# Patient Record
Sex: Female | Born: 1965 | Race: White | Hispanic: No | Marital: Married | Smoking: Current every day smoker
Health system: Southern US, Community
[De-identification: ages and names within clinical notes are randomized; demographics above are authoritative.]

## PROBLEM LIST (undated history)

## (undated) DIAGNOSIS — M150 Primary generalized (osteo)arthritis: Secondary | ICD-10-CM

## (undated) DIAGNOSIS — G43909 Migraine, unspecified, not intractable, without status migrainosus: Secondary | ICD-10-CM

## (undated) DIAGNOSIS — Z9889 Other specified postprocedural states: Secondary | ICD-10-CM

## (undated) DIAGNOSIS — Z86718 Personal history of other venous thrombosis and embolism: Secondary | ICD-10-CM

## (undated) DIAGNOSIS — E119 Type 2 diabetes mellitus without complications: Secondary | ICD-10-CM

## (undated) DIAGNOSIS — I729 Aneurysm of unspecified site: Secondary | ICD-10-CM

## (undated) DIAGNOSIS — F419 Anxiety disorder, unspecified: Secondary | ICD-10-CM

## (undated) DIAGNOSIS — R066 Hiccough: Secondary | ICD-10-CM

## (undated) DIAGNOSIS — C50919 Malignant neoplasm of unspecified site of unspecified female breast: Secondary | ICD-10-CM

## (undated) DIAGNOSIS — G629 Polyneuropathy, unspecified: Secondary | ICD-10-CM

## (undated) DIAGNOSIS — C801 Malignant (primary) neoplasm, unspecified: Secondary | ICD-10-CM

## (undated) DIAGNOSIS — I10 Essential (primary) hypertension: Secondary | ICD-10-CM

## (undated) DIAGNOSIS — K219 Gastro-esophageal reflux disease without esophagitis: Secondary | ICD-10-CM

## (undated) DIAGNOSIS — F32A Depression, unspecified: Secondary | ICD-10-CM

## (undated) DIAGNOSIS — C50412 Malignant neoplasm of upper-outer quadrant of left female breast: Principal | ICD-10-CM

## (undated) DIAGNOSIS — M7500 Adhesive capsulitis of unspecified shoulder: Secondary | ICD-10-CM

## (undated) DIAGNOSIS — J329 Chronic sinusitis, unspecified: Secondary | ICD-10-CM

## (undated) DIAGNOSIS — K5900 Constipation, unspecified: Secondary | ICD-10-CM

## (undated) DIAGNOSIS — R351 Nocturia: Secondary | ICD-10-CM

## (undated) DIAGNOSIS — I82409 Acute embolism and thrombosis of unspecified deep veins of unspecified lower extremity: Secondary | ICD-10-CM

## (undated) DIAGNOSIS — K8020 Calculus of gallbladder without cholecystitis without obstruction: Secondary | ICD-10-CM

## (undated) DIAGNOSIS — M549 Dorsalgia, unspecified: Secondary | ICD-10-CM

## (undated) DIAGNOSIS — I82A11 Acute embolism and thrombosis of right axillary vein: Secondary | ICD-10-CM

## (undated) DIAGNOSIS — K649 Unspecified hemorrhoids: Secondary | ICD-10-CM

## (undated) DIAGNOSIS — G8929 Other chronic pain: Secondary | ICD-10-CM

## (undated) DIAGNOSIS — A6920 Lyme disease, unspecified: Secondary | ICD-10-CM

## (undated) DIAGNOSIS — E7800 Pure hypercholesterolemia, unspecified: Secondary | ICD-10-CM

## (undated) DIAGNOSIS — B3731 Acute candidiasis of vulva and vagina: Secondary | ICD-10-CM

## (undated) DIAGNOSIS — R748 Abnormal levels of other serum enzymes: Secondary | ICD-10-CM

## (undated) DIAGNOSIS — F418 Other specified anxiety disorders: Secondary | ICD-10-CM

## (undated) DIAGNOSIS — R110 Nausea: Secondary | ICD-10-CM

## (undated) DIAGNOSIS — E559 Vitamin D deficiency, unspecified: Secondary | ICD-10-CM

## (undated) DIAGNOSIS — Z794 Long term (current) use of insulin: Secondary | ICD-10-CM

## (undated) DIAGNOSIS — Z1239 Encounter for other screening for malignant neoplasm of breast: Secondary | ICD-10-CM

## (undated) DIAGNOSIS — M47812 Spondylosis without myelopathy or radiculopathy, cervical region: Secondary | ICD-10-CM

## (undated) DIAGNOSIS — G8928 Other chronic postprocedural pain: Secondary | ICD-10-CM

## (undated) DIAGNOSIS — N951 Menopausal and female climacteric states: Secondary | ICD-10-CM

## (undated) DIAGNOSIS — N6489 Other specified disorders of breast: Secondary | ICD-10-CM

## (undated) DIAGNOSIS — F3289 Other specified depressive episodes: Secondary | ICD-10-CM

## (undated) DIAGNOSIS — J0140 Acute pansinusitis, unspecified: Secondary | ICD-10-CM

## (undated) DIAGNOSIS — N6320 Unspecified lump in the left breast, unspecified quadrant: Secondary | ICD-10-CM

## (undated) DIAGNOSIS — M5416 Radiculopathy, lumbar region: Secondary | ICD-10-CM

## (undated) DIAGNOSIS — IMO0002 Reserved for concepts with insufficient information to code with codable children: Secondary | ICD-10-CM

## (undated) DIAGNOSIS — Z170 Estrogen receptor positive status [ER+]: Secondary | ICD-10-CM

## (undated) DIAGNOSIS — M542 Cervicalgia: Secondary | ICD-10-CM

## (undated) DIAGNOSIS — R809 Proteinuria, unspecified: Secondary | ICD-10-CM

## (undated) DIAGNOSIS — F17200 Nicotine dependence, unspecified, uncomplicated: Secondary | ICD-10-CM

## (undated) DIAGNOSIS — E1129 Type 2 diabetes mellitus with other diabetic kidney complication: Secondary | ICD-10-CM

## (undated) DIAGNOSIS — M48062 Spinal stenosis, lumbar region with neurogenic claudication: Secondary | ICD-10-CM

## (undated) DIAGNOSIS — Z421 Encounter for breast reconstruction following mastectomy: Secondary | ICD-10-CM

## (undated) DIAGNOSIS — R928 Other abnormal and inconclusive findings on diagnostic imaging of breast: Secondary | ICD-10-CM

## (undated) DIAGNOSIS — C50012 Malignant neoplasm of nipple and areola, left female breast: Secondary | ICD-10-CM

## (undated) DIAGNOSIS — Z853 Personal history of malignant neoplasm of breast: Secondary | ICD-10-CM

## (undated) DIAGNOSIS — C50912 Malignant neoplasm of unspecified site of left female breast: Secondary | ICD-10-CM

## (undated) DIAGNOSIS — M545 Low back pain, unspecified: Secondary | ICD-10-CM

## (undated) DIAGNOSIS — N641 Fat necrosis of breast: Secondary | ICD-10-CM

## (undated) DIAGNOSIS — N63 Unspecified lump in unspecified breast: Secondary | ICD-10-CM

## (undated) HISTORY — PX: HX BACK SURGERY: SHX140

## (undated) HISTORY — PX: SIMPLE MASTECTOMY: SHX2409

## (undated) HISTORY — PX: SHOULDER ADHESION RELEASE: SHX773

## (undated) HISTORY — PX: HAND SURGERY: SHX662

## (undated) HISTORY — PX: HX GALL BLADDER SURGERY/CHOLE: SHX55

## (undated) HISTORY — PX: HX BREAST RECONSTRUCTION: SHX9

## (undated) HISTORY — DX: Acute embolism and thrombosis of unspecified deep veins of unspecified lower extremity: I82.409

## (undated) HISTORY — DX: Calculus of gallbladder without cholecystitis without obstruction: K80.20

## (undated) HISTORY — DX: Acute embolism and thrombosis of right axillary vein: I82.A11

## (undated) HISTORY — PX: OTHER SURGICAL HISTORY: SHX169

## (undated) HISTORY — DX: Malignant neoplasm of unspecified site of unspecified female breast: C50.919

## (undated) HISTORY — PX: BREAST SURGERY: SHX581

## (undated) HISTORY — PX: TUBAL LIGATION: SHX77

## (undated) HISTORY — PX: BACK SURGERY: SHX140

---

## 1992-10-27 ENCOUNTER — Other Ambulatory Visit (HOSPITAL_COMMUNITY): Payer: Self-pay | Admitting: EXTERNAL

## 2008-10-14 DIAGNOSIS — C50919 Malignant neoplasm of unspecified site of unspecified female breast: Secondary | ICD-10-CM

## 2008-10-14 HISTORY — DX: Malignant neoplasm of unspecified site of unspecified female breast: C50.919

## 2009-10-14 HISTORY — PX: OTHER SURGICAL HISTORY: SHX169

## 2010-04-27 HISTORY — PX: MASTECTOMY: SHX3

## 2010-06-02 ENCOUNTER — Emergency Department (HOSPITAL_COMMUNITY): Admission: EM | Admit: 2010-06-02 | Discharge: 2010-06-03 | Payer: Self-pay | Admitting: Emergency Medicine

## 2010-08-15 ENCOUNTER — Emergency Department (HOSPITAL_COMMUNITY): Admission: EM | Admit: 2010-08-15 | Discharge: 2010-08-15 | Payer: Self-pay | Admitting: Emergency Medicine

## 2010-08-31 ENCOUNTER — Emergency Department (HOSPITAL_COMMUNITY): Admission: EM | Admit: 2010-08-31 | Discharge: 2010-08-31 | Payer: Self-pay | Admitting: Emergency Medicine

## 2010-11-11 ENCOUNTER — Emergency Department (HOSPITAL_COMMUNITY)
Admission: EM | Admit: 2010-11-11 | Discharge: 2010-11-11 | Payer: Self-pay | Source: Home / Self Care | Admitting: Emergency Medicine

## 2010-11-11 LAB — URINALYSIS, ROUTINE W REFLEX MICROSCOPIC
Ketones, ur: 15 mg/dL — AB
Leukocytes, UA: NEGATIVE
Nitrite: NEGATIVE
Urobilinogen, UA: 1 mg/dL (ref 0.0–1.0)
pH: 6 (ref 5.0–8.0)

## 2010-11-11 LAB — URINE MICROSCOPIC-ADD ON

## 2010-11-11 LAB — POCT PREGNANCY, URINE: Preg Test, Ur: NEGATIVE

## 2010-12-11 ENCOUNTER — Emergency Department (HOSPITAL_COMMUNITY)
Admission: EM | Admit: 2010-12-11 | Discharge: 2010-12-11 | Disposition: A | Discharge status: Home / Self Care | Payer: BC Managed Care – PPO | Attending: Emergency Medicine | Admitting: Emergency Medicine

## 2010-12-11 ENCOUNTER — Emergency Department (HOSPITAL_COMMUNITY): Payer: BC Managed Care – PPO

## 2010-12-11 DIAGNOSIS — Z79899 Other long term (current) drug therapy: Secondary | ICD-10-CM | POA: Insufficient documentation

## 2010-12-11 DIAGNOSIS — Z853 Personal history of malignant neoplasm of breast: Secondary | ICD-10-CM | POA: Insufficient documentation

## 2010-12-11 DIAGNOSIS — R071 Chest pain on breathing: Secondary | ICD-10-CM | POA: Insufficient documentation

## 2010-12-11 DIAGNOSIS — E119 Type 2 diabetes mellitus without complications: Secondary | ICD-10-CM | POA: Insufficient documentation

## 2010-12-11 LAB — POCT PREGNANCY, URINE: Preg Test, Ur: NEGATIVE

## 2011-01-23 ENCOUNTER — Emergency Department (HOSPITAL_COMMUNITY): Payer: Managed Care, Other (non HMO)

## 2011-01-23 ENCOUNTER — Emergency Department (HOSPITAL_COMMUNITY)
Admission: EM | Admit: 2011-01-23 | Discharge: 2011-01-23 | Disposition: A | Discharge status: Home / Self Care | Payer: Managed Care, Other (non HMO) | Attending: Emergency Medicine | Admitting: Emergency Medicine

## 2011-01-23 DIAGNOSIS — E860 Dehydration: Secondary | ICD-10-CM | POA: Insufficient documentation

## 2011-01-23 DIAGNOSIS — R0789 Other chest pain: Secondary | ICD-10-CM | POA: Insufficient documentation

## 2011-01-23 DIAGNOSIS — K8020 Calculus of gallbladder without cholecystitis without obstruction: Secondary | ICD-10-CM | POA: Insufficient documentation

## 2011-01-23 DIAGNOSIS — E119 Type 2 diabetes mellitus without complications: Secondary | ICD-10-CM | POA: Insufficient documentation

## 2011-01-23 LAB — DIFFERENTIAL
Basophils Absolute: 0 10*3/uL (ref 0.0–0.1)
Basophils Relative: 0 % (ref 0–1)
Eosinophils Relative: 0 % (ref 0–5)
Monocytes Absolute: 0.8 10*3/uL (ref 0.1–1.0)
Neutrophils Relative %: 68 % (ref 43–77)

## 2011-01-23 LAB — BASIC METABOLIC PANEL
BUN: 4 mg/dL — ABNORMAL LOW (ref 6–23)
CO2: 22 mEq/L (ref 19–32)
Calcium: 9.5 mg/dL (ref 8.4–10.5)
Creatinine, Ser: 0.41 mg/dL (ref 0.4–1.2)
Glucose, Bld: 221 mg/dL — ABNORMAL HIGH (ref 70–99)
Sodium: 139 mEq/L (ref 135–145)

## 2011-01-23 LAB — CBC
MCHC: 36.9 g/dL — ABNORMAL HIGH (ref 30.0–36.0)
Platelets: 275 10*3/uL (ref 150–400)
RDW: 12.2 % (ref 11.5–15.5)

## 2011-01-23 LAB — URINALYSIS, ROUTINE W REFLEX MICROSCOPIC
Bilirubin Urine: NEGATIVE
Hgb urine dipstick: NEGATIVE
Specific Gravity, Urine: 1.019 (ref 1.005–1.030)
Urobilinogen, UA: 1 mg/dL (ref 0.0–1.0)

## 2011-01-23 LAB — POCT CARDIAC MARKERS: Myoglobin, poc: 34.5 ng/mL (ref 12–200)

## 2011-01-23 LAB — URINE MICROSCOPIC-ADD ON

## 2011-01-23 MED ORDER — IOHEXOL 300 MG/ML  SOLN
80.0000 mL | Freq: Once | INTRAMUSCULAR | Status: AC | PRN
  Administered 2011-01-23: 80 mL via INTRAVENOUS

## 2011-04-14 HISTORY — PX: BREAST RECONSTRUCTION: SHX9

## 2011-09-26 ENCOUNTER — Ambulatory Visit (INDEPENDENT_AMBULATORY_CARE_PROVIDER_SITE_OTHER): Payer: Managed Care, Other (non HMO)

## 2011-09-26 DIAGNOSIS — J019 Acute sinusitis, unspecified: Secondary | ICD-10-CM

## 2011-09-26 DIAGNOSIS — Z7721 Contact with and (suspected) exposure to potentially hazardous body fluids: Secondary | ICD-10-CM

## 2012-01-04 ENCOUNTER — Ambulatory Visit (INDEPENDENT_AMBULATORY_CARE_PROVIDER_SITE_OTHER): Payer: Managed Care, Other (non HMO) | Admitting: Family Medicine

## 2012-01-04 DIAGNOSIS — R5383 Other fatigue: Secondary | ICD-10-CM

## 2012-01-04 DIAGNOSIS — R5381 Other malaise: Secondary | ICD-10-CM

## 2012-01-04 DIAGNOSIS — R232 Flushing: Secondary | ICD-10-CM

## 2012-01-04 DIAGNOSIS — N951 Menopausal and female climacteric states: Secondary | ICD-10-CM

## 2012-01-04 DIAGNOSIS — E119 Type 2 diabetes mellitus without complications: Secondary | ICD-10-CM

## 2012-01-04 LAB — POCT CBC
Granulocyte percent: 65.4 % (ref 37–80)
HCT, POC: 46 % (ref 37.7–47.9)
Hemoglobin: 15.5 g/dL (ref 12.2–16.2)
Lymph, poc: 2.9 (ref 0.6–3.4)
MCH, POC: 31.5 pg — AB (ref 27–31.2)
MCHC: 33.7 g/dL (ref 31.8–35.4)
MCV: 93.4 fL (ref 80–97)
MID (cbc): 0.7 (ref 0–0.9)
MPV: 9.1 fL (ref 0–99.8)
POC Granulocyte: 6.9 (ref 2–6.9)
POC LYMPH PERCENT: 27.5 % (ref 10–50)
POC MID %: 7.1 % (ref 0–12)
Platelet Count, POC: 225 10*3/uL (ref 142–424)
RBC: 4.92 M/uL (ref 4.04–5.48)
RDW, POC: 12.6 %
WBC: 10.5 10*3/uL — AB (ref 4.6–10.2)

## 2012-01-04 LAB — POCT GLYCOSYLATED HEMOGLOBIN (HGB A1C): Hemoglobin A1C: 10

## 2012-01-04 MED ORDER — SAXAGLIPTIN-METFORMIN ER 5-1000 MG PO TB24: 1.0000 | ORAL_TABLET | Freq: Every day | ORAL | Status: DC

## 2012-01-04 NOTE — Progress Notes (Signed)
[facility]:  Office Visit  Chief Complaint:  Chief Complaint  Patient presents with  . Fatigue    2 weeks  . Hot Flashes  . Nausea    HPI: Sherry [name] is a 46 y.o. female who complains of : 1. Fatigue x 2 weeks, has prior dx of depression, usually situational. Denies SI/HI 2. T2DM-non compliant due to issues with insurance. Metformin XR 1000 mg daily or something like that , she is unsure. 3. Hot flashes-associated with tearing spells, irritability-LMP Nov 29, 2010.  3. Elevated BP-no dx of HTN, at home 120s/70s  Past Medical History  Diagnosis Date  . Hyperlipidemia   . Hypertension   . Vitamin d deficiency   . Gallstones   . Breast cancer 2010    s/p mastectomy   Past Surgical History  Procedure Date  . Breast surgery   . Breast reconstruction    History   Social History  . Marital Status: Married    Spouse Name: N/A    Number of Children: N/A  . Years of Education: N/A   Social History Main Topics  . Smoking status: Current Everyday Smoker -- 0.5 packs/day    Types: Cigarettes  . Smokeless tobacco: None  . Alcohol Use: None  . Drug Use: None  . Sexually Active: None   Other Topics Concern  . None   Social History Narrative  . None   Family History  Problem Relation Age of Onset  . Diabetes Mother   . Diabetes Sister    No Known Allergies Prior to Admission medications   Not on File     ROS: The patient denies fevers, chills, night sweats, unintentional weight loss, chest pain, palpitations, wheezing, dyspnea on exertion, nausea, vomiting, abdominal pain, dysuria, hematuria, melena, numbness, weakness, or tingling. + hotf lashes  All other systems have been reviewed and were otherwise negative with the exception of those mentioned in the HPI and as above.    PHYSICAL EXAM: Filed Vitals:   01/04/12 1740  BP: 141/90  Pulse: 93  Temp: 98.3 F (36.8 C)  Resp: 16   Filed Vitals:   01/04/12 1740  Height: 5\' 2"  (1.575  m)  Weight: 154 lb (69.854 kg)   Body mass index is 28.17 kg/(m^2).  General: Alert, no acute distress, tearful HEENT:  Normocephalic, atraumatic, oropharynx patent. EOMI, PERRLA, fundoscopic exam nl Cardiovascular:  Regular rate and rhythm, no rubs murmurs or gallops.  No Carotid bruits, radial pulse intact. No pedal edema.  Respiratory: Clear to auscultation bilaterally.  No wheezes, rales, or rhonchi.  No cyanosis, no use of accessory musculature GI: No organomegaly, abdomen is soft and non-tender, positive bowel sounds.  No masses. Skin: No rashes. Neurologic: Facial musculature symmetric.Sensation in Evian Derringer bl intact Psychiatric: Patient is appropriate throughout our interaction. Lymphatic: No cervical lymphadenopathy Musculoskeletal: Gait intact.   LABS: Results for orders placed in visit on 01/04/12  POCT CBC      Component Value Range   WBC 10.5 (*) 4.6 - 10.2 (K/uL)   Lymph, poc 2.9  0.6 - 3.4    POC LYMPH PERCENT 27.5  10 - 50 (%L)   MID (cbc) 0.7  0 - 0.9    POC MID % 7.1  0 - 12 (%M)   POC Granulocyte 6.9  2 - 6.9    Granulocyte percent 65.4  37 - 80 (%G)   RBC 4.92  4.04 - 5.48 (M/uL)   Hemoglobin 15.5  12.2 - 16.2 (  g/dL)   HCT, POC 21.3  08.6 - 47.9 (%)   MCV 93.4  80 - 97 (fL)   MCH, POC 31.5 (*) 27 - 31.2 (pg)   MCHC 33.7  31.8 - 35.4 (g/dL)   RDW, POC 57.8     Platelet Count, POC 225  142 - 424 (K/uL)   MPV 9.1  0 - 99.8 (fL)  POCT GLYCOSYLATED HEMOGLOBIN (HGB A1C)      Component Value Range   Hemoglobin A1C 10.0       EKG/XRAY:   Primary read interpreted by Dr. Conley Rolls at [facility].   ASSESSMENT/PLAN: Encounter Diagnoses  Name Primary?  . Diabetes mellitus Yes  . Fatigue   . 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 Advise to get eyes checked.  Fatigue-check CBC, CMP, TSH Hot flashes-Check LH/FSH Recheck lipids in 3 months when fasting. NO meds for now since HDL was 107 in 08/2011  F/u in 3 months  with appt with PCP.  Hamilton Capri PHUONG, DO 01/04/2012 6:55 PM

## 2012-01-05 ENCOUNTER — Other Ambulatory Visit: Payer: Self-pay | Admitting: Family Medicine

## 2012-01-05 ENCOUNTER — Telehealth: Payer: Self-pay | Admitting: Family Medicine

## 2012-01-05 ENCOUNTER — Telehealth: Payer: Self-pay

## 2012-01-05 DIAGNOSIS — R9389 Abnormal findings on diagnostic imaging of other specified body structures: Secondary | ICD-10-CM

## 2012-01-05 DIAGNOSIS — M791 Myalgia, unspecified site: Secondary | ICD-10-CM

## 2012-01-05 LAB — COMPREHENSIVE METABOLIC PANEL WITH GFR
ALT: 32 U/L (ref 0–35)
AST: 26 U/L (ref 0–37)
Alkaline Phosphatase: 161 U/L — ABNORMAL HIGH (ref 39–117)
BUN: 11 mg/dL (ref 6–23)
Creat: 0.54 mg/dL (ref 0.50–1.10)
Total Bilirubin: 0.5 mg/dL (ref 0.3–1.2)

## 2012-01-05 LAB — LUTEINIZING HORMONE: LH: 12.8 m[IU]/mL

## 2012-01-05 LAB — COMPREHENSIVE METABOLIC PANEL
Albumin: 4.4 g/dL (ref 3.5–5.2)
CO2: 25 mEq/L (ref 19–32)
Calcium: 9.8 mg/dL (ref 8.4–10.5)
Chloride: 103 mEq/L (ref 96–112)
Glucose, Bld: 378 mg/dL — ABNORMAL HIGH (ref 70–99)
Potassium: 4.2 mEq/L (ref 3.5–5.3)
Sodium: 137 mEq/L (ref 135–145)
Total Protein: 7.3 g/dL (ref 6.0–8.3)

## 2012-01-05 LAB — FOLLICLE STIMULATING HORMONE: FSH: 20 m[IU]/mL

## 2012-01-05 LAB — TSH: TSH: 2.042 u[IU]/mL (ref 0.350–4.500)

## 2012-01-05 MED ORDER — TRAMADOL HCL 50 MG PO TABS: 50.0000 mg | ORAL_TABLET | Freq: Three times a day (TID) | ORAL | Status: DC | PRN

## 2012-01-05 NOTE — Telephone Encounter (Signed)
Filled rx for tramadol for msk pain [facility] [place]. Used to be on vicodin but I switched her because it might be better just for generalized msk pain.

## 2012-01-05 NOTE — Telephone Encounter (Signed)
PATIENT WAS SEEN YESTERDAY AND WE CALLED HER IN HER DIABETIES MEDICINE AND WERE SUPPOSED TO CALL IN HER TRAMODALL ALSO.  SHE GOT HER DIABETIES MED, BUT THEY NEVER RECEIVED ANYTHING ON THE TRAMODALL.    [facility] [address]

## 2012-01-07 ENCOUNTER — Other Ambulatory Visit: Payer: Self-pay | Admitting: *Deleted

## 2012-01-07 MED ORDER — SAXAGLIPTIN-METFORMIN ER 5-1000 MG PO TB24: 1.0000 | ORAL_TABLET | Freq: Every day | ORAL | Status: DC

## 2012-01-09 ENCOUNTER — Ambulatory Visit (INDEPENDENT_AMBULATORY_CARE_PROVIDER_SITE_OTHER): Payer: Managed Care, Other (non HMO) | Admitting: Internal Medicine

## 2012-01-09 VITALS — BP 144/92 | HR 81 | Temp 97.9°F | Resp 18 | Ht 62.0 in | Wt 152.0 lb

## 2012-01-09 DIAGNOSIS — H9209 Otalgia, unspecified ear: Secondary | ICD-10-CM

## 2012-01-09 LAB — POCT CBC
Granulocyte percent: 58 %G (ref 37–80)
HCT, POC: 46.9 % (ref 37.7–47.9)
MCV: 92.8 fL (ref 80–97)
Platelet Count, POC: 244 10*3/uL (ref 142–424)
RBC: 5.05 M/uL (ref 4.04–5.48)

## 2012-01-09 LAB — GLUCOSE, POCT (MANUAL RESULT ENTRY): POC Glucose: 265

## 2012-01-09 MED ORDER — AMOXICILLIN-POT CLAVULANATE 875-125 MG PO TABS: 1.0000 | ORAL_TABLET | Freq: Two times a day (BID) | ORAL | Status: AC

## 2012-01-09 MED ORDER — CEFTRIAXONE SODIUM 1 G IJ SOLR
1.0000 g | Freq: Once | INTRAMUSCULAR | Status: AC
  Administered 2012-01-09: 1 g via INTRAMUSCULAR

## 2012-01-09 MED ORDER — KETOROLAC TROMETHAMINE 30 MG/ML IJ SOLN
30.0000 mg | Freq: Once | INTRAMUSCULAR | Status: AC
  Administered 2012-01-09: 30 mg via INTRAMUSCULAR

## 2012-01-09 MED ORDER — HYDROCODONE-ACETAMINOPHEN 5-325 MG PO TABS: 1.0000 | ORAL_TABLET | Freq: Four times a day (QID) | ORAL | Status: AC | PRN

## 2012-01-09 NOTE — Patient Instructions (Signed)
Get plenty of rest. Drink at least 64 ounces of water daily.

## 2012-01-09 NOTE — Progress Notes (Signed)
  Subjective:    Patient ID: Sherry [name], female    DOB: 1965/12/01, 46 y.o.   MRN: [id number]  HPI Presents accompanied by her husband, c/o severe left ear pain.  Woke her from sleep last night.  Notes some left sided ha with left side lying for several days.    Also, husband brings up pain in hip and legs for some time.  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  Was evaluated at a local orthopedics office and was told she'd have an MRI but reports she never heard anything else about it.  Unable to walk into [employer] from parking lot due to pain.  Seems worse since mastectomy.  Requests handicap placard.  Restarted Kombiglyze XR only 3 days ago (refilled 3/23).  Tramadol ineffective for her pain, causes insomnia.   Review of Systems As above.    Objective:   Physical Exam Vital signs noted. Well-developed, well nourished WF who is awake, alert and oriented, in obvious discomfort, crying. Husband is attentive to her, trying to soothe her HEENT: [place]/AT, PERRL, EOMI.  Sclera and conjunctiva are clear.  Mild fullness around the left ear and mastoid.  No erythema, induration.  Exquisite tenderness of the periauricular area, pinna and tragus.  No erythema/edema of the canal, moderate cerumen.  The TM appears normal.  On the Right, EAC is patent, TM is normal in appearance. Nasal mucosa is pink and moist. OP is clear. Neck: supple, non-tender, no lymphadenopathy, thyromegaly. Heart: RRR, no murmur Lungs: CTA Skin: warm and dry without rash.  Results for orders placed in visit on 01/09/12  POCT CBC      Component Value Range   WBC 10.6 (*) 4.6 - 10.2 (K/uL)   Lymph, poc 3.7 (*) 0.6 - 3.4    POC LYMPH PERCENT 35.3  10 - 50 (%L)   MID (cbc) 0.7  0 - 0.9    POC MID % 6.7  0 - 12 (%M)   POC Granulocyte 6.1  2 - 6.9    Granulocyte percent 58.0  37 - 80 (%G)   RBC 5.05  4.04 - 5.48 (M/uL)   Hemoglobin 15.5  12.2 - 16.2 (g/dL)   HCT, POC 40.9  81.1 - 47.9 (%)   MCV 92.8  80 - 97 (fL)    MCH, POC 30.7  27 - 31.2 (pg)   MCHC 33.0  31.8 - 35.4 (g/dL)   RDW, POC 91.4     Platelet Count, POC 244  142 - 424 (K/uL)   MPV 10.1  0 - 99.8 (fL)  GLUCOSE, POCT (MANUAL RESULT ENTRY)      Component Value Range   POC Glucose 265           Assessment & Plan:   1. Ear pain  POCT CBC, ketorolac (TORADOL) 30 MG/ML injection 30 mg, cefTRIAXone (ROCEPHIN) injection 1 g, amoxicillin-clavulanate (AUGMENTIN) 875-125 MG per tablet  2. Diabetes mellitus  POCT glucose (manual entry). Continue working on improved glucose control.    3. Back Pain.  Handicap placard (temporary, 6 months) provided    D/C Tramadol due to ineffectiveness and insomnia    Norco 5/325 1 PO Q6 hours, prn pain, #30 no refills    Once acute illness resolved, will pursue continued evaluation, which may    include MRI. Re-evaluate tomorrow morning.  May need urgent referral to ENT if not improved. Seen with Dr. Perrin Maltese.

## 2012-01-17 ENCOUNTER — Encounter: Payer: Self-pay | Admitting: Family Medicine

## 2012-01-29 ENCOUNTER — Ambulatory Visit: Payer: Managed Care, Other (non HMO)

## 2012-01-29 ENCOUNTER — Ambulatory Visit (INDEPENDENT_AMBULATORY_CARE_PROVIDER_SITE_OTHER): Payer: Managed Care, Other (non HMO) | Admitting: Family Medicine

## 2012-01-29 VITALS — BP 157/94 | HR 80 | Temp 98.3°F | Resp 18 | Ht 63.0 in | Wt 149.0 lb

## 2012-01-29 DIAGNOSIS — E119 Type 2 diabetes mellitus without complications: Secondary | ICD-10-CM

## 2012-01-29 DIAGNOSIS — K8020 Calculus of gallbladder without cholecystitis without obstruction: Secondary | ICD-10-CM

## 2012-01-29 DIAGNOSIS — R0789 Other chest pain: Secondary | ICD-10-CM

## 2012-01-29 DIAGNOSIS — R6521 Severe sepsis with septic shock: Secondary | ICD-10-CM

## 2012-01-29 DIAGNOSIS — A419 Sepsis, unspecified organism: Secondary | ICD-10-CM

## 2012-01-29 LAB — POCT GLYCOSYLATED HEMOGLOBIN (HGB A1C): Hemoglobin A1C: 9.7

## 2012-01-29 LAB — GLUCOSE, POCT (MANUAL RESULT ENTRY): POC Glucose: 190

## 2012-01-29 MED ORDER — OXAPROZIN 600 MG PO TABS: 600.0000 mg | ORAL_TABLET | Freq: Every day | ORAL | Status: DC

## 2012-01-29 MED ORDER — CYCLOBENZAPRINE HCL 5 MG PO TABS
ORAL_TABLET | ORAL | Status: DC
Start: 2012-01-29 — End: 2012-03-19

## 2012-01-29 MED ORDER — HYDROCODONE-ACETAMINOPHEN 10-325 MG PO TABS: 1.0000 | ORAL_TABLET | Freq: Three times a day (TID) | ORAL | Status: AC | PRN

## 2012-01-29 NOTE — Progress Notes (Signed)
Subjective: 46 year old female with history of right chest wall pain. This episode began yesterday after she and her daughter were playing bingo. She developed chest wall pain radiating around to the right back. Any movement the cause excruciating pain. She rates the pain 8/10 on a pain scale. She did not develop any nausea or vomiting or shortness of breath. She has no history of heart disease. She has a diabetic, who has poor control last time when she was out of her medications. She had a breast cancer 3 years ago with mastectomy, chemotherapy and radiation. She had a great deal of radiation burn scarring of her right chest. She ended up having a breast reconstruction done, but the tight skin cause this to excessive tightness of the reconstruction and implant. She also had a left reduction mammoplasty. Patient does also give a history of having a gallstone so we need to keep that in mind.  Objective: Alert and oriented, somewhat anxious. Neck supple without nodes. Chest is deformed from the old surgery and radiation and right-sided implant. There is some variation of the colors of the skin from the radiation. Implant is a little bit medial appearing scar tissues more lateral. The excision of the scar radiates around to the right mid back. She has tenderness over chest wall, more in the axillary [place] area. This abdomen soft without masses or tenderness. Heart regular without any murmurs.  EKG was done and was normal.  Assessment: Chest wall pain secondary to radiation and scar tissue History of breast cancer  Diabetes mellitus Gallstone history  Plan: Chest x-ray, glucose, hemoglobin A1c.  [facility] reading (PRIMARY) by  Dr. Alwyn Ren  increased markings in right lung consistent with old radiation scarring post mastectomy.  Results for orders placed in visit on 01/29/12  GLUCOSE, POCT (MANUAL RESULT ENTRY)      Component Value Range   POC Glucose 190    POCT GLYCOSYLATED HEMOGLOBIN (HGB A1C)   Component Value Range   Hemoglobin A1C 9.7     .

## 2012-01-29 NOTE — Patient Instructions (Signed)
Use the pain medications as necessary. Save the hydrocodone for severe pain only. Use the relaxants (Flexeril and (on an as needed basis.

## 2012-01-30 LAB — COMPREHENSIVE METABOLIC PANEL
ALT: 22 U/L (ref 0–35)
AST: 20 U/L (ref 0–37)
Alkaline Phosphatase: 135 U/L — ABNORMAL HIGH (ref 39–117)
Calcium: 9.3 mg/dL (ref 8.4–10.5)
Chloride: 106 mEq/L (ref 96–112)
Creat: 0.4 mg/dL — ABNORMAL LOW (ref 0.50–1.10)
Potassium: 4 mEq/L (ref 3.5–5.3)

## 2012-01-31 ENCOUNTER — Encounter: Payer: Self-pay | Admitting: Family Medicine

## 2012-02-01 ENCOUNTER — Encounter: Payer: Self-pay | Admitting: Family Medicine

## 2012-02-01 ENCOUNTER — Telehealth: Payer: Self-pay | Admitting: Family Medicine

## 2012-02-05 ENCOUNTER — Ambulatory Visit
Admission: RE | Admit: 2012-02-05 | Discharge: 2012-02-05 | Disposition: A | Discharge status: Home / Self Care | Payer: Managed Care, Other (non HMO) | Source: Ambulatory Visit | Attending: Family Medicine | Admitting: Family Medicine

## 2012-02-05 DIAGNOSIS — R9389 Abnormal findings on diagnostic imaging of other specified body structures: Secondary | ICD-10-CM

## 2012-02-06 ENCOUNTER — Telehealth: Payer: Self-pay

## 2012-02-06 NOTE — Telephone Encounter (Signed)
.  [facility] The patient called regarding results of her Head CT done 02/05/12. Please call patient at [phone].

## 2012-02-06 NOTE — Telephone Encounter (Signed)
Spoke with patient and let her know that results are not back yet.

## 2012-02-08 ENCOUNTER — Telehealth: Payer: Self-pay

## 2012-02-08 NOTE — Telephone Encounter (Signed)
Patient is calling for the third day in a row for her scan results.  She is scared and concerned.  Please call.

## 2012-02-08 NOTE — Telephone Encounter (Signed)
Pt is calling for ct results, states has called 2x and scan was done on wed Please call pt at [phone]

## 2012-02-08 NOTE — Telephone Encounter (Signed)
Ryan, pls review so I can advise patient.

## 2012-02-08 NOTE — Telephone Encounter (Signed)
No acute abnormalities. Chronic gall stones, no change from prior. Unchanged calcification of the liver.  Sherry [name]

## 2012-02-08 NOTE — Telephone Encounter (Signed)
Patient notified and reassured 

## 2012-02-10 ENCOUNTER — Telehealth: Payer: Self-pay | Admitting: Family Medicine

## 2012-02-10 NOTE — Telephone Encounter (Signed)
I left a message on the patient's machine that I was sorry that I could not reach her back, but that that that the PA had already called to her about the CT were poor initiate further comments or questions she can feel free to call me back about it.

## 2012-02-11 NOTE — Telephone Encounter (Signed)
Open by error

## 2012-03-19 ENCOUNTER — Telehealth: Payer: Self-pay

## 2012-03-19 DIAGNOSIS — R0789 Other chest pain: Secondary | ICD-10-CM

## 2012-03-19 MED ORDER — HYDROCODONE-ACETAMINOPHEN 10-325 MG PO TABS: 1.0000 | ORAL_TABLET | Freq: Four times a day (QID) | ORAL | Status: AC | PRN

## 2012-03-19 MED ORDER — CYCLOBENZAPRINE HCL 5 MG PO TABS: ORAL_TABLET | ORAL | Status: DC

## 2012-03-19 NOTE — Telephone Encounter (Signed)
I spoke to patient and she wants Rx for [facility]odin [facility] pt phone [phone]

## 2012-03-19 NOTE — Telephone Encounter (Signed)
Addended by: Sondra Barges on: 03/19/2012 05:08 PM   Modules accepted: Orders

## 2012-03-19 NOTE — Telephone Encounter (Signed)
Called in Rx to pharmacist and notified pt it has been done.

## 2012-03-19 NOTE — Telephone Encounter (Signed)
done

## 2012-03-19 NOTE — Telephone Encounter (Signed)
Pt is at pharm and she p/up Rx and it was for the muscle relaxant, not the pain medicine. She wondered if we can call pharm while she is there and straighten it out. She didn't realize until she got to the car that it was for the wrong one.

## 2012-03-19 NOTE — Telephone Encounter (Signed)
Done and sent in 

## 2012-03-19 NOTE — Telephone Encounter (Signed)
Called pt to advise [facility]

## 2012-03-19 NOTE — Telephone Encounter (Signed)
Pt states she is out of pain meds for her hip/back. Cannot remember name of med. Requesting refill.  Best: [phone]

## 2012-04-03 ENCOUNTER — Telehealth: Payer: Self-pay

## 2012-04-03 NOTE — Telephone Encounter (Signed)
Pt is requesting to see if Dr. Alwyn Ren can write a statement stating pt condition about having hard time climbing stairs and the apartment complex will allow her to move to bottom floor.

## 2012-04-05 NOTE — Telephone Encounter (Signed)
I have not seen her in several months and feel she needs seen to write such a note with documentation of current condition qualifying her for such (if she does qualify).

## 2012-04-06 NOTE — Telephone Encounter (Signed)
Notified pt of need for re-eval for statement. Pt agreed and will come in to see Dr Alwyn Ren on Friday.

## 2012-04-10 ENCOUNTER — Telehealth: Payer: Self-pay

## 2012-04-10 ENCOUNTER — Ambulatory Visit (INDEPENDENT_AMBULATORY_CARE_PROVIDER_SITE_OTHER): Payer: Managed Care, Other (non HMO) | Admitting: Family Medicine

## 2012-04-10 VITALS — BP 165/89 | HR 77 | Temp 98.4°F | Resp 18 | Ht 62.0 in | Wt 151.0 lb

## 2012-04-10 DIAGNOSIS — M545 Low back pain, unspecified: Secondary | ICD-10-CM

## 2012-04-10 DIAGNOSIS — M5416 Radiculopathy, lumbar region: Secondary | ICD-10-CM

## 2012-04-10 DIAGNOSIS — E119 Type 2 diabetes mellitus without complications: Secondary | ICD-10-CM

## 2012-04-10 DIAGNOSIS — IMO0002 Reserved for concepts with insufficient information to code with codable children: Secondary | ICD-10-CM

## 2012-04-10 MED ORDER — PREDNISONE 20 MG PO TABS: ORAL_TABLET | ORAL | Status: AC

## 2012-04-10 MED ORDER — PREDNISONE 20 MG PO TABS: ORAL_TABLET | ORAL | Status: DC

## 2012-04-10 MED ORDER — HYDROCODONE-ACETAMINOPHEN 5-325 MG PO TABS: 1.0000 | ORAL_TABLET | Freq: Four times a day (QID) | ORAL | Status: AC | PRN

## 2012-04-10 NOTE — Patient Instructions (Addendum)
MRI of back will be scheduled.  If you do not hear from this in next 5-6 days call and ask to see if it is in progress.  Keep close check on sugar while on the prednisone.  Stop the prednisone if running over glucose 220.

## 2012-04-10 NOTE — Telephone Encounter (Signed)
[facility] Clarified taper of 3 tab x 2 days, 2 tabs x 2 days then 1 tab x 2 days after checking w/Dr Alwyn Ren. Changing sig in EPIC for clarity.

## 2012-04-10 NOTE — Progress Notes (Signed)
Subjective: Patient has been having problems with pain down her right leg since she was here in October. She was sent to Dr. Yevette Edwards. He evaluated her and consider physical therapy. He was going to schedule an MRI per his note, but somehow that did not get done. He is continued to hurt. She has hard time her sister upstairs apartment, and would like a letter of documentation to see if she can get a downstairs apartment. She infrequently takes the pain medicines because she doesn't like to take pain medicines. She is diabetic and, and her last sugar was in the 115 range.  We did not get the reports from the back Dr.  We called for a copy.  The PT said she had a leg length discrepancy  Objective: Back is not particularly tender. Mild tenderness over the right SI region. Straight leg raising test mildly positive on right. Feels a pain in her hip and thigh and and in there is a skip area and the pain is down low in the leg.  Assessment: Low back pain with lumbar radiculopathy, persisted for 9 months. Orthopedist suspects possible lumbar spinal stenosis. Diabetes  Plan: We'll give her prednisone but she needs to watch her sugar closely. Will go ahead and schedule her for an MRI of the back and proceed from there. We'll probably need to send her back to a back specialist after we get this do not think she is a candidate to go to the chronic pain clinic yet.   We checked State databank on pain medicine use.

## 2012-04-14 ENCOUNTER — Ambulatory Visit
Admission: RE | Admit: 2012-04-14 | Discharge: 2012-04-14 | Disposition: A | Discharge status: Home / Self Care | Payer: Managed Care, Other (non HMO) | Source: Ambulatory Visit | Attending: Family Medicine | Admitting: Family Medicine

## 2012-04-14 DIAGNOSIS — M545 Low back pain: Secondary | ICD-10-CM

## 2012-04-17 ENCOUNTER — Other Ambulatory Visit: Payer: Self-pay

## 2012-04-17 DIAGNOSIS — IMO0002 Reserved for concepts with insufficient information to code with codable children: Secondary | ICD-10-CM

## 2012-04-26 ENCOUNTER — Telehealth: Payer: Self-pay

## 2012-04-26 NOTE — Telephone Encounter (Signed)
PT SATES THAT SHE WAS REFERRED TO [facility] AND SHE NOW NEEDS A COPY OF HER MRI TO TAKE WITH HER. BEST# [phone]

## 2012-04-26 NOTE — Telephone Encounter (Signed)
LMOM that a copy of the MRI report is up front for p/u

## 2012-05-08 ENCOUNTER — Ambulatory Visit (INDEPENDENT_AMBULATORY_CARE_PROVIDER_SITE_OTHER): Payer: Managed Care, Other (non HMO) | Admitting: Emergency Medicine

## 2012-05-08 VITALS — BP 146/88 | HR 99 | Temp 98.1°F | Resp 16 | Ht 63.0 in | Wt 153.0 lb

## 2012-05-08 DIAGNOSIS — Z01818 Encounter for other preprocedural examination: Secondary | ICD-10-CM

## 2012-05-08 NOTE — Progress Notes (Signed)
   Date:  05/08/2012   Name:  Sherry [name]   DOB:  [birth year]   MRN:  [id number]  PCP:  No primary provider on file.    Chief Complaint: Pre-op Exam and Diabetes   History of Present Illness:  Sherry [name] is a 46 y.o. very pleasant female patient who presents with the following:  Requires clearance for lumbar laminectomy.  History of breast cancer with mastectomy 2012.  Cholecystectomy,  NIDDM, tobacco abuse.  Premenopausal.  There is no problem list on file for this patient.   Past Medical History  Diagnosis Date  . Hyperlipidemia   . Hypertension   . Vitamin d deficiency   . Gallstones   . Breast cancer 2010    s/p mastectomy    Past Surgical History  Procedure Date  . Breast surgery   . Breast reconstruction     History  Substance Use Topics  . Smoking status: Current Everyday Smoker -- 0.5 packs/day    Types: Cigarettes  . Smokeless tobacco: Not on file  . Alcohol Use: Not on file    Family History  Problem Relation Age of Onset  . Diabetes Mother   . Diabetes Sister     No Known Allergies  Medication list has been reviewed and updated.  Current Outpatient Prescriptions on File Prior to Visit  Medication Sig Dispense Refill  . cyclobenzaprine (FLEXERIL) 5 MG tablet Take one tablet up to 3 times daily as needed for tightness of chest wall  30 tablet  2  . oxaprozin (DAYPRO) 600 MG tablet Take 1 tablet (600 mg total) by mouth daily. Take 1 twice daily with food for chest wall pain as needed  60 tablet  1  . Saxagliptin-Metformin 02-999 MG TB24 Take 1 tablet by mouth daily.  90 tablet  1    Review of Systems:  As per HPI, otherwise negative.    Physical Examination: Filed Vitals:   05/08/12 1101  BP: 146/88  Pulse: 99  Temp: 98.1 F (36.7 C)  Resp: 16   Filed Vitals:   05/08/12 1101  Height: 5\' 3"  (1.6 m)  Weight: 153 lb (69.4 kg)   Body mass index is 27.10 kg/(m^2). Ideal Body Weight: Weight in (lb) to have BMI = 25: 140.8   GEN:  WDWN, NAD, Non-toxic, A & O x 3 HEENT: Atraumatic, Normocephalic. Neck supple. No masses, No LAD. Ears and Nose: No external deformity. CV: RRR, No M/G/R. No JVD. No thrill. No extra heart sounds. PULM: CTA B, no wheezes, crackles, rhonchi. No retractions. No resp. distress. No accessory muscle use. ABD: S, NT, ND, +BS. No rebound. No HSM. EXTR: No c/c/e NEURO Normal gait.  PSYCH: Normally interactive. Conversant. Not depressed or anxious appearing.  Calm demeanor.    Assessment and Plan:   Fasting sugar 83 this morning.  NIDDM Presurgical clearance  Carmelina Dane, MD

## 2012-05-18 ENCOUNTER — Ambulatory Visit (INDEPENDENT_AMBULATORY_CARE_PROVIDER_SITE_OTHER): Payer: Managed Care, Other (non HMO) | Admitting: Family Medicine

## 2012-05-18 VITALS — BP 118/74 | HR 68 | Temp 98.1°F | Resp 16 | Ht 62.0 in | Wt 149.0 lb

## 2012-05-18 DIAGNOSIS — J329 Chronic sinusitis, unspecified: Secondary | ICD-10-CM

## 2012-05-18 MED ORDER — CEFDINIR 300 MG PO CAPS: 300.0000 mg | ORAL_CAPSULE | Freq: Two times a day (BID) | ORAL | Status: DC

## 2012-05-18 NOTE — Progress Notes (Signed)
[facility] [address], [place] [address] [phone]  Date:  05/18/2012   Name:  Sherry [name]   DOB:  November 04, 1965   MRN:  [id number]  PCP:  Pcp Not In System    Chief Complaint: Sinusitis and Fatigue   History of Present Illness:  Sherry [name] is a 46 y.o. very pleasant female patient who presents with the following:  She is here with a possible sinus infection.  She notes fatigue, runny nose and copious nasal discharge, and also intemittent congestion. She also has sinus pressure and pain, and a headache.  She has some cough- productive of clear mucus.  She has not checked her temperature, but has noted chills.   She has tried some OTC medication for allergies.   She will have surgery to fix her herniated lumbar disc later on this month.    There is no problem list on file for this patient.   Past Medical History  Diagnosis Date  . Hyperlipidemia   . Hypertension   . Vitamin d deficiency   . Gallstones   . Breast cancer 2010    s/p mastectomy    Past Surgical History  Procedure Date  . Breast surgery   . Breast reconstruction     History  Substance Use Topics  . Smoking status: Current Everyday Smoker -- 0.5 packs/day    Types: Cigarettes  . Smokeless tobacco: Not on file  . Alcohol Use: Not on file    Family History  Problem Relation Age of Onset  . Diabetes Mother   . Diabetes Sister     No Known Allergies  Medication list has been reviewed and updated.  Current Outpatient Prescriptions on File Prior to Visit  Medication Sig Dispense Refill  . cyclobenzaprine (FLEXERIL) 5 MG tablet Take one tablet up to 3 times daily as needed for tightness of chest wall  30 tablet  2  . HYDROcodone-acetaminophen (NORCO/VICODIN) 5-325 MG per tablet Take 1 tablet by mouth 4 (four) times daily as needed.      [name] oxaprozin (DAYPRO) 600 MG tablet Take 1 tablet (600 mg total) by mouth daily. Take 1 twice daily with food for chest wall pain as  needed  60 tablet  1  . Saxagliptin-Metformin 02-999 MG TB24 Take 1 tablet by mouth daily.  90 tablet  1    Review of Systems:  As per HPI- otherwise negative.   Physical Examination: Filed Vitals:   05/18/12 1534  BP: 118/74  Pulse: 68  Temp: 98.1 F (36.7 C)  Resp: 16   Filed Vitals:   05/18/12 1534  Height: 5\' 2"  (1.575 m)  Weight: 149 lb (67.586 kg)   Body mass index is 27.25 kg/(m^2). Ideal Body Weight: Weight in (lb) to have BMI = 25: 136.4   GEN: WDWN, NAD, Non-toxic, A & O x 3 HEENT: Atraumatic, Normocephalic. Neck supple. No masses, No LAD.  Frontal sinuses are tender, right TM injected, left TM wnl, oropharynx wnl.  Nasal cavity congested Ears and Nose: No external deformity. CV: RRR, No M/G/R. No JVD. No thrill. No extra heart sounds. PULM: CTA B, no wheezes, crackles, rhonchi. No retractions. No resp. distress. No accessory muscle use. EXTR: No c/c/e NEURO Normal gait.  PSYCH: Normally interactive. Conversant. Not depressed or anxious appearing.  Calm demeanor.    Assessment and Plan: 1. Sinusitis  cefdinir (OMNICEF) 300 MG capsule   Patient (or parent if minor) instructed to return to clinic  or call if not better in 3-4 day(s).  Sooner if worse.      Abbe Amsterdam, MD

## 2012-05-21 ENCOUNTER — Encounter (HOSPITAL_COMMUNITY): Payer: Self-pay | Admitting: Pharmacy Technician

## 2012-05-25 ENCOUNTER — Encounter: Payer: Self-pay | Admitting: Family Medicine

## 2012-05-25 ENCOUNTER — Ambulatory Visit (INDEPENDENT_AMBULATORY_CARE_PROVIDER_SITE_OTHER): Payer: Managed Care, Other (non HMO) | Admitting: Family Medicine

## 2012-05-25 VITALS — BP 126/74 | HR 88 | Temp 98.6°F | Resp 16 | Ht 62.0 in | Wt 152.0 lb

## 2012-05-25 DIAGNOSIS — L0390 Cellulitis, unspecified: Secondary | ICD-10-CM

## 2012-05-25 DIAGNOSIS — W57XXXA Bitten or stung by nonvenomous insect and other nonvenomous arthropods, initial encounter: Secondary | ICD-10-CM

## 2012-05-25 DIAGNOSIS — L0291 Cutaneous abscess, unspecified: Secondary | ICD-10-CM

## 2012-05-25 DIAGNOSIS — T148 Other injury of unspecified body region: Secondary | ICD-10-CM

## 2012-05-25 MED ORDER — DOXYCYCLINE HYCLATE 100 MG PO TABS: 100.0000 mg | ORAL_TABLET | Freq: Two times a day (BID) | ORAL | Status: AC

## 2012-05-25 NOTE — Progress Notes (Signed)
46 yo woman with 1 day h/o skin infection right inner thigh and left hip.  She has upcoming surgery in 10 days on her back  Objective:  NAD Right proximal medial thigh shows a 1 cm area of intense erythema with central pustule and surrounding 3 cm pink cellulitis. Left hip has 1 cm erythema  Assessment:  Insect bite, cellulitis

## 2012-05-26 DIAGNOSIS — M5416 Radiculopathy, lumbar region: Secondary | ICD-10-CM | POA: Diagnosis present

## 2012-05-27 LAB — ROCKY MTN SPOTTED FVR AB, IGM-BLOOD: ROCKY MTN SPOTTED FEVER, IGM: 0.39 IV

## 2012-05-27 LAB — B. BURGDORFI ANTIBODIES: B burgdorferi Ab IgG+IgM: 1.55 {ISR} — ABNORMAL HIGH

## 2012-05-28 LAB — B. BURGDORFI ANTIBODIES BY WB
B burgdorferi IgG Abs (IB): NEGATIVE
B burgdorferi IgM Abs (IB): NEGATIVE

## 2012-05-28 NOTE — Pre-Procedure Instructions (Signed)
22 Sherry [name]  05/28/2012   Your procedure is scheduled on:  Thursday, August 22nd  Report to [facility] at 1000 AM.  Call this number if you have problems the morning of surgery: [phone]   Remember:   Do not eat food or drink:After Midnight.  Take these medicines the morning of surgery with A SIP OF WATER: vicodin if needed   Do not wear jewelry, make-up or nail polish.  Do not wear lotions, powders, or perfumes..  Do not shave 48 hours prior to surgery. Men may shave face and neck.  Do not bring valuables to the hospital.  Contacts, dentures or bridgework may not be worn into surgery.  Leave suitcase in the car. After surgery it may be brought to your room.  For patients admitted to the hospital, checkout time is 11:00 AM the day of discharge.   Special Instructions: CHG Shower Use Special Wash: 1/2 bottle night before surgery and 1/2 bottle morning of surgery.   Please read over the following fact sheets that you were given: Pain Booklet, Coughing and Deep Breathing, MRSA Information and Surgical Site Infection Prevention

## 2012-05-29 ENCOUNTER — Encounter (HOSPITAL_COMMUNITY): Payer: Self-pay

## 2012-05-29 ENCOUNTER — Encounter (HOSPITAL_COMMUNITY)
Admission: RE | Admit: 2012-05-29 | Discharge: 2012-05-29 | Disposition: A | Discharge status: Home / Self Care | Payer: Managed Care, Other (non HMO) | Source: Ambulatory Visit | Attending: Orthopedic Surgery | Admitting: Orthopedic Surgery

## 2012-05-29 ENCOUNTER — Encounter (HOSPITAL_COMMUNITY)
Admission: RE | Admit: 2012-05-29 | Discharge: 2012-05-29 | Disposition: A | Discharge status: Home / Self Care | Payer: Managed Care, Other (non HMO) | Source: Ambulatory Visit | Attending: Physician Assistant | Admitting: Physician Assistant

## 2012-05-29 HISTORY — DX: Dorsalgia, unspecified: M54.9

## 2012-05-29 HISTORY — DX: Unspecified hemorrhoids: K64.9

## 2012-05-29 HISTORY — DX: Other chronic pain: G89.29

## 2012-05-29 HISTORY — DX: Chronic sinusitis, unspecified: J32.9

## 2012-05-29 HISTORY — DX: Personal history of other venous thrombosis and embolism: Z86.718

## 2012-05-29 HISTORY — DX: Lyme disease, unspecified: A69.20

## 2012-05-29 HISTORY — DX: Nocturia: R35.1

## 2012-05-29 HISTORY — DX: Constipation, unspecified: K59.00

## 2012-05-29 LAB — BASIC METABOLIC PANEL
BUN: 10 mg/dL (ref 6–23)
Calcium: 9.5 mg/dL (ref 8.4–10.5)
Creatinine, Ser: 0.39 mg/dL — ABNORMAL LOW (ref 0.50–1.10)
GFR calc Af Amer: >90 mL/min (ref >90)
GFR calc non Af Amer: >90 mL/min (ref >90)
Glucose, Bld: 88 mg/dL (ref 70–99)

## 2012-05-29 LAB — CBC
Hemoglobin: 14.7 g/dL (ref 12.0–15.0)
MCH: 32.5 pg (ref 26.0–34.0)
MCHC: 35.5 g/dL (ref 30.0–36.0)
RDW: 12.6 % (ref 11.5–15.5)

## 2012-05-29 LAB — SURGICAL PCR SCREEN
MRSA, PCR: NEGATIVE
Staphylococcus aureus: NEGATIVE

## 2012-05-29 NOTE — Progress Notes (Signed)
Pt doesn't have a cardiologist  Denies ever having a heart cath/stress test  Echo done at [facility] in 2011 to request records  [facility] [place]-Dr.Hooper  EKG and CXR in epic from 01/29/12

## 2012-05-29 NOTE — Progress Notes (Signed)
Notified Sherry [name] of diagnosis of Lyme's Disease and that pt is on Doxycycline--per her she doesn't need to review this chart

## 2012-06-01 NOTE — H&P (Signed)
Sherry [name] 05/29/2012 4:42 PM Location: [facility] Patient #: [id number] DOB: 1966/08/03 Married / Language: [place] / Race: White Female   History of Present Illness(Sherry Orban Dierdre Highman, PA-C; 06/01/2012 9:24 AM) The patient is a 45 year old female who comes in today for a preoperative History and Physical. The patient is scheduled for a L4-5 bilateral hemilaminectomy and disectomy for lumbar radiculopathy (for bilateral leg pain however R>L ) to be performed by Dr. Debria Garret D. Shon Baton, MD at [name] on Thursday, June 04, 2012 at 1200PM .    Allergies(Sherry [name]; 06/01/2012 9:14 AM) No Known Drug Allergies. 04/28/2012   Family History(Sherry [name]; 06/01/2012 9:14 AM) Cancer. mother Diabetes Mellitus. mother   Social History(Sherry [name]; 06/01/2012 9:14 AM) Tobacco use. current every day smoker; smoke(d) 1 pack(s) per day [id number] smoking 12 cig. daily Alcohol use. never consumed alcohol Children. 2 Drug/Alcohol Rehab (Currently). no Drug/Alcohol Rehab (Previously). no Exercise. Exercises rarely Illicit drug use. no Living situation. live with spouse Marital status. married Number of flights of stairs before winded. 1 Tobacco / smoke exposure. no   Medication History(Sherry Oscarson J Haddon Fyfe, PA-C; 06/01/2012 9:27 AM) Norco (5-325MG  Tablet, 1 (one) Tablet Oral four times daily, as needed, Taken starting 05/29/2012) Active. (Called to [facility]/dk/sjr) MetFORMIN HCl (1000MG  Tablet, 1 Oral daily) Active. Oxaprozin (600MG  Tablet, 1 Oral daily) Active. Cyclobenzaprine HCl (5MG  Tablet, 1 Oral prn) Active. Doxycycline Hyclate ( Oral) Specific dose unknown - Active. (for tick bite; will finish this medication wednesday)   Past Surgical History(Sherry [name]; 06/01/2012 9:15 AM) Breast Mass; Local Excision. right Breast Reconstruction. right Mammoplasty; Reduction. left Mastectomy. right   Review of Systems(Sherry Lasch J [facility], PA-C;  06/01/2012 9:24 AM) General:Not Present- Chills, Fever, Night Sweats, Appetite Loss, Fatigue, Feeling sick, Weight Gain and Weight Loss. Skin:Not Present- Itching, Rash, Skin Color Changes, Ulcer, Psoriasis and Change in Hair or Nails. HEENT:Present- Sensitivity to light. Not Present- Hearing problems, Nose Bleed and Ringing in the Ears. Neck:Not Present- Swollen Glands and Neck Mass. Respiratory:Not Present- Snoring, Chronic Cough, Bloody sputum and Dyspnea. Cardiovascular:Not Present- Shortness of Breath, Chest Pain, Swelling of Extremities, Leg Cramps and Palpitations. Gastrointestinal:Not Present- Bloody Stool, Heartburn, Abdominal Pain, Vomiting, Nausea and Incontinence of Stool. Female Genitourinary:Not Present- Blood in Urine, Menstrual Irregularities, Frequency, Incontinence and Nocturia. Musculoskeletal:Present- Muscle Weakness, Muscle Pain and Back Pain. Not Present- Joint Stiffness, Joint Swelling and Joint Pain. Neurological:Present- Numbness. Not Present- Tingling, Burning, Tremor, Headaches and Dizziness. Psychiatric:Not Present- Anxiety, Depression and Memory Loss. Endocrine:Not Present- Cold Intolerance, Heat Intolerance, Excessive hunger and Excessive Thirst. Hematology:Not Present- Abnormal Bleeding, Anemia, Blood Clots and Easy Bruising.   Vitals(Sherry [name]; 06/01/2012 9:14 AM) 06/01/2012 9:13 AM Weight: 152 lb Height: 62 in Body Surface Area: 1.74 m Body Mass Index: 27.8 kg/m Pulse: 84 (Regular) BP: 140/90 (Sitting, Left Arm, Standard)    Physical Exam(Sherry Korb J Arjay Jaskiewicz, PA-C; 06/01/2012 9:58 AM) The physical exam findings are as follows:   General General Appearance- [name]. Not in acute distress. Orientation- Oriented X3. Build & Nutrition- Well nourished and Well developed. Posture- Normal posture. Gait- Normal. Mental Status- Alert.   Integumentary Lumbar Spine- Skin examination of the lumbar spine is without deformity, skin  lesions, lacerations or abrasions.   Head and Neck Neck Global Assessment- supple. no lymphadenopathy and no nucchal rigidty.   Eye Pupil- Bilateral- Normal, Direct reaction to light normal, Equal and Regular. Motion- Bilateral- EOMI.   Chest and Lung Exam Auscultation: Breath sounds:- Clear.   Cardiovascular Auscultation:Rhythm-  Regular rate and rhythm. Heart Sounds- Normal heart sounds.   Abdomen Palpation/Percussion:Palpation and Percussion of the abdomen reveal - Non Tender, No Rebound tenderness and Soft.   Peripheral Vascular Lower Extremity:Inspection- Bilateral- Inspection Normal. Palpation:Posterior tibial pulse- Bilateral- 2+. Dorsalis pedis pulse- Bilateral- 2+.   Neurologic Sensation:Lower Extremity- Bilateral- sensation is diminished to light touch in the lower extremity (L5 distribution right lower extremity). Reflexes:Patellar Reflex- Bilateral- 2+. Achilles Reflex- Bilateral- 2+. Babinski- Bilateral- Babinski not present. Clonus- Bilateral- clonus not present. Hoffman's Sign- Bilateral- Hoffman's sign not present. Testing:Seated Straight Leg Raise- Bilateral- Seated straight leg raise positive (right leg).   Musculoskeletal Spine/Ribs/Pelvis Lumbosacral Spine:Inspection and Palpation- Tenderness- generalized. bony and soft tissue palpation of the lumbar spine and SI joint does not recreate their typical pain. Strength and Tone: Strength:Hip Flexion- Bilateral- 5/5. Knee Extension- Bilateral- 5/5. Knee Flexion- Bilateral- 5/5. Ankle Dorsiflexion- Bilateral- 5/5. Ankle Plantarflexion- Bilateral- 5/5. Heel walk- Bilateral- able to heel walk without difficulty. Toe Walk- Bilateral- able to walk on toes without difficulty. Heel-Toe Walk- Bilateral- able to heel-toe walk without difficulty. ROM- Flexion- mildly decreased range of motion and painful. Extension- mildly decreased range of motion and  painful. Pain:- extension is more painful than flexion. Waddell's Signs- no Waddell's signs present. Lower Extremity Range of Motion:- No true hip, knee or ankle pain with range of motion. Gait and Station:Assistive Devices- no assistive devices.   Assessment & Plan(Tracer Gutridge J [name], PA-C; 06/01/2012 9:31 AM) Lumbar disc degeneration (722.52)  Note: Unfortunately conservative measures consisting of observation, activity modification, physical therapy and oral pain medication have failed to alleviate her symptoms and given the ongoing nature of her pain and the significant decrease in her quality of life, she wishes to proceed with surgery. Risks/benefits/alternatives to the procedure/expectations following the procedure have been reviewed with the patient by Dr. Shon Baton. She understands that the goal of surgery is to reduce not eliminate her pain. She understands the risks and the goals.  MRI of the lumbar spine demonstrates a broad-based disc herniation at L4-5 with mild facet and ligamentous hypertrophy. Stenosis of both lateral recesses at this level could cause neural compression on either or both sides.   She has already completed her pre-op hospital requirements. It has been brought to my attention that she is currently being treated for Lime Disease. I spoke to pre-admitting last week and they have stated that anesthesia is aware and as she will finish her medication prior to surgery it is okay to proceed. I have spoke to Dr. Shon Baton about the current situation and he states he will call ID to be sure there are no other contraindications.   She has been medically cleared for the procedure by [name] in [place]. Please see the scanned clearance form for the specifics. She has been fitted for a lumbar corsett brace and she knows to bring this with her the morning.  All of her questions have been encouraged, addressed and answered. Plan, at this time is to  proceed with surgery as scheduled.   Signed electronically by Gwinda Maine, PA-C (06/01/2012 9:58 AM)  [name] 05/05/2012 9:32 AM Location[facility] Patient #: [id number] DOB: 1965/12/06 Married / Language: [place] / Race: White Female   History of Present Illness(Sherry [name]; 05/05/2012 9:40 AM) The patient is a 46 year old female who presents with back pain. The patient is here today in referral from Dr. Ethelene Hal . The patient reports low back symptoms including pain which began 1 year(s) (  1/2) ago without any known injury. and Symptoms include pain (about the lower lumbar region radiating into the right lower ext. to the level of the foot ), numbness (about the right lower ext. to the, from the level of the knee to the foot ) and weakness (right lower ext. ), while symptoms do not include incontinence of stool or incontinence of urine. Current treatment includes opioid analgesics (Hydrocodone ). Prior to being seen today the patient was previously evaluated at [facility]. as well as Dr. Ethelene Hal 6 month(s) ago. Past evaluation has included x-ray of the lumbar spine, MRI of the lumbar spine (performed through [facility] ) and orthopedic evaluation. Past treatment has included activity modification and physical therapy (in past with increased pain ).    Subjective Transcription(DAHARI D BROOKS, MD; 05/08/2012 1:29 PM)  REASON FOR CONSULTATION:  Back, buttock and right leg pain.    HISTORY OF PRESENT ILLNESS:  Sherry [name], Sherry [name], who is referred to me by my partner, Dr. Ethelene Hal, for about 6-7 months of progressive back, buttock and right leg pain. The patient did not recall any specific injury, trauma or event, but she just stated she started having increasing back and right leg pain. She has had back issues in the past but she's treated this with OTC medications and activity modification.    About 6-7 months ago, she had a flare up of her back  pain but also, for the first time, noted right leg pain. She describes pain in the outer aspect of the calf and into the top of the foot as well as numbness on the outer aspect of the calf towards the top of the foot and great toe. As a result of these symptoms she ultimately saw Dr. Ethelene Hal, who ordered an MRI. The MRI was done on 04-14-12. That MRI shows a broad-based disc herniation at L4-5 causing bilateral foraminal narrowing. The right side appears to be more significant than the left. There is some minor disc degeneration at that level too. No other significant issues are noted.    Allergies(Sherry [name]; 05/05/2012 9:32 AM) No Known Drug Allergies. 04/28/2012   Social History(Sherry [name]; 05/05/2012 9:32 AM) Alcohol use. never consumed alcohol Children. 2 Drug/Alcohol Rehab (Currently). no Drug/Alcohol Rehab (Previously). no Exercise. Exercises rarely Illicit drug use. no Living situation. live with spouse Marital status. married Number of flights of stairs before winded. 1 Tobacco / smoke exposure. no Tobacco use. current every day smoker; smoke(d) 1 pack(s) per day   Medication History(Sherry [name]; 05/05/2012 9:38 AM) Norco (5-325MG  Tablet, 1 (one) Tablet Oral four times daily, as needed, Taken starting 04/28/2012) Active. PredniSONE (20MG  Tablet, Oral) Active. (stopped due to increased blood surgar levels) Oxaprozin (600MG  Tablet, Oral) Active. Cyclobenzaprine HCl (5MG  Tablet, Oral) Active. Hydrocodone-Acetaminophen (5-325MG  Tablet, Oral) Active. MetFORMIN HCl ( Oral) Specific dose unknown - Active.   Past Surgical History(Sherry [name]; 05/05/2012 9:32 AM) Breast Mass; Local Excision. right Breast Reconstruction. right Mammoplasty; Reduction. left Mastectomy. right   Objective Transcription(DAHARI Sheela Stack, MD; 05/08/2012 1:29 PM)  On clinical exam, she is a [name], Sherry [name], who appears her stated age, in no acute distress. She's alert.  She's oriented times 3. No shortness of breath or chest pain. The abdomen is soft and nontender. She has pain with extension of the spine, moderate discomfort with forward flexion. She has a well-healed surgical scar from a breast reconstruction secondary to cancer around the bra line at the thoracic level but  no lumbar skin lesions, abrasions or contusions. Positive SLR test on the right side. She has no focal motor deficits on the right lower extremity but she does have decreased sensation to light touch in the L5 distribution. Reflexes are 1+ and symmetrical. Positive SLR test on the right side.    At this point in time, clinically she demonstrates right L5 radiculopathy due to the disc protrusion at L4-5. Although it is central and it can cause some left-sided symptoms, clinically she is not having any left-sided radicular symptoms. At this point we had a long discussion about this and doing a right-sided decompression discectomy vs. doing bilateral.    Plans Transcription(DAHARI D BROOKS, MD; 05/08/2012 1:29 PM)  At this point, she is concerned that she could start to develop increasing left-sided symptoms, and I think that is a real concern. At this point I would recommend bilateral hemilaminectomies at L4-5 with a decompression and disc trimming on the right side. This would allow me to address the radiographic stenotic findings at L4-5 on the left and address the radiographic and clinical symptoms at L4-5 on the right. We discussed the risks of surgery which include infection, bleeding, nerve damage, death, stroke, paralysis, failure to heal, need for further surgery, ongoing or worse pain, loss of bowel or bladder control, major bleeding, need for further surgery, need for fusion surgery, failure to improve, worsening pain. All of her questions were addressed. We will plan on getting preoperative medical clearance from her PCP given her underlying medical issues and her  significant smoking history. We've also talked about specifically the need for smoking cessation, as it can accelerate the degenerative process and she's assured me she will take all appropriate steps to curtail this. Once we have medical clearance, we will plan on surgery at some point the middle of August.    Miscellaneous Transcription(DAHARI Sheela Stack, MD; 05/08/2012 1:29 PM)  Venita Lick, M. D./slk    T: 05-07-12  D: 05-05-12      Signed electronically by Alvy Beal, MD (05/05/2012 1:09 PM)

## 2012-06-03 MED ORDER — CEFAZOLIN SODIUM-DEXTROSE 2-3 GM-% IV SOLR
2.0000 g | INTRAVENOUS | Status: AC
  Administered 2012-06-04: 2 g via INTRAVENOUS

## 2012-06-04 ENCOUNTER — Encounter (HOSPITAL_COMMUNITY): Payer: Self-pay | Admitting: Certified Registered"

## 2012-06-04 ENCOUNTER — Ambulatory Visit (HOSPITAL_COMMUNITY): Payer: Managed Care, Other (non HMO)

## 2012-06-04 ENCOUNTER — Ambulatory Visit (HOSPITAL_COMMUNITY): Payer: Managed Care, Other (non HMO) | Admitting: Certified Registered"

## 2012-06-04 ENCOUNTER — Encounter (HOSPITAL_COMMUNITY)
Admission: RE | Disposition: A | Discharge status: Home / Self Care | Payer: Self-pay | Source: Ambulatory Visit | Attending: Orthopedic Surgery

## 2012-06-04 ENCOUNTER — Encounter (HOSPITAL_COMMUNITY): Payer: Self-pay | Admitting: *Deleted

## 2012-06-04 ENCOUNTER — Observation Stay (HOSPITAL_COMMUNITY)
Admission: RE | Admit: 2012-06-04 | Discharge: 2012-06-05 | DRG: 491 | Disposition: A | Discharge status: Home / Self Care | Payer: Managed Care, Other (non HMO) | Source: Ambulatory Visit | Attending: Orthopedic Surgery | Admitting: Orthopedic Surgery

## 2012-06-04 DIAGNOSIS — Z01812 Encounter for preprocedural laboratory examination: Secondary | ICD-10-CM | POA: Insufficient documentation

## 2012-06-04 DIAGNOSIS — M5126 Other intervertebral disc displacement, lumbar region: Principal | ICD-10-CM | POA: Insufficient documentation

## 2012-06-04 DIAGNOSIS — E119 Type 2 diabetes mellitus without complications: Secondary | ICD-10-CM | POA: Insufficient documentation

## 2012-06-04 DIAGNOSIS — M5416 Radiculopathy, lumbar region: Secondary | ICD-10-CM | POA: Diagnosis present

## 2012-06-04 DIAGNOSIS — F17200 Nicotine dependence, unspecified, uncomplicated: Secondary | ICD-10-CM | POA: Insufficient documentation

## 2012-06-04 HISTORY — PX: LUMBAR LAMINECTOMY/DECOMPRESSION MICRODISCECTOMY: SHX5026

## 2012-06-04 LAB — GLUCOSE, CAPILLARY
Glucose-Capillary: 104 mg/dL — ABNORMAL HIGH (ref 70–99)
Glucose-Capillary: 92 mg/dL (ref 70–99)

## 2012-06-04 SURGERY — LUMBAR LAMINECTOMY/DECOMPRESSION MICRODISCECTOMY 1 LEVEL
Anesthesia: General | Site: Spine Lumbar | Wound class: Clean

## 2012-06-04 MED ORDER — BUPIVACAINE-EPINEPHRINE PF 0.25-1:200000 % IJ SOLN
INTRAMUSCULAR | Status: AC
  Filled 2012-06-04: qty 30

## 2012-06-04 MED ORDER — METHOCARBAMOL 100 MG/ML IJ SOLN
500.0000 mg | Freq: Four times a day (QID) | INTRAVENOUS | Status: DC | PRN
  Administered 2012-06-04: 500 mg via INTRAVENOUS
  Filled 2012-06-04: qty 5

## 2012-06-04 MED ORDER — ROCURONIUM BROMIDE 100 MG/10ML IV SOLN
INTRAVENOUS | Status: DC | PRN
  Administered 2012-06-04: 40 mg via INTRAVENOUS

## 2012-06-04 MED ORDER — ONDANSETRON HCL 4 MG/2ML IJ SOLN: 4.0000 mg | INTRAMUSCULAR | Status: DC | PRN

## 2012-06-04 MED ORDER — BUPIVACAINE-EPINEPHRINE 0.25% -1:200000 IJ SOLN
INTRAMUSCULAR | Status: DC | PRN
  Administered 2012-06-04: 10 mL

## 2012-06-04 MED ORDER — OXYCODONE HCL 5 MG PO TABS
ORAL_TABLET | ORAL | Status: AC
  Filled 2012-06-04: qty 2

## 2012-06-04 MED ORDER — PHENOL 1.4 % MT LIQD: 1.0000 | OROMUCOSAL | Status: DC | PRN

## 2012-06-04 MED ORDER — SODIUM CHLORIDE 0.9 % IV SOLN: 250.0000 mL | INTRAVENOUS | Status: DC

## 2012-06-04 MED ORDER — HYDROMORPHONE HCL PF 1 MG/ML IJ SOLN
0.2500 mg | INTRAMUSCULAR | Status: DC | PRN
  Administered 2012-06-04 (×2): 0.5 mg via INTRAVENOUS

## 2012-06-04 MED ORDER — SODIUM CHLORIDE 0.9 % IJ SOLN: 3.0000 mL | Freq: Two times a day (BID) | INTRAMUSCULAR | Status: DC

## 2012-06-04 MED ORDER — CEFAZOLIN SODIUM 1-5 GM-% IV SOLN
1.0000 g | Freq: Three times a day (TID) | INTRAVENOUS | Status: AC
  Administered 2012-06-04 – 2012-06-05 (×2): 1 g via INTRAVENOUS
  Filled 2012-06-04 (×2): qty 50

## 2012-06-04 MED ORDER — THROMBIN 20000 UNITS EX KIT: PACK | CUTANEOUS | Status: DC | PRN

## 2012-06-04 MED ORDER — ACETAMINOPHEN 10 MG/ML IV SOLN
1000.0000 mg | Freq: Four times a day (QID) | INTRAVENOUS | Status: DC
  Administered 2012-06-04: 1000 mg via INTRAVENOUS
  Filled 2012-06-04 (×3): qty 100

## 2012-06-04 MED ORDER — INSULIN ASPART 100 UNIT/ML ~~LOC~~ SOLN: 0.0000 [IU] | SUBCUTANEOUS | Status: DC

## 2012-06-04 MED ORDER — ONDANSETRON HCL 4 MG/2ML IJ SOLN
INTRAMUSCULAR | Status: DC | PRN
  Administered 2012-06-04: 4 mg via INTRAVENOUS

## 2012-06-04 MED ORDER — ACETAMINOPHEN 10 MG/ML IV SOLN: 1000.0000 mg | Freq: Once | INTRAVENOUS | Status: DC | PRN

## 2012-06-04 MED ORDER — PROPOFOL 10 MG/ML IV EMUL
INTRAVENOUS | Status: DC | PRN
  Administered 2012-06-04: 50 mg via INTRAVENOUS
  Administered 2012-06-04: 150 mg via INTRAVENOUS

## 2012-06-04 MED ORDER — SUFENTANIL CITRATE 50 MCG/ML IV SOLN
INTRAVENOUS | Status: DC | PRN
  Administered 2012-06-04 (×3): 10 ug via INTRAVENOUS
  Administered 2012-06-04: 20 ug via INTRAVENOUS

## 2012-06-04 MED ORDER — LIDOCAINE HCL (CARDIAC) 20 MG/ML IV SOLN
INTRAVENOUS | Status: DC | PRN
  Administered 2012-06-04: 40 mg via INTRAVENOUS
  Administered 2012-06-04: 60 mg via INTRAVENOUS

## 2012-06-04 MED ORDER — MIDAZOLAM HCL 5 MG/5ML IJ SOLN
INTRAMUSCULAR | Status: DC | PRN
  Administered 2012-06-04: 2 mg via INTRAVENOUS

## 2012-06-04 MED ORDER — THROMBIN 20000 UNITS EX SOLR
CUTANEOUS | Status: AC
  Filled 2012-06-04: qty 20000

## 2012-06-04 MED ORDER — LACTATED RINGERS IV SOLN
INTRAVENOUS | Status: DC
  Administered 2012-06-04 (×2): via INTRAVENOUS

## 2012-06-04 MED ORDER — FLEET ENEMA 7-19 GM/118ML RE ENEM: 1.0000 | ENEMA | Freq: Once | RECTAL | Status: AC | PRN

## 2012-06-04 MED ORDER — SODIUM CHLORIDE 0.9 % IJ SOLN: 3.0000 mL | INTRAMUSCULAR | Status: DC | PRN

## 2012-06-04 MED ORDER — DOCUSATE SODIUM 100 MG PO CAPS
100.0000 mg | ORAL_CAPSULE | Freq: Two times a day (BID) | ORAL | Status: DC
  Administered 2012-06-04 – 2012-06-05 (×2): 100 mg via ORAL
  Filled 2012-06-04 (×3): qty 1

## 2012-06-04 MED ORDER — ACETAMINOPHEN 500 MG PO TABS
1000.0000 mg | ORAL_TABLET | Freq: Four times a day (QID) | ORAL | Status: DC
  Administered 2012-06-05 (×3): 1000 mg via ORAL
  Filled 2012-06-04 (×6): qty 2

## 2012-06-04 MED ORDER — OXYCODONE HCL 5 MG PO TABS
10.0000 mg | ORAL_TABLET | ORAL | Status: DC | PRN
  Administered 2012-06-04 – 2012-06-05 (×4): 10 mg via ORAL
  Filled 2012-06-04 (×3): qty 2

## 2012-06-04 MED ORDER — 0.9 % SODIUM CHLORIDE (POUR BTL) OPTIME
TOPICAL | Status: DC | PRN
  Administered 2012-06-04: 1000 mL

## 2012-06-04 MED ORDER — ONDANSETRON HCL 4 MG/2ML IJ SOLN: 4.0000 mg | Freq: Once | INTRAMUSCULAR | Status: DC | PRN

## 2012-06-04 MED ORDER — THROMBIN 20000 UNITS EX SOLR
CUTANEOUS | Status: DC | PRN
  Administered 2012-06-04: 14:00:00 via TOPICAL

## 2012-06-04 MED ORDER — LACTATED RINGERS IV SOLN
INTRAVENOUS | Status: DC
  Administered 2012-06-04: 22:00:00 via INTRAVENOUS

## 2012-06-04 MED ORDER — CEFAZOLIN SODIUM-DEXTROSE 2-3 GM-% IV SOLR
INTRAVENOUS | Status: AC
  Filled 2012-06-04: qty 50

## 2012-06-04 MED ORDER — HYDROMORPHONE HCL PF 1 MG/ML IJ SOLN
INTRAMUSCULAR | Status: AC
Start: 2012-06-04 — End: 2012-06-04
  Administered 2012-06-04: 0.5 mg via INTRAVENOUS
  Filled 2012-06-04: qty 1

## 2012-06-04 MED ORDER — METHOCARBAMOL 100 MG/ML IJ SOLN
500.0000 mg | INTRAVENOUS | Status: DC
  Filled 2012-06-04: qty 5

## 2012-06-04 MED ORDER — MENTHOL 3 MG MT LOZG
1.0000 | LOZENGE | OROMUCOSAL | Status: DC | PRN
  Administered 2012-06-05: 3 mg via ORAL
  Filled 2012-06-04: qty 9

## 2012-06-04 MED ORDER — ZOLPIDEM TARTRATE 5 MG PO TABS: 5.0000 mg | ORAL_TABLET | Freq: Every evening | ORAL | Status: DC | PRN

## 2012-06-04 MED ORDER — MORPHINE SULFATE 2 MG/ML IJ SOLN
1.0000 mg | INTRAMUSCULAR | Status: DC | PRN
  Administered 2012-06-04 – 2012-06-05 (×4): 4 mg via INTRAVENOUS
  Filled 2012-06-04 (×4): qty 2

## 2012-06-04 MED ORDER — METHOCARBAMOL 500 MG PO TABS
500.0000 mg | ORAL_TABLET | Freq: Four times a day (QID) | ORAL | Status: DC | PRN
  Administered 2012-06-04 – 2012-06-05 (×2): 500 mg via ORAL
  Filled 2012-06-04 (×2): qty 1

## 2012-06-04 MED ORDER — HEMOSTATIC AGENTS (NO CHARGE) OPTIME
TOPICAL | Status: DC | PRN
  Administered 2012-06-04: 1 via TOPICAL

## 2012-06-04 SURGICAL SUPPLY — 58 items
BUR EGG ELITE 4.0 (BURR) IMPLANT
BUR MATCHSTICK NEURO 3.0 LAGG (BURR) IMPLANT
CANISTER SUCTION 2500CC (MISCELLANEOUS) ×2 IMPLANT
CLOTH BEACON ORANGE TIMEOUT ST (SAFETY) ×2 IMPLANT
CLSR STERI-STRIP ANTIMIC 1/2X4 (GAUZE/BANDAGES/DRESSINGS) ×2 IMPLANT
CORDS BIPOLAR (ELECTRODE) ×2 IMPLANT
COVER SURGICAL LIGHT HANDLE (MISCELLANEOUS) ×2 IMPLANT
DERMABOND ADVANCED (GAUZE/BANDAGES/DRESSINGS) ×1
DERMABOND ADVANCED .7 DNX12 (GAUZE/BANDAGES/DRESSINGS) ×1 IMPLANT
DRAIN CHANNEL 15F RND FF W/TCR (WOUND CARE) IMPLANT
DRAPE POUCH INSTRU U-SHP 10X18 (DRAPES) ×2 IMPLANT
DRAPE SURG 17X23 STRL (DRAPES) ×2 IMPLANT
DRAPE U-SHAPE 47X51 STRL (DRAPES) ×2 IMPLANT
DRSG MEPILEX BORDER 4X4 (GAUZE/BANDAGES/DRESSINGS) ×2 IMPLANT
DRSG MEPILEX BORDER 4X8 (GAUZE/BANDAGES/DRESSINGS) ×2 IMPLANT
DURAPREP 26ML APPLICATOR (WOUND CARE) ×2 IMPLANT
ELECT BLADE 4.0 EZ CLEAN MEGAD (MISCELLANEOUS) ×2
ELECT CAUTERY BLADE 6.4 (BLADE) ×2 IMPLANT
ELECT REM PT RETURN 9FT ADLT (ELECTROSURGICAL) ×2
ELECTRODE BLDE 4.0 EZ CLN MEGD (MISCELLANEOUS) ×1 IMPLANT
ELECTRODE REM PT RTRN 9FT ADLT (ELECTROSURGICAL) ×1 IMPLANT
EVACUATOR 1/8 PVC DRAIN (DRAIN) IMPLANT
EVACUATOR SILICONE 100CC (DRAIN) ×2 IMPLANT
GLOVE BIOGEL PI IND STRL 6.5 (GLOVE) ×1 IMPLANT
GLOVE BIOGEL PI IND STRL 8.5 (GLOVE) ×2 IMPLANT
GLOVE BIOGEL PI INDICATOR 6.5 (GLOVE) ×1
GLOVE BIOGEL PI INDICATOR 8.5 (GLOVE) ×2
GLOVE ECLIPSE 6.0 STRL STRAW (GLOVE) ×2 IMPLANT
GLOVE ECLIPSE 8.5 STRL (GLOVE) ×2 IMPLANT
GOWN PREVENTION PLUS XXLARGE (GOWN DISPOSABLE) ×2 IMPLANT
GOWN STRL NON-REIN LRG LVL3 (GOWN DISPOSABLE) ×4 IMPLANT
KIT BASIN OR (CUSTOM PROCEDURE TRAY) ×2 IMPLANT
KIT ROOM TURNOVER OR (KITS) ×2 IMPLANT
NEEDLE 22X1 1/2 (OR ONLY) (NEEDLE) ×2 IMPLANT
NEEDLE SPNL 18GX3.5 QUINCKE PK (NEEDLE) ×4 IMPLANT
NS IRRIG 1000ML POUR BTL (IV SOLUTION) ×2 IMPLANT
PACK LAMINECTOMY ORTHO (CUSTOM PROCEDURE TRAY) ×2 IMPLANT
PACK UNIVERSAL I (CUSTOM PROCEDURE TRAY) ×2 IMPLANT
PAD ARMBOARD 7.5X6 YLW CONV (MISCELLANEOUS) ×4 IMPLANT
PATTIES SURGICAL .5 X.5 (GAUZE/BANDAGES/DRESSINGS) ×4 IMPLANT
PATTIES SURGICAL .5 X1 (DISPOSABLE) ×2 IMPLANT
SPONGE GAUZE 4X4 12PLY (GAUZE/BANDAGES/DRESSINGS) ×2 IMPLANT
SPONGE LAP 4X18 X RAY DECT (DISPOSABLE) ×2 IMPLANT
SPONGE SURGIFOAM ABS GEL 100 (HEMOSTASIS) IMPLANT
STRIP CLOSURE SKIN 1/2X4 (GAUZE/BANDAGES/DRESSINGS) ×2 IMPLANT
SURGIFLO TRUKIT (HEMOSTASIS) ×2 IMPLANT
SUT MNCRL AB 3-0 PS2 18 (SUTURE) ×2 IMPLANT
SUT VIC AB 0 CT1 27 (SUTURE) ×1
SUT VIC AB 0 CT1 27XBRD ANBCTR (SUTURE) ×1 IMPLANT
SUT VIC AB 1 CTX 36 (SUTURE) ×2
SUT VIC AB 1 CTX36XBRD ANBCTR (SUTURE) ×2 IMPLANT
SUT VIC AB 2-0 CT1 18 (SUTURE) ×2 IMPLANT
SYR BULB IRRIGATION 50ML (SYRINGE) ×2 IMPLANT
SYR CONTROL 10ML LL (SYRINGE) ×2 IMPLANT
TOWEL OR 17X24 6PK STRL BLUE (TOWEL DISPOSABLE) ×2 IMPLANT
TOWEL OR 17X26 10 PK STRL BLUE (TOWEL DISPOSABLE) ×2 IMPLANT
WATER STERILE IRR 1000ML POUR (IV SOLUTION) IMPLANT
YANKAUER SUCT BULB TIP NO VENT (SUCTIONS) ×2 IMPLANT

## 2012-06-04 NOTE — Preoperative (Signed)
Beta Blockers   Reason not to administer Beta Blockers:Not Applicable 

## 2012-06-04 NOTE — H&P (Signed)
H+P REVIEWED NO CHANGE IN CLINICAL EXAM 

## 2012-06-04 NOTE — Transfer of Care (Signed)
Immediate Anesthesia Transfer of Care Note  Patient: Sherry [name]  Procedure(s) Performed: Procedure(s) (LRB): LUMBAR LAMINECTOMY/DECOMPRESSION MICRODISCECTOMY 1 LEVEL (N/A)  Patient Location: PACU  Anesthesia Type: General  Level of Consciousness: awake, alert  and oriented  Airway & Oxygen Therapy: Patient Spontanous Breathing and Patient connected to nasal cannula oxygen  Post-op Assessment: Report given to PACU RN, Post -op Vital signs reviewed and stable and Patient moving all extremities X 4  Post vital signs: Reviewed and stable  Complications: No apparent anesthesia complications

## 2012-06-04 NOTE — Brief Op Note (Signed)
06/04/2012  3:35 PM  PATIENT:  Sherry [name]  46 y.o. female  PRE-OPERATIVE DIAGNOSIS:  RADICULOPATHY  POST-OPERATIVE DIAGNOSIS:  RADICULOPATHY  PROCEDURE:  Procedure(s) (LRB): LUMBAR LAMINECTOMY/DECOMPRESSION MICRODISCECTOMY 1 LEVEL (N/A)  SURGEON:  Surgeon(s) and Role:    * Venita Lick, MD - Primary  PHYSICIAN ASSISTANT:   ASSISTANTS: Norval Gable   ANESTHESIA:   general  EBL:  Total I/O In: 1000 [I.V.:1000] Out: -   BLOOD ADMINISTERED:none  DRAINS: none   LOCAL MEDICATIONS USED:  MARCAINE     SPECIMEN:  No Specimen  DISPOSITION OF SPECIMEN:  N/A  COUNTS:  YES  TOURNIQUET:  * No tourniquets in log *  DICTATION: .Other Dictation: Dictation Number [id number]  PLAN OF CARE: Admit for overnight observation  PATIENT DISPOSITION:  PACU - hemodynamically stable.

## 2012-06-04 NOTE — Anesthesia Postprocedure Evaluation (Signed)
  Anesthesia Post-op Note  Patient: Sherry [name]  Procedure(s) Performed: Procedure(s) (LRB): LUMBAR LAMINECTOMY/DECOMPRESSION MICRODISCECTOMY 1 LEVEL (N/A)  Patient Location: PACU  Anesthesia Type: General  Level of Consciousness: awake, alert  and oriented  Airway and Oxygen Therapy: Patient Spontanous Breathing and Patient connected to nasal cannula oxygen  Post-op Pain: mild  Post-op Assessment: Post-op Vital signs reviewed and Patient's Cardiovascular Status Stable  Post-op Vital Signs: stable  Complications: No apparent anesthesia complications

## 2012-06-04 NOTE — Anesthesia Preprocedure Evaluation (Addendum)
Anesthesia Evaluation  Patient identified by MRN, date of birth, ID band Patient awake    Reviewed: Allergy & Precautions, H&P , NPO status , Patient's Chart, lab work & pertinent test results  Airway Mallampati: II  Neck ROM: Full    Dental  (+) Teeth Intact and Dental Advisory Given   Pulmonary  breath sounds clear to auscultation        Cardiovascular Rhythm:Regular Rate:Normal     Neuro/Psych    GI/Hepatic   Endo/Other  Well Controlled, Type 2, Oral Hypoglycemic Agents  Renal/GU      Musculoskeletal   Abdominal   Peds  Hematology   Anesthesia Other Findings   Reproductive/Obstetrics                          Anesthesia Physical Anesthesia Plan  ASA: II  Anesthesia Plan: General   Post-op Pain Management:    Induction: Intravenous  Airway Management Planned: Oral ETT  Additional Equipment:   Intra-op Plan:   Post-operative Plan: Extubation in OR  Informed Consent:   Dental advisory given  Plan Discussed with: CRNA, Surgeon and Anesthesiologist  Anesthesia Plan Comments: (HNP L-5 H/O breast CA S/P R. Mastectomy with reconstruction with rotational flap and implant Type 2 DM glucose 104  Plan GA  Lincoln Maxin, MD )       Anesthesia Quick Evaluation

## 2012-06-04 NOTE — Progress Notes (Signed)
10 minutes after giving the IV tylenol, the patient started to break out with red bumps on her left hand, below the IV site.  The IV site did not appear infiltrated.  The patient did not have any shortness of breath or itching.  Norval Gable, PA notified, orders received.  Nsg to continue to monitor.

## 2012-06-05 ENCOUNTER — Encounter (HOSPITAL_COMMUNITY): Payer: Self-pay | Admitting: Orthopedic Surgery

## 2012-06-05 LAB — GLUCOSE, CAPILLARY

## 2012-06-05 MED ORDER — POLYETHYLENE GLYCOL 3350 17 G PO PACK: 17.0000 g | PACK | Freq: Every day | ORAL | Status: AC

## 2012-06-05 MED ORDER — ONDANSETRON HCL 4 MG PO TABS: 4.0000 mg | ORAL_TABLET | Freq: Three times a day (TID) | ORAL | Status: AC | PRN

## 2012-06-05 MED ORDER — OXYCODONE-ACETAMINOPHEN 10-325 MG PO TABS: 1.0000 | ORAL_TABLET | Freq: Four times a day (QID) | ORAL | Status: AC | PRN

## 2012-06-05 MED ORDER — METHOCARBAMOL 500 MG PO TABS: 500.0000 mg | ORAL_TABLET | Freq: Three times a day (TID) | ORAL | Status: AC

## 2012-06-05 NOTE — Progress Notes (Signed)
    Subjective: Procedure(s) (LRB): LUMBAR LAMINECTOMY/DECOMPRESSION MICRODISCECTOMY 1 LEVEL (N/A) 1 Day Post-Op  Patient reports pain as 3 on 0-10 scale.  Reports none leg pain reports incisional back pain   Positive void Negative bowel movement Positive flatus Negative chest pain or shortness of breath  Objective: Vital signs in last 24 hours: Temp:  [97.3 F (36.3 C)-98.4 F (36.9 C)] 98.4 F (36.9 C) (08/23 0541) Pulse Rate:  [72-97] 79  (08/23 0541) Resp:  [11-20] 18  (08/23 0541) BP: (85-150)/(49-95) 85/49 mmHg (08/23 0541) SpO2:  [97 %-100 %] 98 % (08/23 0541)  Intake/Output from previous day: 08/22 0701 - 08/23 0700 In: 2179.8 [I.V.:2024.8; IV Piggyback:155] Out: 75 [Blood:75]  No results found for this basename: WBC:2,RBC:2,HCT:2,PLT:2 in the last 72 hours No results found for this basename: NA:2,K:2,CL:2,CO2:2,BUN:2,CREATININE:2,GLUCOSE:2,CALCIUM:2 in the last 72 hours No results found for this basename: LABPT:2,INR:2 in the last 72 hours  Neurologically intact Neurovascular intact Dorsiflexion/Plantar flexion intact Incision: dressing C/D/I Compartment soft  Assessment/Plan: Patient stable  Continue care and mobilization with physical therapy until time of DC DC later this morning RX and dc paperwork complete FU Dr. Shon Baton 2 weeks   Gwinda Maine 06/05/2012, 6:02 AM

## 2012-06-05 NOTE — Evaluation (Signed)
Occupational Therapy Evaluation and Discharge Patient Details Name: Sherry [name] MRN: [id number] DOB: May 31, 1966 Today's Date: 06/05/2012 Time: 7829-5621 OT Time Calculation (min): 29 min  OT Assessment / Plan / Recommendation Clinical Impression  Pt s/p lumbar sx and presents with pain, decreased knowledge of precautions and overall decr level of I with ADL. All education provided and DME needs addressed. Pt to d/c this pm after therapy. No further acute OT needs at this time. Signing off.    OT Assessment  Patient does not need any further OT services    Follow Up Recommendations  No OT follow up;Supervision/Assistance - 24 hour    Barriers to Discharge      Equipment Recommendations  Rolling walker with 5" wheels;3 in 1 bedside comode    Recommendations for Other Services    Frequency       Precautions / Restrictions Precautions Precautions: Back Required Braces or Orthoses: Spinal Brace Spinal Brace: Lumbar corset;Applied in sitting position   Pertinent Vitals/Pain Pt reports intermittent back pain which has improved since recent pain medicine dose. Did not rate. Encouraged reposition for relief    ADL  Toilet Transfer: Buyer, retail Method: Sit to Barista: Materials engineer and Hygiene: Simulated;Minimal assistance Where Assessed - Engineer, mining and Hygiene: Standing Tub/Shower Transfer: Simulated;Min Psychologist, prison and probation services Method: Ambulating (sideways entry/exit) Equipment Used: Back brace;Rolling walker Transfers/Ambulation Related to ADLs: Pt supervision with RW ambulation throughout room  ADL Comments: Pt educated on AE and where to purchase. Declined practice with equipment    OT Diagnosis:    OT Problem List:   OT Treatment Interventions:     OT Goals    Visit Information  Last OT Received On: 06/05/12 Assistance Needed: +1    Subjective Data  Subjective: My back hurts too much if I sit down too long. Patient Stated Goal: Return home with family   Prior Functioning  Vision/Perception  Home Living Lives With: Spouse;Family Available Help at Discharge: Family Type of Home: Apartment Home Access: Stairs to enter Secretary/administrator of Steps: 8 Entrance Stairs-Rails: Right;Left Home Layout: One level Bathroom Shower/Tub: Forensic psychologist: None Prior Function Level of Independence: Independent Able to Take Stairs?: Yes Driving: Yes Vocation: Holiday representative Communication: No difficulties Dominant Hand: Right      Cognition  Overall Cognitive Status: Appears within functional limits for tasks assessed/performed Arousal/Alertness: Awake/alert Orientation Level: Oriented X4 / Intact Behavior During Session: [facility] for tasks performed    Extremity/Trunk Assessment Left Upper Extremity Assessment LUE ROM/Strength/Tone: Within functional levels Right Lower Extremity Assessment RLE ROM/Strength/Tone: Within functional levels   Mobility Bed Mobility Bed Mobility: Not assessed Transfers Sit to Stand: 5: Supervision;With armrests;From chair/3-in-1 Stand to Sit: 5: Supervision;With armrests;To chair/3-in-1 Details for Transfer Assistance: VC for hand placement   Exercise    Balance    End of Session OT - End of Session Equipment Utilized During Treatment: Gait belt;Back brace Activity Tolerance: Patient limited by fatigue Patient left: in bed;with family/visitor present;with call bell/phone within reach Nurse Communication: Mobility status  GO     Starlena Beil 06/05/2012, 3:29 PM

## 2012-06-05 NOTE — Progress Notes (Signed)
UR COMPLETED  

## 2012-06-05 NOTE — Op Note (Signed)
Sherry [name], Sherry [name]                ACCOUNT NO.:  [id number]  MEDICAL RECORD NO.:  [id number]  LOCATION:  5N24C                        FACILITY:  [facility]  PHYSICIAN:  Alvy Beal, MD    DATE OF BIRTH:  Oct 01, 1966  DATE OF PROCEDURE:  06/04/2012 DATE OF DISCHARGE:                              OPERATIVE REPORT   PREOPERATIVE DIAGNOSIS:  Lumbar spinal stenosis with disk herniation.  POSTOPERATIVE DIAGNOSIS:  Lumbar spinal stenosis with disk herniation.  OPERATIVE PROCEDURE: 1. Lumbar posterolateral decompression L4-5. 2. L4-5 right-sided lumbar diskectomy.  FIRST ASSISTANT:  Norval Gable, [place].  HISTORY:  This is a very pleasant woman who is presenting with bilateral leg pain, with a hard broad-based disk herniation with spinal stenosis at L4-5.  Despite conservative management, her bilateral leg pain and back pain continued.  At that point, due to the failure of conservative management, she elected to proceed with surgery.  All appropriate risks, benefits, and alternatives were discussed with the patient and consent was obtained.  OPERATIVE NOTE:  The patient was brought to the operating room, placed supine on the operating table.  After successful induction of general anesthesia and endotracheal intubation, TEDs, SCDs were applied.  She was turned prone onto the [place] frame.  All bony prominences were well padded.  The back was prepped and draped in a standard fashion.  Time- out was done confirming patient, procedure, and all other pertinent important data.  Once this was done, two 18-gauge needles were placed into the back.  Intraoperative x-ray was taken to confirm the incision site.  Once this was done, the proposed incision site was infiltrated with 0.25% Marcaine with epi.  An inch and a half incision was made centered over the 4-5 disk space. Sharp dissection was carried out down to the deep fascia.  The deep fascia was sharply incised.  Then using Bovie and Cobb  elevator, I resected the paraspinal muscles to expose the posterior spinous process of L4 and L5 as well as the lamina and facet complex.  Once this was done bilaterally, retractors were placed and a Penfield 4 was placed into the L4 lamina.  I confirmed that this was the appropriate level with x-ray.  I then removed the majority of the L4 spinous process and exposed the underlying significant thickening of the ligamentum flavum in the midline.  Using a nerve hook, I dissected through this and then using a curved curette, I was able to create a plane between the lamina and ligamentum flavum.  I used a 2 and 3 mm Kerrison to perform a generous laminotomy of L4.  I then resected the ligamentum flavum and exposed the underlying thecal sac.  Neuro patties were placed underneath the leading edge of the L5 spinous process and lamina to protect it.  I then used a 2-and 3 mm Kerrison to perform a partial laminotomy of the superior portion of the L5 level.  This allowed me excellent access down into the lateral recess.  There was significant bone spurs off the inferior L4 facet complex bilaterally, right side being more pronounced than the left.  I gently resected these bone spurs.  I noted that the L5 nerve  root was significantly compressed in the lateral recess.  Once I completed my decompression, the L5 nerve root was adequately decompressed, I could palpate the L5 pedicle and I could visualize the L5 nerve root in the lateral recess and going around the pedicle and into the foramen, it was adequately decompressed.  I then checked the left L4-5 disk space and there was no evidence of any disk herniation or significant protrusion.  Furthermore, I had an adequate laminotomy and decompression of this area and so the L5 nerve root was no longer under any tension on that side.  On the right-hand side, I performed a similar decompression removing the hard disk osteophytes and removing the pressure  from the L5 nerve root. At this point, I swept the 5 nerve root medially and then evaluated the disk space.  At this point, I was concerned there was some protrusion of the disk.  I then incised the anulus with a 15 blade scalpel.  Then using pituitary rongeurs and nerve hooks, I resected the small disk fragment that I herniated out.  Once I had done this, I then completed my decompression.  I irrigated copiously with normal saline and at this point, I was quite pleased with the decompression.  I could freely take a [facility] elevator superiorly, inferiorly, and out the L5 foramen bilaterally and along the disk space underneath the thecal sac.  There was no significant compression.  I irrigated the wound copiously with normal saline, obtained hemostasis using bipolar electrocautery, and then placed thrombin-soaked Gelfoam patty over the exposed thecal sac. I then closed in a sequential fracture layer with #1 Vicryl sutures, interrupted 2-0 Vicryl sutures, and 3-0 Monocryl.  Steri-Strips and dry dressing were applied.  The patient was extubated and transferred to the PACU without incident.  At the end of the case, all needle and sponge counts were correct.  There was no adverse intraoperative events.     Alvy Beal, MD     DDB/MEDQ  D:  06/04/2012  T:  06/05/2012  Job:  [id number]

## 2012-06-05 NOTE — Discharge Summary (Signed)
Patient ID: Sherry [name] MRN: [id number] DOB/AGE: [birth year] 46 y.o.  Admit date: 06/04/2012 Discharge date: 06/05/2012   Admission Diagnoses:  Principal Problem:  *Radiculopathy, lumbar region   Discharge Diagnoses:  Principal Problem:  *Radiculopathy, lumbar region  status post Procedure(s): LUMBAR LAMINECTOMY/DECOMPRESSION MICRODISCECTOMY 1 LEVEL  Past Medical History  Diagnosis Date  . Hypertension   . Vitamin d deficiency   . Gallstones   . Breast cancer 2010    s/p mastectomy  . Sinus infection     pt is on Doxycycline  . History of blood clots     neck-after reconstructive surgery  . Chronic back pain     bulding/herniated disc  . Hemorrhoids   . Constipation     related to pain meds and takes stool softener prn  . Nocturia   . Diabetes mellitus     takes Saxagliptin-Metformin daily  . Lyme disease     taking Doxycycline-to finish up 06/03/12    Surgeries: Procedure(s): LUMBAR LAMINECTOMY/DECOMPRESSION MICRODISCECTOMY 1 LEVEL on 06/04/2012   Consultants: none  Discharged Condition: Improved  Hospital Course: Sherry [name] is an 46 y.o. female who was admitted 06/04/2012 for operative treatment of Radiculopathy, lumbar region. Patient failed conservative treatments (please see the history and physical for the specifics) and had severe unremitting pain that affects sleep, daily activities and work/hobbies. After pre-op clearance, the patient was taken to the operating room on 06/04/2012 and underwent  Procedure(s): LUMBAR LAMINECTOMY/DECOMPRESSION MICRODISCECTOMY 1 LEVEL.    Patient was given perioperative antibiotics: Anti-infectives     Start     Dose/Rate Route Frequency Ordered Stop   06/04/12 2100   ceFAZolin (ANCEF) IVPB 1 g/50 mL premix        1 g 100 mL/hr over 30 Minutes Intravenous Every 8 hours 06/04/12 1651 06/05/12 0452   06/03/12 1429   ceFAZolin (ANCEF) IVPB 2 g/50 mL premix        2 g 100 mL/hr over 30 Minutes Intravenous 60 min  pre-op 06/03/12 1429 06/04/12 1315           Patient was given sequential compression devices and early ambulation to prevent DVT.   Patient benefited maximally from hospital stay and there were no complications. At the time of discharge, the patient was urinating/moving their bowels without difficulty, tolerating a regular diet, pain is controlled with oral pain medications and they have been cleared by PT/OT.   Recent vital signs: Patient Vitals for the past 24 hrs:  BP Temp Temp src Pulse Resp SpO2  06/05/12 0541 85/49 mmHg 98.4 F (36.9 C) Oral 79  18  98 %  06/05/12 0233 121/72 mmHg 98 F (36.7 C) Oral 81  18  99 %  06/04/12 2233 127/78 mmHg 97.8 F (36.6 C) - 72  18  98 %  06/04/12 1630 125/74 mmHg 97.3 F (36.3 C) - 75  13  100 %  06/04/12 1624 135/83 mmHg - - 79  11  100 %  06/04/12 1618 - - - 85  16  99 %  06/04/12 1615 - - - 87  13  99 %  06/04/12 1610 134/82 mmHg - - - - -  06/04/12 1600 - - - 97  17  98 %  06/04/12 1550 136/79 mmHg 97.8 F (36.6 C) - 94  16  98 %  06/04/12 0935 150/95 mmHg 97.5 F (36.4 C) Oral 95  20  97 %     Recent laboratory studies: No results  found for this basename: WBC:2,HGB:2,HCT:2,PLT:2,NA:2,K:2,CL:2,CO2:2,BUN:2,CREATININE:2,GLUCOSE:2,PT:2,INR:2,CALCIUM,2: in the last 72 hours   Discharge Medications:   Medication List  As of 06/05/2012  5:58 AM   STOP taking these medications         cyclobenzaprine 5 MG tablet      doxycycline 100 MG tablet      HYDROcodone-acetaminophen 5-325 MG per tablet         TAKE these medications         methocarbamol 500 MG tablet   Commonly known as: ROBAXIN   Take 1 tablet (500 mg total) by mouth 3 (three) times daily. MAX 3 pills daily      ondansetron 4 MG tablet   Commonly known as: ZOFRAN   Take 1 tablet (4 mg total) by mouth every 8 (eight) hours as needed for nausea. MAX 3 pills daily      oxaprozin 600 MG tablet   Commonly known as: DAYPRO   Take 600 mg by mouth daily.       oxyCODONE-acetaminophen 10-325 MG per tablet   Commonly known as: PERCOCET   Take 1 tablet by mouth every 6 (six) hours as needed for pain. MAX 4 pills daily      polyethylene glycol packet   Commonly known as: MIRALAX / GLYCOLAX   Take 17 g by mouth daily. Take 1 packet daily until bowels become regular      Saxagliptin-Metformin 02-999 MG Tb24   Take 1 tablet by mouth daily.            Diagnostic Studies: Dg Lumbar Spine 2-3 Views  06/04/2012  *RADIOLOGY REPORT*  Clinical Data: Radiculopathy.  LUMBAR SPINE - 2-3 VIEW  Comparison: 06/04/2012  Findings: First image demonstrates posterior surgical instruments directed at the L4 and L5 vertebral bodies.  Second image demonstrates posterior surgical instruments at the L4- 5 level and just inferior to the L5 pedicles.  IMPRESSION: Intraoperative localization as above.   Original Report Authenticated By: Cyndie Chime, M.D.    Dg Lumbar Spine 2-3 Views  05/29/2012  *RADIOLOGY REPORT*  Clinical Data: Preop  LUMBAR SPINE - 2-3 VIEW  Comparison: 04/14/2012 and 02/05/2011  Findings: Two views of the lumbar spine submitted.  No acute fracture or subluxation.  Mild anterior spurring at L4-L5 level. Mild disc space flattening at L5-S1 level. At least 3 calcified gallstones are noted gallbladder region the largest measures 1.8 cm.  IMPRESSION: No acute fracture or subluxation.  Mild degenerative changes. Gallstones are noted in the right upper quadrant.  Original Report Authenticated By: Natasha Mead, M.D.   Dg Lumbar Spine 1 View  06/04/2012  *RADIOLOGY REPORT*  Clinical Data: Instrument localization  LUMBAR SPINE - 1 VIEW  Comparison: 05/29/2012  Findings: Using the same numbering scheme as the exam dated 04/14/2012 the lumbar radiograph was annotated.  Tissue spreaders posterior to the L5 vertebra and there is a surgical probe which is directed towards the L4-5 disc space.  IMPRESSION:  1.  Radiograph labeled using prior scan. 2.  Surgical probe is  directed towards the L4-5 disc space.   Original Report Authenticated By: Rosealee Albee, M.D.     Discharge Orders    Future Orders Please Complete By Expires   Diet - low sodium heart healthy      Call MD / Call 911      Comments:   If you experience chest pain or shortness of breath, CALL 911 and be transported to the hospital emergency room.  If you develope  a fever above 101 F, pus (white drainage) or increased drainage or redness at the wound, or calf pain, call your surgeon's office.   Constipation Prevention      Comments:   Drink plenty of fluids.  Prune juice may be helpful.  You may use a stool softener, such as Colace (over the counter) 100 mg twice a day.  Use MiraLax (over the counter) for constipation as needed.   Increase activity slowly as tolerated      Discharge instructions      Comments:   Keep incision clean and dry.  Leave steri strips in place.  May shower 5 days from surgery; pat to dry following shower.  May redress with clean, dry dressing if you would like.  Do not apply any lotion/cream/ointment to the incision.   Driving restrictions      Comments:   No driving for 2 weeks.  Dr Shon Baton will discuss addition driving restrictions at your first post-op visit in 2 weeks.   Lifting restrictions      Comments:   No lifting anything greater than 5 pounds.  DO NOT reach overhead (above shoulder height).  NO bending, stooping or squatting.  Dr. Shon Baton will discuss additional lifting restrictions at your first post-op visit in 2 weeks.      Follow-up Information    Follow up with Alvy Beal, MD in 2 weeks.   Contact information:   [facility] [address] [place] [address] [phone]          Discharge Plan:  discharge to Home   Disposition: stable at the time of discharge     Signed: [place] for Dr. Venita Lick [facility] [phone] 06/05/2012, 5:58 AM

## 2012-06-05 NOTE — Evaluation (Signed)
Physical Therapy Evaluation and Discharge.  Patient Details Name: Sherry [name] MRN: [id number] DOB: 09/29/1966 Today's Date: 06/05/2012 Time: 1030-1103 PT Time Calculation (min): 33 min  PT Assessment / Plan / Recommendation Clinical Impression  Sherry [name] is a 46 y/o female s/p lumbar diskectomy. Pt educated on back precaution and techniques for functional mobility.  May benefit from OT eval for edaptive equipment proior discharge to home today.  Acute Pt signing off.     PT Assessment  Patent does not need any further PT services    Follow Up Recommendations  No PT follow up    Barriers to Discharge        Equipment Recommendations  Rolling walker with 5" wheels;3 in 1 bedside comode    Recommendations for Other Services     Frequency      Precautions / Restrictions Precautions Precautions: Back Precaution Booklet Issued: Yes (comment)   Pertinent Vitals/Pain Pt c/o 7/10 pain in low back RN to medicate pt.       Mobility  Bed Mobility Bed Mobility: Sit to Sidelying Left;Left Sidelying to Sit;Sitting - Scoot to Edge of Bed Left Sidelying to Sit: 4: Min assist;HOB flat Sitting - Scoot to [place] of Bed: 4: Min assist Sit to Sidelying Left: 5: Supervision;HOB flat Details for Bed Mobility Assistance: pt instructed in proper technique to maintain back precautions. Assist for LEs to transition from sit to supine.  Transfers Transfers: Sit to Stand;Stand to Sit Sit to Stand: 4: Min guard;From chair/3-in-1;From bed;With upper extremity assist Stand to Sit: 4: Min guard;With upper extremity assist;To bed;To chair/3-in-1 Details for Transfer Assistance: Cueing to scoot to edge of chair prior to standing  Cued on proper technique to maintain back precautions.  Ambulation/Gait Ambulation/Gait Assistance: 6: Modified independent (Device/Increase time);5: Supervision Ambulation Distance (Feet): 300 Feet Assistive device: Rolling walker Ambulation/Gait Assistance Details:  Instructed pt in proper use of RW with minimal dependence on UEs.  Pt cued to increase gait speed.    Gait Pattern: Step-through pattern Gait velocity: Very slow initially but WFL after cueing to increase speed.  Stairs: Yes Stairs Assistance: 5: Supervision Stairs Assistance Details (indicate cue type and reason): instructed pt in safe technique  Stair Management Technique: One rail Left Number of Stairs: 4  Wheelchair Mobility Wheelchair Mobility: No    Exercises     PT Diagnosis:    PT Problem List:   PT Treatment Interventions:     PT Goals    Visit Information  Last PT Received On: 06/05/12    Subjective Data  Subjective: agree to pt eval    Prior Functioning  Home Living Lives With: Spouse;Family Available Help at Discharge: Family Type of Home: Apartment Home Access: Stairs to enter Secretary/administrator of Steps: 8 Entrance Stairs-Rails: Right;Left Home Layout: One level Bathroom Shower/Tub: Forensic psychologist: None Prior Function Level of Independence: Independent Able to Take Stairs?: Yes Driving: Yes Vocation: Holiday representative Communication: No difficulties Dominant Hand: Right    Cognition  Overall Cognitive Status: Appears within functional limits for tasks assessed/performed Arousal/Alertness: Awake/alert Orientation Level: Oriented X4 / Intact Behavior During Session: [facility] for tasks performed    Extremity/Trunk Assessment Right Upper Extremity Assessment RUE ROM/Strength/Tone: Deficits RUE ROM/Strength/Tone Deficits: Pt reports R UE weakness from mastectomy several years ago.  RUE Sensation: History of peripheral neuropathy RUE Coordination: WFL - gross motor Left Upper Extremity Assessment LUE ROM/Strength/Tone: Within functional levels Right Lower Extremity Assessment RLE ROM/Strength/Tone: Within functional levels Left  Lower Extremity Assessment LLE ROM/Strength/Tone: Within  functional levels   Balance Balance Balance Assessed: Yes Static Sitting Balance Static Sitting - Balance Support: Feet supported Static Sitting - Level of Assistance: 7: Independent Static Standing Balance Static Standing - Balance Support: No upper extremity supported Static Standing - Level of Assistance: 5: Stand by assistance  End of Session PT - End of Session Equipment Utilized During Treatment: Gait belt;Back brace Activity Tolerance: Patient tolerated treatment well Patient left: in chair;with call bell/phone within reach;with family/visitor present Nurse Communication: Mobility status;Precautions;Patient requests pain meds  GP     Sherry [name] 06/05/2012, 1:19 PM  Sherry [name] L. Sherry [name] DPT [phone]

## 2012-06-05 NOTE — Care Management Note (Signed)
    Page 1 of 2   06/05/2012     12:08:43 PM   CARE MANAGEMENT NOTE 06/05/2012  Patient:  Sherry [name],Sherry [name]   Account Number:  [id number]  Date Initiated:  06/05/2012  Documentation initiated by:  Anette Guarneri  Subjective/Objective Assessment:   POD#1 s/p L4-5 Lam.  Lives at home with spouse  DME needed     Action/Plan:   home with self care   Anticipated DC Date:  06/05/2012   Anticipated DC Plan:  HOME/SELF CARE      DC Planning Services  CM consult      PAC Choice  DURABLE MEDICAL EQUIPMENT   Choice offered to / List presented to:  C-1 Patient   DME arranged  3-N-1  Levan Hurst      DME agency  [facility]        Status of service:  Completed, signed off Medicare Important Message given?  NO (If response is "NO", the following Medicare IM given date fields will be blank) Date Medicare IM given:   Date Additional Medicare IM given:    Discharge Disposition:  HOME/SELF CARE  Per UR Regulation:  Reviewed for med. necessity/level of care/duration of stay  If discussed at Long Length of Stay Meetings, dates discussed:    Comments:  06/05/12 12:06 Anette Guarneri RN/CM Patient request 3n1, PT recommends RW, [employer] requires [employer] for DME, patient agrees with Christoper Allegra faxed facesheet/Op report and MD orders to Christoper Allegra [phone] husband to pick up DME at [facility], husband has address of facility No f/u PT needed.

## 2012-06-08 LAB — GLUCOSE, CAPILLARY: Glucose-Capillary: 109 mg/dL — ABNORMAL HIGH (ref 70–99)

## 2012-07-25 ENCOUNTER — Telehealth: Payer: Self-pay

## 2012-07-25 DIAGNOSIS — E119 Type 2 diabetes mellitus without complications: Secondary | ICD-10-CM

## 2012-07-25 NOTE — Telephone Encounter (Signed)
PATIENT CALLED UPSET THAT SHES LEFT SEVERAL MESSAGES AND HAS NOT HAD A PHONE CALL RETURNED (THERE ARE NO RECENT PHONE MESSAGES?). PATIENT JUST NEEDS A DIFFERENT TYPE OF  MEDICATION (i THINK SHE SAID ENSURE?) RX. BECAUSE HER CURRENT INSURANCE DOES NOT COVER THE ONE SHE WAS PRESCRIBED. IF SOMEONE COULD GET BACK TO HER AS SOON AS POSSIBLE. CALL [phone].

## 2012-07-25 NOTE — Telephone Encounter (Signed)
Patient received letter from pharmacy stating insurance would no longer cover metformin. She would have to pay full amount unless it was changed to Janumet, Janumet XR, or Jentadueto. Or pre-auth required. She has been out of Metformin 1000 mg QD x 2 weeks and her glucose has been running 180-200. When she was on Metformin glucose was 100-115. Please advise if change med or pre- auth. [facility]. [phone].

## 2012-07-26 MED ORDER — SITAGLIPTIN PHOS-METFORMIN HCL 50-1000 MG PO TABS: 1.0000 | ORAL_TABLET | Freq: Two times a day (BID) | ORAL | Status: DC

## 2012-07-26 NOTE — Telephone Encounter (Signed)
So I am a little confused because it looks like patient in on Onglyza/metformin combo but just metformin.  If pt is on the combo we can switch over but if patient is just on metformin at her 8/13 visit when her A1C was good I would go to [facility] or [facility] and get just metformin for $4.  If she was on the combo we can send in janumet 50/1000 qd.

## 2012-07-26 NOTE — Telephone Encounter (Signed)
Spoke with patient and she wasn't sure what the name was so I called pharmacy to confirm and she was on Kombiglyze XR 02/999 mg po QD.

## 2012-08-19 ENCOUNTER — Encounter (HOSPITAL_COMMUNITY): Payer: Self-pay | Admitting: Emergency Medicine

## 2012-08-19 ENCOUNTER — Emergency Department (HOSPITAL_COMMUNITY)
Admission: EM | Admit: 2012-08-19 | Discharge: 2012-08-19 | Disposition: A | Discharge status: Home / Self Care | Payer: Managed Care, Other (non HMO) | Attending: Emergency Medicine | Admitting: Emergency Medicine

## 2012-08-19 DIAGNOSIS — I10 Essential (primary) hypertension: Secondary | ICD-10-CM | POA: Insufficient documentation

## 2012-08-19 DIAGNOSIS — Z8619 Personal history of other infectious and parasitic diseases: Secondary | ICD-10-CM | POA: Insufficient documentation

## 2012-08-19 DIAGNOSIS — Z79899 Other long term (current) drug therapy: Secondary | ICD-10-CM | POA: Insufficient documentation

## 2012-08-19 DIAGNOSIS — F17200 Nicotine dependence, unspecified, uncomplicated: Secondary | ICD-10-CM | POA: Insufficient documentation

## 2012-08-19 DIAGNOSIS — Z862 Personal history of diseases of the blood and blood-forming organs and certain disorders involving the immune mechanism: Secondary | ICD-10-CM | POA: Insufficient documentation

## 2012-08-19 DIAGNOSIS — M545 Low back pain, unspecified: Secondary | ICD-10-CM | POA: Insufficient documentation

## 2012-08-19 DIAGNOSIS — G8929 Other chronic pain: Secondary | ICD-10-CM

## 2012-08-19 DIAGNOSIS — Z8719 Personal history of other diseases of the digestive system: Secondary | ICD-10-CM | POA: Insufficient documentation

## 2012-08-19 DIAGNOSIS — Z791 Long term (current) use of non-steroidal anti-inflammatories (NSAID): Secondary | ICD-10-CM | POA: Insufficient documentation

## 2012-08-19 DIAGNOSIS — Z760 Encounter for issue of repeat prescription: Secondary | ICD-10-CM

## 2012-08-19 DIAGNOSIS — Z8709 Personal history of other diseases of the respiratory system: Secondary | ICD-10-CM | POA: Insufficient documentation

## 2012-08-19 DIAGNOSIS — Z8679 Personal history of other diseases of the circulatory system: Secondary | ICD-10-CM | POA: Insufficient documentation

## 2012-08-19 DIAGNOSIS — Z8639 Personal history of other endocrine, nutritional and metabolic disease: Secondary | ICD-10-CM | POA: Insufficient documentation

## 2012-08-19 DIAGNOSIS — E119 Type 2 diabetes mellitus without complications: Secondary | ICD-10-CM | POA: Insufficient documentation

## 2012-08-19 DIAGNOSIS — Z853 Personal history of malignant neoplasm of breast: Secondary | ICD-10-CM | POA: Insufficient documentation

## 2012-08-19 MED ORDER — HYDROCODONE-ACETAMINOPHEN 5-325 MG PO TABS: 1.0000 | ORAL_TABLET | Freq: Four times a day (QID) | ORAL | Status: DC | PRN

## 2012-08-19 MED ORDER — HYDROCODONE-ACETAMINOPHEN 5-325 MG PO TABS
1.0000 | ORAL_TABLET | Freq: Once | ORAL | Status: AC
  Administered 2012-08-19: 1 via ORAL
  Filled 2012-08-19: qty 1

## 2012-08-19 NOTE — ED Notes (Signed)
Pt alert, arrives from home, c/o low back pain, denies recent injury, states sx 8 wks ago, ambulates to triage, resp even unlabored, skin pwd

## 2012-08-19 NOTE — ED Provider Notes (Signed)
Medical screening examination/treatment/procedure(s) were performed by non-physician practitioner and as supervising physician I was immediately available for consultation/collaboration.   Loren Racer, MD 08/19/12 [phone]

## 2012-08-19 NOTE — ED Provider Notes (Signed)
History     CSN: [id number]  Arrival date & time 08/19/12  0021   First MD Initiated Contact with Patient 08/19/12 0033      Chief Complaint  Patient presents with  . Back Pain    (Consider location/radiation/quality/duration/timing/severity/associated sxs/prior treatment) HPI Comments: Patient had lumbar surgery performed by Dr. Shon Baton on August 22.  She has been in physical therapy.  Since, then.  She ran out of her Vicodin prescription 3 days, ago.  She is waiting for Dr. Shon Baton to refill it for her but that will be one or 2, days.  She has an appointment with Dr. Shon Baton on Friday for followup evaluation.  Presents tonight with increased pain, and unable to find a comfortable position to sleep.  She denies any fevers, dysuria, constipation, nausea, vomiting, diarrhea, vaginal discharge, trauma.  He did attend physical therapy today, which seemed to exacerbate her pain, especially, since she's not had any of her chronic pain medication  Patient is a 46 y.o. female presenting with back pain. The history is provided by the patient.  Back Pain  This is a new problem. The problem occurs constantly. The problem has not changed since onset.The pain is associated with no known injury. The pain is present in the lumbar spine. The pain is at a severity of 5/10. The pain is moderate. The symptoms are aggravated by certain positions. Pertinent negatives include no fever, no numbness, no abdominal pain, no dysuria and no weakness.    Past Medical History  Diagnosis Date  . Hypertension   . Vitamin D deficiency   . Gallstones   . Breast cancer 2010    s/p mastectomy  . Sinus infection     pt is on Doxycycline  . History of blood clots     neck-after reconstructive surgery  . Chronic back pain     bulding/herniated disc  . Hemorrhoids   . Constipation     related to pain meds and takes stool softener prn  . Nocturia   . Diabetes mellitus     takes Saxagliptin-Metformin daily  . Lyme  disease     taking Doxycycline-to finish up 06/03/12    Past Surgical History  Procedure Date  . Cesarean section 1992/1995  . Mastectomy 04/27/10    right  . Breast surgery   . Breast reconstruction 7/12  . Port a cath placed 2011  . Port a cath removed   . Lumbar laminectomy/decompression microdiscectomy 06/04/2012    Procedure: LUMBAR LAMINECTOMY/DECOMPRESSION MICRODISCECTOMY 1 LEVEL;  Surgeon: Venita Lick, MD;  Location: [facility] OR;  Service: Orthopedics;  Laterality: N/A;  L4-5 BILATERAL HEMILAMINECTOMY/MICRODISCECTOMY  . Back surgery     Family History  Problem Relation Age of Onset  . Diabetes Mother   . Diabetes Sister     History  Substance Use Topics  . Smoking status: Current Every Day Smoker -- 1.0 packs/day for 28 years    Types: Cigarettes  . Smokeless tobacco: Not on file  . Alcohol Use: No    OB History    Grav Para Term Preterm Abortions TAB SAB Ect Mult Living                  Review of Systems  Constitutional: Negative for fever and chills.  Gastrointestinal: Negative for nausea, vomiting, abdominal pain, diarrhea and constipation.  Genitourinary: Negative for dysuria, flank pain and vaginal discharge.  Musculoskeletal: Positive for back pain.  Skin: Negative for wound.  Neurological: Negative for dizziness, weakness and  numbness.    Allergies  Review of patient's allergies indicates no known allergies.  Home Medications   Current Outpatient Rx  Name  Route  Sig  Dispense  Refill  . CYCLOBENZAPRINE HCL 5 MG PO TABS   Oral   Take 5 mg by mouth 3 (three) times daily as needed. For muscle spasms         . HYDROCODONE-ACETAMINOPHEN 5-325 MG PO TABS   Oral   Take 1 tablet by mouth every 8 (eight) hours as needed. For pain         . MELOXICAM 15 MG PO TABS   Oral   Take 15 mg by mouth daily.         [name] SITAGLIPTIN-METFORMIN HCL 50-1000 MG PO TABS   Oral   Take 1 tablet by mouth 2 (two) times daily with a meal.         .  HYDROCODONE-ACETAMINOPHEN 5-325 MG PO TABS   Oral   Take 1 tablet by mouth every 6 (six) hours as needed for pain.   30 tablet   0     BP 149/91  Pulse 98  Temp 97.9 F (36.6 C) (Oral)  Resp 16  SpO2 99%  LMP 08/15/2012  Physical Exam  Constitutional: She is oriented to person, place, and time. She appears well-developed and well-nourished.  HENT:  Head: Normocephalic.  Eyes: Pupils are equal, round, and reactive to light.  Neck: Normal range of motion.  Cardiovascular: Normal rate.   Pulmonary/Chest: Effort normal.  Abdominal: Soft.  Musculoskeletal: Normal range of motion. She exhibits tenderness. She exhibits no edema.       Arms: Neurological: She is alert and oriented to person, place, and time.  Skin: Skin is warm. No erythema.    ED Course  Procedures (including critical care time)  Labs Reviewed - No data to display No results found.   1. Chronic low back pain   2. Medication refill       MDM   Exacerbation of chronic back pain, medication refill        Arman Filter, NP 08/19/12 [phone]

## 2012-09-05 ENCOUNTER — Emergency Department (HOSPITAL_COMMUNITY)
Admission: EM | Admit: 2012-09-05 | Discharge: 2012-09-05 | Disposition: A | Discharge status: Home / Self Care | Payer: Managed Care, Other (non HMO) | Attending: Emergency Medicine | Admitting: Emergency Medicine

## 2012-09-05 DIAGNOSIS — Z862 Personal history of diseases of the blood and blood-forming organs and certain disorders involving the immune mechanism: Secondary | ICD-10-CM | POA: Insufficient documentation

## 2012-09-05 DIAGNOSIS — E119 Type 2 diabetes mellitus without complications: Secondary | ICD-10-CM | POA: Insufficient documentation

## 2012-09-05 DIAGNOSIS — Z853 Personal history of malignant neoplasm of breast: Secondary | ICD-10-CM | POA: Insufficient documentation

## 2012-09-05 DIAGNOSIS — Z79899 Other long term (current) drug therapy: Secondary | ICD-10-CM | POA: Insufficient documentation

## 2012-09-05 DIAGNOSIS — I10 Essential (primary) hypertension: Secondary | ICD-10-CM | POA: Insufficient documentation

## 2012-09-05 DIAGNOSIS — A6920 Lyme disease, unspecified: Secondary | ICD-10-CM | POA: Insufficient documentation

## 2012-09-05 DIAGNOSIS — J3489 Other specified disorders of nose and nasal sinuses: Secondary | ICD-10-CM | POA: Insufficient documentation

## 2012-09-05 DIAGNOSIS — H9209 Otalgia, unspecified ear: Secondary | ICD-10-CM | POA: Insufficient documentation

## 2012-09-05 DIAGNOSIS — Z8719 Personal history of other diseases of the digestive system: Secondary | ICD-10-CM | POA: Insufficient documentation

## 2012-09-05 DIAGNOSIS — H9203 Otalgia, bilateral: Secondary | ICD-10-CM

## 2012-09-05 DIAGNOSIS — Z9889 Other specified postprocedural states: Secondary | ICD-10-CM | POA: Insufficient documentation

## 2012-09-05 LAB — GLUCOSE, CAPILLARY: Glucose-Capillary: 212 mg/dL — ABNORMAL HIGH (ref 70–99)

## 2012-09-05 MED ORDER — TRIAMCINOLONE ACETONIDE(NASAL) 55 MCG/ACT NA INHA: 2.0000 | Freq: Every day | NASAL | Status: DC

## 2012-09-05 MED ORDER — OXYCODONE-ACETAMINOPHEN 5-325 MG PO TABS: 1.0000 | ORAL_TABLET | Freq: Once | ORAL | Status: DC

## 2012-09-05 MED ORDER — ANTIPYRINE-BENZOCAINE 5.4-1.4 % OT SOLN
3.0000 [drp] | Freq: Once | OTIC | Status: AC
  Administered 2012-09-05: 4 [drp] via OTIC
  Filled 2012-09-05: qty 10

## 2012-09-05 MED ORDER — LORATADINE 10 MG PO TABS: 10.0000 mg | ORAL_TABLET | Freq: Every day | ORAL | Status: DC

## 2012-09-05 MED ORDER — OXYCODONE-ACETAMINOPHEN 5-325 MG PO TABS
1.0000 | ORAL_TABLET | Freq: Once | ORAL | Status: AC
  Administered 2012-09-05: 1 via ORAL
  Filled 2012-09-05: qty 1

## 2012-09-05 NOTE — ED Notes (Addendum)
Pt presents to ED with bilateral ear pain x1 day.  Pt reports that the L started hurting first yesterday morning and the R started this morning.  Reports intermittent sharp pain 7/10.  Pt reports hx of earaches stating "I get them all the time after a cold."  Denies hearing impairment.

## 2012-09-05 NOTE — ED Notes (Signed)
Pt c/o of increased pain to both ears after administration of ear drops. PA notified.

## 2012-09-05 NOTE — ED Provider Notes (Signed)
History     CSN: [id number]  Arrival date & time 09/05/12  1121   First MD Initiated Contact with Patient 09/05/12 1142      Chief Complaint  Patient presents with  . Otalgia    (Consider location/radiation/quality/duration/timing/severity/associated sxs/prior treatment) Patient is a 46 y.o. female presenting with ear pain. The history is provided by the patient.  Otalgia This is a new problem. The current episode started yesterday. There is pain in both ears. The problem occurs constantly. There has been no fever. Associated symptoms include rhinorrhea. Pertinent negatives include no cough and no rash. Associated symptoms comments: Severe pain in both ears similar to previous episodes of ear pain associated with sinus congestion. No fever. No drainage from ears or change in hearing. No cough or sore throat. .    Past Medical History  Diagnosis Date  . Hypertension   . Vitamin D deficiency   . Gallstones   . Breast cancer 2010    s/p mastectomy  . Sinus infection     pt is on Doxycycline  . History of blood clots     neck-after reconstructive surgery  . Chronic back pain     bulding/herniated disc  . Hemorrhoids   . Constipation     related to pain meds and takes stool softener prn  . Nocturia   . Diabetes mellitus     takes Saxagliptin-Metformin daily  . Lyme disease     taking Doxycycline-to finish up 06/03/12    Past Surgical History  Procedure Date  . Cesarean section 1992/1995  . Mastectomy 04/27/10    right  . Breast surgery   . Breast reconstruction 7/12  . Port a cath placed 2011  . Port a cath removed   . Lumbar laminectomy/decompression microdiscectomy 06/04/2012    Procedure: LUMBAR LAMINECTOMY/DECOMPRESSION MICRODISCECTOMY 1 LEVEL;  Surgeon: Venita Lick, MD;  Location: [facility] OR;  Service: Orthopedics;  Laterality: N/A;  L4-5 BILATERAL HEMILAMINECTOMY/MICRODISCECTOMY  . Back surgery     Family History  Problem Relation Age of Onset  . Diabetes Mother    . Diabetes Sister     History  Substance Use Topics  . Smoking status: Current Every Day Smoker -- 1.0 packs/day for 28 years    Types: Cigarettes  . Smokeless tobacco: Not on file  . Alcohol Use: No    OB History    Grav Para Term Preterm Abortions TAB SAB Ect Mult Living                  Review of Systems  HENT: Positive for ear pain, rhinorrhea and sinus pressure.   Respiratory: Negative for cough.   Gastrointestinal: Negative for nausea.  Musculoskeletal: Negative for myalgias.  Skin: Negative for rash.    Allergies  Review of patient's allergies indicates no known allergies.  Home Medications   Current Outpatient Rx  Name  Route  Sig  Dispense  Refill  . CYCLOBENZAPRINE HCL 5 MG PO TABS   Oral   Take 5 mg by mouth 3 (three) times daily as needed. For muscle spasms         . DAYQUIL PO   Oral   Take 1 capsule by mouth every 4 (four) hours as needed. cold         . SITAGLIPTIN-METFORMIN HCL 50-1000 MG PO TABS   Oral   Take 1 tablet by mouth 2 (two) times daily with a meal.           BP 142/94  Pulse 88  Temp 98.1 F (36.7 C)  SpO2 100%  LMP 08/15/2012  Physical Exam  Constitutional: She appears well-developed and well-nourished.  HENT:  Head: Normocephalic.       Bilateral TM are not red or bulging. There is clear fluid in middle ear on both sides. Left greater than right changes of scarring. External canals bilaterally clear.   Neck: Normal range of motion. Neck supple.  Cardiovascular: Normal rate and regular rhythm.   Pulmonary/Chest: Effort normal and breath sounds normal.  Abdominal: Soft. Bowel sounds are normal. There is no tenderness. There is no rebound and no guarding.  Musculoskeletal: Normal range of motion.  Neurological: She is alert. No cranial nerve deficit.  Skin: Skin is warm and dry. No rash noted.  Psychiatric: She has a normal mood and affect.    ED Course  Procedures (including critical care time)  Labs Reviewed    GLUCOSE, CAPILLARY - Abnormal; Notable for the following:    Glucose-Capillary 212 (*)     All other components within normal limits   No results found.   No diagnosis found. 1. Otalgia   MDM  Antipyrene without relief. Patient given Percocet. Recommend steroid nasal spray and antihistamines. Follow up with ENT        Rodena Medin, PA-C 09/05/12 1343

## 2012-09-06 NOTE — ED Provider Notes (Signed)
Medical screening examination/treatment/procedure(s) were performed by non-physician practitioner and as supervising physician I was immediately available for consultation/collaboration.   Brodey Bonn T Isacc Turney, MD 09/06/12 0846 

## 2012-09-07 ENCOUNTER — Emergency Department (HOSPITAL_COMMUNITY)
Admission: EM | Admit: 2012-09-07 | Discharge: 2012-09-07 | Disposition: A | Discharge status: Home / Self Care | Payer: Managed Care, Other (non HMO) | Attending: Emergency Medicine | Admitting: Emergency Medicine

## 2012-09-07 ENCOUNTER — Encounter (HOSPITAL_COMMUNITY): Payer: Self-pay | Admitting: Emergency Medicine

## 2012-09-07 DIAGNOSIS — Z862 Personal history of diseases of the blood and blood-forming organs and certain disorders involving the immune mechanism: Secondary | ICD-10-CM | POA: Insufficient documentation

## 2012-09-07 DIAGNOSIS — G8929 Other chronic pain: Secondary | ICD-10-CM | POA: Insufficient documentation

## 2012-09-07 DIAGNOSIS — IMO0002 Reserved for concepts with insufficient information to code with codable children: Secondary | ICD-10-CM | POA: Insufficient documentation

## 2012-09-07 DIAGNOSIS — Z8619 Personal history of other infectious and parasitic diseases: Secondary | ICD-10-CM | POA: Insufficient documentation

## 2012-09-07 DIAGNOSIS — Z79899 Other long term (current) drug therapy: Secondary | ICD-10-CM | POA: Insufficient documentation

## 2012-09-07 DIAGNOSIS — Z87898 Personal history of other specified conditions: Secondary | ICD-10-CM | POA: Insufficient documentation

## 2012-09-07 DIAGNOSIS — Z853 Personal history of malignant neoplasm of breast: Secondary | ICD-10-CM | POA: Insufficient documentation

## 2012-09-07 DIAGNOSIS — Z8719 Personal history of other diseases of the digestive system: Secondary | ICD-10-CM | POA: Insufficient documentation

## 2012-09-07 DIAGNOSIS — Z8639 Personal history of other endocrine, nutritional and metabolic disease: Secondary | ICD-10-CM | POA: Insufficient documentation

## 2012-09-07 DIAGNOSIS — K644 Residual hemorrhoidal skin tags: Secondary | ICD-10-CM

## 2012-09-07 DIAGNOSIS — F17200 Nicotine dependence, unspecified, uncomplicated: Secondary | ICD-10-CM | POA: Insufficient documentation

## 2012-09-07 DIAGNOSIS — I10 Essential (primary) hypertension: Secondary | ICD-10-CM | POA: Insufficient documentation

## 2012-09-07 DIAGNOSIS — M549 Dorsalgia, unspecified: Secondary | ICD-10-CM | POA: Insufficient documentation

## 2012-09-07 DIAGNOSIS — Z8679 Personal history of other diseases of the circulatory system: Secondary | ICD-10-CM | POA: Insufficient documentation

## 2012-09-07 DIAGNOSIS — E119 Type 2 diabetes mellitus without complications: Secondary | ICD-10-CM | POA: Insufficient documentation

## 2012-09-07 MED ORDER — HYDROCORTISONE ACE-PRAMOXINE 1-1 % RE FOAM: 1.0000 | Freq: Two times a day (BID) | RECTAL | Status: DC

## 2012-09-07 NOTE — ED Provider Notes (Signed)
History     CSN: [id number]  Arrival date & time 09/07/12  9562   First MD Initiated Contact with Patient 09/07/12 [phone]      Chief Complaint  Patient presents with  . GI Bleeding    (Consider location/radiation/quality/duration/timing/severity/associated sxs/prior treatment) The history is provided by the patient.   Patient here complaining of bright red blood per rectum which began today. History of similar symptoms associated with external hemorrhoids. Did have symptoms earlier during this week due to constipation. They resolved but she was recently placed on narcotics which have made him worse. Denies abdominal pain, fever, vomiting. She did try using over-the-counter meds without success. Today which move her bowels she saw bright red blood in the toilet. Denies any vaginal bleeding. Symptoms have slowed down. Past Medical History  Diagnosis Date  . Hypertension   . Vitamin D deficiency   . Gallstones   . Breast cancer 2010    s/p mastectomy  . Sinus infection     pt is on Doxycycline  . History of blood clots     neck-after reconstructive surgery  . Chronic back pain     bulding/herniated disc  . Hemorrhoids   . Constipation     related to pain meds and takes stool softener prn  . Nocturia   . Diabetes mellitus     takes Saxagliptin-Metformin daily  . Lyme disease     taking Doxycycline-to finish up 06/03/12    Past Surgical History  Procedure Date  . Cesarean section 1992/1995  . Mastectomy 04/27/10    right  . Breast surgery   . Breast reconstruction 7/12  . Port a cath placed 2011  . Port a cath removed   . Lumbar laminectomy/decompression microdiscectomy 06/04/2012    Procedure: LUMBAR LAMINECTOMY/DECOMPRESSION MICRODISCECTOMY 1 LEVEL;  Surgeon: Venita Lick, MD;  Location: [facility] OR;  Service: Orthopedics;  Laterality: N/A;  L4-5 BILATERAL HEMILAMINECTOMY/MICRODISCECTOMY  . Back surgery     Family History  Problem Relation Age of Onset  . Diabetes Mother    . Diabetes Sister     History  Substance Use Topics  . Smoking status: Current Every Day Smoker -- 1.0 packs/day for 28 years    Types: Cigarettes  . Smokeless tobacco: Not on file  . Alcohol Use: No    OB History    Grav Para Term Preterm Abortions TAB SAB Ect Mult Living                  Review of Systems  All other systems reviewed and are negative.    Allergies  Review of patient's allergies indicates no known allergies.  Home Medications   Current Outpatient Rx  Name  Route  Sig  Dispense  Refill  . CYCLOBENZAPRINE HCL 5 MG PO TABS   Oral   Take 5 mg by mouth 3 (three) times daily as needed. For muscle spasms         . LORATADINE 10 MG PO TABS   Oral   Take 1 tablet (10 mg total) by mouth daily.   7 tablet   0   . OXYCODONE-ACETAMINOPHEN 5-325 MG PO TABS   Oral   Take 1 tablet by mouth once.   15 tablet   0   . SITAGLIPTIN-METFORMIN HCL 50-1000 MG PO TABS   Oral   Take 1 tablet by mouth 2 (two) times daily with a meal.         . TRIAMCINOLONE ACETONIDE 55 MCG/ACT NA INHA  Nasal   Place 2 sprays into the nose daily.   1 Inhaler   12     BP 151/94  Pulse 115  Temp 98.2 F (36.8 C)  SpO2 95%  LMP 08/15/2012  Physical Exam  Nursing note and vitals reviewed. Constitutional: She is oriented to person, place, and time. She appears well-developed and well-nourished.  Non-toxic appearance. No distress.  HENT:  Head: Normocephalic and atraumatic.  Eyes: Conjunctivae normal, EOM and lids are normal. Pupils are equal, round, and reactive to light.  Neck: Normal range of motion. Neck supple. No tracheal deviation present. No mass present.  Cardiovascular: Regular rhythm and normal heart sounds.  Tachycardia present.  Exam reveals no gallop.   No murmur heard. Pulmonary/Chest: Effort normal and breath sounds normal. No stridor. No respiratory distress. She has no decreased breath sounds. She has no wheezes. She has no rhonchi. She has no rales.    Abdominal: Soft. Normal appearance and bowel sounds are normal. She exhibits no distension. There is no tenderness. There is no rebound and no CVA tenderness.  Genitourinary: Rectal exam shows external hemorrhoid.  Musculoskeletal: Normal range of motion. She exhibits no edema and no tenderness.  Neurological: She is alert and oriented to person, place, and time. She has normal strength. No cranial nerve deficit or sensory deficit. GCS eye subscore is 4. GCS verbal subscore is 5. GCS motor subscore is 6.  Skin: Skin is warm and dry. No abrasion and no rash noted.  Psychiatric: She has a normal mood and affect. Her speech is normal and behavior is normal.    ED Course  Procedures (including critical care time)  Labs Reviewed - No data to display No results found.   No diagnosis found.    MDM  Pt with ext hemorrhoids. wil rx proctofoam and give surgery referral        Toy Baker, MD 09/07/12 [phone]

## 2012-09-07 NOTE — ED Notes (Addendum)
Pt c/o of blood in stool that is bright red. Started on Wednesday spotting but today the amount increased. Pt states that she has been constipated and started taking stool softeners and today she noticed an increase in the amount of blood with no straining. Denies dizziness, headache but c/o of abdominal pain when she has to use restroom. Pt also has a hx of hemorrhoids and c/o of burning.

## 2012-09-17 ENCOUNTER — Ambulatory Visit (INDEPENDENT_AMBULATORY_CARE_PROVIDER_SITE_OTHER): Payer: Self-pay | Admitting: General Surgery

## 2012-10-15 ENCOUNTER — Encounter (INDEPENDENT_AMBULATORY_CARE_PROVIDER_SITE_OTHER): Payer: Self-pay | Admitting: General Surgery

## 2012-10-30 DIAGNOSIS — E119 Type 2 diabetes mellitus without complications: Secondary | ICD-10-CM | POA: Insufficient documentation

## 2012-10-30 DIAGNOSIS — H9209 Otalgia, unspecified ear: Secondary | ICD-10-CM | POA: Insufficient documentation

## 2012-10-30 DIAGNOSIS — Z86718 Personal history of other venous thrombosis and embolism: Secondary | ICD-10-CM | POA: Insufficient documentation

## 2012-10-30 DIAGNOSIS — F17200 Nicotine dependence, unspecified, uncomplicated: Secondary | ICD-10-CM | POA: Insufficient documentation

## 2012-10-30 DIAGNOSIS — I10 Essential (primary) hypertension: Secondary | ICD-10-CM | POA: Insufficient documentation

## 2012-10-30 DIAGNOSIS — Z853 Personal history of malignant neoplasm of breast: Secondary | ICD-10-CM | POA: Insufficient documentation

## 2012-10-30 DIAGNOSIS — Z8739 Personal history of other diseases of the musculoskeletal system and connective tissue: Secondary | ICD-10-CM | POA: Insufficient documentation

## 2012-10-30 DIAGNOSIS — Z8639 Personal history of other endocrine, nutritional and metabolic disease: Secondary | ICD-10-CM | POA: Insufficient documentation

## 2012-10-30 DIAGNOSIS — K1370 Unspecified lesions of oral mucosa: Secondary | ICD-10-CM | POA: Insufficient documentation

## 2012-10-30 DIAGNOSIS — Z8719 Personal history of other diseases of the digestive system: Secondary | ICD-10-CM | POA: Insufficient documentation

## 2012-10-30 DIAGNOSIS — Z862 Personal history of diseases of the blood and blood-forming organs and certain disorders involving the immune mechanism: Secondary | ICD-10-CM | POA: Insufficient documentation

## 2012-10-31 ENCOUNTER — Emergency Department (HOSPITAL_COMMUNITY)
Admission: EM | Admit: 2012-10-31 | Discharge: 2012-10-31 | Disposition: A | Discharge status: Home / Self Care | Payer: Managed Care, Other (non HMO) | Attending: Emergency Medicine | Admitting: Emergency Medicine

## 2012-10-31 ENCOUNTER — Encounter (HOSPITAL_COMMUNITY): Payer: Self-pay | Admitting: *Deleted

## 2012-10-31 DIAGNOSIS — K1379 Other lesions of oral mucosa: Secondary | ICD-10-CM

## 2012-10-31 MED ORDER — OXYCODONE-ACETAMINOPHEN 5-325 MG PO TABS: 1.0000 | ORAL_TABLET | ORAL | Status: DC | PRN

## 2012-10-31 MED ORDER — OXYCODONE-ACETAMINOPHEN 5-325 MG PO TABS
2.0000 | ORAL_TABLET | Freq: Once | ORAL | Status: AC
  Administered 2012-10-31: 2 via ORAL
  Filled 2012-10-31: qty 2

## 2012-10-31 NOTE — ED Provider Notes (Signed)
History     CSN: [id number]  Arrival date & time 10/30/12  2338   First MD Initiated Contact with Patient 10/31/12 0259      Chief Complaint  Patient presents with  . Facial Pain   HPI  History provided by the patient. Patient is a 47 year old female with past history of breast cancer in remission, hypertension, and diabetes who presents with complaints of left face pain. Patient reports symptoms began to increase around 5 PM in the evening. She reports having left facial pain that feels like it's "coming from my tooth". Pain radiates to left ear area. Pain is slightly worse with pressure on the left upper mouth. Patient has used Orajel and over-the-counter pain medicines for symptoms. She reports having some relief of pains from Orajel but this lasts only a few minutes. Patient has not used any other treatment for symptoms. Denies any other aggravating or alleviating factors. Denies any other associated symptoms.      Past Medical History  Diagnosis Date  . Hypertension   . Vitamin D deficiency   . Gallstones   . Breast cancer 2010    s/p mastectomy  . Sinus infection     pt is on Doxycycline  . History of blood clots     neck-after reconstructive surgery  . Chronic back pain     bulding/herniated disc  . Hemorrhoids   . Constipation     related to pain meds and takes stool softener prn  . Nocturia   . Diabetes mellitus     takes Saxagliptin-Metformin daily  . Lyme disease     taking Doxycycline-to finish up 06/03/12    Past Surgical History  Procedure Date  . Cesarean section 1992/1995  . Mastectomy 04/27/10    right  . Breast surgery   . Breast reconstruction 7/12  . Port a cath placed 2011  . Port a cath removed   . Lumbar laminectomy/decompression microdiscectomy 06/04/2012    Procedure: LUMBAR LAMINECTOMY/DECOMPRESSION MICRODISCECTOMY 1 LEVEL;  Surgeon: Venita Lick, MD;  Location: [facility] OR;  Service: Orthopedics;  Laterality: N/A;  L4-5 BILATERAL  HEMILAMINECTOMY/MICRODISCECTOMY  . Back surgery     Family History  Problem Relation Age of Onset  . Diabetes Mother   . Diabetes Sister     History  Substance Use Topics  . Smoking status: Current Every Day Smoker -- 1.0 packs/day for 28 years    Types: Cigarettes  . Smokeless tobacco: Not on file  . Alcohol Use: No    OB History    Grav Para Term Preterm Abortions TAB SAB Ect Mult Living                  Review of Systems  Constitutional: Negative for fever and chills.  HENT: Positive for ear pain and dental problem. Negative for hearing loss, facial swelling, trouble swallowing, voice change, tinnitus and ear discharge.   Neurological: Negative for facial asymmetry, weakness, numbness and headaches.  All other systems reviewed and are negative.    Allergies  Review of patient's allergies indicates no known allergies.  Home Medications   Current Outpatient Rx  Name  Route  Sig  Dispense  Refill  . SITAGLIPTIN-METFORMIN HCL 50-1000 MG PO TABS   Oral   Take 1 tablet by mouth 2 (two) times daily with a meal.           BP 148/90  Pulse 89  Resp 15  Ht 5\' 2"  (1.575 m)  Wt 148 lb (67.132  kg)  BMI 27.07 kg/m2  SpO2 97%  LMP 11/08/2011  Physical Exam  Nursing note and vitals reviewed. Constitutional: She is oriented to person, place, and time. She appears well-developed and well-nourished. No distress.  HENT:  Head: Normocephalic.  Right Ear: Tympanic membrane normal.  Left Ear: Tympanic membrane is perforated.       Pain to palpation over the gums of the left upper mouth overlying the canine and premolar teeth. No significant swelling or fluctuance. No lesions or sores. No bleeding or easy friability of the gums. No pain to percussion over the teeth. Multiple dental fillings to molar teeth throughout.  Neck: Normal range of motion. Neck supple.  Cardiovascular: Normal rate and regular rhythm.   Pulmonary/Chest: Effort normal and breath sounds normal.    Abdominal: Soft.  Lymphadenopathy:    She has no cervical adenopathy.  Neurological: She is alert and oriented to person, place, and time. She has normal strength. No cranial nerve deficit or sensory deficit.  Skin: Skin is warm and dry. No rash noted.  Psychiatric: She has a normal mood and affect. Her behavior is normal.    ED Course  Procedures       1. Acute oral pain   2. Facial pain       MDM  3:20AM patient seen and evaluated. Patient tearful and appears uncomfortable.  Skin appears normal. Patient does have pain to pressure above the left gums overlying the canine and premolar teeth. There is no swelling or fluctuance. No lesions or significant erythema. No friability the gums. No pain to percussion of the teeth. Symptoms and findings appear to be dental in origin. Trigeminal neuralgia not completely excluded as a possibility. This is patient's first episode of these symptoms. At this time plan to treat symptomatically with dental referral as well as PCP followup.        Angus Seller, [place] 11/01/12 [phone]

## 2012-10-31 NOTE — ED Notes (Signed)
Pt verbalizes understanding 

## 2012-10-31 NOTE — ED Notes (Signed)
Pt c/o left side facial pain.  Pt reports "i am not sure if its a tooth or my ear that's hurting."

## 2012-10-31 NOTE — ED Notes (Addendum)
Pt c/o L sided facial pain, sudden onset tonight. Pt has small amount of rash to L side of face. Pt states she has had pain similar to this and associates it with her teeth.

## 2012-11-01 NOTE — ED Provider Notes (Signed)
Medical screening examination/treatment/procedure(s) were performed by non-physician practitioner and as supervising physician I was immediately available for consultation/collaboration.  Keaton Stirewalt M Seana Underwood, MD 11/01/12 0549 

## 2012-12-03 ENCOUNTER — Emergency Department (HOSPITAL_COMMUNITY)
Admission: EM | Admit: 2012-12-03 | Discharge: 2012-12-03 | Disposition: A | Discharge status: Home / Self Care | Payer: Managed Care, Other (non HMO) | Attending: Emergency Medicine | Admitting: Emergency Medicine

## 2012-12-03 DIAGNOSIS — G8929 Other chronic pain: Secondary | ICD-10-CM | POA: Insufficient documentation

## 2012-12-03 DIAGNOSIS — Z79899 Other long term (current) drug therapy: Secondary | ICD-10-CM | POA: Insufficient documentation

## 2012-12-03 DIAGNOSIS — K089 Disorder of teeth and supporting structures, unspecified: Secondary | ICD-10-CM | POA: Insufficient documentation

## 2012-12-03 DIAGNOSIS — Z862 Personal history of diseases of the blood and blood-forming organs and certain disorders involving the immune mechanism: Secondary | ICD-10-CM | POA: Insufficient documentation

## 2012-12-03 DIAGNOSIS — Z8719 Personal history of other diseases of the digestive system: Secondary | ICD-10-CM | POA: Insufficient documentation

## 2012-12-03 DIAGNOSIS — Z8679 Personal history of other diseases of the circulatory system: Secondary | ICD-10-CM | POA: Insufficient documentation

## 2012-12-03 DIAGNOSIS — E119 Type 2 diabetes mellitus without complications: Secondary | ICD-10-CM | POA: Insufficient documentation

## 2012-12-03 DIAGNOSIS — F17200 Nicotine dependence, unspecified, uncomplicated: Secondary | ICD-10-CM | POA: Insufficient documentation

## 2012-12-03 DIAGNOSIS — K0889 Other specified disorders of teeth and supporting structures: Secondary | ICD-10-CM

## 2012-12-03 DIAGNOSIS — Z853 Personal history of malignant neoplasm of breast: Secondary | ICD-10-CM | POA: Insufficient documentation

## 2012-12-03 DIAGNOSIS — I10 Essential (primary) hypertension: Secondary | ICD-10-CM | POA: Insufficient documentation

## 2012-12-03 DIAGNOSIS — R51 Headache: Secondary | ICD-10-CM | POA: Insufficient documentation

## 2012-12-03 DIAGNOSIS — Z872 Personal history of diseases of the skin and subcutaneous tissue: Secondary | ICD-10-CM | POA: Insufficient documentation

## 2012-12-03 DIAGNOSIS — M549 Dorsalgia, unspecified: Secondary | ICD-10-CM | POA: Insufficient documentation

## 2012-12-03 DIAGNOSIS — Z8619 Personal history of other infectious and parasitic diseases: Secondary | ICD-10-CM | POA: Insufficient documentation

## 2012-12-03 MED ORDER — CHLORHEXIDINE GLUCONATE 0.12 % MT SOLN: 15.0000 mL | Freq: Two times a day (BID) | OROMUCOSAL | Status: DC

## 2012-12-03 MED ORDER — PENICILLIN V POTASSIUM 500 MG PO TABS: 500.0000 mg | ORAL_TABLET | Freq: Four times a day (QID) | ORAL | Status: DC

## 2012-12-03 MED ORDER — OXYCODONE-ACETAMINOPHEN 5-325 MG PO TABS: 2.0000 | ORAL_TABLET | ORAL | Status: DC | PRN

## 2012-12-03 MED ORDER — OXYCODONE-ACETAMINOPHEN 5-325 MG PO TABS
2.0000 | ORAL_TABLET | Freq: Once | ORAL | Status: AC
  Administered 2012-12-03: 2 via ORAL
  Filled 2012-12-03: qty 2

## 2012-12-03 MED ORDER — ONDANSETRON 4 MG PO TBDP
4.0000 mg | ORAL_TABLET | Freq: Once | ORAL | Status: AC
  Administered 2012-12-03: 4 mg via ORAL
  Filled 2012-12-03: qty 1

## 2012-12-03 NOTE — ED Notes (Signed)
Toothache: 2nd tooth from the back on upper side. Called dentist today but not able to get seen. Suppose to go have oral surgery to have tooth removal. Appt. Next Friday.

## 2012-12-03 NOTE — ED Provider Notes (Signed)
History  This chart was scribed for non-physician practitioner working with Doug Sou, MD by Ardeen Jourdain, ED Scribe. This patient was seen in room [facility] and the patient's care was started at 2106.  CSN: [id number]  Arrival date & time 12/03/12  2032   First MD Initiated Contact with Patient 12/03/12 2106      Chief Complaint  Patient presents with  . Dental Pain     Patient is a 47 y.o. female presenting with tooth pain. The history is provided by the patient. No language interpreter was used.  Dental PainThe primary symptoms include mouth pain and headaches. Primary symptoms do not include dental injury, oral bleeding, oral lesions, fever, shortness of breath, sore throat, angioedema or cough. The symptoms began more than 1 week ago. The symptoms are unchanged. The symptoms are recurrent. The symptoms occur constantly.  Mouth pain began more than 1 week ago. Mouth pain occurs constantly. Mouth pain is unchanged. Affected locations include: teeth.  The headache began more than 2 days ago. The headache developed gradually. Headache is a new problem. The headache is present intermittently. The headache is not associated with aura, photophobia, eye pain, visual change, neck stiffness, paresthesias, weakness or loss of balance.  Additional symptoms do not include: dental sensitivity to temperature, purulent gums, facial swelling, trouble swallowing, pain with swallowing, excessive salivation, dry mouth and drooling.    Sherry [name] is a 47 y.o. female who presents to the Emergency Department complaining of upper left jaw pain from an infected tooth. She states she is supposed to have the tooth removed but is waiting to have the tooth deep cleaned before. She states she took a 400 mg ibuprofen with no relief. She states she was prescribed amoxicillin for the infection when she was first seen for the pain. She states the pain was relived after taking it but that it has come back. She  denies any difficulty breathing or swallowing.    Past Medical History  Diagnosis Date  . Hypertension   . Vitamin D deficiency   . Gallstones   . Breast cancer 2010    s/p mastectomy  . Sinus infection     pt is on Doxycycline  . History of blood clots     neck-after reconstructive surgery  . Chronic back pain     bulding/herniated disc  . Hemorrhoids   . Constipation     related to pain meds and takes stool softener prn  . Nocturia   . Diabetes mellitus     takes Saxagliptin-Metformin daily  . Lyme disease     taking Doxycycline-to finish up 06/03/12    Past Surgical History  Procedure Laterality Date  . Cesarean section  1992/1995  . Mastectomy  04/27/10    right  . Breast surgery    . Breast reconstruction  7/12  . Port a cath placed  2011  . Port a cath removed    . Lumbar laminectomy/decompression microdiscectomy  06/04/2012    Procedure: LUMBAR LAMINECTOMY/DECOMPRESSION MICRODISCECTOMY 1 LEVEL;  Surgeon: Venita Lick, MD;  Location: [facility] OR;  Service: Orthopedics;  Laterality: N/A;  L4-5 BILATERAL HEMILAMINECTOMY/MICRODISCECTOMY  . Back surgery      Family History  Problem Relation Age of Onset  . Diabetes Mother   . Diabetes Sister     History  Substance Use Topics  . Smoking status: Current Every Day Smoker -- 1.00 packs/day for 28 years    Types: Cigarettes  . Smokeless tobacco: Not on file  .  Alcohol Use: No   No OB history available.   Review of Systems  Constitutional: Negative for fever and chills.  HENT: Positive for dental problem. Negative for sore throat, facial swelling, drooling, trouble swallowing and neck stiffness.   Eyes: Negative for photophobia and pain.  Respiratory: Negative for cough and shortness of breath.   Gastrointestinal: Negative for nausea and vomiting.  Neurological: Positive for headaches. Negative for weakness, paresthesias and loss of balance.  All other systems reviewed and are negative.    Allergies  Review of  patient's allergies indicates no known allergies.  Home Medications   Current Outpatient Rx  Name  Route  Sig  Dispense  Refill  . cyclobenzaprine (FLEXERIL) 5 MG tablet   Oral   Take 5 mg by mouth daily as needed for muscle spasms. For muscle spasms         . ibuprofen (ADVIL,MOTRIN) 200 MG tablet   Oral   Take 400 mg by mouth every 6 (six) hours as needed for pain. For pain         . sitaGLIPtan-metformin (JANUMET) 50-1000 MG per tablet   Oral   Take 1 tablet by mouth 2 (two) times daily with a meal.         . chlorhexidine (PERIDEX) 0.12 % solution   Mouth/Throat   Use as directed 15 mLs in the mouth or throat 2 (two) times daily.   120 mL   0   . oxyCODONE-acetaminophen (PERCOCET) 5-325 MG per tablet   Oral   Take 2 tablets by mouth every 4 (four) hours as needed for pain.   10 tablet   0   . penicillin v potassium (VEETID) 500 MG tablet   Oral   Take 1 tablet (500 mg total) by mouth 4 (four) times daily.   40 tablet   0     Triage Vitals: BP 145/92  Pulse 95  Temp(Src) 98.2 F (36.8 C) (Oral)  Resp 20  SpO2 98%  LMP 11/26/2012  Physical Exam  Nursing note and vitals reviewed. Constitutional: She is oriented to person, place, and time. She appears well-developed and well-nourished. No distress.  HENT:  Head: Normocephalic and atraumatic.  Mouth/Throat: Oropharynx is clear and moist. No oropharyngeal exudate.  Multiple dental carries, no apparent fluctuant abscess, no pharyngeal erythema or swelling, no gingival erythema, uvula midline, airway patent, no trismus   Eyes: EOM are normal. Pupils are equal, round, and reactive to light.  Neck: Normal range of motion. Neck supple. No tracheal deviation present.  Cardiovascular: Normal rate.   Pulmonary/Chest: Effort normal. No respiratory distress.  Abdominal: Soft. She exhibits no distension.  Musculoskeletal: Normal range of motion. She exhibits no edema.  Neurological: She is alert and oriented to  person, place, and time.  Skin: Skin is warm and dry.  Psychiatric: She has a normal mood and affect. Her behavior is normal.    ED Course  Procedures (including critical care time)  DIAGNOSTIC STUDIES: Oxygen Saturation is 98% on room air, normal by my interpretation.    COORDINATION OF CARE:  10:24 PM: Discussed treatment plan which includes antibiotics and pain medication with pt at bedside and pt agreed to plan.     Labs Reviewed - No data to display No results found.   1. Pain, dental       MDM  Patient with toothache.  No gross abscess.  Exam unconcerning for Ludwig's angina or spread of infection.  Will treat with penicillin and pain medicine.  Urged patient to follow-up with dentist.      I personally performed the services described in this documentation, which was scribed in my presence. The recorded information has been reviewed and is accurate.    Arthor Captain, PA-C 12/04/12 0010

## 2012-12-03 NOTE — ED Notes (Signed)
Pt reports 7/10 pain to upper left jaw. Is suppose to have tooth removed, but waiting for teeth to be deep cleaned a week from Friday. Requesting something for pain. Took 400 mg Ibuprofen today with no relief

## 2012-12-04 NOTE — ED Provider Notes (Signed)
Medical screening examination/treatment/procedure(s) were performed by non-physician practitioner and as supervising physician I was immediately available for consultation/collaboration.  Doug Sou, MD 12/04/12 [phone]

## 2012-12-28 ENCOUNTER — Emergency Department (HOSPITAL_COMMUNITY)
Admission: EM | Admit: 2012-12-28 | Discharge: 2012-12-28 | Disposition: A | Discharge status: Home / Self Care | Payer: Managed Care, Other (non HMO) | Attending: Emergency Medicine | Admitting: Emergency Medicine

## 2012-12-28 DIAGNOSIS — I10 Essential (primary) hypertension: Secondary | ICD-10-CM | POA: Insufficient documentation

## 2012-12-28 DIAGNOSIS — J329 Chronic sinusitis, unspecified: Secondary | ICD-10-CM | POA: Insufficient documentation

## 2012-12-28 DIAGNOSIS — Z79899 Other long term (current) drug therapy: Secondary | ICD-10-CM | POA: Insufficient documentation

## 2012-12-28 DIAGNOSIS — Z8719 Personal history of other diseases of the digestive system: Secondary | ICD-10-CM | POA: Insufficient documentation

## 2012-12-28 DIAGNOSIS — E119 Type 2 diabetes mellitus without complications: Secondary | ICD-10-CM | POA: Insufficient documentation

## 2012-12-28 DIAGNOSIS — H9209 Otalgia, unspecified ear: Secondary | ICD-10-CM | POA: Insufficient documentation

## 2012-12-28 DIAGNOSIS — F17200 Nicotine dependence, unspecified, uncomplicated: Secondary | ICD-10-CM | POA: Insufficient documentation

## 2012-12-28 DIAGNOSIS — K089 Disorder of teeth and supporting structures, unspecified: Secondary | ICD-10-CM | POA: Insufficient documentation

## 2012-12-28 DIAGNOSIS — Z8619 Personal history of other infectious and parasitic diseases: Secondary | ICD-10-CM | POA: Insufficient documentation

## 2012-12-28 DIAGNOSIS — Z853 Personal history of malignant neoplasm of breast: Secondary | ICD-10-CM | POA: Insufficient documentation

## 2012-12-28 DIAGNOSIS — M549 Dorsalgia, unspecified: Secondary | ICD-10-CM | POA: Insufficient documentation

## 2012-12-28 DIAGNOSIS — G8929 Other chronic pain: Secondary | ICD-10-CM | POA: Insufficient documentation

## 2012-12-28 MED ORDER — PENICILLIN V POTASSIUM 500 MG PO TABS
500.0000 mg | ORAL_TABLET | Freq: Three times a day (TID) | ORAL | Status: DC
Start: 2012-12-28 — End: 2013-06-18

## 2012-12-28 MED ORDER — PENICILLIN V POTASSIUM 500 MG PO TABS
500.0000 mg | ORAL_TABLET | Freq: Four times a day (QID) | ORAL | Status: DC
  Administered 2012-12-28: 500 mg via ORAL
  Filled 2012-12-28: qty 1

## 2012-12-28 MED ORDER — OXYCODONE-ACETAMINOPHEN 5-325 MG PO TABS: 1.0000 | ORAL_TABLET | ORAL | Status: DC | PRN

## 2012-12-28 MED ORDER — OXYCODONE-ACETAMINOPHEN 5-325 MG PO TABS
1.0000 | ORAL_TABLET | Freq: Once | ORAL | Status: AC
  Administered 2012-12-28: 1 via ORAL
  Filled 2012-12-28: qty 1

## 2012-12-28 NOTE — ED Provider Notes (Signed)
History    This chart was scribed for non-physician practitioner Elpidio Anis, PA-C working with Gilda Crease, MD by Gerlean Ren, ED Scribe. This patient was seen in room WTR9/[facility] and the patient's care was started at 6:51 PM.    CSN: [id number]  Arrival date & time 12/28/12  1537   First MD Initiated Contact with Patient 12/28/12 1811      Chief Complaint  Patient presents with  . Dental Pain    The history is provided by the patient. No language interpreter was used.  Sherry [name] is a 47 y.o. female who presents to the Emergency Department complaining of constant left upper dental pain causing left ear pain.  Pt reports she has been having trouble with this tooth (seen one month ago) and that she has an appointment to have the tooth removed that was rescheduled due to weather.  Pt reports she has had XR and an abscess was identified.  Pt states she received antibiotics when seen here one month ago that she completed.   Past Medical History  Diagnosis Date  . Hypertension   . Vitamin D deficiency   . Gallstones   . Breast cancer 2010    s/p mastectomy  . Sinus infection     pt is on Doxycycline  . History of blood clots     neck-after reconstructive surgery  . Chronic back pain     bulding/herniated disc  . Hemorrhoids   . Constipation     related to pain meds and takes stool softener prn  . Nocturia   . Diabetes mellitus     takes Saxagliptin-Metformin daily  . Lyme disease     taking Doxycycline-to finish up 06/03/12    Past Surgical History  Procedure Laterality Date  . Cesarean section  1992/1995  . Mastectomy  04/27/10    right  . Breast surgery    . Breast reconstruction  7/12  . Port a cath placed  2011  . Port a cath removed    . Lumbar laminectomy/decompression microdiscectomy  06/04/2012    Procedure: LUMBAR LAMINECTOMY/DECOMPRESSION MICRODISCECTOMY 1 LEVEL;  Surgeon: Venita Lick, MD;  Location: [facility] OR;  Service: Orthopedics;  Laterality: N/A;   L4-5 BILATERAL HEMILAMINECTOMY/MICRODISCECTOMY  . Back surgery      Family History  Problem Relation Age of Onset  . Diabetes Mother   . Diabetes Sister     History  Substance Use Topics  . Smoking status: Current Every Day Smoker -- 1.00 packs/day for 28 years    Types: Cigarettes  . Smokeless tobacco: Not on file  . Alcohol Use: No    No OB history provided.   Review of Systems  HENT: Positive for ear pain and dental problem.     Allergies  Review of patient's allergies indicates no known allergies.  Home Medications   Current Outpatient Rx  Name  Route  Sig  Dispense  Refill  . chlorhexidine (PERIDEX) 0.12 % solution   Mouth/Throat   Use as directed 15 mLs in the mouth or throat 2 (two) times daily as needed.         . cyclobenzaprine (FLEXERIL) 5 MG tablet   Oral   Take 5 mg by mouth daily as needed for muscle spasms. For muscle spasms         . ibuprofen (ADVIL,MOTRIN) 200 MG tablet   Oral   Take 400 mg by mouth every 6 (six) hours as needed for pain. For pain         .  sitaGLIPtan-metformin (JANUMET) 50-1000 MG per tablet   Oral   Take 1 tablet by mouth 2 (two) times daily with a meal.           BP 136/86  Pulse 72  Temp(Src) 98.2 F (36.8 C) (Oral)  SpO2 99%  LMP 11/26/2012  Physical Exam  Nursing note and vitals reviewed. Constitutional: She is oriented to person, place, and time. She appears well-developed and well-nourished. No distress.  HENT:  Head: Normocephalic and atraumatic.  Upper left rear molar with filling but no obvious carry.  Left TM normal.  Eyes: EOM are normal.  Neck: Neck supple. No tracheal deviation present.  No adenopathy  Cardiovascular: Normal rate.   Pulmonary/Chest: Effort normal. No respiratory distress.  Musculoskeletal: Normal range of motion.  Neurological: She is alert and oriented to person, place, and time.  Skin: Skin is warm and dry.  Psychiatric: She has a normal mood and affect. Her behavior is  normal.    ED Course  Procedures (including critical care time) DIAGNOSTIC STUDIES: Oxygen Saturation is 99% on room air, normal by my interpretation.    COORDINATION OF CARE: 6:55 PM- Patient informed of clinical course, understands medical decision-making process, and agrees with plan.   Labs Reviewed - No data to display No results found.   No diagnosis found.  1. Dental pain  MDM  Uncomplicated dental pain, appointment with dentist this week after recent dental appointments for treatment.  I personally performed the services described in this documentation, which was scribed in my presence. The recorded information has been reviewed and is accurate.        Arnoldo Hooker, PA-C 12/29/12 0040

## 2012-12-28 NOTE — ED Notes (Signed)
Pt states she has dental pain to L side of mouth. Pt has a dentist and states she is having a hard time making an appointment to have her tooth pulled. Pt also states she has pain to L side of face and L ear. Pt with no acute distress. States she has been taking Motrin for pain, but it is not helping. Pt has a ride home.

## 2012-12-31 NOTE — ED Provider Notes (Signed)
Medical screening examination/treatment/procedure(s) were performed by non-physician practitioner and as supervising physician I was immediately available for consultation/collaboration.  Okechukwu Regnier J. Jood Retana, MD 12/31/12 1535 

## 2013-02-02 ENCOUNTER — Emergency Department (HOSPITAL_COMMUNITY)
Admission: EM | Admit: 2013-02-02 | Discharge: 2013-02-02 | Disposition: A | Discharge status: Home / Self Care | Payer: Managed Care, Other (non HMO) | Attending: Emergency Medicine | Admitting: Emergency Medicine

## 2013-02-02 ENCOUNTER — Encounter (HOSPITAL_COMMUNITY): Payer: Self-pay | Admitting: *Deleted

## 2013-02-02 DIAGNOSIS — Z8639 Personal history of other endocrine, nutritional and metabolic disease: Secondary | ICD-10-CM | POA: Insufficient documentation

## 2013-02-02 DIAGNOSIS — Z862 Personal history of diseases of the blood and blood-forming organs and certain disorders involving the immune mechanism: Secondary | ICD-10-CM | POA: Insufficient documentation

## 2013-02-02 DIAGNOSIS — Z8619 Personal history of other infectious and parasitic diseases: Secondary | ICD-10-CM | POA: Insufficient documentation

## 2013-02-02 DIAGNOSIS — Z8709 Personal history of other diseases of the respiratory system: Secondary | ICD-10-CM | POA: Insufficient documentation

## 2013-02-02 DIAGNOSIS — Z8679 Personal history of other diseases of the circulatory system: Secondary | ICD-10-CM | POA: Insufficient documentation

## 2013-02-02 DIAGNOSIS — I10 Essential (primary) hypertension: Secondary | ICD-10-CM | POA: Insufficient documentation

## 2013-02-02 DIAGNOSIS — M25512 Pain in left shoulder: Secondary | ICD-10-CM

## 2013-02-02 DIAGNOSIS — E119 Type 2 diabetes mellitus without complications: Secondary | ICD-10-CM | POA: Insufficient documentation

## 2013-02-02 DIAGNOSIS — R000 Tachycardia, unspecified: Secondary | ICD-10-CM | POA: Insufficient documentation

## 2013-02-02 DIAGNOSIS — Z86718 Personal history of other venous thrombosis and embolism: Secondary | ICD-10-CM | POA: Insufficient documentation

## 2013-02-02 DIAGNOSIS — M549 Dorsalgia, unspecified: Secondary | ICD-10-CM | POA: Insufficient documentation

## 2013-02-02 DIAGNOSIS — Z8719 Personal history of other diseases of the digestive system: Secondary | ICD-10-CM | POA: Insufficient documentation

## 2013-02-02 DIAGNOSIS — Z853 Personal history of malignant neoplasm of breast: Secondary | ICD-10-CM | POA: Insufficient documentation

## 2013-02-02 DIAGNOSIS — F17200 Nicotine dependence, unspecified, uncomplicated: Secondary | ICD-10-CM | POA: Insufficient documentation

## 2013-02-02 DIAGNOSIS — M25519 Pain in unspecified shoulder: Secondary | ICD-10-CM | POA: Insufficient documentation

## 2013-02-02 DIAGNOSIS — Z87448 Personal history of other diseases of urinary system: Secondary | ICD-10-CM | POA: Insufficient documentation

## 2013-02-02 DIAGNOSIS — G8929 Other chronic pain: Secondary | ICD-10-CM | POA: Insufficient documentation

## 2013-02-02 DIAGNOSIS — Z79899 Other long term (current) drug therapy: Secondary | ICD-10-CM | POA: Insufficient documentation

## 2013-02-02 MED ORDER — TRAMADOL HCL 50 MG PO TABS: 50.0000 mg | ORAL_TABLET | Freq: Four times a day (QID) | ORAL | Status: DC | PRN

## 2013-02-02 NOTE — ED Notes (Signed)
Pt reports left shoulder pain x 2-3 weeks. Pain 6/10. Denies trauma or injury.

## 2013-02-02 NOTE — ED Notes (Signed)
Ortho Tech in to place sling, will monitor.

## 2013-02-02 NOTE — ED Provider Notes (Signed)
History     CSN: [id number]  Arrival date & time 02/02/13  1232   First MD Initiated Contact with Patient 02/02/13 1255      Chief Complaint  Patient presents with  . Shoulder Pain    (Consider location/radiation/quality/duration/timing/severity/associated sxs/prior treatment) HPI Comments: 47 year old female presents to the emergency department complaining of left shoulder pain x3 weeks. She cannot recall any injury or trauma. Describes the pain as dull rated 6/10, worse with movement and lifting things. Tried taking Flexeril and ibuprofen without relief. Denies numbness or tingling down her arm.  The history is provided by the patient.    Past Medical History  Diagnosis Date  . Hypertension   . Vitamin D deficiency   . Gallstones   . Breast cancer 2010    s/p mastectomy  . Sinus infection     pt is on Doxycycline  . History of blood clots     neck-after reconstructive surgery  . Chronic back pain     bulding/herniated disc  . Hemorrhoids   . Constipation     related to pain meds and takes stool softener prn  . Nocturia   . Diabetes mellitus     takes Saxagliptin-Metformin daily  . Lyme disease     taking Doxycycline-to finish up 06/03/12    Past Surgical History  Procedure Laterality Date  . Cesarean section  1992/1995  . Mastectomy  04/27/10    right  . Breast surgery    . Breast reconstruction  7/12  . Port a cath placed  2011  . Port a cath removed    . Lumbar laminectomy/decompression microdiscectomy  06/04/2012    Procedure: LUMBAR LAMINECTOMY/DECOMPRESSION MICRODISCECTOMY 1 LEVEL;  Surgeon: Venita Lick, MD;  Location: [facility] OR;  Service: Orthopedics;  Laterality: N/A;  L4-5 BILATERAL HEMILAMINECTOMY/MICRODISCECTOMY  . Back surgery      Family History  Problem Relation Age of Onset  . Diabetes Mother   . Diabetes Sister     History  Substance Use Topics  . Smoking status: Current Every Day Smoker -- 1.00 packs/day for 28 years    Types: Cigarettes    . Smokeless tobacco: Not on file  . Alcohol Use: No    OB History   Grav Para Term Preterm Abortions TAB SAB Ect Mult Living                  Review of Systems  Respiratory: Negative for shortness of breath.   Cardiovascular: Negative for chest pain.  Musculoskeletal:       Positive for left shoulder pain.  Neurological: Negative for numbness.  All other systems reviewed and are negative.    Allergies  Review of patient's allergies indicates no known allergies.  Home Medications   Current Outpatient Rx  Name  Route  Sig  Dispense  Refill  . cyclobenzaprine (FLEXERIL) 5 MG tablet   Oral   Take 5 mg by mouth daily as needed for muscle spasms. For muscle spasms         . ibuprofen (ADVIL,MOTRIN) 200 MG tablet   Oral   Take 400 mg by mouth every 6 (six) hours as needed for pain. For pain         . sitaGLIPtin (JANUVIA) 100 MG tablet   Oral   Take 100 mg by mouth daily.         . chlorhexidine (PERIDEX) 0.12 % solution   Mouth/Throat   Use as directed 15 mLs in the mouth or throat  2 (two) times daily as needed.         [name] oxyCODONE-acetaminophen (PERCOCET/ROXICET) 5-325 MG per tablet   Oral   Take 1-2 tablets by mouth every 4 (four) hours as needed for pain.   10 tablet   0   . penicillin v potassium (VEETID) 500 MG tablet   Oral   Take 1 tablet (500 mg total) by mouth 3 (three) times daily.   30 tablet   0   . traMADol (ULTRAM) 50 MG tablet   Oral   Take 1 tablet (50 mg total) by mouth every 6 (six) hours as needed for pain.   15 tablet   0     BP 131/80  Pulse 117  Temp(Src) 98 F (36.7 C) (Oral)  Resp 18  SpO2 98%  Physical Exam  Nursing note and vitals reviewed. Constitutional: She is oriented to person, place, and time. She appears well-developed and well-nourished. No distress.  HENT:  Head: Normocephalic and atraumatic.  Mouth/Throat: Oropharynx is clear and moist.  Eyes: Conjunctivae and EOM are normal. Pupils are equal, round,  and reactive to light.  Neck: Normal range of motion. Neck supple.  Cardiovascular: Regular rhythm, normal heart sounds and intact distal pulses.  Tachycardia present.  Exam reveals no gallop and no friction rub.   No murmur heard. Pulmonary/Chest: Effort normal and breath sounds normal.  Musculoskeletal:       Left shoulder: She exhibits decreased range of motion (decreased passive range of motion with internal and external rotation limited by pain, full active range of motion.), tenderness and crepitus. She exhibits no bony tenderness, no swelling, no deformity, normal pulse and normal strength.       Cervical back: Normal.       Arms: Neurological: She is alert and oriented to person, place, and time.  No sensory deficit. Strength upper extremity 5 out of 5 and equal bilateral.  Skin: Skin is warm and dry.  Psychiatric: She has a normal mood and affect. Her behavior is normal.    ED Course  Procedures (including critical care time)  Labs Reviewed - No data to display No results found.   1. Shoulder pain, left       MDM  Left shoulder pain- full passive range of motion, no deformity, neurovascularly intact. Crepitus noted. Positive impingement sign. Probable rotator cuff etiology. I will provider with a sling for comfort along with Ultram for pain as she receives many narcotic pain medications from the ED. She sees Dr. Shon Baton at [facility] orthoped[facility] for her back she will followup with regarding her shoulder. Patient states understanding of plan and is agreeable.  Trevor Mace, PA-C 02/02/13 1336

## 2013-02-02 NOTE — ED Notes (Signed)
PA at bedside.

## 2013-02-02 NOTE — ED Provider Notes (Signed)
Medical screening examination/treatment/procedure(s) were performed by non-physician practitioner and as supervising physician I was immediately available for consultation/collaboration.   Bently Wyss M Rumi Taras, MD 02/02/13 1550 

## 2013-02-12 ENCOUNTER — Encounter (HOSPITAL_COMMUNITY): Payer: Self-pay | Admitting: Emergency Medicine

## 2013-02-12 ENCOUNTER — Emergency Department (HOSPITAL_COMMUNITY)
Admission: EM | Admit: 2013-02-12 | Discharge: 2013-02-12 | Disposition: A | Discharge status: Home / Self Care | Payer: Managed Care, Other (non HMO) | Attending: Emergency Medicine | Admitting: Emergency Medicine

## 2013-02-12 DIAGNOSIS — Z8719 Personal history of other diseases of the digestive system: Secondary | ICD-10-CM | POA: Insufficient documentation

## 2013-02-12 DIAGNOSIS — Z79899 Other long term (current) drug therapy: Secondary | ICD-10-CM | POA: Insufficient documentation

## 2013-02-12 DIAGNOSIS — H00016 Hordeolum externum left eye, unspecified eyelid: Secondary | ICD-10-CM

## 2013-02-12 DIAGNOSIS — F17200 Nicotine dependence, unspecified, uncomplicated: Secondary | ICD-10-CM | POA: Insufficient documentation

## 2013-02-12 DIAGNOSIS — G8929 Other chronic pain: Secondary | ICD-10-CM | POA: Insufficient documentation

## 2013-02-12 DIAGNOSIS — Z862 Personal history of diseases of the blood and blood-forming organs and certain disorders involving the immune mechanism: Secondary | ICD-10-CM | POA: Insufficient documentation

## 2013-02-12 DIAGNOSIS — I10 Essential (primary) hypertension: Secondary | ICD-10-CM | POA: Insufficient documentation

## 2013-02-12 DIAGNOSIS — H00019 Hordeolum externum unspecified eye, unspecified eyelid: Secondary | ICD-10-CM | POA: Insufficient documentation

## 2013-02-12 DIAGNOSIS — E119 Type 2 diabetes mellitus without complications: Secondary | ICD-10-CM | POA: Insufficient documentation

## 2013-02-12 DIAGNOSIS — M549 Dorsalgia, unspecified: Secondary | ICD-10-CM | POA: Insufficient documentation

## 2013-02-12 DIAGNOSIS — Z853 Personal history of malignant neoplasm of breast: Secondary | ICD-10-CM | POA: Insufficient documentation

## 2013-02-12 DIAGNOSIS — Z8639 Personal history of other endocrine, nutritional and metabolic disease: Secondary | ICD-10-CM | POA: Insufficient documentation

## 2013-02-12 MED ORDER — HYDROCODONE-ACETAMINOPHEN 5-325 MG PO TABS
1.0000 | ORAL_TABLET | Freq: Once | ORAL | Status: AC
  Administered 2013-02-12: 1 via ORAL
  Filled 2013-02-12: qty 1

## 2013-02-12 MED ORDER — ERYTHROMYCIN 5 MG/GM OP OINT: TOPICAL_OINTMENT | OPHTHALMIC | Status: DC

## 2013-02-12 MED ORDER — HYDROCODONE-ACETAMINOPHEN 5-325 MG PO TABS: 1.0000 | ORAL_TABLET | Freq: Four times a day (QID) | ORAL | Status: DC | PRN

## 2013-02-12 MED ORDER — NAPROXEN 500 MG PO TABS: 500.0000 mg | ORAL_TABLET | Freq: Two times a day (BID) | ORAL | Status: DC

## 2013-02-12 NOTE — ED Notes (Signed)
Pt has a hx of stye to eye ans since yesterday has had lt eye pain and swelling. Redness noted.

## 2013-02-12 NOTE — ED Provider Notes (Signed)
History    CSN: [id number] Arrival date & time 02/12/13  2841 First MD Initiated Contact with Patient 02/12/13 0932     Chief Complaint  Patient presents with  . Eye Pain   HPI The patient presents to the emergency room with complaints of eye leg pain for the last 4 days. Patient has a history of styes. About 4 days ago she started noticing swelling in her left upper eyelid near the medial canthus. The swelling has persisted. This morning she was having more severe pain. She tried to go to an urgent care but the wait was too long so she came to the emergency department. Patient denies any fever or trouble with her vision. She has not had any drainage from her eye. Past Medical History  Diagnosis Date  . Hypertension   . Vitamin D deficiency   . Gallstones   . Breast cancer 2010    s/p mastectomy  . Sinus infection     pt is on Doxycycline  . History of blood clots     neck-after reconstructive surgery  . Chronic back pain     bulding/herniated disc  . Hemorrhoids   . Constipation     related to pain meds and takes stool softener prn  . Nocturia   . Diabetes mellitus     takes Saxagliptin-Metformin daily  . Lyme disease     taking Doxycycline-to finish up 06/03/12    Past Surgical History  Procedure Laterality Date  . Cesarean section  1992/1995  . Mastectomy  04/27/10    right  . Breast surgery    . Breast reconstruction  7/12  . Port a cath placed  2011  . Port a cath removed    . Lumbar laminectomy/decompression microdiscectomy  06/04/2012    Procedure: LUMBAR LAMINECTOMY/DECOMPRESSION MICRODISCECTOMY 1 LEVEL;  Surgeon: Venita Lick, MD;  Location: [facility] OR;  Service: Orthopedics;  Laterality: N/A;  L4-5 BILATERAL HEMILAMINECTOMY/MICRODISCECTOMY  . Back surgery      Family History  Problem Relation Age of Onset  . Diabetes Mother   . Diabetes Sister     History  Substance Use Topics  . Smoking status: Current Every Day Smoker -- 1.00 packs/day for 28 years   Types: Cigarettes  . Smokeless tobacco: Not on file  . Alcohol Use: No    OB History   Grav Para Term Preterm Abortions TAB SAB Ect Mult Living                  Review of Systems  All other systems reviewed and are negative.    Allergies  Review of patient's allergies indicates no known allergies.  Home Medications   Current Outpatient Rx  Name  Route  Sig  Dispense  Refill  . cyclobenzaprine (FLEXERIL) 5 MG tablet   Oral   Take 5 mg by mouth daily as needed for muscle spasms. For muscle spasms         . ibuprofen (ADVIL,MOTRIN) 600 MG tablet   Oral   Take 600 mg by mouth every 6 (six) hours as needed for pain.         . sitaGLIPtin (JANUVIA) 100 MG tablet   Oral   Take 100 mg by mouth daily.         . Tetrahydrozoline HCl (VISINE OP)   Ophthalmic   Apply 2 drops to eye every 6 (six) hours as needed (dry eyes (left eye only)).         . traMADol (  ULTRAM) 50 MG tablet   Oral   Take 1 tablet (50 mg total) by mouth every 6 (six) hours as needed for pain.   15 tablet   0   . erythromycin ophthalmic ointment      Place a 1/2 inch ribbon of ointment into the eyelid 4 times daily   1 g   0   . HYDROcodone-acetaminophen (NORCO) 5-325 MG per tablet   Oral   Take 1-2 tablets by mouth every 6 (six) hours as needed for pain.   12 tablet   0   . naproxen (NAPROSYN) 500 MG tablet   Oral   Take 1 tablet (500 mg total) by mouth 2 (two) times daily.   30 tablet   0   . penicillin v potassium (VEETID) 500 MG tablet   Oral   Take 1 tablet (500 mg total) by mouth 3 (three) times daily.   30 tablet   0     There were no vitals taken for this visit.  Physical Exam  Nursing note and vitals reviewed. Constitutional: She appears well-developed and well-nourished. No distress.  HENT:  Head: Normocephalic and atraumatic.  Right Ear: External ear normal.  Left Ear: External ear normal.  Eyes: Conjunctivae are normal. Right eye exhibits no discharge. Left  eye exhibits hordeolum. Left eye exhibits no discharge. No scleral icterus.  Focal area of swelling and erythema with mild induration left upper eyelid medially  Neck: Neck supple. No tracheal deviation present.  Cardiovascular: Normal rate.   Pulmonary/Chest: Effort normal. No stridor. No respiratory distress.  Musculoskeletal: She exhibits no edema.  Neurological: She is alert. Cranial nerve deficit: no gross deficits.  Skin: Skin is warm and dry. No rash noted.  Psychiatric: She has a normal mood and affect.    ED Course  Procedures (including critical care time)  Labs Reviewed - No data to display No results found.   1. Stye, left       MDM  Patient appears to have a sty in her left upper eyelid. I will prescribe her medications for pain and antibiotic eye ointment. I discussed warm compresses. She is to followup with an ophthalmologist if the symptoms do not improve.  At this time there does not appear to be any evidence of an acute emergency medical condition and the patient appears stable for discharge with appropriate outpatient follow up.         Celene Kras, MD 02/12/13 [phone]

## 2013-02-12 NOTE — ED Notes (Signed)
Pt has left eye pain and swelling.  States she has put warm compresses on it trying to relieve pain and swelling but not working.

## 2013-03-30 ENCOUNTER — Emergency Department (HOSPITAL_BASED_OUTPATIENT_CLINIC_OR_DEPARTMENT_OTHER)
Admission: EM | Admit: 2013-03-30 | Discharge: 2013-03-30 | Disposition: A | Discharge status: Home / Self Care | Payer: Managed Care, Other (non HMO) | Attending: Emergency Medicine | Admitting: Emergency Medicine

## 2013-03-30 ENCOUNTER — Emergency Department (HOSPITAL_BASED_OUTPATIENT_CLINIC_OR_DEPARTMENT_OTHER): Payer: Managed Care, Other (non HMO)

## 2013-03-30 ENCOUNTER — Encounter (HOSPITAL_BASED_OUTPATIENT_CLINIC_OR_DEPARTMENT_OTHER): Payer: Self-pay | Admitting: *Deleted

## 2013-03-30 DIAGNOSIS — Y9389 Activity, other specified: Secondary | ICD-10-CM | POA: Diagnosis not present

## 2013-03-30 DIAGNOSIS — Y92410 Unspecified street and highway as the place of occurrence of the external cause: Secondary | ICD-10-CM | POA: Diagnosis not present

## 2013-03-30 DIAGNOSIS — J329 Chronic sinusitis, unspecified: Secondary | ICD-10-CM | POA: Diagnosis not present

## 2013-03-30 DIAGNOSIS — Z8719 Personal history of other diseases of the digestive system: Secondary | ICD-10-CM | POA: Insufficient documentation

## 2013-03-30 DIAGNOSIS — Z86718 Personal history of other venous thrombosis and embolism: Secondary | ICD-10-CM | POA: Insufficient documentation

## 2013-03-30 DIAGNOSIS — S4980XA Other specified injuries of shoulder and upper arm, unspecified arm, initial encounter: Secondary | ICD-10-CM | POA: Diagnosis present

## 2013-03-30 DIAGNOSIS — E119 Type 2 diabetes mellitus without complications: Secondary | ICD-10-CM | POA: Insufficient documentation

## 2013-03-30 DIAGNOSIS — G8929 Other chronic pain: Secondary | ICD-10-CM | POA: Insufficient documentation

## 2013-03-30 DIAGNOSIS — IMO0002 Reserved for concepts with insufficient information to code with codable children: Secondary | ICD-10-CM | POA: Insufficient documentation

## 2013-03-30 DIAGNOSIS — S335XXA Sprain of ligaments of lumbar spine, initial encounter: Secondary | ICD-10-CM | POA: Diagnosis not present

## 2013-03-30 DIAGNOSIS — Z8739 Personal history of other diseases of the musculoskeletal system and connective tissue: Secondary | ICD-10-CM | POA: Insufficient documentation

## 2013-03-30 DIAGNOSIS — Z87891 Personal history of nicotine dependence: Secondary | ICD-10-CM | POA: Insufficient documentation

## 2013-03-30 DIAGNOSIS — Z8619 Personal history of other infectious and parasitic diseases: Secondary | ICD-10-CM | POA: Insufficient documentation

## 2013-03-30 DIAGNOSIS — K5900 Constipation, unspecified: Secondary | ICD-10-CM | POA: Diagnosis not present

## 2013-03-30 DIAGNOSIS — T148XXA Other injury of unspecified body region, initial encounter: Secondary | ICD-10-CM

## 2013-03-30 DIAGNOSIS — Z79899 Other long term (current) drug therapy: Secondary | ICD-10-CM | POA: Insufficient documentation

## 2013-03-30 DIAGNOSIS — Z853 Personal history of malignant neoplasm of breast: Secondary | ICD-10-CM | POA: Insufficient documentation

## 2013-03-30 DIAGNOSIS — Z8679 Personal history of other diseases of the circulatory system: Secondary | ICD-10-CM | POA: Insufficient documentation

## 2013-03-30 HISTORY — DX: Adhesive capsulitis of unspecified shoulder: M75.00

## 2013-03-30 MED ORDER — NAPROXEN 500 MG PO TABS: 500.0000 mg | ORAL_TABLET | Freq: Two times a day (BID) | ORAL | Status: DC

## 2013-03-30 MED ORDER — CYCLOBENZAPRINE HCL 5 MG PO TABS: 5.0000 mg | ORAL_TABLET | Freq: Every day | ORAL | Status: DC | PRN

## 2013-03-30 MED ORDER — HYDROCODONE-ACETAMINOPHEN 5-325 MG PO TABS
1.0000 | ORAL_TABLET | Freq: Once | ORAL | Status: AC
  Administered 2013-03-30: 1 via ORAL
  Filled 2013-03-30: qty 1
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
## 2013-03-30 NOTE — ED Notes (Signed)
Chart reviewed and care assumed. 
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
## 2013-04-01 ENCOUNTER — Other Ambulatory Visit: Payer: Self-pay | Admitting: Radiology

## 2013-04-01 NOTE — Telephone Encounter (Signed)
Needs f/u for Januvia

## 2013-04-02 ENCOUNTER — Telehealth: Payer: Self-pay

## 2013-04-02 NOTE — Telephone Encounter (Signed)
I faxed denial yesterday

## 2013-04-02 NOTE — Telephone Encounter (Signed)
[facility] requesting refill on Januvia 100 mg tablet.

## 2013-04-02 NOTE — Telephone Encounter (Signed)
Needs OV.  

## 2013-04-16 ENCOUNTER — Ambulatory Visit: Payer: Managed Care, Other (non HMO)

## 2013-04-16 ENCOUNTER — Ambulatory Visit (INDEPENDENT_AMBULATORY_CARE_PROVIDER_SITE_OTHER): Payer: Managed Care, Other (non HMO) | Admitting: Emergency Medicine

## 2013-04-16 VITALS — BP 136/72 | HR 76 | Temp 98.4°F | Resp 16 | Ht 62.0 in | Wt 148.0 lb

## 2013-04-16 DIAGNOSIS — M25519 Pain in unspecified shoulder: Secondary | ICD-10-CM

## 2013-04-16 DIAGNOSIS — M25512 Pain in left shoulder: Secondary | ICD-10-CM

## 2013-04-16 MED ORDER — OXYCODONE-ACETAMINOPHEN 5-325 MG PO TABS: 1.0000 | ORAL_TABLET | Freq: Three times a day (TID) | ORAL | Status: DC | PRN
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
## 2013-05-10 ENCOUNTER — Encounter: Payer: Self-pay | Admitting: Physical Medicine & Rehabilitation

## 2013-06-07 ENCOUNTER — Ambulatory Visit: Payer: Managed Care, Other (non HMO) | Admitting: Physical Medicine & Rehabilitation

## 2013-06-07 ENCOUNTER — Encounter: Payer: Managed Care, Other (non HMO) | Attending: Physical Medicine & Rehabilitation

## 2013-06-17 ENCOUNTER — Emergency Department (HOSPITAL_COMMUNITY)
Admission: EM | Admit: 2013-06-17 | Discharge: 2013-06-18 | Disposition: A | Discharge status: Home / Self Care | Payer: Managed Care, Other (non HMO) | Attending: Emergency Medicine | Admitting: Emergency Medicine

## 2013-06-17 ENCOUNTER — Encounter (HOSPITAL_COMMUNITY): Payer: Self-pay | Admitting: Emergency Medicine

## 2013-06-17 DIAGNOSIS — Z8639 Personal history of other endocrine, nutritional and metabolic disease: Secondary | ICD-10-CM | POA: Insufficient documentation

## 2013-06-17 DIAGNOSIS — Z8719 Personal history of other diseases of the digestive system: Secondary | ICD-10-CM | POA: Insufficient documentation

## 2013-06-17 DIAGNOSIS — E119 Type 2 diabetes mellitus without complications: Secondary | ICD-10-CM | POA: Insufficient documentation

## 2013-06-17 DIAGNOSIS — K649 Unspecified hemorrhoids: Secondary | ICD-10-CM

## 2013-06-17 DIAGNOSIS — G8929 Other chronic pain: Secondary | ICD-10-CM | POA: Insufficient documentation

## 2013-06-17 DIAGNOSIS — B379 Candidiasis, unspecified: Secondary | ICD-10-CM

## 2013-06-17 DIAGNOSIS — Z8619 Personal history of other infectious and parasitic diseases: Secondary | ICD-10-CM | POA: Insufficient documentation

## 2013-06-17 DIAGNOSIS — Z8709 Personal history of other diseases of the respiratory system: Secondary | ICD-10-CM | POA: Insufficient documentation

## 2013-06-17 DIAGNOSIS — Z8739 Personal history of other diseases of the musculoskeletal system and connective tissue: Secondary | ICD-10-CM | POA: Insufficient documentation

## 2013-06-17 DIAGNOSIS — M549 Dorsalgia, unspecified: Secondary | ICD-10-CM | POA: Insufficient documentation

## 2013-06-17 DIAGNOSIS — Z79899 Other long term (current) drug therapy: Secondary | ICD-10-CM | POA: Insufficient documentation

## 2013-06-17 DIAGNOSIS — Z87891 Personal history of nicotine dependence: Secondary | ICD-10-CM | POA: Insufficient documentation

## 2013-06-17 DIAGNOSIS — B3731 Acute candidiasis of vulva and vagina: Secondary | ICD-10-CM | POA: Insufficient documentation

## 2013-06-17 DIAGNOSIS — B373 Candidiasis of vulva and vagina: Secondary | ICD-10-CM | POA: Insufficient documentation

## 2013-06-17 DIAGNOSIS — K644 Residual hemorrhoidal skin tags: Secondary | ICD-10-CM | POA: Insufficient documentation

## 2013-06-17 DIAGNOSIS — Z853 Personal history of malignant neoplasm of breast: Secondary | ICD-10-CM | POA: Insufficient documentation

## 2013-06-17 NOTE — ED Notes (Signed)
Pt presents to the ED with a complaint of vaginitis.  Pt states she has had symptoms for a day.  Pt is also complaining of Hemorid and states she is unable to sit.  Pt used Monistat but without relief.  Pt had a sitz bath and perpetration H for her hemroids

## 2013-06-18 LAB — WET PREP, GENITAL: Clue Cells Wet Prep HPF POC: NONE SEEN

## 2013-06-18 MED ORDER — OXYCODONE-ACETAMINOPHEN 5-325 MG PO TABS
2.0000 | ORAL_TABLET | Freq: Once | ORAL | Status: AC
  Administered 2013-06-18: 2 via ORAL
  Filled 2013-06-18: qty 2

## 2013-06-18 MED ORDER — FLUCONAZOLE 100 MG PO TABS: 150.0000 mg | ORAL_TABLET | Freq: Every day | ORAL | Status: DC

## 2013-06-18 MED ORDER — HYDROCODONE-ACETAMINOPHEN 5-325 MG PO TABS: 2.0000 | ORAL_TABLET | Freq: Four times a day (QID) | ORAL | Status: DC | PRN

## 2013-06-18 NOTE — ED Provider Notes (Signed)
CSN: [id number]     Arrival date & time 06/17/13  2341 History   First MD Initiated Contact with Patient 06/18/13 0030     Chief Complaint  Patient presents with  . Vaginitis   (Consider location/radiation/quality/duration/timing/severity/associated sxs/prior Treatment) HPI Comments: Patient presents emergency department with chief complaint of yeast infection. She states that she recently took steroids for musculoskeletal shoulder injury. She states that she thinks that this gave her a yeast infection. She states that she is very itchy near her vagina. She also endorses some thick white discharge. She denies fevers, chills, nausea, or vomiting. She has tried using Monistat, but has not had any relief. Additionally, she states that she has had new onset hemorrhoids, which are very painful. She states that she has tried using sitz baths and Preparation H with no relief.  The history is provided by the patient. No language interpreter was used.    Past Medical History  Diagnosis Date  . Vitamin D deficiency   . Gallstones   . Breast cancer 2010    s/p mastectomy  . Sinus infection     pt is on Doxycycline  . History of blood clots     neck-after reconstructive surgery  . Chronic back pain     bulding/herniated disc  . Hemorrhoids   . Constipation     related to pain meds and takes stool softener prn  . Nocturia   . Diabetes mellitus     takes Saxagliptin-Metformin daily  . Lyme disease     taking Doxycycline-to finish up 06/03/12  . Frozen shoulder    Past Surgical History  Procedure Laterality Date  . Cesarean section  1992/1995  . Mastectomy  04/27/10    right  . Breast surgery    . Breast reconstruction  7/12  . Port a cath placed  2011  . Port a cath removed    . Lumbar laminectomy/decompression microdiscectomy  06/04/2012    Procedure: LUMBAR LAMINECTOMY/DECOMPRESSION MICRODISCECTOMY 1 LEVEL;  Surgeon: Venita Lick, MD;  Location: [facility] OR;  Service: Orthopedics;   Laterality: N/A;  L4-5 BILATERAL HEMILAMINECTOMY/MICRODISCECTOMY  . Back surgery    . Tubal ligation     Family History  Problem Relation Age of Onset  . Diabetes Mother   . Diabetes Sister    History  Substance Use Topics  . Smoking status: Former Smoker -- 1.00 packs/day for 28 years  . Smokeless tobacco: Not on file  . Alcohol Use: No   OB History   Grav Para Term Preterm Abortions TAB SAB Ect Mult Living                 Review of Systems  All other systems reviewed and are negative.    Allergies  Tramadol  Home Medications   Current Outpatient Rx  Name  Route  Sig  Dispense  Refill  . cyclobenzaprine (FLEXERIL) 5 MG tablet   Oral   Take 1 tablet (5 mg total) by mouth daily as needed for muscle spasms. For muscle spasms   30 tablet   0   . ibuprofen (ADVIL,MOTRIN) 600 MG tablet   Oral   Take 600 mg by mouth every 6 (six) hours as needed for pain.         . sitaGLIPtin (JANUVIA) 100 MG tablet   Oral   Take 100 mg by mouth daily.          BP 145/87  Pulse 100  Temp(Src) 98.5 F (36.9 C) (Oral)  Resp 20  SpO2 100%  LMP 05/25/2013 Physical Exam  Nursing note and vitals reviewed. Constitutional: She is oriented to person, place, and time. She appears well-developed and well-nourished.  HENT:  Head: Normocephalic and atraumatic.  Eyes: Conjunctivae and EOM are normal. Pupils are equal, round, and reactive to light.  Neck: Normal range of motion. Neck supple.  Cardiovascular: Normal rate and regular rhythm.  Exam reveals no gallop and no friction rub.   No murmur heard. Pulmonary/Chest: Effort normal and breath sounds normal. No respiratory distress. She has no wheezes. She has no rales. She exhibits no tenderness.  Abdominal: Soft. Bowel sounds are normal. She exhibits no distension and no mass. There is no tenderness. There is no rebound and no guarding. Hernia confirmed negative in the right inguinal area and confirmed negative in the left inguinal  area.  Genitourinary: Rectal exam shows external hemorrhoid. Rectal exam shows anal tone normal. No labial fusion. There is no rash, tenderness, lesion or injury on the right labia. There is no rash, tenderness, lesion or injury on the left labia. Uterus is not deviated, not enlarged, not fixed and not tender. Cervix exhibits no motion tenderness, no discharge and no friability. Right adnexum displays no mass, no tenderness and no fullness. Left adnexum displays no mass, no tenderness and no fullness. No erythema, tenderness or bleeding around the vagina. No foreign body around the vagina. No signs of injury around the vagina. Vaginal discharge found.  Chaperone present for exam  Rectal exam is remarkable for external hemorrhoids, soft brown stool  Musculoskeletal: Normal range of motion. She exhibits no edema and no tenderness.  Lymphadenopathy:       Right: No inguinal adenopathy present.       Left: No inguinal adenopathy present.  Neurological: She is alert and oriented to person, place, and time.  Skin: Skin is warm and dry.  Psychiatric: She has a normal mood and affect. Her behavior is normal. Judgment and thought content normal.    ED Course  Procedures (including critical care time) Labs Review Labs Reviewed  GC/CHLAMYDIA PROBE AMP  WET PREP, GENITAL   Results for orders placed during the hospital encounter of 06/17/13  WET PREP, GENITAL      Result Value Range   Yeast Wet Prep HPF POC NONE SEEN  NONE SEEN   Trich, Wet Prep NONE SEEN  NONE SEEN   Clue Cells Wet Prep HPF POC NONE SEEN  NONE SEEN   WBC, Wet Prep HPF POC RARE (*) NONE SEEN   No results found.    MDM   1. Yeast infection   2. Hemorrhoids    Patient with hemorrhoids and probable yeast infection. Will treat yeast infection with fluconazole. Treat hemorrhoids with MiraLax, sits baths, and suppositories.   Roxy Horseman, PA-C 06/18/13 [phone]

## 2013-06-19 LAB — GC/CHLAMYDIA PROBE AMP
CT Probe RNA: NEGATIVE
GC Probe RNA: NEGATIVE

## 2013-06-21 NOTE — ED Provider Notes (Signed)
Medical screening examination/treatment/procedure(s) were performed by non-physician practitioner and as supervising physician I was immediately available for consultation/collaboration.   Claudean Kinds, MD 06/21/13 [phone]

## 2013-07-01 ENCOUNTER — Emergency Department (HOSPITAL_COMMUNITY)
Admission: EM | Admit: 2013-07-01 | Discharge: 2013-07-01 | Disposition: A | Discharge status: Home / Self Care | Payer: Managed Care, Other (non HMO) | Attending: Emergency Medicine | Admitting: Emergency Medicine

## 2013-07-01 ENCOUNTER — Encounter (HOSPITAL_COMMUNITY): Payer: Self-pay | Admitting: *Deleted

## 2013-07-01 DIAGNOSIS — Z8719 Personal history of other diseases of the digestive system: Secondary | ICD-10-CM | POA: Insufficient documentation

## 2013-07-01 DIAGNOSIS — Z3202 Encounter for pregnancy test, result negative: Secondary | ICD-10-CM | POA: Insufficient documentation

## 2013-07-01 DIAGNOSIS — L293 Anogenital pruritus, unspecified: Secondary | ICD-10-CM | POA: Insufficient documentation

## 2013-07-01 DIAGNOSIS — N760 Acute vaginitis: Secondary | ICD-10-CM | POA: Insufficient documentation

## 2013-07-01 DIAGNOSIS — K644 Residual hemorrhoidal skin tags: Secondary | ICD-10-CM | POA: Insufficient documentation

## 2013-07-01 DIAGNOSIS — Z8739 Personal history of other diseases of the musculoskeletal system and connective tissue: Secondary | ICD-10-CM | POA: Insufficient documentation

## 2013-07-01 DIAGNOSIS — E119 Type 2 diabetes mellitus without complications: Secondary | ICD-10-CM | POA: Insufficient documentation

## 2013-07-01 DIAGNOSIS — Z853 Personal history of malignant neoplasm of breast: Secondary | ICD-10-CM | POA: Insufficient documentation

## 2013-07-01 DIAGNOSIS — Z79899 Other long term (current) drug therapy: Secondary | ICD-10-CM | POA: Insufficient documentation

## 2013-07-01 DIAGNOSIS — A499 Bacterial infection, unspecified: Secondary | ICD-10-CM | POA: Insufficient documentation

## 2013-07-01 DIAGNOSIS — Z862 Personal history of diseases of the blood and blood-forming organs and certain disorders involving the immune mechanism: Secondary | ICD-10-CM | POA: Insufficient documentation

## 2013-07-01 DIAGNOSIS — Z86718 Personal history of other venous thrombosis and embolism: Secondary | ICD-10-CM | POA: Insufficient documentation

## 2013-07-01 DIAGNOSIS — Z87891 Personal history of nicotine dependence: Secondary | ICD-10-CM | POA: Insufficient documentation

## 2013-07-01 DIAGNOSIS — G8929 Other chronic pain: Secondary | ICD-10-CM | POA: Insufficient documentation

## 2013-07-01 DIAGNOSIS — Z8619 Personal history of other infectious and parasitic diseases: Secondary | ICD-10-CM | POA: Insufficient documentation

## 2013-07-01 DIAGNOSIS — B9689 Other specified bacterial agents as the cause of diseases classified elsewhere: Secondary | ICD-10-CM | POA: Insufficient documentation

## 2013-07-01 DIAGNOSIS — Z8639 Personal history of other endocrine, nutritional and metabolic disease: Secondary | ICD-10-CM | POA: Insufficient documentation

## 2013-07-01 LAB — WET PREP, GENITAL
Trich, Wet Prep: NONE SEEN
Yeast Wet Prep HPF POC: NONE SEEN

## 2013-07-01 LAB — URINE MICROSCOPIC-ADD ON

## 2013-07-01 LAB — GLUCOSE, CAPILLARY: Glucose-Capillary: 281 mg/dL — ABNORMAL HIGH (ref 70–99)

## 2013-07-01 LAB — URINALYSIS, ROUTINE W REFLEX MICROSCOPIC
Hgb urine dipstick: NEGATIVE
Nitrite: NEGATIVE
Specific Gravity, Urine: 1.027 (ref 1.005–1.030)
Urobilinogen, UA: 1 mg/dL (ref 0.0–1.0)
pH: 7 (ref 5.0–8.0)

## 2013-07-01 LAB — POCT PREGNANCY, URINE: Preg Test, Ur: NEGATIVE

## 2013-07-01 MED ORDER — METRONIDAZOLE 500 MG PO TABS: 500.0000 mg | ORAL_TABLET | Freq: Two times a day (BID) | ORAL | Status: DC

## 2013-07-01 MED ORDER — HYDROCODONE-ACETAMINOPHEN 5-325 MG PO TABS
2.0000 | ORAL_TABLET | Freq: Once | ORAL | Status: AC
  Administered 2013-07-01: 2 via ORAL
  Filled 2013-07-01: qty 2

## 2013-07-01 MED ORDER — HYDROCODONE-ACETAMINOPHEN 5-325 MG PO TABS: 1.0000 | ORAL_TABLET | ORAL | Status: DC | PRN

## 2013-07-01 NOTE — ED Provider Notes (Signed)
TIME SEEN: 4:18 PM  CHIEF COMPLAINT: Vaginal itching, hemorrhoids  HPI: Patient is a 47 y.o. female with a history of non-insulin-dependent diabetes who presents emergency department with rectal pain and prior blood per rectum for 2 weeks. Patient reports that she has had external hemorrhoids for many years with intermittent flareups. She has followup to see a surgeon in the next 2 weeks but states that her pain is gone increasingly worse and is unbearable. It is described as a sharp, severe pain that is worse with sitting and better with standing. She also reports she's had 3 days of vaginal itching. She was seen in the emergency department 2 weeks ago and diagnosed with yeast infection and treated. She states that her symptoms resolved but are now back. She denies any vaginal bleeding or discharge. Last menstrual period was September 8. She reports her blood glucoses been poorly controlled. She states the highest it has been is 240. She states this is because she has had multiple shoulder intra-articular steroid injections, last was 2 weeks ago. She denies any fever, vomiting, diarrhea, melena, abdominal pain.  ROS: See HPI Constitutional: no fever  Eyes: no drainage  ENT: no runny nose   Cardiovascular:  no chest pain  Resp: no SOB  GI: no vomiting GU: no dysuria Integumentary: no rash  Allergy: no hives  Musculoskeletal: no leg swelling  Neurological: no slurred speech ROS otherwise negative  PAST MEDICAL HISTORY/PAST SURGICAL HISTORY:  Past Medical History  Diagnosis Date  . Vitamin D deficiency   . Gallstones   . Breast cancer 2010    s/p mastectomy  . Sinus infection     pt is on Doxycycline  . History of blood clots     neck-after reconstructive surgery  . Chronic back pain     bulding/herniated disc  . Hemorrhoids   . Constipation     related to pain meds and takes stool softener prn  . Nocturia   . Diabetes mellitus     takes Saxagliptin-Metformin daily  . Lyme  disease     taking Doxycycline-to finish up 06/03/12  . Frozen shoulder     MEDICATIONS:  Prior to Admission medications   Medication Sig Start Date End Date Taking? Authorizing Provider  cyclobenzaprine (FLEXERIL) 5 MG tablet Take 1 tablet (5 mg total) by mouth daily as needed for muscle spasms. For muscle spasms 03/30/13  Yes Celene Kras, MD  ibuprofen (ADVIL,MOTRIN) 600 MG tablet Take 600 mg by mouth every 6 (six) hours as needed for pain.   Yes Historical Provider, MD  sitaGLIPtin (JANUVIA) 100 MG tablet Take 100 mg by mouth daily.   Yes Historical Provider, MD  fluconazole (DIFLUCAN) 100 MG tablet Take 1.5 tablets (150 mg total) by mouth daily. Take 1.5 tablets tomorrow, and then 1.5 tablets 72 hours later 06/18/13   Roxy Horseman, PA-C  HYDROcodone-acetaminophen (NORCO/VICODIN) 5-325 MG per tablet Take 2 tablets by mouth every 6 (six) hours as needed for pain. 06/18/13   Roxy Horseman, PA-C    ALLERGIES:  Allergies  Allergen Reactions  . Tramadol Other (See Comments)    Trouble sleeping and hyper    SOCIAL HISTORY:  History  Substance Use Topics  . Smoking status: Former Smoker -- 1.00 packs/day for 28 years  . Smokeless tobacco: Not on file  . Alcohol Use: No    FAMILY HISTORY: Family History  Problem Relation Age of Onset  . Diabetes Mother   . Diabetes Sister     EXAM:  BP 139/100  Pulse 122  Temp(Src) 98.2 F (36.8 C) (Oral)  Resp 18  SpO2 92%  LMP 06/21/2013 CONSTITUTIONAL: Alert and oriented and responds appropriately to questions. Well-appearing; well-nourished HEAD: Normocephalic EYES: Conjunctivae clear, PERRL ENT: normal nose; no rhinorrhea; moist mucous membranes; pharynx without lesions noted NECK: Supple, no meningismus, no LAD  CARD: RRR; S1 and S2 appreciated; no murmurs, no clicks, no rubs, no gallops RESP: Normal chest excursion without splinting or tachypnea; breath sounds clear and equal bilaterally; no wheezes, no rhonchi, no rales,  ABD/GI:  Normal bowel sounds; non-distended; soft, non-tender, no rebound, no guarding GU:  Patient does have mild swelling of her labia majora bilaterally; no external genital lesions, minimal amount of thin white discharge on pelvic exam, no adnexal tenderness or fullness, no cervical motion tenderness, no vaginal bleeding RECTAL:  Patient has 2 external hemorrhoids that are nonthrombosed and nonbleeding BACK:  The back appears normal and is non-tender to palpation, there is no CVA tenderness EXT: Normal ROM in all joints; non-tender to palpation; no edema; normal capillary refill; no cyanosis    SKIN: Normal color for age and race; warm NEURO: Moves all extremities equally PSYCH: The patient's mood and manner are appropriate. Grooming and personal hygiene are appropriate.  MEDICAL DECISION MAKING: Patient with vaginal itching and nonthrombosed external hemorrhoids. We'll perform pelvic exam with wet prep. She is recently had a pelvic exam with a negative gonorrhea and Chlamydia. Patient has outpatient surgery followup for her hemorrhoids. Given information for supportive care. We'll give oral pain medication and reassess.  ED PROGRESS: Patient's wet prep shows clue cells. Will treat with Flagyl for bacterial vaginosis. Given return precautions. Patient has outpatient surgery followup. Patient verbalizes understanding is comfortable plan.  Patient's heart rate was in the 120s which I feel is secondary to pain. She denies any chest pain or shortness of breath. No recent vomiting or diarrhea. She only reports a minimal amount of bright red blood on the toilet paper with her bowel movements. No active rectal bleeding. I do not feel patient is anemic. She does not have pale conjunctiva. On my reevaluation, patient's heart rate was 73 with standing. We'll discharge home.  Layla Maw Levon Penning, DO 07/01/13 [phone]

## 2013-07-01 NOTE — ED Notes (Signed)
Multiple complaints. Having upper airway congestion, thinks she has sinus infection. Has painful hemorrhoids which she thinks has led to vaginal itching. No acute distress noted at triage.

## 2013-07-05 DIAGNOSIS — Z0271 Encounter for disability determination: Secondary | ICD-10-CM

## 2013-07-20 ENCOUNTER — Emergency Department (HOSPITAL_COMMUNITY)
Admission: EM | Admit: 2013-07-20 | Discharge: 2013-07-20 | Disposition: A | Discharge status: Home / Self Care | Payer: Managed Care, Other (non HMO) | Attending: Emergency Medicine | Admitting: Emergency Medicine

## 2013-07-20 ENCOUNTER — Encounter (HOSPITAL_COMMUNITY): Payer: Self-pay | Admitting: Emergency Medicine

## 2013-07-20 DIAGNOSIS — Z79899 Other long term (current) drug therapy: Secondary | ICD-10-CM | POA: Insufficient documentation

## 2013-07-20 DIAGNOSIS — N898 Other specified noninflammatory disorders of vagina: Secondary | ICD-10-CM

## 2013-07-20 DIAGNOSIS — E119 Type 2 diabetes mellitus without complications: Secondary | ICD-10-CM | POA: Insufficient documentation

## 2013-07-20 DIAGNOSIS — Z3202 Encounter for pregnancy test, result negative: Secondary | ICD-10-CM | POA: Insufficient documentation

## 2013-07-20 DIAGNOSIS — K649 Unspecified hemorrhoids: Secondary | ICD-10-CM | POA: Insufficient documentation

## 2013-07-20 DIAGNOSIS — Z87891 Personal history of nicotine dependence: Secondary | ICD-10-CM | POA: Insufficient documentation

## 2013-07-20 DIAGNOSIS — N390 Urinary tract infection, site not specified: Secondary | ICD-10-CM | POA: Insufficient documentation

## 2013-07-20 DIAGNOSIS — R300 Dysuria: Secondary | ICD-10-CM | POA: Insufficient documentation

## 2013-07-20 LAB — WET PREP, GENITAL: Yeast Wet Prep HPF POC: NONE SEEN

## 2013-07-20 LAB — URINALYSIS, ROUTINE W REFLEX MICROSCOPIC
Protein, ur: NEGATIVE mg/dL
Specific Gravity, Urine: 1.043 — ABNORMAL HIGH (ref 1.005–1.030)
Urobilinogen, UA: 0.2 mg/dL (ref 0.0–1.0)

## 2013-07-20 LAB — URINE MICROSCOPIC-ADD ON

## 2013-07-20 MED ORDER — CEPHALEXIN 250 MG PO CAPS
500.0000 mg | ORAL_CAPSULE | Freq: Once | ORAL | Status: AC
  Administered 2013-07-20: 500 mg via ORAL
  Filled 2013-07-20: qty 2

## 2013-07-20 MED ORDER — LIDOCAINE 5 % EX OINT: TOPICAL_OINTMENT | CUTANEOUS | Status: DC | PRN

## 2013-07-20 MED ORDER — CEPHALEXIN 500 MG PO CAPS: 500.0000 mg | ORAL_CAPSULE | Freq: Four times a day (QID) | ORAL | Status: DC

## 2013-07-20 MED ORDER — LIDOCAINE HCL 2 % EX GEL
Freq: Once | CUTANEOUS | Status: AC
  Administered 2013-07-20: 20 via TOPICAL
  Filled 2013-07-20: qty 20

## 2013-07-20 NOTE — ED Provider Notes (Signed)
CSN: [id number]     Arrival date & time 07/20/13  7829 History   First MD Initiated Contact with Patient 07/20/13 0406     Chief Complaint  Patient presents with  . Vaginal Itching    The history is provided by the patient.   patient reports developing a vaginal irritation and labial irritation of the past several days.  She had been dealing with hemorrhoids and she's concerned that some hemorrhoid cream irritated her.  She does report some mild dysuria.  She denies fevers and chills.  No nausea or vomiting.  No lower abdominal pain.  No vaginal complaints.  She was seen recently for similar symptoms and was placed on Flagyl and she states that seemed to help her symptoms transiently but now her symptoms have returned.  She has not seen a gynecologist.    Past Medical History  Diagnosis Date  . Vitamin D deficiency   . Gallstones   . Breast cancer 2010    s/p mastectomy  . Sinus infection     pt is on Doxycycline  . History of blood clots     neck-after reconstructive surgery  . Chronic back pain     bulding/herniated disc  . Hemorrhoids   . Constipation     related to pain meds and takes stool softener prn  . Nocturia   . Diabetes mellitus     takes Saxagliptin-Metformin daily  . Lyme disease     taking Doxycycline-to finish up 06/03/12  . Frozen shoulder    Past Surgical History  Procedure Laterality Date  . Cesarean section  1992/1995  . Mastectomy  04/27/10    right  . Breast surgery    . Breast reconstruction  7/12  . Port a cath placed  2011  . Port a cath removed    . Lumbar laminectomy/decompression microdiscectomy  06/04/2012    Procedure: LUMBAR LAMINECTOMY/DECOMPRESSION MICRODISCECTOMY 1 LEVEL;  Surgeon: Venita Lick, MD;  Location: [facility] OR;  Service: Orthopedics;  Laterality: N/A;  L4-5 BILATERAL HEMILAMINECTOMY/MICRODISCECTOMY  . Back surgery    . Tubal ligation     Family History  Problem Relation Age of Onset  . Diabetes Mother   . Diabetes Sister     History  Substance Use Topics  . Smoking status: Former Smoker -- 1.00 packs/day for 28 years  . Smokeless tobacco: Not on file  . Alcohol Use: No   OB History   Grav Para Term Preterm Abortions TAB SAB Ect Mult Living                 Review of Systems  All other systems reviewed and are negative.    Allergies  Tramadol  Home Medications   Current Outpatient Rx  Name  Route  Sig  Dispense  Refill  . cyclobenzaprine (FLEXERIL) 5 MG tablet   Oral   Take 1 tablet (5 mg total) by mouth daily as needed for muscle spasms. For muscle spasms   30 tablet   0   . HYDROcodone-acetaminophen (NORCO/VICODIN) 5-325 MG per tablet   Oral   Take 1 tablet by mouth every 4 (four) hours as needed for pain.   15 tablet   0   . hydrocortisone-pramoxine (PROCTOFOAM-HC) rectal foam   Rectal   Place 1 applicator rectally 2 (two) times daily.         . Miconazole Nitrate (MONISTAT 1 DAY OR NIGHT VA)   Vaginal   Place vaginally once.         [name]  sitaGLIPtin (JANUVIA) 100 MG tablet   Oral   Take 100 mg by mouth daily.         . cephALEXin (KEFLEX) 500 MG capsule   Oral   Take 1 capsule (500 mg total) by mouth 4 (four) times daily.   20 capsule   0   . fluconazole (DIFLUCAN) 100 MG tablet   Oral   Take 1.5 tablets (150 mg total) by mouth daily. Take 1.5 tablets tomorrow, and then 1.5 tablets 72 hours later   2 tablet   0   . lidocaine (XYLOCAINE) 5 % ointment   Topical   Apply topically as needed.   35.44 g   0   . metroNIDAZOLE (FLAGYL) 500 MG tablet   Oral   Take 1 tablet (500 mg total) by mouth 2 (two) times daily.   14 tablet   0    BP 129/74  Pulse 96  Temp(Src) 97.9 F (36.6 C) (Oral)  Resp 14  SpO2 96%  LMP 06/21/2013 Physical Exam  Nursing note and vitals reviewed. Constitutional: She is oriented to person, place, and time. She appears well-developed and well-nourished. No distress.  HENT:  Head: Normocephalic and atraumatic.  Eyes: EOM are  normal.  Neck: Normal range of motion.  Cardiovascular: Normal rate, regular rhythm and normal heart sounds.   Pulmonary/Chest: Effort normal and breath sounds normal.  Abdominal: Soft. She exhibits no distension. There is no tenderness.  Genitourinary:  Normal cervix.  No cervical discharge.  No cervical motion tenderness.  No adnexal masses or fullness.  Mild irritation of her labia bilaterally.  No excoriations.  No pustular lesions.  No erythema or warmth.  Musculoskeletal: Normal range of motion.  Neurological: She is alert and oriented to person, place, and time.  Skin: Skin is warm and dry.  Psychiatric: She has a normal mood and affect. Judgment normal.    ED Course  Procedures (including critical care time) Labs Review Labs Reviewed  WET PREP, GENITAL - Abnormal; Notable for the following:    WBC, Wet Prep HPF POC FEW (*)    All other components within normal limits  URINALYSIS, ROUTINE W REFLEX MICROSCOPIC - Abnormal; Notable for the following:    APPearance CLOUDY (*)    Specific Gravity, Urine 1.043 (*)    Glucose, UA >1000 (*)    Leukocytes, UA MODERATE (*)    All other components within normal limits  URINE MICROSCOPIC-ADD ON - Abnormal; Notable for the following:    Bacteria, UA FEW (*)    All other components within normal limits  GC/CHLAMYDIA PROBE AMP  URINE CULTURE  PREGNANCY, URINE   Imaging Review No results found.  MDM   1. Vaginal irritation   2. Urinary tract infection    Patient with evidence of urinary tract infection based on her white blood cells in her urine.  The majority of his symptoms seem more like labial irritation.  The patient be prescribed lidocaine ointment for pain control.  GYN have followup.      Lyanne Co, MD 07/20/13 [phone]

## 2013-07-20 NOTE — ED Notes (Signed)
Pt states she has been having peritneal burning even after taking medication prescribed for her vaginal infection.  Pt states she took some OTC monistat and it only made the burning worse

## 2013-07-20 NOTE — ED Notes (Signed)
Pt. reports vaginal burning /itching for several weeks pt. stated history of yeast infection unrelieved by OTC Monistat .

## 2013-07-21 LAB — URINE CULTURE: Colony Count: NO GROWTH

## 2013-08-12 ENCOUNTER — Emergency Department (HOSPITAL_COMMUNITY): Payer: Managed Care, Other (non HMO)

## 2013-08-12 ENCOUNTER — Encounter (HOSPITAL_COMMUNITY): Payer: Self-pay | Admitting: Emergency Medicine

## 2013-08-12 ENCOUNTER — Emergency Department (HOSPITAL_COMMUNITY)
Admission: EM | Admit: 2013-08-12 | Discharge: 2013-08-12 | Disposition: A | Discharge status: Home / Self Care | Payer: Managed Care, Other (non HMO) | Attending: Emergency Medicine | Admitting: Emergency Medicine

## 2013-08-12 DIAGNOSIS — Z853 Personal history of malignant neoplasm of breast: Secondary | ICD-10-CM | POA: Insufficient documentation

## 2013-08-12 DIAGNOSIS — Z862 Personal history of diseases of the blood and blood-forming organs and certain disorders involving the immune mechanism: Secondary | ICD-10-CM | POA: Insufficient documentation

## 2013-08-12 DIAGNOSIS — Z79899 Other long term (current) drug therapy: Secondary | ICD-10-CM | POA: Insufficient documentation

## 2013-08-12 DIAGNOSIS — M79609 Pain in unspecified limb: Secondary | ICD-10-CM

## 2013-08-12 DIAGNOSIS — G8929 Other chronic pain: Secondary | ICD-10-CM | POA: Insufficient documentation

## 2013-08-12 DIAGNOSIS — Z8719 Personal history of other diseases of the digestive system: Secondary | ICD-10-CM | POA: Insufficient documentation

## 2013-08-12 DIAGNOSIS — Z8709 Personal history of other diseases of the respiratory system: Secondary | ICD-10-CM | POA: Insufficient documentation

## 2013-08-12 DIAGNOSIS — Z8639 Personal history of other endocrine, nutritional and metabolic disease: Secondary | ICD-10-CM | POA: Insufficient documentation

## 2013-08-12 DIAGNOSIS — Z8619 Personal history of other infectious and parasitic diseases: Secondary | ICD-10-CM | POA: Insufficient documentation

## 2013-08-12 DIAGNOSIS — I82B19 Acute embolism and thrombosis of unspecified subclavian vein: Secondary | ICD-10-CM | POA: Insufficient documentation

## 2013-08-12 DIAGNOSIS — Z87891 Personal history of nicotine dependence: Secondary | ICD-10-CM | POA: Insufficient documentation

## 2013-08-12 DIAGNOSIS — E119 Type 2 diabetes mellitus without complications: Secondary | ICD-10-CM | POA: Insufficient documentation

## 2013-08-12 DIAGNOSIS — Z8739 Personal history of other diseases of the musculoskeletal system and connective tissue: Secondary | ICD-10-CM | POA: Insufficient documentation

## 2013-08-12 DIAGNOSIS — I82B11 Acute embolism and thrombosis of right subclavian vein: Secondary | ICD-10-CM

## 2013-08-12 LAB — CBC WITH DIFFERENTIAL/PLATELET
Eosinophils Absolute: 0.3 10*3/uL (ref 0.0–0.7)
HCT: 44.2 % (ref 36.0–46.0)
Hemoglobin: 16 g/dL — ABNORMAL HIGH (ref 12.0–15.0)
Lymphocytes Relative: 25 % (ref 12–46)
Lymphs Abs: 2.9 10*3/uL (ref 0.7–4.0)
Monocytes Absolute: 0.8 10*3/uL (ref 0.1–1.0)
Monocytes Relative: 7 % (ref 3–12)
Neutro Abs: 7.3 10*3/uL (ref 1.7–7.7)
Neutrophils Relative %: 64 % (ref 43–77)
RBC: 4.9 MIL/uL (ref 3.87–5.11)
WBC: 11.4 10*3/uL — ABNORMAL HIGH (ref 4.0–10.5)

## 2013-08-12 LAB — PROTIME-INR
INR: 1.04 (ref 0.00–1.49)
Prothrombin Time: 13.4 seconds (ref 11.6–15.2)

## 2013-08-12 LAB — POCT I-STAT, CHEM 8
BUN: 8 mg/dL (ref 6–23)
Calcium, Ion: 1.14 mmol/L (ref 1.12–1.23)
Creatinine, Ser: 0.5 mg/dL (ref 0.50–1.10)
HCT: 49 % — ABNORMAL HIGH (ref 36.0–46.0)
Potassium: 3.7 mEq/L (ref 3.5–5.1)
Sodium: 139 mEq/L (ref 135–145)
TCO2: 23 mmol/L (ref 0–100)

## 2013-08-12 MED ORDER — HYDROMORPHONE HCL PF 1 MG/ML IJ SOLN
1.0000 mg | Freq: Once | INTRAMUSCULAR | Status: AC
  Administered 2013-08-12: 1 mg via INTRAVENOUS
  Filled 2013-08-12: qty 1

## 2013-08-12 MED ORDER — RIVAROXABAN 15 MG PO TABS
15.0000 mg | ORAL_TABLET | Freq: Two times a day (BID) | ORAL | Status: DC
  Administered 2013-08-12: 15 mg via ORAL
  Filled 2013-08-12 (×2): qty 1

## 2013-08-12 MED ORDER — SODIUM CHLORIDE 0.9 % IV SOLN
Freq: Once | INTRAVENOUS | Status: AC
  Administered 2013-08-12: 03:00:00 via INTRAVENOUS

## 2013-08-12 MED ORDER — ENOXAPARIN SODIUM 100 MG/ML ~~LOC~~ SOLN
65.0000 mg | SUBCUTANEOUS | Status: AC
  Administered 2013-08-12: 65 mg via SUBCUTANEOUS
  Filled 2013-08-12: qty 1

## 2013-08-12 MED ORDER — ONDANSETRON 4 MG PO TBDP
8.0000 mg | ORAL_TABLET | Freq: Once | ORAL | Status: AC
  Administered 2013-08-12: 8 mg via ORAL
  Filled 2013-08-12: qty 2

## 2013-08-12 MED ORDER — OXYCODONE-ACETAMINOPHEN 5-325 MG PO TABS: 1.0000 | ORAL_TABLET | ORAL | Status: DC | PRN

## 2013-08-12 MED ORDER — RIVAROXABAN 15 MG PO TABS: 15.0000 mg | ORAL_TABLET | Freq: Two times a day (BID) | ORAL | Status: DC

## 2013-08-12 MED ORDER — IOHEXOL 300 MG/ML  SOLN
80.0000 mL | Freq: Once | INTRAMUSCULAR | Status: AC | PRN
  Administered 2013-08-12: 80 mL via INTRAVENOUS

## 2013-08-12 MED ORDER — ONDANSETRON HCL 4 MG/2ML IJ SOLN
4.0000 mg | Freq: Once | INTRAMUSCULAR | Status: AC
  Administered 2013-08-12: 4 mg via INTRAVENOUS
  Filled 2013-08-12: qty 2

## 2013-08-12 MED ORDER — RIVAROXABAN 20 MG PO TABS: 20.0000 mg | ORAL_TABLET | Freq: Every day | ORAL | Status: DC

## 2013-08-12 NOTE — ED Notes (Signed)
The pt is c/o some rt neck pain since she had surgery 1 1/2 weeks ago.

## 2013-08-12 NOTE — ED Notes (Signed)
Patient transported to CT 

## 2013-08-12 NOTE — ED Provider Notes (Signed)
CSN: [id number]     Arrival date & time 08/12/13  0052 History   First MD Initiated Contact with Patient 08/12/13 0118     Chief Complaint  Patient presents with  . Neck Pain   (Consider location/radiation/quality/duration/timing/severity/associated sxs/prior Treatment) HPI Comments: Patient had a shoulder surgery as an outpatient one week ago.  3, days post op, she developed right-sided neck pain.  That has been persistent and worsening since then, despite the use of Vicodin, and Flexeril.  Vision has a history of blood clot in the same area 4 years ago, after mastectomy  Patient is a 47 y.o. female presenting with neck pain. The history is provided by the patient.  Neck Pain Pain location:  R side Quality:  Aching Pain radiates to:  Does not radiate Pain severity:  Moderate Pain is:  Same all the time Onset quality:  Gradual Duration:  3 days Timing:  Constant Progression:  Worsening Chronicity:  New Context comment:  Recent surgery Relieved by:  Nothing Worsened by:  Position Ineffective treatments:  Analgesics and muscle relaxants Associated symptoms: no fever, no headaches, no numbness and no paresis   Risk factors comment:  Recent surgery, history of blood clot in the same area   Past Medical History  Diagnosis Date  . Vitamin D deficiency   . Gallstones   . Breast cancer 2010    s/p mastectomy  . Sinus infection     pt is on Doxycycline  . History of blood clots     neck-after reconstructive surgery  . Chronic back pain     bulding/herniated disc  . Hemorrhoids   . Constipation     related to pain meds and takes stool softener prn  . Nocturia   . Diabetes mellitus     takes Saxagliptin-Metformin daily  . Lyme disease     taking Doxycycline-to finish up 06/03/12  . Frozen shoulder    Past Surgical History  Procedure Laterality Date  . Cesarean section  1992/1995  . Mastectomy  04/27/10    right  . Breast surgery    . Breast reconstruction  7/12  . Port  a cath placed  2011  . Port a cath removed    . Lumbar laminectomy/decompression microdiscectomy  06/04/2012    Procedure: LUMBAR LAMINECTOMY/DECOMPRESSION MICRODISCECTOMY 1 LEVEL;  Surgeon: Venita Lick, MD;  Location: [facility] OR;  Service: Orthopedics;  Laterality: N/A;  L4-5 BILATERAL HEMILAMINECTOMY/MICRODISCECTOMY  . Back surgery    . Tubal ligation     Family History  Problem Relation Age of Onset  . Diabetes Mother   . Diabetes Sister    History  Substance Use Topics  . Smoking status: Former Smoker -- 1.00 packs/day for 28 years  . Smokeless tobacco: Not on file  . Alcohol Use: No   OB History   Grav Para Term Preterm Abortions TAB SAB Ect Mult Living                 Review of Systems  Unable to perform ROS Constitutional: Negative for fever and chills.  Respiratory: Negative for shortness of breath.   Cardiovascular: Negative for leg swelling.  Musculoskeletal: Positive for neck pain. Negative for joint swelling.  Skin: Negative for rash and wound.  Neurological: Negative for dizziness, numbness and headaches.  All other systems reviewed and are negative.    Allergies  Tramadol  Home Medications   Current Outpatient Rx  Name  Route  Sig  Dispense  Refill  . HYDROcodone-acetaminophen (NORCO/VICODIN)  5-325 MG per tablet   Oral   Take 1 tablet by mouth every 4 (four) hours as needed for pain.   15 tablet   0   . methocarbamol (ROBAXIN) 500 MG tablet   Oral   Take 1 tablet by mouth every 8 (eight) hours as needed.         . sitaGLIPtin (JANUVIA) 100 MG tablet   Oral   Take 100 mg by mouth daily.         . Rivaroxaban (XARELTO) 15 MG TABS tablet   Oral   Take 1 tablet (15 mg total) by mouth 2 (two) times daily.   42 tablet   0    BP 159/85  Pulse 70  Temp(Src) 98.3 F (36.8 C) (Oral)  Resp 16  Wt 147 lb 14.4 oz (67.087 kg)  BMI 27.04 kg/m2  SpO2 97%  LMP 07/25/2013 Physical Exam  Nursing note and vitals reviewed. Constitutional: She is  oriented to person, place, and time. She appears well-developed and well-nourished.  HENT:  Head: Normocephalic.  Eyes: Pupils are equal, round, and reactive to light.  Neck: Muscular tenderness present. No spinous process tenderness present. Carotid bruit is not present. No rigidity. No edema and no erythema present.    Cardiovascular: Normal rate and regular rhythm.   Pulmonary/Chest: Effort normal.  Abdominal: Soft.  Musculoskeletal: Normal range of motion. She exhibits no edema and no tenderness.  Swelling of the right arm, strong.  Distal pulses  Neurological: She is alert and oriented to person, place, and time.  Skin: Skin is warm. No rash noted. No erythema.    ED Course  Procedures (including critical care time) Labs Review Labs Reviewed  CBC WITH DIFFERENTIAL - Abnormal; Notable for the following:    WBC 11.4 (*)    Hemoglobin 16.0 (*)    MCHC 36.2 (*)    All other components within normal limits  POCT I-STAT, CHEM 8 - Abnormal; Notable for the following:    Glucose, Bld 244 (*)    Hemoglobin 16.7 (*)    HCT 49.0 (*)    All other components within normal limits  PROTIME-INR   Imaging Review Ct Chest W Contrast  08/12/2013   CLINICAL DATA:  Right lateral lower neck pain for 1.5 weeks. Right-sided DVT. Right mastectomy. Evaluate for right subclavian vein thrombosis  EXAM: CT CHEST WITH CONTRAST  TECHNIQUE: Multidetector CT imaging of the chest was performed during intravenous contrast administration.  CONTRAST:  80mL OMNIPAQUE IOHEXOL 300 MG/ML  SOLN  COMPARISON:  02/05/2012.  FINDINGS: Lungs/pleura: Mild subsegmental atelectasis at the lung bases. No pleural fluid.  Heart/mediastinum: No supraclavicular adenopathy. Right axillary node dissection. Right breast implant. No axillary adenopathy. Mildly tortuous descending thoracic aorta. Heart size upper normal, without pericardial effusion. No central pulmonary embolism, on this non-dedicated study. The left axillary/  subclavian and brachiocephalic veins are patent. The jugular veins both appear patent. There is a short segment of right subclavian vein which appears relatively hypo enhancing, including on coronal image 38. No surrounding edema or enlargement of the vein.  No mediastinal or hilar adenopathy. Tiny hiatal hernia. No internal mammary adenopathy.  Upper abdomen: Similar calcification hepatic dome which is likely related to remote infection. A similar hepatic dome low-density lesion which is likely a cyst of 9 mm.  Left adrenal appears mildly nodular on image 49/series 2. This is similar to on the prior. Bone windows demonstrate no focal osseous lesion.  IMPRESSION: 1. Despite 2 attempts, an area  of relative hypoattenuation in the right subclavian vein persists. This is favored to be related to bolus timing and beam hardening artifact from adjacent osseous structures. Chronic thrombus cannot be excluded. Consider further evaluation with Doppler ultrasound focus on this area. The remainder of the upper thoracic venous structures are patent. 2. No evidence of metastatic disease from patient's breast cancer.   Electronically Signed   By: Jeronimo Greaves M.D.   On: 08/12/2013 10:39    EKG Interpretation   None       MDM   1. Subclavian vein thrombosis, right     Due to patient's recent history and significant medical history for, blood clot.  She will be admitted to the hospital, started on anticoagulant, have a vascular Doppler, in the morning    Arman Filter, NP 08/13/13 [phone]

## 2013-08-12 NOTE — ED Notes (Signed)
Blood pressure taken in leg, upper extremities compromised, nurse informed.

## 2013-08-12 NOTE — Progress Notes (Signed)
   CARE MANAGEMENT ED NOTE 08/12/2013  Patient:  Sherry [name]   Account Number:  [id number]  Date Initiated:  08/12/2013  Documentation initiated by:  Sherry [name]  Subjective/Objective Assessment:   Sherry [name] Disc[employer] Cost Assistance     Subjective/Objective Assessment Detail:     Action/Plan:   Action/Plan Detail:   Anticipated DC Date:  08/12/2013     Status Recommendation to Physician:   Result of Recommendation:  Agreed  Other ED Services  Consult Working Plan    DC Planning Services  CM consult  Medication Assistance    Choice offered to / List presented to:  C-1 Patient          Status of service:  Completed, signed off  ED Comments:   ED Comments Detail:  08/12/2013 14:36 Sherry Mcardle RN BSN 336 [phone] ED CM in to speak with patient in regards to [place] cost assistance. Benefit check done by [facility] CMA. She states, that [employer] will cover med and patient's copay would by $30 per month as per [facility]. Contacted the [facility] Path Department at [phone] spoke with Sherry [name] that verified as long as patient activates that savings card she will pay no more than $5 per month as long as there no government insurance supplement.  Discussed with patient the guidelines, and instructed patient on how to activate savings card, and where to redeem.  Pt verbalized understanding about the steps on activation process, and where to redeem. Savings card given.  Pt appreciative and agrees with plan. No further CM needs identified

## 2013-08-12 NOTE — Progress Notes (Signed)
VASCULAR LAB PRELIMINARY  PRELIMINARY  PRELIMINARY  PRELIMINARY  Right upper extremity venous Doppler completed.    Preliminary report:  There is acute DVT noted in the right subclavian vein.  All other veins appear thrombus free.  Charlis Harner, RVT 08/12/2013, 12:15 PM

## 2013-08-12 NOTE — ED Provider Notes (Signed)
9:25 AM Patient awaiting further scans to rule out clot in right subclavian vein.  Pain controlled at this time with another dose of dilaudid.  Patient aware of the delay and what we are looking for.    12:44 PM Notified by vascular tech of right subclavian venous thrombus.  Dr. Fonnie Jarvis has been consulted.  Will start patient on Xarelto 15mg  BID x 3 weeks.  Case Manager will be bringing down the pharmacy assistance card.  Will give first dose here.  Patient to follow up with her PCP at [facility].  She will also call Dr. Thomasena Edis and discuss further management of her surgical site with him.  [name], PA-C 08/12/13 1255

## 2013-08-12 NOTE — ED Provider Notes (Signed)
Medical screening examination/treatment/procedure(s) were performed by non-physician practitioner and as supervising physician I was immediately available for consultation/collaboration.  EKG Interpretation   None        Doug Sou, MD 08/12/13 [phone]

## 2013-08-12 NOTE — ED Notes (Signed)
Pt. reports right neck pain for 3 days worse with movement /certain positions . Denies injury / pt. stated recent surgery at left shoulder.

## 2013-08-12 NOTE — ED Notes (Addendum)
Returned from CT.

## 2013-08-13 ENCOUNTER — Ambulatory Visit (INDEPENDENT_AMBULATORY_CARE_PROVIDER_SITE_OTHER): Payer: Managed Care, Other (non HMO) | Admitting: Internal Medicine

## 2013-08-13 VITALS — BP 110/62 | HR 80 | Temp 97.7°F | Resp 18 | Ht 62.0 in | Wt 145.0 lb

## 2013-08-13 DIAGNOSIS — I82B19 Acute embolism and thrombosis of unspecified subclavian vein: Secondary | ICD-10-CM

## 2013-08-13 DIAGNOSIS — M542 Cervicalgia: Secondary | ICD-10-CM

## 2013-08-13 DIAGNOSIS — C50911 Malignant neoplasm of unspecified site of right female breast: Secondary | ICD-10-CM

## 2013-08-13 DIAGNOSIS — C50919 Malignant neoplasm of unspecified site of unspecified female breast: Secondary | ICD-10-CM | POA: Insufficient documentation

## 2013-08-13 DIAGNOSIS — I82B11 Acute embolism and thrombosis of right subclavian vein: Secondary | ICD-10-CM

## 2013-08-13 DIAGNOSIS — E1310 Other specified diabetes mellitus with ketoacidosis without coma: Secondary | ICD-10-CM

## 2013-08-13 DIAGNOSIS — I82409 Acute embolism and thrombosis of unspecified deep veins of unspecified lower extremity: Secondary | ICD-10-CM

## 2013-08-13 DIAGNOSIS — E1110 Type 2 diabetes mellitus with ketoacidosis without coma: Secondary | ICD-10-CM

## 2013-08-13 DIAGNOSIS — E119 Type 2 diabetes mellitus without complications: Secondary | ICD-10-CM | POA: Insufficient documentation

## 2013-08-13 HISTORY — DX: Acute embolism and thrombosis of unspecified deep veins of unspecified lower extremity: I82.409

## 2013-08-13 LAB — POCT GLYCOSYLATED HEMOGLOBIN (HGB A1C): Hemoglobin A1C: 8.7

## 2013-08-13 LAB — GLUCOSE, POCT (MANUAL RESULT ENTRY): POC Glucose: 220 mg/dl — AB (ref 70–99)

## 2013-08-13 MED ORDER — METFORMIN HCL 500 MG PO TABS: 500.0000 mg | ORAL_TABLET | Freq: Two times a day (BID) | ORAL | Status: DC

## 2013-08-13 MED ORDER — SITAGLIPTIN PHOSPHATE 100 MG PO TABS: 100.0000 mg | ORAL_TABLET | Freq: Every day | ORAL | Status: AC

## 2013-08-13 NOTE — Patient Instructions (Addendum)
Take Metformin 1/2 tab in the morning and 1/2 tab at night. Continue Januvia     1500 Calorie Diabetic Diet The 1500 calorie diabetic diet limits calories to 1500 each day. Following this diet and making healthy meal choices can help improve overall health. It controls blood glucose (sugar) levels and can also help lower blood pressure and cholesterol.  SERVING SIZES Measuring foods and serving sizes helps to make sure you are getting the right amount of food. The list below tells how big or small some common serving sizes are.  1 oz.........4 stacked dice.  3 oz........Marland KitchenDeck of cards.  1 tsp.......Marland KitchenTip of little finger.  1 tbs......Marland KitchenMarland KitchenThumb.  2 tbs.......Marland KitchenGolf ball.   cup......Marland KitchenHalf of a fist.  1 cup.......Marland KitchenA fist. GUIDELINES FOR CHOOSING FOODS The goal of this diet is to eat a variety of foods and limit calories to 1500 each day. This can be done by choosing foods that are low in calories and fat. The diet also suggests eating small amounts of food frequently. Doing this helps control your blood glucose levels, so they do not get too high or too low. Each meal or snack may include a protein food source to help you feel more satisfied. Try to eat about the same amount of food around the same time each day. This includes weekend days, travel days, and days off work. Space your meals about 4 to 5 hours apart, and add a snack between them, if you wish.  For example, a daily food plan could include breakfast, a morning snack, lunch, dinner, and an evening snack. Healthy meals and snacks have different types of foods, including whole grains, vegetables, fruits, lean meats, poultry, fish, and dairy products. As you plan your meals, select a variety of foods. Choose from the bread and starch, vegetable, fruit, dairy, and meat/protein groups. Examples of foods from each group are listed below, with their suggested serving sizes. Use measuring cups and spoons to become familiar with what a healthy  portion looks like. Bread and Starch Each serving equals 15 grams of carbohydrate.  1 slice bread.   bagel.   cup cold cereal (unsweetened).   cup hot cereal or mashed potatoes.  1 small potato (size of a computer mouse).   cup cooked pasta or rice.   English muffin.  1 cup broth-based soup.  3 cups of popcorn.  4 to 6 whole-wheat crackers.   cup cooked beans, peas, or corn. Vegetables Each serving equals 5 grams of carbohydrate.   cup cooked vegetables.  1 cup raw vegetables.   cup tomato or vegetable juice. Fruit Each serving equals 15 grams of carbohydrate.  1 small apple or orange.  1  cup watermelon or strawberries.   cup applesauce (no sugar added).  2 tbs raisins.   banana.   cup canned fruit, packed in water or in its own juice.   cup unsweetened fruit juice. Dairy Each serving equals 12 to 15 grams of carbohydrate.  1 cup fat-free milk.  6 oz artificially sweetened yogurt or plain yogurt.  1 cup low-fat buttermilk.  1 cup soy milk.  1 cup almond milk. Meat/Protein  1 large egg.  2 to 3 oz meat, poultry, or fish.   cup low-fat cottage cheese.  1 tbs peanut butter.  1 oz low-fat cheese.   cup tuna, packed in water.   cup tofu. Fat  1 tsp oil.  1 tsp trans-fat-free margarine.  1 tsp butter.  1 tsp mayonnaise.  2 tbs avocado.  1 tbs salad dressing.  1 tbs cream cheese.  2 tbs sour cream. SAMPLE 1500 CALORIE DIET PLAN Breakfast   whole-wheat English muffin (1 carb serving).  1 tsp trans-fat-free margarine.  1 scrambled egg.  1 cup fat-free milk (1 carb serving).  1 small orange (1 carb serving). Lunch  Chicken wrap.  1 whole-wheat tortilla, 8-inch (1 carb servings).  2 oz chicken breast, sliced.  2 tbs low-fat salad dressing, such as [place].   cup shredded lettuce.  2 slices tomato.   cup carrot sticks.  1 small apple (1 carb serving). Afternoon Snack  3 graham cracker  squares (1 carb serving).  1 tbs peanut butter. Dinner  2 oz lean pork chop, broiled.  1 cup brown rice (3 carb servings).   cup steamed carrots.   cup green beans.  1 cup fat-free milk (1 carb serving).  1 tsp trans-fat-free margarine. Evening Snack   cup low-fat cottage cheese.  1 small peach or pear, sliced (or  cup canned in water) (1 carb serving). MEAL PLAN You can use this worksheet to help you make a daily meal plan based on the 1500 calorie diabetic diet suggestions. If you are using this plan to help you control your blood glucose, you may interchange carbohydrate containing foods (dairy, starches, and fruits). Select a variety of fresh foods of varying colors and flavors. The total amount of carbohydrate in your meals or snacks is more important than making sure you include all of the food groups every time you eat. You can choose from approximately this many of the following foods to build your day's meals:  6 Starches.  3 Vegetables.  2 Fruits.  2 Dairy.  4 to 6 oz Meat/Protein.  Up to 3 Fats. Your dietician can use this worksheet to help you decide how many servings and which types of foods are right for you. BREAKFAST Food Group and Servings / Food Choice Starch _________________________________________________________ Dairy __________________________________________________________ Fruit ___________________________________________________________ Meat/Protein____________________________________________________ Fat ____________________________________________________________ LUNCH Food Group and Servings / Food Choice  Starch _________________________________________________________ Meat/Protein ___________________________________________________ Vegetables _____________________________________________________ Fruit __________________________________________________________ Dairy __________________________________________________________ Fat  ____________________________________________________________ Sherry [name] Food Group and Servings / Food Choice Dairy __________________________________________________________ Starch _________________________________________________________ Meat/Protein____________________________________________________ Sherry [name] ___________________________________________________________ Sherry [name] Food Group and Servings / Food Choice Starch _________________________________________________________ Meat/Protein ___________________________________________________ Dairy __________________________________________________________ Vegetable ______________________________________________________ Fruit ___________________________________________________________ Fat ____________________________________________________________ Sherry [name] Food Group and Servings / Food Choice Fruit ___________________________________________________________ Meat/Protein ____________________________________________________ Dairy __________________________________________________________ Starch __________________________________________________________ DAILY TOTALS Starches _________________________ Vegetables _______________________ Fruits ____________________________ Dairy ____________________________ Meat/Protein_____________________ Fats _____________________________ Document Released: 04/22/2005 Document Revised: 12/23/2011 Document Reviewed: 08/17/2009 [employer] Patient Information 2014 [employer]

## 2013-08-13 NOTE — ED Provider Notes (Signed)
Medical screening examination/treatment/procedure(s) were performed by non-physician practitioner and as supervising physician I was immediately available for consultation/collaboration.    Dione Booze, MD 08/13/13 [phone]

## 2013-08-13 NOTE — Progress Notes (Signed)
Subjective:    Patient ID: Sherry [name], female    DOB: 05/11/1966, 47 y.o.   MRN: [id number]  HPI  47 years old female come to the office for follow up visit. Was to [facility] - ED for blood cloth on Rt side of neck. She was under observation 17 hrs. Had CT scan and [place]. She is here also to get refill on Januvia. 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 Her sxs are neck pain and pain moving right arm. She has no swelling, arms are equal in size, has full intact nms status. Hx of previous thrombosis same arm and neck, also breast cancer with mastectomy on right. She had mobilization surgery of left  frozen shoulder 2 weeks ago. She is out of medication for diabetes, is on Januvia by hx.  Review of Systems     Objective:   Physical Exam  Vitals reviewed. Constitutional: She is oriented to person, place, and time. She appears well-developed and well-nourished. No distress.  HENT:  Head: Normocephalic.  Eyes: EOM are normal. No scleral icterus.  Neck: Normal range of motion. No tracheal deviation present. No thyromegaly present.  Cardiovascular: Normal rate, regular rhythm, normal heart sounds, intact distal pulses and normal pulses.   Pulmonary/Chest: Effort normal and breath sounds normal.  Musculoskeletal: Normal range of motion. She exhibits tenderness.  Lymphadenopathy:    She has no cervical adenopathy.  Neurological: She is alert and oriented to person, place, and time. She has normal strength. No cranial nerve deficit or sensory deficit. She displays a negative Romberg sign. Coordination normal.     Results for orders placed during the hospital encounter of 08/12/13  CBC WITH DIFFERENTIAL      Result Value Range   WBC 11.4 (*) 4.0 - 10.5 K/uL   RBC 4.90  3.87 - 5.11 MIL/uL   Hemoglobin 16.0 (*) 12.0 - 15.0 g/dL   HCT 40.9  81.1 - 91.4 %   MCV 90.2  78.0 - 100.0 fL   MCH 32.7  26.0 - 34.0 pg   MCHC 36.2 (*) 30.0 - 36.0 g/dL   RDW 78.2  95.6 -  21.3 %   Platelets 259  150 - 400 K/uL   Neutrophils Relative % 64  43 - 77 %   Neutro Abs 7.3  1.7 - 7.7 K/uL   Lymphocytes Relative 25  12 - 46 %   Lymphs Abs 2.9  0.7 - 4.0 K/uL   Monocytes Relative 7  3 - 12 %   Monocytes Absolute 0.8  0.1 - 1.0 K/uL   Eosinophils Relative 2  0 - 5 %   Eosinophils Absolute 0.3  0.0 - 0.7 K/uL   Basophils Relative 1  0 - 1 %   Basophils Absolute 0.1  0.0 - 0.1 K/uL  PROTIME-INR      Result Value Range   Prothrombin Time 13.4  11.6 - 15.2 seconds   INR 1.04  0.00 - 1.49  POCT I-STAT, CHEM 8      Result Value Range   Sodium 139  135 - 145 mEq/L   Potassium 3.7  3.5 - 5.1 mEq/L   Chloride 104  96 - 112 mEq/L   BUN 8  6 - 23 mg/dL   Creatinine, Ser 0.86  0.50 - 1.10 mg/dL   Glucose, Bld 578 (*) 70 - 99 mg/dL   Calcium, Ion 4.69  6.29 - 1.23 mmol/L   TCO2 23  0 -  100 mmol/L   Hemoglobin 16.7 (*) 12.0 - 15.0 g/dL   HCT 78.2 (*) 95.6 - 21.3 %   Results for orders placed in visit on 08/13/13  POCT GLYCOSYLATED HEMOGLOBIN (HGB A1C)      Result Value Range   Hemoglobin A1C 8.7    GLUCOSE, POCT (MANUAL RESULT ENTRY)      Result Value Range   POC Glucose 220 (*) 70 - 99 mg/dl        Assessment & Plan:  Subclavian vein thrombosis right NIDDM/restart metformin Continue xarelto is picking up starting rx todaysmall dose and rf [place] See Dr. Milus Glazier for his opinion Sunday early

## 2013-08-13 NOTE — Progress Notes (Signed)
  Subjective:    Patient ID: Sherry [name], female    DOB: 1965/12/18, 47 y.o.   MRN: [id number]  HPI    Review of Systems     Objective:   Physical Exam   Results for orders placed during the hospital encounter of 08/12/13  CBC WITH DIFFERENTIAL      Result Value Range   WBC 11.4 (*) 4.0 - 10.5 K/uL   RBC 4.90  3.87 - 5.11 MIL/uL   Hemoglobin 16.0 (*) 12.0 - 15.0 g/dL   HCT 14.7  82.9 - 56.2 %   MCV 90.2  78.0 - 100.0 fL   MCH 32.7  26.0 - 34.0 pg   MCHC 36.2 (*) 30.0 - 36.0 g/dL   RDW 13.0  86.5 - 78.4 %   Platelets 259  150 - 400 K/uL   Neutrophils Relative % 64  43 - 77 %   Neutro Abs 7.3  1.7 - 7.7 K/uL   Lymphocytes Relative 25  12 - 46 %   Lymphs Abs 2.9  0.7 - 4.0 K/uL   Monocytes Relative 7  3 - 12 %   Monocytes Absolute 0.8  0.1 - 1.0 K/uL   Eosinophils Relative 2  0 - 5 %   Eosinophils Absolute 0.3  0.0 - 0.7 K/uL   Basophils Relative 1  0 - 1 %   Basophils Absolute 0.1  0.0 - 0.1 K/uL  PROTIME-INR      Result Value Range   Prothrombin Time 13.4  11.6 - 15.2 seconds   INR 1.04  0.00 - 1.49  POCT I-STAT, CHEM 8      Result Value Range   Sodium 139  135 - 145 mEq/L   Potassium 3.7  3.5 - 5.1 mEq/L   Chloride 104  96 - 112 mEq/L   BUN 8  6 - 23 mg/dL   Creatinine, Ser 6.96  0.50 - 1.10 mg/dL   Glucose, Bld 295 (*) 70 - 99 mg/dL   Calcium, Ion 2.84  1.32 - 1.23 mmol/L   TCO2 23  0 - 100 mmol/L   Hemoglobin 16.7 (*) 12.0 - 15.0 g/dL   HCT 44.0 (*) 10.2 - 72.5 %        Assessment & Plan:

## 2013-08-15 ENCOUNTER — Ambulatory Visit (INDEPENDENT_AMBULATORY_CARE_PROVIDER_SITE_OTHER): Payer: Managed Care, Other (non HMO) | Admitting: Family Medicine

## 2013-08-15 VITALS — BP 128/72 | HR 78 | Temp 97.4°F | Resp 16 | Ht 62.0 in | Wt 149.0 lb

## 2013-08-15 DIAGNOSIS — M79609 Pain in unspecified limb: Secondary | ICD-10-CM

## 2013-08-15 DIAGNOSIS — M542 Cervicalgia: Secondary | ICD-10-CM

## 2013-08-15 DIAGNOSIS — T8172XA Complication of vein following a procedure, not elsewhere classified, initial encounter: Secondary | ICD-10-CM

## 2013-08-15 DIAGNOSIS — I999 Unspecified disorder of circulatory system: Secondary | ICD-10-CM

## 2013-08-15 MED ORDER — ENOXAPARIN SODIUM 120 MG/0.8ML ~~LOC~~ SOLN: 120.0000 mg | Freq: Every day | SUBCUTANEOUS | Status: DC

## 2013-08-15 NOTE — Progress Notes (Signed)
This chart was scribed for Elvina Sidle, MD by Caryn Bee, Medical Scribe. This patient was seen in Room/bed 09 and the patient's care was started at 9:03 AM.  Subjective:    Patient ID: Sherry [name], female    DOB: [birth year], 47 y.o.   MRN: [id number]  HPI HPI Comments: Sherry [name] is a 47 y.o. female with h/o of blood clots who presents to [facility] complaining of gradual onset neck pain that began after she had surgery on her shoulder. She had shoulder surgery on July 27, 2013 done by Dr. Thomasena Edis. Pt went to her surgeon's PA for f/u and was told the pain was from lying on her side during surgery. Pt then went to the ED where they administered 2 CT scans and 1 ultrasound and found that she had another clot. Pt was prescribed Xarelto which she has taken with no relief. The last time she had a clot she was injecting shots in her stomach. Pt states that she only gets blood clots when she has surgery on the upper half of her body. She did not get a clot when she had back surgery last year. She has not been prescribed Coumadin. She has h/o DM. Pt denies chest pain. She does not work.  Review of Systems  Cardiovascular: Negative for chest pain.  Musculoskeletal: Positive for neck pain.   Past Surgical History  Procedure Laterality Date  . Cesarean section  1992/1995  . Mastectomy  04/27/10    right  . Breast surgery    . Breast reconstruction  7/12  . Port a cath placed  2011  . Port a cath removed    . Lumbar laminectomy/decompression microdiscectomy  06/04/2012    Procedure: LUMBAR LAMINECTOMY/DECOMPRESSION MICRODISCECTOMY 1 LEVEL;  Surgeon: Venita Lick, MD;  Location: [facility] OR;  Service: Orthopedics;  Laterality: N/A;  L4-5 BILATERAL HEMILAMINECTOMY/MICRODISCECTOMY  . Back surgery    . Tubal ligation     History   Social History  . Marital Status: Married    Spouse Name: N/A    Number of Children: N/A  . Years of Education: N/A   Occupational History  . Not on file.    Social History Main Topics  . Smoking status: Former Smoker -- 1.00 packs/day for 28 years  . Smokeless tobacco: Not on file  . Alcohol Use: No  . Drug Use: No  . Sexual Activity: Not on file   Other Topics Concern  . Not on file   Social History Narrative  . No narrative on file   Past Medical History  Diagnosis Date  . Vitamin D deficiency   . Gallstones   . Breast cancer 2010    s/p mastectomy  . Sinus infection     pt is on Doxycycline  . History of blood clots     neck-after reconstructive surgery  . Chronic back pain     bulding/herniated disc  . Hemorrhoids   . Constipation     related to pain meds and takes stool softener prn  . Nocturia   . Diabetes mellitus     takes Saxagliptin-Metformin daily  . Lyme disease     taking Doxycycline-to finish up 06/03/12  . Frozen shoulder    Family History  Problem Relation Age of Onset  . Diabetes Mother   . Diabetes Sister    Allergies  Allergen Reactions  . Tramadol Other (See Comments)    Trouble sleeping and hyper        Objective:  Physical Exam  Nursing note and vitals reviewed. Constitutional: She is oriented to person, place, and time. She appears well-developed. No distress.  HENT:  Head: Atraumatic.  Neurological: She is alert and oriented to person, place, and time.  Skin: She is not diaphoretic.   HEENT: Unremarkable Neck: Supple with some fullness in the right anterior neck. No thyromegaly. No acute distress      Assessment & Plan:  The encounter diagnosis was Thrombophlebitis - postoperative, initial encounter.   Thrombophlebitis - postoperative, initial encounter - Plan: enoxaparin (LOVENOX) 120 MG/0.8ML injection  Neck pain on right side  Signed, Elvina Sidle, MD

## 2013-08-26 ENCOUNTER — Emergency Department (HOSPITAL_COMMUNITY)
Admission: EM | Admit: 2013-08-26 | Discharge: 2013-08-26 | Disposition: A | Discharge status: Home / Self Care | Payer: Managed Care, Other (non HMO) | Attending: Emergency Medicine | Admitting: Emergency Medicine

## 2013-08-26 ENCOUNTER — Encounter (HOSPITAL_COMMUNITY): Payer: Self-pay | Admitting: Emergency Medicine

## 2013-08-26 DIAGNOSIS — Z8739 Personal history of other diseases of the musculoskeletal system and connective tissue: Secondary | ICD-10-CM | POA: Insufficient documentation

## 2013-08-26 DIAGNOSIS — Z86718 Personal history of other venous thrombosis and embolism: Secondary | ICD-10-CM | POA: Insufficient documentation

## 2013-08-26 DIAGNOSIS — K0889 Other specified disorders of teeth and supporting structures: Secondary | ICD-10-CM

## 2013-08-26 DIAGNOSIS — Z7901 Long term (current) use of anticoagulants: Secondary | ICD-10-CM | POA: Insufficient documentation

## 2013-08-26 DIAGNOSIS — Z853 Personal history of malignant neoplasm of breast: Secondary | ICD-10-CM | POA: Insufficient documentation

## 2013-08-26 DIAGNOSIS — Z8639 Personal history of other endocrine, nutritional and metabolic disease: Secondary | ICD-10-CM | POA: Insufficient documentation

## 2013-08-26 DIAGNOSIS — H9209 Otalgia, unspecified ear: Secondary | ICD-10-CM | POA: Insufficient documentation

## 2013-08-26 DIAGNOSIS — K089 Disorder of teeth and supporting structures, unspecified: Secondary | ICD-10-CM | POA: Insufficient documentation

## 2013-08-26 DIAGNOSIS — E119 Type 2 diabetes mellitus without complications: Secondary | ICD-10-CM | POA: Insufficient documentation

## 2013-08-26 DIAGNOSIS — Z79899 Other long term (current) drug therapy: Secondary | ICD-10-CM | POA: Insufficient documentation

## 2013-08-26 DIAGNOSIS — Z8619 Personal history of other infectious and parasitic diseases: Secondary | ICD-10-CM | POA: Insufficient documentation

## 2013-08-26 DIAGNOSIS — G8929 Other chronic pain: Secondary | ICD-10-CM | POA: Insufficient documentation

## 2013-08-26 DIAGNOSIS — F17200 Nicotine dependence, unspecified, uncomplicated: Secondary | ICD-10-CM | POA: Insufficient documentation

## 2013-08-26 MED ORDER — OXYCODONE-ACETAMINOPHEN 5-325 MG PO TABS
1.0000 | ORAL_TABLET | Freq: Once | ORAL | Status: AC
  Administered 2013-08-26: 1 via ORAL
  Filled 2013-08-26: qty 1

## 2013-08-26 MED ORDER — PENICILLIN V POTASSIUM 500 MG PO TABS: 500.0000 mg | ORAL_TABLET | Freq: Three times a day (TID) | ORAL | Status: DC

## 2013-08-26 MED ORDER — HYDROCODONE-ACETAMINOPHEN 5-325 MG PO TABS: 1.0000 | ORAL_TABLET | ORAL | Status: DC | PRN

## 2013-08-26 NOTE — ED Notes (Signed)
Pt c/o toothache on left lower side also st's left ear pain.  Onset 2 days ago.  St's has been taking Vicodin without relief of pain

## 2013-08-26 NOTE — ED Provider Notes (Signed)
CSN: [id number]     Arrival date & time 08/26/13  2038 History   First MD Initiated Contact with Patient 08/26/13 2102     Chief Complaint  Patient presents with  . Dental Pain  . Otalgia   (Consider location/radiation/quality/duration/timing/severity/associated sxs/prior Treatment) HPI  Pt presents c/o of dental pain radiates to L ear for the past 2 days.  Pain is gradual onset, sharp, throbbing, worsening with chewing, cold air and drinking cold water.  Pain minimally improved with taking her home medicine norco.  Has a dentist but has not f/u recently.  Denies any recent trauma.  Denies fever, chills, hearing changes, neck pain, cp, sob, or rash.  Last dose of pain medication 4 hrs ago.    Past Medical History  Diagnosis Date  . Vitamin D deficiency   . Gallstones   . Breast cancer 2010    s/p mastectomy  . Sinus infection     pt is on Doxycycline  . History of blood clots     neck-after reconstructive surgery  . Chronic back pain     bulding/herniated disc  . Hemorrhoids   . Constipation     related to pain meds and takes stool softener prn  . Nocturia   . Diabetes mellitus     takes Saxagliptin-Metformin daily  . Lyme disease     taking Doxycycline-to finish up 06/03/12  . Frozen shoulder    Past Surgical History  Procedure Laterality Date  . Cesarean section  1992/1995  . Mastectomy  04/27/10    right  . Breast surgery    . Breast reconstruction  7/12  . Port a cath placed  2011  . Port a cath removed    . Lumbar laminectomy/decompression microdiscectomy  06/04/2012    Procedure: LUMBAR LAMINECTOMY/DECOMPRESSION MICRODISCECTOMY 1 LEVEL;  Surgeon: Venita Lick, MD;  Location: [facility] OR;  Service: Orthopedics;  Laterality: N/A;  L4-5 BILATERAL HEMILAMINECTOMY/MICRODISCECTOMY  . Back surgery    . Tubal ligation     Family History  Problem Relation Age of Onset  . Diabetes Mother   . Diabetes Sister    History  Substance Use Topics  . Smoking status: Current Every  Day Smoker -- 1.00 packs/day for 28 years  . Smokeless tobacco: Not on file  . Alcohol Use: No   OB History   Grav Para Term Preterm Abortions TAB SAB Ect Mult Living                 Review of Systems  Constitutional: Negative for fever.  HENT: Positive for dental problem and ear pain.   Skin: Negative for pallor and rash.    Allergies  Tramadol  Home Medications   Current Outpatient Rx  Name  Route  Sig  Dispense  Refill  . enoxaparin (LOVENOX) 120 MG/0.8ML injection   Subcutaneous   Inject 0.8 mLs (120 mg total) into the skin daily.   30 Syringe   5   . HYDROcodone-acetaminophen (NORCO/VICODIN) 5-325 MG per tablet   Oral   Take 1 tablet by mouth every 4 (four) hours as needed for pain.   15 tablet   0   . metFORMIN (GLUCOPHAGE) 500 MG tablet   Oral   Take 1 tablet (500 mg total) by mouth 2 (two) times daily with a meal.   60 tablet   3   . methocarbamol (ROBAXIN) 500 MG tablet   Oral   Take 1 tablet by mouth every 8 (eight) hours as needed for  muscle spasms.          . sitaGLIPtin (JANUVIA) 100 MG tablet   Oral   Take 1 tablet (100 mg total) by mouth daily.   90 tablet   3    BP 179/107  Pulse 94  Temp(Src) 98.1 F (36.7 C) (Oral)  Resp 16  Ht 5\' 2"  (1.575 m)  Wt 146 lb 14.4 oz (66.633 kg)  BMI 26.86 kg/m2  SpO2 98%  LMP 07/27/2013 Physical Exam  Nursing note and vitals reviewed. Constitutional: She appears well-developed and well-nourished. No distress.  HENT:  Head: Normocephalic and atraumatic.  Right Ear: External ear normal.  Left Ear: External ear normal.  Nose: Nose normal.  Mouth/Throat: Oropharynx is clear and moist.    Eyes: Conjunctivae are normal.  Neck: Normal range of motion. Neck supple. No thyromegaly present.  Lymphadenopathy:    She has no cervical adenopathy.  Neurological: She is alert.  Skin: No erythema.  Psychiatric: She has a normal mood and affect.    ED Course  Procedures (including critical care  time)  10:00 PM Pt here with dental pain.  No obvious abscess, no trismus.  Pain likely dental as it is worsen with chewing, cold air.  No evidence of ear infection.  Is afebrile, is hypertensive likely 2/2 to pain.  Recommend f/u with PCP for recheck.  Will d/c with pain meds, abx and close f/u with dentist.  Return precaution discussed.    Labs Review Labs Reviewed - No data to display Imaging Review No results found.  EKG Interpretation   None       MDM   1. Pain, dental    BP 179/107  Pulse 94  Temp(Src) 98.1 F (36.7 C) (Oral)  Resp 16  Ht 5\' 2"  (1.575 m)  Wt 146 lb 14.4 oz (66.633 kg)  BMI 26.86 kg/m2  SpO2 98%  LMP 07/27/2013     Fayrene Helper, PA-C 08/26/13 2205

## 2013-08-26 NOTE — ED Notes (Signed)
Patient with left ear and facial and dental pain.  She states that the pain started yesterday.

## 2013-08-26 NOTE — ED Provider Notes (Signed)
Medical screening examination/treatment/procedure(s) were performed by non-physician practitioner and as supervising physician I was immediately available for consultation/collaboration.      Charles B. Sheldon, MD 08/26/13 2222 

## 2013-09-03 ENCOUNTER — Emergency Department (HOSPITAL_COMMUNITY)
Admission: EM | Admit: 2013-09-03 | Discharge: 2013-09-04 | Disposition: A | Discharge status: Home / Self Care | Payer: Managed Care, Other (non HMO) | Attending: Emergency Medicine | Admitting: Emergency Medicine

## 2013-09-03 ENCOUNTER — Ambulatory Visit (INDEPENDENT_AMBULATORY_CARE_PROVIDER_SITE_OTHER): Payer: Managed Care, Other (non HMO) | Admitting: Family Medicine

## 2013-09-03 ENCOUNTER — Encounter (HOSPITAL_COMMUNITY): Payer: Self-pay | Admitting: Emergency Medicine

## 2013-09-03 VITALS — BP 102/68 | HR 94 | Temp 97.8°F | Resp 18 | Ht 61.5 in | Wt 145.0 lb

## 2013-09-03 DIAGNOSIS — Z853 Personal history of malignant neoplasm of breast: Secondary | ICD-10-CM | POA: Insufficient documentation

## 2013-09-03 DIAGNOSIS — Z79899 Other long term (current) drug therapy: Secondary | ICD-10-CM | POA: Insufficient documentation

## 2013-09-03 DIAGNOSIS — E119 Type 2 diabetes mellitus without complications: Secondary | ICD-10-CM | POA: Insufficient documentation

## 2013-09-03 DIAGNOSIS — G8929 Other chronic pain: Secondary | ICD-10-CM | POA: Insufficient documentation

## 2013-09-03 DIAGNOSIS — K029 Dental caries, unspecified: Secondary | ICD-10-CM | POA: Insufficient documentation

## 2013-09-03 DIAGNOSIS — K089 Disorder of teeth and supporting structures, unspecified: Secondary | ICD-10-CM

## 2013-09-03 DIAGNOSIS — K0889 Other specified disorders of teeth and supporting structures: Secondary | ICD-10-CM

## 2013-09-03 DIAGNOSIS — F17200 Nicotine dependence, unspecified, uncomplicated: Secondary | ICD-10-CM | POA: Insufficient documentation

## 2013-09-03 DIAGNOSIS — Z8719 Personal history of other diseases of the digestive system: Secondary | ICD-10-CM | POA: Insufficient documentation

## 2013-09-03 DIAGNOSIS — Z8679 Personal history of other diseases of the circulatory system: Secondary | ICD-10-CM | POA: Insufficient documentation

## 2013-09-03 NOTE — ED Notes (Signed)
Pt. reports persistent left lower molar pain unrelieved by prescription pain medication , currently taking oral Penicillin with no improvement.

## 2013-09-03 NOTE — Progress Notes (Signed)
[facility] [address] [place] [address] [phone]  Date:  09/03/2013   Name:  GWENETH FREDLUND   DOB:  1965/11/13   MRN:  [id number]  PCP:  Pcp Not In System    Chief Complaint: Dental Pain   History of Present Illness:  LAKENDRIA NICASTRO is a 47 y.o. very pleasant female patient who presents with the following:  Seen in the ED on 11/13 with dental pain.  She received penicillin and #7 vicodin 5mg .  Stated to me that she did not fill this vicodin rx because "my insurance would not cover it because I have been on this medicine for so long."   States that "I just need something to make the pain go away.  I need you to give me something else because the pharmacy won't give me more hydrocodone."  Also states that she went to the ED at [facility] but that she received tramadol and did not fill it because she is allergic (per records it causes "trouble sleeping and hyper").    She went to her dentist 2 days ago; her DDS is near [place] but she cannot remember the name of her dentist or the name of the practice.  Her dentist would like to pull two teeth per her report.  However, at this time she cannot have the tooth pulled due to being on blood thinner.  Her dentist has faxed [place] a letter regarding this which she thinks was sent to Dr. Perrin Maltese.      She reports that she has been taking her hydrocodone every 4 to 6 hours since the onset of this toothache; her controlled substance report reveals that she has been receiving regular vicodin from [facility] as well as from a few other providers.  It appears that her vicodin is being written for one every 8 hours per [facility].  She has received 120 in the last 30 days, and a phone call to her pharmacy reveals that she did fill the rx she got from the ED on 11/13 yesterday.  PharmD at her drug store expressed concern to me on the phone about her narcotic use and stated that she would be reluctant to fill any more  narcotic rx at this time.    Patient Active Problem List   Diagnosis Date Noted  . Diabetes 08/13/2013  . Breast cancer 08/13/2013  . Radiculopathy, lumbar region 05/26/2012    Past Medical History  Diagnosis Date  . Vitamin D deficiency   . Gallstones   . Breast cancer 2010    s/p mastectomy  . Sinus infection     pt is on Doxycycline  . History of blood clots     neck-after reconstructive surgery  . Chronic back pain     bulding/herniated disc  . Hemorrhoids   . Constipation     related to pain meds and takes stool softener prn  . Nocturia   . Diabetes mellitus     takes Saxagliptin-Metformin daily  . Lyme disease     taking Doxycycline-to finish up 06/03/12  . Frozen shoulder     Past Surgical History  Procedure Laterality Date  . Cesarean section  1992/1995  . Mastectomy  04/27/10    right  . Breast surgery    . Breast reconstruction  7/12  . Port a cath placed  2011  . Port a cath removed    . Lumbar laminectomy/decompression microdiscectomy  06/04/2012  Procedure: LUMBAR LAMINECTOMY/DECOMPRESSION MICRODISCECTOMY 1 LEVEL;  Surgeon: Venita Lick, MD;  Location: [facility] OR;  Service: Orthopedics;  Laterality: N/A;  L4-5 BILATERAL HEMILAMINECTOMY/MICRODISCECTOMY  . Back surgery    . Tubal ligation      History  Substance Use Topics  . Smoking status: Current Every Day Smoker -- 1.00 packs/day for 28 years  . Smokeless tobacco: Not on file  . Alcohol Use: No    Family History  Problem Relation Age of Onset  . Diabetes Mother   . Diabetes Sister     Allergies  Allergen Reactions  . Tramadol Other (See Comments)    Trouble sleeping and hyper    Medication list has been reviewed and updated.  Current Outpatient Prescriptions on File Prior to Visit  Medication Sig Dispense Refill  . enoxaparin (LOVENOX) 120 MG/0.8ML injection Inject 0.8 mLs (120 mg total) into the skin daily.  30 Syringe  5  . metFORMIN (GLUCOPHAGE) 500 MG tablet Take 1 tablet (500 mg  total) by mouth 2 (two) times daily with a meal.  60 tablet  3  . methocarbamol (ROBAXIN) 500 MG tablet Take 1 tablet by mouth every 8 (eight) hours as needed for muscle spasms.       . penicillin v potassium (VEETID) 500 MG tablet Take 1 tablet (500 mg total) by mouth 3 (three) times daily.  30 tablet  0  . sitaGLIPtin (JANUVIA) 100 MG tablet Take 1 tablet (100 mg total) by mouth daily.  90 tablet  3  . HYDROcodone-acetaminophen (NORCO/VICODIN) 5-325 MG per tablet Take 1 tablet by mouth every 4 (four) hours as needed.  7 tablet  0   No current facility-administered medications on file prior to visit.    Review of Systems:  As per HPI- otherwise negative.   Physical Examination: Filed Vitals:   09/03/13 1347  BP: 102/68  Pulse: 94  Temp: 97.8 F (36.6 C)  Resp: 18   Filed Vitals:   09/03/13 1347  Height: 5' 1.5" (1.562 m)  Weight: 145 lb (65.772 kg)   Body mass index is 26.96 kg/(m^2). Ideal Body Weight: Weight in (lb) to have BMI = 25: 134.2  GEN: WDWN, NAD, Non-toxic, A & O x 3,  here with her daughter.   HEENT: Atraumatic, Normocephalic. Neck supple. No masses, No LAD.  Bilateral TM wnl, oropharynx normal.  PEERL,EOMI.   Ears and Nose: No external deformity. CV: RRR, No M/G/R. No JVD. No thrill. No extra heart sounds. PULM: CTA B, no wheezes, crackles, rhonchi. No retractions. No resp. distress. No accessory muscle use. EXTR: No c/c/e NEURO Normal gait.  PSYCH: upset when I discussed my concerns about her narcotic use, crying and angry after this discussion Her teeth are in poor repair with several fillings.  She expresses tenderness at the left mandibular molars.  There is no redness, swelling or heat to suggest abscess  Assessment and Plan: Tooth pain  Chiquetta is here today asking me to give her something for pain besides vicodin.  She is 'allergic" to tramadol and wants something stronger so I would presume that oxycodone is her goal.  She states both that her  insurance will not pay for any more vicodin and that her pharmacy will not fill any more.  Explained to her that I am very concerned about her narcotic use and especially about her use of several different providers.  Over the last 5 months she has received narcotics from 6 different offices.  She has received #67 vicodin  5 in the last 11 days.  She and her daughter are both very upset that I am not able to do something to relieve her pain, and she requests that I have her tooth pulled right now.  Explained that I am not able to do this as we are not a dental office.  Offered to rx #10 vicodin with the agreement that I would then contact  ortho on Monday and ask them to start her on a pain contract for her to get her pain medications exclusively through them.  However she stated that she had an rx for #16 vicodin in the car (?from the dentist), that she "did not need your prescription" or anything else and left the office.    Signed Abbe Amsterdam, MD

## 2013-09-03 NOTE — ED Provider Notes (Signed)
CSN: [id number]     Arrival date & time 09/03/13  2327 History  This chart was scribed for non-physician practitioner Earley Favor, PA-C, working with Doug Sou, MD by Dorothey Baseman, ED Scribe. This patient was seen in room TR07C/TR07C and the patient's care was started at 11:56 PM.    Chief Complaint  Patient presents with  . Dental Pain   The history is provided by the patient. No language interpreter was used.   HPI Comments: Sherry [name] is a 47 y.o. female who presents to the Emergency Department complaining of a constant pain to the left, lower dentition onset PTA. Patient was seen here on 08/26/2013 for similar complaints and given Penicillin, which she states she is still currently taking, and hydrocodone. She reports that she followed up with the referred dentist, but that she could not have the tooth extracted without paperwork from her PCP involving her Lovanox. She states that she went to [facility] to see Dr. Perrin Maltese (who normally manages her Lovanox use), but states that she had to see another physician that would not give her the required paperwork. Patient also reports a history of DM. No new symptoms but is out of her pain medication    Past Medical History  Diagnosis Date  . Vitamin D deficiency   . Gallstones   . Breast cancer 2010    s/p mastectomy  . Sinus infection     pt is on Doxycycline  . History of blood clots     neck-after reconstructive surgery  . Chronic back pain     bulding/herniated disc  . Hemorrhoids   . Constipation     related to pain meds and takes stool softener prn  . Nocturia   . Diabetes mellitus     takes Saxagliptin-Metformin daily  . Lyme disease     taking Doxycycline-to finish up 06/03/12  . Frozen shoulder    Past Surgical History  Procedure Laterality Date  . Cesarean section  1992/1995  . Mastectomy  04/27/10    right  . Breast surgery    . Breast reconstruction  7/12  . Port a cath placed  2011  .  Port a cath removed    . Lumbar laminectomy/decompression microdiscectomy  06/04/2012    Procedure: LUMBAR LAMINECTOMY/DECOMPRESSION MICRODISCECTOMY 1 LEVEL;  Surgeon: Venita Lick, MD;  Location: [facility] OR;  Service: Orthopedics;  Laterality: N/A;  L4-5 BILATERAL HEMILAMINECTOMY/MICRODISCECTOMY  . Back surgery    . Tubal ligation     Family History  Problem Relation Age of Onset  . Diabetes Mother   . Diabetes Sister    History  Substance Use Topics  . Smoking status: Current Every Day Smoker -- 1.00 packs/day for 28 years  . Smokeless tobacco: Not on file  . Alcohol Use: No   OB History   Grav Para Term Preterm Abortions TAB SAB Ect Mult Living                 Review of Systems  HENT: Positive for dental problem.   All other systems reviewed and are negative.    A complete 10 system review of systems was obtained and all systems are negative except as noted in the HPI and PMH.   Allergies  Tramadol  Home Medications   Current Outpatient Rx  Name  Route  Sig  Dispense  Refill  . enoxaparin (LOVENOX) 120 MG/0.8ML injection   Subcutaneous   Inject 0.8 mLs (120 mg total)  into the skin daily.   30 Syringe   5   . HYDROcodone-acetaminophen (NORCO/VICODIN) 5-325 MG per tablet   Oral   Take 1 tablet by mouth every 4 (four) hours as needed.   7 tablet   0   . metFORMIN (GLUCOPHAGE) 500 MG tablet   Oral   Take 1 tablet (500 mg total) by mouth 2 (two) times daily with a meal.   60 tablet   3   . methocarbamol (ROBAXIN) 500 MG tablet   Oral   Take 1 tablet by mouth every 8 (eight) hours as needed for muscle spasms.          . penicillin v potassium (VEETID) 500 MG tablet   Oral   Take 1 tablet (500 mg total) by mouth 3 (three) times daily.   30 tablet   0   . sitaGLIPtin (JANUVIA) 100 MG tablet   Oral   Take 1 tablet (100 mg total) by mouth daily.   90 tablet   3   . HYDROcodone-acetaminophen (NORCO/VICODIN) 5-325 MG per tablet   Oral   Take 1 tablet by  mouth once.   30 tablet   0    Triage Vitals: BP 143/90  Pulse 92  Temp(Src) 98.3 F (36.8 C) (Oral)  Resp 18  Wt 145 lb 9.6 oz (66.044 kg)  SpO2 98%  LMP 07/27/2013  Physical Exam  Nursing note and vitals reviewed. Constitutional: She is oriented to person, place, and time. She appears well-developed and well-nourished. No distress.  HENT:  Head: Normocephalic and atraumatic.  Mouth/Throat:    Eyes: Conjunctivae are normal.  Neck: Normal range of motion. Neck supple.  Pulmonary/Chest: Effort normal. No respiratory distress.  Abdominal: She exhibits no distension.  Musculoskeletal: Normal range of motion.  Neurological: She is alert and oriented to person, place, and time.  Skin: Skin is warm and dry.  Psychiatric: She has a normal mood and affect. Her behavior is normal.    ED Course  Dental Date/Time: 09/04/2013 1:09 AM Performed by: Arman Filter Authorized by: Arman Filter Consent: Verbal consent obtained. written consent not obtained. Risks and benefits: risks, benefits and alternatives were discussed Consent given by: patient Patient identity confirmed: verbally with patient Preparation: Patient was prepped and draped in the usual sterile fashion. Local anesthesia used: yes Anesthesia: nerve block Local anesthetic: bupivacaine 0.5% with epinephrine Anesthetic total: 0.9 ml Patient sedated: no Patient tolerance: Patient tolerated the procedure well with no immediate complications.   (including critical care time)  DIAGNOSTIC STUDIES: Oxygen Saturation is 98% on room air, normal by my interpretation.    COORDINATION OF CARE: 12:00 AM- Advised patient to follow with Dr. Perrin Maltese and the dentist. Will discharge patient with pain medication. Discussed treatment plan with patient at bedside and patient verbalized agreement.     Labs Review Labs Reviewed - No data to display Imaging Review No results found.  EKG Interpretation   None       MDM    1. Complex dental cavity    Patient followed as instructed DDS xrays the area and has 2 large cavities, wants to extract the teeht but needs medical clearance due to Lovenox use Instructed Patient to see PCP tomorrow for medical clearance   I personally performed the services described in this documentation, which was scribed in my presence. The recorded information has been reviewed and is accurate.    Arman Filter, NP 09/04/13 0030  Arman Filter, NP 09/04/13 0110

## 2013-09-04 MED ORDER — HYDROCODONE-ACETAMINOPHEN 5-325 MG PO TABS: 1.0000 | ORAL_TABLET | Freq: Once | ORAL | Status: DC

## 2013-09-04 MED ORDER — OXYCODONE-ACETAMINOPHEN 5-325 MG PO TABS: 1.0000 | ORAL_TABLET | Freq: Four times a day (QID) | ORAL | Status: DC | PRN

## 2013-09-04 MED ORDER — HYDROCODONE-ACETAMINOPHEN 5-325 MG PO TABS
1.0000 | ORAL_TABLET | Freq: Once | ORAL | Status: AC
  Administered 2013-09-04: 1 via ORAL
  Filled 2013-09-04: qty 1

## 2013-09-04 NOTE — ED Notes (Signed)
EDNP at [facility] for nerve block

## 2013-09-04 NOTE — ED Provider Notes (Signed)
Medical screening examination/treatment/procedure(s) were performed by non-physician practitioner and as supervising physician I was immediately available for consultation/collaboration.  EKG Interpretation   None        Doug Sou, MD 09/04/13 [phone]

## 2013-09-04 NOTE — ED Notes (Signed)
C/o L lower tooth pain, onset 11/13, (denies: nv or fever), admits to some swelling and drainage, taking PCN, has an appt with a dentist, waiting for PCP to OK extraction d/t lovenox.

## 2013-09-06 ENCOUNTER — Emergency Department (HOSPITAL_COMMUNITY)
Admission: EM | Admit: 2013-09-06 | Discharge: 2013-09-06 | Disposition: A | Discharge status: Home / Self Care | Payer: Managed Care, Other (non HMO) | Attending: Emergency Medicine | Admitting: Emergency Medicine

## 2013-09-06 ENCOUNTER — Telehealth: Payer: Self-pay

## 2013-09-06 ENCOUNTER — Encounter: Payer: Self-pay | Admitting: Family Medicine

## 2013-09-06 ENCOUNTER — Encounter (HOSPITAL_COMMUNITY): Payer: Self-pay | Admitting: Emergency Medicine

## 2013-09-06 DIAGNOSIS — Z8739 Personal history of other diseases of the musculoskeletal system and connective tissue: Secondary | ICD-10-CM | POA: Insufficient documentation

## 2013-09-06 DIAGNOSIS — K5900 Constipation, unspecified: Secondary | ICD-10-CM | POA: Insufficient documentation

## 2013-09-06 DIAGNOSIS — E119 Type 2 diabetes mellitus without complications: Secondary | ICD-10-CM | POA: Insufficient documentation

## 2013-09-06 DIAGNOSIS — Z86718 Personal history of other venous thrombosis and embolism: Secondary | ICD-10-CM | POA: Insufficient documentation

## 2013-09-06 DIAGNOSIS — Z853 Personal history of malignant neoplasm of breast: Secondary | ICD-10-CM | POA: Insufficient documentation

## 2013-09-06 DIAGNOSIS — Z9889 Other specified postprocedural states: Secondary | ICD-10-CM | POA: Insufficient documentation

## 2013-09-06 DIAGNOSIS — Z792 Long term (current) use of antibiotics: Secondary | ICD-10-CM | POA: Insufficient documentation

## 2013-09-06 DIAGNOSIS — K089 Disorder of teeth and supporting structures, unspecified: Secondary | ICD-10-CM | POA: Insufficient documentation

## 2013-09-06 DIAGNOSIS — F17200 Nicotine dependence, unspecified, uncomplicated: Secondary | ICD-10-CM | POA: Insufficient documentation

## 2013-09-06 DIAGNOSIS — K0889 Other specified disorders of teeth and supporting structures: Secondary | ICD-10-CM

## 2013-09-06 DIAGNOSIS — G8929 Other chronic pain: Secondary | ICD-10-CM | POA: Insufficient documentation

## 2013-09-06 DIAGNOSIS — Z8619 Personal history of other infectious and parasitic diseases: Secondary | ICD-10-CM | POA: Insufficient documentation

## 2013-09-06 DIAGNOSIS — Z7901 Long term (current) use of anticoagulants: Secondary | ICD-10-CM | POA: Insufficient documentation

## 2013-09-06 DIAGNOSIS — Z79899 Other long term (current) drug therapy: Secondary | ICD-10-CM | POA: Insufficient documentation

## 2013-09-06 NOTE — Telephone Encounter (Signed)
Discussed with Dr Patsy Lager. Patient needs clearance needed to d/c Lovenox.

## 2013-09-06 NOTE — Telephone Encounter (Signed)
Please refer to letter I wrote this morning.  Stop Lovenox 24 hours before procedure and restart 12 hours afterwards.

## 2013-09-06 NOTE — Telephone Encounter (Signed)
Is it okay for patient to d/c the blood thinners for dental procedure?

## 2013-09-06 NOTE — ED Notes (Signed)
Patient states she developed a blood clot over 30 days ago after she had surgery.   Patient states ED put her on xarelto.   Patient states she did follow up with her primary doctor and they took her off xarelto and put her on lovenox.   Patient states she now needs to get two teeth extracted and her dentist will not pull the teeth until she gets release from blood thinners, etc.   She went to her primary who, in turn, sent her back to ED to get release.

## 2013-09-06 NOTE — ED Notes (Signed)
Pt is here to get a release to contact the MD that started patient on Xarelto.  Pt was told to come here to ensure the clot is gone.  Teeth pain.

## 2013-09-06 NOTE — Telephone Encounter (Signed)
Pt states shes on levinox and has to have 2 teeth pulled. Her dentist will not schedule until pt has something saying she can continue or needs to discontinue blood thinners prior to having them pulled.   If she needs to discontinue rx - how long prior to dental work? Needs it in writing for the dentist.   She doesn't remember name of the dentist but gave phone number to their office   Ph: [phone]  Please call pt  bf

## 2013-09-06 NOTE — ED Provider Notes (Signed)
CSN: [id number]     Arrival date & time 09/06/13  1147 History  This chart was scribed for non-physician practitioner Arthor Captain, PA-C, working with Dagmar Hait, MD by Dorothey Baseman, ED Scribe. This patient was seen in room TR09C/TR09C and the patient's care was started at 1:29 PM.    Chief Complaint  Patient presents with  . Dental Pain   The history is provided by the patient. No language interpreter was used.   HPI Comments: Sherry [name] is a 47 y.o. female who presents to the Emergency Department complaining of a constant pain to the left, lower dentition onset a few weeks ago. Patient was seen here on 08/26/2013 and 09/03/2013 for similar complaints and discharged with Penicillin and Percocet at the first encounter and discharged with a refill of her prescription for Percocet at the second encounter. Patient denies any new or worsening symptoms, but states that she is out of her pain medication. Patient was advised at both visits to follow up with a dentist for tooth extractions, but she states that the dentist will not perform the extractions without paperwork regarding her Lovenox use. Patient was advised to follow up with Dr. Perrin Maltese (who she states was the prescribing physician) at [facility] to obtain the necessary paperwork, but was told that Dr. Perrin Maltese would not be available until next week and the front desk advised to come to the ED. Patient reports that she could not remember if she saw Dr. Milus Glazier, who is listed as the prescribing physician. Patient has a history of blood clots in the neck following a reconstructive shoulder surgery.      Past Medical History  Diagnosis Date  . Vitamin D deficiency   . Gallstones   . Breast cancer 2010    s/p mastectomy  . Sinus infection     pt is on Doxycycline  . History of blood clots     neck-after reconstructive surgery  . Chronic back pain     bulding/herniated disc  . Hemorrhoids   .  Constipation     related to pain meds and takes stool softener prn  . Nocturia   . Diabetes mellitus     takes Saxagliptin-Metformin daily  . Lyme disease     taking Doxycycline-to finish up 06/03/12  . Frozen shoulder    Past Surgical History  Procedure Laterality Date  . Cesarean section  1992/1995  . Mastectomy  04/27/10    right  . Breast surgery    . Breast reconstruction  7/12  . Port a cath placed  2011  . Port a cath removed    . Lumbar laminectomy/decompression microdiscectomy  06/04/2012    Procedure: LUMBAR LAMINECTOMY/DECOMPRESSION MICRODISCECTOMY 1 LEVEL;  Surgeon: Venita Lick, MD;  Location: [facility] OR;  Service: Orthopedics;  Laterality: N/A;  L4-5 BILATERAL HEMILAMINECTOMY/MICRODISCECTOMY  . Back surgery    . Tubal ligation     Family History  Problem Relation Age of Onset  . Diabetes Mother   . Diabetes Sister    History  Substance Use Topics  . Smoking status: Current Every Day Smoker -- 1.00 packs/day for 28 years  . Smokeless tobacco: Not on file  . Alcohol Use: No   OB History   Grav Para Term Preterm Abortions TAB SAB Ect Mult Living                 Review of Systems  HENT: Positive for dental problem.     Allergies  Tramadol  Home Medications   Current Outpatient Rx  Name  Route  Sig  Dispense  Refill  . enoxaparin (LOVENOX) 120 MG/0.8ML injection   Subcutaneous   Inject 0.8 mLs (120 mg total) into the skin daily.   30 Syringe   5   . metFORMIN (GLUCOPHAGE) 500 MG tablet   Oral   Take 1 tablet (500 mg total) by mouth 2 (two) times daily with a meal.   60 tablet   3   . methocarbamol (ROBAXIN) 500 MG tablet   Oral   Take 1 tablet by mouth every 8 (eight) hours as needed for muscle spasms.          [name] oxyCODONE-acetaminophen (PERCOCET/ROXICET) 5-325 MG per tablet   Oral   Take 1 tablet by mouth every 6 (six) hours as needed for severe pain.   12 tablet   0   . penicillin v potassium (VEETID) 500 MG tablet   Oral   Take 1 tablet  (500 mg total) by mouth 3 (three) times daily.   30 tablet   0   . sitaGLIPtin (JANUVIA) 100 MG tablet   Oral   Take 1 tablet (100 mg total) by mouth daily.   90 tablet   3    Triage Vitals: BP 133/87  Pulse 84  Temp(Src) 99.1 F (37.3 C) (Oral)  Resp 18  Wt 145 lb (65.772 kg)  SpO2 98%  LMP 07/27/2013  Physical Exam  Nursing note and vitals reviewed. Constitutional: She is oriented to person, place, and time. She appears well-developed and well-nourished. No distress.  HENT:  Head: Normocephalic and atraumatic.  Eyes: Conjunctivae are normal.  Neck: Normal range of motion. Neck supple.  Pulmonary/Chest: Effort normal. No respiratory distress.  Abdominal: She exhibits no distension.  Musculoskeletal: Normal range of motion.  Neurological: She is alert and oriented to person, place, and time.  Skin: Skin is warm and dry.  Psychiatric: She has a normal mood and affect. Her behavior is normal.    ED Course  Procedures (including critical care time)  DIAGNOSTIC STUDIES: Oxygen Saturation is 98% on room air, normal by my interpretation.    COORDINATION OF CARE: 1:37 PM- Consulted with [facility]. Discussed that she will receive the paperwork regarding her Lovenox use for her dental surgery. Discussed treatment plan with patient at bedside and patient verbalized agreement.     Labs Review Labs Reviewed - No data to display Imaging Review No results found.  EKG Interpretation   None       MDM   1. Pain, dental    Patient seen here to be discontinued on Lovenox in order to have her teeth pulled.  I did call [facility].  Spoke with the office staff who have already faxed a letter of discontinuation for the dental procedure.  The patient is advised to discontinue Lovenox 24 hours before procedure.  She may restart Lovenox 12 hours after procedure.  Patient has pain medication at home.  She appears safe for  discharge at this time return precautions discussed. I personally performed the services described in this documentation, which was scribed in my presence. The recorded information has been reviewed and is accurate.       Arthor Captain, PA-C 09/06/13 1526

## 2013-09-06 NOTE — ED Notes (Signed)
Tried to discharge out of system.   Because the departure condition referred to [facility], it would not let me discharge patient out of the system.  Called Melissa, charge RN to discharge patient.

## 2013-09-06 NOTE — Telephone Encounter (Signed)
Patient came in to ask Dr. Perrin Maltese about release for dental procedure needed. Paperwork in Dr. Ernestene Mention box but he won't be back until Friday so Dr. Patsy Lager and Dr. Elbert Ewings are working on it as they have seen her before.

## 2013-09-09 NOTE — ED Provider Notes (Signed)
Medical screening examination/treatment/procedure(s) were performed by non-physician practitioner and as supervising physician I was immediately available for consultation/collaboration.  EKG Interpretation   None         Dagmar Hait, MD 09/09/13 1512

## 2013-09-11 ENCOUNTER — Emergency Department (HOSPITAL_COMMUNITY)
Admission: EM | Admit: 2013-09-11 | Discharge: 2013-09-11 | Disposition: A | Discharge status: Home / Self Care | Payer: Managed Care, Other (non HMO) | Attending: Emergency Medicine | Admitting: Emergency Medicine

## 2013-09-11 ENCOUNTER — Encounter (HOSPITAL_COMMUNITY): Payer: Self-pay | Admitting: Emergency Medicine

## 2013-09-11 DIAGNOSIS — Z8679 Personal history of other diseases of the circulatory system: Secondary | ICD-10-CM | POA: Insufficient documentation

## 2013-09-11 DIAGNOSIS — Z79899 Other long term (current) drug therapy: Secondary | ICD-10-CM | POA: Insufficient documentation

## 2013-09-11 DIAGNOSIS — E119 Type 2 diabetes mellitus without complications: Secondary | ICD-10-CM | POA: Insufficient documentation

## 2013-09-11 DIAGNOSIS — Z853 Personal history of malignant neoplasm of breast: Secondary | ICD-10-CM | POA: Insufficient documentation

## 2013-09-11 DIAGNOSIS — Z8739 Personal history of other diseases of the musculoskeletal system and connective tissue: Secondary | ICD-10-CM | POA: Insufficient documentation

## 2013-09-11 DIAGNOSIS — Z8619 Personal history of other infectious and parasitic diseases: Secondary | ICD-10-CM | POA: Insufficient documentation

## 2013-09-11 DIAGNOSIS — H9209 Otalgia, unspecified ear: Secondary | ICD-10-CM | POA: Insufficient documentation

## 2013-09-11 DIAGNOSIS — H9202 Otalgia, left ear: Secondary | ICD-10-CM

## 2013-09-11 DIAGNOSIS — Z8719 Personal history of other diseases of the digestive system: Secondary | ICD-10-CM | POA: Insufficient documentation

## 2013-09-11 DIAGNOSIS — Z8709 Personal history of other diseases of the respiratory system: Secondary | ICD-10-CM | POA: Insufficient documentation

## 2013-09-11 DIAGNOSIS — F17200 Nicotine dependence, unspecified, uncomplicated: Secondary | ICD-10-CM | POA: Insufficient documentation

## 2013-09-11 DIAGNOSIS — Z792 Long term (current) use of antibiotics: Secondary | ICD-10-CM | POA: Insufficient documentation

## 2013-09-11 DIAGNOSIS — G8929 Other chronic pain: Secondary | ICD-10-CM | POA: Insufficient documentation

## 2013-09-11 DIAGNOSIS — R220 Localized swelling, mass and lump, head: Secondary | ICD-10-CM | POA: Insufficient documentation

## 2013-09-11 MED ORDER — AMOXICILLIN 250 MG/5ML PO SUSR
500.0000 mg | Freq: Once | ORAL | Status: AC
  Administered 2013-09-11: 500 mg via ORAL
  Filled 2013-09-11: qty 10

## 2013-09-11 MED ORDER — HYDROCODONE-ACETAMINOPHEN 7.5-325 MG/15ML PO SOLN: 10.0000 mL | Freq: Four times a day (QID) | ORAL | Status: DC | PRN

## 2013-09-11 MED ORDER — CLINDAMYCIN HCL 150 MG PO CAPS: 150.0000 mg | ORAL_CAPSULE | Freq: Four times a day (QID) | ORAL | Status: DC

## 2013-09-11 MED ORDER — CLINDAMYCIN HCL 300 MG PO CAPS
300.0000 mg | ORAL_CAPSULE | Freq: Once | ORAL | Status: AC
  Administered 2013-09-11: 300 mg via ORAL
  Filled 2013-09-11: qty 1

## 2013-09-11 MED ORDER — ANTIPYRINE-BENZOCAINE 5.4-1.4 % OT SOLN
3.0000 [drp] | Freq: Once | OTIC | Status: AC
  Administered 2013-09-11: 4 [drp] via OTIC
  Filled 2013-09-11: qty 10

## 2013-09-11 MED ORDER — HYDROMORPHONE HCL PF 1 MG/ML IJ SOLN
1.0000 mg | Freq: Once | INTRAMUSCULAR | Status: AC
  Administered 2013-09-11: 1 mg via INTRAMUSCULAR
  Filled 2013-09-11: qty 1

## 2013-09-11 NOTE — ED Notes (Signed)
Pt. reports left ear ache onset yesterday with no drainage , denies injury or bleeding .

## 2013-09-13 NOTE — ED Provider Notes (Signed)
CSN: [id number]     Arrival date & time 09/11/13  4259 History   First MD Initiated Contact with Patient 09/11/13 [phone]     Chief Complaint  Patient presents with  . Otalgia   (Consider location/radiation/quality/duration/timing/severity/associated sxs/prior Treatment) HPI History provided by patient. Recent dental extraction, presents with left ear pain worse since just today and associated facial swelling in the area of dental extraction left lower molars.  She has been putting ice on this area of swelling has gone down. No fevers or chills. No difficulty breathing or swallowing. It does hurt to chew on that side. Pain is sharp in quality. No recent swimming or aquatics. No ear drainage. No change in hearing. Past Medical History  Diagnosis Date  . Vitamin D deficiency   . Gallstones   . Breast cancer 2010    s/p mastectomy  . Sinus infection     pt is on Doxycycline  . History of blood clots     neck-after reconstructive surgery  . Chronic back pain     bulding/herniated disc  . Hemorrhoids   . Constipation     related to pain meds and takes stool softener prn  . Nocturia   . Diabetes mellitus     takes Saxagliptin-Metformin daily  . Lyme disease     taking Doxycycline-to finish up 06/03/12  . Frozen shoulder    Past Surgical History  Procedure Laterality Date  . Cesarean section  1992/1995  . Mastectomy  04/27/10    right  . Breast surgery    . Breast reconstruction  7/12  . Port a cath placed  2011  . Port a cath removed    . Lumbar laminectomy/decompression microdiscectomy  06/04/2012    Procedure: LUMBAR LAMINECTOMY/DECOMPRESSION MICRODISCECTOMY 1 LEVEL;  Surgeon: Venita Lick, MD;  Location: [facility] OR;  Service: Orthopedics;  Laterality: N/A;  L4-5 BILATERAL HEMILAMINECTOMY/MICRODISCECTOMY  . Back surgery    . Tubal ligation     Family History  Problem Relation Age of Onset  . Diabetes Mother   . Diabetes Sister    History  Substance Use Topics  . Smoking  status: Current Every Day Smoker -- 1.00 packs/day for 28 years  . Smokeless tobacco: Not on file  . Alcohol Use: No   OB History   Grav Para Term Preterm Abortions TAB SAB Ect Mult Living                 Review of Systems  Constitutional: Negative for fever and chills.  HENT: Positive for ear pain and facial swelling. Negative for ear discharge, mouth sores and sore throat.   Eyes: Negative for discharge.  Respiratory: Negative for shortness of breath.   Cardiovascular: Negative for chest pain.  Gastrointestinal: Negative for abdominal pain.  Genitourinary: Negative for dysuria.  Musculoskeletal: Negative for back pain, neck pain and neck stiffness.  Skin: Negative for rash.  Neurological: Negative for headaches.  All other systems reviewed and are negative.    Allergies  Tramadol  Home Medications   Current Outpatient Rx  Name  Route  Sig  Dispense  Refill  . acetaminophen (TYLENOL) 500 MG tablet   Oral   Take 500-1,000 mg by mouth every 6 (six) hours as needed for mild pain, moderate pain, fever or headache.         . enoxaparin (LOVENOX) 120 MG/0.8ML injection   Subcutaneous   Inject 0.8 mLs (120 mg total) into the skin daily.   30 Syringe   5   .  metFORMIN (GLUCOPHAGE) 500 MG tablet   Oral   Take 1 tablet (500 mg total) by mouth 2 (two) times daily with a meal.   60 tablet   3   . methocarbamol (ROBAXIN) 500 MG tablet   Oral   Take 1 tablet by mouth every 8 (eight) hours as needed for muscle spasms.          . sitaGLIPtin (JANUVIA) 100 MG tablet   Oral   Take 1 tablet (100 mg total) by mouth daily.   90 tablet   3   . clindamycin (CLEOCIN) 150 MG capsule   Oral   Take 1 capsule (150 mg total) by mouth every 6 (six) hours.   28 capsule   0   . HYDROcodone-acetaminophen (HYCET) 7.5-325 mg/15 ml solution   Oral   Take 10 mLs by mouth every 6 (six) hours as needed for moderate pain or severe pain.   60 mL   0    BP 120/82  Pulse 83   Temp(Src) 98.7 F (37.1 C) (Oral)  Resp 16  SpO2 100%  LMP 08/28/2013 Physical Exam  Constitutional: She is oriented to person, place, and time. She appears well-developed and well-nourished.  HENT:  Head: Normocephalic.  No gingival swelling or fluctuance, mild trismus, tenderness left lower molars with minimal facial swelling in that area. No erythema. Left TM is clear. No posterior auricular tenderness. No ear drainage.  Eyes: EOM are normal. Pupils are equal, round, and reactive to light.  Neck: Neck supple.  Cardiovascular: Normal rate, regular rhythm and intact distal pulses.   Pulmonary/Chest: Effort normal and breath sounds normal. No respiratory distress.  Musculoskeletal: Normal range of motion. She exhibits no edema.  Neurological: She is alert and oriented to person, place, and time.  Skin: Skin is warm and dry.    ED Course  Procedures (including critical care time)  IV antibiotics. IV Dilaudid pain control Eardrops provided  Plan discharge home with prescription for antibiotics and pain medications as needed. Keep scheduled followup with dentist. Return precautions provided and verbalizes understood.  MDM   1. Otalgia of left ear    possible dental abscess  Medications provided. Nurse's notes and vital signs reviewed and considered.   Sunnie Nielsen, MD 09/13/13 [phone]

## 2013-09-14 DIAGNOSIS — Z0271 Encounter for disability determination: Secondary | ICD-10-CM

## 2013-09-25 ENCOUNTER — Emergency Department (HOSPITAL_COMMUNITY)
Admission: EM | Admit: 2013-09-25 | Discharge: 2013-09-26 | Disposition: A | Discharge status: Home / Self Care | Payer: Managed Care, Other (non HMO) | Attending: Emergency Medicine | Admitting: Emergency Medicine

## 2013-09-25 ENCOUNTER — Emergency Department (HOSPITAL_COMMUNITY): Payer: Managed Care, Other (non HMO)

## 2013-09-25 ENCOUNTER — Encounter (HOSPITAL_COMMUNITY): Payer: Self-pay | Admitting: Emergency Medicine

## 2013-09-25 DIAGNOSIS — F17200 Nicotine dependence, unspecified, uncomplicated: Secondary | ICD-10-CM | POA: Insufficient documentation

## 2013-09-25 DIAGNOSIS — Z8679 Personal history of other diseases of the circulatory system: Secondary | ICD-10-CM | POA: Insufficient documentation

## 2013-09-25 DIAGNOSIS — E119 Type 2 diabetes mellitus without complications: Secondary | ICD-10-CM | POA: Insufficient documentation

## 2013-09-25 DIAGNOSIS — R350 Frequency of micturition: Secondary | ICD-10-CM | POA: Insufficient documentation

## 2013-09-25 DIAGNOSIS — R34 Anuria and oliguria: Secondary | ICD-10-CM | POA: Insufficient documentation

## 2013-09-25 DIAGNOSIS — Z9851 Tubal ligation status: Secondary | ICD-10-CM | POA: Insufficient documentation

## 2013-09-25 DIAGNOSIS — Z8639 Personal history of other endocrine, nutritional and metabolic disease: Secondary | ICD-10-CM | POA: Insufficient documentation

## 2013-09-25 DIAGNOSIS — R109 Unspecified abdominal pain: Secondary | ICD-10-CM | POA: Insufficient documentation

## 2013-09-25 DIAGNOSIS — M545 Low back pain, unspecified: Secondary | ICD-10-CM | POA: Insufficient documentation

## 2013-09-25 DIAGNOSIS — R300 Dysuria: Secondary | ICD-10-CM | POA: Insufficient documentation

## 2013-09-25 DIAGNOSIS — Z86718 Personal history of other venous thrombosis and embolism: Secondary | ICD-10-CM | POA: Insufficient documentation

## 2013-09-25 DIAGNOSIS — Z8719 Personal history of other diseases of the digestive system: Secondary | ICD-10-CM | POA: Insufficient documentation

## 2013-09-25 DIAGNOSIS — Z9889 Other specified postprocedural states: Secondary | ICD-10-CM | POA: Insufficient documentation

## 2013-09-25 DIAGNOSIS — Z853 Personal history of malignant neoplasm of breast: Secondary | ICD-10-CM | POA: Insufficient documentation

## 2013-09-25 DIAGNOSIS — Z79899 Other long term (current) drug therapy: Secondary | ICD-10-CM | POA: Insufficient documentation

## 2013-09-25 DIAGNOSIS — Z8709 Personal history of other diseases of the respiratory system: Secondary | ICD-10-CM | POA: Insufficient documentation

## 2013-09-25 DIAGNOSIS — N898 Other specified noninflammatory disorders of vagina: Secondary | ICD-10-CM | POA: Insufficient documentation

## 2013-09-25 DIAGNOSIS — Z7901 Long term (current) use of anticoagulants: Secondary | ICD-10-CM | POA: Insufficient documentation

## 2013-09-25 DIAGNOSIS — G8929 Other chronic pain: Secondary | ICD-10-CM | POA: Insufficient documentation

## 2013-09-25 DIAGNOSIS — Z8619 Personal history of other infectious and parasitic diseases: Secondary | ICD-10-CM | POA: Insufficient documentation

## 2013-09-25 DIAGNOSIS — Z3202 Encounter for pregnancy test, result negative: Secondary | ICD-10-CM | POA: Insufficient documentation

## 2013-09-25 DIAGNOSIS — Z87440 Personal history of urinary (tract) infections: Secondary | ICD-10-CM | POA: Insufficient documentation

## 2013-09-25 LAB — URINALYSIS, ROUTINE W REFLEX MICROSCOPIC
Glucose, UA: >1000 mg/dL — AB
Ketones, ur: NEGATIVE mg/dL
Leukocytes, UA: NEGATIVE
Nitrite: NEGATIVE
Protein, ur: NEGATIVE mg/dL
Urobilinogen, UA: 1 mg/dL (ref 0.0–1.0)
pH: 6.5 (ref 5.0–8.0)

## 2013-09-25 LAB — CBC WITH DIFFERENTIAL/PLATELET
Basophils Absolute: 0 10*3/uL (ref 0.0–0.1)
Basophils Relative: 0 % (ref 0–1)
Eosinophils Absolute: 0.2 10*3/uL (ref 0.0–0.7)
Eosinophils Relative: 1 % (ref 0–5)
HCT: 40.1 % (ref 36.0–46.0)
Lymphs Abs: 4.1 10*3/uL — ABNORMAL HIGH (ref 0.7–4.0)
MCH: 32.2 pg (ref 26.0–34.0)
MCHC: 36.2 g/dL — ABNORMAL HIGH (ref 30.0–36.0)
Monocytes Absolute: 0.8 10*3/uL (ref 0.1–1.0)
Neutro Abs: 7.4 10*3/uL (ref 1.7–7.7)
Neutrophils Relative %: 59 % (ref 43–77)
RDW: 12.2 % (ref 11.5–15.5)

## 2013-09-25 LAB — WET PREP, GENITAL
Trich, Wet Prep: NONE SEEN
Yeast Wet Prep HPF POC: NONE SEEN

## 2013-09-25 LAB — URINE MICROSCOPIC-ADD ON

## 2013-09-25 LAB — BASIC METABOLIC PANEL
BUN: 8 mg/dL (ref 6–23)
CO2: 23 mEq/L (ref 19–32)
Calcium: 9.3 mg/dL (ref 8.4–10.5)
Chloride: 100 mEq/L (ref 96–112)
Creatinine, Ser: 0.33 mg/dL — ABNORMAL LOW (ref 0.50–1.10)
GFR calc Af Amer: >90 mL/min (ref >90)
GFR calc non Af Amer: >90 mL/min (ref >90)

## 2013-09-25 MED ORDER — IOHEXOL 300 MG/ML  SOLN
50.0000 mL | Freq: Once | INTRAMUSCULAR | Status: AC | PRN
  Administered 2013-09-25: 50 mL via ORAL

## 2013-09-25 MED ORDER — ACETAMINOPHEN 500 MG PO TABS
1000.0000 mg | ORAL_TABLET | Freq: Once | ORAL | Status: AC
  Administered 2013-09-25: 1000 mg via ORAL
  Filled 2013-09-25: qty 2

## 2013-09-25 MED ORDER — SODIUM CHLORIDE 0.9 % IV BOLUS (SEPSIS)
1000.0000 mL | Freq: Once | INTRAVENOUS | Status: AC
  Administered 2013-09-26: 1000 mL via INTRAVENOUS

## 2013-09-25 NOTE — ED Notes (Signed)
Pt arrived to ED with a complaint of lower abdominal pain.  Pt states pain began approx two days ago.  Pt states she is having difficulty urinating but is not experiencing painful urination.

## 2013-09-25 NOTE — ED Provider Notes (Signed)
CSN: [id number]     Arrival date & time 09/25/13  2113 History   First MD Initiated Contact with Patient 09/25/13 2147     Chief Complaint  Patient presents with  . Abdominal Pain   (Consider location/radiation/quality/duration/timing/severity/associated sxs/prior Treatment) HPI Comments: 47 year old female presents with 2 days of lower abdominal pain. States is in her mid, lower abdomen. States pain been gradually getting worse. She denies any new back pain. She has chronic low back pain has not worsened. Denies any fevers, nausea, vomiting, or diarrhea. States this feels like the last time she had a urinary tract infection. She's also noticed a new odor to her urine as well as having more frequent urination with less output. She denies any new vaginal symptoms it is bleeding or discharge. She states she had a normal period within the last month.     Past Medical History  Diagnosis Date  . Vitamin D deficiency   . Gallstones   . Breast cancer 2010    s/p mastectomy  . Sinus infection     pt is on Doxycycline  . History of blood clots     neck-after reconstructive surgery  . Chronic back pain     bulding/herniated disc  . Hemorrhoids   . Constipation     related to pain meds and takes stool softener prn  . Nocturia   . Diabetes mellitus     takes Saxagliptin-Metformin daily  . Lyme disease     taking Doxycycline-to finish up 06/03/12  . Frozen shoulder    Past Surgical History  Procedure Laterality Date  . Cesarean section  1992/1995  . Mastectomy  04/27/10    right  . Breast surgery    . Breast reconstruction  7/12  . Port a cath placed  2011  . Port a cath removed    . Lumbar laminectomy/decompression microdiscectomy  06/04/2012    Procedure: LUMBAR LAMINECTOMY/DECOMPRESSION MICRODISCECTOMY 1 LEVEL;  Surgeon: Venita Lick, MD;  Location: [facility];  Service: Orthopedics;  Laterality: N/A;  L4-5 BILATERAL HEMILAMINECTOMY/MICRODISCECTOMY  . Back surgery    . Tubal ligation      Family History  Problem Relation Age of Onset  . Diabetes Mother   . Diabetes Sister    History  Substance Use Topics  . Smoking status: Current Every Day Smoker -- 1.00 packs/day for 28 years  . Smokeless tobacco: Not on file  . Alcohol Use: No   OB History   Grav Para Term Preterm Abortions TAB SAB Ect Mult Living                 Review of Systems  Constitutional: Negative for fever and chills.  Gastrointestinal: Positive for abdominal pain. Negative for nausea, vomiting, diarrhea and constipation.  Genitourinary: Positive for dysuria, frequency and decreased urine volume. Negative for vaginal bleeding and vaginal discharge.  Musculoskeletal: Positive for back pain (chronic).  All other systems reviewed and are negative.    Allergies  Tramadol  Home Medications   Current Outpatient Rx  Name  Route  Sig  Dispense  Refill  . acetaminophen (TYLENOL) 500 MG tablet   Oral   Take 500-1,000 mg by mouth every 6 (six) hours as needed for mild pain, moderate pain, fever or headache.         . enoxaparin (LOVENOX) 120 MG/0.8ML injection   Subcutaneous   Inject 0.8 mLs (120 mg total) into the skin daily.   30 Syringe   5   . metFORMIN (GLUCOPHAGE)  500 MG tablet   Oral   Take 1 tablet (500 mg total) by mouth 2 (two) times daily with a meal.   60 tablet   3   . methocarbamol (ROBAXIN) 500 MG tablet   Oral   Take 1 tablet by mouth every 8 (eight) hours as needed for muscle spasms.          . sitaGLIPtin (JANUVIA) 100 MG tablet   Oral   Take 1 tablet (100 mg total) by mouth daily.   90 tablet   3    BP 128/88  Pulse 92  Temp(Src) 97.8 F (36.6 C) (Oral)  Resp 20  Ht 5\' 2"  (1.575 m)  Wt 145 lb (65.772 kg)  BMI 26.51 kg/m2  SpO2 96%  LMP 09/17/2013 Physical Exam  Nursing note and vitals reviewed. Constitutional: She is oriented to person, place, and time. She appears well-developed and well-nourished.  HENT:  Head: Normocephalic and atraumatic.  Right  Ear: External ear normal.  Left Ear: External ear normal.  Nose: Nose normal.  Eyes: Right eye exhibits no discharge. Left eye exhibits no discharge.  Cardiovascular: Normal rate, regular rhythm and normal heart sounds.   Pulmonary/Chest: Effort normal and breath sounds normal.  Abdominal: Soft. There is tenderness (no RLQ or LLQ tenderness) in the suprapubic area. There is no CVA tenderness.  Genitourinary: Uterus is not enlarged. Cervix exhibits no motion tenderness. No bleeding around the vagina. Vaginal discharge (scant) found.  Neurological: She is alert and oriented to person, place, and time.  Skin: Skin is warm and dry.    ED Course  Procedures (including critical care time) Labs Review Labs Reviewed  WET PREP, GENITAL - Abnormal; Notable for the following:    Clue Cells Wet Prep HPF POC RARE (*)    WBC, Wet Prep HPF POC FEW (*)    All other components within normal limits  CBC WITH DIFFERENTIAL - Abnormal; Notable for the following:    WBC 12.5 (*)    MCHC 36.2 (*)    Lymphs Abs 4.1 (*)    All other components within normal limits  BASIC METABOLIC PANEL - Abnormal; Notable for the following:    Potassium 3.4 (*)    Glucose, Bld 182 (*)    Creatinine, Ser 0.33 (*)    All other components within normal limits  URINE CULTURE  GC/CHLAMYDIA PROBE AMP  URINALYSIS, ROUTINE W REFLEX MICROSCOPIC  POCT PREGNANCY, URINE   Imaging Review No results found.  EKG Interpretation   None       MDM   1. Abdominal pain    Abdominal exam is mild tenderness in midline, no concern for appy given urinary sx and suprapubic tenderness. Afebrile, no vomiting, no CVA tenderness. No signs of pelvic pathology. Has rare clue cells, with minimal, likely normal discharge and no symptoms feel this is not BV that needs to be treated. UA negative for UTI, given no obvious cause for her abd pain will get CT to r/o acute surgical pathology such as appendicitis. Care transferred with CT scan and  disposition pending.    Audree Camel, MD 09/25/13 [phone]

## 2013-09-26 MED ORDER — IOHEXOL 300 MG/ML  SOLN
100.0000 mL | Freq: Once | INTRAMUSCULAR | Status: AC | PRN
  Administered 2013-09-26: 100 mL via INTRAVENOUS

## 2013-09-26 MED ORDER — ONDANSETRON HCL 4 MG/2ML IJ SOLN
4.0000 mg | Freq: Once | INTRAMUSCULAR | Status: AC
  Administered 2013-09-26: 4 mg via INTRAVENOUS
  Filled 2013-09-26: qty 2

## 2013-09-26 MED ORDER — MORPHINE SULFATE 4 MG/ML IJ SOLN
6.0000 mg | Freq: Once | INTRAMUSCULAR | Status: AC
  Administered 2013-09-26: 6 mg via INTRAVENOUS
  Filled 2013-09-26: qty 2

## 2013-09-26 MED ORDER — HYDROCODONE-ACETAMINOPHEN 5-325 MG PO TABS: 1.0000 | ORAL_TABLET | ORAL | Status: DC | PRN

## 2013-09-26 NOTE — ED Provider Notes (Signed)
Pt with no findings significant on CT except for complex fluid collection on the left side without pain on exam and pt has been doing lovenox shots and most likely the cause of collection.  O/w nothing to explain pain and may be musculoskeltal.  Given pain meds and to f/u with PCP early this week.  Gwyneth Sprout, MD 09/26/13 [phone]

## 2013-09-27 LAB — URINE CULTURE: Culture: NO GROWTH

## 2013-09-27 LAB — GC/CHLAMYDIA PROBE AMP
CT Probe RNA: NEGATIVE
GC Probe RNA: NEGATIVE

## 2013-10-18 ENCOUNTER — Encounter (HOSPITAL_COMMUNITY): Payer: Self-pay | Admitting: Emergency Medicine

## 2013-10-18 DIAGNOSIS — Z853 Personal history of malignant neoplasm of breast: Secondary | ICD-10-CM | POA: Insufficient documentation

## 2013-10-18 DIAGNOSIS — Z8679 Personal history of other diseases of the circulatory system: Secondary | ICD-10-CM | POA: Insufficient documentation

## 2013-10-18 DIAGNOSIS — Z86718 Personal history of other venous thrombosis and embolism: Secondary | ICD-10-CM | POA: Insufficient documentation

## 2013-10-18 DIAGNOSIS — Z8719 Personal history of other diseases of the digestive system: Secondary | ICD-10-CM | POA: Insufficient documentation

## 2013-10-18 DIAGNOSIS — J069 Acute upper respiratory infection, unspecified: Secondary | ICD-10-CM | POA: Insufficient documentation

## 2013-10-18 DIAGNOSIS — G8929 Other chronic pain: Secondary | ICD-10-CM | POA: Insufficient documentation

## 2013-10-18 DIAGNOSIS — Z8619 Personal history of other infectious and parasitic diseases: Secondary | ICD-10-CM | POA: Insufficient documentation

## 2013-10-18 DIAGNOSIS — H9209 Otalgia, unspecified ear: Secondary | ICD-10-CM | POA: Insufficient documentation

## 2013-10-18 DIAGNOSIS — E119 Type 2 diabetes mellitus without complications: Secondary | ICD-10-CM | POA: Insufficient documentation

## 2013-10-18 DIAGNOSIS — Z79899 Other long term (current) drug therapy: Secondary | ICD-10-CM | POA: Insufficient documentation

## 2013-10-18 DIAGNOSIS — F17200 Nicotine dependence, unspecified, uncomplicated: Secondary | ICD-10-CM | POA: Insufficient documentation

## 2013-10-18 DIAGNOSIS — Z8739 Personal history of other diseases of the musculoskeletal system and connective tissue: Secondary | ICD-10-CM | POA: Insufficient documentation

## 2013-10-18 DIAGNOSIS — H6120 Impacted cerumen, unspecified ear: Secondary | ICD-10-CM | POA: Insufficient documentation

## 2013-10-18 NOTE — ED Notes (Signed)
Pt in c/o left earache since earlier today, also congestion

## 2013-10-19 ENCOUNTER — Emergency Department (HOSPITAL_COMMUNITY)
Admission: EM | Admit: 2013-10-19 | Discharge: 2013-10-19 | Disposition: A | Discharge status: Home / Self Care | Payer: Managed Care, Other (non HMO) | Attending: Emergency Medicine | Admitting: Emergency Medicine

## 2013-10-19 DIAGNOSIS — H9202 Otalgia, left ear: Secondary | ICD-10-CM

## 2013-10-19 DIAGNOSIS — H6122 Impacted cerumen, left ear: Secondary | ICD-10-CM

## 2013-10-19 DIAGNOSIS — J069 Acute upper respiratory infection, unspecified: Secondary | ICD-10-CM

## 2013-10-19 MED ORDER — ANTIPYRINE-BENZOCAINE 5.4-1.4 % OT SOLN
3.0000 [drp] | Freq: Once | OTIC | Status: AC
  Administered 2013-10-19: 3 [drp] via OTIC
  Filled 2013-10-19: qty 10

## 2013-10-19 MED ORDER — PSEUDOEPHEDRINE HCL ER 120 MG PO TB12
120.0000 mg | ORAL_TABLET | Freq: Two times a day (BID) | ORAL | Status: DC
  Filled 2013-10-19: qty 1

## 2013-10-19 MED ORDER — PSEUDOEPHEDRINE HCL ER 120 MG PO TB12: 120.0000 mg | ORAL_TABLET | Freq: Two times a day (BID) | ORAL | Status: DC | PRN

## 2013-10-19 NOTE — ED Provider Notes (Signed)
Medical screening examination/treatment/procedure(s) were performed by non-physician practitioner and as supervising physician I was immediately available for consultation/collaboration.  EKG Interpretation   None        Merryl Hacker, MD 10/19/13 [phone]

## 2013-10-19 NOTE — ED Provider Notes (Signed)
CSN: [id number]     Arrival date & time 10/18/13  2253 History   First MD Initiated Contact with Patient 10/19/13 0013     Chief Complaint  Patient presents with  . Otalgia   (Consider location/radiation/quality/duration/timing/severity/associated sxs/prior Treatment) HPI Comments: Noted today congestion and L ear pain   Patient is a 48 y.o. female presenting with ear pain. The history is provided by the patient.  Otalgia Location:  Left Quality:  Aching Severity:  Moderate Onset quality:  Gradual Duration:  1 day Timing:  Constant Progression:  Worsening Chronicity:  New Context comment:  URI Relieved by:  None tried Worsened by:  Nothing tried Ineffective treatments:  None tried Associated symptoms: congestion and rhinorrhea   Associated symptoms: no ear discharge, no fever, no headaches, no neck pain and no sore throat   Congestion:    Location:  Nasal Rhinorrhea:    Severity:  Moderate   Duration:  1 day   Timing:  Constant   Progression:  Worsening   Past Medical History  Diagnosis Date  . Vitamin D deficiency   . Gallstones   . Breast cancer 2010    s/p mastectomy  . Sinus infection     pt is on Doxycycline  . History of blood clots     neck-after reconstructive surgery  . Chronic back pain     bulding/herniated disc  . Hemorrhoids   . Constipation     related to pain meds and takes stool softener prn  . Nocturia   . Diabetes mellitus     takes Saxagliptin-Metformin daily  . Lyme disease     taking Doxycycline-to finish up 06/03/12  . Frozen shoulder    Past Surgical History  Procedure Laterality Date  . Cesarean section  1992/1995  . Mastectomy  04/27/10    right  . Breast surgery    . Breast reconstruction  7/12  . Port a cath placed  2011  . Port a cath removed    . Lumbar laminectomy/decompression microdiscectomy  06/04/2012    Procedure: LUMBAR LAMINECTOMY/DECOMPRESSION MICRODISCECTOMY 1 LEVEL;  Surgeon: Melina Schools, MD;  Location: [place];   Service: Orthopedics;  Laterality: N/A;  L4-5 BILATERAL HEMILAMINECTOMY/MICRODISCECTOMY  . Back surgery    . Tubal ligation     Family History  Problem Relation Age of Onset  . Diabetes Mother   . Diabetes Sister    History  Substance Use Topics  . Smoking status: Current Every Day Smoker -- 1.00 packs/day for 28 years  . Smokeless tobacco: Not on file  . Alcohol Use: No   OB History   Grav Para Term Preterm Abortions TAB SAB Ect Mult Living                 Review of Systems  Constitutional: Negative for fever.  HENT: Positive for congestion, ear pain and rhinorrhea. Negative for ear discharge and sore throat.   Respiratory: Negative for shortness of breath.   Gastrointestinal: Negative for nausea.  Musculoskeletal: Negative for neck pain.  Neurological: Negative for dizziness and headaches.  All other systems reviewed and are negative.    Allergies  Tramadol  Home Medications   Current Outpatient Rx  Name  Route  Sig  Dispense  Refill  . acetaminophen (TYLENOL) 500 MG tablet   Oral   Take 500-1,000 mg by mouth every 6 (six) hours as needed for mild pain, moderate pain, fever or headache.         . cyclobenzaprine (FLEXERIL)  10 MG tablet   Oral   Take 10 mg by mouth 3 (three) times daily as needed for muscle spasms.         [name] enoxaparin (LOVENOX) 120 MG/0.8ML injection   Subcutaneous   Inject 0.8 mLs (120 mg total) into the skin daily.   30 Syringe   5   . metFORMIN (GLUCOPHAGE) 500 MG tablet   Oral   Take 1 tablet (500 mg total) by mouth 2 (two) times daily with a meal.   60 tablet   3   . sitaGLIPtin (JANUVIA) 100 MG tablet   Oral   Take 1 tablet (100 mg total) by mouth daily.   90 tablet   3   . pseudoephedrine (SUDAFED) 120 MG 12 hr tablet   Oral   Take 1 tablet (120 mg total) by mouth every 12 (twelve) hours as needed for congestion.   20 tablet   0    Pulse 98  Temp(Src) 97.9 F (36.6 C) (Oral)  Resp 20  Wt 145 lb (65.772 kg)  SpO2  99%  LMP 09/17/2013 Physical Exam  Nursing note and vitals reviewed. Constitutional: She is oriented to person, place, and time. She appears well-nourished.  HENT:  Head: Normocephalic.  Right Ear: External ear normal.  L ear cerumen impaction   Neck: Normal range of motion.  Cardiovascular: Normal rate.   Pulmonary/Chest: Effort normal and breath sounds normal. She exhibits no tenderness.  Lymphadenopathy:    She has no cervical adenopathy.  Neurological: She is alert and oriented to person, place, and time.  Skin: Skin is warm and dry. No rash noted. No pallor.    ED Course  Procedures (including critical care time) Labs Review Labs Reviewed - No data to display Imaging Review No results found.  EKG Interpretation   None       MDM   1. Otalgia of left ear   2. Cerumen impaction, left   3. URI, acute        Garald Balding, NP 10/19/13 0134

## 2013-10-19 NOTE — Discharge Instructions (Signed)
Cerumen Impaction A cerumen impaction is when the wax in your ear forms a plug. This plug usually causes reduced hearing. Sometimes it also causes an earache or dizziness. Removing a cerumen impaction can be difficult and painful. The wax sticks to the ear canal. The canal is sensitive and bleeds easily. If you try to remove a heavy wax buildup with a cotton tipped swab, you may push it in further. Irrigation with water, suction, and small ear curettes may be used to clear out the wax. If the impaction is fixed to the skin in the ear canal, ear drops may be needed for a few days to loosen the wax. People who build up a lot of wax frequently can use ear wax removal products available in your local drugstore. SEEK MEDICAL CARE IF:  You develop an earache, increased hearing loss, or marked dizziness. Document Released: 11/07/2004 Document Revised: 12/23/2011 Document Reviewed: 12/28/2009 [facility] Patient Information 2014 [place], [place]. You can use the provided drops for comfort for the next 1-2 days   3-4 drop into the left ear every 2-3 hours as needed. Take the decongestant as directed, drink plenty of water and rest

## 2013-10-24 ENCOUNTER — Encounter (HOSPITAL_COMMUNITY): Payer: Self-pay | Admitting: Emergency Medicine

## 2013-10-24 ENCOUNTER — Emergency Department (HOSPITAL_COMMUNITY): Payer: Managed Care, Other (non HMO)

## 2013-10-24 ENCOUNTER — Emergency Department (HOSPITAL_COMMUNITY)
Admission: EM | Admit: 2013-10-24 | Discharge: 2013-10-24 | Disposition: A | Discharge status: Home / Self Care | Payer: Managed Care, Other (non HMO) | Attending: Emergency Medicine | Admitting: Emergency Medicine

## 2013-10-24 DIAGNOSIS — J019 Acute sinusitis, unspecified: Secondary | ICD-10-CM | POA: Insufficient documentation

## 2013-10-24 DIAGNOSIS — E119 Type 2 diabetes mellitus without complications: Secondary | ICD-10-CM | POA: Insufficient documentation

## 2013-10-24 DIAGNOSIS — F17200 Nicotine dependence, unspecified, uncomplicated: Secondary | ICD-10-CM | POA: Insufficient documentation

## 2013-10-24 DIAGNOSIS — G8929 Other chronic pain: Secondary | ICD-10-CM | POA: Insufficient documentation

## 2013-10-24 DIAGNOSIS — H9203 Otalgia, bilateral: Secondary | ICD-10-CM

## 2013-10-24 DIAGNOSIS — K5900 Constipation, unspecified: Secondary | ICD-10-CM | POA: Insufficient documentation

## 2013-10-24 DIAGNOSIS — IMO0001 Reserved for inherently not codable concepts without codable children: Secondary | ICD-10-CM | POA: Insufficient documentation

## 2013-10-24 DIAGNOSIS — Z9010 Acquired absence of unspecified breast and nipple: Secondary | ICD-10-CM | POA: Insufficient documentation

## 2013-10-24 DIAGNOSIS — Z853 Personal history of malignant neoplasm of breast: Secondary | ICD-10-CM | POA: Insufficient documentation

## 2013-10-24 DIAGNOSIS — Z79899 Other long term (current) drug therapy: Secondary | ICD-10-CM | POA: Insufficient documentation

## 2013-10-24 DIAGNOSIS — Z8739 Personal history of other diseases of the musculoskeletal system and connective tissue: Secondary | ICD-10-CM | POA: Insufficient documentation

## 2013-10-24 DIAGNOSIS — Z7901 Long term (current) use of anticoagulants: Secondary | ICD-10-CM | POA: Insufficient documentation

## 2013-10-24 DIAGNOSIS — J40 Bronchitis, not specified as acute or chronic: Secondary | ICD-10-CM

## 2013-10-24 DIAGNOSIS — H9209 Otalgia, unspecified ear: Secondary | ICD-10-CM | POA: Insufficient documentation

## 2013-10-24 DIAGNOSIS — Z86718 Personal history of other venous thrombosis and embolism: Secondary | ICD-10-CM | POA: Insufficient documentation

## 2013-10-24 DIAGNOSIS — Z8619 Personal history of other infectious and parasitic diseases: Secondary | ICD-10-CM | POA: Insufficient documentation

## 2013-10-24 DIAGNOSIS — Z9889 Other specified postprocedural states: Secondary | ICD-10-CM | POA: Insufficient documentation

## 2013-10-24 MED ORDER — HYDROCODONE-ACETAMINOPHEN 5-325 MG PO TABS
1.0000 | ORAL_TABLET | Freq: Once | ORAL | Status: AC
  Administered 2013-10-24: 1 via ORAL
  Filled 2013-10-24: qty 1

## 2013-10-24 MED ORDER — IPRATROPIUM BROMIDE 0.03 % NA SOLN
2.0000 | Freq: Two times a day (BID) | NASAL | Status: DC
  Administered 2013-10-24: 2 via NASAL
  Filled 2013-10-24: qty 30

## 2013-10-24 MED ORDER — ANTIPYRINE-BENZOCAINE 5.4-1.4 % OT SOLN
3.0000 [drp] | Freq: Once | OTIC | Status: AC
  Administered 2013-10-24: 4 [drp] via OTIC
  Filled 2013-10-24: qty 10

## 2013-10-24 NOTE — ED Notes (Signed)
PT requesting medication for HA at this time

## 2013-10-24 NOTE — ED Provider Notes (Signed)
CSN: [id number]     Arrival date & time 10/24/13  1935 History  This chart was scribed for non-physician practitioner [name] working with Merryl Hacker, MD by Donato Schultz, ED Scribe. This patient was seen in room WTR7/WTR7 and the patient's care was started at 8:36 PM.     Chief Complaint  Patient presents with  . Influenza    Patient is a 48 y.o. female presenting with flu symptoms. The history is provided by the patient. No language interpreter was used.  Influenza Presenting symptoms: cough, headache, myalgias and sore throat   Presenting symptoms: no fever, no nausea and no shortness of breath   Cough:    Cough characteristics:  Non-productive   Severity:  Moderate   Onset quality:  Gradual   Duration:  5 days   Timing:  Intermittent   Progression:  Unchanged   Chronicity:  New Associated symptoms: ear pain (right side) and nasal congestion   Associated symptoms: no chills    HPI Comments: Sherry [name] is a 48 y.o. female who presents to the Emergency Department complaining of flu like symptoms that started three days ago.  The patient lists  Right sided otalgia, generalized myalgias, cough productive of thick sputum, sinus pressure, and congestion as associated symptoms.  She states she has taken Nyquil for her symptoms with no relief.  The patient was seen in the ED with the same symptoms three days ago.  Past Medical History  Diagnosis Date  . Vitamin D deficiency   . Gallstones   . Breast cancer 2010    s/p mastectomy  . Sinus infection     pt is on Doxycycline  . History of blood clots     neck-after reconstructive surgery  . Chronic back pain     bulding/herniated disc  . Hemorrhoids   . Constipation     related to pain meds and takes stool softener prn  . Nocturia   . Diabetes mellitus     takes Saxagliptin-Metformin daily  . Lyme disease     taking Doxycycline-to finish up 06/03/12  . Frozen shoulder    Past Surgical History  Procedure  Laterality Date  . Cesarean section  1992/1995  . Mastectomy  04/27/10    right  . Breast surgery    . Breast reconstruction  7/12  . Port a cath placed  2011  . Port a cath removed    . Lumbar laminectomy/decompression microdiscectomy  06/04/2012    Procedure: LUMBAR LAMINECTOMY/DECOMPRESSION MICRODISCECTOMY 1 LEVEL;  Surgeon: Melina Schools, MD;  Location: [place];  Service: Orthopedics;  Laterality: N/A;  L4-5 BILATERAL HEMILAMINECTOMY/MICRODISCECTOMY  . Back surgery    . Tubal ligation     Family History  Problem Relation Age of Onset  . Diabetes Mother   . Diabetes Sister    History  Substance Use Topics  . Smoking status: Current Every Day Smoker -- 1.00 packs/day for 28 years  . Smokeless tobacco: Not on file  . Alcohol Use: No   OB History   Grav Para Term Preterm Abortions TAB SAB Ect Mult Living                 Review of Systems  Constitutional: Negative for fever and chills.  HENT: Positive for congestion, ear pain (right side), sinus pressure and sore throat. Negative for ear discharge and facial swelling.   Respiratory: Positive for cough. Negative for shortness of breath.   Gastrointestinal: Negative for nausea.  Musculoskeletal: Positive  for myalgias.  Skin: Negative for rash and wound.  Neurological: Positive for headaches. Negative for dizziness.  All other systems reviewed and are negative.    Allergies  Tramadol  Home Medications   Current Outpatient Rx  Name  Route  Sig  Dispense  Refill  . acetaminophen (TYLENOL) 500 MG tablet   Oral   Take 500-1,000 mg by mouth every 6 (six) hours as needed for mild pain, moderate pain, fever or headache.         . enoxaparin (LOVENOX) 120 MG/0.8ML injection   Subcutaneous   Inject 0.8 mLs (120 mg total) into the skin daily.   30 Syringe   5   . guaiFENesin (MUCINEX) 600 MG 12 hr tablet   Oral   Take 1,200 mg by mouth 2 (two) times daily.         . metFORMIN (GLUCOPHAGE) 500 MG tablet   Oral   Take  1 tablet (500 mg total) by mouth 2 (two) times daily with a meal.   60 tablet   3   . Pseudoeph-Doxylamine-DM-APAP (NYQUIL PO)   Oral   Take 30 mLs by mouth at bedtime.         . sitaGLIPtin (JANUVIA) 100 MG tablet   Oral   Take 1 tablet (100 mg total) by mouth daily.   90 tablet   3    Triage Vitals: BP 146/91  Pulse 97  Temp(Src) 98 F (36.7 C) (Oral)  Resp 20  SpO2 98%  LMP 10/10/2013  Physical Exam  Nursing note and vitals reviewed. Constitutional: She is oriented to person, place, and time. She appears well-developed and well-nourished.  HENT:  Head: Normocephalic and atraumatic.  Right Ear: External ear normal.  Left Ear: External ear and ear canal normal.  Nose: Right sinus exhibits maxillary sinus tenderness and frontal sinus tenderness. Left sinus exhibits maxillary sinus tenderness and frontal sinus tenderness.  Right ear canal slightly reddened, tender to touch.  TM dull, bilaterally  Eyes: Conjunctivae and EOM are normal. Pupils are equal, round, and reactive to light.  Neck: Normal range of motion and phonation normal.  Cardiovascular: Normal rate.   Pulmonary/Chest: Effort normal and breath sounds normal. No respiratory distress. She has no wheezes. She has no rales.  Abdominal: Soft.  Musculoskeletal: Normal range of motion.  Lymphadenopathy:    She has no cervical adenopathy.  Neurological: She is alert and oriented to person, place, and time. She exhibits normal muscle tone.  Skin: Skin is warm and dry.  Psychiatric: She has a normal mood and affect. Her behavior is normal. Judgment and thought content normal.    ED Course  Procedures (including critical care time)  DIAGNOSTIC STUDIES: Oxygen Saturation is 98% on room air, normal by my interpretation.    COORDINATION OF CARE: 8:38 PM- Discussed obtaining a chest x-ray with the patient and the patient agreed to the treatment plan.    Labs Review Labs Reviewed - No data to display Imaging  Review No results found.  EKG Interpretation   None       MDM  No diagnosis found.  Patient has sinus congestion, tenderness to, palpation.  That's been going on for the last 3-4, days, without fever.  No significant mucous drainage.  She has a cough.  That is intermittent and persistent and is causing some discomfort in her upper airway.  While she is coughing.  She is not short of breath or tachypneic.  She has no significant respiratory history  she's been taking over-the-counter Mucinex and NyQuil with some relief.  Her for symptoms.  This is the second visit for the symptoms.  In 4, days. I still do not believe the patient needs antibiotics at this time.  Her chest x-ray shows bronchitic changes, but no infiltrate, or pneumonia. She has been given or [place] for arthralgia within 3-4 drops in each ear canal 2 hours.  For discomfort.  She's also been supplied with the Atrovent nasal inhaler to help with her nasal congestion  I personally performed the services described in this documentation, which was scribed in my presence. The recorded information has been reviewed and is accurate.   Garald Balding, NP 10/24/13 2107

## 2013-10-24 NOTE — ED Notes (Signed)
Pt arrived to the ED with a complaint of influenza.  Pt also is complaining of an ear ache.  Pt was seen in the ED for same a couple of days ago.  Pt was prescribed sudafed which she was not able to obtain so she used mucinex.  Pt states she has a productive cough that produces thick fleam.  Pt states she is congested and has body aches.

## 2013-10-24 NOTE — Discharge Instructions (Signed)
Bronchitis Bronchitis is swelling (inflammation) of the air tubes leading to your lungs (bronchi). This causes mucus and a cough. If the swelling gets bad, you may have trouble breathing. HOME CARE   Rest.  Drink enough fluids to keep your pee (urine) clear or pale yellow (unless you have a condition where you have to watch how much you drink).  Only take medicine as told by your doctor. If you were given antibiotic medicines, finish them even if you start to feel better.  Avoid smoke, irritating chemicals, and strong smells. These make the problem worse. Quit smoking if you smoke. This helps your lungs heal faster.  Use a cool mist humidifier. Change the water in the humidifier every day. You can also sit in the bathroom with hot shower running for 5 10 minutes. Keep the door closed.  See your health care provider as told.  Wash your hands often. GET HELP IF: Your problems do not get better after 1 week. GET HELP RIGHT AWAY IF:   Your fever gets worse.  You have chills.  Your chest hurts.  Your problems breathing get worse.  You have blood in your mucus.  You pass out (faint).  You feel lightheaded.  You have a bad headache.  You throw up (vomit) again and again. MAKE SURE YOU:  Understand these instructions.  Will watch your condition.  Will get help right away if you are not doing well or get worse. Document Released: 03/18/2008 Document Revised: 07/21/2013 Document Reviewed: 05/25/2013 [facility] Patient Information 2014 [place], [place]. You have been supplied several items.  While in the emergency department today.  Drops years called around you can put 3-4 drops in each ear canal every 2 hours as needed.  For discomfort The second item is an Atrovent nasal inhaler.  This will help with her nasal congestion 2 puffs to each nostril.  3 times a day for the next 5 days You can continue taking the medicines, that you're presently taking Tylenol for discomfort. Your  headache.  Will improve as the congestion, and decreases You can also try steam as follows.  Place, a teaspoon of Vicks VapoRub in any pole of water, that you've previously 48-year-old take a large bath towel leaned over.  This goal and read the papers until the water.  Clears.  This will open your sinuses, and allow them to drain, you can repeat this several times daily

## 2013-10-25 NOTE — ED Provider Notes (Signed)
Medical screening examination/treatment/procedure(s) were performed by non-physician practitioner and as supervising physician I was immediately available for consultation/collaboration.  EKG Interpretation   None         Lynelle Weiler F Brittiany Wiehe, MD 10/25/13 0023 

## 2013-10-26 ENCOUNTER — Ambulatory Visit (INDEPENDENT_AMBULATORY_CARE_PROVIDER_SITE_OTHER): Payer: Managed Care, Other (non HMO) | Admitting: Family Medicine

## 2013-10-26 VITALS — BP 128/84 | HR 92 | Temp 98.7°F | Resp 16 | Ht 62.0 in | Wt 144.6 lb

## 2013-10-26 DIAGNOSIS — M25512 Pain in left shoulder: Secondary | ICD-10-CM | POA: Insufficient documentation

## 2013-10-26 DIAGNOSIS — M25519 Pain in unspecified shoulder: Secondary | ICD-10-CM

## 2013-10-26 DIAGNOSIS — E119 Type 2 diabetes mellitus without complications: Secondary | ICD-10-CM

## 2013-10-26 DIAGNOSIS — J209 Acute bronchitis, unspecified: Secondary | ICD-10-CM

## 2013-10-26 DIAGNOSIS — I82409 Acute embolism and thrombosis of unspecified deep veins of unspecified lower extremity: Secondary | ICD-10-CM

## 2013-10-26 LAB — POCT CBC
GRANULOCYTE PERCENT: 58.6 % (ref 37–80)
HEMATOCRIT: 49.6 % — AB (ref 37.7–47.9)
HEMOGLOBIN: 16.1 g/dL (ref 12.2–16.2)
Lymph, poc: 3.4 (ref 0.6–3.4)
MCH, POC: 31.6 pg — AB (ref 27–31.2)
MCHC: 32.5 g/dL (ref 31.8–35.4)
MCV: 97.2 fL — AB (ref 80–97)
MID (cbc): 0.7 (ref 0–0.9)
MPV: 8.8 fL (ref 0–99.8)
POC Granulocyte: 5.9 (ref 2–6.9)
POC LYMPH PERCENT: 34.3 %L (ref 10–50)
POC MID %: 7.1 %M (ref 0–12)
Platelet Count, POC: 290 10*3/uL (ref 142–424)
RBC: 5.1 M/uL (ref 4.04–5.48)
RDW, POC: 12.2 %
WBC: 10 10*3/uL (ref 4.6–10.2)

## 2013-10-26 LAB — COMPREHENSIVE METABOLIC PANEL
ALBUMIN: 4.3 g/dL (ref 3.5–5.2)
ALT: 33 U/L (ref 0–35)
AST: 29 U/L (ref 0–37)
Alkaline Phosphatase: 134 U/L — ABNORMAL HIGH (ref 39–117)
BILIRUBIN TOTAL: 0.2 mg/dL — AB (ref 0.3–1.2)
BUN: 11 mg/dL (ref 6–23)
CHLORIDE: 100 meq/L (ref 96–112)
CO2: 22 meq/L (ref 19–32)
Calcium: 9.8 mg/dL (ref 8.4–10.5)
Creat: 0.42 mg/dL — ABNORMAL LOW (ref 0.50–1.10)
Glucose, Bld: 243 mg/dL — ABNORMAL HIGH (ref 70–99)
POTASSIUM: 4.1 meq/L (ref 3.5–5.3)
SODIUM: 134 meq/L — AB (ref 135–145)
Total Protein: 7.8 g/dL (ref 6.0–8.3)

## 2013-10-26 LAB — POCT GLYCOSYLATED HEMOGLOBIN (HGB A1C): HEMOGLOBIN A1C: 8.7

## 2013-10-26 MED ORDER — PREDNISONE 20 MG PO TABS: ORAL_TABLET | ORAL | Status: DC
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
## 2013-10-26 NOTE — Patient Instructions (Addendum)
Please call orthopedist if L shoulder pain is not improved.  Increase Metformin to 1 pill twice a day. Continue the Januvia.  Return in 2 weeks for flu shot. If the cough is not better on prednisone, please call to see if you need antibiotics.  Please follow up with Dr. Joseph Art in 3 months.

## 2013-10-28 ENCOUNTER — Telehealth: Payer: Self-pay | Admitting: Family Medicine

## 2013-10-28 ENCOUNTER — Encounter: Payer: Self-pay | Admitting: Family Medicine

## 2013-10-28 DIAGNOSIS — I82409 Acute embolism and thrombosis of unspecified deep veins of unspecified lower extremity: Secondary | ICD-10-CM

## 2013-10-28 NOTE — Progress Notes (Addendum)
History and physical exam obtained with Dr. Bridgett Larsson.  Patient reports the following: 1.  L shoulder pain:  Onset of pain this morning upon awaking; no overuse or injury in the past 48 hours.  No associated neck pain. No n/t/w of shoulder. Mild decreased ROM of shoulder.  Has not contacted ortho regarding recurrent pain; presented to [facility] for evaluation. S/p recent L shoulder surgery in past four months. 2. DMII: three month follow-up; no specific PCP at [facility]: sees whoever is available.  Restarted Metformin at last visit and taking 1/2 tablet bid with good tolerance; has suffered with loose stools in past.  Goal is to wean off of Januvia if tolerates higher dose of Metformin. No flu vaccine this year yet. Currently has a cold and reluctant to receive flu vaccine today.  Expects sugar to be high today; has not been eating well lately.   3.  R upper extremity DVT subclavian vein:  Maintained on Lovenox; occurred after surgical procedure.  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  No previous Coumadin rx.  No previous hematology consultation.  Lovenox started and managed by Dr. Joseph Art.  Worried that L shoulder pain may be DVT but has been compliant with daily Lovenox; current shoulder pain feels completely different from DVT pain.  4.  URI:  Onset in past week. No fever/chills/sweats.  +HA.  No ear pain.  +scratchy throat.  +rhinorrhea; +nasal congestion. +cough most significant symptom. No SOB; +scant sputum production.  +tobacco abuse.  O: VSS and noted in chart. GEN: WDWN female in NAD HEENT:  TM pearly gray; OP with mild erythema; +drainage.  +rhinorrhea B nares with swelling and erythema.   NECK: no cervical LAD. Supple; full ROM. CV: RRR; no m/g/r. LUNGS:  CTAB; no wheezing/rales/rhonchi; no tachypnea.   Extremities: No swelling BLE and LUE; radial pulse 2+ and equal B upper extremities; capillary refill < 3 seconds B hands. Musculoskeletal:  Full ROM cervical spine without pain or  limitation.  L shoulder with +TTP AC joint; full ROM L shoulder but slow; empty can negative; cross over testing negative; motor 5/5 BLE. A/P:  1. L shoulder pain: New; onset today; rx for Prednisone provided.  2.  DMII: uncontrolled; increase Metformin 500mg  to one tablet bid; continue current dose of Januvia for now. Monitor sugars closely while taking Prednisone taper.  Advised to pick one provider to see for DMII follow-up. 3.  URI: New.  Slowly improving; Prednisone should help symptoms; to call if no improvement after Prednisone. 4.  L upper extremity DVT: stable on Lovenxx; advised that needs to pick PCP to manage DVT and anticoagulation therapy. Upon review of chart, R upper extremity with acute DVT of subclavian vein. Recommend evaluation by hematology to rule out coagulopathy.  Also has history of breast cancer; thus, concern for recurrent malignancy. CT abdomen/pelvis in ED revealed complex fluid collection of unknown etiology 09/25/13; felt secondary to Lovenox injections.  5. RTC two weeks for flu vaccine.  Reginia Forts, M.D.  [facility] [address] [place], [place]  [address] [phone] phone [phone] fax

## 2013-10-28 NOTE — Addendum Note (Signed)
Addended by: Wardell Honour on: 10/28/2013 11:30 AM   Modules accepted: Orders

## 2013-10-28 NOTE — Telephone Encounter (Signed)
Please call patient --- after visit this week, I reviewed her chart in more detail.  Recommend referral to hematologist for further evaluation of recurrent DVT.  We will schedule appointment with hematologist in upcoming few weeks.

## 2013-10-29 ENCOUNTER — Telehealth: Payer: Self-pay | Admitting: Hematology and Oncology

## 2013-10-29 NOTE — Telephone Encounter (Signed)
C/D 10/29/13 for appt. 11/09/13

## 2013-10-29 NOTE — Telephone Encounter (Signed)
Spoke with pt, advised message from Dr. Smith. Pt understood. 

## 2013-10-29 NOTE — Telephone Encounter (Signed)
S/w pt and gve np appt 01/27 @ 9:30 w/Dr.Gorsuch Referring Dr. Reginia Forts Dx- DVT Welcome packet mailed.

## 2013-11-01 ENCOUNTER — Telehealth: Payer: Self-pay

## 2013-11-01 NOTE — Telephone Encounter (Signed)
PATIENT STATES SHE SAW DR. Tamala Julian ABOUT A WEEK AGO FOR SHOULDER PAIN. SHE PRESCRIBED HER PREDNISONE. IT SEEMS LIKE IT IS CAUSING HER BLOOD SUGARS TO GO HIGH. Sunday MORNING IT WAS 273. THIS MORNING (MONDAY), IT ITG 549. SHE ALSO IS GETTING LIGHT-HEADED. WHAT SHOULD SHE DO? BEST PHONE [phone] (CELL)  [place].  [place]

## 2013-11-01 NOTE — Telephone Encounter (Signed)
Recommend patient stopping Prednisone at this time due to elevated blood sugars.  Sugars should improve over the upcoming 72 hours after stopping Prednisone.

## 2013-11-01 NOTE — Telephone Encounter (Signed)
Dr. Tamala Julian would you like for pt to stop the prednisone or increase her meds until she finishes?  Please advise.

## 2013-11-02 NOTE — Telephone Encounter (Signed)
Left message for pt to rtn call

## 2013-11-02 NOTE — Progress Notes (Signed)
Left message for patient to call back to schedule appointment with Dr Joseph Art for 3 month f/u.

## 2013-11-04 ENCOUNTER — Telehealth: Payer: Self-pay

## 2013-11-04 NOTE — Telephone Encounter (Signed)
LMOM to CB. 

## 2013-11-04 NOTE — Telephone Encounter (Signed)
Patient calling requesting a refill on glucose strips for her diabetic machine Free Style Lite. She uses [facility] [address] she says she has already put in a request to pharmacy. Patient says she last saw Dr. Tamala Julian.  Pharmacy: [id number] [place], [place] - [place]  [phone]

## 2013-11-05 MED ORDER — GLUCOSE BLOOD VI STRP
ORAL_STRIP | Status: DC
Start: 2013-11-05 — End: 2014-01-27

## 2013-11-05 NOTE — Telephone Encounter (Signed)
LM for rtn call for prior message- Prednisone Sent in Rx for test strips

## 2013-11-05 NOTE — Telephone Encounter (Signed)
Please send in rx for Free Style Lite test strips; sig: check sugar once daily Disp: #100 refills PRN.  Dx: 250.02

## 2013-11-09 ENCOUNTER — Ambulatory Visit (HOSPITAL_BASED_OUTPATIENT_CLINIC_OR_DEPARTMENT_OTHER): Payer: Managed Care, Other (non HMO) | Admitting: Hematology and Oncology

## 2013-11-09 ENCOUNTER — Telehealth: Payer: Self-pay | Admitting: Hematology and Oncology

## 2013-11-09 ENCOUNTER — Ambulatory Visit: Payer: 59

## 2013-11-09 ENCOUNTER — Encounter: Payer: Self-pay | Admitting: Hematology and Oncology

## 2013-11-09 VITALS — BP 149/80 | HR 84 | Temp 98.3°F | Resp 20 | Ht 62.0 in | Wt 145.7 lb

## 2013-11-09 DIAGNOSIS — Z853 Personal history of malignant neoplasm of breast: Secondary | ICD-10-CM

## 2013-11-09 DIAGNOSIS — I82A11 Acute embolism and thrombosis of right axillary vein: Secondary | ICD-10-CM

## 2013-11-09 DIAGNOSIS — I82A19 Acute embolism and thrombosis of unspecified axillary vein: Secondary | ICD-10-CM

## 2013-11-09 DIAGNOSIS — C50919 Malignant neoplasm of unspecified site of unspecified female breast: Secondary | ICD-10-CM

## 2013-11-09 DIAGNOSIS — F17200 Nicotine dependence, unspecified, uncomplicated: Secondary | ICD-10-CM

## 2013-11-09 DIAGNOSIS — Z7901 Long term (current) use of anticoagulants: Secondary | ICD-10-CM

## 2013-11-09 HISTORY — DX: Acute embolism and thrombosis of right axillary vein: I82.A11

## 2013-11-09 NOTE — Telephone Encounter (Signed)
gv pt appt schedule for feb including doppler @ [facility] and mammo @ [facility]. pt aware she will need to call previous facility @ and have film sent to [facility] for comparison. pt has info for [facility].

## 2013-11-09 NOTE — Progress Notes (Signed)
Checked in new pt with no financial concerns. °
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
## 2013-11-23 ENCOUNTER — Other Ambulatory Visit: Payer: Self-pay

## 2013-11-23 DIAGNOSIS — E1110 Type 2 diabetes mellitus with ketoacidosis without coma: Secondary | ICD-10-CM

## 2013-11-23 DIAGNOSIS — M542 Cervicalgia: Secondary | ICD-10-CM

## 2013-11-23 DIAGNOSIS — E119 Type 2 diabetes mellitus without complications: Secondary | ICD-10-CM

## 2013-11-23 MED ORDER — METFORMIN HCL 500 MG PO TABS: 500.0000 mg | ORAL_TABLET | Freq: Two times a day (BID) | ORAL | Status: DC

## 2013-11-23 NOTE — Telephone Encounter (Signed)
req for 90 day supply. Resending RF.

## 2013-11-30 ENCOUNTER — Encounter (HOSPITAL_COMMUNITY): Payer: 59

## 2013-11-30 ENCOUNTER — Ambulatory Visit: Payer: Managed Care, Other (non HMO)

## 2013-12-01 ENCOUNTER — Emergency Department (HOSPITAL_COMMUNITY)
Admission: EM | Admit: 2013-12-01 | Discharge: 2013-12-01 | Disposition: A | Discharge status: Home / Self Care | Payer: Managed Care, Other (non HMO) | Attending: Emergency Medicine | Admitting: Emergency Medicine

## 2013-12-01 ENCOUNTER — Encounter (HOSPITAL_COMMUNITY): Payer: Self-pay | Admitting: Emergency Medicine

## 2013-12-01 DIAGNOSIS — Z792 Long term (current) use of antibiotics: Secondary | ICD-10-CM | POA: Insufficient documentation

## 2013-12-01 DIAGNOSIS — F17200 Nicotine dependence, unspecified, uncomplicated: Secondary | ICD-10-CM | POA: Insufficient documentation

## 2013-12-01 DIAGNOSIS — R519 Headache, unspecified: Secondary | ICD-10-CM

## 2013-12-01 DIAGNOSIS — Z853 Personal history of malignant neoplasm of breast: Secondary | ICD-10-CM | POA: Insufficient documentation

## 2013-12-01 DIAGNOSIS — R51 Headache: Secondary | ICD-10-CM | POA: Insufficient documentation

## 2013-12-01 DIAGNOSIS — H53149 Visual discomfort, unspecified: Secondary | ICD-10-CM | POA: Insufficient documentation

## 2013-12-01 DIAGNOSIS — E119 Type 2 diabetes mellitus without complications: Secondary | ICD-10-CM | POA: Insufficient documentation

## 2013-12-01 DIAGNOSIS — H9203 Otalgia, bilateral: Secondary | ICD-10-CM

## 2013-12-01 DIAGNOSIS — Z8739 Personal history of other diseases of the musculoskeletal system and connective tissue: Secondary | ICD-10-CM | POA: Insufficient documentation

## 2013-12-01 DIAGNOSIS — R5383 Other fatigue: Secondary | ICD-10-CM

## 2013-12-01 DIAGNOSIS — H9209 Otalgia, unspecified ear: Secondary | ICD-10-CM | POA: Insufficient documentation

## 2013-12-01 DIAGNOSIS — R5381 Other malaise: Secondary | ICD-10-CM | POA: Insufficient documentation

## 2013-12-01 DIAGNOSIS — G8929 Other chronic pain: Secondary | ICD-10-CM | POA: Insufficient documentation

## 2013-12-01 DIAGNOSIS — IMO0002 Reserved for concepts with insufficient information to code with codable children: Secondary | ICD-10-CM | POA: Insufficient documentation

## 2013-12-01 DIAGNOSIS — Z8619 Personal history of other infectious and parasitic diseases: Secondary | ICD-10-CM | POA: Insufficient documentation

## 2013-12-01 DIAGNOSIS — Z8719 Personal history of other diseases of the digestive system: Secondary | ICD-10-CM | POA: Insufficient documentation

## 2013-12-01 DIAGNOSIS — J019 Acute sinusitis, unspecified: Secondary | ICD-10-CM | POA: Insufficient documentation

## 2013-12-01 DIAGNOSIS — Z86718 Personal history of other venous thrombosis and embolism: Secondary | ICD-10-CM | POA: Insufficient documentation

## 2013-12-01 MED ORDER — OXYMETAZOLINE HCL 0.05 % NA SOLN
1.0000 | Freq: Once | NASAL | Status: AC
  Administered 2013-12-01: 1 via NASAL
  Filled 2013-12-01: qty 15

## 2013-12-01 MED ORDER — KETOROLAC TROMETHAMINE 60 MG/2ML IM SOLN
60.0000 mg | Freq: Once | INTRAMUSCULAR | Status: AC
  Administered 2013-12-01: 60 mg via INTRAMUSCULAR
  Filled 2013-12-01: qty 2

## 2013-12-01 MED ORDER — FLUTICASONE PROPIONATE 50 MCG/ACT NA SUSP
2.0000 | Freq: Every day | NASAL | Status: DC
Start: 2013-12-01 — End: 2014-01-27

## 2013-12-01 MED ORDER — AMOXICILLIN-POT CLAVULANATE 875-125 MG PO TABS: 1.0000 | ORAL_TABLET | Freq: Two times a day (BID) | ORAL | Status: DC

## 2013-12-01 MED ORDER — IBUPROFEN 600 MG PO TABS: 600.0000 mg | ORAL_TABLET | Freq: Four times a day (QID) | ORAL | Status: DC | PRN

## 2013-12-01 NOTE — ED Notes (Signed)
Patient reports fatigue and pain of head an ear for three days. Patient states, "I just feel bad all over, and the pain in my ear hurts to the point that it makes me cry." Productive cough reported with yellow sputum. Patient reports history of earaches.

## 2013-12-01 NOTE — ED Notes (Signed)
Pt reports headache x 3 days with left ear pain. Pain across forehead. No nausea/vomiting. Reports photosensitivity. No hx of migraines.

## 2013-12-01 NOTE — ED Provider Notes (Signed)
CSN: [id number]     Arrival date & time 12/01/13  1428 History   First MD Initiated Contact with Patient 12/01/13 1750     Chief Complaint  Patient presents with  . Headache  . Otalgia    (Consider location/radiation/quality/duration/timing/severity/associated sxs/prior Treatment) HPI Comments: Patient is 48 year old female who presents to the emergency department for headache x2 days. Patient states that headache has been gradually worsening since yesterday; she denies thunderclap onset. Headache pain is pressure-like and located at her bilateral temples. She states she has had mild relief from Tylenol taken prior to arrival. Symptoms associated with low-grade temperature of max 100.53F as well as b/l ear pressure (L>R), nasal congestion, sinus pressure, and mild photophobia. States husband sick with sinusitis at present. She denies neck pain/stiffness, difficulty speaking or swallowing, facial drooping, vision loss, tinnitus, nausea/vomiting, numbness/tingling, and extremity weakness.  Patient is a 48 y.o. female presenting with headaches and ear pain. The history is provided by the patient. No language interpreter was used.  Headache Associated symptoms: congestion, ear pain, fatigue, photophobia, sinus pressure and sore throat   Associated symptoms: no fever, no nausea and no vomiting   Otalgia Associated symptoms: congestion, headaches and sore throat   Associated symptoms: no fever, no tinnitus and no vomiting     Past Medical History  Diagnosis Date  . Vitamin D deficiency   . Gallstones   . Breast cancer 2010    s/p mastectomy  . Sinus infection     pt is on Doxycycline  . History of blood clots     neck-after reconstructive surgery  . Chronic back pain     bulding/herniated disc  . Hemorrhoids   . Constipation     related to pain meds and takes stool softener prn  . Nocturia   . Diabetes mellitus     takes Saxagliptin-Metformin daily  . Lyme disease     taking  Doxycycline-to finish up 06/03/12  . Frozen shoulder   . DVT (deep venous thrombosis) 08/13/2013    R subclavian vein RUE.  . DVT of right axillary vein, acute 11/09/2013   Past Surgical History  Procedure Laterality Date  . Cesarean section  1992/1995  . Mastectomy  04/27/10    right  . Breast surgery    . Breast reconstruction  7/12  . Port a cath placed  2011  . Port a cath removed    . Lumbar laminectomy/decompression microdiscectomy  06/04/2012    Procedure: LUMBAR LAMINECTOMY/DECOMPRESSION MICRODISCECTOMY 1 LEVEL;  Surgeon: Melina Schools, MD;  Location: [facility];  Service: Orthopedics;  Laterality: N/A;  L4-5 BILATERAL HEMILAMINECTOMY/MICRODISCECTOMY  . Back surgery    . Tubal ligation     Family History  Problem Relation Age of Onset  . Diabetes Mother   . Diabetes Sister    History  Substance Use Topics  . Smoking status: Current Every Day Smoker -- 1.00 packs/day for 28 years    Types: Cigarettes  . Smokeless tobacco: Never Used  . Alcohol Use: No   OB History   Grav Para Term Preterm Abortions TAB SAB Ect Mult Living                 Review of Systems  Constitutional: Positive for fatigue. Negative for fever.  HENT: Positive for congestion, ear pain, sinus pressure and sore throat. Negative for drooling, facial swelling, tinnitus and trouble swallowing.   Eyes: Positive for photophobia. Negative for visual disturbance.  Gastrointestinal: Negative for nausea and vomiting.  Neurological: Positive for headaches. Negative for syncope and speech difficulty.  All other systems reviewed and are negative.      Allergies  Tramadol  Home Medications   Current Outpatient Rx  Name  Route  Sig  Dispense  Refill  . acetaminophen (TYLENOL) 500 MG tablet   Oral   Take 500-1,000 mg by mouth every 6 (six) hours as needed for mild pain, moderate pain, fever or headache.         . enoxaparin (LOVENOX) 120 MG/0.8ML injection   Subcutaneous   Inject 0.8 mLs (120 mg total)  into the skin daily.   30 Syringe   5   . glucose blood (FREESTYLE LITE) test strip      Check sugars once daily   100 each   12   . metFORMIN (GLUCOPHAGE) 500 MG tablet   Oral   Take 1 tablet (500 mg total) by mouth 2 (two) times daily with a meal.   180 tablet   1   . sitaGLIPtin (JANUVIA) 100 MG tablet   Oral   Take 1 tablet (100 mg total) by mouth daily.   90 tablet   3   . amoxicillin-clavulanate (AUGMENTIN) 875-125 MG per tablet   Oral   Take 1 tablet by mouth 2 (two) times daily.   20 tablet   0   . fluticasone (FLONASE) 50 MCG/ACT nasal spray   Each Nare   Place 2 sprays into both nostrils daily.   16 g   0   . ibuprofen (ADVIL,MOTRIN) 600 MG tablet   Oral   Take 1 tablet (600 mg total) by mouth every 6 (six) hours as needed.   30 tablet   0    BP 113/77  Pulse 89  Temp(Src) 99.7 F (37.6 C) (Oral)  Resp 16  Wt 145 lb (65.772 kg)  SpO2 97%  Physical Exam  Nursing note and vitals reviewed. Constitutional: She is oriented to person, place, and time. She appears well-developed and well-nourished. No distress.  HENT:  Head: Normocephalic and atraumatic.  Right Ear: External ear and ear canal normal. No mastoid tenderness. Tympanic membrane is not perforated and not bulging. A middle ear effusion is present.  Left Ear: External ear and ear canal normal. No mastoid tenderness. Tympanic membrane is retracted. Tympanic membrane is not perforated and not bulging. A middle ear effusion is present.  Nose: Right sinus exhibits maxillary sinus tenderness and frontal sinus tenderness. Left sinus exhibits frontal sinus tenderness. Left sinus exhibits no maxillary sinus tenderness ("pressure only").  Mouth/Throat: Uvula is midline and mucous membranes are normal. Posterior oropharyngeal erythema present. No oropharyngeal exudate.  No mastoid swelling, erythema, or heat to touch bilaterally. B/l TMs dull without perforation. L TM retracted. Purulent appearing fluid  appreciated in middle ear.  Eyes: Conjunctivae and EOM are normal. Pupils are equal, round, and reactive to light. No scleral icterus.  Neck: Normal range of motion.  Cardiovascular: Normal rate, regular rhythm and normal heart sounds.   Pulmonary/Chest: Effort normal and breath sounds normal. No respiratory distress. She has no wheezes. She has no rales.  Musculoskeletal: Normal range of motion.  Neurological: She is alert and oriented to person, place, and time. She has normal reflexes. No cranial nerve deficit. She exhibits normal muscle tone.  GCS 15. Speech is goal oriented. No cranial nerve deficits appreciated; no facial drooping. No sensory deficits and patient has equal grip strength bilaterally with normal strength against resistance in all extremities. Patellar and Achilles  reflexes 2+. She moves extremities without ataxia.  Skin: Skin is warm and dry. No rash noted. She is not diaphoretic. No erythema. No pallor.  Psychiatric: She has a normal mood and affect. Her behavior is normal.    ED Course  Procedures (including critical care time) Labs Review Labs Reviewed - No data to display Imaging Review No results found.  EKG Interpretation   None       MDM   Final diagnoses:  Acute sinusitis  Otalgia of both ears  Sinus headache    48 year old female presents for headache x2 days with associated nasal congestion and bilateral otalgia. Headache without thunderclap onset. Patient will and nontoxic-appearing, hemodynamically stable, and afebrile today. Physical exam significant for nasal congestion with sinus tenderness. Patient also with purulent-appearing fluid in middle ear bilaterally with retracted TM on left. No tympanic membrane perforation appreciated. Neurologic exam today is nonfocal. She has no nuchal rigidity or meningismus. Do not believe that emergent imaging is indicated at this time.  Symptoms today suggestive of acute sinusitis with associated sinus headache.  Patient states that husband is sick with similar symptoms. She has been treated today with Toradol and Afrin with some improvement in her headache; however, believe that the majority of headache management will be achieved with treatment of sinusitis and congestion. Patient stable and appropriate for discharge with prescriptions for Augmentin, Flonase, and ibuprofen. Advised primary care followup and discussed return precautions. Patient agreeable to plan with no unaddressed concerns.   Filed Vitals:   12/01/13 1431 12/01/13 1742 12/01/13 1949  BP: 136/84 113/77 111/72  Pulse: 94 89 90  Temp: 97.5 F (36.4 C) 99.7 F (37.6 C) 99.1 F (37.3 C)  TempSrc: Oral Oral Oral  Resp: 17 16 18   Weight: 145 lb (65.772 kg)    SpO2: 98% 97% 95%       Antonietta Breach, PA-C 12/01/13 2022

## 2013-12-01 NOTE — Discharge Instructions (Signed)
Recommend you begin taking Augmentin as prescribed for sinusitis. Recommend Flonase for your nasal congestion. You may also use nasal saline sprays or sinus rinses for symptoms or any over-the-counter decongestants. Take ibuprofen for headache control. Drink plenty of fluids and get plenty of rest/sleep. Use salt water gargles for sore throat. Follow up with your primary care doctor. Return if symptoms worsen.  Sinusitis Sinusitis is redness, soreness, and swelling (inflammation) of the paranasal sinuses. Paranasal sinuses are air pockets within the bones of your face (beneath the eyes, the middle of the forehead, or above the eyes). In healthy paranasal sinuses, mucus is able to drain out, and air is able to circulate through them by way of your nose. However, when your paranasal sinuses are inflamed, mucus and air can become trapped. This can allow bacteria and other germs to grow and cause infection. Sinusitis can develop quickly and last only a short time (acute) or continue over a long period (chronic). Sinusitis that lasts for more than 12 weeks is considered chronic.  CAUSES  Causes of sinusitis include:  Allergies.  Structural abnormalities, such as displacement of the cartilage that separates your nostrils (deviated septum), which can decrease the air flow through your nose and sinuses and affect sinus drainage.  Functional abnormalities, such as when the small hairs (cilia) that line your sinuses and help remove mucus do not work properly or are not present. SYMPTOMS  Symptoms of acute and chronic sinusitis are the same. The primary symptoms are pain and pressure around the affected sinuses. Other symptoms include:  Upper toothache.  Earache.  Headache.  Bad breath.  Decreased sense of smell and taste.  A cough, which worsens when you are lying flat.  Fatigue.  Fever.  Thick drainage from your nose, which often is green and may contain pus (purulent).  Swelling and warmth  over the affected sinuses. DIAGNOSIS  Your caregiver will perform a physical exam. During the exam, your caregiver may:  Look in your nose for signs of abnormal growths in your nostrils (nasal polyps).  Tap over the affected sinus to check for signs of infection.  View the inside of your sinuses (endoscopy) with a special imaging device with a light attached (endoscope), which is inserted into your sinuses. If your caregiver suspects that you have chronic sinusitis, one or more of the following tests may be recommended:  Allergy tests.  Nasal culture A sample of mucus is taken from your nose and sent to a lab and screened for bacteria.  Nasal cytology A sample of mucus is taken from your nose and examined by your caregiver to determine if your sinusitis is related to an allergy. TREATMENT  Most cases of acute sinusitis are related to a viral infection and will resolve on their own within 10 days. Sometimes medicines are prescribed to help relieve symptoms (pain medicine, decongestants, nasal steroid sprays, or saline sprays).  However, for sinusitis related to a bacterial infection, your caregiver will prescribe antibiotic medicines. These are medicines that will help kill the bacteria causing the infection.  Rarely, sinusitis is caused by a fungal infection. In theses cases, your caregiver will prescribe antifungal medicine. For some cases of chronic sinusitis, surgery is needed. Generally, these are cases in which sinusitis recurs more than 3 times per year, despite other treatments. HOME CARE INSTRUCTIONS   Drink plenty of water. Water helps thin the mucus so your sinuses can drain more easily.  Use a humidifier.  Inhale steam 3 to 4 times a  day (for example, sit in the bathroom with the shower running).  Apply a warm, moist washcloth to your face 3 to 4 times a day, or as directed by your caregiver.  Use saline nasal sprays to help moisten and clean your sinuses.  Take  over-the-counter or prescription medicines for pain, discomfort, or fever only as directed by your caregiver. SEEK IMMEDIATE MEDICAL CARE IF:  You have increasing pain or severe headaches.  You have nausea, vomiting, or drowsiness.  You have swelling around your face.  You have vision problems.  You have a stiff neck.  You have difficulty breathing. MAKE SURE YOU:   Understand these instructions.  Will watch your condition.  Will get help right away if you are not doing well or get worse. Document Released: 09/30/2005 Document Revised: 12/23/2011 Document Reviewed: 10/15/2011 [facility] Patient Information 2014 [place], [place]. Sinus Headache A sinus headache happens when your sinuses become clogged or puffy (swollen). Sinus headaches can be mild or severe. HOME CARE  Take your medicines (antibiotics) as told. Finish them even if you start to feel better.  Only take medicine as told by your doctor.  Use a nose spray if you feel stuffed up (congested). GET HELP RIGHT AWAY IF:  You have a fever.  You have trouble seeing.  You suddenly have pain in your face or head.  You start to twitch or shake (seizure).  You are confused.  You get headaches more than once a week.  Light or sound bothers you.  You feel sick to your stomach (nauseous) or throw up (vomit).  Your headaches do not get better with treatment. MAKE SURE YOU:  Understand these instructions.  Will watch your condition.  Will get help right away if you are not doing well or get worse. Document Released: 01/30/2011 Document Revised: 12/23/2011 Document Reviewed: 01/30/2011 [facility] Patient Information 2014 [place], [place].

## 2013-12-02 ENCOUNTER — Telehealth: Payer: Self-pay

## 2013-12-02 NOTE — Telephone Encounter (Signed)
Called and left vm for patient to give [place] a call back to schedule a 3 month fu w/[place] in April

## 2013-12-03 NOTE — ED Provider Notes (Signed)
Medical screening examination/treatment/procedure(s) were performed by non-physician practitioner and as supervising physician I was immediately available for consultation/collaboration.  EKG Interpretation   None         Mervin Kung, MD 12/03/13 [phone]

## 2013-12-07 ENCOUNTER — Ambulatory Visit: Payer: Managed Care, Other (non HMO) | Admitting: Hematology and Oncology

## 2013-12-08 ENCOUNTER — Encounter: Payer: Self-pay | Admitting: *Deleted

## 2014-01-27 ENCOUNTER — Emergency Department (HOSPITAL_COMMUNITY): Payer: Managed Care, Other (non HMO)

## 2014-01-27 ENCOUNTER — Emergency Department (HOSPITAL_COMMUNITY)
Admission: EM | Admit: 2014-01-27 | Discharge: 2014-01-27 | Disposition: A | Discharge status: Home / Self Care | Payer: Managed Care, Other (non HMO) | Attending: Emergency Medicine | Admitting: Emergency Medicine

## 2014-01-27 ENCOUNTER — Encounter (HOSPITAL_COMMUNITY): Payer: Self-pay | Admitting: Emergency Medicine

## 2014-01-27 DIAGNOSIS — E119 Type 2 diabetes mellitus without complications: Secondary | ICD-10-CM | POA: Insufficient documentation

## 2014-01-27 DIAGNOSIS — R1012 Left upper quadrant pain: Secondary | ICD-10-CM | POA: Insufficient documentation

## 2014-01-27 DIAGNOSIS — F17200 Nicotine dependence, unspecified, uncomplicated: Secondary | ICD-10-CM | POA: Insufficient documentation

## 2014-01-27 DIAGNOSIS — R109 Unspecified abdominal pain: Secondary | ICD-10-CM

## 2014-01-27 DIAGNOSIS — Z86718 Personal history of other venous thrombosis and embolism: Secondary | ICD-10-CM | POA: Insufficient documentation

## 2014-01-27 DIAGNOSIS — Z9889 Other specified postprocedural states: Secondary | ICD-10-CM | POA: Insufficient documentation

## 2014-01-27 DIAGNOSIS — IMO0002 Reserved for concepts with insufficient information to code with codable children: Secondary | ICD-10-CM | POA: Insufficient documentation

## 2014-01-27 DIAGNOSIS — Z792 Long term (current) use of antibiotics: Secondary | ICD-10-CM | POA: Insufficient documentation

## 2014-01-27 DIAGNOSIS — Z853 Personal history of malignant neoplasm of breast: Secondary | ICD-10-CM | POA: Insufficient documentation

## 2014-01-27 DIAGNOSIS — Z8739 Personal history of other diseases of the musculoskeletal system and connective tissue: Secondary | ICD-10-CM | POA: Insufficient documentation

## 2014-01-27 DIAGNOSIS — Z3202 Encounter for pregnancy test, result negative: Secondary | ICD-10-CM | POA: Insufficient documentation

## 2014-01-27 DIAGNOSIS — G8929 Other chronic pain: Secondary | ICD-10-CM | POA: Insufficient documentation

## 2014-01-27 DIAGNOSIS — Z8709 Personal history of other diseases of the respiratory system: Secondary | ICD-10-CM | POA: Insufficient documentation

## 2014-01-27 DIAGNOSIS — Z8619 Personal history of other infectious and parasitic diseases: Secondary | ICD-10-CM | POA: Insufficient documentation

## 2014-01-27 DIAGNOSIS — R1032 Left lower quadrant pain: Secondary | ICD-10-CM | POA: Insufficient documentation

## 2014-01-27 DIAGNOSIS — Z79899 Other long term (current) drug therapy: Secondary | ICD-10-CM | POA: Insufficient documentation

## 2014-01-27 DIAGNOSIS — Z8719 Personal history of other diseases of the digestive system: Secondary | ICD-10-CM | POA: Insufficient documentation

## 2014-01-27 DIAGNOSIS — Z9851 Tubal ligation status: Secondary | ICD-10-CM | POA: Insufficient documentation

## 2014-01-27 LAB — COMPREHENSIVE METABOLIC PANEL
ALT: 22 U/L (ref 0–35)
AST: 20 U/L (ref 0–37)
Albumin: 3.7 g/dL (ref 3.5–5.2)
Alkaline Phosphatase: 163 U/L — ABNORMAL HIGH (ref 39–117)
BUN: 12 mg/dL (ref 6–23)
CO2: 21 mEq/L (ref 19–32)
Calcium: 9.1 mg/dL (ref 8.4–10.5)
Chloride: 100 mEq/L (ref 96–112)
Creatinine, Ser: 0.29 mg/dL — ABNORMAL LOW (ref 0.50–1.10)
GFR calc Af Amer: >90 mL/min (ref >90)
GFR calc non Af Amer: >90 mL/min (ref >90)
Glucose, Bld: 312 mg/dL — ABNORMAL HIGH (ref 70–99)
Potassium: 4.1 mEq/L (ref 3.7–5.3)
SODIUM: 136 meq/L — AB (ref 137–147)
TOTAL PROTEIN: 7.7 g/dL (ref 6.0–8.3)
Total Bilirubin: 0.2 mg/dL — ABNORMAL LOW (ref 0.3–1.2)

## 2014-01-27 LAB — URINALYSIS, ROUTINE W REFLEX MICROSCOPIC
Bilirubin Urine: NEGATIVE
HGB URINE DIPSTICK: NEGATIVE
Ketones, ur: NEGATIVE mg/dL
Leukocytes, UA: NEGATIVE
Nitrite: NEGATIVE
PH: 5.5 (ref 5.0–8.0)
Protein, ur: NEGATIVE mg/dL
Urobilinogen, UA: 0.2 mg/dL (ref 0.0–1.0)

## 2014-01-27 LAB — CBC WITH DIFFERENTIAL/PLATELET
BASOS ABS: 0.1 10*3/uL (ref 0.0–0.1)
Basophils Relative: 0 % (ref 0–1)
EOS ABS: 0.2 10*3/uL (ref 0.0–0.7)
EOS PCT: 2 % (ref 0–5)
HCT: 42.8 % (ref 36.0–46.0)
Hemoglobin: 15.5 g/dL — ABNORMAL HIGH (ref 12.0–15.0)
LYMPHS PCT: 30 % (ref 12–46)
Lymphs Abs: 3.7 10*3/uL (ref 0.7–4.0)
MCH: 32.3 pg (ref 26.0–34.0)
MCHC: 36.2 g/dL — ABNORMAL HIGH (ref 30.0–36.0)
MCV: 89.2 fL (ref 78.0–100.0)
Monocytes Absolute: 0.8 10*3/uL (ref 0.1–1.0)
Monocytes Relative: 7 % (ref 3–12)
NEUTROS PCT: 60 % (ref 43–77)
Neutro Abs: 7.3 10*3/uL (ref 1.7–7.7)
PLATELETS: 225 10*3/uL (ref 150–400)
RBC: 4.8 MIL/uL (ref 3.87–5.11)
RDW: 12.6 % (ref 11.5–15.5)
WBC: 12.1 10*3/uL — ABNORMAL HIGH (ref 4.0–10.5)

## 2014-01-27 LAB — POC URINE PREG, ED: PREG TEST UR: NEGATIVE

## 2014-01-27 LAB — URINE MICROSCOPIC-ADD ON

## 2014-01-27 LAB — LIPASE, BLOOD: Lipase: 32 U/L (ref 11–59)

## 2014-01-27 MED ORDER — ONDANSETRON 8 MG PO TBDP: ORAL_TABLET | ORAL | Status: DC

## 2014-01-27 MED ORDER — MORPHINE SULFATE 4 MG/ML IJ SOLN
4.0000 mg | Freq: Once | INTRAMUSCULAR | Status: AC
  Administered 2014-01-27: 4 mg via INTRAVENOUS
  Filled 2014-01-27: qty 1

## 2014-01-27 MED ORDER — OXYCODONE-ACETAMINOPHEN 5-325 MG PO TABS: 2.0000 | ORAL_TABLET | ORAL | Status: DC | PRN

## 2014-01-27 MED ORDER — IOHEXOL 300 MG/ML  SOLN
100.0000 mL | Freq: Once | INTRAMUSCULAR | Status: AC | PRN
  Administered 2014-01-27: 100 mL via INTRAVENOUS

## 2014-01-27 MED ORDER — SODIUM CHLORIDE 0.9 % IV BOLUS (SEPSIS)
2000.0000 mL | Freq: Once | INTRAVENOUS | Status: AC
  Administered 2014-01-27: 1000 mL via INTRAVENOUS

## 2014-01-27 MED ORDER — IOHEXOL 300 MG/ML  SOLN
50.0000 mL | Freq: Once | INTRAMUSCULAR | Status: AC | PRN
  Administered 2014-01-27: 50 mL via ORAL

## 2014-01-27 MED ORDER — MORPHINE SULFATE 4 MG/ML IJ SOLN
6.0000 mg | Freq: Once | INTRAMUSCULAR | Status: AC
  Administered 2014-01-27: 6 mg via INTRAVENOUS
  Filled 2014-01-27: qty 2

## 2014-01-27 NOTE — Discharge Instructions (Signed)
Clear liquid diet until recheck. Abdominal (belly) pain can be caused by many things. Your caregiver performed an examination and possibly ordered blood/urine tests and imaging (CT scan, x-rays, ultrasound). Many cases can be observed and treated at home after initial evaluation in the emergency department. Even though you are being discharged home, abdominal pain can be unpredictable. Therefore, you need a repeated exam if your pain does not resolve, returns, or worsens. Most patients with abdominal pain don't have to be admitted to the hospital or have surgery, but serious problems like appendicitis and gallbladder attacks can start out as nonspecific pain. Many abdominal conditions cannot be diagnosed in one visit, so follow-up evaluations are very important. SEEK IMMEDIATE MEDICAL ATTENTION IF: The pain does not go away or becomes severe.  A temperature above 101 develops.  Repeated vomiting occurs (multiple episodes).  The pain becomes localized to portions of the abdomen. The right side could possibly be appendicitis. In an adult, the left lower portion of the abdomen could be colitis or diverticulitis.  Blood is being passed in stools or vomit (bright red or black tarry stools).  Return also if you develop chest pain, difficulty breathing, dizziness or fainting, or become confused, poorly responsive, or inconsolable (young children).

## 2014-01-27 NOTE — ED Provider Notes (Signed)
CSN: [id number]     Arrival date & time 01/27/14  19 History   First MD Initiated Contact with Patient 01/27/14 1618     Chief Complaint  Patient presents with  . Abdominal Pain     (Consider location/radiation/quality/duration/timing/severity/associated sxs/prior Treatment) HPI 48 year old female with history of breast cancer, diabetes, DVT, prior C-sections twice and tubal ligation, presents with 12-18 hours gradual onset left-sided abdominal pain with no associated symptoms, pain is worse with movement and palpation gradually worsening now moderately severe started out mild, she is no associated symptoms no radiation no colicky pain no trauma no rash, she is no nausea vomiting diarrhea no dysuria no hematuria no vaginal bleeding no vaginal discharge and no treatment prior to arrival. She is no fever cough chest pain or shortness of breath. Her pain is mostly localized to the left lower quadrant although she has some mild left upper quadrant pain as well. She does not have a history of diverticulitis has not had pain like this in the past. Past Medical History  Diagnosis Date  . Vitamin D deficiency   . Gallstones   . Breast cancer 2010    s/p mastectomy  . Sinus infection     pt is on Doxycycline  . History of blood clots     neck-after reconstructive surgery  . Chronic back pain     bulding/herniated disc  . Hemorrhoids   . Constipation     related to pain meds and takes stool softener prn  . Nocturia   . Diabetes mellitus     takes Saxagliptin-Metformin daily  . Lyme disease     taking Doxycycline-to finish up 06/03/12  . Frozen shoulder   . DVT (deep venous thrombosis) 08/13/2013    R subclavian vein RUE.  . DVT of right axillary vein, acute 11/09/2013   Past Surgical History  Procedure Laterality Date  . Cesarean section  1992/1995  . Mastectomy  04/27/10    right  . Breast surgery    . Breast reconstruction  7/12  . Port a cath placed  2011  . Port a cath removed     . Lumbar laminectomy/decompression microdiscectomy  06/04/2012    Procedure: LUMBAR LAMINECTOMY/DECOMPRESSION MICRODISCECTOMY 1 LEVEL;  Surgeon: Melina Schools, MD;  Location: [facility];  Service: Orthopedics;  Laterality: N/A;  L4-5 BILATERAL HEMILAMINECTOMY/MICRODISCECTOMY  . Back surgery    . Tubal ligation     Family History  Problem Relation Age of Onset  . Diabetes Mother   . Diabetes Sister    History  Substance Use Topics  . Smoking status: Current Every Day Smoker -- 1.00 packs/day for 28 years    Types: Cigarettes  . Smokeless tobacco: Never Used  . Alcohol Use: No   OB History   Grav Para Term Preterm Abortions TAB SAB Ect Mult Living                 Review of Systems 10 Systems reviewed and are negative for acute change except as noted in the HPI.   Allergies  Tramadol  Home Medications   Prior to Admission medications   Medication Sig Start Date End Date Taking? Authorizing Provider  acetaminophen (TYLENOL) 500 MG tablet Take 500-1,000 mg by mouth every 6 (six) hours as needed for mild pain, moderate pain, fever or headache.    Historical Provider, MD  amoxicillin-clavulanate (AUGMENTIN) 875-125 MG per tablet Take 1 tablet by mouth 2 (two) times daily. 12/01/13   Antonietta Breach, PA-C  enoxaparin (  LOVENOX) 120 MG/0.8ML injection Inject 0.8 mLs (120 mg total) into the skin daily. 08/15/13   Robyn Haber, MD  fluticasone (FLONASE) 50 MCG/ACT nasal spray Place 2 sprays into both nostrils daily. 12/01/13   Antonietta Breach, PA-C  ibuprofen (ADVIL,MOTRIN) 600 MG tablet Take 1 tablet (600 mg total) by mouth every 6 (six) hours as needed. 12/01/13   Antonietta Breach, PA-C  metFORMIN (GLUCOPHAGE) 500 MG tablet Take 1 tablet (500 mg total) by mouth 2 (two) times daily with a meal. 11/23/13   Orma Flaming, MD  methocarbamol (ROBAXIN) 500 MG tablet Take 500 mg by mouth.    Historical Provider, MD  sitaGLIPtin (JANUVIA) 100 MG tablet Take 1 tablet (100 mg total) by mouth daily. 08/13/13   Orma Flaming, MD   BP 118/76  Pulse 79  Temp(Src) 98.6 F (37 C) (Oral)  Resp 18  SpO2 97%  LMP 11/10/2013 Physical Exam  Nursing note and vitals reviewed. Constitutional:  Awake, alert, nontoxic appearance.  HENT:  Head: Atraumatic.  Eyes: Right eye exhibits no discharge. Left eye exhibits no discharge.  Neck: Neck supple.  Cardiovascular: Normal rate and regular rhythm.   No murmur heard. Pulmonary/Chest: Effort normal and breath sounds normal. No respiratory distress. She has no wheezes. She has no rales. She exhibits no tenderness.  Abdominal: Soft. Bowel sounds are normal. She exhibits no distension and no mass. There is tenderness. There is guarding. There is no rebound.  Moderate tenderness left lower quadrant mild tenderness left upper quadrant no tenderness the right side of the abdomen no rebound  Genitourinary:  No CVA tenderness; chaperone present for bimanual examination no cervical motion tenderness no adnexal tenderness no palpable adnexal masses; no bleeding or discharge noted on examination glove  Musculoskeletal: She exhibits no tenderness.  Baseline ROM, no obvious new focal weakness.  Neurological: She is alert.  Mental status and motor strength appears baseline for patient and situation.  Skin: No rash noted.  Psychiatric: She has a normal mood and affect.    ED Course  Procedures (including critical care time) Patient / Family / Caregiver understand and agree with initial ED impression and plan with expectations set for ED visit. Pt stable in ED with no significant deterioration in condition.Patient / Family / Caregiver informed of clinical course, understand medical decision-making process, and agree with plan.Mild LLQ tenderness only, doubt cholecystitis. Labs Review Labs Reviewed  CBC WITH DIFFERENTIAL - Abnormal; Notable for the following:    WBC 12.1 (*)    Hemoglobin 15.5 (*)    MCHC 36.2 (*)    All other components within normal limits  COMPREHENSIVE  METABOLIC PANEL - Abnormal; Notable for the following:    Sodium 136 (*)    Glucose, Bld 312 (*)    Creatinine, Ser 0.29 (*)    Alkaline Phosphatase 163 (*)    Total Bilirubin 0.2 (*)    All other components within normal limits  URINALYSIS, ROUTINE W REFLEX MICROSCOPIC - Abnormal; Notable for the following:    Specific Gravity, Urine >1.046 (*)    Glucose, UA >1000 (*)    All other components within normal limits  LIPASE, BLOOD  URINE MICROSCOPIC-ADD ON  POC URINE PREG, ED    Imaging Review Ct Abdomen Pelvis W Contrast  01/27/2014   CLINICAL DATA:  Left lower quadrant pain  EXAM: CT ABDOMEN AND PELVIS WITH CONTRAST  TECHNIQUE: Multidetector CT imaging of the abdomen and pelvis was performed using the standard protocol following bolus administration of  intravenous contrast.  CONTRAST:  29mL OMNIPAQUE IOHEXOL 300 MG/ML SOLN, 117mL OMNIPAQUE IOHEXOL 300 MG/ML SOLN  COMPARISON:  CT ABD/PELVIS W CM dated 09/26/2013; DG CHEST 2 VIEW dated 10/24/2013  FINDINGS: Right breast implant.  Cholelithiasis  Diffuse hepatic steatosis. Stable liver hypodensities picture likely benign.  Spleen, pancreas, adrenal glands are within normal limits. Stable and unremarkable kidneys.  Normal appendix.  Prominent stool burden throughout the colon.  Myometrium of the uterus is heterogeneous. Multiple fibroids or adenomyosis are not excluded. The endometrial stripe is thickened in the lower uterine segment.  No free-fluid.  No abnormal retroperitoneal adenopathy.  Nonspecific small soft tissue densities in the lower abdominal subcutaneous fat are smaller than on the prior study.  IMPRESSION: Cholelithiasis.  Stable liver hypodensities which are likely benign cysts.  Abnormal appearance of the uterus as described. Pelvic ultrasound may be helpful.   Electronically Signed   By: Maryclare Bean M.D.   On: 01/27/2014 18:51   EKG Interpretation None      MDM   Final diagnoses:  Abdominal pain    I doubt any other EMC  precluding discharge at this time including, but not necessarily limited to the following:SBI, peritonitis.    Babette Relic, MD 01/31/14 2220

## 2014-01-27 NOTE — ED Notes (Signed)
Pt c/o lower left side abd pain since last night. Denies n/v/d.

## 2014-02-03 ENCOUNTER — Emergency Department (INDEPENDENT_AMBULATORY_CARE_PROVIDER_SITE_OTHER)
Admission: EM | Admit: 2014-02-03 | Discharge: 2014-02-03 | Disposition: A | Discharge status: Home / Self Care | Payer: Managed Care, Other (non HMO) | Source: Home / Self Care | Attending: Family Medicine | Admitting: Family Medicine

## 2014-02-03 ENCOUNTER — Encounter (HOSPITAL_COMMUNITY): Payer: Self-pay | Admitting: Emergency Medicine

## 2014-02-03 DIAGNOSIS — M5440 Lumbago with sciatica, unspecified side: Secondary | ICD-10-CM

## 2014-02-03 DIAGNOSIS — Z23 Encounter for immunization: Secondary | ICD-10-CM

## 2014-02-03 DIAGNOSIS — M5430 Sciatica, unspecified side: Secondary | ICD-10-CM

## 2014-02-03 DIAGNOSIS — S61412A Laceration without foreign body of left hand, initial encounter: Secondary | ICD-10-CM

## 2014-02-03 DIAGNOSIS — S61409A Unspecified open wound of unspecified hand, initial encounter: Secondary | ICD-10-CM

## 2014-02-03 DIAGNOSIS — Y92009 Unspecified place in unspecified non-institutional (private) residence as the place of occurrence of the external cause: Secondary | ICD-10-CM

## 2014-02-03 MED ORDER — TETANUS-DIPHTH-ACELL PERTUSSIS 5-2.5-18.5 LF-MCG/0.5 IM SUSP
INTRAMUSCULAR | Status: AC
  Filled 2014-02-03: qty 0.5

## 2014-02-03 MED ORDER — TETANUS-DIPHTH-ACELL PERTUSSIS 5-2.5-18.5 LF-MCG/0.5 IM SUSP
0.5000 mL | Freq: Once | INTRAMUSCULAR | Status: AC
  Administered 2014-02-03: 0.5 mL via INTRAMUSCULAR

## 2014-02-03 MED ORDER — HYDROCODONE-ACETAMINOPHEN 5-325 MG PO TABS: 1.0000 | ORAL_TABLET | Freq: Four times a day (QID) | ORAL | Status: DC | PRN

## 2014-02-03 NOTE — ED Notes (Signed)
Hand laceration last PM, no bleeding at present

## 2014-02-03 NOTE — ED Provider Notes (Signed)
Sherry [name] is a 48 y.o. female who presents to Urgent Care today for left hand laceration and back pain. 1) left hand laceration: Patient suffered a laceration to the left hand yesterday evening about 8:00. She was cutting potatoes when the knife slipped. She notes 2 small lacerations at the base of the thumb along the ulnar aspect. She denies any radiating pain weakness numbness or difficulty with motion. She was able to stop bleeding with compression. She feels well otherwise. Her last tetanus shot was over 5 years ago. 2) back pain: Patient is a one-month history of worsening back pain with radiation to the right leg. The pain is similar to pain she was experiencing prior to a discectomy about 2 years ago. She denies any weakness numbness bowel bladder dysfunction or difficulty walking. The pain is moderate and worse activity. She's tried Tylenol which has helped a little. In the past Norco has worked. She has not yet scheduled an appointment with her spine surgeon. Patient notes that she seems a swelling in the right lateral hip. However she denies any pain the location swelling. She has the swelling has decreased since this morning.    Past Medical History  Diagnosis Date  . Vitamin D deficiency   . Gallstones   . Breast cancer 2010    s/p mastectomy  . Sinus infection     pt is on Doxycycline  . History of blood clots     neck-after reconstructive surgery  . Chronic back pain     bulding/herniated disc  . Hemorrhoids   . Constipation     related to pain meds and takes stool softener prn  . Nocturia   . Diabetes mellitus     takes Saxagliptin-Metformin daily  . Lyme disease     taking Doxycycline-to finish up 06/03/12  . Frozen shoulder   . DVT (deep venous thrombosis) 08/13/2013    R subclavian vein RUE.  . DVT of right axillary vein, acute 11/09/2013   History  Substance Use Topics  . Smoking status: Current Every Day Smoker -- 1.00 packs/day for 28 years    Types:  Cigarettes  . Smokeless tobacco: Never Used  . Alcohol Use: No   ROS as above Medications: Current Facility-Administered Medications  Medication Dose Route Frequency Provider Last Rate Last Dose  . Tdap (BOOSTRIX) injection 0.5 mL  0.5 mL Intramuscular Once Gregor Hams, MD       Current Outpatient Prescriptions  Medication Sig Dispense Refill  . enoxaparin (LOVENOX) 120 MG/0.8ML injection Inject 0.8 mLs (120 mg total) into the skin daily.  30 Syringe  5  . metFORMIN (GLUCOPHAGE) 500 MG tablet Take 1 tablet (500 mg total) by mouth 2 (two) times daily with a meal.  180 tablet  1  . sitaGLIPtin (JANUVIA) 100 MG tablet Take 1 tablet (100 mg total) by mouth daily.  90 tablet  3  . acetaminophen (TYLENOL) 500 MG tablet Take 500-1,000 mg by mouth every 6 (six) hours as needed for mild pain, moderate pain, fever or headache.      [name] HYDROcodone-acetaminophen (NORCO/VICODIN) 5-325 MG per tablet Take 1 tablet by mouth every 6 (six) hours as needed.  15 tablet  0  . ondansetron (ZOFRAN ODT) 8 MG disintegrating tablet 8mg  ODT q4 hours prn nausea  4 tablet  0    Exam:  BP 162/95  Pulse 90  Temp(Src) 98.6 F (37 C) (Oral)  Resp 16  SpO2 98%  LMP 11/10/2013 Gen: Well NAD  HEENT: EOMI,  MMM Lungs: Normal work of breathing. CTABL Heart: RRR no MRG Abd: NABS, Soft. NT, ND Exts: Brisk capillary refill, warm and well perfused.  Skin: 2 shallow lacerations to the skin at the base of the left thumb. The lacerations do not extend deep. The total length of both lacerations about 1 cm. Appear to be clean and well with no bleeding. Back: Nontender to spinal midline. Tender palpation right low back. Negative straight leg raise test. Strength is intact throughout. Reflexes are equal and intact throughout. Normal gait. Hip: No swelling appreciated. No hernia present.  Assessment and Plan: 48 y.o. female with  1) left hand laceration: Over 15 hours old. Does not need to treat at this time. Plan for healing  by secondary intent. Wound care instructions provided. Tetanus vaccination provided. 2) lumbago sciatica: Norco. Followup with Dr. Rolena Infante spine surgeon.   Discussed warning signs or symptoms. Please see discharge instructions. Patient expresses understanding.    Gregor Hams, MD 02/03/14 [phone]

## 2014-02-03 NOTE — Discharge Instructions (Signed)
Thank you for coming in today. Followup with Dr. Rolena Infante at [facility].  Keep the wound clean.  Use antibiotic ointment as needed.  Return if the hand wound worsens. Come back or go to the emergency room if you notice new weakness new numbness problems walking or bowel or bladder problems.  Sciatica Sciatica is pain, weakness, numbness, or tingling along the path of the sciatic nerve. The nerve starts in the lower back and runs down the back of each leg. The nerve controls the muscles in the lower leg and in the back of the knee, while also providing sensation to the back of the thigh, lower leg, and the sole of your foot. Sciatica is a symptom of another medical condition. For instance, nerve damage or certain conditions, such as a herniated disk or bone spur on the spine, pinch or put pressure on the sciatic nerve. This causes the pain, weakness, or other sensations normally associated with sciatica. Generally, sciatica only affects one side of the body. CAUSES   Herniated or slipped disc.  Degenerative disk disease.  A pain disorder involving the narrow muscle in the buttocks (piriformis syndrome).  Pelvic injury or fracture.  Pregnancy.  Tumor (rare). SYMPTOMS  Symptoms can vary from mild to very severe. The symptoms usually travel from the low back to the buttocks and down the back of the leg. Symptoms can include:  Mild tingling or dull aches in the lower back, leg, or hip.  Numbness in the back of the calf or sole of the foot.  Burning sensations in the lower back, leg, or hip.  Sharp pains in the lower back, leg, or hip.  Leg weakness.  Severe back pain inhibiting movement. These symptoms may get worse with coughing, sneezing, laughing, or prolonged sitting or standing. Also, being overweight may worsen symptoms. DIAGNOSIS  Your caregiver will perform a physical exam to look for common symptoms of sciatica. He or she may ask you to do certain movements or  activities that would trigger sciatic nerve pain. Other tests may be performed to find the cause of the sciatica. These may include:  Blood tests.  X-rays.  Imaging tests, such as an MRI or CT scan. TREATMENT  Treatment is directed at the cause of the sciatic pain. Sometimes, treatment is not necessary and the pain and discomfort goes away on its own. If treatment is needed, your caregiver may suggest:  Over-the-counter medicines to relieve pain.  Prescription medicines, such as anti-inflammatory medicine, muscle relaxants, or narcotics.  Applying heat or ice to the painful area.  Steroid injections to lessen pain, irritation, and inflammation around the nerve.  Reducing activity during periods of pain.  Exercising and stretching to strengthen your abdomen and improve flexibility of your spine. Your caregiver may suggest losing weight if the extra weight makes the back pain worse.  Physical therapy.  Surgery to eliminate what is pressing or pinching the nerve, such as a bone spur or part of a herniated disk. HOME CARE INSTRUCTIONS   Only take over-the-counter or prescription medicines for pain or discomfort as directed by your caregiver.  Apply ice to the affected area for 20 minutes, 3 4 times a day for the first 48 72 hours. Then try heat in the same way.  Exercise, stretch, or perform your usual activities if these do not aggravate your pain.  Attend physical therapy sessions as directed by your caregiver.  Keep all follow-up appointments as directed by your caregiver.  Do not wear high  heels or shoes that do not provide proper support.  Check your mattress to see if it is too soft. A firm mattress may lessen your pain and discomfort. SEEK IMMEDIATE MEDICAL CARE IF:   You lose control of your bowel or bladder (incontinence).  You have increasing weakness in the lower back, pelvis, buttocks, or legs.  You have redness or swelling of your back.  You have a burning  sensation when you urinate.  You have pain that gets worse when you lie down or awakens you at night.  Your pain is worse than you have experienced in the past.  Your pain is lasting longer than 4 weeks.  You are suddenly losing weight without reason. MAKE SURE YOU:  Understand these instructions.  Will watch your condition.  Will get help right away if you are not doing well or get worse. Document Released: 09/24/2001 Document Revised: 03/31/2012 Document Reviewed: 02/09/2012 [facility] Patient Information 2014 [place]. Laceration Care, Adult A laceration is a cut or lesion that goes through all layers of the skin and into the tissue just beneath the skin. TREATMENT  Some lacerations may not require closure. Some lacerations may not be able to be closed due to an increased risk of infection. It is important to see your caregiver as soon as possible after an injury to minimize the risk of infection and maximize the opportunity for successful closure. If closure is appropriate, pain medicines may be given, if needed. The wound will be cleaned to help prevent infection. Your caregiver will use stitches (sutures), staples, wound glue (adhesive), or skin adhesive strips to repair the laceration. These tools bring the skin edges together to allow for faster healing and a better cosmetic outcome. However, all wounds will heal with a scar. Once the wound has healed, scarring can be minimized by covering the wound with sunscreen during the day for 1 full year. HOME CARE INSTRUCTIONS  For sutures or staples:  Keep the wound clean and dry.  If you were given a bandage (dressing), you should change it at least once a day. Also, change the dressing if it becomes wet or dirty, or as directed by your caregiver.  Wash the wound with soap and water 2 times a day. Rinse the wound off with water to remove all soap. Pat the wound dry with a clean towel.  After cleaning, apply a thin layer of the  antibiotic ointment as recommended by your caregiver. This will help prevent infection and keep the dressing from sticking.  You may shower as usual after the first 24 hours. Do not soak the wound in water until the sutures are removed.  Only take over-the-counter or prescription medicines for pain, discomfort, or fever as directed by your caregiver.  Get your sutures or staples removed as directed by your caregiver. For skin adhesive strips:  Keep the wound clean and dry.  Do not get the skin adhesive strips wet. You may bathe carefully, using caution to keep the wound dry.  If the wound gets wet, pat it dry with a clean towel.  Skin adhesive strips will fall off on their own. You may trim the strips as the wound heals. Do not remove skin adhesive strips that are still stuck to the wound. They will fall off in time. For wound adhesive:  You may briefly wet your wound in the shower or bath. Do not soak or scrub the wound. Do not swim. Avoid periods of heavy perspiration until the skin adhesive  has fallen off on its own. After showering or bathing, gently pat the wound dry with a clean towel.  Do not apply liquid medicine, cream medicine, or ointment medicine to your wound while the skin adhesive is in place. This may loosen the film before your wound is healed.  If a dressing is placed over the wound, be careful not to apply tape directly over the skin adhesive. This may cause the adhesive to be pulled off before the wound is healed.  Avoid prolonged exposure to sunlight or tanning lamps while the skin adhesive is in place. Exposure to ultraviolet light in the first year will darken the scar.  The skin adhesive will usually remain in place for 5 to 10 days, then naturally fall off the skin. Do not pick at the adhesive film. You may need a tetanus shot if:  You cannot remember when you had your last tetanus shot.  You have never had a tetanus shot. If you get a tetanus shot, your arm  may swell, get red, and feel warm to the touch. This is common and not a problem. If you need a tetanus shot and you choose not to have one, there is a rare chance of getting tetanus. Sickness from tetanus can be serious. SEEK MEDICAL CARE IF:   You have redness, swelling, or increasing pain in the wound.  You see a red line that goes away from the wound.  You have yellowish-white fluid (pus) coming from the wound.  You have a fever.  You notice a bad smell coming from the wound or dressing.  Your wound breaks open before or after sutures have been removed.  You notice something coming out of the wound such as wood or glass.  Your wound is on your hand or foot and you cannot move a finger or toe. SEEK IMMEDIATE MEDICAL CARE IF:   Your pain is not controlled with prescribed medicine.  You have severe swelling around the wound causing pain and numbness or a change in color in your arm, hand, leg, or foot.  Your wound splits open and starts bleeding.  You have worsening numbness, weakness, or loss of function of any joint around or beyond the wound.  You develop painful lumps near the wound or on the skin anywhere on your body. MAKE SURE YOU:   Understand these instructions.  Will watch your condition.  Will get help right away if you are not doing well or get worse. Document Released: 09/30/2005 Document Revised: 12/23/2011 Document Reviewed: 03/26/2011 [facility] Patient Information 2014 [place], [place].

## 2014-02-25 ENCOUNTER — Emergency Department (HOSPITAL_COMMUNITY)
Admission: EM | Admit: 2014-02-25 | Discharge: 2014-02-25 | Disposition: A | Discharge status: Home / Self Care | Payer: Managed Care, Other (non HMO) | Attending: Emergency Medicine | Admitting: Emergency Medicine

## 2014-02-25 ENCOUNTER — Emergency Department (HOSPITAL_COMMUNITY): Payer: Managed Care, Other (non HMO)

## 2014-02-25 ENCOUNTER — Encounter (HOSPITAL_COMMUNITY): Payer: Self-pay | Admitting: Emergency Medicine

## 2014-02-25 DIAGNOSIS — G8929 Other chronic pain: Secondary | ICD-10-CM | POA: Insufficient documentation

## 2014-02-25 DIAGNOSIS — Z8639 Personal history of other endocrine, nutritional and metabolic disease: Secondary | ICD-10-CM | POA: Insufficient documentation

## 2014-02-25 DIAGNOSIS — S79919A Unspecified injury of unspecified hip, initial encounter: Secondary | ICD-10-CM | POA: Insufficient documentation

## 2014-02-25 DIAGNOSIS — Y92009 Unspecified place in unspecified non-institutional (private) residence as the place of occurrence of the external cause: Secondary | ICD-10-CM | POA: Insufficient documentation

## 2014-02-25 DIAGNOSIS — E119 Type 2 diabetes mellitus without complications: Secondary | ICD-10-CM | POA: Insufficient documentation

## 2014-02-25 DIAGNOSIS — Z8619 Personal history of other infectious and parasitic diseases: Secondary | ICD-10-CM | POA: Insufficient documentation

## 2014-02-25 DIAGNOSIS — M25551 Pain in right hip: Secondary | ICD-10-CM

## 2014-02-25 DIAGNOSIS — Z8679 Personal history of other diseases of the circulatory system: Secondary | ICD-10-CM | POA: Insufficient documentation

## 2014-02-25 DIAGNOSIS — R296 Repeated falls: Secondary | ICD-10-CM | POA: Insufficient documentation

## 2014-02-25 DIAGNOSIS — Z86718 Personal history of other venous thrombosis and embolism: Secondary | ICD-10-CM | POA: Insufficient documentation

## 2014-02-25 DIAGNOSIS — S99919A Unspecified injury of unspecified ankle, initial encounter: Principal | ICD-10-CM

## 2014-02-25 DIAGNOSIS — S8990XA Unspecified injury of unspecified lower leg, initial encounter: Secondary | ICD-10-CM | POA: Insufficient documentation

## 2014-02-25 DIAGNOSIS — S79929A Unspecified injury of unspecified thigh, initial encounter: Secondary | ICD-10-CM

## 2014-02-25 DIAGNOSIS — Z7901 Long term (current) use of anticoagulants: Secondary | ICD-10-CM | POA: Insufficient documentation

## 2014-02-25 DIAGNOSIS — K5900 Constipation, unspecified: Secondary | ICD-10-CM | POA: Insufficient documentation

## 2014-02-25 DIAGNOSIS — S99929A Unspecified injury of unspecified foot, initial encounter: Principal | ICD-10-CM

## 2014-02-25 DIAGNOSIS — Z79899 Other long term (current) drug therapy: Secondary | ICD-10-CM | POA: Insufficient documentation

## 2014-02-25 DIAGNOSIS — Y9301 Activity, walking, marching and hiking: Secondary | ICD-10-CM | POA: Insufficient documentation

## 2014-02-25 DIAGNOSIS — Z853 Personal history of malignant neoplasm of breast: Secondary | ICD-10-CM | POA: Insufficient documentation

## 2014-02-25 DIAGNOSIS — M5416 Radiculopathy, lumbar region: Secondary | ICD-10-CM

## 2014-02-25 DIAGNOSIS — Z862 Personal history of diseases of the blood and blood-forming organs and certain disorders involving the immune mechanism: Secondary | ICD-10-CM | POA: Insufficient documentation

## 2014-02-25 DIAGNOSIS — F17200 Nicotine dependence, unspecified, uncomplicated: Secondary | ICD-10-CM | POA: Insufficient documentation

## 2014-02-25 DIAGNOSIS — IMO0002 Reserved for concepts with insufficient information to code with codable children: Secondary | ICD-10-CM | POA: Insufficient documentation

## 2014-02-25 DIAGNOSIS — M25561 Pain in right knee: Secondary | ICD-10-CM

## 2014-02-25 DIAGNOSIS — Z8709 Personal history of other diseases of the respiratory system: Secondary | ICD-10-CM | POA: Insufficient documentation

## 2014-02-25 MED ORDER — HYDROCODONE-ACETAMINOPHEN 5-325 MG PO TABS: 1.0000 | ORAL_TABLET | ORAL | Status: DC | PRN

## 2014-02-25 MED ORDER — HYDROCODONE-ACETAMINOPHEN 5-325 MG PO TABS
2.0000 | ORAL_TABLET | Freq: Once | ORAL | Status: AC
  Administered 2014-02-25: 2 via ORAL
  Filled 2014-02-25: qty 2

## 2014-02-25 NOTE — ED Notes (Signed)
Pt reports surgery on her lower back x 1 year. Pt states that she has been having "problems" with her right hip, resulting in pain that radiates to her right knee. Pt states that her pain was so bad, that her leg gave out. Pt denies LOC or hitting her head.

## 2014-02-25 NOTE — Discharge Instructions (Signed)
Please follow up with the orthopedist office as scheduled.  Take medications as prescribed.  Return to the ER for worsening pain, weakness, loss of bowel or bladder control, or other new concerning symptoms.   Arthralgia Your caregiver has diagnosed you as suffering from an arthralgia. Arthralgia means there is pain in a joint. This can come from many reasons including:  Bruising the joint which causes soreness (inflammation) in the joint.  Wear and tear on the joints which occur as we grow older (osteoarthritis).  Overusing the joint.  Various forms of arthritis.  Infections of the joint. Regardless of the cause of pain in your joint, most of these different pains respond to anti-inflammatory drugs and rest. The exception to this is when a joint is infected, and these cases are treated with antibiotics, if it is a bacterial infection. HOME CARE INSTRUCTIONS   Rest the injured area for as long as directed by your caregiver. Then slowly start using the joint as directed by your caregiver and as the pain allows. Crutches as directed may be useful if the ankles, knees or hips are involved. If the knee was splinted or casted, continue use and care as directed. If an stretchy or elastic wrapping bandage has been applied today, it should be removed and re-applied every 3 to 4 hours. It should not be applied tightly, but firmly enough to keep swelling down. Watch toes and feet for swelling, bluish discoloration, coldness, numbness or excessive pain. If any of these problems (symptoms) occur, remove the ace bandage and re-apply more loosely. If these symptoms persist, contact your caregiver or return to this location.  For the first 24 hours, keep the injured extremity elevated on pillows while lying down.  Apply ice for 15-20 minutes to the sore joint every couple hours while awake for the first half day. Then 03-04 times per day for the first 48 hours. Put the ice in a plastic bag and place a towel  between the bag of ice and your skin.  Wear any splinting, casting, elastic bandage applications, or slings as instructed.  Only take over-the-counter or prescription medicines for pain, discomfort, or fever as directed by your caregiver. Do not use aspirin immediately after the injury unless instructed by your physician. Aspirin can cause increased bleeding and bruising of the tissues.  If you were given crutches, continue to use them as instructed and do not resume weight bearing on the sore joint until instructed. Persistent pain and inability to use the sore joint as directed for more than 2 to 3 days are warning signs indicating that you should see a caregiver for a follow-up visit as soon as possible. Initially, a hairline fracture (break in bone) may not be evident on X-rays. Persistent pain and swelling indicate that further evaluation, non-weight bearing or use of the joint (use of crutches or slings as instructed), or further X-rays are indicated. X-rays may sometimes not show a small fracture until a week or 10 days later. Make a follow-up appointment with your own caregiver or one to whom we have referred you. A radiologist (specialist in reading X-rays) may read your X-rays. Make sure you know how you are to obtain your X-ray results. Do not assume everything is normal if you do not hear from [place]. SEEK MEDICAL CARE IF: Bruising, swelling, or pain increases. SEEK IMMEDIATE MEDICAL CARE IF:   Your fingers or toes are numb or blue.  The pain is not responding to medications and continues to stay the  same or get worse.  The pain in your joint becomes severe.  You develop a fever over 102 F (38.9 C).  It becomes impossible to move or use the joint. MAKE SURE YOU:   Understand these instructions.  Will watch your condition.  Will get help right away if you are not doing well or get worse. Document Released: 09/30/2005 Document Revised: 12/23/2011 Document Reviewed:  05/18/2008 [facility] Patient Information 2014 [place].  Knee Pain Knee pain can be a result of an injury or other medical conditions. Treatment will depend on the cause of your pain. HOME CARE  Only take medicine as told by your doctor.  Keep a healthy weight. Being overweight can make the knee hurt more.  Stretch before exercising or playing sports.  If there is constant knee pain, change the way you exercise. Ask your doctor for advice.  Make sure shoes fit well. Choose the right shoe for the sport or activity.  Protect your knees. Wear kneepads if needed.  Rest when you are tired. GET HELP RIGHT AWAY IF:   Your knee pain does not stop.  Your knee pain does not get better.  Your knee joint feels hot to the touch.  You have a fever. MAKE SURE YOU:   Understand these instructions.  Will watch this condition.  Will get help right away if you are not doing well or get worse. Document Released: 12/27/2008 Document Revised: 12/23/2011 Document Reviewed: 12/27/2008 [facility] Patient Information 2014 [place], [place].  Lumbosacral Radiculopathy Lumbosacral radiculopathy is a pinched nerve or nerves in the low back (lumbosacral area). When this happens you may have weakness in your legs and may not be able to stand on your toes. You may have pain going down into your legs. There may be difficulties with walking normally. There are many causes of this problem. Sometimes this may happen from an injury, or simply from arthritis or boney problems. It may also be caused by other illnesses such as diabetes. If there is no improvement after treatment, further studies may be done to find the exact cause. DIAGNOSIS  X-rays may be needed if the problems become long standing. Electromyograms may be done. This study is one in which the working of nerves and muscles is studied. HOME CARE INSTRUCTIONS   Applications of ice packs may be helpful. Ice can be used in a plastic bag with a  towel around it to prevent frostbite to skin. This may be used every 2 hours for 20 to 30 minutes, or as needed, while awake, or as directed by your caregiver.  Only take over-the-counter or prescription medicines for pain, discomfort, or fever as directed by your caregiver.  If physical therapy was prescribed, follow your caregiver's directions. SEEK IMMEDIATE MEDICAL CARE IF:   You have pain not controlled with medications.  You seem to be getting worse rather than better.  You develop increasing weakness in your legs.  You develop loss of bowel or bladder control.  You have difficulty with walking or balance, or develop clumsiness in the use of your legs.  You have a fever. MAKE SURE YOU:   Understand these instructions.  Will watch your condition.  Will get help right away if you are not doing well or get worse. Document Released: 09/30/2005 Document Revised: 12/23/2011 Document Reviewed: 05/20/2008 [facility] Patient Information 2014 [place].

## 2014-02-25 NOTE — ED Provider Notes (Signed)
CSN: [id number]     Arrival date & time 02/25/14  0040 History   First MD Initiated Contact with Patient 02/25/14 0449     Chief Complaint  Patient presents with  . Fall     (Consider location/radiation/quality/duration/timing/severity/associated sxs/prior Treatment) HPI 48 yo female presents to the ER from home with complaint of fall at home, right back and leg pain. Pt has had ongoing right back and leg pain for the last 1-2 months.  She reports at time the pain causes her leg to "go out".  Tonight she was walking down the hallway when this occurred.  She and family were able to catch her prior to falling all the way down.  No head injury, no LOC.  No bowel or bladder incontinence.  No numbness, weakness currently, only pain.  Pt does not currently have pain medications.   Past Medical History  Diagnosis Date  . Vitamin D deficiency   . Gallstones   . Breast cancer 2010    s/p mastectomy  . Sinus infection     pt is on Doxycycline  . History of blood clots     neck-after reconstructive surgery  . Chronic back pain     bulding/herniated disc  . Hemorrhoids   . Constipation     related to pain meds and takes stool softener prn  . Nocturia   . Diabetes mellitus     takes Saxagliptin-Metformin daily  . Lyme disease     taking Doxycycline-to finish up 06/03/12  . Frozen shoulder   . DVT (deep venous thrombosis) 08/13/2013    R subclavian vein RUE.  . DVT of right axillary vein, acute 11/09/2013   Past Surgical History  Procedure Laterality Date  . Cesarean section  1992/1995  . Mastectomy  04/27/10    right  . Breast surgery    . Breast reconstruction  7/12  . Port a cath placed  2011  . Port a cath removed    . Lumbar laminectomy/decompression microdiscectomy  06/04/2012    Procedure: LUMBAR LAMINECTOMY/DECOMPRESSION MICRODISCECTOMY 1 LEVEL;  Surgeon: Melina Schools, MD;  Location: [facility];  Service: Orthopedics;  Laterality: N/A;  L4-5 BILATERAL HEMILAMINECTOMY/MICRODISCECTOMY   . Back surgery    . Tubal ligation     Family History  Problem Relation Age of Onset  . Diabetes Mother   . Diabetes Sister    History  Substance Use Topics  . Smoking status: Current Every Day Smoker -- 1.00 packs/day for 28 years    Types: Cigarettes  . Smokeless tobacco: Never Used  . Alcohol Use: No   OB History   Grav Para Term Preterm Abortions TAB SAB Ect Mult Living                 Review of Systems  All other systems reviewed and are negative.     Allergies  Tramadol  Home Medications   Prior to Admission medications   Medication Sig Start Date End Date Taking? Authorizing Provider  acetaminophen (TYLENOL) 500 MG tablet Take 500-1,000 mg by mouth every 6 (six) hours as needed for mild pain, moderate pain, fever or headache.   Yes Historical Provider, MD  enoxaparin (LOVENOX) 120 MG/0.8ML injection Inject 0.8 mLs (120 mg total) into the skin daily. 08/15/13  Yes Robyn Haber, MD  metFORMIN (GLUCOPHAGE) 500 MG tablet Take 1 tablet (500 mg total) by mouth 2 (two) times daily with a meal. 11/23/13  Yes Orma Flaming, MD  sitaGLIPtin (JANUVIA) 100 MG  tablet Take 1 tablet (100 mg total) by mouth daily. 08/13/13  Yes Orma Flaming, MD  HYDROcodone-acetaminophen (NORCO/VICODIN) 5-325 MG per tablet Take 1-2 tablets by mouth every 4 (four) hours as needed. 02/25/14   Kalman Drape, MD   BP 147/76  Pulse 75  Temp(Src) 98.8 F (37.1 C)  Resp 18  SpO2 99%  LMP 11/10/2013 Physical Exam  Nursing note and vitals reviewed. Constitutional: She is oriented to person, place, and time. She appears well-developed and well-nourished. She appears distressed (uncomfortable appearing).  HENT:  Head: Normocephalic and atraumatic.  Right Ear: External ear normal.  Left Ear: External ear normal.  Nose: Nose normal.  Mouth/Throat: Oropharynx is clear and moist.  Eyes: Conjunctivae and EOM are normal. Pupils are equal, round, and reactive to light.  Neck: Normal range of motion.  Neck supple. No JVD present. No tracheal deviation present. No thyromegaly present.  Cardiovascular: Normal rate, regular rhythm, normal heart sounds and intact distal pulses.  Exam reveals no gallop and no friction rub.   No murmur heard. Pulmonary/Chest: Effort normal and breath sounds normal. No stridor. No respiratory distress. She has no wheezes. She has no rales. She exhibits no tenderness.  Abdominal: Soft. Bowel sounds are normal. She exhibits no distension and no mass. There is no tenderness. There is no rebound and no guarding.  Musculoskeletal: She exhibits tenderness (ttp over right lower back, SI joint, right hip with palpaiton.  No deformity, crepitus, stepoff.  Pain with palpation of right medial knee). She exhibits no edema.  Decreased ROM at knee, hip, low back on right secondary to pain  Lymphadenopathy:    She has no cervical adenopathy.  Neurological: She is alert and oriented to person, place, and time. She has normal reflexes. She exhibits normal muscle tone. Coordination normal.  Skin: Skin is warm and dry. No rash noted. No erythema. No pallor.  Psychiatric: She has a normal mood and affect. Her behavior is normal. Judgment and thought content normal.    ED Course  Procedures (including critical care time) Labs Review Labs Reviewed - No data to display  Imaging Review Dg Lumbar Spine Complete  02/25/2014   CLINICAL DATA:  Low back pain through the right knee after a fall tonight.  EXAM: LUMBAR SPINE - COMPLETE 4+ VIEW  COMPARISON:  CT ABD/PELVIS W CM dated 01/27/2014; DG LUMBAR SPINE 1 VIEW dated 06/04/2012; DG LUMBAR SPINE 2-3 VIEWS dated 06/04/2012; DG LUMBAR SPINE 2-3 VIEWS dated 05/29/2012  FINDINGS: There is no evidence of lumbar spine fracture. Alignment is normal. Intervertebral disc spaces are maintained. Cholelithiasis.  IMPRESSION: Negative.   Electronically Signed   By: Lucienne Capers M.D.   On: 02/25/2014 01:20   Dg Hip Complete Right  02/25/2014   CLINICAL  DATA:  Low back anterior right knee after a fall tonight.  EXAM: RIGHT HIP - COMPLETE 2+ VIEW  COMPARISON:  KNEE - 4 VIEWS dated 07/20/2011; HIP - 2 VIEWS dated 02/05/2011  FINDINGS: There is no evidence of hip fracture or dislocation. There is no evidence of arthropathy or other focal bone abnormality.  IMPRESSION: Negative.   Electronically Signed   By: Lucienne Capers M.D.   On: 02/25/2014 01:18   Dg Knee 2 Views Right  02/25/2014   CLINICAL DATA:  Low back pain.  Right knee after a fall tonight.  EXAM: RIGHT KNEE - 1-2 VIEW  COMPARISON:  DG HIP COMPLETE*R* dated 02/25/2014; KNEE - 4 VIEWS dated 07/20/2011  FINDINGS: There is no  evidence of fracture, dislocation, or joint effusion. There is no evidence of arthropathy or other focal bone abnormality. Soft tissues are unremarkable.  IMPRESSION: Negative.   Electronically Signed   By: Lucienne Capers M.D.   On: 02/25/2014 01:18     EKG Interpretation None      MDM   Final diagnoses:  Radiculopathy, lumbar region  Right knee pain  Right hip pain    48 year old female with one month of right low back pain radiation into right leg.  Patient had mechanical fall today when leg gave way.  Patient neurovascularly intact, no red flags on exam or history.  Patient has followup with orthopedics scheduled on Monday.  Plan for pain control over the weekend until she is able to see the orthopedist.   Kalman Drape, MD 02/25/14 [phone]

## 2014-02-25 NOTE — ED Notes (Signed)
Pt. lost her balance " my legs gave out " and fell this evening at home , no LOC / ambulatory , reports pain at right lower back/ right hip and right knee pain .

## 2014-04-22 ENCOUNTER — Emergency Department (INDEPENDENT_AMBULATORY_CARE_PROVIDER_SITE_OTHER)
Admission: EM | Admit: 2014-04-22 | Discharge: 2014-04-22 | Disposition: A | Discharge status: Home / Self Care | Payer: Managed Care, Other (non HMO) | Source: Home / Self Care | Attending: Family Medicine | Admitting: Family Medicine

## 2014-04-22 ENCOUNTER — Encounter (HOSPITAL_COMMUNITY): Payer: Self-pay | Admitting: Emergency Medicine

## 2014-04-22 DIAGNOSIS — H9209 Otalgia, unspecified ear: Secondary | ICD-10-CM

## 2014-04-22 DIAGNOSIS — M542 Cervicalgia: Secondary | ICD-10-CM

## 2014-04-22 DIAGNOSIS — H9201 Otalgia, right ear: Secondary | ICD-10-CM

## 2014-04-22 MED ORDER — CYCLOBENZAPRINE HCL 10 MG PO TABS: 10.0000 mg | ORAL_TABLET | Freq: Three times a day (TID) | ORAL | Status: DC | PRN

## 2014-04-22 MED ORDER — FLUTICASONE PROPIONATE 50 MCG/ACT NA SUSP: 2.0000 | Freq: Two times a day (BID) | NASAL | Status: DC

## 2014-04-22 NOTE — ED Notes (Signed)
Pt c/o right ear pain x2 days States she was cleaning her ear w/q-tip and noticed blood Also c/o pain on right side of neck x2-3 weeks] Denies inj/trauma Alert w/no signs of acute distress

## 2014-04-22 NOTE — ED Provider Notes (Signed)
CSN: [id number]     Arrival date & time 04/22/14  1194 History   First MD Initiated Contact with Patient 04/22/14 0848     Chief Complaint  Patient presents with  . Otalgia  . Neck Pain   (Consider location/radiation/quality/duration/timing/severity/associated sxs/prior Treatment) HPI Comments: 48 year old female presents complaining of right ear pain for 2 days and right-sided neck pain for 2 weeks. For neck pain is mild to moderate and comes and goes. It is worse with turning her head. She thinks she may have slept on and caused her muscles to hurt. She has a history of postsurgical blood clot in her axillary vein 6 months ago and she is somewhat concerned about that being the cause of her neck pain although she is currently on daily Lovenox injections. He denies any swelling of her upper extremities or any neurologic symptoms. Also she has 2 days of right ear pain.  she used a Q-tip in her ear and it came out with some blood on it. She rates her ear ache as mild. she does not feel sick and has no fever. No other drainage from the ear since the blood 2 days ago. She also admits to a lot of sneezing   Patient is a 48 y.o. female presenting with ear pain and neck pain.  Otalgia Associated symptoms: ear discharge and neck pain   Neck Pain   Past Medical History  Diagnosis Date  . Vitamin D deficiency   . Gallstones   . Breast cancer 2010    s/p mastectomy  . Sinus infection     pt is on Doxycycline  . History of blood clots     neck-after reconstructive surgery  . Chronic back pain     bulding/herniated disc  . Hemorrhoids   . Constipation     related to pain meds and takes stool softener prn  . Nocturia   . Diabetes mellitus     takes Saxagliptin-Metformin daily  . Lyme disease     taking Doxycycline-to finish up 06/03/12  . Frozen shoulder   . DVT (deep venous thrombosis) 08/13/2013    R subclavian vein RUE.  . DVT of right axillary vein, acute 11/09/2013   Past Surgical  History  Procedure Laterality Date  . Cesarean section  1992/1995  . Mastectomy  04/27/10    right  . Breast surgery    . Breast reconstruction  7/12  . Port a cath placed  2011  . Port a cath removed    . Lumbar laminectomy/decompression microdiscectomy  06/04/2012    Procedure: LUMBAR LAMINECTOMY/DECOMPRESSION MICRODISCECTOMY 1 LEVEL;  Surgeon: Melina Schools, MD;  Location: [facility];  Service: Orthopedics;  Laterality: N/A;  L4-5 BILATERAL HEMILAMINECTOMY/MICRODISCECTOMY  . Back surgery    . Tubal ligation     Family History  Problem Relation Age of Onset  . Diabetes Mother   . Diabetes Sister    History  Substance Use Topics  . Smoking status: Current Every Day Smoker -- 1.00 packs/day for 28 years    Types: Cigarettes  . Smokeless tobacco: Never Used  . Alcohol Use: No   OB History   Grav Para Term Preterm Abortions TAB SAB Ect Mult Living                 Review of Systems  HENT: Positive for ear discharge and ear pain.   Musculoskeletal: Positive for neck pain and neck stiffness.  All other systems reviewed and are negative.   Allergies  Tramadol  Home Medications   Prior to Admission medications   Medication Sig Start Date End Date Taking? Authorizing Provider  enoxaparin (LOVENOX) 120 MG/0.8ML injection Inject 0.8 mLs (120 mg total) into the skin daily. 08/15/13  Yes Robyn Haber, MD  HYDROcodone-acetaminophen (NORCO/VICODIN) 5-325 MG per tablet Take 1-2 tablets by mouth every 4 (four) hours as needed. 02/25/14  Yes Kalman Drape, MD  metFORMIN (GLUCOPHAGE) 500 MG tablet Take 1 tablet (500 mg total) by mouth 2 (two) times daily with a meal. 11/23/13  Yes Orma Flaming, MD  sitaGLIPtin (JANUVIA) 100 MG tablet Take 1 tablet (100 mg total) by mouth daily. 08/13/13  Yes Orma Flaming, MD  acetaminophen (TYLENOL) 500 MG tablet Take 500-1,000 mg by mouth every 6 (six) hours as needed for mild pain, moderate pain, fever or headache.    Historical Provider, MD   cyclobenzaprine (FLEXERIL) 10 MG tablet Take 1 tablet (10 mg total) by mouth 3 (three) times daily as needed for muscle spasms. 04/22/14   Freeman Caldron Sasan Wilkie, PA-C  fluticasone (FLONASE) 50 MCG/ACT nasal spray Place 2 sprays into both nostrils 2 (two) times daily. Decrease to 2 sprays/nostril daily after 5 days 04/22/14   Liam Graham, PA-C   BP 147/77  Pulse 82  Temp(Src) 99 F (37.2 C) (Oral)  Resp 16  SpO2 100%  LMP 03/31/2014 Physical Exam  Nursing note and vitals reviewed. Constitutional: She is oriented to person, place, and time. Vital signs are normal. She appears well-developed and well-nourished. No distress.  HENT:  Head: Normocephalic and atraumatic.  Right Ear: Tympanic membrane is injected. Tympanic membrane is not bulging. No middle ear effusion.  Left Ear: Tympanic membrane, external ear and ear canal normal.  Nose: Nose normal. Right sinus exhibits no maxillary sinus tenderness and no frontal sinus tenderness. Left sinus exhibits no maxillary sinus tenderness and no frontal sinus tenderness.  Mouth/Throat: Uvula is midline, oropharynx is clear and moist and mucous membranes are normal.  Symmetric rise of the soft palate  Eyes: Conjunctivae and EOM are normal. Pupils are equal, round, and reactive to light. Right eye exhibits no discharge. Left eye exhibits no discharge.  Neck: Normal range of motion. Neck supple. Muscular tenderness (right sternocleidomastoid spasm and tenderness) present. No spinous process tenderness present. No rigidity. No erythema present.  Cardiovascular: Normal rate, regular rhythm and normal heart sounds.   Pulmonary/Chest: Effort normal. No respiratory distress.  Neurological: She is alert and oriented to person, place, and time. She has normal strength. No cranial nerve deficit or sensory deficit. She displays a negative Romberg sign. Coordination and gait normal. GCS eye subscore is 4. GCS verbal subscore is 5. GCS motor subscore is 6.  Skin: Skin  is warm and dry. No rash noted. She is not diaphoretic.  Psychiatric: She has a normal mood and affect. Judgment normal.    ED Course  Procedures (including critical care time) Labs Review Labs Reviewed - No data to display  Imaging Review No results found.   MDM   1. Otalgia, right   2. Neck pain    For the neck, we will use Flexeril, heating pad. Followup with primary care if no improvement. Do not suspect blood clot, with alert she is very worried about that she may go to the emergency department or back to her primary care to have an outpatient imaging study done.  For the ear, will use Flonase, and Tylenol allergy sinus as needed. Followup as needed   Meds  ordered this encounter  Medications  . fluticasone (FLONASE) 50 MCG/ACT nasal spray    Sig: Place 2 sprays into both nostrils 2 (two) times daily. Decrease to 2 sprays/nostril daily after 5 days    Dispense:  16 g    Refill:  2    Order Specific Question:  Supervising Provider    Answer:  Jake Michaelis, DAVID C [id number]  . cyclobenzaprine (FLEXERIL) 10 MG tablet    Sig: Take 1 tablet (10 mg total) by mouth 3 (three) times daily as needed for muscle spasms.    Dispense:  30 tablet    Refill:  0    Order Specific Question:  Supervising Provider    Answer:  Jake Michaelis, DAVID C [[id number]]      Liam Graham, PA-C 04/22/14 0930

## 2014-04-22 NOTE — Discharge Instructions (Signed)
Otalgia °The most common reason for this in children is an infection of the middle ear. Pain from the middle ear is usually caused by a build-up of fluid and pressure behind the eardrum. Pain from an earache can be sharp, dull, or burning. The pain may be temporary or constant. The middle ear is connected to the nasal passages by a short narrow tube called the Eustachian tube. The Eustachian tube allows fluid to drain out of the middle ear, and helps keep the pressure in your ear equalized. °CAUSES  °A cold or allergy can block the Eustachian tube with inflammation and the build-up of secretions. This is especially likely in small children, because their Eustachian tube is shorter and more horizontal. When the Eustachian tube closes, the normal flow of fluid from the middle ear is stopped. Fluid can accumulate and cause stuffiness, pain, hearing loss, and an ear infection if germs start growing in this area. °SYMPTOMS  °The symptoms of an ear infection may include fever, ear pain, fussiness, increased crying, and irritability. Many children will have temporary and minor hearing loss during and right after an ear infection. Permanent hearing loss is rare, but the risk increases the more infections a child has. Other causes of ear pain include retained water in the outer ear canal from swimming and bathing. °Ear pain in adults is less likely to be from an ear infection. Ear pain may be referred from other locations. Referred pain may be from the joint between your jaw and the skull. It may also come from a tooth problem or problems in the neck. Other causes of ear pain include: °· A foreign body in the ear. °· Outer ear infection. °· Sinus infections. °· Impacted ear wax. °· Ear injury. °· Arthritis of the jaw or TMJ problems. °· Middle ear infection. °· Tooth infections. °· Sore throat with pain to the ears. °DIAGNOSIS  °Your caregiver can usually make the diagnosis by examining you. Sometimes other special studies,  including x-rays and lab work may be necessary. °TREATMENT  °· If antibiotics were prescribed, use them as directed and finish them even if you or your child's symptoms seem to be improved. °· Sometimes PE tubes are needed in children. These are little plastic tubes which are put into the eardrum during a simple surgical procedure. They allow fluid to drain easier and allow the pressure in the middle ear to equalize. This helps relieve the ear pain caused by pressure changes. °HOME CARE INSTRUCTIONS  °· Only take over-the-counter or prescription medicines for pain, discomfort, or fever as directed by your caregiver. DO NOT GIVE CHILDREN ASPIRIN because of the association of Reye's Syndrome in children taking aspirin. °· Use a cold pack applied to the outer ear for 15-20 minutes, 03-04 times per day or as needed may reduce pain. Do not apply ice directly to the skin. You may cause frost bite. °· Over-the-counter ear drops used as directed may be effective. Your caregiver may sometimes prescribe ear drops. °· Resting in an upright position may help reduce pressure in the middle ear and relieve pain. °· Ear pain caused by rapidly descending from high altitudes can be relieved by swallowing or chewing gum. Allowing infants to suck on a bottle during airplane travel can help. °· Do not smoke in the house or near children. If you are unable to quit smoking, smoke outside. °· Control allergies. °SEEK IMMEDIATE MEDICAL CARE IF:  °· You or your child are becoming sicker. °· Pain or fever   relief is not obtained with medicine.  You or your child's symptoms (pain, fever, or irritability) do not improve within 24 to 48 hours or as instructed.  Severe pain suddenly stops hurting. This may indicate a ruptured eardrum.  You or your children develop new problems such as severe headaches, stiff neck, difficulty swallowing, or swelling of the face or around the ear. Document Released: 05/17/2004 Document Revised: 12/23/2011  Document Reviewed: 09/21/2008 [facility] Patient Information 2015 [place], [place]. This information is not intended to replace advice given to you by your health care provider. Make sure you discuss any questions you have with your health care provider.  Musculoskeletal Pain Musculoskeletal pain is muscle and boney aches and pains. These pains can occur in any part of the body. Your caregiver may treat you without knowing the cause of the pain. They may treat you if blood or urine tests, X-rays, and other tests were normal.  CAUSES There is often not a definite cause or reason for these pains. These pains may be caused by a type of germ (virus). The discomfort may also come from overuse. Overuse includes working out too hard when your body is not fit. Boney aches also come from weather changes. Bone is sensitive to atmospheric pressure changes. HOME CARE INSTRUCTIONS   Ask when your test results will be ready. Make sure you get your test results.  Only take over-the-counter or prescription medicines for pain, discomfort, or fever as directed by your caregiver. If you were given medications for your condition, do not drive, operate machinery or power tools, or sign legal documents for 24 hours. Do not drink alcohol. Do not take sleeping pills or other medications that may interfere with treatment.  Continue all activities unless the activities cause more pain. When the pain lessens, slowly resume normal activities. Gradually increase the intensity and duration of the activities or exercise.  During periods of severe pain, bed rest may be helpful. Lay or sit in any position that is comfortable.  Putting ice on the injured area.  Put ice in a bag.  Place a towel between your skin and the bag.  Leave the ice on for 15 to 20 minutes, 3 to 4 times a day.  Follow up with your caregiver for continued problems and no reason can be found for the pain. If the pain becomes worse or does not go away, it  may be necessary to repeat tests or do additional testing. Your caregiver may need to look further for a possible cause. SEEK IMMEDIATE MEDICAL CARE IF:  You have pain that is getting worse and is not relieved by medications.  You develop chest pain that is associated with shortness or breath, sweating, feeling sick to your stomach (nauseous), or throw up (vomit).  Your pain becomes localized to the abdomen.  You develop any new symptoms that seem different or that concern you. MAKE SURE YOU:   Understand these instructions.  Will watch your condition.  Will get help right away if you are not doing well or get worse. Document Released: 09/30/2005 Document Revised: 12/23/2011 Document Reviewed: 06/04/2013 [facility] Patient Information 2015 [place], [place]. This information is not intended to replace advice given to you by your health care provider. Make sure you discuss any questions you have with your health care provider.

## 2014-04-22 NOTE — ED Provider Notes (Signed)
Medical screening examination/treatment/procedure(s) were performed by resident physician or non-physician practitioner and as supervising physician I was immediately available for consultation/collaboration.   Pauline Good MD.   Billy Fischer, MD 04/22/14 1131

## 2014-06-21 ENCOUNTER — Emergency Department (HOSPITAL_COMMUNITY)
Admission: EM | Admit: 2014-06-21 | Discharge: 2014-06-21 | Disposition: A | Discharge status: Home / Self Care | Payer: Managed Care, Other (non HMO) | Attending: Emergency Medicine | Admitting: Emergency Medicine

## 2014-06-21 ENCOUNTER — Encounter (HOSPITAL_COMMUNITY): Payer: Self-pay | Admitting: Emergency Medicine

## 2014-06-21 DIAGNOSIS — Z8679 Personal history of other diseases of the circulatory system: Secondary | ICD-10-CM | POA: Diagnosis not present

## 2014-06-21 DIAGNOSIS — Z7901 Long term (current) use of anticoagulants: Secondary | ICD-10-CM | POA: Diagnosis not present

## 2014-06-21 DIAGNOSIS — G8929 Other chronic pain: Secondary | ICD-10-CM | POA: Diagnosis not present

## 2014-06-21 DIAGNOSIS — K5900 Constipation, unspecified: Secondary | ICD-10-CM | POA: Insufficient documentation

## 2014-06-21 DIAGNOSIS — Z8639 Personal history of other endocrine, nutritional and metabolic disease: Secondary | ICD-10-CM | POA: Diagnosis not present

## 2014-06-21 DIAGNOSIS — Z8709 Personal history of other diseases of the respiratory system: Secondary | ICD-10-CM | POA: Diagnosis not present

## 2014-06-21 DIAGNOSIS — F17200 Nicotine dependence, unspecified, uncomplicated: Secondary | ICD-10-CM | POA: Insufficient documentation

## 2014-06-21 DIAGNOSIS — M25569 Pain in unspecified knee: Secondary | ICD-10-CM | POA: Diagnosis present

## 2014-06-21 DIAGNOSIS — E119 Type 2 diabetes mellitus without complications: Secondary | ICD-10-CM | POA: Insufficient documentation

## 2014-06-21 DIAGNOSIS — Z9010 Acquired absence of unspecified breast and nipple: Secondary | ICD-10-CM | POA: Insufficient documentation

## 2014-06-21 DIAGNOSIS — Z8619 Personal history of other infectious and parasitic diseases: Secondary | ICD-10-CM | POA: Insufficient documentation

## 2014-06-21 DIAGNOSIS — Z86718 Personal history of other venous thrombosis and embolism: Secondary | ICD-10-CM | POA: Diagnosis not present

## 2014-06-21 DIAGNOSIS — M25561 Pain in right knee: Secondary | ICD-10-CM

## 2014-06-21 DIAGNOSIS — Z79899 Other long term (current) drug therapy: Secondary | ICD-10-CM | POA: Insufficient documentation

## 2014-06-21 DIAGNOSIS — M25551 Pain in right hip: Secondary | ICD-10-CM

## 2014-06-21 DIAGNOSIS — M25559 Pain in unspecified hip: Secondary | ICD-10-CM | POA: Insufficient documentation

## 2014-06-21 DIAGNOSIS — Z853 Personal history of malignant neoplasm of breast: Secondary | ICD-10-CM | POA: Diagnosis not present

## 2014-06-21 MED ORDER — HYDROCODONE-ACETAMINOPHEN 5-325 MG PO TABS: 1.0000 | ORAL_TABLET | ORAL | Status: DC | PRN

## 2014-06-21 MED ORDER — HYDROCODONE-ACETAMINOPHEN 5-325 MG PO TABS
2.0000 | ORAL_TABLET | Freq: Once | ORAL | Status: AC
  Administered 2014-06-21: 2 via ORAL
  Filled 2014-06-21: qty 2

## 2014-06-21 NOTE — ED Provider Notes (Signed)
CSN: [id number]     Arrival date & time 06/21/14  0153 History   First MD Initiated Contact with Patient 06/21/14 0600     Chief Complaint  Patient presents with  . Hip Pain  . Knee Pain     (Consider location/radiation/quality/duration/timing/severity/associated sxs/prior Treatment) The history is provided by the patient and medical records.    This is a 48 y.o. F with a past medical history significant for diabetes, DVT on chronic Lovenox, chronic back pain, presenting to the ED for right hip and knee pain. Patient states she's currently being followed by neurosurgery for her chronic back pain. States recently she made the decision to come off of her her chronic pain medications and switch to Neurontin instead. States she's been taking this medication for approximately 2 weeks, but states her pain has become unbearable.  She denies any new falls, injury, or trauma to her hip or knee.  She denies any new numbness, weakness, or paresthesias of her right leg. She denies any swelling or erythema. No fever or chills. Patient ambulates assisted with a cane at baseline.  Past Medical History  Diagnosis Date  . Vitamin D deficiency   . Gallstones   . Breast cancer 2010    s/p mastectomy  . Sinus infection     pt is on Doxycycline  . History of blood clots     neck-after reconstructive surgery  . Chronic back pain     bulding/herniated disc  . Hemorrhoids   . Constipation     related to pain meds and takes stool softener prn  . Nocturia   . Diabetes mellitus     takes Saxagliptin-Metformin daily  . Lyme disease     taking Doxycycline-to finish up 06/03/12  . Frozen shoulder   . DVT (deep venous thrombosis) 08/13/2013    R subclavian vein RUE.  . DVT of right axillary vein, acute 11/09/2013   Past Surgical History  Procedure Laterality Date  . Cesarean section  1992/1995  . Mastectomy  04/27/10    right  . Breast surgery    . Breast reconstruction  7/12  . Port a cath placed  2011   . Port a cath removed    . Lumbar laminectomy/decompression microdiscectomy  06/04/2012    Procedure: LUMBAR LAMINECTOMY/DECOMPRESSION MICRODISCECTOMY 1 LEVEL;  Surgeon: Melina Schools, MD;  Location: [facility];  Service: Orthopedics;  Laterality: N/A;  L4-5 BILATERAL HEMILAMINECTOMY/MICRODISCECTOMY  . Back surgery    . Tubal ligation     Family History  Problem Relation Age of Onset  . Diabetes Mother   . Diabetes Sister    History  Substance Use Topics  . Smoking status: Current Every Day Smoker -- 1.00 packs/day for 28 years    Types: Cigarettes  . Smokeless tobacco: Never Used  . Alcohol Use: No   OB History   Grav Para Term Preterm Abortions TAB SAB Ect Mult Living                 Review of Systems  Musculoskeletal: Positive for arthralgias.  All other systems reviewed and are negative.     Allergies  Tramadol  Home Medications   Prior to Admission medications   Medication Sig Start Date End Date Taking? Authorizing Provider  acetaminophen (TYLENOL) 500 MG tablet Take 500-1,000 mg by mouth every 6 (six) hours as needed for mild pain, moderate pain, fever or headache.   Yes Historical Provider, MD  enoxaparin (LOVENOX) 120 MG/0.8ML injection Inject 0.8 mLs (  120 mg total) into the skin daily. 08/15/13  Yes Robyn Haber, MD  gabapentin (NEURONTIN) 100 MG capsule Take 100 mg by mouth 2 (two) times daily.   Yes Historical Provider, MD  metFORMIN (GLUCOPHAGE) 500 MG tablet Take 1 tablet (500 mg total) by mouth 2 (two) times daily with a meal. 11/23/13  Yes Orma Flaming, MD  sitaGLIPtin (JANUVIA) 100 MG tablet Take 1 tablet (100 mg total) by mouth daily. 08/13/13  Yes Orma Flaming, MD   BP 130/79  Pulse 84  Temp(Src) 99.1 F (37.3 C) (Oral)  Resp 16  Ht 5\' 2"  (1.575 m)  Wt 145 lb (65.772 kg)  BMI 26.51 kg/m2  SpO2 99%  LMP 05/31/2014 Physical Exam  Nursing note and vitals reviewed. Constitutional: She is oriented to person, place, and time. She appears  well-developed and well-nourished.  HENT:  Head: Normocephalic and atraumatic.  Mouth/Throat: Oropharynx is clear and moist.  Eyes: Conjunctivae and EOM are normal. Pupils are equal, round, and reactive to light.  Neck: Normal range of motion. Neck supple.  Cardiovascular: Normal rate, regular rhythm and normal heart sounds.   Pulmonary/Chest: Effort normal and breath sounds normal. No respiratory distress. She has no wheezes.  Abdominal: Soft. Bowel sounds are normal. There is no tenderness. There is no guarding.  Musculoskeletal: Normal range of motion.  Endorses pain of right hip and knee without focal tenderness on exam, no bony deformities or swelling noted; full ROM of both joints, some pain with this; no calf asymmetry, tenderness, or palpable cords; no overlying erythema or warmth to touch; leg remains NVI  Neurological: She is alert and oriented to person, place, and time.  Skin: Skin is warm and dry.  Psychiatric: She has a normal mood and affect.    ED Course  Procedures (including critical care time) Labs Review Labs Reviewed - No data to display  Imaging Review No results found.   EKG Interpretation None      MDM   Final diagnoses:  Hip pain, right  Knee pain, right   48 year old female with chronic right hip and knee pain, now with worsening pain after stopping her narcotics 2 weeks ago. She is followed by neurosurgery for these issues. On exam, she has no visible signs of trauma or septic joint.  No clinical signs of DVT and unlikely given that she is on chronic lovenox.  No new focal neurologic deficits noted on exam.  Given dose of norco, d/c home with same.  Will FU with PCP and neurosurgery.  Discussed plan with patient, he/she acknowledged understanding and agreed with plan of care.  Return precautions given for new or worsening symptoms.  Larene Pickett, PA-C 06/21/14 [phone]

## 2014-06-21 NOTE — ED Notes (Signed)
Pt. reports chronic right hip and right knee pain got worse yesterday unrelieved by prescription Neurontin . Denies injury or fall / ambulatory using her cane.

## 2014-06-21 NOTE — Discharge Instructions (Signed)
Take the prescribed medication as directed. Follow-up with your PCP and/or neurosurgeon for further management. Return to the ED for new or worsening symptoms.

## 2014-06-27 NOTE — ED Provider Notes (Signed)
Medical screening examination/treatment/procedure(s) were performed by non-physician practitioner and as supervising physician I was immediately available for consultation/collaboration.   EKG Interpretation None       Threasa Beards, MD 06/27/14 1504

## 2014-07-01 ENCOUNTER — Other Ambulatory Visit: Payer: Self-pay | Admitting: Internal Medicine

## 2014-07-06 ENCOUNTER — Telehealth: Payer: Self-pay | Admitting: *Deleted

## 2014-07-06 MED ORDER — METFORMIN HCL 500 MG PO TABS: ORAL_TABLET | ORAL | Status: DC

## 2014-07-06 NOTE — Telephone Encounter (Signed)
Received fax for insurance to pay for medication pt needs a #90. Sent to pharmacy. Spoke to pt- She states that she is establishing care with Dr. Vertell Limber and he will be managing her care from now on. Her first visit with him is not until October 27th. She will run out of her medication prior to that appt.

## 2014-07-12 ENCOUNTER — Encounter (HOSPITAL_COMMUNITY): Payer: Self-pay | Admitting: Emergency Medicine

## 2014-07-12 ENCOUNTER — Emergency Department (HOSPITAL_COMMUNITY)
Admission: EM | Admit: 2014-07-12 | Discharge: 2014-07-12 | Disposition: A | Discharge status: Home / Self Care | Payer: Managed Care, Other (non HMO) | Attending: Emergency Medicine | Admitting: Emergency Medicine

## 2014-07-12 DIAGNOSIS — Z8619 Personal history of other infectious and parasitic diseases: Secondary | ICD-10-CM | POA: Diagnosis not present

## 2014-07-12 DIAGNOSIS — Z8709 Personal history of other diseases of the respiratory system: Secondary | ICD-10-CM | POA: Insufficient documentation

## 2014-07-12 DIAGNOSIS — Z9889 Other specified postprocedural states: Secondary | ICD-10-CM | POA: Insufficient documentation

## 2014-07-12 DIAGNOSIS — Z8719 Personal history of other diseases of the digestive system: Secondary | ICD-10-CM | POA: Insufficient documentation

## 2014-07-12 DIAGNOSIS — G8929 Other chronic pain: Secondary | ICD-10-CM | POA: Diagnosis not present

## 2014-07-12 DIAGNOSIS — Z86718 Personal history of other venous thrombosis and embolism: Secondary | ICD-10-CM | POA: Diagnosis not present

## 2014-07-12 DIAGNOSIS — Z7901 Long term (current) use of anticoagulants: Secondary | ICD-10-CM | POA: Insufficient documentation

## 2014-07-12 DIAGNOSIS — F17200 Nicotine dependence, unspecified, uncomplicated: Secondary | ICD-10-CM | POA: Insufficient documentation

## 2014-07-12 DIAGNOSIS — Z9010 Acquired absence of unspecified breast and nipple: Secondary | ICD-10-CM | POA: Insufficient documentation

## 2014-07-12 DIAGNOSIS — Z853 Personal history of malignant neoplasm of breast: Secondary | ICD-10-CM | POA: Diagnosis not present

## 2014-07-12 DIAGNOSIS — N644 Mastodynia: Secondary | ICD-10-CM | POA: Diagnosis present

## 2014-07-12 DIAGNOSIS — E119 Type 2 diabetes mellitus without complications: Secondary | ICD-10-CM | POA: Insufficient documentation

## 2014-07-12 DIAGNOSIS — Z79899 Other long term (current) drug therapy: Secondary | ICD-10-CM | POA: Insufficient documentation

## 2014-07-12 DIAGNOSIS — N63 Unspecified lump in unspecified breast: Secondary | ICD-10-CM | POA: Diagnosis not present

## 2014-07-12 MED ORDER — LORAZEPAM 1 MG PO TABS
1.0000 mg | ORAL_TABLET | Freq: Once | ORAL | Status: AC
  Administered 2014-07-12: 1 mg via ORAL
  Filled 2014-07-12: qty 1

## 2014-07-12 NOTE — ED Provider Notes (Signed)
CSN: [id number]     Arrival date & time 07/12/14  1954 History  This chart was scribed for Sherry Creamer, NP working with Sherry Patches, MD by Evelene Croon, ED Scribe. This patient was seen in room WTR8/WTR8 and the patient's care was started at 9:13 PM.    Chief Complaint  Patient presents with  . Breast Pain    The history is provided by the patient. No language interpreter was used.   HPI Comments:  Sherry [name] is a 48 y.o. female with a h/o right mastectomy, who presents to the Emergency Department complaining of moderate constant left breast pain for a few days. She notes a lump to the area. She also reports a h/o left breast reduction. No alleviating factors or associated symptoms noted.   Past Medical History  Diagnosis Date  . Vitamin D deficiency   . Gallstones   . Breast cancer 2010    s/p mastectomy  . Sinus infection     pt is on Doxycycline  . History of blood clots     neck-after reconstructive surgery  . Chronic back pain     bulding/herniated disc  . Hemorrhoids   . Constipation     related to pain meds and takes stool softener prn  . Nocturia   . Diabetes mellitus     takes Saxagliptin-Metformin daily  . Lyme disease     taking Doxycycline-to finish up 06/03/12  . Frozen shoulder   . DVT (deep venous thrombosis) 08/13/2013    R subclavian vein RUE.  . DVT of right axillary vein, acute 11/09/2013   Past Surgical History  Procedure Laterality Date  . Cesarean section  1992/1995  . Mastectomy  04/27/10    right  . Breast surgery    . Breast reconstruction  7/12  . Port a cath placed  2011  . Port a cath removed    . Lumbar laminectomy/decompression microdiscectomy  06/04/2012    Procedure: LUMBAR LAMINECTOMY/DECOMPRESSION MICRODISCECTOMY 1 LEVEL;  Surgeon: Melina Schools, MD;  Location: [facility];  Service: Orthopedics;  Laterality: N/A;  L4-5 BILATERAL HEMILAMINECTOMY/MICRODISCECTOMY  . Back surgery    . Tubal ligation     Family History  Problem  Relation Age of Onset  . Diabetes Mother   . Diabetes Sister    History  Substance Use Topics  . Smoking status: Current Every Day Smoker -- 1.00 packs/day for 28 years    Types: Cigarettes  . Smokeless tobacco: Never Used  . Alcohol Use: No   OB History   Grav Para Term Preterm Abortions TAB SAB Ect Mult Living                 Review of Systems  Respiratory: Negative for shortness of breath.   Musculoskeletal:       Breast pain   Skin: Negative for color change and wound.  All other systems reviewed and are negative.     Allergies  Tramadol  Home Medications   Prior to Admission medications   Medication Sig Start Date End Date Taking? Authorizing Provider  acetaminophen (TYLENOL) 500 MG tablet Take 500-1,000 mg by mouth every 6 (six) hours as needed for mild pain, moderate pain, fever or headache.    Historical Provider, MD  enoxaparin (LOVENOX) 120 MG/0.8ML injection Inject 0.8 mLs (120 mg total) into the skin daily. 08/15/13   Robyn Haber, MD  gabapentin (NEURONTIN) 100 MG capsule Take 100 mg by mouth 2 (two) times daily.    Historical  Provider, MD  HYDROcodone-acetaminophen (NORCO/VICODIN) 5-325 MG per tablet Take 1 tablet by mouth every 4 (four) hours as needed. 06/21/14   Larene Pickett, PA-C  metFORMIN (GLUCOPHAGE) 500 MG tablet TAKE 1 TABLET BY MOUTH TWICE DAILY WITH A MEAL. Need an office visit/labs for further refills 07/06/14   Chelle S Jeffery, PA-C  sitaGLIPtin (JANUVIA) 100 MG tablet Take 1 tablet (100 mg total) by mouth daily. 08/13/13   Orma Flaming, MD   BP 141/85  Pulse 111  Temp(Src) 99.1 F (37.3 C) (Oral)  Resp 14  SpO2 97%  LMP 06/27/2014 Physical Exam  Vitals reviewed. Constitutional: She appears well-developed and well-nourished.  HENT:  Head: Normocephalic.  Eyes: Pupils are equal, round, and reactive to light.  Neck: Normal range of motion.  Cardiovascular: Normal rate.   Pulmonary/Chest: Effort normal. She exhibits mass.    Skin:  Skin is warm. No erythema.    ED Course  Procedures   DIAGNOSTIC STUDIES:  Oxygen Saturation is 97% on RA, normal by my interpretation.    COORDINATION OF CARE:  9:17 PM Discussed treatment plan with pt at bedside and pt agreed to plan.  Labs Review Labs Reviewed - No data to display  Imaging Review No results found.   EKG Interpretation None     Will have patient seen at [facility] tomorrow for mammogram order placed Patient upset and crying  Given Ativan PO prior to DC MDM   Final diagnoses:  Breast mass      Garald Balding, NP 07/12/14 2137  Garald Balding, NP 07/12/14 2145

## 2014-07-12 NOTE — ED Notes (Signed)
Pt had a right mastectomy 6 years ago, today her left breast is sore and she feels lumps, no redness or warmth.

## 2014-07-12 NOTE — Discharge Instructions (Signed)
Please call the breast Center in the morning to schedule a STAT mammogram

## 2014-07-13 ENCOUNTER — Other Ambulatory Visit: Payer: Self-pay | Admitting: Hematology and Oncology

## 2014-07-13 ENCOUNTER — Ambulatory Visit
Admission: RE | Admit: 2014-07-13 | Discharge: 2014-07-13 | Disposition: A | Discharge status: Home / Self Care | Payer: 59 | Source: Ambulatory Visit | Attending: Hematology and Oncology | Admitting: Hematology and Oncology

## 2014-07-13 ENCOUNTER — Ambulatory Visit
Admission: RE | Admit: 2014-07-13 | Discharge: 2014-07-13 | Disposition: A | Discharge status: Home / Self Care | Payer: 59 | Source: Ambulatory Visit | Attending: Emergency Medicine | Admitting: Emergency Medicine

## 2014-07-13 DIAGNOSIS — N644 Mastodynia: Secondary | ICD-10-CM

## 2014-07-13 DIAGNOSIS — Z9889 Other specified postprocedural states: Secondary | ICD-10-CM

## 2014-07-13 DIAGNOSIS — N63 Unspecified lump in unspecified breast: Secondary | ICD-10-CM

## 2014-07-13 DIAGNOSIS — Z9011 Acquired absence of right breast and nipple: Secondary | ICD-10-CM

## 2014-07-13 NOTE — ED Provider Notes (Signed)
Medical screening examination/treatment/procedure(s) were performed by non-physician practitioner and as supervising physician I was immediately available for consultation/collaboration.  [name], MD 07/13/14 1435

## 2014-07-14 ENCOUNTER — Telehealth: Payer: Self-pay | Admitting: *Deleted

## 2014-07-14 NOTE — Telephone Encounter (Signed)
Pt states she doesn't need to f/u w/ Oncologist at this time until after she has Breast MRI.  She says she was told it is not likely cancer.  She will call back after the MRI.

## 2014-07-14 NOTE — Telephone Encounter (Signed)
Attempted to call pt regarding abnormal mammogram and Dr. Calton Dach note below.  Her phone is "not receiving messages."   Will try again later.

## 2014-07-14 NOTE — Telephone Encounter (Signed)
Who is ordering the MRI, I wonder? Never mind.

## 2014-07-14 NOTE — Telephone Encounter (Signed)
Message copied by Cathlean Cower on Thu Jul 14, 2014  1:31 PM ------      Message from: [facility] [place]      Created: Thu Jul 14, 2014 10:17 AM      Regarding: patient non-compliant       She was no- show return visit now with abnormal mammo.      Does she want to see me back or see someone at the breast center?      ----- Message -----         From: Rad Results In Interface         Sent: 07/13/2014   5:03 PM           To: Heath Lark, MD                   ------

## 2014-07-26 ENCOUNTER — Emergency Department (HOSPITAL_COMMUNITY)
Admission: EM | Admit: 2014-07-26 | Discharge: 2014-07-26 | Disposition: A | Discharge status: Home / Self Care | Payer: Managed Care, Other (non HMO) | Attending: Emergency Medicine | Admitting: Emergency Medicine

## 2014-07-26 ENCOUNTER — Encounter (HOSPITAL_COMMUNITY): Payer: Self-pay | Admitting: Emergency Medicine

## 2014-07-26 DIAGNOSIS — B9689 Other specified bacterial agents as the cause of diseases classified elsewhere: Secondary | ICD-10-CM

## 2014-07-26 DIAGNOSIS — Z86718 Personal history of other venous thrombosis and embolism: Secondary | ICD-10-CM | POA: Diagnosis not present

## 2014-07-26 DIAGNOSIS — Z8709 Personal history of other diseases of the respiratory system: Secondary | ICD-10-CM | POA: Insufficient documentation

## 2014-07-26 DIAGNOSIS — Z7901 Long term (current) use of anticoagulants: Secondary | ICD-10-CM | POA: Diagnosis not present

## 2014-07-26 DIAGNOSIS — Z853 Personal history of malignant neoplasm of breast: Secondary | ICD-10-CM | POA: Diagnosis not present

## 2014-07-26 DIAGNOSIS — N760 Acute vaginitis: Secondary | ICD-10-CM | POA: Diagnosis not present

## 2014-07-26 DIAGNOSIS — Z720 Tobacco use: Secondary | ICD-10-CM | POA: Insufficient documentation

## 2014-07-26 DIAGNOSIS — Z79899 Other long term (current) drug therapy: Secondary | ICD-10-CM | POA: Insufficient documentation

## 2014-07-26 DIAGNOSIS — N3000 Acute cystitis without hematuria: Secondary | ICD-10-CM

## 2014-07-26 DIAGNOSIS — E119 Type 2 diabetes mellitus without complications: Secondary | ICD-10-CM | POA: Diagnosis not present

## 2014-07-26 DIAGNOSIS — Z8739 Personal history of other diseases of the musculoskeletal system and connective tissue: Secondary | ICD-10-CM | POA: Diagnosis not present

## 2014-07-26 DIAGNOSIS — Z8619 Personal history of other infectious and parasitic diseases: Secondary | ICD-10-CM | POA: Insufficient documentation

## 2014-07-26 DIAGNOSIS — G8929 Other chronic pain: Secondary | ICD-10-CM | POA: Insufficient documentation

## 2014-07-26 DIAGNOSIS — Z3202 Encounter for pregnancy test, result negative: Secondary | ICD-10-CM | POA: Diagnosis not present

## 2014-07-26 DIAGNOSIS — Z9889 Other specified postprocedural states: Secondary | ICD-10-CM | POA: Diagnosis not present

## 2014-07-26 DIAGNOSIS — Z8719 Personal history of other diseases of the digestive system: Secondary | ICD-10-CM | POA: Diagnosis not present

## 2014-07-26 DIAGNOSIS — R1084 Generalized abdominal pain: Secondary | ICD-10-CM | POA: Diagnosis present

## 2014-07-26 LAB — URINALYSIS, ROUTINE W REFLEX MICROSCOPIC
BILIRUBIN URINE: NEGATIVE
HGB URINE DIPSTICK: NEGATIVE
Ketones, ur: NEGATIVE mg/dL
Leukocytes, UA: NEGATIVE
Nitrite: NEGATIVE
PROTEIN: NEGATIVE mg/dL
Specific Gravity, Urine: 1.021 (ref 1.005–1.030)
Urobilinogen, UA: 1 mg/dL (ref 0.0–1.0)
pH: 6.5 (ref 5.0–8.0)

## 2014-07-26 LAB — URINE MICROSCOPIC-ADD ON

## 2014-07-26 LAB — COMPREHENSIVE METABOLIC PANEL
ALT: 17 U/L (ref 0–35)
ANION GAP: 15 (ref 5–15)
AST: 15 U/L (ref 0–37)
Albumin: 4 g/dL (ref 3.5–5.2)
Alkaline Phosphatase: 106 U/L (ref 39–117)
BILIRUBIN TOTAL: 0.2 mg/dL — AB (ref 0.3–1.2)
BUN: 9 mg/dL (ref 6–23)
CALCIUM: 9.8 mg/dL (ref 8.4–10.5)
CO2: 23 mEq/L (ref 19–32)
CREATININE: 0.35 mg/dL — AB (ref 0.50–1.10)
Chloride: 103 mEq/L (ref 96–112)
GFR calc Af Amer: >90 mL/min (ref >90)
GFR calc non Af Amer: >90 mL/min (ref >90)
Glucose, Bld: 161 mg/dL — ABNORMAL HIGH (ref 70–99)
Potassium: 4 mEq/L (ref 3.7–5.3)
Sodium: 141 mEq/L (ref 137–147)
Total Protein: 8.2 g/dL (ref 6.0–8.3)

## 2014-07-26 LAB — CBC WITH DIFFERENTIAL/PLATELET
BASOS ABS: 0 10*3/uL (ref 0.0–0.1)
Basophils Relative: 0 % (ref 0–1)
EOS PCT: 1 % (ref 0–5)
Eosinophils Absolute: 0.2 10*3/uL (ref 0.0–0.7)
HEMATOCRIT: 42.3 % (ref 36.0–46.0)
HEMOGLOBIN: 14.9 g/dL (ref 12.0–15.0)
Lymphocytes Relative: 30 % (ref 12–46)
Lymphs Abs: 3.7 10*3/uL (ref 0.7–4.0)
MCH: 32.3 pg (ref 26.0–34.0)
MCHC: 35.2 g/dL (ref 30.0–36.0)
MCV: 91.8 fL (ref 78.0–100.0)
MONO ABS: 0.9 10*3/uL (ref 0.1–1.0)
MONOS PCT: 7 % (ref 3–12)
Neutro Abs: 7.6 10*3/uL (ref 1.7–7.7)
Neutrophils Relative %: 62 % (ref 43–77)
Platelets: 295 10*3/uL (ref 150–400)
RBC: 4.61 MIL/uL (ref 3.87–5.11)
RDW: 12.5 % (ref 11.5–15.5)
WBC: 12.4 10*3/uL — ABNORMAL HIGH (ref 4.0–10.5)

## 2014-07-26 LAB — WET PREP, GENITAL
Trich, Wet Prep: NONE SEEN
Yeast Wet Prep HPF POC: NONE SEEN

## 2014-07-26 LAB — PREGNANCY, URINE: PREG TEST UR: NEGATIVE

## 2014-07-26 LAB — LIPASE, BLOOD: LIPASE: 30 U/L (ref 11–59)

## 2014-07-26 MED ORDER — CEPHALEXIN 500 MG PO CAPS
500.0000 mg | ORAL_CAPSULE | Freq: Once | ORAL | Status: AC
  Administered 2014-07-26: 500 mg via ORAL
  Filled 2014-07-26: qty 1

## 2014-07-26 MED ORDER — METRONIDAZOLE 500 MG PO TABS
500.0000 mg | ORAL_TABLET | Freq: Once | ORAL | Status: AC
  Administered 2014-07-26: 500 mg via ORAL
  Filled 2014-07-26: qty 1

## 2014-07-26 MED ORDER — TAPENTADOL HCL 100 MG PO TABS: 100.0000 mg | ORAL_TABLET | Freq: Two times a day (BID) | ORAL | Status: AC | PRN

## 2014-07-26 MED ORDER — METRONIDAZOLE 500 MG PO TABS: 500.0000 mg | ORAL_TABLET | Freq: Two times a day (BID) | ORAL | Status: AC

## 2014-07-26 MED ORDER — CEPHALEXIN 500 MG PO CAPS: 500.0000 mg | ORAL_CAPSULE | Freq: Two times a day (BID) | ORAL | Status: AC

## 2014-07-26 MED ORDER — OXYCODONE HCL 5 MG PO TABS
10.0000 mg | ORAL_TABLET | Freq: Once | ORAL | Status: AC
  Administered 2014-07-26: 10 mg via ORAL
  Filled 2014-07-26: qty 2

## 2014-07-26 NOTE — ED Notes (Signed)
Pt c/o lower / pelvis pain that began on Sat; pt c/o urinary frequency; pt also c/o chronic rt hip that is radiating down rt leg; pt c/o rt leg feeling "restless"; pt states that she is out of her pain medication for her leg and her PCP is out of town; pt states that the medication she is taking for her leg is Nucynta

## 2014-07-26 NOTE — ED Provider Notes (Signed)
CSN: [id number]     Arrival date & time 07/26/14  0033 History   First MD Initiated Contact with Patient 07/26/14 0259     Chief Complaint  Patient presents with  . Abdominal Pain     (Consider location/radiation/quality/duration/timing/severity/associated sxs/prior Treatment) HPI  Sherry [name] is a 48 y.o. female with past medical history of diabetes, breast cancer status post removal, DVT currently on Lovenox coming in with abdominal pain. Pain is suprapubic and is going on for 2 days. She can't describe how it feels the side, hip pain. She denies any radiation. She's had dysuria and increased frequency. She denies any hematuria. She's had no fevers. Her last menstrual cycle was September 18 and it was slightly longer than normal. She denies vaginal discharge. Patient has no chest pain or shortness of breath. Nothing makes the symptoms better or worse. Patient has no further complaints.  Past Medical History  Diagnosis Date  . Vitamin D deficiency   . Gallstones   . Breast cancer 2010    s/p mastectomy  . Sinus infection     pt is on Doxycycline  . History of blood clots     neck-after reconstructive surgery  . Chronic back pain     bulding/herniated disc  . Hemorrhoids   . Constipation     related to pain meds and takes stool softener prn  . Nocturia   . Diabetes mellitus     takes Saxagliptin-Metformin daily  . Lyme disease     taking Doxycycline-to finish up 06/03/12  . Frozen shoulder   . DVT (deep venous thrombosis) 08/13/2013    R subclavian vein RUE.  . DVT of right axillary vein, acute 11/09/2013   Past Surgical History  Procedure Laterality Date  . Cesarean section  1992/1995  . Mastectomy  04/27/10    right  . Breast surgery    . Breast reconstruction  7/12  . Port a cath placed  2011  . Port a cath removed    . Lumbar laminectomy/decompression microdiscectomy  06/04/2012    Procedure: LUMBAR LAMINECTOMY/DECOMPRESSION MICRODISCECTOMY 1 LEVEL;  Surgeon:  Melina Schools, MD;  Location: [facility];  Service: Orthopedics;  Laterality: N/A;  L4-5 BILATERAL HEMILAMINECTOMY/MICRODISCECTOMY  . Back surgery    . Tubal ligation     Family History  Problem Relation Age of Onset  . Diabetes Mother   . Diabetes Sister    History  Substance Use Topics  . Smoking status: Current Every Day Smoker -- 1.00 packs/day for 28 years    Types: Cigarettes  . Smokeless tobacco: Never Used  . Alcohol Use: No   OB History   Grav Para Term Preterm Abortions TAB SAB Ect Mult Living                 Review of Systems    Allergies  Tramadol  Home Medications   Prior to Admission medications   Medication Sig Start Date End Date Taking? Authorizing Provider  enoxaparin (LOVENOX) 120 MG/0.8ML injection Inject 0.8 mLs (120 mg total) into the skin daily. 08/15/13  Yes Robyn Haber, MD  gabapentin (NEURONTIN) 100 MG capsule Take 100 mg by mouth 2 (two) times daily.   Yes Historical Provider, MD  metFORMIN (GLUCOPHAGE) 500 MG tablet Take 500 mg by mouth 2 (two) times daily with a meal.   Yes Historical Provider, MD  sitaGLIPtin (JANUVIA) 100 MG tablet Take 1 tablet (100 mg total) by mouth daily. 08/13/13  Yes Orma Flaming, MD  Tapentadol HCl (NUCYNTA) 100 MG TABS Take 1 tablet by mouth 2 (two) times daily.   Yes Historical Provider, MD   BP 119/73  Pulse 94  Temp(Src) 98 F (36.7 C) (Oral)  Resp 18  Ht 5\' 2"  (1.575 m)  Wt 145 lb (65.772 kg)  BMI 26.51 kg/m2  SpO2 98%  LMP 06/27/2014 Physical Exam  Nursing note and vitals reviewed. Constitutional: She is oriented to person, place, and time. She appears well-developed and well-nourished. No distress.  HENT:  Head: Normocephalic and atraumatic.  Nose: Nose normal.  Mouth/Throat: Oropharynx is clear and moist. No oropharyngeal exudate.  Eyes: Conjunctivae and EOM are normal. Pupils are equal, round, and reactive to light. No scleral icterus.  Neck: Normal range of motion. Neck supple. No JVD present. No  tracheal deviation present. No thyromegaly present.  Cardiovascular: Normal rate, regular rhythm and normal heart sounds.  Exam reveals no gallop and no friction rub.   No murmur heard. Pulmonary/Chest: Effort normal and breath sounds normal. No respiratory distress. She has no wheezes. She exhibits no tenderness.  Abdominal: Soft. Bowel sounds are normal. She exhibits no distension and no mass. There is tenderness. There is no rebound and no guarding.  Suprapubic and right lower quadrant tenderness to palpation  Genitourinary: Vagina normal. No vaginal discharge found.  No CMT, no adnexal tenderness.  Musculoskeletal: Normal range of motion. She exhibits no edema and no tenderness.  Lymphadenopathy:    She has no cervical adenopathy.  Neurological: She is alert and oriented to person, place, and time.  Skin: Skin is warm and dry. No rash noted. No erythema. No pallor.    ED Course  Procedures (including critical care time) Labs Review Labs Reviewed  WET PREP, GENITAL - Abnormal; Notable for the following:    Clue Cells Wet Prep HPF POC MANY (*)    WBC, Wet Prep HPF POC FEW (*)    All other components within normal limits  URINALYSIS, ROUTINE W REFLEX MICROSCOPIC - Abnormal; Notable for the following:    APPearance CLOUDY (*)    Glucose, UA >1000 (*)    All other components within normal limits  CBC WITH DIFFERENTIAL - Abnormal; Notable for the following:    WBC 12.4 (*)    All other components within normal limits  COMPREHENSIVE METABOLIC PANEL - Abnormal; Notable for the following:    Glucose, Bld 161 (*)    Creatinine, Ser 0.35 (*)    Total Bilirubin 0.2 (*)    All other components within normal limits  URINE MICROSCOPIC-ADD ON - Abnormal; Notable for the following:    Bacteria, UA FEW (*)    Crystals CA OXALATE CRYSTALS (*)    All other components within normal limits  GC/CHLAMYDIA PROBE AMP  LIPASE, BLOOD  PREGNANCY, URINE    Imaging Review No results found.   EKG  Interpretation None      MDM   Final diagnoses:  None    Patient presented emergency department for abdominal pain. Pelvic exam reveals many clue cells, urinalysis reveals a possible UTI. Patient is symptomatic with significant glucose in her urine so we will treat. She is advised to followup with her primary care physician within 3 days. Her vital signs remain within her normal limits and she is safe for discharge.    Everlene Balls, MD 07/26/14 [phone]

## 2014-07-26 NOTE — Discharge Instructions (Signed)
Bacterial Vaginosis Sherry [name], you were seen today for abdominal pain. You have a urinary tract infection as well as bacterial vaginosis. Take antibiotics as prescribed and followup with her primary care physician within 3 days. If any symptoms worsen come back to emergency department for repeat evaluation.  Thank you. Bacterial vaginosis is an infection of the vagina. It happens when too many of certain germs (bacteria) grow in the vagina. HOME CARE  Take your medicine as told by your doctor.  Finish your medicine even if you start to feel better.  Do not have sex until you finish your medicine and are better.  Tell your sex partner that you have an infection. They should see their doctor for treatment.  Practice safe sex. Use condoms. Have only one sex partner. GET HELP IF:  You are not getting better after 3 days of treatment.  You have more grey fluid (discharge) coming from your vagina than before.  You have more pain than before.  You have a fever. MAKE SURE YOU:   Understand these instructions.  Will watch your condition.  Will get help right away if you are not doing well or get worse. Document Released: 07/09/2008 Document Revised: 07/21/2013 Document Reviewed: 05/12/2013 [facility] Patient Information 2015 [place], [place]. This information is not intended to replace advice given to you by your health care provider. Make sure you discuss any questions you have with your health care provider. Urinary Tract Infection A urinary tract infection (UTI) can occur any place along the urinary tract. The tract includes the kidneys, ureters, bladder, and urethra. A type of germ called bacteria often causes a UTI. UTIs are often helped with antibiotic medicine.  HOME CARE   If given, take antibiotics as told by your doctor. Finish them even if you start to feel better.  Drink enough fluids to keep your pee (urine) clear or pale yellow.  Avoid tea, drinks with caffeine, and bubbly  (carbonated) drinks.  Pee often. Avoid holding your pee in for a long time.  Pee before and after having sex (intercourse).  Wipe from front to back after you poop (bowel movement) if you are a woman. Use each tissue only once. GET HELP RIGHT AWAY IF:   You have back pain.  You have lower belly (abdominal) pain.  You have chills.  You feel sick to your stomach (nauseous).  You throw up (vomit).  Your burning or discomfort with peeing does not go away.  You have a fever.  Your symptoms are not better in 3 days. MAKE SURE YOU:   Understand these instructions.  Will watch your condition.  Will get help right away if you are not doing well or get worse. Document Released: 03/18/2008 Document Revised: 06/24/2012 Document Reviewed: 04/30/2012 [facility] Patient Information 2015 [place], [place]. This information is not intended to replace advice given to you by your health care provider. Make sure you discuss any questions you have with your health care provider.

## 2014-07-27 LAB — GC/CHLAMYDIA PROBE AMP
CT Probe RNA: NEGATIVE
GC PROBE AMP APTIMA: NEGATIVE

## 2014-07-30 ENCOUNTER — Other Ambulatory Visit: Payer: Self-pay | Admitting: Family Medicine

## 2014-08-19 ENCOUNTER — Other Ambulatory Visit: Payer: Self-pay | Admitting: Family Medicine

## 2014-08-19 DIAGNOSIS — M79621 Pain in right upper arm: Secondary | ICD-10-CM

## 2014-08-19 DIAGNOSIS — Z86718 Personal history of other venous thrombosis and embolism: Secondary | ICD-10-CM

## 2014-08-24 ENCOUNTER — Ambulatory Visit
Admission: RE | Admit: 2014-08-24 | Discharge: 2014-08-24 | Disposition: A | Discharge status: Home / Self Care | Payer: 59 | Source: Ambulatory Visit | Attending: Family Medicine | Admitting: Family Medicine

## 2014-08-24 DIAGNOSIS — M79621 Pain in right upper arm: Secondary | ICD-10-CM

## 2014-08-24 DIAGNOSIS — Z86718 Personal history of other venous thrombosis and embolism: Secondary | ICD-10-CM

## 2014-10-19 ENCOUNTER — Emergency Department (HOSPITAL_COMMUNITY)
Admission: EM | Admit: 2014-10-19 | Discharge: 2014-10-20 | Disposition: A | Discharge status: Home / Self Care | Payer: Managed Care, Other (non HMO) | Attending: Emergency Medicine | Admitting: Emergency Medicine

## 2014-10-19 ENCOUNTER — Encounter (HOSPITAL_COMMUNITY): Payer: Self-pay

## 2014-10-19 DIAGNOSIS — Z853 Personal history of malignant neoplasm of breast: Secondary | ICD-10-CM | POA: Diagnosis not present

## 2014-10-19 DIAGNOSIS — Y9289 Other specified places as the place of occurrence of the external cause: Secondary | ICD-10-CM | POA: Diagnosis not present

## 2014-10-19 DIAGNOSIS — Y9389 Activity, other specified: Secondary | ICD-10-CM | POA: Insufficient documentation

## 2014-10-19 DIAGNOSIS — W228XXA Striking against or struck by other objects, initial encounter: Secondary | ICD-10-CM | POA: Insufficient documentation

## 2014-10-19 DIAGNOSIS — Z8719 Personal history of other diseases of the digestive system: Secondary | ICD-10-CM | POA: Insufficient documentation

## 2014-10-19 DIAGNOSIS — Z8739 Personal history of other diseases of the musculoskeletal system and connective tissue: Secondary | ICD-10-CM | POA: Insufficient documentation

## 2014-10-19 DIAGNOSIS — Z792 Long term (current) use of antibiotics: Secondary | ICD-10-CM | POA: Insufficient documentation

## 2014-10-19 DIAGNOSIS — Z720 Tobacco use: Secondary | ICD-10-CM | POA: Diagnosis not present

## 2014-10-19 DIAGNOSIS — S90121A Contusion of right lesser toe(s) without damage to nail, initial encounter: Secondary | ICD-10-CM | POA: Insufficient documentation

## 2014-10-19 DIAGNOSIS — G8929 Other chronic pain: Secondary | ICD-10-CM | POA: Diagnosis not present

## 2014-10-19 DIAGNOSIS — R269 Unspecified abnormalities of gait and mobility: Secondary | ICD-10-CM | POA: Insufficient documentation

## 2014-10-19 DIAGNOSIS — E119 Type 2 diabetes mellitus without complications: Secondary | ICD-10-CM | POA: Insufficient documentation

## 2014-10-19 DIAGNOSIS — Z7901 Long term (current) use of anticoagulants: Secondary | ICD-10-CM | POA: Diagnosis not present

## 2014-10-19 DIAGNOSIS — Z8709 Personal history of other diseases of the respiratory system: Secondary | ICD-10-CM | POA: Insufficient documentation

## 2014-10-19 DIAGNOSIS — Z79899 Other long term (current) drug therapy: Secondary | ICD-10-CM | POA: Diagnosis not present

## 2014-10-19 DIAGNOSIS — Z8619 Personal history of other infectious and parasitic diseases: Secondary | ICD-10-CM | POA: Insufficient documentation

## 2014-10-19 DIAGNOSIS — S99921A Unspecified injury of right foot, initial encounter: Secondary | ICD-10-CM | POA: Diagnosis present

## 2014-10-19 DIAGNOSIS — Z86718 Personal history of other venous thrombosis and embolism: Secondary | ICD-10-CM | POA: Diagnosis not present

## 2014-10-19 DIAGNOSIS — Y998 Other external cause status: Secondary | ICD-10-CM | POA: Diagnosis not present

## 2014-10-19 MED ORDER — OXYCODONE-ACETAMINOPHEN 5-325 MG PO TABS
1.0000 | ORAL_TABLET | Freq: Once | ORAL | Status: AC
  Administered 2014-10-20: 1 via ORAL
  Filled 2014-10-19: qty 1

## 2014-10-19 NOTE — ED Provider Notes (Signed)
CSN: [id number]     Arrival date & time 10/19/14  2214 History  This chart was scribed for Charlann Lange, PA-C, working with Mirna Mires, MD by Marti Sleigh, ED Scribe. This patient was seen in room WTR7/WTR7 and the patient's care was started at 11:04 PM.     Chief Complaint  Patient presents with  . Toe Injury    HPI  HPI Comments: Sherry [name] is a 49 y.o. female who presents to the Emergency Department complaining of right fifth toe pain after a toe injury that happened earlier today. Pt states she tried to step over a child gate in a doorway, and didn't lift her foot high enough, catching her toe on the edge. Pt states that she has a weak right hip. Pt endorses swelling and color change. Pt states she would like a post surgery boot for her foot.   Past Medical History  Diagnosis Date  . Vitamin D deficiency   . Gallstones   . Breast cancer 2010    s/p mastectomy  . Sinus infection     pt is on Doxycycline  . History of blood clots     neck-after reconstructive surgery  . Chronic back pain     bulding/herniated disc  . Hemorrhoids   . Constipation     related to pain meds and takes stool softener prn  . Nocturia   . Diabetes mellitus     takes Saxagliptin-Metformin daily  . Lyme disease     taking Doxycycline-to finish up 06/03/12  . Frozen shoulder   . DVT (deep venous thrombosis) 08/13/2013    R subclavian vein RUE.  . DVT of right axillary vein, acute 11/09/2013   Past Surgical History  Procedure Laterality Date  . Cesarean section  1992/1995  . Mastectomy  04/27/10    right  . Breast surgery    . Breast reconstruction  7/12  . Port a cath placed  2011  . Port a cath removed    . Lumbar laminectomy/decompression microdiscectomy  06/04/2012    Procedure: LUMBAR LAMINECTOMY/DECOMPRESSION MICRODISCECTOMY 1 LEVEL;  Surgeon: Melina Schools, MD;  Location: [place];  Service: Orthopedics;  Laterality: N/A;  L4-5 BILATERAL HEMILAMINECTOMY/MICRODISCECTOMY  . Back surgery     . Tubal ligation     Family History  Problem Relation Age of Onset  . Diabetes Mother   . Diabetes Sister    History  Substance Use Topics  . Smoking status: Current Every Day Smoker -- 1.00 packs/day for 28 years    Types: Cigarettes  . Smokeless tobacco: Never Used  . Alcohol Use: No   OB History    No data available     Review of Systems  Cardiovascular: Negative for leg swelling.  Musculoskeletal: Positive for joint swelling and gait problem.  Skin: Positive for color change.    Allergies  Tramadol  Home Medications   Prior to Admission medications   Medication Sig Start Date End Date Taking? Authorizing Provider  cephALEXin (KEFLEX) 500 MG capsule Take 1 capsule (500 mg total) by mouth 2 (two) times daily. 07/26/14   Everlene Balls, MD  enoxaparin (LOVENOX) 120 MG/0.8ML injection INJECT 0.8MLS INTO THE SKIN EVERY DAY 08/02/14   Robyn Haber, MD  gabapentin (NEURONTIN) 100 MG capsule Take 100 mg by mouth 2 (two) times daily.    Historical Provider, MD  metFORMIN (GLUCOPHAGE) 500 MG tablet Take 500 mg by mouth 2 (two) times daily with a meal.    Historical Provider,  MD  metroNIDAZOLE (FLAGYL) 500 MG tablet Take 1 tablet (500 mg total) by mouth 2 (two) times daily. One po bid x 7 days 07/26/14   Everlene Balls, MD  sitaGLIPtin (JANUVIA) 100 MG tablet Take 1 tablet (100 mg total) by mouth daily. 08/13/13   Orma Flaming, MD  Tapentadol HCl (NUCYNTA) 100 MG TABS Take 1 tablet by mouth 2 (two) times daily.    Historical Provider, MD  Tapentadol HCl (NUCYNTA) 100 MG TABS Take 1 tablet (100 mg total) by mouth 2 (two) times daily as needed (severe pain). 07/26/14   Everlene Balls, MD   BP 140/79 mmHg  Pulse 105  Temp(Src) 98.7 F (37.1 C) (Oral)  Resp 18  Ht 5\' 2"  (1.575 m)  Wt 147 lb (66.679 kg)  BMI 26.88 kg/m2  SpO2 97%  LMP 10/02/2014 Physical Exam  Constitutional: She is oriented to person, place, and time. She appears well-developed and well-nourished.  HENT:  Head:  Normocephalic and atraumatic.  Eyes: Pupils are equal, round, and reactive to light.  Neck: Neck supple.  Cardiovascular: Normal rate and regular rhythm.   Pulmonary/Chest: Effort normal and breath sounds normal. No respiratory distress.  Musculoskeletal: She exhibits edema and tenderness.  Right fifth toe swollen and ecchymotic without bony deformities. No metatarsal tenderness or deformity.   Neurological: She is alert and oriented to person, place, and time.  Skin: Skin is warm and dry.  Psychiatric: She has a normal mood and affect. Her behavior is normal.  Nursing note and vitals reviewed.   ED Course  Procedures  DIAGNOSTIC STUDIES: Oxygen Saturation is 97% on RA, normal by my interpretation.    COORDINATION OF CARE: 11:07 PM Discussed treatment plan with pt at bedside and pt agreed to plan.  Labs Review Labs Reviewed - No data to display  Imaging Review No results found.   EKG Interpretation None      MDM   Final diagnoses:  None    1. Toe injury  Discussed imaging with patient as unnecessary as it would not change the treatment plant. Patient comfortable with no x-ray. Toes buddy taped, post-op shoe and crutches provided. She is under Pain Mgmt contract - no additional pain Rx's provided.   I personally performed the services described in this documentation, which was scribed in my presence. The recorded information has been reviewed and is accurate.     Dewaine Oats, PA-C 10/20/14 [place], MD 10/21/14 [phone]

## 2014-10-19 NOTE — ED Notes (Signed)
Pt tried to step over a baby gate and injured her right baby toe

## 2014-10-20 NOTE — Discharge Instructions (Signed)
Buddy Taping of Toes We have taped your toes together to keep them from moving. This is called "buddy taping" since we used a part of your own body to keep the injured part still. We placed soft padding between your toes to keep them from rubbing against each other. Buddy taping will help with healing and to reduce pain. Keep your toes buddy taped together for as long as directed by your caregiver. HOME CARE INSTRUCTIONS   Raise your injured area above the level of your heart while sitting or lying down. Prop it up with pillows.  An ice pack used every twenty minutes, while awake, for the first one to two days may be helpful. Put ice in a plastic bag and put a towel between the bag and your skin.  Watch for signs that the taping is too tight. These signs may be:  Numbness of your taped toes.  Coolness of your taped toes.  Color change in the area beyond the tape.  Increased pain.  If you have any of these signs, loosen or rewrap the tape. If you need to loosen or rewrap the buddy tape, make sure you use the padding again. SEEK IMMEDIATE MEDICAL CARE IF:   You have worse pain, swelling, inflammation (soreness), drainage or bleeding after you rewrap the tape.  Any new problems occur. MAKE SURE YOU:   Understand these instructions.  Will watch your condition.  Will get help right away if you are not doing well or get worse. Document Released: 07/04/2004 Document Revised: 12/23/2011 Document Reviewed: 09/27/2008 [facility] Patient Information 2015 [place], [place]. This information is not intended to replace advice given to you by your health care provider. Make sure you discuss any questions you have with your health care provider. Cryotherapy Cryotherapy means treatment with cold. Ice or gel packs can be used to reduce both pain and swelling. Ice is the most helpful within the first 24 to 48 hours after an injury or flare-up from overusing a muscle or joint. Sprains, strains, spasms,  burning pain, shooting pain, and aches can all be eased with ice. Ice can also be used when recovering from surgery. Ice is effective, has very few side effects, and is safe for most people to use. PRECAUTIONS  Ice is not a safe treatment option for people with:  Raynaud phenomenon. This is a condition affecting small blood vessels in the extremities. Exposure to cold may cause your problems to return.  Cold hypersensitivity. There are many forms of cold hypersensitivity, including:  Cold urticaria. Red, itchy hives appear on the skin when the tissues begin to warm after being iced.  Cold erythema. This is a red, itchy rash caused by exposure to cold.  Cold hemoglobinuria. Red blood cells break down when the tissues begin to warm after being iced. The hemoglobin that carry oxygen are passed into the urine because they cannot combine with blood proteins fast enough.  Numbness or altered sensitivity in the area being iced. If you have any of the following conditions, do not use ice until you have discussed cryotherapy with your caregiver:  Heart conditions, such as arrhythmia, angina, or chronic heart disease.  High blood pressure.  Healing wounds or open skin in the area being iced.  Current infections.  Rheumatoid arthritis.  Poor circulation.  Diabetes. Ice slows the blood flow in the region it is applied. This is beneficial when trying to stop inflamed tissues from spreading irritating chemicals to surrounding tissues. However, if you expose your skin  to cold temperatures for too long or without the proper protection, you can damage your skin or nerves. Watch for signs of skin damage due to cold. HOME CARE INSTRUCTIONS Follow these tips to use ice and cold packs safely.  Place a dry or damp towel between the ice and skin. A damp towel will cool the skin more quickly, so you may need to shorten the time that the ice is used.  For a more rapid response, add gentle compression to  the ice.  Ice for no more than 10 to 20 minutes at a time. The bonier the area you are icing, the less time it will take to get the benefits of ice.  Check your skin after 5 minutes to make sure there are no signs of a poor response to cold or skin damage.  Rest 20 minutes or more between uses.  Once your skin is numb, you can end your treatment. You can test numbness by very lightly touching your skin. The touch should be so light that you do not see the skin dimple from the pressure of your fingertip. When using ice, most people will feel these normal sensations in this order: cold, burning, aching, and numbness.  Do not use ice on someone who cannot communicate their responses to pain, such as small children or people with dementia. HOW TO MAKE AN ICE PACK Ice packs are the most common way to use ice therapy. Other methods include ice massage, ice baths, and cryosprays. Muscle creams that cause a cold, tingly feeling do not offer the same benefits that ice offers and should not be used as a substitute unless recommended by your caregiver. To make an ice pack, do one of the following:  Place crushed ice or a bag of frozen vegetables in a sealable plastic bag. Squeeze out the excess air. Place this bag inside another plastic bag. Slide the bag into a pillowcase or place a damp towel between your skin and the bag.  Mix 3 parts water with 1 part rubbing alcohol. Freeze the mixture in a sealable plastic bag. When you remove the mixture from the freezer, it will be slushy. Squeeze out the excess air. Place this bag inside another plastic bag. Slide the bag into a pillowcase or place a damp towel between your skin and the bag. SEEK MEDICAL CARE IF:  You develop white spots on your skin. This may give the skin a blotchy (mottled) appearance.  Your skin turns blue or pale.  Your skin becomes waxy or hard.  Your swelling gets worse. MAKE SURE YOU:   Understand these instructions.  Will watch  your condition.  Will get help right away if you are not doing well or get worse. Document Released: 05/27/2011 Document Revised: 02/14/2014 Document Reviewed: 05/27/2011 [facility] Patient Information 2015 [place], [place]. This information is not intended to replace advice given to you by your health care provider. Make sure you discuss any questions you have with your health care provider.

## 2014-11-16 ENCOUNTER — Encounter (HOSPITAL_COMMUNITY): Payer: Self-pay | Admitting: Emergency Medicine

## 2014-11-16 ENCOUNTER — Emergency Department (HOSPITAL_COMMUNITY)
Admission: EM | Admit: 2014-11-16 | Discharge: 2014-11-16 | Disposition: A | Discharge status: Home / Self Care | Payer: Managed Care, Other (non HMO) | Attending: Emergency Medicine | Admitting: Emergency Medicine

## 2014-11-16 DIAGNOSIS — Z792 Long term (current) use of antibiotics: Secondary | ICD-10-CM | POA: Diagnosis not present

## 2014-11-16 DIAGNOSIS — Z853 Personal history of malignant neoplasm of breast: Secondary | ICD-10-CM | POA: Insufficient documentation

## 2014-11-16 DIAGNOSIS — Z86718 Personal history of other venous thrombosis and embolism: Secondary | ICD-10-CM | POA: Insufficient documentation

## 2014-11-16 DIAGNOSIS — Z8709 Personal history of other diseases of the respiratory system: Secondary | ICD-10-CM | POA: Insufficient documentation

## 2014-11-16 DIAGNOSIS — K002 Abnormalities of size and form of teeth: Secondary | ICD-10-CM | POA: Diagnosis not present

## 2014-11-16 DIAGNOSIS — Z79899 Other long term (current) drug therapy: Secondary | ICD-10-CM | POA: Diagnosis not present

## 2014-11-16 DIAGNOSIS — E119 Type 2 diabetes mellitus without complications: Secondary | ICD-10-CM | POA: Insufficient documentation

## 2014-11-16 DIAGNOSIS — K029 Dental caries, unspecified: Secondary | ICD-10-CM | POA: Diagnosis not present

## 2014-11-16 DIAGNOSIS — Z8639 Personal history of other endocrine, nutritional and metabolic disease: Secondary | ICD-10-CM | POA: Insufficient documentation

## 2014-11-16 DIAGNOSIS — K088 Other specified disorders of teeth and supporting structures: Secondary | ICD-10-CM | POA: Diagnosis present

## 2014-11-16 DIAGNOSIS — G8929 Other chronic pain: Secondary | ICD-10-CM | POA: Insufficient documentation

## 2014-11-16 DIAGNOSIS — Z720 Tobacco use: Secondary | ICD-10-CM | POA: Diagnosis not present

## 2014-11-16 DIAGNOSIS — K0889 Other specified disorders of teeth and supporting structures: Secondary | ICD-10-CM

## 2014-11-16 MED ORDER — BUPIVACAINE-EPINEPHRINE (PF) 0.5% -1:200000 IJ SOLN
1.8000 mL | Freq: Once | INTRAMUSCULAR | Status: AC
  Administered 2014-11-16: 1.8 mL
  Filled 2014-11-16: qty 1.8

## 2014-11-16 MED ORDER — AMOXICILLIN 500 MG PO CAPS
500.0000 mg | ORAL_CAPSULE | Freq: Once | ORAL | Status: AC
Start: 2014-11-16 — End: 2014-11-16
  Administered 2014-11-16: 500 mg via ORAL
  Filled 2014-11-16: qty 1

## 2014-11-16 MED ORDER — NAPROXEN 500 MG PO TABS: 500.0000 mg | ORAL_TABLET | Freq: Two times a day (BID) | ORAL | Status: AC

## 2014-11-16 MED ORDER — AMOXICILLIN 500 MG PO CAPS: 500.0000 mg | ORAL_CAPSULE | Freq: Three times a day (TID) | ORAL | Status: AC

## 2014-11-16 MED ORDER — BENZOCAINE 10 % MT GEL: 1.0000 "application " | OROMUCOSAL | Status: AC | PRN

## 2014-11-16 NOTE — ED Notes (Signed)
Pt states she is having left sided tooth pain which she believes is causing her to have a headache and her ear to hurt. Pt is on pain medication for back but states it is doing nothing for the pain.

## 2014-11-16 NOTE — ED Notes (Signed)
Pt not in the room

## 2014-11-16 NOTE — Discharge Instructions (Signed)
Dental Pain °A tooth ache may be caused by cavities (tooth decay). Cavities expose the nerve of the tooth to air and hot or cold temperatures. It may come from an infection or abscess (also called a boil or furuncle) around your tooth. It is also often caused by dental caries (tooth decay). This causes the pain you are having. °DIAGNOSIS  °Your caregiver can diagnose this problem by exam. °TREATMENT  °· If caused by an infection, it may be treated with medications which kill germs (antibiotics) and pain medications as prescribed by your caregiver. Take medications as directed. °· Only take over-the-counter or prescription medicines for pain, discomfort, or fever as directed by your caregiver. °· Whether the tooth ache today is caused by infection or dental disease, you should see your dentist as soon as possible for further care. °SEEK MEDICAL CARE IF: °The exam and treatment you received today has been provided on an emergency basis only. This is not a substitute for complete medical or dental care. If your problem worsens or new problems (symptoms) appear, and you are unable to meet with your dentist, call or return to this location. °SEEK IMMEDIATE MEDICAL CARE IF:  °· You have a fever. °· You develop redness and swelling of your face, jaw, or neck. °· You are unable to open your mouth. °· You have severe pain uncontrolled by pain medicine. °MAKE SURE YOU:  °· Understand these instructions. °· Will watch your condition. °· Will get help right away if you are not doing well or get worse. °Document Released: 09/30/2005 Document Revised: 12/23/2011 Document Reviewed: 05/18/2008 °ExitCare® Patient Information ©2015 [employer]. This information is not intended to replace advice given to you by your health care provider. Make sure you discuss any questions you have with your health care provider. ° °Emergency Department Resource Guide °1) Find a Doctor and Pay Out of Pocket °Although you won't have to find out who  is covered by your insurance plan, it is a good idea to ask around and get recommendations. You will then need to call the office and see if the doctor you have chosen will accept you as a new patient and what types of options they offer for patients who are self-pay. Some doctors offer discounts or will set up payment plans for their patients who do not have insurance, but you will need to ask so you aren't surprised when you get to your appointment. ° °2) Contact Your Local Health Department °Not all health departments have doctors that can see patients for sick visits, but many do, so it is worth a call to see if yours does. If you don't know where your local health department is, you can check in your phone book. The CDC also has a tool to help you locate your state's health department, and many state websites also have listings of all of their local health departments. ° °3) Find a Walk-in Clinic °If your illness is not likely to be very severe or complicated, you may want to try a walk in clinic. These are popping up all over the country in pharmacies, drugstores, and shopping centers. They're usually staffed by nurse practitioners or physician assistants that have been trained to treat common illnesses and complaints. They're usually fairly quick and inexpensive. However, if you have serious medical issues or chronic medical problems, these are probably not your best option. ° °No Primary Care Doctor: °- Call [facility] at  832-8000 - they can help you locate a primary   care doctor that  accepts your insurance, provides certain services, etc. °- Physician Referral Service- 1-800-533-3463 ° °Chronic Pain Problems: °Organization         Address  Phone   Notes  °[facility]  (336) 297-2271 Patients need to be referred by their primary care doctor.  ° °Medication Assistance: °Organization         Address  Phone   Notes  °[place] [facility] [address]  [address] °[place], [place] [address] (336) 641-8030 --Must be a resident of [place] °-- Must have NO insurance coverage whatsoever (no Medicaid/ Medicare, etc.) °-- The pt. MUST have a primary care doctor that directs their care regularly and follows them in the community °  °[facility]  (866) 331-1348   °[employer]  (888) 892-1162   ° °Agencies that provide inexpensive medical care: °Organization         Address  Phone   Notes  °[facility]  (336) 832-8035   °[facility]    (336) 832-7272   °[facility] [address] °[place], [place] [address] (336) 832-4777   °[facility] [address], °[place] (336) 271-4999   °[facility]    (336) 373-0678   °[facility]    (336) 272-1050   °[facility] ° [address], [place] Phone:  (336) 832-4444, Fax:  (336) 832-4440 Hours of Operation:  9 am - 6 pm, M-F.  Also accepts Medicaid/Medicare and self-pay.  °[facility] ° [address], [place] Phone: (336) 832-3150, Fax: (336) 832-3151. Hours of Operation:  8:30 am - 5:30 pm, M-F.  Also accepts Medicaid and self-pay.  °[facility] [place] [address], [place] Phone: (336) 878-6027   °[facility] [address], [place], [place] (336)723-1848, Ext. 123 Mondays & Thursdays: 7-9 AM.  First 15 patients are seen on a first come, first serve basis. °  ° °Medicaid-accepting [place] Providers: ° °Organization         Address  Phone   Notes  °[facility] [address], [place] (336) 641-2100 Also accepts self-pay patients.  °[facility] [address], [place] ° (336) 856-9996   °[facility] [address], [place] (336) 288-8857   °[facility] [address] [place] [address], [place] (336) 299-7000   °Veita Bland [address], [place]  ° (336) 373-1557 Only accepts [place] Access Medicaid patients after they have their name applied to their card.  ° °Self-Pay (no insurance) in [place]: ° °Organization         Address  Phone   Notes  °Sickle Cell Patients, [facility] [address], [place] (336) 832-1970   °[facility] [address], [place] (336) 832-4400   ° [facility] ° [address], [place] (336) 992-4800   °[facility]/Dr. Osei-Bonsu ° [address] [place] [address], [place] or [address], [place] (336) 841-8500 Phone number for both [place] and [place] locations is the same.  °[facility] [address],  (336) 299-0000   °[facility]  [address] [place] [address],  or [address] (336) 852-7530 °(336) 878-2260   °  [facility] [address], [place] (336) 350-1642, phone; (336) 294-5005, fax Sees patients 1st and 3rd Saturday of every month.  Must not qualify for public or private insurance (i.e. Medicaid, Medicare, [place] Health Choice, Veterans' Benefits) • Household income should be no more than 200% of the poverty level •The clinic cannot treat you if you are pregnant or think you are pregnant • Sexually transmitted diseases are not treated at the clinic.  ° ° °Dental Care: °Organization         Address  Phone  Notes  °[facility] [facility] [address], [place] (336) 641-6152 Accepts children up to age 21 who are enrolled in Medicaid or [place] Health Choice; pregnant women with a Medicaid card; and children who have applied for Medicaid or [place] Health Choice, but were declined, whose parents can pay a reduced fee at time of service.  °[facility] [place]  [address], [place] (336) 641-7733 Accepts children up to age 21 who are  enrolled in Medicaid or [place] Health Choice; pregnant women with a Medicaid card; and children who have applied for Medicaid or [place] Health Choice, but were declined, whose parents can pay a reduced fee at time of service.  °[facility] ° [address], [place] (336) 641-4533 Patients are seen by appointment only. Walk-ins are not accepted. [facility] will see patients 18 years of age and older. °Monday - Tuesday (8am-5pm) °Most Wednesdays (8:30-5pm) °$30 per visit, cash only  °[facility] ° [address], [place] (336) 641-4533 Patients are seen by appointment only. Walk-ins are not accepted. [facility] will see patients 18 years of age and older. °One Wednesday Evening (Monthly: Volunteer Based).  $30 per visit, cash only  °[facility]  (919) 537-3737 for adults; Children under age 4, call Graduate Pediatric Dentistry at (919) [phone]. Children aged 4-14, please call (919) [phone] to request a pediatric application. ° Dental services are provided in all areas of dental care including fillings, crowns and bridges, complete and partial dentures, implants, gum treatment, root canals, and extractions. Preventive care is also provided. Treatment is provided to both adults and children. °Patients are selected via a lottery and there is often a waiting list. °  °[facility] [address], °[place] ° (336) 763-8833 [email or web address] °  °[facility] [address], [place], [place] (336)723-1848, Ext. 123 Second and Fourth Thursday of each month, opens at 6:30 AM; Clinic ends at 9 AM.  Patients are seen on a first-come first-served basis, and a limited number are seen during each clinic.  ° °[facility] ° [address], [place], [place] (336) 723-7904   Eligibility Requirements °You must have lived in [place], [place], or [place] counties for at least the last three months. °  You  cannot be eligible for state or federal sponsored healthcare insurance, including Veterans Administration, Medicaid, or Medicare. °  You generally cannot be eligible for healthcare insurance through your employer.  °  How to apply: °Eligibility screenings are held every Tuesday and Wednesday afternoon from 1:00 pm until 4:00 pm. You do not need an appointment for the interview!  °[facility] [address], [place], [place] 336-631-2330   °[facility] [facility]  336-342-8273   °[place]  336-703-3100   ° [facility]  336-570-6415   ° °Behavioral Health Resources in the Community: °Intensive Outpatient Programs °Organization         Address  Phone  Notes  °[facility] [address], [place], [place] 336-878-6098   °[facility] [address], [place], [place] 336-832-9800   °[facility] [address], [place], [place] ° 336-882-2125   °[facility] [address],  °[place], [place] 1-800-853-5163 or 336-641-4981   °Substance Abuse Resources °Organization         Address  Phone  Notes  °[facility]  336-882-2125   °[facility]  336-784-9470   °[facility]  336-285-9073   °[facility]  336-845-3988   °Residential & Outpatient Substance Abuse Program  1-800-659-3381   °Psychological Services °Organization         Address  Phone  Notes  °[facility]  336- 832-9600   °[facility]  336- 378-7881   °[facility] [address], [place] 1-800-853-5163 or 336-641-4981   ° °Mobile Crisis Teams °Organization         Address  Phone  Notes  °[facility], Mobile Crisis Care Unit  1-877-626-1772   °Assertive °[facility] ° [address] [place], [place] 336-834-9664   °Sharon DeEsch [address] °[place] 336-554-5454   ° °Self-Help/Support  Groups °Organization         Address  Phone             Notes  °[facility] - variety of support groups  336- 373-1402 Call for more information  °Narcotics Anonymous (NA), [facility] [address], °[place]  2 meetings at this location  ° °Residential Treatment Programs °Organization         Address  Phone  Notes  °[facility] [address],    °[place]  1-866-801-8205   °[facility] ° [address], [place], [place] 704-293-8524   °[facility] [address], [place] 336-845-3988 Admissions: 8am-3pm M-F  °[facility] [address]    °[place], [place] 336-841-1104   °[facility] [address], [place], [place] 336-379-7146   °[facility] [address]  °[place], [place] 336-285-9073   °[facility] - Intensive Outpatient [address], [place], [place] 336-852-3033   °[facility] [address]  °[place], [place] 1-877-615-2722 or 336-784-9470   °[facility] [address] [place], [place] 336-227-7417 Accepts Medicaid  °[facility] [address]  °[place] 1-800-659-3381 Substance Abuse/Addiction Treatment  ° °[place] Behavioral Health Resources °Organization         Address  Phone  Notes  °[facility]  (888) 581-9988   °Julie Brannon, PhD [address] [place], [place]   (336) 349-5553 or (336) 951-0000   °[facility]   [address] °[place], [place] (336) 349-4454   °[facility] [address], [place], [place] (336) 342-8316 Insurance/Medicaid/sponsorship through [facility]  °[facility] [address]                                    [place], [place] (336) 342-8316 Therapy/tele-psych/case  °[facility]   [address]  ° [place], [place] (336) 349-2233    °Dr. Arfeen  (336) 349-4544   °[facility] [place]  [facility] [place] [facility] 1) [address], [place] °2) [address], [place] °3)  [address], [place] (336) 349-3220 °(336) 342-7768 ° °(336) 342-8140   °[place] [facility] (336) 342-1394 or (336) 342-3537 (After Hours)    ° ° ° °

## 2014-11-16 NOTE — ED Provider Notes (Signed)
2105 - Attempted to see patient with scribe for complaint of dental pain. Patient not in room. No belongings in room. Suspect patient LWBS. This Probation officer did not participate in this patient's medical care.  Filed Vitals:   11/16/14 1945  BP: 131/77  Pulse: 95  Temp: 98 F (36.7 C)  Resp: [address] PA-C 11/16/14 2108  Houston Siren III, MD 11/17/14 [phone]

## 2014-11-16 NOTE — ED Provider Notes (Signed)
CSN: [id number]     Arrival date & time 11/16/14  1941 History  This chart was scribed for Antonietta Breach, PA-C, working with Houston Siren III, * by Steva Colder, ED Scribe. The patient was seen in room WTR6/WTR6 at 9:35 PM.    Chief Complaint  Patient presents with  . Dental Pain    The history is provided by the patient. No language interpreter was used.    HPI Comments: Sherry [name] is a 49 y.o. female who presents to the Emergency Department complaining of worsening throbbing left upper dental problem onset yesterday. She reports that she has had this pain for a couple weeks. She reports that it feels like there is pus in her mouth because every once in awhile she will have a bad taste in her mouth. She states that she is having associated symptoms of HA, and pus draining. She states that she has tried Hydrocodone with mild relief for her symptoms. She reports that she gets the hydrocodone from her pain management doctor. She denies fever, ear discharge, trouble swallowing and any other symptoms. She reports that she used to have a dentist but no longer. She reports that she is moving to [place] soon and would like to see a dentist quickly.   Past Medical History  Diagnosis Date  . Vitamin D deficiency   . Gallstones   . Breast cancer 2010    s/p mastectomy  . Sinus infection     pt is on Doxycycline  . History of blood clots     neck-after reconstructive surgery  . Chronic back pain     bulding/herniated disc  . Hemorrhoids   . Constipation     related to pain meds and takes stool softener prn  . Nocturia   . Diabetes mellitus     takes Saxagliptin-Metformin daily  . Lyme disease     taking Doxycycline-to finish up 06/03/12  . Frozen shoulder   . DVT (deep venous thrombosis) 08/13/2013    R subclavian vein RUE.  . DVT of right axillary vein, acute 11/09/2013   Past Surgical History  Procedure Laterality Date  . Cesarean section  1992/1995  . Mastectomy  04/27/10     right  . Breast surgery    . Breast reconstruction  7/12  . Port a cath placed  2011  . Port a cath removed    . Lumbar laminectomy/decompression microdiscectomy  06/04/2012    Procedure: LUMBAR LAMINECTOMY/DECOMPRESSION MICRODISCECTOMY 1 LEVEL;  Surgeon: Melina Schools, MD;  Location: [facility];  Service: Orthopedics;  Laterality: N/A;  L4-5 BILATERAL HEMILAMINECTOMY/MICRODISCECTOMY  . Back surgery    . Tubal ligation     Family History  Problem Relation Age of Onset  . Diabetes Mother   . Diabetes Sister    History  Substance Use Topics  . Smoking status: Current Every Day Smoker -- 1.00 packs/day for 28 years    Types: Cigarettes  . Smokeless tobacco: Never Used  . Alcohol Use: No   OB History    No data available      Review of Systems  HENT: Positive for dental problem. Negative for ear discharge and trouble swallowing.   Neurological: Positive for headaches.  All other systems reviewed and are negative.   Allergies  Tramadol  Home Medications   Prior to Admission medications   Medication Sig Start Date End Date Taking? Authorizing Provider  amoxicillin (AMOXIL) 500 MG capsule Take 1 capsule (500 mg total) by mouth 3 (  three) times daily. 11/16/14   Antonietta Breach, PA-C  benzocaine (ORAJEL) 10 % mucosal gel Use as directed 1 application in the mouth or throat as needed for mouth pain. 11/16/14   Antonietta Breach, PA-C  cephALEXin (KEFLEX) 500 MG capsule Take 1 capsule (500 mg total) by mouth 2 (two) times daily. 07/26/14   Everlene Balls, MD  enoxaparin (LOVENOX) 120 MG/0.8ML injection INJECT 0.8MLS INTO THE SKIN EVERY DAY 08/02/14   Robyn Haber, MD  gabapentin (NEURONTIN) 100 MG capsule Take 100 mg by mouth 2 (two) times daily.    Historical Provider, MD  metFORMIN (GLUCOPHAGE) 500 MG tablet Take 500 mg by mouth 2 (two) times daily with a meal.    Historical Provider, MD  metroNIDAZOLE (FLAGYL) 500 MG tablet Take 1 tablet (500 mg total) by mouth 2 (two) times daily. One po bid x 7  days 07/26/14   Everlene Balls, MD  naproxen (NAPROSYN) 500 MG tablet Take 1 tablet (500 mg total) by mouth 2 (two) times daily. 11/16/14   Antonietta Breach, PA-C  sitaGLIPtin (JANUVIA) 100 MG tablet Take 1 tablet (100 mg total) by mouth daily. 08/13/13   Orma Flaming, MD  Tapentadol HCl (NUCYNTA) 100 MG TABS Take 1 tablet by mouth 2 (two) times daily.    Historical Provider, MD  Tapentadol HCl (NUCYNTA) 100 MG TABS Take 1 tablet (100 mg total) by mouth 2 (two) times daily as needed (severe pain). 07/26/14   Everlene Balls, MD   BP 131/77 mmHg  Pulse 95  Temp(Src) 98 F (36.7 C) (Oral)  Resp 18  Ht 5\' 1"  (1.549 m)  Wt 145 lb (65.772 kg)  BMI 27.41 kg/m2  SpO2 99%  LMP 10/28/2014  Physical Exam  Constitutional: She is oriented to person, place, and time. She appears well-developed and well-nourished. No distress.  Nontoxic/nonseptic appearing  HENT:  Head: Normocephalic and atraumatic.  Mouth/Throat: Uvula is midline, oropharynx is clear and moist and mucous membranes are normal. No trismus in the jaw. Abnormal dentition. Dental caries present. No dental abscesses. No oropharyngeal exudate.    No gingival swelling or fluctuance. TTP to L upper 2nd molar. No trismus. Patient tolerating secretions without difficulty.  Eyes: Conjunctivae and EOM are normal. No scleral icterus.  Neck: Normal range of motion.  Pulmonary/Chest: Effort normal. No respiratory distress.  Respirations even and unlabored  Musculoskeletal: Normal range of motion.  Neurological: She is alert and oriented to person, place, and time. She exhibits normal muscle tone. Coordination normal.  Skin: Skin is warm and dry. No rash noted. She is not diaphoretic. No erythema. No pallor.  Psychiatric: She has a normal mood and affect. Her behavior is normal.  Nursing note and vitals reviewed.   ED Course  Dental Date/Time: 11/16/2014 10:09 PM Performed by: Antonietta Breach Authorized by: Antonietta Breach Consent: The procedure was  performed in an emergent situation. Verbal consent obtained. Written consent not obtained. Risks and benefits: risks, benefits and alternatives were discussed Consent given by: patient Patient understanding: patient states understanding of the procedure being performed Patient consent: the patient's understanding of the procedure matches consent given Procedure consent: procedure consent matches procedure scheduled Relevant documents: relevant documents present and verified Test results: test results available and properly labeled Site marked: the operative site was marked Imaging studies: imaging studies available Required items: required blood products, implants, devices, and special equipment available Patient identity confirmed: arm band and verbally with patient Time out: Immediately prior to procedure a "time out" was called to  verify the correct patient, procedure, equipment, support staff and site/side marked as required. Local anesthesia used: yes Anesthesia: local infiltration (To base of L upper 2nd molar) Local anesthetic: bupivacaine 0.5% with epinephrine Anesthetic total: 1.8 ml Patient sedated: no Patient tolerance: Patient tolerated the procedure well with no immediate complications   (including critical care time) DIAGNOSTIC STUDIES: Oxygen Saturation is 99% on room air, normal by my interpretation.    COORDINATION OF CARE: 9:40 PM-Discussed treatment plan which includes dental block, amoxicillin Rx, and referral to dentist with pt at bedside and pt agreed to plan.   Labs Review Labs Reviewed - No data to display  Imaging Review No results found.   EKG Interpretation None      MDM   Final diagnoses:  Dentalgia    Patient with toothache. No gross abscess. Exam unconcerning for Ludwig's angina or spread of infection. Will treat with penicillin and Naproxen; patient followed by pain management. Urged patient to follow-up with dentist. Referral and resource  guide provided. Patient agreeable to plan with no unaddressed concerns.  I personally performed the services described in this documentation, which was scribed in my presence. The recorded information has been reviewed and is accurate.    Filed Vitals:   11/16/14 1945  BP: 131/77  Pulse: 95  Temp: 98 F (36.7 C)  TempSrc: Oral  Resp: 18  Height: 5\' 1"  (1.549 m)  Weight: 145 lb (65.772 kg)  SpO2: 99%     Antonietta Breach, PA-C 11/16/14 [place] III, MD 11/17/14 1740

## 2014-11-23 ENCOUNTER — Encounter (HOSPITAL_COMMUNITY): Payer: Self-pay | Admitting: Emergency Medicine

## 2014-11-23 ENCOUNTER — Emergency Department (HOSPITAL_COMMUNITY)
Admission: EM | Admit: 2014-11-23 | Discharge: 2014-11-23 | Disposition: A | Discharge status: Home / Self Care | Payer: Managed Care, Other (non HMO) | Attending: Emergency Medicine | Admitting: Emergency Medicine

## 2014-11-23 DIAGNOSIS — Z792 Long term (current) use of antibiotics: Secondary | ICD-10-CM | POA: Insufficient documentation

## 2014-11-23 DIAGNOSIS — Z8719 Personal history of other diseases of the digestive system: Secondary | ICD-10-CM | POA: Diagnosis not present

## 2014-11-23 DIAGNOSIS — Z79899 Other long term (current) drug therapy: Secondary | ICD-10-CM | POA: Insufficient documentation

## 2014-11-23 DIAGNOSIS — J0110 Acute frontal sinusitis, unspecified: Secondary | ICD-10-CM | POA: Insufficient documentation

## 2014-11-23 DIAGNOSIS — Z853 Personal history of malignant neoplasm of breast: Secondary | ICD-10-CM | POA: Insufficient documentation

## 2014-11-23 DIAGNOSIS — G8929 Other chronic pain: Secondary | ICD-10-CM | POA: Insufficient documentation

## 2014-11-23 DIAGNOSIS — Z86718 Personal history of other venous thrombosis and embolism: Secondary | ICD-10-CM | POA: Diagnosis not present

## 2014-11-23 DIAGNOSIS — E119 Type 2 diabetes mellitus without complications: Secondary | ICD-10-CM | POA: Insufficient documentation

## 2014-11-23 DIAGNOSIS — Z720 Tobacco use: Secondary | ICD-10-CM | POA: Insufficient documentation

## 2014-11-23 DIAGNOSIS — Z791 Long term (current) use of non-steroidal anti-inflammatories (NSAID): Secondary | ICD-10-CM | POA: Diagnosis not present

## 2014-11-23 DIAGNOSIS — Z8619 Personal history of other infectious and parasitic diseases: Secondary | ICD-10-CM | POA: Insufficient documentation

## 2014-11-23 DIAGNOSIS — R0981 Nasal congestion: Secondary | ICD-10-CM | POA: Diagnosis present

## 2014-11-23 NOTE — ED Notes (Signed)
Patient states she started having nasal congestion with yellow return a few days ago.  Patient states she is already on amoxicillin for toothache.   Patient denies other symptoms, but states "I feel awful".

## 2014-11-23 NOTE — Discharge Instructions (Signed)
Continue to take her amoxicillin as directed.  Use nasal saline (you can try [employer] Simply Saline) at least 4 times a day, use saline 5-10 minutes before using the fluticasone (flonase) nasal spray  Do not use Afrin (Oxymetazoline)  Rest, wash hands frequently  and drink plenty of water.  You may try counter medication such as Mucinex or Sudafed decongestant.   Sinusitis Sinusitis is redness, soreness, and puffiness (inflammation) of the air pockets in the bones of your face (sinuses). The redness, soreness, and puffiness can cause air and mucus to get trapped in your sinuses. This can allow germs to grow and cause an infection.  HOME CARE   Drink enough fluids to keep your pee (urine) clear or pale yellow.  Use a humidifier in your home.  Run a hot shower to create steam in the bathroom. Sit in the bathroom with the door closed. Breathe in the steam 3-4 times a day.  Put a warm, moist washcloth on your face 3-4 times a day, or as told by your doctor.  Use salt water sprays (saline sprays) to wet the thick fluid in your nose. This can help the sinuses drain.  Only take medicine as told by your doctor. GET HELP RIGHT AWAY IF:   Your pain gets worse.  You have very bad headaches.  You are sick to your stomach (nauseous).  You throw up (vomit).  You are very sleepy (drowsy) all the time.  Your face is puffy (swollen).  Your vision changes.  You have a stiff neck.  You have trouble breathing. MAKE SURE YOU:   Understand these instructions.  Will watch your condition.  Will get help right away if you are not doing well or get worse. Document Released: 03/18/2008 Document Revised: 06/24/2012 Document Reviewed: 05/05/2012 [facility] Patient Information 2015 [place], [place]. This information is not intended to replace advice given to you by your health care provider. Make sure you discuss any questions you have with your health care provider.

## 2014-11-23 NOTE — ED Provider Notes (Signed)
CSN: [id number]     Arrival date & time 11/23/14  0814 History   First MD Initiated Contact with Patient 11/23/14 [id number]     Chief Complaint  Patient presents with  . Nasal Congestion     (Consider location/radiation/quality/duration/timing/severity/associated sxs/prior Treatment) HPI   Sherry [name] is a 49 y.o. female with past medical history significant for DVT, Lyme disease, non-insulin-dependent diabetes complaining of nasal congestion and drainage with yellow discharge starting 2 days ago. Reports associated symptoms of fatigue. Patient denies otalgia, fever, chills, sore throat, cervicalgia, cough, chest pain, shortness of breath, nausea, vomiting, change in bowel or bladder habits. She is being treated for a dental abscess with amoxicillin: She is on 3 out of a 10 day course.  Past Medical History  Diagnosis Date  . Vitamin D deficiency   . Gallstones   . Breast cancer 2010    s/p mastectomy  . Sinus infection     pt is on Doxycycline  . History of blood clots     neck-after reconstructive surgery  . Chronic back pain     bulding/herniated disc  . Hemorrhoids   . Constipation     related to pain meds and takes stool softener prn  . Nocturia   . Diabetes mellitus     takes Saxagliptin-Metformin daily  . Lyme disease     taking Doxycycline-to finish up 06/03/12  . Frozen shoulder   . DVT (deep venous thrombosis) 08/13/2013    R subclavian vein RUE.  . DVT of right axillary vein, acute 11/09/2013   Past Surgical History  Procedure Laterality Date  . Cesarean section  1992/1995  . Mastectomy  04/27/10    right  . Breast surgery    . Breast reconstruction  7/12  . Port a cath placed  2011  . Port a cath removed    . Lumbar laminectomy/decompression microdiscectomy  06/04/2012    Procedure: LUMBAR LAMINECTOMY/DECOMPRESSION MICRODISCECTOMY 1 LEVEL;  Surgeon: Melina Schools, MD;  Location: [facility];  Service: Orthopedics;  Laterality: N/A;  L4-5 BILATERAL  HEMILAMINECTOMY/MICRODISCECTOMY  . Back surgery    . Tubal ligation     Family History  Problem Relation Age of Onset  . Diabetes Mother   . Diabetes Sister    History  Substance Use Topics  . Smoking status: Current Every Day Smoker -- 1.00 packs/day for 28 years    Types: Cigarettes  . Smokeless tobacco: Never Used  . Alcohol Use: No   OB History    No data available     Review of Systems  10 systems reviewed and found to be negative, except as noted in the HPI.   Allergies  Tramadol  Home Medications   Prior to Admission medications   Medication Sig Start Date End Date Taking? Authorizing Provider  amoxicillin (AMOXIL) 500 MG capsule Take 1 capsule (500 mg total) by mouth 3 (three) times daily. 11/16/14   Antonietta Breach, PA-C  benzocaine (ORAJEL) 10 % mucosal gel Use as directed 1 application in the mouth or throat as needed for mouth pain. 11/16/14   Antonietta Breach, PA-C  cephALEXin (KEFLEX) 500 MG capsule Take 1 capsule (500 mg total) by mouth 2 (two) times daily. 07/26/14   Everlene Balls, MD  enoxaparin (LOVENOX) 120 MG/0.8ML injection INJECT 0.8MLS INTO THE SKIN EVERY DAY 08/02/14   Robyn Haber, MD  gabapentin (NEURONTIN) 100 MG capsule Take 100 mg by mouth 2 (two) times daily.    Historical Provider, MD  metFORMIN (GLUCOPHAGE)  500 MG tablet Take 500 mg by mouth 2 (two) times daily with a meal.    Historical Provider, MD  metroNIDAZOLE (FLAGYL) 500 MG tablet Take 1 tablet (500 mg total) by mouth 2 (two) times daily. One po bid x 7 days 07/26/14   Everlene Balls, MD  naproxen (NAPROSYN) 500 MG tablet Take 1 tablet (500 mg total) by mouth 2 (two) times daily. 11/16/14   Antonietta Breach, PA-C  sitaGLIPtin (JANUVIA) 100 MG tablet Take 1 tablet (100 mg total) by mouth daily. 08/13/13   Orma Flaming, MD  Tapentadol HCl (NUCYNTA) 100 MG TABS Take 1 tablet by mouth 2 (two) times daily.    Historical Provider, MD  Tapentadol HCl (NUCYNTA) 100 MG TABS Take 1 tablet (100 mg total) by mouth 2  (two) times daily as needed (severe pain). 07/26/14   Everlene Balls, MD   BP 124/75 mmHg  Pulse 87  Temp(Src) 98 F (36.7 C) (Oral)  Resp 18  SpO2 97%  LMP 10/28/2014 Physical Exam  Constitutional: She is oriented to person, place, and time. She appears well-developed and well-nourished. No distress.  HENT:  Head: Normocephalic and atraumatic.  Right Ear: External ear normal.  Left Ear: External ear normal.  Nose: Nose normal.  Mouth/Throat: Oropharynx is clear and moist.  No drooling or stridor. Posterior pharynx mildly erythematous no significant tonsillar hypertrophy. No exudate. Soft palate rises symmetrically. No TTP or induration under tongue.   ++ tenderness to palpation of frontal and bilateral maxillary sinuses.  ++ mucosal edema in the nares.  Bilateral tympanic membranes with normal architecture and good light reflex.    Eyes: Conjunctivae and EOM are normal. Pupils are equal, round, and reactive to light.  Cardiovascular: Normal rate, regular rhythm and intact distal pulses.   Pulmonary/Chest: Effort normal and breath sounds normal. No stridor. No respiratory distress. She has no wheezes. She has no rales. She exhibits no tenderness.  Abdominal: Soft. Bowel sounds are normal. She exhibits no distension and no mass. There is no tenderness. There is no rebound and no guarding.  Musculoskeletal: Normal range of motion.  Neurological: She is alert and oriented to person, place, and time.  Psychiatric: She has a normal mood and affect.  Nursing note and vitals reviewed.   ED Course  Procedures (including critical care time) Labs Review Labs Reviewed - No data to display  Imaging Review No results found.   EKG Interpretation None      MDM   Final diagnoses:  Acute frontal sinusitis, recurrence not specified     Filed Vitals:   11/23/14 0830  BP: 124/75  Pulse: 87  Temp: 98 F (36.7 C)  TempSrc: Oral  Resp: 18  SpO2: 97%    Sherry [name] is a  pleasant 49 y.o. female presenting with sinus congestion and discharge consistent with an acute sinusitis. Patient is being treated for a dental abscess with amoxicillin. I've advised her to continue to take the amoxicillin as it will also treat her sinusitis. I've also encouraged her to obtain over-the-counter medication such as Flonase and decongestants.  Evaluation does not show pathology that would require ongoing emergent intervention or inpatient treatment. Pt is hemodynamically stable and mentating appropriately. Discussed findings and plan with patient/guardian, who agrees with care plan. All questions answered. Return precautions discussed and outpatient follow up given.        Monico Blitz, PA-C 11/23/14 0900  Francine Graven, DO 11/23/14 1943

## 2015-12-12 ENCOUNTER — Inpatient Hospital Stay
Admit: 2015-12-12 | Discharge: 2015-12-12 | Disposition: A | Discharge status: Home / Self Care | Payer: Self-pay | Attending: Emergency Medicine

## 2015-12-12 ENCOUNTER — Emergency Department: Admit: 2015-12-12 | Payer: Self-pay | Primary: Nurse Practitioner

## 2015-12-12 DIAGNOSIS — M5442 Lumbago with sciatica, left side: Secondary | ICD-10-CM

## 2015-12-12 LAB — URINALYSIS W/ RFLX MICROSCOPIC
Bilirubin: NEGATIVE
Blood: NEGATIVE
Glucose: >1000 mg/dL — AB
Ketone: 40 mg/dL — AB
Leukocyte Esterase: NEGATIVE
Nitrites: NEGATIVE
Specific gravity: >1.03 — ABNORMAL HIGH (ref 1.005–1.030)
Urobilinogen: 0.2 EU/dL (ref 0.2–1.0)
pH (UA): 5.5 (ref 5.0–8.0)

## 2015-12-12 LAB — HCG URINE, QL: HCG urine, QL: NEGATIVE

## 2015-12-12 LAB — URINE MICROSCOPIC ONLY
Bacteria: NEGATIVE /hpf
RBC: NEGATIVE /hpf (ref 0–5)
WBC: NEGATIVE /hpf (ref 0–4)

## 2015-12-12 LAB — GLUCOSE, POC: Glucose (POC): 349 mg/dL — ABNORMAL HIGH (ref 70–110)

## 2015-12-12 MED ORDER — METHYLPREDNISOLONE 4 MG TABS IN A DOSE PACK
4 mg | ORAL | 0 refills | Status: DC
Start: 2015-12-12 — End: 2016-05-14

## 2015-12-12 MED ORDER — KETOROLAC TROMETHAMINE 60 MG/2 ML IM
60 mg/2 mL | INTRAMUSCULAR | Status: AC
Start: 2015-12-12 — End: 2015-12-12
  Administered 2015-12-12: 15:00:00 via INTRAMUSCULAR

## 2015-12-12 MED ORDER — METHYLPREDNISOLONE (PF) 125 MG/2 ML IJ SOLR
125 mg/2 mL | INTRAMUSCULAR | Status: AC
Start: 2015-12-12 — End: 2015-12-12
  Administered 2015-12-12: 15:00:00 via INTRAMUSCULAR

## 2015-12-12 MED ORDER — FLUCONAZOLE 150 MG TAB
150 mg | ORAL | Status: AC
Start: 2015-12-12 — End: 2015-12-12
  Administered 2015-12-12: 16:00:00 via ORAL

## 2015-12-12 MED ORDER — OXYCODONE-ACETAMINOPHEN 5 MG-325 MG TAB
5-325 mg | ORAL_TABLET | ORAL | 0 refills | Status: DC | PRN
Start: 2015-12-12 — End: 2016-05-14

## 2015-12-12 MED ORDER — HYDROMORPHONE (PF) 1 MG/ML IJ SOLN
1 mg/mL | Freq: Once | INTRAMUSCULAR | Status: AC
Start: 2015-12-12 — End: 2015-12-12
  Administered 2015-12-12: 15:00:00 via INTRAMUSCULAR

## 2015-12-12 MED ORDER — IBUPROFEN 800 MG TAB
800 mg | ORAL_TABLET | Freq: Three times a day (TID) | ORAL | 0 refills | Status: AC
Start: 2015-12-12 — End: 2015-12-17

## 2015-12-12 MED FILL — HYDROMORPHONE (PF) 1 MG/ML IJ SOLN: 1 mg/mL | INTRAMUSCULAR | Qty: 1

## 2015-12-12 MED FILL — FLUCONAZOLE 150 MG TAB: 150 mg | ORAL | Qty: 1

## 2015-12-12 MED FILL — KETOROLAC TROMETHAMINE 60 MG/2 ML IM: 60 mg/2 mL | INTRAMUSCULAR | Qty: 2

## 2015-12-12 MED FILL — SOLU-MEDROL (PF) 125 MG/2 ML SOLUTION FOR INJECTION: 125 mg/2 mL | INTRAMUSCULAR | Qty: 2

## 2015-12-12 NOTE — ED Triage Notes (Addendum)
1. Woke up yesterday with low back pain that radiates down left leg  2. Pt stats she has had this before  3. Pt state she fell 2 weeks ago going up the stairs  4. Pt states getting worse  5. Pt states off all medication X 6 months  6. Blood sugar in traige 349mg /dl

## 2015-12-12 NOTE — ED Notes (Signed)
I have reviewed discharge instructions with the patient.  The patient verbalized understanding.Patient armband removed and shredded.

## 2015-12-12 NOTE — ED Provider Notes (Signed)
Patient is a 50 y.o. female presenting with back pain and leg pain. The history is provided by the patient.   Back Pain    This is a new problem. The problem occurs constantly. The pain is present in the lumbar spine. The pain radiates to the left thigh. The pain is severe. The symptoms are aggravated by bending and twisting. The pain is the same all the time. Associated symptoms include leg pain. Pertinent negatives include no chest pain, no fever, no numbness, no weight loss, no headaches, no abdominal pain, no bowel incontinence, no bladder incontinence, no dysuria and no weakness.   Leg Pain    Associated symptoms include back pain. Pertinent negatives include no numbness and no neck pain.      Pt is c/o lower back pain that began yesterday; pain radiates to left hip and left leg.  H/o prior back surgery @3yrs  ago in [place] and feels like the pain is in the same area.  Denies fever, dysuria.  Did fall 'up the stairs' last week but was fine afterwards.  Denies saddle anesthesia, bowel incontinence, rash.    Smokes 1 ppd x 14yrs.  Denies ETOH, illicits.  LNMP 10/29/15.    Past Medical History:   Diagnosis Date   ??? Back pain    ??? Breast CA ([place])    ??? Diabetes ([place])        History reviewed. No pertinent surgical history.      History reviewed. No pertinent family history.    Social History     Social History   ??? Marital status: N/A     Spouse name: N/A   ??? Number of children: N/A   ??? Years of education: N/A     Occupational History   ??? Not on file.     Social History Main Topics   ??? Smoking status: Current Every Day Smoker   ??? Smokeless tobacco: Not on file   ??? Alcohol use No   ??? Drug use: Not on file   ??? Sexual activity: Not on file     Other Topics Concern   ??? Not on file     Social History Narrative   ??? No narrative on file         ALLERGIES: Tramadol    Review of Systems   Constitutional: Positive for activity change. Negative for appetite change, fever, unexpected weight change and weight loss.    Respiratory: Negative for cough and wheezing.    Cardiovascular: Negative for chest pain.   Gastrointestinal: Negative for abdominal pain, bowel incontinence, diarrhea, nausea and vomiting.   Genitourinary: Negative for bladder incontinence, dysuria, flank pain and hematuria.   Musculoskeletal: Positive for back pain. Negative for neck pain.   Skin: Negative for rash and wound.   Neurological: Negative for weakness, numbness and headaches.   Psychiatric/Behavioral: Positive for agitation. Negative for confusion.       Vitals:    12/12/15 0853   BP: (!) 156/106   Pulse: (!) 115   Resp: 20   Temp: 99 ??F (37.2 ??C)   SpO2: 96%   Weight: 63.5 kg (140 lb)            Physical Exam   Constitutional: She is oriented to person, place, and time. She appears well-developed. She appears distressed.   Tearing; leaning to right to avoid pressure on her left side.   HENT:   Head: Normocephalic and atraumatic.   Eyes: Pupils are equal, round, and reactive to  light.   Neck: No JVD present. No tracheal deviation present. No thyromegaly present.   Cardiovascular: Normal rate, regular rhythm and normal heart sounds.  Exam reveals no gallop and no friction rub.    No murmur heard.  Pulmonary/Chest: Effort normal and breath sounds normal. No stridor. No respiratory distress. She has no wheezes. She has no rales. She exhibits no tenderness.   Abdominal: Soft. She exhibits no distension and no mass. There is no tenderness. There is no rebound and no guarding.   Musculoskeletal: She exhibits no edema or tenderness.   Lymphadenopathy:     She has no cervical adenopathy.   Neurological: She is alert and oriented to person, place, and time. She exhibits normal muscle tone.   Skin: Skin is warm and dry. No rash noted. No erythema. No pallor.   Psychiatric: She has a normal mood and affect. Her behavior is normal. Thought content normal.   Nursing note and vitals reviewed.       MDM  Number of Diagnoses or Management Options   Acute left-sided low back pain with left-sided sciatica:   Elevated blood pressure:   Tobacco abuse:   Yeast cystitis:   Diagnosis management comments: Spondylosis; DJD; arthritis; herniated disk; cauda equina syndrome; fracture; contusion; sprain; sciatica      Noting acute on films.  Feels like she's getting a UTI; treat with Diflucan.  Give PCP and ortho referrals.  Asking for life coach assistance.       Amount and/or Complexity of Data Reviewed  Tests in the radiology section of CPT??: ordered and reviewed      ED Course       Procedures    Recent Results (from the past 12 hour(s))   URINALYSIS W/ RFLX MICROSCOPIC    Collection Time: 12/12/15  9:00 AM   Result Value Ref Range    Color YELLOW      Appearance CLEAR      Specific gravity >1.030 (H) 1.005 - 1.030    pH (UA) 5.5 5.0 - 8.0      Protein TRACE (A) NEG mg/dL    Glucose >1000 (A) NEG mg/dL    Ketone 40 (A) NEG mg/dL    Bilirubin NEGATIVE  NEG      Blood NEGATIVE  NEG      Urobilinogen 0.2 0.2 - 1.0 EU/dL    Nitrites NEGATIVE  NEG      Leukocyte Esterase NEGATIVE  NEG     HCG URINE, QL    Collection Time: 12/12/15  9:00 AM   Result Value Ref Range    HCG urine, Ql. NEGATIVE  NEG     URINE MICROSCOPIC ONLY    Collection Time: 12/12/15  9:00 AM   Result Value Ref Range    WBC NEGATIVE  0 - 4 /hpf    RBC NEGATIVE  0 - 5 /hpf    Epithelial cells FEW 0 - 5 /lpf    Bacteria NEGATIVE  NEG /hpf    Yeast FEW (A) NEG     GLUCOSE, POC    Collection Time: 12/12/15  9:03 AM   Result Value Ref Range    Glucose (POC) 349 (H) 70 - 110 mg/dL     10:38 AM  Diagnosis:   1. Acute left-sided low back pain with left-sided sciatica    2. Tobacco abuse    3. Elevated blood pressure    4. Yeast cystitis          Disposition: home  Follow-up Information     Follow up With Details Comments Contact Info    Elnora Morrison, MD Call in 2 days  [address]  [place] [address]  [phone]      [place] Call today Ask for assistance in making your follow  up appointments [facility]    Mali R Manke, MD Schedule an appointment as soon as possible for a visit in 1 week  [place] [address]  [phone]            Patient's Medications   Start Taking    IBUPROFEN (MOTRIN) 800 MG TABLET    Take 1 Tab by mouth every eight (8) hours for 5 days.    METHYLPREDNISOLONE (MEDROL, PAK,) 4 MG TABLET    Per dose pack instructions    OXYCODONE-ACETAMINOPHEN (PERCOCET) 5-325 MG PER TABLET    Take 1 Tab by mouth every four (4) hours as needed for Pain. Max Daily Amount: 6 Tabs.   Continue Taking    No medications on file   These Medications have changed    No medications on file   Stop Taking    No medications on file

## 2016-01-29 IMAGING — CR DG KNEE 1-2V*R*
2 series · 2 of 2 positions shown · non-contrast
Comparison: DG HIP COMPLETE*R* dated 02/25/2014; KNEE - 4 VIEWS
dated 07/20/2011

CLINICAL DATA: Low back pain.  Right knee after a fall tonight.

EXAM:
RIGHT KNEE - 1-2 VIEW

[t knee ap right]
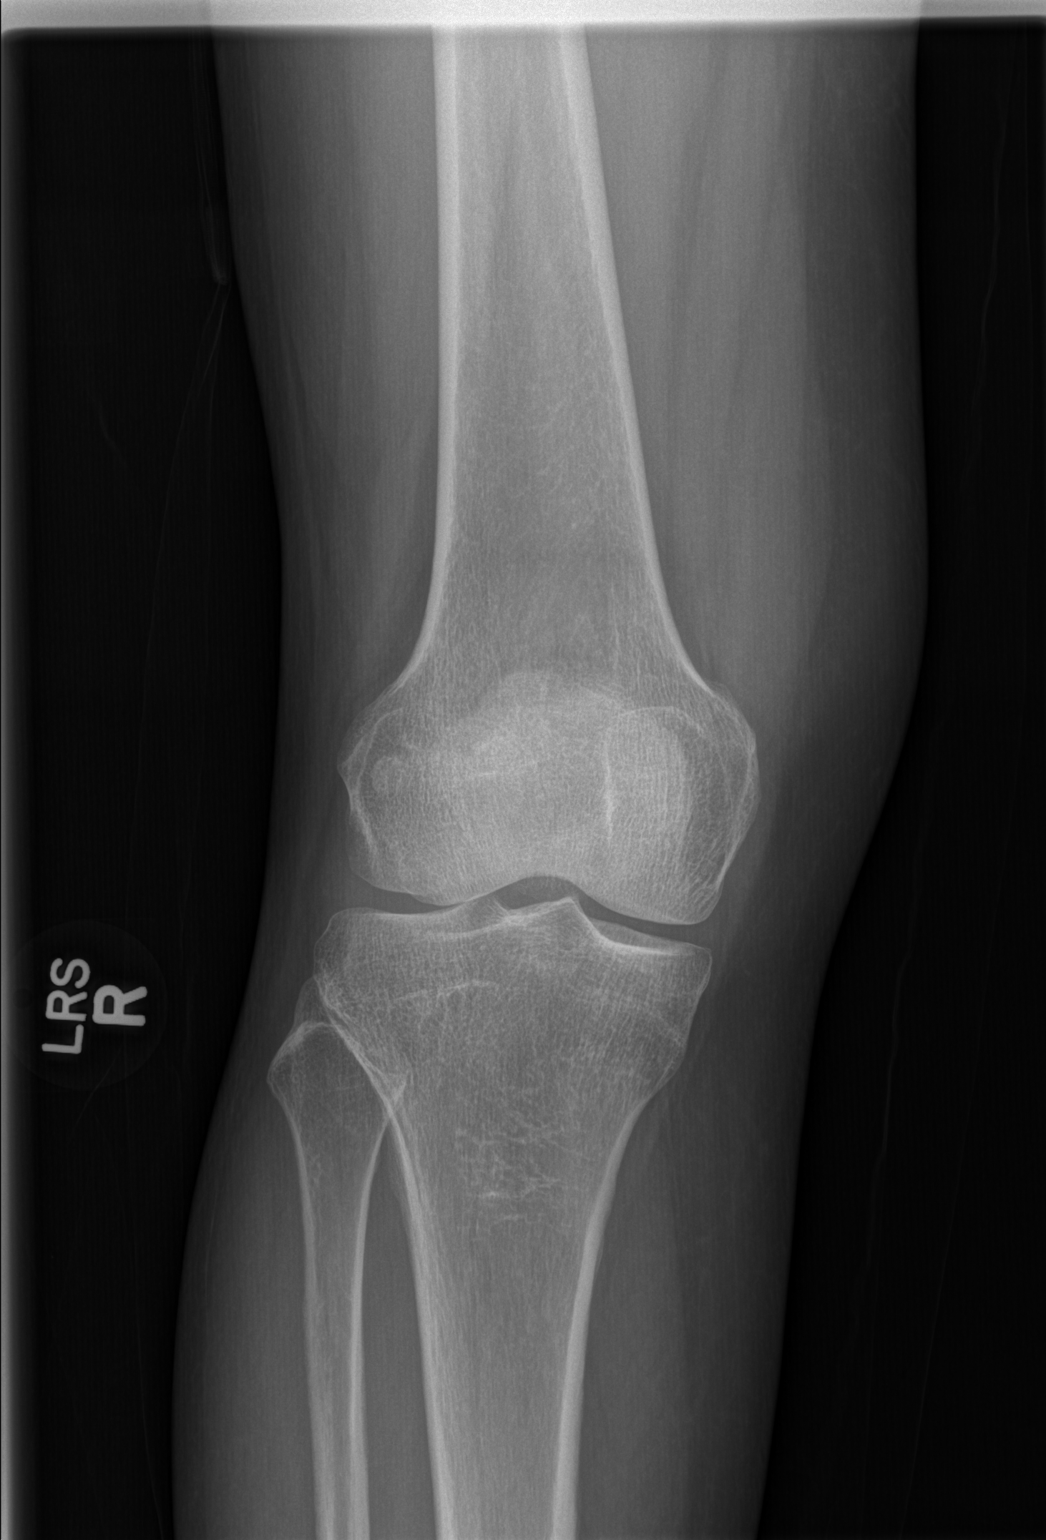

[t knee lat right]
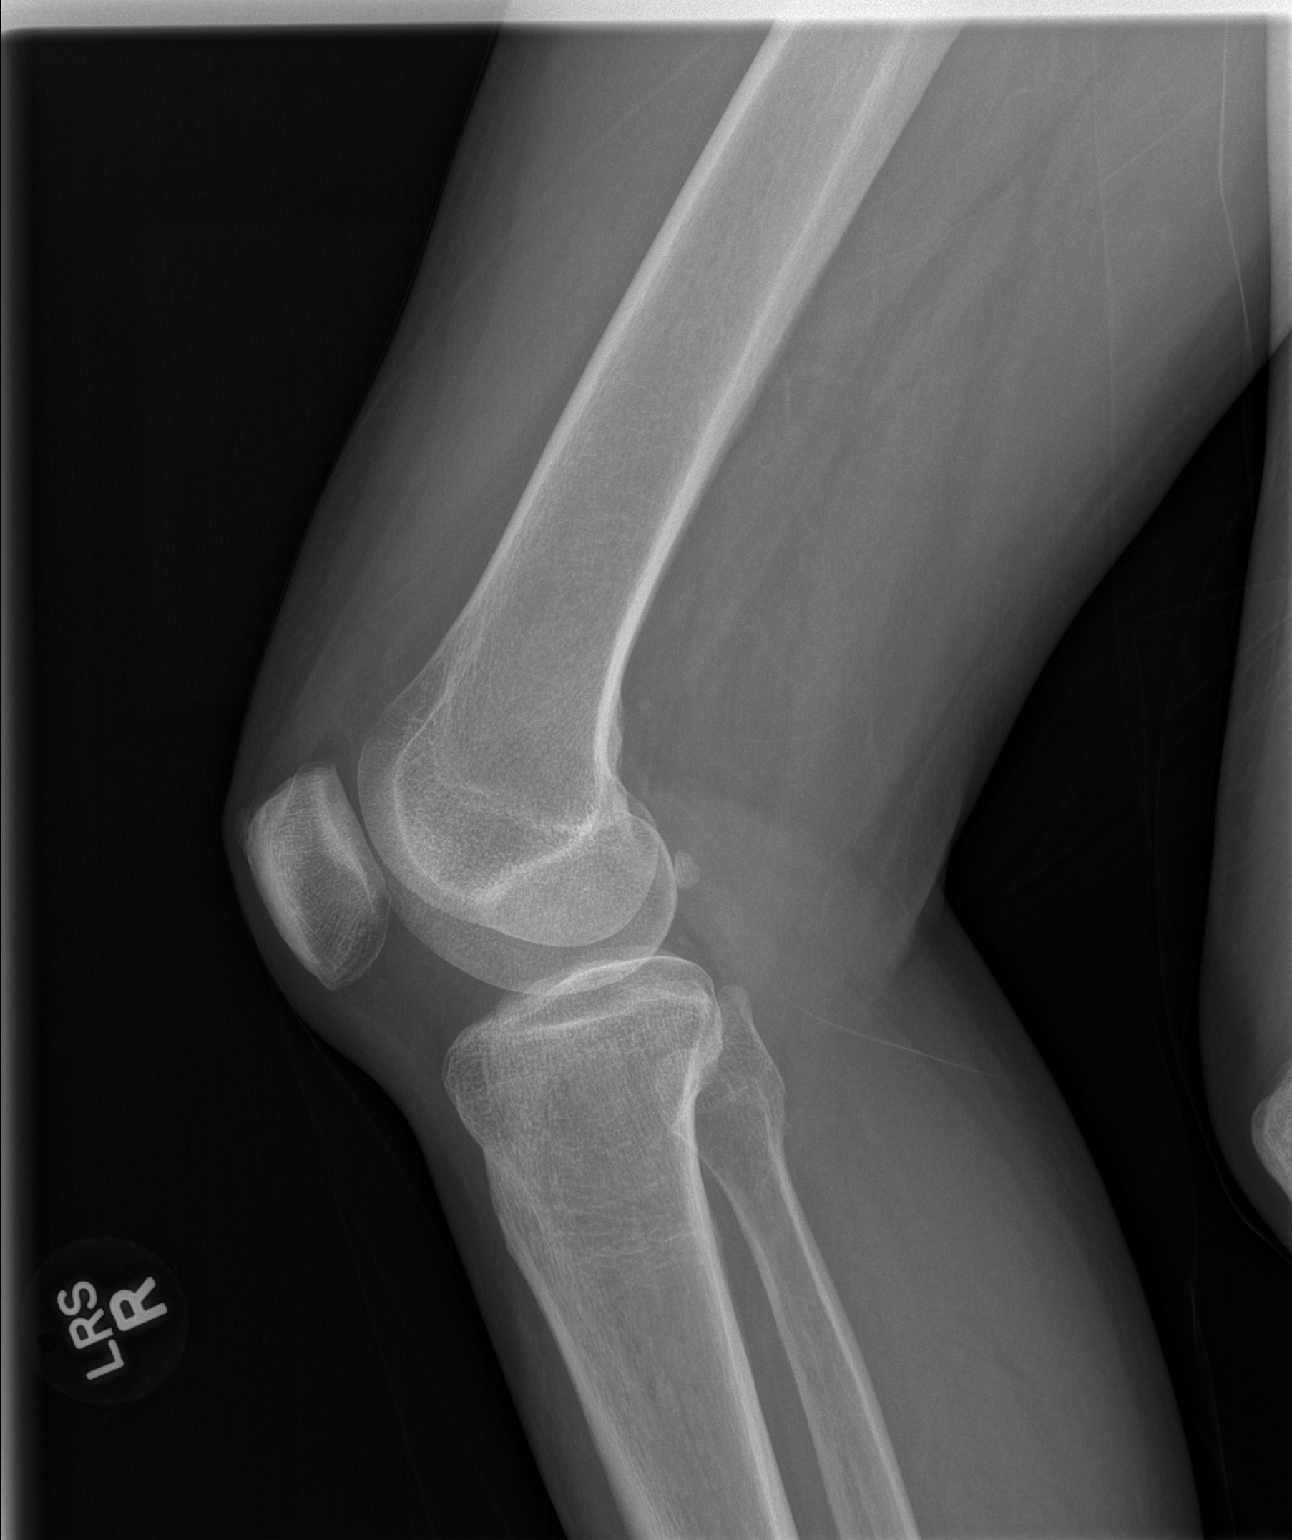

[2 of 2 positions shown; findings below may reference images not displayed]

FINDINGS: There is no evidence of fracture, dislocation, or joint effusion.
There is no evidence of arthropathy or other focal bone abnormality.
Soft tissues are unremarkable.
IMPRESSION: Negative.

## 2016-01-29 IMAGING — CR DG HIP COMPLETE 2+V*R*
3 series · 3 of 3 positions shown · non-contrast
Comparison: KNEE - 4 VIEWS dated 07/20/2011; HIP - 2 VIEWS dated
02/05/2011

CLINICAL DATA: Low back anterior right knee after a fall tonight.

EXAM:
RIGHT HIP - COMPLETE 2+ VIEW

[t pelvis ap]
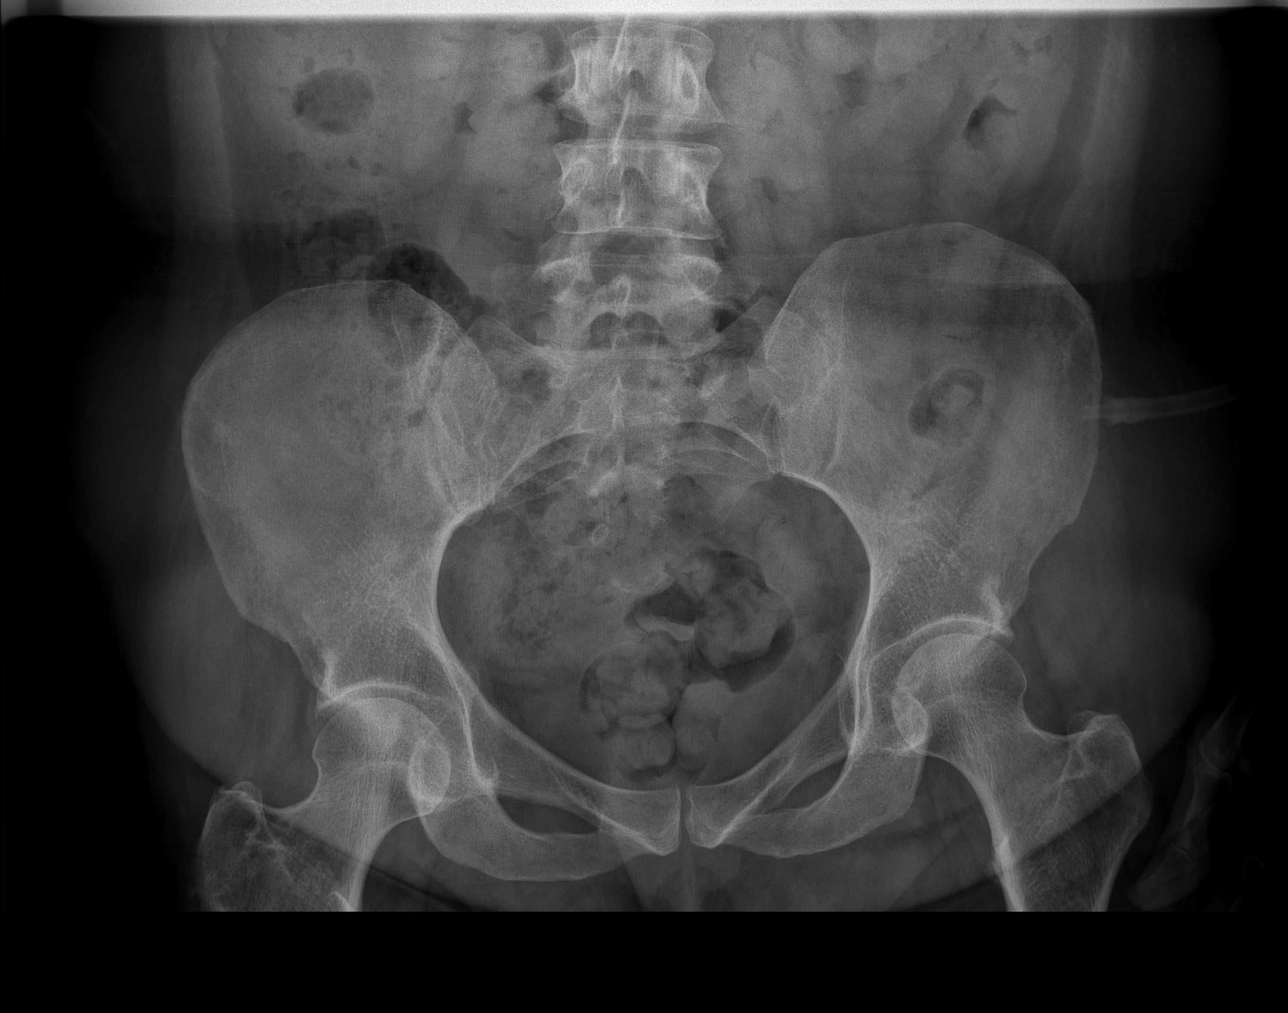

[t hip ap right]
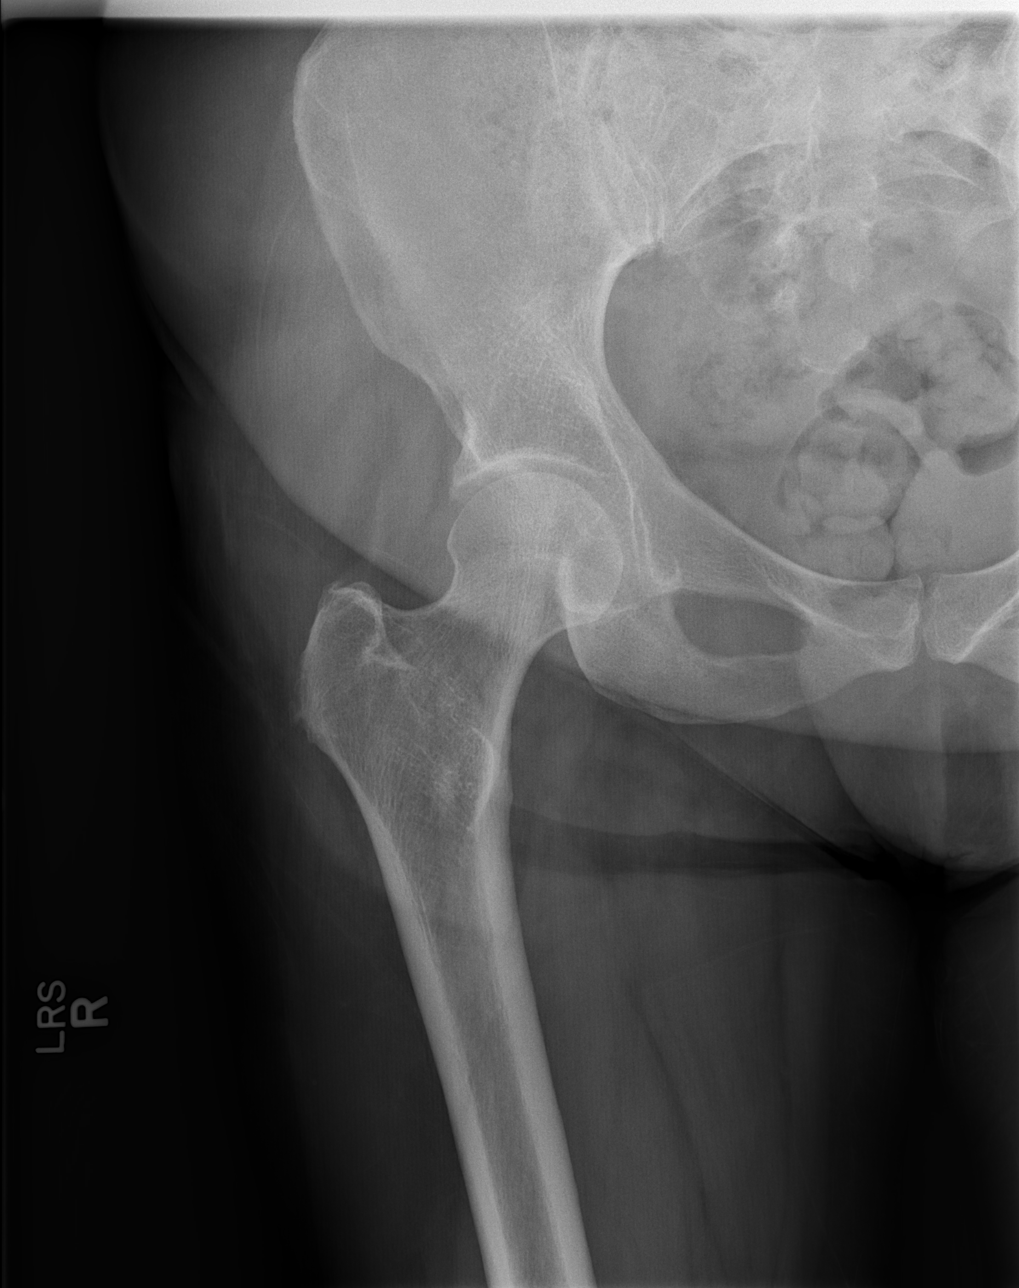

[t hip frog leg right]
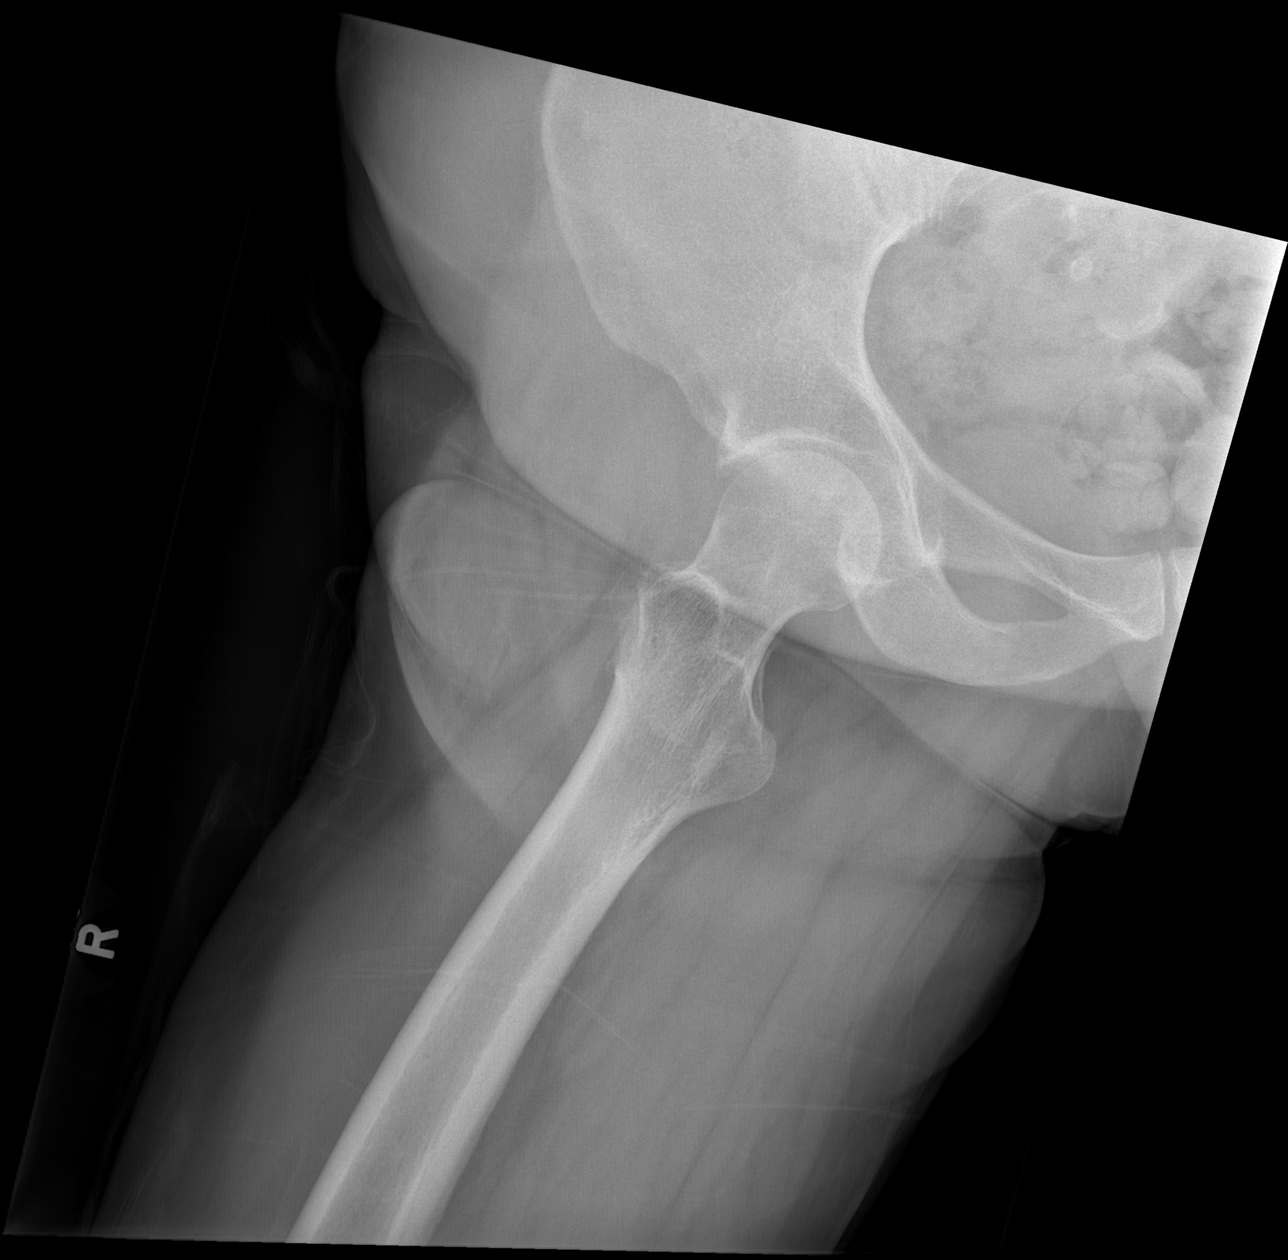

[3 of 3 positions shown; findings below may reference images not displayed]

FINDINGS: There is no evidence of hip fracture or dislocation. There is no
evidence of arthropathy or other focal bone abnormality.
IMPRESSION: Negative.

## 2016-05-14 ENCOUNTER — Emergency Department: Admit: 2016-05-15 | Payer: Self-pay | Primary: Nurse Practitioner

## 2016-05-14 DIAGNOSIS — G8929 Other chronic pain: Secondary | ICD-10-CM

## 2016-05-14 NOTE — ED Notes (Signed)
I have reviewed discharge instructions with the patient.  The patient verbalized understanding.

## 2016-05-14 NOTE — ED Triage Notes (Signed)
Patient came into the ED complaining of chronic lower back pain, that got worse than usual about 2 days ago.
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
## 2016-05-15 ENCOUNTER — Inpatient Hospital Stay
Admit: 2016-05-15 | Discharge: 2016-05-15 | Disposition: A | Discharge status: Home / Self Care | Payer: Self-pay | Attending: Emergency Medicine

## 2016-05-15 MED ORDER — METHOCARBAMOL 750 MG TAB
750 mg | ORAL_TABLET | Freq: Three times a day (TID) | ORAL | 0 refills | Status: AC | PRN
Start: 2016-05-15 — End: 2016-05-21

## 2016-05-15 MED ORDER — METHOCARBAMOL 500 MG TAB
500 mg | ORAL | Status: AC
Start: 2016-05-15 — End: 2016-05-14
  Administered 2016-05-15: 02:00:00 via ORAL

## 2016-05-15 MED FILL — METHOCARBAMOL 500 MG TAB: 500 mg | ORAL | Qty: 2

## 2016-05-28 ENCOUNTER — Inpatient Hospital Stay
Admit: 2016-05-28 | Discharge: 2016-05-28 | Disposition: A | Discharge status: Home / Self Care | Payer: Self-pay | Attending: Emergency Medicine

## 2016-05-28 DIAGNOSIS — M545 Low back pain: Secondary | ICD-10-CM

## 2016-05-28 MED ORDER — HYDROCODONE-ACETAMINOPHEN 7.5 MG-325 MG TAB
ORAL | Status: AC
Start: 2016-05-28 — End: 2016-05-28
  Administered 2016-05-28: 06:00:00 via ORAL

## 2016-05-28 MED ORDER — IBUPROFEN 800 MG TAB
800 mg | ORAL_TABLET | Freq: Three times a day (TID) | ORAL | 1 refills | Status: AC | PRN
Start: 2016-05-28 — End: 2016-06-04

## 2016-05-28 MED ORDER — CYCLOBENZAPRINE 10 MG TAB
10 mg | ORAL_TABLET | Freq: Two times a day (BID) | ORAL | 0 refills | Status: DC | PRN
Start: 2016-05-28 — End: 2017-09-15

## 2016-05-28 MED ORDER — DIAZEPAM 5 MG TAB
5 mg | ORAL | Status: AC
Start: 2016-05-28 — End: 2016-05-28
  Administered 2016-05-28: 06:00:00 via ORAL

## 2016-05-28 MED ORDER — ACETAMINOPHEN 500 MG TAB
500 mg | ORAL_TABLET | Freq: Four times a day (QID) | ORAL | 0 refills | Status: DC | PRN
Start: 2016-05-28 — End: 2017-05-11

## 2016-05-28 MED ORDER — IBUPROFEN 600 MG TAB
600 mg | ORAL | Status: AC
Start: 2016-05-28 — End: 2016-05-28
  Administered 2016-05-28: 06:00:00 via ORAL

## 2016-05-28 MED FILL — DIAZEPAM 5 MG TAB: 5 mg | ORAL | Qty: 1

## 2016-05-28 MED FILL — IBUPROFEN 600 MG TAB: 600 mg | ORAL | Qty: 1

## 2016-05-28 MED FILL — HYDROCODONE-ACETAMINOPHEN 7.5 MG-325 MG TAB: ORAL | Qty: 1

## 2016-05-28 NOTE — ED Notes (Signed)
I have reviewed discharge instructions with the patient.  The patient verbalized understanding.  Patient armband removed and shredded.  Patient discharged via wheelchair to private vehicle.

## 2016-05-28 NOTE — ED Triage Notes (Addendum)
Pt c/o bilateral knee pain and lower back pain that started "to flare up" today at 1300.  Pt states she has had these issues in the past.  No injury to knees or back recently

## 2016-05-28 NOTE — ED Notes (Signed)
Pt states pain is exacerbated with movement.

## 2016-05-28 NOTE — ED Provider Notes (Signed)
HPI Comments: Barbara [name] is a 50 y.o. Female with c/o worsening lower back pain, knee pains today, exacerbated with movement. No numbness, tingling, weakness, saddle anesthesia, b/b dysfunction, nvd, hematuria, fcs, h/o cancer, recent trauma. Long h/o pain similar, worse than normal today. Has been unable to f/u due to lack of insurance    The history is provided by the patient.        Past Medical History:   Diagnosis Date   ??? Back pain    ??? Breast CA ([place])    ??? Diabetes ([place])        History reviewed. No pertinent surgical history.      History reviewed. No pertinent family history.    Social History     Social History   ??? Marital status: MARRIED     Spouse name: N/A   ??? Number of children: N/A   ??? Years of education: N/A     Occupational History   ??? Not on file.     Social History Main Topics   ??? Smoking status: Current Every Day Smoker   ??? Smokeless tobacco: Not on file   ??? Alcohol use No   ??? Drug use: Not on file   ??? Sexual activity: Not on file     Other Topics Concern   ??? Not on file     Social History Narrative         ALLERGIES: Tramadol    Review of Systems   Constitutional: Negative for fever.   HENT: Negative for trouble swallowing.    Respiratory: Negative for shortness of breath.    Cardiovascular: Negative for chest pain.   Gastrointestinal: Negative for abdominal pain.   Genitourinary: Negative for difficulty urinating, dysuria and frequency.   Musculoskeletal: Positive for back pain and myalgias.   Skin: Negative for rash.   Allergic/Immunologic: Negative for immunocompromised state.   Neurological: Negative for numbness.   Psychiatric/Behavioral: Positive for sleep disturbance.       Vitals:    05/28/16 0153   BP: (!) 147/107   Pulse: 99   Resp: 18   Temp: 98.2 ??F (36.8 ??C)   SpO2: 100%   Weight: 61.2 kg (135 lb)            Physical Exam   Constitutional: She is oriented to person, place, and time. She appears well-developed and well-nourished. She appears distressed (appears  uncomfortable, not toxic).   HENT:   Head: Normocephalic and atraumatic.   Right Ear: External ear normal.   Left Ear: External ear normal.   Nose: Nose normal.   Mouth/Throat: Uvula is midline, oropharynx is clear and moist and mucous membranes are normal.   Eyes: Conjunctivae are normal. No scleral icterus.   Neck: Neck supple.   Cardiovascular: Normal rate, regular rhythm, normal heart sounds and intact distal pulses.    Pulmonary/Chest: Effort normal and breath sounds normal.   Abdominal: Soft. There is no tenderness.   Musculoskeletal: She exhibits no edema.   There is diffuse lower back ttp, spasm. No deformity, skin changes, sig spinal ttp   Neurological: She is alert and oriented to person, place, and time. Gait normal.   Nl gait. No foot drop. No notable weakness, loss of lt sensation le. Both knees with nl no swelling, skin changes   Skin: Skin is warm and dry. She is not diaphoretic.   Psychiatric: Her behavior is normal.   Nursing note and vitals reviewed.       MDM  ED Course       Procedures  Vitals:  Patient Vitals for the past 12 hrs:   Temp Pulse Resp BP SpO2   05/28/16 0153 98.2 ??F (36.8 ??C) 99 18 (!) 147/107 100 %         Medications ordered:   Medications   ibuprofen (MOTRIN) tablet 600 mg (600 mg Oral Given 05/28/16 0213)   HYDROcodone-acetaminophen (NORCO) 7.5-325 mg per tablet 1 Tab (1 Tab Oral Given 05/28/16 0213)   diazePAM (VALIUM) tablet 5 mg (5 mg Oral Given 05/28/16 0213)         Lab findings:  No results found for this or any previous visit (from the past 12 hour(s)).    EKG interpretation by ED Physician:      X-Ray, CT or other radiology findings or impressions:  No orders to display       Progress notes, Consult notes or additional Procedure notes:   Doubt need for imaging, labs, other work up here. Chronic issues  I have discussed with patient and/or family/sig other the results, interpretation of any imaging if performed, suspected diagnosis and  treatment plan to include instructions regarding the diagnoses listed to which understanding was expressed with all questions answered      Reevaluation of patient:   Stable for dc    Disposition:  Diagnosis:   1. Acute exacerbation of chronic low back pain    2. Chronic pain of both knees        Disposition: home      Follow-up Information     Follow up With Details Comments [facility] ([place]) Schedule an appointment as soon as possible for a visit  [place] [id number]  [phone]            Patient's Medications   Start Taking    ACETAMINOPHEN (TYLENOL EXTRA STRENGTH) 500 MG TABLET    Take 2 Tabs by mouth every six (6) hours as needed for Pain.    CYCLOBENZAPRINE (FLEXERIL) 10 MG TABLET    Take 1 Tab by mouth two (2) times daily as needed for Muscle Spasm(s).    IBUPROFEN (MOTRIN) 800 MG TABLET    Take 1 Tab by mouth every eight (8) hours as needed for Pain (with food) for up to 7 days.   Continue Taking    No medications on file   These Medications have changed    No medications on file   Stop Taking    No medications on file

## 2016-12-21 ENCOUNTER — Inpatient Hospital Stay
Admit: 2016-12-21 | Discharge: 2016-12-21 | Disposition: A | Discharge status: Home Health | Payer: PRIVATE HEALTH INSURANCE | Attending: Emergency Medicine

## 2016-12-21 DIAGNOSIS — K0889 Other specified disorders of teeth and supporting structures: Secondary | ICD-10-CM

## 2016-12-21 MED ORDER — PENICILLIN V-K 500 MG TAB
500 mg | ORAL_TABLET | Freq: Four times a day (QID) | ORAL | 0 refills | Status: AC
Start: 2016-12-21 — End: 2016-12-31

## 2016-12-21 MED ORDER — IBUPROFEN 600 MG TAB
600 mg | ORAL_TABLET | Freq: Three times a day (TID) | ORAL | 0 refills | Status: DC
Start: 2016-12-21 — End: 2017-01-23

## 2016-12-21 MED ORDER — FLUCONAZOLE 150 MG TAB
150 mg | ORAL_TABLET | ORAL | 0 refills | Status: AC
Start: 2016-12-21 — End: 2016-12-21

## 2016-12-21 MED ORDER — OXYCODONE-ACETAMINOPHEN 5 MG-325 MG TAB
5-325 mg | ORAL_TABLET | ORAL | 0 refills | Status: DC | PRN
Start: 2016-12-21 — End: 2017-01-23

## 2016-12-21 NOTE — ED Triage Notes (Signed)
Pt c/o continued dental pain since being seen at [facility] on 12/19/2016.

## 2016-12-21 NOTE — ED Provider Notes (Signed)
EMERGENCY DEPARTMENT HISTORY AND PHYSICAL EXAM    10:08 AM      Date: 12/21/2016  Patient Name: Barbara [name]    History of Presenting Illness     Chief Complaint   Patient presents with   ??? Dental Pain         History Provided By: Patient    Chief Complaint: Right upper dental pain  Duration:  Days  Timing:  Constant  Location: Right upper mouth  Quality:   Severity: Moderate  Modifying Factors: None  Associated Symptoms: Right ear pain      Additional History (Context): Barbara [name] is a 51 y.o. female with a hx of diabetes and breast cancer who presents with complaints of right upper dental pain that started a few days ago. She reports that she was seen at [facility] 2 days ago and was given a dental block which she states did not work. She was prescribed Clindamycin 300mg  at that time which she has been taking as prescribed. Since being seen an additional tooth started hurting. She notes associated right sided ear pain. She will be seen by her dentist in 6 days. Her LNMP was 12/04/15. She has taken both Percocet and Vicodin in the past with her breast cancer with no adverse effects. She denies fever, trauma, sore throat, and any further complaints.       PCP: None    Current Outpatient Prescriptions   Medication Sig Dispense Refill   ??? oxyCODONE-acetaminophen (PERCOCET) 5-325 mg per tablet Take 1 Tab by mouth every four (4) hours as needed for Pain. Max Daily Amount: 6 Tabs. 12 Tab 0   ??? ibuprofen (MOTRIN) 600 mg tablet Take 1 Tab by mouth every eight (8) hours. 15 Tab 0   ??? penicillin v potassium (VEETID) 500 mg tablet Take 1 Tab by mouth four (4) times daily for 10 days. 40 Tab 0   ??? acetaminophen (TYLENOL EXTRA STRENGTH) 500 mg tablet Take 2 Tabs by mouth every six (6) hours as needed for Pain. 50 Tab 0   ??? cyclobenzaprine (FLEXERIL) 10 mg tablet Take 1 Tab by mouth two (2) times daily as needed for Muscle Spasm(s). 14 Tab 0       Past History     Past Medical History:  Past Medical History:   Diagnosis Date    ??? Back pain    ??? Breast CA ([place])    ??? Diabetes [facility])        Past Surgical History:  History reviewed. No pertinent surgical history.    Family History:  History reviewed. No pertinent family history.    Social History:  Social History   Substance Use Topics   ??? Smoking status: Current Every Day Smoker   ??? Smokeless tobacco: Never Used   ??? Alcohol use No       Allergies:  Allergies   Allergen Reactions   ??? Tramadol Anaphylaxis         Review of Systems       Review of Systems   Constitutional: Negative for chills and fever.   HENT: Positive for dental problem and ear pain. Negative for congestion and sore throat.    Respiratory: Negative for cough and shortness of breath.    Cardiovascular: Negative for chest pain and leg swelling.   Gastrointestinal: Negative for abdominal pain and nausea.   Genitourinary: Negative for dysuria and hematuria.   Musculoskeletal: Negative for back pain.   Skin: Negative for rash and wound.   Neurological:  Negative for syncope, light-headedness and headaches.   Psychiatric/Behavioral: Negative for behavioral problems. The patient is not nervous/anxious.    All other systems reviewed and are negative.        Physical Exam     Visit Vitals   ??? BP (!) 169/117 (BP 1 Location: Right arm, BP Patient Position: At rest)   ??? Pulse 97   ??? Temp 98.1 ??F (36.7 ??C)   ??? Resp 18   ??? Ht 5\' 2"  (1.575 m)   ??? Wt 74.4 kg (164 lb)   ??? SpO2 100%   ??? BMI 30 kg/m2       Physical Exam   Constitutional: She is oriented to person, place, and time. She appears well-developed. She appears distressed.   HENT:   Head: Normocephalic and atraumatic.   Right Ear: External ear normal.   Left Ear: External ear normal.   Dental: right upper gingiva adjacent to tender second premolar is inflamed, swollen, tender.  No drooling or trismus.   Eyes: Pupils are equal, round, and reactive to light.   Neck: No JVD present. No tracheal deviation present. No thyromegaly present.    Cardiovascular: Normal rate, regular rhythm and normal heart sounds.  Exam reveals no gallop and no friction rub.    No murmur heard.  Pulmonary/Chest: Effort normal and breath sounds normal. No stridor. No respiratory distress. She has no wheezes. She has no rales. She exhibits no tenderness.   Abdominal: Soft. She exhibits no distension and no mass. There is no tenderness. There is no rebound and no guarding.   Musculoskeletal: She exhibits no edema or tenderness.   Lymphadenopathy:     She has no cervical adenopathy.   Neurological: She is alert and oriented to person, place, and time.   Skin: Skin is warm and dry. No rash noted. No erythema. No pallor.   Psychiatric: She has a normal mood and affect. Her behavior is normal. Thought content normal.   Nursing note and vitals reviewed.        Diagnostic Study Results     Labs -  No results found for this or any previous visit (from the past 12 hour(s)).    Radiologic Studies -   No orders to display         Medical Decision Making   I am the first provider for this patient.    I reviewed the vital signs, available nursing notes, past medical history, past surgical history, family history and social history.    Vital Signs-Reviewed the patient's vital signs.    Pulse Oximetry Analysis -  100% on room air (Interpretation)WNL    Cardiac Monitor:  Rate: 97 BPM  Rhythm:  Normal Sinus Rhythm       Records Reviewed: Nursing Notes and Old Medical Records (Time of Review: 10:08 AM)    ED Course: Progress Notes, Reevaluation, and Consults:  10:15 AM  Discussed findings, as well as, diagnosis and plan for discharge. Pt verbalizes understanding and agreement with plan. All questions addressed at this time.      Provider Notes (Medical Decision Making):     Differential: gingivitis; abscess; caries        Diagnosis     Clinical Impression:   1. Toothache    2. Elevated blood pressure reading        Disposition: Discharge    Follow-up Information      Follow up With Details Comments Contact Info    [place] Schedule  an appointment as soon as possible for a visit in 2 days  [place] Patmos  [phone]    [facility] EMERGENCY DEPT  If symptoms worsen return immediately [place]  [phone]           Patient's Medications   Start Taking    IBUPROFEN (MOTRIN) 600 MG TABLET    Take 1 Tab by mouth every eight (8) hours.    OXYCODONE-ACETAMINOPHEN (PERCOCET) 5-325 MG PER TABLET    Take 1 Tab by mouth every four (4) hours as needed for Pain. Max Daily Amount: 6 Tabs.    PENICILLIN V POTASSIUM (VEETID) 500 MG TABLET    Take 1 Tab by mouth four (4) times daily for 10 days.   Continue Taking    ACETAMINOPHEN (TYLENOL EXTRA STRENGTH) 500 MG TABLET    Take 2 Tabs by mouth every six (6) hours as needed for Pain.    CYCLOBENZAPRINE (FLEXERIL) 10 MG TABLET    Take 1 Tab by mouth two (2) times daily as needed for Muscle Spasm(s).   These Medications have changed    No medications on file   Stop Taking    No medications on file     _______________________________    Attestations:  [place], PA acting as a scribe for and in the presence of Hima Randolm Idol, MD      December 21, 2016 at 10:18 AM       Provider Attestation:      I personally performed the services described in the documentation, reviewed the documentation, as recorded by the scribe in my presence, and it accurately and completely records my words and actions. December 21, 2016 at 10:18 AM - Bearl Mulberry, MD    _______________________________

## 2016-12-21 NOTE — ED Notes (Signed)
Discharge medications reviewed with patient and appropriate educational materials and side effects teaching were provided.  I have reviewed discharge instructions with the patient.  The patient verbalized understanding.

## 2017-01-23 ENCOUNTER — Inpatient Hospital Stay
Admit: 2017-01-23 | Discharge: 2017-01-23 | Disposition: A | Discharge status: Home / Self Care | Payer: PRIVATE HEALTH INSURANCE | Attending: Emergency Medicine

## 2017-01-23 ENCOUNTER — Emergency Department: Admit: 2017-01-23 | Payer: PRIVATE HEALTH INSURANCE | Primary: Nurse Practitioner

## 2017-01-23 DIAGNOSIS — E1165 Type 2 diabetes mellitus with hyperglycemia: Secondary | ICD-10-CM

## 2017-01-23 LAB — METABOLIC PANEL, COMPREHENSIVE
A-G Ratio: 0.9 (ref 0.8–1.7)
ALT (SGPT): 40 U/L (ref 13–56)
AST (SGOT): 25 U/L (ref 15–37)
Albumin: 3.2 g/dL — ABNORMAL LOW (ref 3.4–5.0)
Alk. phosphatase: 172 U/L — ABNORMAL HIGH (ref 45–117)
Anion gap: 6 mmol/L (ref 3.0–18)
BUN/Creatinine ratio: 32 — ABNORMAL HIGH (ref 12–20)
BUN: 13 MG/DL (ref 7.0–18)
Bilirubin, total: 0.3 MG/DL (ref 0.2–1.0)
CO2: 24 mmol/L (ref 21–32)
Calcium: 8.1 MG/DL — ABNORMAL LOW (ref 8.5–10.1)
Chloride: 108 mmol/L (ref 100–108)
Creatinine: 0.41 MG/DL — ABNORMAL LOW (ref 0.6–1.3)
GFR est AA: >60 mL/min/{1.73_m2} (ref >60)
GFR est non-AA: >60 mL/min/{1.73_m2} (ref >60)
Globulin: 3.5 g/dL (ref 2.0–4.0)
Glucose: 239 mg/dL — ABNORMAL HIGH (ref 74–99)
Potassium: 3.5 mmol/L (ref 3.5–5.5)
Protein, total: 6.7 g/dL (ref 6.4–8.2)
Sodium: 138 mmol/L (ref 136–145)

## 2017-01-23 LAB — URINALYSIS W/ RFLX MICROSCOPIC
Bilirubin: NEGATIVE
Blood: NEGATIVE
Glucose: >1000 mg/dL — AB
Leukocyte Esterase: NEGATIVE
Nitrites: NEGATIVE
Protein: NEGATIVE mg/dL
Specific gravity: 1.023 (ref 1.005–1.030)
Urobilinogen: 0.2 EU/dL (ref 0.2–1.0)
pH (UA): 5 (ref 5.0–8.0)

## 2017-01-23 LAB — CBC WITH AUTOMATED DIFF
ABS. BASOPHILS: 0.1 10*3/uL — ABNORMAL HIGH (ref 0.0–0.06)
ABS. EOSINOPHILS: 0.2 10*3/uL (ref 0.0–0.4)
ABS. LYMPHOCYTES: 4.3 10*3/uL — ABNORMAL HIGH (ref 0.9–3.6)
ABS. MONOCYTES: 0.6 10*3/uL (ref 0.05–1.2)
ABS. NEUTROPHILS: 6.8 10*3/uL (ref 1.8–8.0)
BASOPHILS: 0 % (ref 0–2)
EOSINOPHILS: 1 % (ref 0–5)
HCT: 47.9 % — ABNORMAL HIGH (ref 35.0–45.0)
HGB: 17.1 g/dL — ABNORMAL HIGH (ref 12.0–16.0)
LYMPHOCYTES: 36 % (ref 21–52)
MCH: 32.1 PG (ref 24.0–34.0)
MCHC: 35.7 g/dL (ref 31.0–37.0)
MCV: 89.9 FL (ref 74.0–97.0)
MONOCYTES: 5 % (ref 3–10)
MPV: 10.6 FL (ref 9.2–11.8)
NEUTROPHILS: 58 % (ref 40–73)
PLATELET: 243 10*3/uL (ref 135–420)
RBC: 5.33 M/uL — ABNORMAL HIGH (ref 4.20–5.30)
RDW: 12.3 % (ref 11.6–14.5)
WBC: 11.9 10*3/uL (ref 4.6–13.2)

## 2017-01-23 LAB — PH, VENOUS: VENOUS PH: 7.37 (ref 7.32–7.42)

## 2017-01-23 LAB — GLUCOSE, POC
Glucose (POC): 270 mg/dL — ABNORMAL HIGH (ref 70–110)
Glucose (POC): 266 mg/dL — ABNORMAL HIGH (ref 70–110)

## 2017-01-23 LAB — HCG QL SERUM: HCG, Ql.: NEGATIVE

## 2017-01-23 MED ORDER — LIDOCAINE 5 % (700 MG/PATCH) ADHESIVE PATCH
5 % | CUTANEOUS | Status: DC
Start: 2017-01-23 — End: 2017-01-23

## 2017-01-23 MED ORDER — NAPROXEN 500 MG TAB
500 mg | ORAL_TABLET | Freq: Two times a day (BID) | ORAL | 0 refills | Status: AC
Start: 2017-01-23 — End: 2017-02-02

## 2017-01-23 MED ORDER — SODIUM CHLORIDE 0.9% BOLUS IV
0.9 % | Freq: Once | INTRAVENOUS | Status: AC
Start: 2017-01-23 — End: 2017-01-23
  Administered 2017-01-23: 16:00:00 via INTRAVENOUS

## 2017-01-23 MED ORDER — METFORMIN 1,000 MG TAB
1000 mg | ORAL_TABLET | Freq: Two times a day (BID) | ORAL | 0 refills | Status: AC
Start: 2017-01-23 — End: 2017-02-06

## 2017-01-23 MED ORDER — LIDOCAINE 5 % (700 MG/PATCH) ADHESIVE PATCH
5 % | CUTANEOUS | 0 refills | Status: DC
Start: 2017-01-23 — End: 2017-06-09

## 2017-01-23 MED FILL — LIDOCAINE 5 % (700 MG/PATCH) ADHESIVE PATCH: 5 % | CUTANEOUS | Qty: 1

## 2017-01-23 MED FILL — SODIUM CHLORIDE 0.9 % IV: INTRAVENOUS | Qty: 1000

## 2017-01-23 NOTE — ED Notes (Signed)
Removed IV from hand

## 2017-01-23 NOTE — ED Triage Notes (Signed)
Blood sugar 316 this morning. Right hip feels swollen. Lower back pain. No PCP. Just got insurance.

## 2017-01-23 NOTE — ED Triage Notes (Signed)
poc glucose 270

## 2017-01-23 NOTE — ED Provider Notes (Signed)
EMERGENCY DEPARTMENT HISTORY AND PHYSICAL EXAM    11:16 AM      Date: 01/23/2017  Patient Name: Barbara [name]    History of Presenting Illness     Chief Complaint   Patient presents with   ??? High Blood Sugar   ??? Back Pain         History Provided By: Patient    Chief Complaint: High Blood Sugar   Duration:  This morning   Timing:  Acute  Location: Patient also c/o R hip pain and Low lumbar back pain  Quality: N/A  Severity: 8 out of 10  Modifying Factors: Patient also c/o chronic lumbar back pain for which she alleviates with heat.   Associated Symptoms: Patient also c/o chronic R hip pain and edema, and chronic lumbar back pain. Denies other associated symptoms, such as dysuria, difficulty urinating, CP, abdominal pain, N/V/D.       Additional History (Context): Barbara [name] is a 51 y.o. female with PMHx of Back Pain, Breast CA, and NIDDM who presents with High Blood Sugar onset this morning. Patient states that she checked her blood sugar this morning (which she does every morning), and it resulted at 316. Patient states that she takes Metformin 572m BID, for which she reports compliance. Also notes that while taking her Metformin, she is unable to lower her blood sugar past 250. Patient also c/o chronic lumbar back pain for which she alleviates with heat. Notes hx of Back Surgery secondary to a herniated bulging disc. Patient also c/o R Hip pain that has been present for 6 months, but noted her R hip edema that has been present for 2-3 months.Denies other associated symptoms, such as dysuria, difficulty urinating, CP, abdominal pain, N/V/D. Reports Tobacco use of 1ppd. Denies EtOH use and illicit drug use. Reports recently having insurance, but denies having a PCP. There are no other concerns or complaints expressed at this time.     PCP: PElnora Morrison MD    Current Facility-Administered Medications   Medication Dose Route Frequency Provider Last Rate Last Dose    ??? lidocaine (LIDODERM) 5 % patch 1 Patch  1 Patch TransDERmal Q24H HBearl Mulberry MD   1 Patch at 01/23/17 1254     Current Outpatient Prescriptions   Medication Sig Dispense Refill   ??? metFORMIN (GLUCOPHAGE) 1,000 mg tablet Take 1 Tab by mouth two (2) times daily (with meals) for 14 days. 28 Tab 0   ??? lidocaine (LIDODERM) 5 % Apply patch to the affected area for 12 hours a day and remove for 12 hours a day. 10 Each 0   ??? naproxen (NAPROSYN) 500 mg tablet Take 1 Tab by mouth two (2) times daily (with meals) for 10 days. 20 Tab 0   ??? cyclobenzaprine (FLEXERIL) 10 mg tablet Take 1 Tab by mouth two (2) times daily as needed for Muscle Spasm(s). 14 Tab 0   ??? acetaminophen (TYLENOL EXTRA STRENGTH) 500 mg tablet Take 2 Tabs by mouth every six (6) hours as needed for Pain. 50 Tab 0       Past History     Past Medical History:  Past Medical History:   Diagnosis Date   ??? Back pain    ??? [place]    ??? Diabetes ([facility]        Past Surgical History:  History reviewed. No pertinent surgical history.    Family History:  History reviewed. No pertinent family history.    Social History:  Social History   Substance Use Topics   ??? Smoking status: Current Every Day Smoker   ??? Smokeless tobacco: Never Used   ??? Alcohol use No       Allergies:  Allergies   Allergen Reactions   ??? Tramadol Anaphylaxis         Review of Systems     Review of Systems   Constitutional: Negative for chills.        (+) Night sweats      HENT: Negative for congestion and sore throat.    Respiratory: Negative for cough and shortness of breath.    Cardiovascular: Negative for chest pain.   Gastrointestinal: Negative for abdominal pain, diarrhea, nausea and vomiting.   Genitourinary: Negative for difficulty urinating and dysuria.        Notes "sweet" Vaginal odor   Musculoskeletal: Positive for arthralgias (R hip pain (chronic)) and back pain (lumbar (chronic)).   Skin: Negative for rash.   Neurological: Negative for dizziness and headaches.    All other systems reviewed and are negative.        Physical Exam     Visit Vitals   ??? BP 142/83   ??? Pulse 82   ??? Temp 97.9 ??F (36.6 ??C)   ??? Resp 16   ??? Ht 5' 2"  (1.575 m)   ??? Wt 63.5 kg (140 lb)   ??? LMP 12/05/2016   ??? SpO2 97%   ??? BMI 25.61 kg/m2     Physical Exam  General Exam: Patient is a well developed and well nourished in no distress.  Patient does not appear acutely ill or toxic.  Eye Exam: Lids and conjunctiva are normal  ENT Exam: The general head and facial exam is normal.  The neck is supple without meningeal signs.  No significant adenopathy.  Pulmonary Exam: No respiratory distress.  The respiratory rate is normal.  No stridor.  The breath sounds are equal bilaterally.  There are no wheezes, rales, or rhonchi noted. Cardiac Exam: The cardiac rate and rhythm are normal.  No significant murmurs, rubs, or gallops.  The peripheral pulses are normal.  Abdominal Exam: Abdomen is soft and non-distended.  No pulsatile masses.  There is no local tenderness.  There is no rebound or guarding noted.  Skin and Soft Tissue: The skin is warm and dry, without significant abnormality.  Good color.  Musculoskeletal Exam: No peripheral edema.  The musculoskeletal exam of the lower extremities is normal without significant local tenderness. Patient has mild midline low lumbar tenderness and pain on ROM with R hip with no swelling, redness, or deformity.   Psychiatric: Normal adult with appropriate demeanor and interpersonal interaction.  Is oriented to person, place, and time.      Diagnostic Study Results     Labs -  Recent Results (from the past 12 hour(s))   GLUCOSE, POC    Collection Time: 01/23/17 10:56 AM   Result Value Ref Range    Glucose (POC) 270 (H) 70 - 110 mg/dL   GLUCOSE, POC    Collection Time: 01/23/17 11:44 AM   Result Value Ref Range    Glucose (POC) 266 (H) 70 - 110 mg/dL   URINALYSIS W/ RFLX MICROSCOPIC    Collection Time: 01/23/17 11:45 AM   Result Value Ref Range    Color YELLOW       Appearance CLEAR      Specific gravity 1.023 1.005 - 1.030      pH (UA) 5.0 5.0 -  8.0      Protein NEGATIVE  NEG mg/dL    Glucose >1000 (A) NEG mg/dL    Ketone TRACE (A) NEG mg/dL    Bilirubin NEGATIVE  NEG      Blood NEGATIVE  NEG      Urobilinogen 0.2 0.2 - 1.0 EU/dL    Nitrites NEGATIVE  NEG      Leukocyte Esterase NEGATIVE  NEG     CBC WITH AUTOMATED DIFF    Collection Time: 01/23/17 11:45 AM   Result Value Ref Range    WBC 11.9 4.6 - 13.2 K/uL    RBC 5.33 (H) 4.20 - 5.30 M/uL    HGB 17.1 (H) 12.0 - 16.0 g/dL    HCT 47.9 (H) 35.0 - 45.0 %    MCV 89.9 74.0 - 97.0 FL    MCH 32.1 24.0 - 34.0 PG    MCHC 35.7 31.0 - 37.0 g/dL    RDW 12.3 11.6 - 14.5 %    PLATELET 243 135 - 420 K/uL    MPV 10.6 9.2 - 11.8 FL    NEUTROPHILS 58 40 - 73 %    LYMPHOCYTES 36 21 - 52 %    MONOCYTES 5 3 - 10 %    EOSINOPHILS 1 0 - 5 %    BASOPHILS 0 0 - 2 %    ABS. NEUTROPHILS 6.8 1.8 - 8.0 K/UL    ABS. LYMPHOCYTES 4.3 (H) 0.9 - 3.6 K/UL    ABS. MONOCYTES 0.6 0.05 - 1.2 K/UL    ABS. EOSINOPHILS 0.2 0.0 - 0.4 K/UL    ABS. BASOPHILS 0.1 (H) 0.0 - 0.06 K/UL    DF AUTOMATED     HCG QL SERUM    Collection Time: 01/23/17 11:45 AM   Result Value Ref Range    HCG, Ql. NEGATIVE  NEG     PH, VENOUS    Collection Time: 01/23/17 11:45 AM   Result Value Ref Range    VENOUS PH 7.37 7.32 - 6.76     METABOLIC PANEL, COMPREHENSIVE    Collection Time: 01/23/17 12:30 PM   Result Value Ref Range    Sodium 138 136 - 145 mmol/L    Potassium 3.5 3.5 - 5.5 mmol/L    Chloride 108 100 - 108 mmol/L    CO2 24 21 - 32 mmol/L    Anion gap 6 3.0 - 18 mmol/L    Glucose 239 (H) 74 - 99 mg/dL    BUN 13 7.0 - 18 MG/DL    Creatinine 0.41 (L) 0.6 - 1.3 MG/DL    BUN/Creatinine ratio 32 (H) 12 - 20      GFR est AA >60 >60 ml/min/1.69m    GFR est non-AA >60 >60 ml/min/1.739m   Calcium 8.1 (L) 8.5 - 10.1 MG/DL    Bilirubin, total 0.3 0.2 - 1.0 MG/DL    ALT (SGPT) 40 13 - 56 U/L    AST (SGOT) 25 15 - 37 U/L    Alk. phosphatase 172 (H) 45 - 117 U/L     Protein, total 6.7 6.4 - 8.2 g/dL    Albumin 3.2 (L) 3.4 - 5.0 g/dL    Globulin 3.5 2.0 - 4.0 g/dL    A-G Ratio 0.9 0.8 - 1.7         Radiologic Studies -   XR HIP RT W OR WO PELV 2-3 VWS   Final Result   IMPRESSION:  ??  No acute osseous injury.  Medical Decision Making   I am the first provider for this patient.    I reviewed the vital signs, available nursing notes, past medical history, past surgical history, family history and social history.    Vital Signs-Reviewed the patient's vital signs.    Pulse Oximetry Analysis -  99% on room air (Interpretation)    Records Reviewed: Nursing Notes and Triage notes  (Time of Review: 11:16 AM)    ED Course: Progress Notes, Reevaluation, and Consults:  None     Provider Notes (Medical Decision Making): Pt with NIDDM with elevated blood sugar and chronic low back pain. No red flag signs/symptoms to suggest cord compression. Labs reviewed. Blood sugar elevated but pt not in DKA. No signs of infection. Received 1 L IV fluids. Will increase metformin dose. Life Coach has seen pt and scheduled a PCP appointment for her next week. Pt and daughter advised on return precautions and comfortable with plan for discharge.     Diagnosis     Clinical Impression:   1. Non-insulin dependent type 2 diabetes mellitus ([place])    2. Hyperglycemia    3. Chronic midline low back pain without sciatica        Disposition: Discharged     Follow-up Information     Follow up With Details Comments Contact Info    Elnora Morrison, MD Go on 01/29/2017 at 12:30PM [address]  [place] [address]  [phone]      [facility] EMERGENCY DEPT  If symptoms worsen [place] [address]  [phone]           Patient's Medications   Start Taking    LIDOCAINE (LIDODERM) 5 %    Apply patch to the affected area for 12 hours a day and remove for 12 hours a day.    METFORMIN (GLUCOPHAGE) 1,000 MG TABLET    Take 1 Tab by mouth two (2) times daily (with meals) for 14 days.     NAPROXEN (NAPROSYN) 500 MG TABLET    Take 1 Tab by mouth two (2) times daily (with meals) for 10 days.   Continue Taking    ACETAMINOPHEN (TYLENOL EXTRA STRENGTH) 500 MG TABLET    Take 2 Tabs by mouth every six (6) hours as needed for Pain.    CYCLOBENZAPRINE (FLEXERIL) 10 MG TABLET    Take 1 Tab by mouth two (2) times daily as needed for Muscle Spasm(s).   These Medications have changed    No medications on file   Stop Taking    IBUPROFEN (MOTRIN) 600 MG TABLET    Take 1 Tab by mouth every eight (8) hours.    METFORMIN (GLUCOPHAGE) 500 MG TABLET    Take  by mouth two (2) times daily (with meals).    OXYCODONE-ACETAMINOPHEN (PERCOCET) 5-325 MG PER TABLET    Take 1 Tab by mouth every four (4) hours as needed for Pain. Max Daily Amount: 6 Tabs.     _______________________________    Attestations:  Scribe Attestation     Alayne N Hawthorne acting as a Education administrator for and in the presence of Bearl Mulberry, MD      January 23, 2017 at 11:16 AM       Provider Attestation:      I personally performed the services described in the documentation, reviewed the documentation, as recorded by the scribe in my presence, and it accurately and completely records my words and actions. January 23, 2017  at 11:16 AM - Bearl Mulberry, MD    _______________________________

## 2017-01-23 NOTE — ED Notes (Signed)
Patient stated understanding of discharge instructions. Patient was ambulatory upon discharge. Patient received three prescriptions.    Patient armband removed and shredded

## 2017-01-23 NOTE — Progress Notes (Signed)
Approached by Dr. Antonieta Loveless pt having difficulties finding a PCP. Briefly met with pt at the bedside in ED, bed 7. Pt states that she has insurance, however she has a $6,000 deductible. Explained to pt that I can assist her with setting up a PCP with a [employer], however she is still responsible for her deductible. Appointment is scheduled with Dr. Murlean Hark for Wednesday, 01/29/17 @ 12:30PM.        Chad Cordial RN, BSN, [facility]  ED Outcomes Manager  Thursdays & Fridays   [phone] (phone)  [phone] (pager)

## 2017-01-29 ENCOUNTER — Ambulatory Visit
Admit: 2017-01-29 | Discharge: 2017-01-29 | Payer: PRIVATE HEALTH INSURANCE | Attending: Internal Medicine | Primary: Nurse Practitioner

## 2017-01-29 ENCOUNTER — Inpatient Hospital Stay: Admit: 2017-01-29 | Payer: PRIVATE HEALTH INSURANCE | Primary: Nurse Practitioner

## 2017-01-29 DIAGNOSIS — E119 Type 2 diabetes mellitus without complications: Secondary | ICD-10-CM

## 2017-01-29 DIAGNOSIS — R232 Flushing: Secondary | ICD-10-CM

## 2017-01-29 MED ORDER — CYCLOBENZAPRINE 10 MG TAB
10 mg | ORAL_TABLET | Freq: Three times a day (TID) | ORAL | 1 refills | Status: DC | PRN
Start: 2017-01-29 — End: 2017-06-09

## 2017-01-29 MED ORDER — BLOOD SUGAR DIAGNOSTIC TEST STRIPS
ORAL_STRIP | 3 refills | Status: DC
Start: 2017-01-29 — End: 2017-06-09

## 2017-01-29 MED ORDER — LANCETS
11 refills | Status: DC
Start: 2017-01-29 — End: 2017-06-09

## 2017-01-29 MED ORDER — GABAPENTIN 300 MG CAP
300 mg | ORAL_CAPSULE | Freq: Two times a day (BID) | ORAL | 3 refills | Status: DC
Start: 2017-01-29 — End: 2017-05-15

## 2017-01-29 MED ORDER — CLOTRIMAZOLE 1 % TOPICAL CREAM
1 % | Freq: Two times a day (BID) | CUTANEOUS | 0 refills | Status: DC
Start: 2017-01-29 — End: 2017-07-09

## 2017-01-29 MED ORDER — METRONIDAZOLE 1 % TOPICAL CREAM
1 % | Freq: Every day | CUTANEOUS | 2 refills | Status: DC
Start: 2017-01-29 — End: 2017-02-02

## 2017-01-29 MED ORDER — INSULIN ASPART 100 UNIT/ML (3 ML) SUB-Q PEN
100 unit/mL (3 mL) | PEN_INJECTOR | SUBCUTANEOUS | 3 refills | Status: DC
Start: 2017-01-29 — End: 2017-01-29

## 2017-01-29 NOTE — Telephone Encounter (Signed)
Insurance does not cover below medications, please advise.
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
## 2017-01-29 NOTE — Telephone Encounter (Signed)
Patient walked in the office requesting a prior authorization for clotrimazole (LOTRIMIN) 1 % topical cream [[id number]]   and metroNIDAZOLE (NORITATE) 1 % topical cream [[id number] Asking to be called back.

## 2017-01-30 LAB — SED RATE (ESR): Sed rate, automated: 28 mm/hr — ABNORMAL HIGH (ref 0–20)

## 2017-01-30 LAB — LIPID PANEL
CHOL/HDL Ratio: 3.6 (ref 0–5.0)
Cholesterol, total: 178 MG/DL (ref <200)
HDL Cholesterol: 50 MG/DL (ref 40–60)
LDL, calculated: 95.4 MG/DL (ref 0–100)
Triglyceride: 163 MG/DL — ABNORMAL HIGH (ref <150)
VLDL, calculated: 32.6 MG/DL

## 2017-01-30 LAB — TSH 3RD GENERATION: TSH: 2.56 u[IU]/mL (ref 0.36–3.74)

## 2017-01-30 LAB — MICROALBUMIN, UR, RAND W/ MICROALB/CREAT RATIO
Creatinine, urine random: 31.31 mg/dL (ref 30–125)
Microalbumin,urine random: 2.3 MG/DL (ref 0–3.0)
Microalbumin/Creat ratio (mg/g creat): 73 mg/g — ABNORMAL HIGH (ref 0–30)

## 2017-01-30 LAB — FSH AND LH
FSH: 33.2 m[IU]/mL
Luteinizing hormone: 12.3 m[IU]/mL

## 2017-01-30 MED ORDER — INSULIN ASPART PROTAMINE/INSULIN ASPART (70/30) 100 UNIT/ML (3 ML) SUB-Q PEN
100 unit/mL (70-30) | PEN_INJECTOR | SUBCUTANEOUS | 3 refills | Status: DC
Start: 2017-01-30 — End: 2017-02-03

## 2017-01-30 NOTE — Telephone Encounter (Signed)
Pharmacy called, needed clarification on Novolog 70/30 10 units twice day. This encounter will be closed.

## 2017-01-31 NOTE — Telephone Encounter (Signed)
Insurance will cover 75% gel

## 2017-01-31 NOTE — Telephone Encounter (Signed)
Please send Humalog Pen Injection. Please advise.

## 2017-02-02 MED ORDER — METRONIDAZOLE 0.75 % TOPICAL GEL
0.75 % | Freq: Two times a day (BID) | CUTANEOUS | 0 refills | Status: DC
Start: 2017-02-02 — End: 2017-05-15

## 2017-02-03 ENCOUNTER — Encounter

## 2017-02-03 MED ORDER — INSULIN LISPRO PROTAMINE/INSULIN LISPRO (50/50) 100 UNIT/ML (3 ML) SUB-Q PEN
100 unit/mL (50-50) | PEN_INJECTOR | SUBCUTANEOUS | 3 refills | Status: DC
Start: 2017-02-03 — End: 2017-02-04

## 2017-02-03 NOTE — Telephone Encounter (Signed)
Patient called in to check update on request Novolog Pen. States she has been without since last Wednesday.

## 2017-02-03 NOTE — Telephone Encounter (Signed)
Please send Humalog. Please advise.

## 2017-02-04 ENCOUNTER — Encounter

## 2017-02-04 MED ORDER — INSULIN NPH/INSULIN REGULAR (70/30) 100 UNIT/ML INJECTION
100 unit/mL (70-30) | SUBCUTANEOUS | 3 refills | Status: DC
Start: 2017-02-04 — End: 2017-02-12

## 2017-02-04 MED ORDER — INSULIN SYRINGES (DISPOSABLE) 1 ML
1 mL | INJECTION | 3 refills | Status: DC
Start: 2017-02-04 — End: 2017-06-09

## 2017-02-04 MED ORDER — INSULIN NEEDLES (DISPOSABLE) 31 X 5/16"
31 gauge x 5/16" | PACK | 11 refills | Status: DC
Start: 2017-02-04 — End: 2017-03-26

## 2017-02-04 NOTE — Telephone Encounter (Signed)
Patient reporting blood glucose 392. Please advise.

## 2017-02-04 NOTE — Telephone Encounter (Signed)
Patient is unable to afford the insulin at $215.  Can you prescribe something more affordable? Patient has been with any diabetic medication for 6 days.  Please call letting her know what can be done.

## 2017-02-04 NOTE — Telephone Encounter (Addendum)
Pharmacy opens up at 9 am, will contact pharmacy to verify prescription information.     Spoke with patient and advised that total prescription cost is $38. Pt states gabapentin is not working and she was wondering if she can take naprosyn and gabapentin together. Advised patient she can alternate on dosing schedule and she will speak with provider at time of her next appt 02/12/2017. This encounter will be closed.

## 2017-02-12 ENCOUNTER — Encounter: Attending: Internal Medicine | Primary: Nurse Practitioner

## 2017-02-12 ENCOUNTER — Ambulatory Visit
Admit: 2017-02-12 | Discharge: 2017-02-12 | Payer: PRIVATE HEALTH INSURANCE | Attending: Internal Medicine | Primary: Nurse Practitioner

## 2017-02-12 DIAGNOSIS — IMO0002 Reserved for concepts with insufficient information to code with codable children: Secondary | ICD-10-CM

## 2017-02-12 LAB — AMB POC HEMOGLOBIN A1C: Hemoglobin A1c (POC): 12.6 %

## 2017-02-12 MED ORDER — RAMIPRIL 5 MG CAP
5 mg | ORAL_CAPSULE | Freq: Every day | ORAL | 3 refills | Status: DC
Start: 2017-02-12 — End: 2017-03-26

## 2017-02-12 MED ORDER — INSULIN NPH/INSULIN REGULAR (70/30) 100 UNIT/ML INJECTION
100 unit/mL (70-30) | SUBCUTANEOUS | 3 refills | Status: DC
Start: 2017-02-12 — End: 2017-03-26

## 2017-02-12 MED ORDER — CELECOXIB 100 MG CAP
100 mg | ORAL_CAPSULE | Freq: Two times a day (BID) | ORAL | 2 refills | Status: DC
Start: 2017-02-12 — End: 2017-03-26

## 2017-02-12 NOTE — Addendum Note (Signed)
Addended by: London Sheer on: 02/12/2017 08:46 AM      Modules accepted: Orders

## 2017-02-12 NOTE — Progress Notes (Signed)
1. Have you been to the ER, urgent care clinic since your last visit?  Hospitalized since your last visit?No    2. Have you seen or consulted any other health care providers outside of the [facility] since your last visit?  Include any pap smears or colon screening. No

## 2017-02-12 NOTE — Progress Notes (Signed)
Barbara [name] is a 51 y.o.  female and presents with     Chief Complaint   Patient presents with   ??? Diabetes   ??? Hypertension   ??? Back Pain   ??? Hip Pain     Pt says she is taking insulin 10 units twice dailly.  Suagsr are trending down . They sued to be in the 300- 400 range.  Now they are in the 200 range.She has gained some wt.  She has numbness in fet and the Neurontin is helping her some.  She has chronic back pain  She already had surgery but was told she cannot have more as she has scar tissue in the back.  Hip hurts too,  Pt is currently not taking any meds for HTN.      Past Medical History:   Diagnosis Date   ??? Back pain    ??? Breast CA ([place])    ??? Breast cancer ([place]) 2008 or 2009   ??? Diabetes [facility])      Past Surgical History:   Procedure Laterality Date   ??? HX BREAST AUGMENTATION Right 2010     Current Outpatient Prescriptions   Medication Sig   ??? ramipril (ALTACE) 5 mg capsule Take 1 Cap by mouth daily.   ??? celecoxib (CELEBREX) 100 mg capsule Take 1 Cap by mouth two (2) times a day for 90 days.   ??? insulin NPH/insulin regular (NOVOLIN 70/30, HUMULIN 70/30) 100 unit/mL (70-30) injection Administer 20 units subcut twice daily 30 minutes before breakfast and dinner   ??? Insulin Needles, Disposable, 31 gauge x 5/16" ndle DM   ??? Insulin Syringes, Disposable, 1 mL syrg Diagnosis 250.00   ??? metroNIDAZOLE (METROGEL) 0.75 % topical gel Apply  to affected area two (2) times a day.   ??? gabapentin (NEURONTIN) 300 mg capsule Take 1 Cap by mouth two (2) times a day.   ??? clotrimazole (LOTRIMIN) 1 % topical cream Apply  to affected area two (2) times a day.   ??? Lancets misc DM   ??? glucose blood VI test strips (FREESTYLE LITE STRIPS) strip Monitor blood sugars twice   ??? cyclobenzaprine (FLEXERIL) 10 mg tablet Take 1 Tab by mouth three (3) times daily as needed for Muscle Spasm(s).   ??? lidocaine (LIDODERM) 5 % Apply patch to the affected area for 12 hours a day and remove for 12 hours a day.    ??? acetaminophen (TYLENOL EXTRA STRENGTH) 500 mg tablet Take 2 Tabs by mouth every six (6) hours as needed for Pain.   ??? cyclobenzaprine (FLEXERIL) 10 mg tablet Take 1 Tab by mouth two (2) times daily as needed for Muscle Spasm(s).     No current facility-administered medications for this visit.      Health Maintenance   Topic Date Due   ??? FOOT EXAM Q1  11/27/1977   ??? EYE EXAM RETINAL OR DILATED Q1  11/27/1977   ??? Pneumococcal 19-64 Medium Risk (1 of 1 - PPSV23) 11/27/1986   ??? DTaP/Tdap/Td series (1 - Tdap) 11/27/1988   ??? PAP AKA CERVICAL CYTOLOGY  11/27/1988   ??? Influenza Age 34 to Adult  05/14/2017   ??? HEMOGLOBIN A1C Q6M  07/31/2017   ??? MICROALBUMIN Q1  01/29/2018   ??? LIPID PANEL Q1  01/29/2018       There is no immunization history on file for this patient.  No LMP recorded. Patient is not currently having periods (Reason: Premenopausal).  Allergies and Intolerances:   Allergies   Allergen Reactions   ??? Tramadol Anaphylaxis       Family History:   Family History   Problem Relation Age of Onset   ??? Diabetes Mother    ??? No Known Problems Father        Social History:   She  reports that she has been smoking.  She has never used smokeless tobacco.  She  reports that she does not drink alcohol.            Review of Systems:   General: negative for - chills, fatigue, fever, weight change  Psych: negative for - anxiety, depression, irritability or mood swings  ENT: negative for - headaches, hearing change, nasal congestion, oral lesions, sneezing or sore throat  Heme/ Lymph: negative for - bleeding problems, bruising, pallor or swollen lymph nodes  Endo: negative for - hot flashes, polydipsia/polyuria or temperature intolerance  Resp: negative for - cough, shortness of breath or wheezing  CV: negative for - chest pain, edema or palpitations  GI: negative for - abdominal pain, change in bowel habits, constipation, diarrhea or nausea/vomiting  GU: negative for - dysuria, hematuria, incontinence, pelvic pain or  vulvar/vaginal symptoms  MSK: negative for - joint pain, joint swelling or muscle pain, pos for bac kpain , hip pain  Neuro: negative for - confusion, headaches, seizures or weakness  Derm: negative for - dry skin, hair changes, rash or skin lesion changes          Physical:   Vitals:   Vitals:    02/12/17 0824 02/12/17 0827   BP: (!) 145/93 (!) 143/97   Pulse: 97    Resp: 15    Temp: 97.9 ??F (36.6 ??C)    TempSrc: Oral    SpO2: 97%    Weight: 139 lb (63 kg)    Height: 5\' 2"  (1.575 m)            Exam:   HEENT- atraumatic,normocephalic, awake, oriented, well nourished  Neck - supple,no enlarged lymph nodes, no JVD, no thyromegaly  Chest- CTA, no rhonchi, no crackles  Heart- rrr, no murmurs / gallop/rub  Abdomen- soft,BS+,NT, no hepatosplenomegaly  Ext - no c/c/edema   Neuro- no focal deficits.Power 5/5 all extremities  Skin - warm,dry, no obvious rashes.          Review of Data:   LABS:   Lab Results   Component Value Date/Time    WBC 11.9 01/23/2017 11:45 AM    HGB 17.1 (H) 01/23/2017 11:45 AM    HCT 47.9 (H) 01/23/2017 11:45 AM    PLATELET 243 01/23/2017 11:45 AM     Lab Results   Component Value Date/Time    Sodium 138 01/23/2017 12:30 PM    Potassium 3.5 01/23/2017 12:30 PM    Chloride 108 01/23/2017 12:30 PM    CO2 24 01/23/2017 12:30 PM    Glucose 239 (H) 01/23/2017 12:30 PM    BUN 13 01/23/2017 12:30 PM    Creatinine 0.41 (L) 01/23/2017 12:30 PM     Lab Results   Component Value Date/Time    Cholesterol, total 178 01/29/2017 01:30 PM    HDL Cholesterol 50 01/29/2017 01:30 PM    LDL, calculated 95.4 01/29/2017 01:30 PM    Triglyceride 163 (H) 01/29/2017 01:30 PM     No results found for: GPT        Impression / Plan:        ICD-10-CM ICD-9-CM  1. DM type 2, uncontrolled, with neuropathy (HCC) E11.40 250.62 ramipril (ALTACE) 5 mg capsule    E11.65 357.2 insulin NPH/insulin regular (NOVOLIN 70/30, HUMULIN 70/30) 100 unit/mL (70-30) injection      REFERRAL TO OPHTHALMOLOGY    2. Essential hypertension I10 401.9 ramipril (ALTACE) 5 mg capsule   3. Bilateral back pain, unspecified back location, unspecified chronicity M54.9 724.5 REFERRAL TO PAIN MANAGEMENT      celecoxib (CELEBREX) 100 mg capsule     Sugars improving, increase insulin to 20 units bid , watch for hypoglycemia    May need to increase neurontin dose.      Explained to patient risk benefits of the medications.Advised patient to stop meds if having any side effects.Pt verbalized understanding of the instructions.    I have discussed the diagnosis with the patient and the intended plan as seen in the above orders.  The patient has received an after-visit summary and questions were answered concerning future plans.  I have discussed medication side effects and warnings with the patient as well. I have reviewed the plan of care with the patient, accepted their input and they are in agreement with the treatment goals.     Reviewed plan of care. Patient has provided input and agrees with goals.    Follow-up Disposition:  Return in about 6 weeks (around 03/26/2017).    Elnora Morrison, MD

## 2017-03-03 ENCOUNTER — Inpatient Hospital Stay: Admit: 2017-03-03 | Payer: PRIVATE HEALTH INSURANCE | Attending: Internal Medicine | Primary: Nurse Practitioner

## 2017-03-03 DIAGNOSIS — M1612 Unilateral primary osteoarthritis, left hip: Secondary | ICD-10-CM

## 2017-03-26 ENCOUNTER — Ambulatory Visit
Admit: 2017-03-26 | Discharge: 2017-03-26 | Payer: PRIVATE HEALTH INSURANCE | Attending: Internal Medicine | Primary: Nurse Practitioner

## 2017-03-26 DIAGNOSIS — IMO0002 Reserved for concepts with insufficient information to code with codable children: Secondary | ICD-10-CM

## 2017-03-26 LAB — AMB POC GLUCOSE BLOOD, BY GLUCOSE MONITORING DEVICE: Glucose POC: 173 mg/dL

## 2017-03-26 MED ORDER — INSULIN NEEDLES (DISPOSABLE) 31 X 5/16"
31 gauge x 5/16" | PACK | 11 refills | Status: DC
Start: 2017-03-26 — End: 2017-06-09

## 2017-03-26 MED ORDER — INSULIN NPH/INSULIN REGULAR (70/30) 100 UNIT/ML INJECTION
100 unit/mL (70-30) | SUBCUTANEOUS | 3 refills | Status: DC
Start: 2017-03-26 — End: 2017-03-26

## 2017-03-26 MED ORDER — INSULIN NPH/INSULIN REGULAR (70/30) 100 UNIT/ML INJECTION
100 unit/mL (70-30) | SUBCUTANEOUS | 3 refills | Status: DC
Start: 2017-03-26 — End: 2017-05-15

## 2017-03-26 MED ORDER — RAMIPRIL 10 MG CAP
10 mg | ORAL_CAPSULE | Freq: Every day | ORAL | 3 refills | Status: DC
Start: 2017-03-26 — End: 2017-08-12

## 2017-03-26 NOTE — Progress Notes (Signed)
Barbara [name] is a 51 y.o.  female and presents with     Chief Complaint   Patient presents with   ??? Diabetes   ??? Hypertension   ??? Hand Pain   ??? Back Pain     Pt says she is taking insulin 20 units twice daily.Her sugars are improving.  Most of the time blood sugars in the morning are still un 150 range.  Pt does have pain in her hands and has swelling on the palms.She has hard time bending it.She is taking ramilpril but blood pressure is still slightly up.  She has gained wt on insulin and feels like she has more energy.        Past Medical History:   Diagnosis Date   ??? Back pain    ??? Breast CA ([place])    ??? Breast cancer ([place]) 2008 or 2009   ??? Diabetes [facility])      Past Surgical History:   Procedure Laterality Date   ??? HX BREAST AUGMENTATION Right 2010     Current Outpatient Prescriptions   Medication Sig   ??? Insulin Needles, Disposable, 31 gauge x 5/16" ndle DM   ??? insulin NPH/insulin regular (NOVOLIN 70/30, HUMULIN 70/30) 100 unit/mL (70-30) injection Administer 24 units subcut twice daily 30 minutes before breakfast and dinner   ??? ramipril (ALTACE) 10 mg capsule Take 1 Cap by mouth daily.   ??? Insulin Syringes, Disposable, 1 mL syrg Diagnosis 250.00   ??? metroNIDAZOLE (METROGEL) 0.75 % topical gel Apply  to affected area two (2) times a day.   ??? gabapentin (NEURONTIN) 300 mg capsule Take 1 Cap by mouth two (2) times a day.   ??? clotrimazole (LOTRIMIN) 1 % topical cream Apply  to affected area two (2) times a day.   ??? Lancets misc DM   ??? glucose blood VI test strips (FREESTYLE LITE STRIPS) strip Monitor blood sugars twice   ??? cyclobenzaprine (FLEXERIL) 10 mg tablet Take 1 Tab by mouth three (3) times daily as needed for Muscle Spasm(s).   ??? lidocaine (LIDODERM) 5 % Apply patch to the affected area for 12 hours a day and remove for 12 hours a day.   ??? acetaminophen (TYLENOL EXTRA STRENGTH) 500 mg tablet Take 2 Tabs by mouth every six (6) hours as needed for Pain.    ??? cyclobenzaprine (FLEXERIL) 10 mg tablet Take 1 Tab by mouth two (2) times daily as needed for Muscle Spasm(s).     No current facility-administered medications for this visit.      Health Maintenance   Topic Date Due   ??? FOOT EXAM Q1  11/27/1977   ??? EYE EXAM RETINAL OR DILATED Q1  11/27/1977   ??? Pneumococcal 19-64 Medium Risk (1 of 1 - PPSV23) 11/27/1986   ??? DTaP/Tdap/Td series (1 - Tdap) 11/27/1988   ??? PAP AKA CERVICAL CYTOLOGY  11/27/1988   ??? Influenza Age 78 to Adult  05/14/2017   ??? HEMOGLOBIN A1C Q6M  07/31/2017   ??? MICROALBUMIN Q1  01/29/2018   ??? LIPID PANEL Q1  01/29/2018     There is no immunization history for the selected administration types on file for this patient.  No LMP recorded. Patient is not currently having periods (Reason: Premenopausal).        Allergies and Intolerances:   Allergies   Allergen Reactions   ??? Tramadol Anaphylaxis       Family History:   Family History   Problem Relation Age of  Onset   ??? Diabetes Mother    ??? No Known Problems Father        Social History:   She  reports that she has been smoking.  She has never used smokeless tobacco.  She  reports that she does not drink alcohol.            Review of Systems:   General: negative for - chills, fatigue, fever, pos for weight change  Psych: negative for - anxiety, depression, irritability or mood swings  ENT: negative for - headaches, hearing change, nasal congestion, oral lesions, sneezing or sore throat  Heme/ Lymph: negative for - bleeding problems, bruising, pallor or swollen lymph nodes  Endo: negative for - hot flashes, polydipsia/polyuria or temperature intolerance  Resp: negative for - cough, shortness of breath or wheezing  CV: negative for - chest pain, edema or palpitations  GI: negative for - abdominal pain, change in bowel habits, constipation, diarrhea or nausea/vomiting  GU: negative for - dysuria, hematuria, incontinence, pelvic pain or vulvar/vaginal symptoms   MSK: negative for - joint pain, joint swelling or muscle pain  Neuro: negative for - confusion, headaches, seizures or weakness  Derm: negative for - dry skin, hair changes, rash or skin lesion changes, pos for pain in hand          Physical:   Vitals:   Vitals:    03/26/17 0819 03/26/17 0822   BP: (!) 153/93 146/84   Pulse: 81    Resp: 16    Temp: 97 ??F (36.1 ??C)    TempSrc: Oral    SpO2: 97%    Weight: 143 lb (64.9 kg)    Height: 5\' 2"  (1.575 m)            Exam:   HEENT- atraumatic,normocephalic, awake, oriented, well nourished  Neck - supple,no enlarged lymph nodes, no JVD, no thyromegaly  Chest- CTA, no rhonchi, no crackles  Heart- rrr, no murmurs / gallop/rub  Abdomen- soft,BS+,NT, no hepatosplenomegaly  Ext - no c/c/edema , dupuytrens contr of hand   Neuro- no focal deficits.Power 5/5 all extremities  Skin - warm,dry, no obvious rashes.          Review of Data:   LABS:   Lab Results   Component Value Date/Time    WBC 11.9 01/23/2017 11:45 AM    HGB 17.1 (H) 01/23/2017 11:45 AM    HCT 47.9 (H) 01/23/2017 11:45 AM    PLATELET 243 01/23/2017 11:45 AM     Lab Results   Component Value Date/Time    Sodium 138 01/23/2017 12:30 PM    Potassium 3.5 01/23/2017 12:30 PM    Chloride 108 01/23/2017 12:30 PM    CO2 24 01/23/2017 12:30 PM    Glucose 239 (H) 01/23/2017 12:30 PM    BUN 13 01/23/2017 12:30 PM    Creatinine 0.41 (L) 01/23/2017 12:30 PM     Lab Results   Component Value Date/Time    Cholesterol, total 178 01/29/2017 01:30 PM    HDL Cholesterol 50 01/29/2017 01:30 PM    LDL, calculated 95.4 01/29/2017 01:30 PM    Triglyceride 163 (H) 01/29/2017 01:30 PM     No results found for: GPT        Impression / Plan:        ICD-10-CM ICD-9-CM    1. 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 Encounter for vaccination Z23 V05.9 PNEUMOCOCCAL POLYSACCHARIDE VACCINE, 23-VALENT, ADULT OR IMMUNOSUPPRESSED PT DOSE,   3. Essential hypertension I10 401.9 ramipril (ALTACE) 10 mg capsule   4. Dupuytren contracture M72.0 728.6 REFERRAL TO HAND SURGERY     HTN - increase ramiprl to 10 mg po hs    DM - increase insulin to 24 units bid.      Explained to patient risk benefits of the medications.Advised patient to stop meds if having any side effects.Pt verbalized understanding of the instructions.    I have discussed the diagnosis with the patient and the intended plan as seen in the above orders.  The patient has received an after-visit summary and questions were answered concerning future plans.  I have discussed medication side effects and warnings with the patient as well. I have reviewed the plan of care with the patient, accepted their input and they are in agreement with the treatment goals.     Reviewed plan of care. Patient has provided input and agrees with goals.    Follow-up Disposition: Not on File    Elnora Morrison, MD

## 2017-03-26 NOTE — Progress Notes (Signed)
After obtaining consent, and per orders of Dr. Murlean Hark, injection of Pneumovax 23 given by London Sheer, LPN. Patient instructed to remain in clinic for 20 minutes afterwards, and to report any adverse reaction to me immediately. Most recent VIS given.

## 2017-03-27 LAB — FUNDUS PHOTOGRAPHY
Result: NEGATIVE
Result: NEGATIVE
Result: NEGATIVE
Result: NEGATIVE
SEVERITY: NORMAL
SEVERITY: NORMAL

## 2017-03-27 NOTE — Telephone Encounter (Signed)
Pt is asking if she can get a handicapped parking decal due to her hip and back pain. Please advise.

## 2017-03-28 NOTE — Telephone Encounter (Signed)
Please advise.

## 2017-03-31 NOTE — Telephone Encounter (Signed)
??   Will have to address it during next visit,     Pt scheduled an follow appt. This encounter will be closed.

## 2017-04-03 ENCOUNTER — Ambulatory Visit
Admit: 2017-04-03 | Discharge: 2017-04-03 | Payer: PRIVATE HEALTH INSURANCE | Attending: Internal Medicine | Primary: Nurse Practitioner

## 2017-04-03 DIAGNOSIS — Z1239 Encounter for other screening for malignant neoplasm of breast: Secondary | ICD-10-CM

## 2017-04-03 MED ORDER — BACK BRACE
0 refills | Status: DC
Start: 2017-04-03 — End: 2017-06-09
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
## 2017-04-08 NOTE — Telephone Encounter (Signed)
Pt. Is checking the status of hand surgery referral. She stated that her hand started to lock up and cramp a few days ago. She is asking to be called back. Please assist.

## 2017-04-09 NOTE — Telephone Encounter (Signed)
More information documented under referral. Closing encounter.

## 2017-04-30 ENCOUNTER — Encounter

## 2017-04-30 ENCOUNTER — Inpatient Hospital Stay: Admit: 2017-04-30 | Payer: PRIVATE HEALTH INSURANCE | Attending: Internal Medicine | Primary: Nurse Practitioner

## 2017-04-30 DIAGNOSIS — Z1231 Encounter for screening mammogram for malignant neoplasm of breast: Secondary | ICD-10-CM

## 2017-05-02 ENCOUNTER — Encounter

## 2017-05-02 NOTE — Telephone Encounter (Signed)
Please advise. Pt is aware that it is being sent to a different provider for results.

## 2017-05-02 NOTE — Telephone Encounter (Signed)
Mammogram showed indeterminate microcalcifications on left breast. A diagnostic left breast mammogram and  possible left breast ultrasound were recommended for further evaluation. The radiology department will contact her to schedule these.  Please inform patient.  ??

## 2017-05-02 NOTE — Telephone Encounter (Signed)
Nurse informed patient. Pt verbalized understanding, this encounter will be closed.

## 2017-05-02 NOTE — Telephone Encounter (Signed)
Pt called in to find out if the results from her mammogram were back. She stated she would like for a nurse to give her a call with the results as soon as possible. Please assist

## 2017-05-09 NOTE — Telephone Encounter (Signed)
Pt advised to have pain management send dismissal letter to provider. Pt is aware that this is not an guarantee that she will receive pain medication. Advised patient to go to the ER if pain persist. Pt verbalized understanding, this encounter will be closed.

## 2017-05-09 NOTE — Telephone Encounter (Signed)
Patient is no longer going to Pain Management. They will not see her until an MRI is done. She has been denied for MRI. She wants to know if there is something [facility] can do to relieve her pain so she can sleep.

## 2017-05-11 ENCOUNTER — Inpatient Hospital Stay
Admit: 2017-05-11 | Discharge: 2017-05-11 | Disposition: A | Discharge status: Home / Self Care | Payer: PRIVATE HEALTH INSURANCE | Attending: Emergency Medicine

## 2017-05-11 DIAGNOSIS — G8929 Other chronic pain: Secondary | ICD-10-CM

## 2017-05-11 MED ORDER — AMOXICILLIN 500 MG TABLET
500 mg | ORAL_TABLET | Freq: Two times a day (BID) | ORAL | 0 refills | Status: AC
Start: 2017-05-11 — End: 2017-05-18

## 2017-05-11 MED ORDER — LIDOCAINE 4 % TOPICAL PATCH (12 HOUR DURATION)
4 % | CUTANEOUS | Status: DC
Start: 2017-05-11 — End: 2017-05-11

## 2017-05-11 MED ORDER — ACETAMINOPHEN 500 MG TAB
500 mg | ORAL_TABLET | Freq: Four times a day (QID) | ORAL | 0 refills | Status: AC | PRN
Start: 2017-05-11 — End: 2017-05-21

## 2017-05-11 MED ORDER — AMOXICILLIN 250 MG CAP
250 mg | ORAL | Status: AC
Start: 2017-05-11 — End: 2017-05-11
  Administered 2017-05-11: 11:00:00 via ORAL

## 2017-05-11 MED ORDER — NAPROXEN 500 MG TAB
500 mg | ORAL_TABLET | Freq: Two times a day (BID) | ORAL | 0 refills | Status: AC
Start: 2017-05-11 — End: 2017-05-21

## 2017-05-11 MED ORDER — MORPHINE 15 MG TAB
15 mg | ORAL | Status: DC | PRN
Start: 2017-05-11 — End: 2017-05-11
  Administered 2017-05-11: 11:00:00 via ORAL

## 2017-05-11 MED ORDER — ACETAMINOPHEN 500 MG TAB
500 mg | ORAL | Status: AC
Start: 2017-05-11 — End: 2017-05-11
  Administered 2017-05-11: 11:00:00 via ORAL

## 2017-05-11 MED ORDER — NAPROXEN 250 MG TAB
250 mg | Freq: Once | ORAL | Status: AC
Start: 2017-05-11 — End: 2017-05-11
  Administered 2017-05-11: 11:00:00 via ORAL

## 2017-05-11 MED FILL — AMOXICILLIN 250 MG CAP: 250 mg | ORAL | Qty: 2

## 2017-05-11 MED FILL — MAPAP EXTRA STRENGTH 500 MG TABLET: 500 mg | ORAL | Qty: 2

## 2017-05-11 MED FILL — MORPHINE 15 MG TAB: 15 mg | ORAL | Qty: 1

## 2017-05-11 MED FILL — ASPERCREME (LIDOCAINE) 4 % TOPICAL PATCH: 4 % | CUTANEOUS | Qty: 1

## 2017-05-11 MED FILL — NAPROXEN 250 MG TAB: 250 mg | ORAL | Qty: 2

## 2017-05-11 MED FILL — LIDOCAINE 4 % TOPICAL PATCH (8 HOUR DURATION): 4 % | CUTANEOUS | Qty: 1

## 2017-05-11 NOTE — ED Notes (Signed)
Pt report given to Janett Billow B., Rn. No longer assuming care of patient

## 2017-05-11 NOTE — ED Notes (Signed)
Pt medicated as ordered  Heat packs applied as ordered

## 2017-05-11 NOTE — ED Notes (Signed)
Pt discharged to home ambulatory and in company of friend  Discharge instructions provided via discussion and handout.   Teaching to patient   Verbalized understanding.   No questions voiced.   Discharged with 3 RX.
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
## 2017-05-13 ENCOUNTER — Inpatient Hospital Stay: Admit: 2017-05-13 | Payer: PRIVATE HEALTH INSURANCE | Attending: Internal Medicine | Primary: Nurse Practitioner

## 2017-05-13 ENCOUNTER — Encounter

## 2017-05-13 DIAGNOSIS — R928 Other abnormal and inconclusive findings on diagnostic imaging of breast: Secondary | ICD-10-CM

## 2017-05-13 DIAGNOSIS — N6322 Unspecified lump in the left breast, upper inner quadrant: Secondary | ICD-10-CM

## 2017-05-13 NOTE — Telephone Encounter (Signed)
Pt stated she had a phone call from someone here at the office, I looked over the notes and advised that I didn't see a note of a phone call for today. Pt stated she would like for someone to call her back. Please assist

## 2017-05-13 NOTE — Telephone Encounter (Signed)
I did not call the patient. It may be from the breast center. The radiologist called about this patient and they found a suspicious lesion on her left breast and they're planning to biopsy.

## 2017-05-13 NOTE — Telephone Encounter (Signed)
By chance did you call patient?

## 2017-05-14 NOTE — Telephone Encounter (Signed)
Spoke with patient and states that they called her back. Reminded pt of appnt for tmrw at 8am.

## 2017-05-15 ENCOUNTER — Ambulatory Visit
Admit: 2017-05-15 | Discharge: 2017-05-15 | Payer: PRIVATE HEALTH INSURANCE | Attending: Internal Medicine | Primary: Nurse Practitioner

## 2017-05-15 DIAGNOSIS — F419 Anxiety disorder, unspecified: Secondary | ICD-10-CM

## 2017-05-15 MED ORDER — GABAPENTIN 300 MG CAP
300 mg | ORAL_CAPSULE | Freq: Two times a day (BID) | ORAL | 0 refills | Status: DC
Start: 2017-05-15 — End: 2017-06-09

## 2017-05-15 MED ORDER — LORAZEPAM 0.5 MG TAB
0.5 mg | ORAL_TABLET | Freq: Three times a day (TID) | ORAL | 0 refills | Status: DC | PRN
Start: 2017-05-15 — End: 2017-05-22

## 2017-05-15 MED ORDER — INSULIN NPH/INSULIN REGULAR (70/30) 100 UNIT/ML INJECTION
100 unit/mL (70-30) | SUBCUTANEOUS | 0 refills | Status: DC
Start: 2017-05-15 — End: 2017-05-22

## 2017-05-15 NOTE — Progress Notes (Signed)
History of Present Illness  Barbara [name] is a 51 y.o. female who presents today for management of    Chief Complaint   Patient presents with   ??? Diabetes   ??? Pain (Chronic)       Patient will have left breast biopsy tomorrow. She was found to have a suspicious lump on screening mammogram. She reports of feeling of scared and anxious about it. She has history of right breast cancer, s/p mastectomy and chemoradiation.    She has chronic low back pain and neuropathy. Pain instensity today 5/10. She has numbness and tingling on both feet. No weakness, loss of bowel or bladder control. She takes gabapentin twice daily.    She is also requesting a refill of insulin.   Fasting blood sugar range: 99-130    Past Medical History  Past Medical History:   Diagnosis Date   ??? Back pain    ??? Breast cancer ([place]) 2008 or 2009   ??? Diabetes ([place])    ??? Radiation therapy complication 7425    NOT complication, just radiation        Surgical History  Past Surgical History:   Procedure Laterality Date   ??? HX BREAST AUGMENTATION Right 2010   ??? HX BREAST RECONSTRUCTION Right 2011    per pt - attempted latissimus dorsi flap, then saline implant   ??? HX MASTECTOMY Right 2010    Total mastectomy per pt.     ??? HX MASTOPEXY (BREAST LIFT) Left 2011   ??? IMPLANT BREAST SILICONE/EQ Right 9563    post mastectomy        Current Medications  Current Outpatient Prescriptions   Medication Sig   ??? insulin NPH/insulin regular (NOVOLIN 70/30, HUMULIN 70/30) 100 unit/mL (70-30) injection Administer 24 units subcut twice daily 30 minutes before breakfast and dinner   ??? gabapentin (NEURONTIN) 300 mg capsule Take 1 Cap by mouth two (2) times a day.   ??? LORazepam (ATIVAN) 0.5 mg tablet Take 1 Tab by mouth every eight (8) hours as needed for Anxiety. Max Daily Amount: 1.5 mg.   ??? acetaminophen (TYLENOL EXTRA STRENGTH) 500 mg tablet Take 1 Tab by mouth every six (6) hours as needed for Pain for up to 10 days. No more then 3,000 mg per day    ??? naproxen (NAPROSYN) 500 mg tablet Take 1 Tab by mouth two (2) times daily (with meals) for 10 days.   ??? Back Brace misc Chronic back pain   ??? Insulin Needles, Disposable, 31 gauge x 5/16" ndle DM   ??? ramipril (ALTACE) 10 mg capsule Take 1 Cap by mouth daily.   ??? Insulin Syringes, Disposable, 1 mL syrg Diagnosis 250.00   ??? clotrimazole (LOTRIMIN) 1 % topical cream Apply  to affected area two (2) times a day.   ??? Lancets misc DM   ??? glucose blood VI test strips (FREESTYLE LITE STRIPS) strip Monitor blood sugars twice   ??? cyclobenzaprine (FLEXERIL) 10 mg tablet Take 1 Tab by mouth three (3) times daily as needed for Muscle Spasm(s).   ??? lidocaine (LIDODERM) 5 % Apply patch to the affected area for 12 hours a day and remove for 12 hours a day.   ??? cyclobenzaprine (FLEXERIL) 10 mg tablet Take 1 Tab by mouth two (2) times daily as needed for Muscle Spasm(s).   ??? amoxicillin 500 mg tab Take 500 mg by mouth two (2) times a day for 7 days.     No current facility-administered medications  for this visit.        Allergies/Drug Reactions  Allergies   Allergen Reactions   ??? Tramadol Anaphylaxis        Family History  Family History   Problem Relation Age of Onset   ??? Diabetes Mother    ??? No Known Problems Father         Social History  Social History     Social History   ??? Marital status: MARRIED     Spouse name: N/A   ??? Number of children: N/A   ??? Years of education: N/A     Occupational History   ??? Not on file.     Social History Main Topics   ??? Smoking status: Current Every Day Smoker     Packs/day: 1.00   ??? Smokeless tobacco: Never Used   ??? Alcohol use No   ??? Drug use: No   ??? Sexual activity: Not on file     Other Topics Concern   ??? Not on file     Social History Narrative       Health Maintenance   Topic Date Due   ??? FOOT EXAM Q1  11/27/1977   ??? DTaP/Tdap/Td series (1 - Tdap) 11/27/1988   ??? PAP AKA CERVICAL CYTOLOGY  11/27/1988   ??? Influenza Age 86 to Adult  05/14/2017   ??? HEMOGLOBIN A1C Q6M  07/31/2017    ??? MICROALBUMIN Q1  01/29/2018   ??? LIPID PANEL Q1  01/29/2018   ??? EYE EXAM RETINAL OR DILATED Q1  03/26/2018   ??? Pneumococcal 19-64 Medium Risk  Completed     Immunization History   Administered Date(s) Administered   ??? Pneumococcal Polysaccharide (PPSV-23) 03/26/2017       Review of Systems  General ROS: negative for - chills, fatigue or fever  Psychological ROS: positive for - anxiety  negative for - depression  Respiratory ROS: no cough, shortness of breath, or wheezing  Cardiovascular ROS: no chest pain or dyspnea on exertion  Gastrointestinal ROS: no abdominal pain, change in bowel habits, or black or bloody stools  Musculoskeletal ROS: positive for - pain in back - lower and foot - bilateral  Neurological ROS: positive for - numbness/tingling    Physical Exam  Vital signs:   Vitals:    05/15/17 0804   BP: 155/73   Pulse: 98   Resp: 18   Temp: 96.3 ??F (35.7 ??C)   TempSrc: Oral   SpO2: 97%   Weight: 143 lb (64.9 kg)   Height: 5\' 2"  (1.575 m)       General: alert, oriented, not in distress  Head: scalp normal, atraumatic  Eyes: pupils are equal and reactive, full and intact EOM's  Neck: supple, no JVD, no lymphadenopathy, non-palpable thyroid  Chest/Lungs: clear breath sounds, no wheezing or crackles  Heart: normal rate, regular rhythm, no murmur  Extremities: no focal deformities, no edema  Skin: no active skin lesions    Assessment/Plan:      1. DM type 2, uncontrolled, with neuropathy (HCC)  - check HbA1c  - continue  insulin NPH/insulin regular (NOVOLIN 70/30, HUMULIN 70/30) 100 unit/mL (70-30) injection; Administer 24 units subcut twice daily 30 minutes before breakfast and dinner  Dispense: 10 mL; Refill: 3    2. DM Neuropathy  - gabapentin (NEURONTIN) 300 mg capsule; Take 1 Cap by mouth two (2) times a day.  Dispense: 60 Cap; Refill: 3    3. Other chronic back pain, s/p lumbar decompression  L4/L5  - gabapentin (NEURONTIN) 300 mg capsule; Take 1 Cap by mouth two (2) times a day.  Dispense: 60 Cap; Refill: 3   - Insurance will not pay for MRI of the lumbar spine  - Patient was seeing pain management but was discharged from the practice due to abnormal UDS. Patient was advised to stop using marijuana.  - will defer this to patient's PCP    4. Anxiety  - related to abnormal mammogram, scheduled for left breast lump biopsy tomorrow.  - provided 4 tabs of Ativan 0.5mg  with no refills      Follow-up Disposition:  Return in about 11 days (around 05/26/2017) for rov with pcp.      I have discussed the diagnosis with the patient and the intended plan as seen in the above orders.  The patient has received an after-visit summary and questions were answered concerning future plans.  I have discussed medication side effects and warnings with the patient as well. I have reviewed the plan of care with the patient, accepted their input and they are in agreement with the treatment goals.       Karen Kays, MD  May 15, 2017

## 2017-05-15 NOTE — Progress Notes (Signed)
1. Have you been to the ER, urgent care clinic since your last visit?  Hospitalized since your last visit?No    2. Have you seen or consulted any other health care providers outside of the [facility] since your last visit?  Include any pap smears or colon screening. No       Patient states she got kicked out of pain management due to failing a Urine drug screen. She states she was supposed to do a MRI on her back but it never got authorized, and pain manege ment could not offer anything until she got the MRI done.

## 2017-05-16 ENCOUNTER — Inpatient Hospital Stay: Admit: 2017-05-16 | Payer: PRIVATE HEALTH INSURANCE | Attending: Internal Medicine | Primary: Nurse Practitioner

## 2017-05-16 ENCOUNTER — Encounter

## 2017-05-16 DIAGNOSIS — N6320 Unspecified lump in the left breast, unspecified quadrant: Secondary | ICD-10-CM

## 2017-05-16 LAB — METABOLIC PANEL, COMPREHENSIVE
A-G Ratio: 1.3 ratio (ref 1.1–2.6)
ALT (SGPT): 18 U/L (ref 5–40)
AST (SGOT): 18 U/L (ref 10–37)
Albumin: 4.5 g/dL (ref 3.5–5.0)
Alk. phosphatase: 164 U/L — ABNORMAL HIGH (ref 25–115)
Anion gap: 16 mmol/L
BUN: 12 mg/dL (ref 6–22)
Bilirubin, total: 0.4 mg/dL (ref 0.2–1.2)
CO2: 25 mmol/L (ref 20–32)
Calcium: 9.5 mg/dL (ref 8.4–10.5)
Chloride: 98 mmol/L (ref 98–110)
Creatinine: 0.4 mg/dL — ABNORMAL LOW (ref 0.5–1.2)
GFRAA: >60 (ref >60.0)
GFRNA: >60 (ref >60.0)
Globulin: 3.5 g/dL (ref 2.0–4.0)
Glucose: 241 mg/dL — ABNORMAL HIGH (ref 70–99)
Potassium: 3.9 mmol/L (ref 3.5–5.5)
Protein, total: 8 g/dL (ref 6.4–8.3)
Sodium: 139 mmol/L (ref 133–145)

## 2017-05-16 LAB — CBC WITH AUTOMATED DIFF
ABS. BASOPHILS: 0 10*3/uL (ref 0.0–0.2)
ABS. EOSINOPHILS: 0.2 10*3/uL (ref 0.0–0.5)
ABS. MONOCYTES: 0.6 10*3/uL (ref 0.1–1.0)
ABS. NEUTROPHILS: 6 10*3/uL (ref 1.8–7.7)
ABSOLUTE LYMPHOCYTE COUNT: 4.4 10*3/uL (ref 1.0–4.8)
BASOPHILS: 0 % (ref 0–2)
EOSINOPHILS: 2 % (ref 0–6)
HCT: 46.3 % (ref 35.1–48.0)
HGB: 15.6 g/dL (ref 11.7–16.0)
Lymphocytes: 40 % (ref 20–45)
MCH: 32 pg (ref 26–34)
MCHC: 34 g/dL (ref 31–36)
MCV: 95 fL (ref 80–95)
MONOCYTES: 5 % (ref 3–12)
MPV: 11.1 fL (ref 9.0–13.0)
NEUTROPHILS: 53 % (ref 40–75)
PLATELET: 256 10*3/uL (ref 140–440)
RBC: 4.89 M/uL (ref 3.80–5.20)
RDW: 13.6 % (ref 10.0–15.5)
WBC: 11.2 10*3/uL — ABNORMAL HIGH (ref 4.0–11.0)

## 2017-05-16 LAB — LIPID PANEL
CHOLESTEROL/HDL: 4.2 (ref 0.0–5.0)
Cholesterol, total: 178 mg/dL (ref 110–200)
HDL Cholesterol: 42 mg/dL (ref 40–59)
LDL, calculated: 106 mg/dL — ABNORMAL HIGH (ref 50–99)
Triglyceride: 149 mg/dL (ref 40–149)
VLDL, calculated: 30 mg/dL (ref 8–30)

## 2017-05-16 LAB — HEMOGLOBIN A1C W/O EAG
AVG GLU: 215 mg/dL — ABNORMAL HIGH (ref 91–123)
Hemoglobin A1c: 9.1 % — ABNORMAL HIGH (ref 4.8–5.9)

## 2017-05-16 MED ORDER — LIDOCAINE-EPINEPHRINE 1 %-1:100,000 IJ SOLN
1 %-:00,000 | Freq: Once | INTRAMUSCULAR | Status: AC
Start: 2017-05-16 — End: 2017-05-16
  Administered 2017-05-16: 15:00:00 via SUBCUTANEOUS

## 2017-05-16 MED ORDER — LIDOCAINE (PF) 10 MG/ML (1 %) IJ SOLN
10 mg/mL (1 %) | Freq: Once | INTRAMUSCULAR | Status: AC
Start: 2017-05-16 — End: 2017-05-16
  Administered 2017-05-16: 15:00:00 via INTRADERMAL

## 2017-05-16 MED FILL — LIDOCAINE-EPINEPHRINE 1 %-1:100,000 IJ SOLN: 1 %-:00,000 | INTRAMUSCULAR | Qty: 20

## 2017-05-16 MED FILL — LIDOCAINE (PF) 10 MG/ML (1 %) IJ SOLN: 10 mg/mL (1 %) | INTRAMUSCULAR | Qty: 10

## 2017-05-16 NOTE — Progress Notes (Signed)
Dropped Lt Breast biopsy specimen off with Damian in the pathology at 11:33am.

## 2017-05-20 ENCOUNTER — Encounter

## 2017-05-20 NOTE — Telephone Encounter (Signed)
Nurse spoke to provider concerning biopsy results and referral to Dr. Oleh Genin. Please place referral.

## 2017-05-21 NOTE — Nurse Consult (Signed)
Initial assessment for [facility], newly diagnosed with IDC left breast cancer. Meeting with pt included pt and numerous family members. Patient was assessed for the following barriers:    Communication:       Yes    No  -Ability to read/write,understand English       [x]       []     -Ability to talk with family/children,friends about diagnosis     [x]       []     -Other:  Comments/Referrals/Services provided: She is able to speak with her family.  Employment/Financial/Legal:     Yes    No  -Loss of employment/income         []       [x]     -Insurance coverage          [x]       []      -Needs help applying for Social Security/Disability/FMLA     []       [x]      -Help with Co-Pays for office visits         []       [x]     -Medication assistance/medical supplies or equipment     []       [x]     -Difficulty paying bills: utilities/housing       []       [x]     -Means to buy food                     []       [x]     -Legal issues           []       [x]     -Other:  Comments/Referrals/Services provided: She does not work. She has [employer] and no financial difficulties today.  Psychosocial        Yes    No  Housing:  -Homeless           []       [x]     -Extended care needs: long term care/home care/Hospice     []       [x]     -Other:  Comments/Referrals/Services provided: She lives in an apt.  Transportation:       Yes    No  -Lack of vehicle or public transportation options      []       [x]     -Funds need for public transportation: bus/taxi      []       [x]     -Other:  Comments/Referrals/Services provided: She can drive but her family does the driving.  Support system:       Yes No  -Family members at home: spouse/significant other/children    [x]       []     -Has a support system         [x]       []     Other:  Comments/Referrals/Services provided: She lives with her spouse and two adult daughters. Her sister is here for support.  Spirituality:        Yes No  -Spiritual issues          []       [x]      -Cultural concerns          []       [x]     -Other:  -Comments/Referrals/Services provided: Does not attend church.  Sexuality:        Yes No  -Body image concerns                     []       [  x]    -Relationships/significant other issues        []       [x]     -Other:  Comments/Referrals/Services provided: No concerns today.   Coping:        Yes    No  -Able to manage emotions         []       [x]     -Feeling fearful or anxious         [x]       []     -Interest in attending support groups        []       [x]     -Cancer related pain/control         []       [x]     -Other:  Comments/Referrals/Services provided: Extremely emotional today. Will speak with her PCP about her anxiety and depression. Declines services from [facility] today.  Tobacco dependency:      Yes No  -Currently smokes: cigarettes/cigars        [x]       []     -Uses smokeless tobacco         []       [x]     -Other:  Comments/Referrals/Services provided: Smokes 1 pk every day. Smoking cessation information given to pt.    Education/review of Disease process and Management Yes No  -Explanation of navigator role/contact information      [x]       []     -New Patient Guide for Breast Cancer provided                [x]       []     -Pathology/Staging          [x]       []     -Diagnostic tests          []       [x]     -Genetic testing needed         []       [x]     -Treatment options/plan: surgery/chemotherapy/radiation     [x]       []     -Mediport placement as needed                              []       [x]     -Tobacco cessation as needed        [x]       []     -Need for a second opinion         [x]       []     -Importance of bringing family/friends to medical appts     [x]       []    -Other:  Patient/family verbalized understanding of treatment plan: Yes. She will consult with Dr Oleh Genin to discuss her future treatment plans tomorrow.   NCCN Distress Tool    Barbara [name] was assessed for management of distress using the NCCN Distress Thermometer for Patients tool.       Mild to moderate distress  Scoring:  0-4        Yes    No    -Practical/physical issues       [x]       []       -Provide community resources      [x]       []       -Provide list of support groups      [x]       []       -  Provide list of national organizations and websites               [x]       []       -Provide ongoing supportive care by oncology medical team        [x]       []                   Comments/Referrals/Services provided:  Yes.    Moderate to severe distress  Scoring:  5 or greater                   Yes    No    -Navigator consulted with MD      [x]       []      -Navigator follow up         [x]       []      -Spiritual concerns        []       [x]      -Emotional/family concerns       [x]       []      -Refer to social worker/mental health professional/PCP   [x]       []       Comments/Referral/Services provided: Score: 10. Dr Oleh Genin made aware. New diagnosis to left breast. H/o right breast cancer 10 yrs ago. EXtremely emotional. States she is depressed. She has a f/u ov with Dr Murlean Hark on 05-26-17 and will discuss her diagnosis and her depression with him.  Will be reassessed in the future.       Patient Acuity  0    No navigation        Patient declined services or never returned call    1    Initial education/guidance only        No follow up needed    2    Moderate intensity of needs        Multimodality treatment        Ongoing education/guidance for 5-6 months        No barriers    3    High intensity of needs        Multimodality treatment        Hospitalizations associated with care        Ongoing education/guidance for 6-12 months        Barriers: 1-2    4     High intensity of needs         Multimodality treatment         Coordinated care outside of facility         Ongoing education/guidance for 6-12 months         Barriers: 3 or more    Acuity score: 3

## 2017-05-21 NOTE — Telephone Encounter (Signed)
Referral placed.

## 2017-05-22 ENCOUNTER — Ambulatory Visit
Admit: 2017-05-22 | Discharge: 2017-05-22 | Payer: PRIVATE HEALTH INSURANCE | Attending: Specialist | Primary: Nurse Practitioner

## 2017-05-22 ENCOUNTER — Ambulatory Visit
Admit: 2017-05-22 | Discharge: 2017-05-22 | Payer: PRIVATE HEALTH INSURANCE | Attending: Internal Medicine | Primary: Nurse Practitioner

## 2017-05-22 ENCOUNTER — Inpatient Hospital Stay: Admit: 2017-05-22 | Payer: PRIVATE HEALTH INSURANCE | Primary: Nurse Practitioner

## 2017-05-22 DIAGNOSIS — M2550 Pain in unspecified joint: Secondary | ICD-10-CM

## 2017-05-22 DIAGNOSIS — IMO0002 Reserved for concepts with insufficient information to code with codable children: Secondary | ICD-10-CM

## 2017-05-22 DIAGNOSIS — Z170 Estrogen receptor positive status [ER+]: Secondary | ICD-10-CM

## 2017-05-22 MED ORDER — LORAZEPAM 0.5 MG TAB
0.5 mg | ORAL_TABLET | Freq: Three times a day (TID) | ORAL | 0 refills | Status: DC | PRN
Start: 2017-05-22 — End: 2017-08-12

## 2017-05-22 MED ORDER — INSULIN NPH/INSULIN REGULAR (70/30) 100 UNIT/ML INJECTION
100 unit/mL (70-30) | SUBCUTANEOUS | 0 refills | Status: DC
Start: 2017-05-22 — End: 2018-02-23

## 2017-05-22 NOTE — Progress Notes (Signed)
Appointment:  Tuesday, May 27, 2017 at 5:30pm with Dr. [name] (Plastic Surgeon) located at [address] [place] [address] [phone]  ??

## 2017-05-22 NOTE — Nurse Consult (Signed)
New pt ov with Dr Jennye Boroughs and Dr Oleh Genin. Pt will consult with Dr Ronnald Ramp to discuss breast reconstruction should she decide to have a mastectomy and will return to office for follow up regarding her decision. All questions answered.

## 2017-05-22 NOTE — Progress Notes (Signed)
History of present illness    Barbara [name] comes to the office today for evaluation regarding a new diagnosis of a left breast cancer.  The patient had a recent left breast mammogram that showed an area of calcifications and possibly small mass in about the 1130 position 3 cm from the nipple measuring about 1.8 cm.  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  The tumor is estrogen and progesterone receptor positive but HER-2 is pending.    Of significance is the fact that back in 2008 the patient developed right breast cancer which apparently was fairly large.  She believes she had gene testing at that time when she was 51 years old.  She underwent a mastectomy and apparently had neoadjuvant chemotherapy.  Subsequently she had radiation therapy.  She apparently had some latissimus dorsi flap reconstruction but this failed and then subsequent to that she had an implant.  Subsequent to that she had a reduction mammoplasty of the left breast.  She has not been aware of any recurrent disease since her first cancer until the development of this new cancer in the other breast.    The patient is unhappy with the implant results on her right breast.  She seems to be debating whether or not she just wants to have a mastectomy on the left side and removal of the implant on her right side and then just go with prostheses.  Her other option would be to have expanders and ultimately an implant placed on the left side if we do a mastectomy and then revision of the right side.  Another option that came up during this discussion was having a lumpectomy with radiation therapy on the left side but the patient seems to be leaning in the direction of a mastectomy.    Allergies   Allergen Reactions   ??? Tramadol Anaphylaxis     Past Medical History:   Diagnosis Date   ??? Back pain    ??? Breast cancer ([place]) 2008 or 2009   ??? Diabetes ([place])     ??? FH: chemotherapy    ??? Radiation therapy complication 5621    NOT complication, just radiation     Past Surgical History:   Procedure Laterality Date   ??? BIOPSY BREAST     ??? BIOPSY FINE NEEDLE     ??? HX BACK SURGERY      herniated disc   ??? HX BREAST AUGMENTATION Right 2010   ??? HX BREAST RECONSTRUCTION Right 2011    per pt - attempted latissimus dorsi flap, then saline implant   ??? HX MASTECTOMY Right 2010    Total mastectomy per pt.     ??? HX MASTOPEXY (BREAST LIFT) Left 2011   ??? IMPLANT BREAST SILICONE/EQ Right 4989    post mastectomy   ??? MULTIPLE DELIVERY C-SECTION      x2   ??? THUMB SUPPORT      thumb surgery to mobilize thumb     Social History     Social History   ??? Marital status: MARRIED     Spouse name: N/A   ??? Number of children: N/A   ??? Years of education: N/A     Social History Main Topics   ??? Smoking status: Current Every Day Smoker     Packs/day: 1.00   ??? Smokeless tobacco: Never Used   ??? Alcohol use No   ??? Drug use:  No   ??? Sexual activity: Not Asked     Other Topics Concern   ??? None     Social History Narrative     REVIEW OF SYSTEMS     Constitutional: No fever, weight loss, fatigue or recent chills.     Skin:  No recent rashes, dermatitis or abnormal moles.     HEENT:  No changes in vision, vertigo, epistaxis, dysphasia, or hoarseness.     Cardiac:  No chest pain, palpitations, or edema.      Respiratory: No chronic cough, shortness of breath, wheezing, hemoptysis, or history of sleep apnea.  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 No history of GERD.     Genitourinary:  No history of hematuria, dysuria, frequency, or stress urinary incontinence. No nocturia.     Musculoskeletal: No weakness, joint pains, or arthritis.  Patient does  have a history of chronic back pain     Endocrine:  No history of thyroid disease.  History of insulin-dependent diabetes with neuropathy.  Patient's last hemoglobin A1c was over 8     Lymph/hemo:  No history of blood transfusions or easy bruising. No anemia.  Patient states that she had "blood clots" with previous surgeries but she pointed to her chest     Neuro: No dizziness or headaches or fainting.  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    PHYSICAL EXAM    Visit Vitals   ??? BP 138/64 (BP 1 Location: Left arm, BP Patient Position: Sitting)   ??? Pulse 87   ??? Temp 97.9 ??F (36.6 ??C) (Oral)   ??? Resp 16   ??? Ht 5' 2"  (1.575 m)   ??? Wt 64.2 kg (141 lb 9.6 oz)   ??? SpO2 96%   ??? BMI 25.9 kg/m2          Constitutional:  Well-developed, well-nourished, no acute distress.  Patient understandably very anxious today and upset about the new diagnosis of breast cancer     Head:  Head, eyes, ears, nose, throat within normal limits.     Skin:  No suspicious moles or rashes.     Neck:  No masses or adenopathy. The airway appears normal. Thyroid is not enlarged and there are no palpable thyroid nodules.     Lungs:  Lungs are clear to auscultation and percussion. No respiratory distress. No chest wall tenderness.  Slightly coarse breath sounds     Heart:  Heart is regular with no extra heart sounds or murmur heard.     Breast Exam: On the patient's right breast where she has had a mastectomy and reconstruction and then subsequently implant she has scars.  Implant itself is very tense with perhaps a fibrous capsule.  No discrete masses are noted.  Axilla is negative.  The left breast the patient has had a reduction mammoplasty with scars she has a small area of bruising from the recent core biopsy and possibly some swelling around this but no significant discrete mass otherwise.  And no axillary adenopathy.     Abdomen:  The abdomen is soft and nontender without organomegaly or  masses. Bowel sounds are active and of normal pitch. There is no abdominal distention. No hernias  are evident.     Extremities:  No tenderness of the extremities and no significant swelling.     Psych:  Alert and oriented.    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  HER-2 is pending.  #2 history in 2008 of a right breast cancer that apparently was large.  It was treated with neoadjuvant chemotherapy and subsequent mastectomy and through couple of serious reconstruction she now has an implant.  She also received radiation therapy on that previous breast/mastectomy site.  #2 diabetes with neuropathy and relatively poor control with IMA globin A1c of about 8 recently.  #3 chronic back pain #4 long-term smoker probable mild COPD #5 history of "blood clots"???we will have to try to further clarify this with the patient.    Recommendations.  As noted above Dr. Jennye Boroughs and I had a long discussion with the patient all the options were discussed.  Risks and complications were discussed.  We did cover some discussion regarding chemotherapy and radiation therapy and hormonal therapy.  The patient does want see a plastic surgeon to make a decision as to whether or not she wants just a mastectomy or mastectomy with potential expanders and whether or not she wants anything further done on the right reconstructed breast.  The patient also had a previous BRCA test 10 years ago and we would like to do a repeat panel test at this time on the patient.    The above was dictated with Dragon.  There may be unrecognized errors.    Marlan Palau MD

## 2017-05-22 NOTE — Progress Notes (Signed)
Chief Complaint   Patient presents with   ??? Breast Mass     Surgical evaluation of Left breast mass.  Last mammo 7.31.18. Denies breast pai     Last mammogram: 7.31.18    Breast pain? no    Do you do self breast exams? yes    Do you feel any abnormal areas on your breast(s)? no    Do you have any nipple discharge/drainage? Color? no    Any discoloration of the skin on/around breast? no    Consume caffeine?             yes             How much? 4 cups 1 soda a day    Menarche age: 51yo    When was your last menstrual cycle?  2/ 2018    Start of menopause? 11/2016    Birth control:              no           How long?                                            Type:    Gravida:          2               Para:           2                 Abortions:    Age of first pregnancy: 51yo    Breast feed?      no                   Months total:    Fam hx of breast ca?        no                Relation:                              Age dx:     Fam: hx of ovarian ca?       no              Relation:                              Age dx:    Pt hx or breast or ovarian ca?          yes               Age of dx? Dx 10years ago. 05/2017

## 2017-05-22 NOTE — Patient Instructions (Signed)
If you have any questions or concerns about today's appointment, the verbal and/or written instructions you were given for follow up care, please call our office at [phone].    [facility] [place]  [address], [place] [address]    [phone] office  [phone] fax    Appointment:  Tuesday, May 27, 2017 at 5:30pm with Dr. [name] located at [address] [place] [address] [phone]    PATIENT PRE AND POST [facility] [place] [address]  [phone]    Before Surgery Instructions:   1) You must have someone available to drive you to and from your procedure and stay with you for the first 24 hours.  2) It is very important that you have nothing to eat or drink after midnight the night before your surgery. This includes chewing gum or sucking on hard candy.  Take only heart, blood pressure and cholesterol medications the morning of surgery with only a sip of water.  3) Please stop taking Plavix 10 days prior to your surgery. Stop taking Coumadin 5 days prior to your surgery. Stop taking all Aspirin or Aspirin containing products 7 days prior to your surgery. Stop taking Advil, Motrin, Aleve, and etc. 3 days prior to your surgery.  4) If you take any diabetic medications please consult with your primary care physician on how to take them on the day of your surgery  5) Please stop all Herbal products 2 weeks prior to your surgery.  6) Please arrive at the hospital 2 hours prior to your surgery, unless you have been otherwise instructed.  7) Patients having an operation on their colon will be given a separate instruction sheet on their Bowel Prep.  8) For any pre-operative work up check in at the main entrance to [facility], and then go to Patient Registration. These studies are done on a walk in basis they are open from 7:00am to 5:00pm  Monday through Friday.  9) Please wash your surgical site the morning of your surgery with soap and water.  10) If you are of child bearing age you will have pregnancy test done the morning of your surgery as soon as you arrive.  11) You may be contacted to change your surgery time. At times this is necessary due to equipment or staffing needs.    After Surgery Instructions:   You will need to be seen in the office for a follow-up visit 7-14 days after your surgery. Please call after you have had the procedure to make this appointment.  Unless otherwise instructed, you may remove your outer bandage and shower 48 hours after your surgery.  If you develop a fever greater than 101, have any significant drainage, bleeding, swelling and/or pus of the wound. Please call our office immediately.    Surgery Date and Time:                                                                  Please check in at [facility], enter through the Emergency Room entrance and go up to the second floor. Please check  in by          the day of your surgery.     You may contact Rollene Fare with any questions at [phone].

## 2017-05-22 NOTE — Progress Notes (Signed)
1. Have you been to the ER, urgent care clinic since your last visit?  Hospitalized since your last visit?No    2. Have you seen or consulted any other health care providers outside of the [facility] since your last visit?  Include any pap smears or colon screening. No

## 2017-05-22 NOTE — Telephone Encounter (Signed)
Barbara [name] from [facility] stated that pt. Needs a referral to see Dr. Delman Cheadle, plastic surgeon to assist with surgery. Pt. is having a consultation to see if she want reconstruction done after mastectomy. Please assist.

## 2017-05-22 NOTE — Communication Body (Signed)
Dear Dr.Deshmukh,    Barbara [name] came to the office today for evaluation regarding her newly diagnosed left breast cancer found on a recent abnormal mammogram.  As you know in 2008 she underwent a right mastectomy after neoadjuvant chemotherapy for a breast cancer.  She also received radiation therapy.  At that time she was 51 years old and apparently her BRCA test was negative.  She did undergo some reconstruction but apparently had a failure of the initial procedure and ultimately has an implant.    Now she has a new breast cancer in the left breast which is invasive ductal/mixed lobular and is estrogen and progesterone receptor positive.  HER-2 is pending.    On examination today we do not feel any discrete mass in the left breast but she has some slight bruising and tenderness from the recent core biopsy.  Her axilla is negative.    I along with Dr. Jennye Boroughs, our new breast surgeon had a long discussion with the patient regarding options for treatment.  At this time she seems to be leaning in the direction of a left mastectomy but wants to talk to a plastic surgeon about any reconstruction.  We will arrange that and then have further discussions with her about the ultimate treatment.    Thank you very much for allowing [place] to see her.  We have encouraged her to stop smoking and to continue to try to keep her diabetes under as good control as possible.    Thank you for your kind referral.    With regards,    Marlan Palau MD

## 2017-05-22 NOTE — Telephone Encounter (Signed)
Please advise.

## 2017-05-22 NOTE — Progress Notes (Signed)
Barbara [name] is a 51 y.o.  female and presents with     Chief Complaint   Patient presents with   ??? Hypertension   ??? Diabetes   ??? Breast Cancer   ??? Nicotine Dependence   ??? Anxiety       Pt is distraught that she has breast cancer in her left breast. She had breast cancer rt  Side 10 years back.  Pt saw surgery and plan is to do lumpectomy versus mastectomy and breast reconstruction.  Pt feels anxious and is emotional and crying.Sugars have improved but they vary in the morning from 110 to 160.  Pt does smoke and is willing to quit smoking.  Pt saw hand surgeon for Dupuytrens cont and was told she could have RA      Past Medical History:   Diagnosis Date   ??? Back pain    ??? Breast cancer ([place]) 2008 or 2009   ??? Diabetes ([place])    ??? FH: chemotherapy    ??? Radiation therapy complication 3976    NOT complication, just radiation     Past Surgical History:   Procedure Laterality Date   ??? BIOPSY BREAST     ??? BIOPSY FINE NEEDLE     ??? HX BACK SURGERY      herniated disc   ??? HX BREAST AUGMENTATION Right 2010   ??? HX BREAST RECONSTRUCTION Right 2011    per pt - attempted latissimus dorsi flap, then saline implant   ??? HX MASTECTOMY Right 2010    Total mastectomy per pt.     ??? HX MASTOPEXY (BREAST LIFT) Left 2011   ??? IMPLANT BREAST SILICONE/EQ Right 7341    post mastectomy   ??? MULTIPLE DELIVERY C-SECTION      x2   ??? THUMB SUPPORT      thumb surgery to mobilize thumb     Current Outpatient Prescriptions   Medication Sig   ??? LORazepam (ATIVAN) 0.5 mg tablet Take 1 Tab by mouth every eight (8) hours as needed for Anxiety. Max Daily Amount: 1.5 mg.   ??? insulin NPH/insulin regular (NOVOLIN 70/30, HUMULIN 70/30) 100 unit/mL (70-30) injection Administer 25 units subcut twice daily 30 minutes before breakfast and dinner   ??? gabapentin (NEURONTIN) 300 mg capsule Take 1 Cap by mouth two (2) times a day.   ??? Back Brace misc Chronic back pain   ??? Insulin Needles, Disposable, 31 gauge x 5/16" ndle DM    ??? ramipril (ALTACE) 10 mg capsule Take 1 Cap by mouth daily.   ??? Insulin Syringes, Disposable, 1 mL syrg Diagnosis 250.00   ??? clotrimazole (LOTRIMIN) 1 % topical cream Apply  to affected area two (2) times a day.   ??? Lancets misc DM   ??? glucose blood VI test strips (FREESTYLE LITE STRIPS) strip Monitor blood sugars twice   ??? cyclobenzaprine (FLEXERIL) 10 mg tablet Take 1 Tab by mouth three (3) times daily as needed for Muscle Spasm(s).   ??? lidocaine (LIDODERM) 5 % Apply patch to the affected area for 12 hours a day and remove for 12 hours a day.   ??? cyclobenzaprine (FLEXERIL) 10 mg tablet Take 1 Tab by mouth two (2) times daily as needed for Muscle Spasm(s).     No current facility-administered medications for this visit.      Health Maintenance   Topic Date Due   ??? FOOT EXAM Q1  11/27/1977   ??? DTaP/Tdap/Td series (1 - Tdap) 11/27/1988   ???  PAP AKA CERVICAL CYTOLOGY  11/27/1988   ??? Influenza Age 66 to Adult  05/14/2017   ??? HEMOGLOBIN A1C Q6M  11/15/2017   ??? MICROALBUMIN Q1  01/29/2018   ??? Pneumococcal 19-64 Highest Risk (2 of 3 - PCV13) 03/26/2018   ??? EYE EXAM RETINAL OR DILATED Q1  03/26/2018   ??? LIPID PANEL Q1  05/15/2018     Immunization History   Administered Date(s) Administered   ??? Pneumococcal Polysaccharide (PPSV-23) 03/26/2017     No LMP recorded. Patient is not currently having periods (Reason: Premenopausal).        Allergies and Intolerances:   Allergies   Allergen Reactions   ??? Tramadol Anaphylaxis       Family History:   Family History   Problem Relation Age of Onset   ??? Diabetes Mother    ??? No Known Problems Father        Social History:   She  reports that she has been smoking.  She has been smoking about 1.00 pack per day. She has never used smokeless tobacco.  She  reports that she does not drink alcohol.            Review of Systems: pos for anxiety  General: negative for - chills, fatigue, fever, weight change  Psych: negative for - anxiety, depression, irritability or mood swings   ENT: negative for - headaches, hearing change, nasal congestion, oral lesions, sneezing or sore throat  Heme/ Lymph: negative for - bleeding problems, bruising, pallor or swollen lymph nodes  Endo: negative for - hot flashes, polydipsia/polyuria or temperature intolerance  Resp: negative for - cough, shortness of breath or wheezing  CV: negative for - chest pain, edema or palpitations  GI: negative for - abdominal pain, change in bowel habits, constipation, diarrhea or nausea/vomiting  GU: negative for - dysuria, hematuria, incontinence, pelvic pain or vulvar/vaginal symptoms  MSK: negative for - joint pain, joint swelling or muscle pain  Neuro: negative for - confusion, headaches, seizures or weakness  Derm: negative for - dry skin, hair changes, rash or skin lesion changes          Physical:   Vitals:   Vitals:    05/22/17 1511   BP: 125/82   Pulse: 89   Resp: 18   Temp: 98.2 ??F (36.8 ??C)   TempSrc: Oral   SpO2: 96%   Weight: 140 lb (63.5 kg)   Height: 5\' 2"  (1.575 m)           Exam:   HEENT- atraumatic,normocephalic, awake, oriented, well nourished,anxious , emotional  Neck - supple,no enlarged lymph nodes, no JVD, no thyromegaly  Chest- CTA, no rhonchi, no crackles  Heart- rrr, no murmurs / gallop/rub, soft SEM 2/6 in mitral area  Abdomen- soft,BS+,NT, no hepatosplenomegaly  Ext - no c/c/edema , pos for Dupuytrens contr  Neuro- no focal deficits.Power 5/5 all extremities  Skin - warm,dry, no obvious rashes.          Review of Data:   LABS:   Lab Results   Component Value Date/Time    WBC 11.2 (H) 05/15/2017 08:32 AM    HGB 15.6 05/15/2017 08:32 AM    HCT 46.3 05/15/2017 08:32 AM    PLATELET 256 05/15/2017 08:32 AM     Lab Results   Component Value Date/Time    Sodium 139 05/15/2017 08:32 AM    Potassium 3.9 05/15/2017 08:32 AM    Chloride 98 05/15/2017 08:32 AM    CO2  25 05/15/2017 08:32 AM    Glucose 241 (H) 05/15/2017 08:32 AM    BUN 12 05/15/2017 08:32 AM    Creatinine 0.4 (L) 05/15/2017 08:32 AM      Lab Results   Component Value Date/Time    Cholesterol, total 178 05/15/2017 08:32 AM    HDL Cholesterol 42 05/15/2017 08:32 AM    LDL, calculated 106 (H) 05/15/2017 08:32 AM    Triglyceride 149 05/15/2017 08:32 AM     No results found for: GPT        Impression / Plan:        ICD-10-CM ICD-9-CM    1. 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 Anxiety F41.9 300.00 LORazepam (ATIVAN) 0.5 mg tablet   3. 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 Heart murmur R01.1 785.2 ECHO ADULT COMPLETE   5. Smoker F17.200 305.1    6. Arthralgia, unspecified joint W10.93 235.57 CYCLIC CITRUL PEPTIDE AB, IGG   7. Dupuytren contracture M72.0 728.6          Explained to patient risk benefits of the medications.Advised patient to stop meds if having any side effects.Pt verbalized understanding of the instructions.    I have discussed the diagnosis with the patient and the intended plan as seen in the above orders.  The patient has received an after-visit summary and questions were answered concerning future plans.  I have discussed medication side effects and warnings with the patient as well. I have reviewed the plan of care with the patient, accepted their input and they are in agreement with the treatment goals.     Reviewed plan of care. Patient has provided input and agrees with goals.    Follow-up Disposition:  Return in about 2 months (around 07/22/2017).    Elnora Morrison, MD

## 2017-05-22 NOTE — H&P (View-Only) (Signed)
History of present illness    Barbara [name] comes to the office today for evaluation regarding a new diagnosis of a left breast cancer.  The patient had a recent left breast mammogram that showed an area of calcifications and possibly small mass in about the 1130 position 3 cm from the nipple measuring about 1.8 cm.  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  The tumor is estrogen and progesterone receptor positive but HER-2 is pending.    Of significance is the fact that back in 2008 the patient developed right breast cancer which apparently was fairly large.  She believes she had gene testing at that time when she was 51 years old.  She underwent a mastectomy and apparently had neoadjuvant chemotherapy.  Subsequently she had radiation therapy.  She apparently had some latissimus dorsi flap reconstruction but this failed and then subsequent to that she had an implant.  Subsequent to that she had a reduction mammoplasty of the left breast.  She has not been aware of any recurrent disease since her first cancer until the development of this new cancer in the other breast.    The patient is unhappy with the implant results on her right breast.  She seems to be debating whether or not she just wants to have a mastectomy on the left side and removal of the implant on her right side and then just go with prostheses.  Her other option would be to have expanders and ultimately an implant placed on the left side if we do a mastectomy and then revision of the right side.  Another option that came up during this discussion was having a lumpectomy with radiation therapy on the left side but the patient seems to be leaning in the direction of a mastectomy.    Allergies   Allergen Reactions   ??? Tramadol Anaphylaxis     Past Medical History:   Diagnosis Date   ??? Back pain    ??? Breast cancer ([place]) 2008 or 2009   ??? Diabetes ([place])     ??? FH: chemotherapy    ??? Radiation therapy complication 5366    NOT complication, just radiation     Past Surgical History:   Procedure Laterality Date   ??? BIOPSY BREAST     ??? BIOPSY FINE NEEDLE     ??? HX BACK SURGERY      herniated disc   ??? HX BREAST AUGMENTATION Right 2010   ??? HX BREAST RECONSTRUCTION Right 2011    per pt - attempted latissimus dorsi flap, then saline implant   ??? HX MASTECTOMY Right 2010    Total mastectomy per pt.     ??? HX MASTOPEXY (BREAST LIFT) Left 2011   ??? IMPLANT BREAST SILICONE/EQ Right 4403    post mastectomy   ??? MULTIPLE DELIVERY C-SECTION      x2   ??? THUMB SUPPORT      thumb surgery to mobilize thumb     Social History     Social History   ??? Marital status: MARRIED     Spouse name: N/A   ??? Number of children: N/A   ??? Years of education: N/A     Social History Main Topics   ??? Smoking status: Current Every Day Smoker     Packs/day: 1.00   ??? Smokeless tobacco: Never Used   ??? Alcohol use No   ??? Drug use:  No   ??? Sexual activity: Not Asked     Other Topics Concern   ??? None     Social History Narrative     REVIEW OF SYSTEMS     Constitutional: No fever, weight loss, fatigue or recent chills.     Skin:  No recent rashes, dermatitis or abnormal moles.     HEENT:  No changes in vision, vertigo, epistaxis, dysphasia, or hoarseness.     Cardiac:  No chest pain, palpitations, or edema.      Respiratory: No chronic cough, shortness of breath, wheezing, hemoptysis, or history of sleep apnea.  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 No history of GERD.     Genitourinary:  No history of hematuria, dysuria, frequency, or stress urinary incontinence. No nocturia.     Musculoskeletal: No weakness, joint pains, or arthritis.  Patient does  have a history of chronic back pain     Endocrine:  No history of thyroid disease.  History of insulin-dependent diabetes with neuropathy.  Patient's last hemoglobin A1c was over 8     Lymph/hemo:  No history of blood transfusions or easy bruising. No anemia.  Patient states that she had "blood clots" with previous surgeries but she pointed to her chest     Neuro: No dizziness or headaches or fainting.  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    PHYSICAL EXAM    Visit Vitals   ??? BP 138/64 (BP 1 Location: Left arm, BP Patient Position: Sitting)   ??? Pulse 87   ??? Temp 97.9 ??F (36.6 ??C) (Oral)   ??? Resp 16   ??? Ht 5' 2"  (1.575 m)   ??? Wt 64.2 kg (141 lb 9.6 oz)   ??? SpO2 96%   ??? BMI 25.9 kg/m2          Constitutional:  Well-developed, well-nourished, no acute distress.  Patient understandably very anxious today and upset about the new diagnosis of breast cancer     Head:  Head, eyes, ears, nose, throat within normal limits.     Skin:  No suspicious moles or rashes.     Neck:  No masses or adenopathy. The airway appears normal. Thyroid is not enlarged and there are no palpable thyroid nodules.     Lungs:  Lungs are clear to auscultation and percussion. No respiratory distress. No chest wall tenderness.  Slightly coarse breath sounds     Heart:  Heart is regular with no extra heart sounds or murmur heard.     Breast Exam: On the patient's right breast where she has had a mastectomy and reconstruction and then subsequently implant she has scars.  Implant itself is very tense with perhaps a fibrous capsule.  No discrete masses are noted.  Axilla is negative.  The left breast the patient has had a reduction mammoplasty with scars she has a small area of bruising from the recent core biopsy and possibly some swelling around this but no significant discrete mass otherwise.  And no axillary adenopathy.     Abdomen:  The abdomen is soft and nontender without organomegaly or  masses. Bowel sounds are active and of normal pitch. There is no abdominal distention. No hernias  are evident.     Extremities:  No tenderness of the extremities and no significant swelling.     Psych:  Alert and oriented.    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  HER-2 is pending.  #2 history in 2008 of a right breast cancer that apparently was large.  It was treated with neoadjuvant chemotherapy and subsequent mastectomy and through couple of serious reconstruction she now has an implant.  She also received radiation therapy on that previous breast/mastectomy site.  #2 diabetes with neuropathy and relatively poor control with IMA globin A1c of about 8 recently.  #3 chronic back pain #4 long-term smoker probable mild COPD #5 history of "blood clots"???we will have to try to further clarify this with the patient.    Recommendations.  As noted above Dr. Jennye Boroughs and I had a long discussion with the patient all the options were discussed.  Risks and complications were discussed.  We did cover some discussion regarding chemotherapy and radiation therapy and hormonal therapy.  The patient does want see a plastic surgeon to make a decision as to whether or not she wants just a mastectomy or mastectomy with potential expanders and whether or not she wants anything further done on the right reconstructed breast.  The patient also had a previous BRCA test 10 years ago and we would like to do a repeat panel test at this time on the patient.    The above was dictated with Dragon.  There may be unrecognized errors.    Marlan Palau MD

## 2017-05-23 NOTE — Telephone Encounter (Signed)
Pt referred to Dr. [name]  [facility]  t [phone]  f [phone]

## 2017-05-24 LAB — CYCLIC CITRUL PEPTIDE AB, IGG: CCP Antibodies IgG/IgA: 10 units (ref 0–19)

## 2017-05-26 ENCOUNTER — Encounter: Attending: Internal Medicine | Primary: Nurse Practitioner

## 2017-05-26 ENCOUNTER — Inpatient Hospital Stay: Admit: 2017-05-26 | Payer: PRIVATE HEALTH INSURANCE | Primary: Nurse Practitioner

## 2017-05-26 DIAGNOSIS — Z01818 Encounter for other preprocedural examination: Secondary | ICD-10-CM

## 2017-05-26 DIAGNOSIS — Z170 Estrogen receptor positive status [ER+]: Secondary | ICD-10-CM

## 2017-05-26 LAB — METABOLIC PANEL, COMPREHENSIVE
A-G Ratio: 0.9 (ref 0.8–1.7)
ALT (SGPT): 27 U/L (ref 13–56)
AST (SGOT): 17 U/L (ref 15–37)
Albumin: 3.7 g/dL (ref 3.4–5.0)
Alk. phosphatase: 167 U/L — ABNORMAL HIGH (ref 45–117)
Anion gap: 5 mmol/L (ref 3.0–18)
BUN/Creatinine ratio: 15 (ref 12–20)
BUN: 7 MG/DL (ref 7.0–18)
Bilirubin, total: 0.3 MG/DL (ref 0.2–1.0)
CO2: 28 mmol/L (ref 21–32)
Calcium: 9 MG/DL (ref 8.5–10.1)
Chloride: 108 mmol/L (ref 100–108)
Creatinine: 0.48 MG/DL — ABNORMAL LOW (ref 0.6–1.3)
GFR est AA: >60 mL/min/{1.73_m2} (ref >60)
GFR est non-AA: >60 mL/min/{1.73_m2} (ref >60)
Globulin: 4.3 g/dL — ABNORMAL HIGH (ref 2.0–4.0)
Glucose: 135 mg/dL — ABNORMAL HIGH (ref 74–99)
Potassium: 4.7 mmol/L (ref 3.5–5.5)
Protein, total: 8 g/dL (ref 6.4–8.2)
Sodium: 141 mmol/L (ref 136–145)

## 2017-05-27 LAB — EKG, 12 LEAD, INITIAL
Atrial Rate: 77 {beats}/min
Calculated P Axis: 59 degrees
Calculated R Axis: 48 degrees
Calculated T Axis: 45 degrees
Diagnosis: NORMAL
P-R Interval: 158 ms
Q-T Interval: 402 ms
QRS Duration: 96 ms
QTC Calculation (Bezet): 454 ms
Ventricular Rate: 77 {beats}/min

## 2017-05-27 LAB — EKG 12-LEAD
Atrial Rate: 77 {beats}/min
Diagnosis: NORMAL
P Axis: 59 degrees
P-R Interval: 158 ms
Q-T Interval: 402 ms
QRS Duration: 96 ms
QTc Calculation (Bazett): 454 ms
R Axis: 48 degrees
T Axis: 45 degrees
Ventricular Rate: 77 {beats}/min

## 2017-05-29 ENCOUNTER — Inpatient Hospital Stay: Admit: 2017-05-29 | Payer: PRIVATE HEALTH INSURANCE | Attending: Internal Medicine | Primary: Nurse Practitioner

## 2017-05-29 DIAGNOSIS — R011 Cardiac murmur, unspecified: Secondary | ICD-10-CM

## 2017-05-30 LAB — ECHO ADULT COMPLETE
AV Peak Gradient: 0 mmHg
AV Peak Velocity: 0 cm/s
EF BP: 63.1 % (ref 55–100)
Est. RA Pressure: 5 mmHg
IVSd: 0.76 cm (ref 0.6–0.9)
LA Volume 2C: 74.53 mL — AB (ref 22–52)
LA Volume 4C: 52.28 mL — AB (ref 22–52)
LA Volume Index 2C: 45.37 ml/m2
LA Volume Index 4C: 31.83 ml/m2
LV EDV A2C: 96.1 mL
LV EDV A4C: 81.8 mL
LV EDV BP: 90.8 ml (ref 56–104)
LV EDV Index A2C: 58.5 mL/m2
LV EDV Index A4C: 49.8 mL/m2
LV EDV Index BP: 55.3 mL/m2
LV ESV A2C: 34.1 mL
LV ESV A4C: 31 mL
LV ESV BP: 33.5 mL (ref 19–49)
LV ESV Index A2C: 20.8 mL/m2
LV ESV Index A4C: 18.9 mL/m2
LV ESV Index BP: 20.4 mL/m2
LV Ejection Fraction A2C: 65 %
LV Ejection Fraction A4C: 62 %
LV IVRT: 49.9 ms
LV Mass 2D Index: 79.6 g/m2
LV Mass 2D: 130.8 g (ref 67–162)
LVIDd: 4.51 cm (ref 3.9–5.3)
LVIDs: 3.38 cm
LVOT Diameter: 1.9 cm
LVOT Peak Gradient: 3.3 mmHg
LVOT Peak Velocity: 90.66 cm/s
LVPWd: 0.87 cm (ref 0.6–0.9)
MV A Velocity: 70.36 cm/s
MV Area by PHT: 10.5 cm2
MV E Velocity: 0.78 cm/s
MV E Wave Deceleration Time: 72.2 ms
MV E/A: 0.01
MV PHT: 20.9 ms
PASP: 7.4 mmHg
Pulm Vein A Duration: 77.6 ms
Pulm Vein A Velocity: 19.08 cm/s
RVSP: 7.4 mmHg
TR Max Velocity: -77.5 cm/s
TR Peak Gradient: 2.4 mmHg

## 2017-05-30 LAB — TRANSTHORACIC ECHOCARDIOGRAM (TTE) COMPLETE (CONTRAST/BUBBLE/3D PRN)
AV Peak Gradient: 0 mmHg
AV Peak Velocity: 0 cm/s
EF BP: 63.1 % (ref 55–100)
Est. RA Pressure: 5 mmHg
IVSd: 0.76 cm (ref 0.6–0.9)
LA Volume 2C: 74.53 mL — AB (ref 22–52)
LA Volume 4C: 52.28 mL — AB (ref 22–52)
LA Volume Index 2C: 45.37 ml/m2
LA Volume Index 4C: 31.83 ml/m2
LV EDV A2C: 96.1 mL
LV EDV A4C: 81.8 mL
LV EDV BP: 90.8 ml (ref 56–104)
LV EDV Index A2C: 58.5 mL/m2
LV EDV Index A4C: 49.8 mL/m2
LV EDV Index BP: 55.3 mL/m2
LV ESV A2C: 34.1 mL
LV ESV A4C: 31 mL
LV ESV BP: 33.5 mL (ref 19–49)
LV ESV Index A2C: 20.8 mL/m2
LV ESV Index A4C: 18.9 mL/m2
LV ESV Index BP: 20.4 mL/m2
LV Ejection Fraction A2C: 65 %
LV Ejection Fraction A4C: 62 %
LV IVRT: 49.9 ms
LV Mass 2D Index: 79.6 g/m2
LV Mass 2D: 130.8 g (ref 67–162)
LVIDd: 4.51 cm (ref 3.9–5.3)
LVIDs: 3.38 cm
LVOT Diameter: 1.9 cm
LVOT Peak Gradient: 3.3 mmHg
LVOT Peak Velocity: 90.66 cm/s
LVPWd: 0.87 cm (ref 0.6–0.9)
Left Ventricular Ejection Fraction: 58
MV A Velocity: 70.36 cm/s
MV Area by PHT: 10.5 cm2
MV E Velocity: 0.78 cm/s
MV E Wave Deceleration Time: 72.2 ms
MV E/A: 0.01
MV PHT: 20.9 ms
PASP: 7.4 mmHg
Pulm Vein A Duration: 77.6 ms
Pulm Vein A Velocity: 19.08 cm/s
RVSP: 7.4 mmHg
TR Max Velocity: -77.5 cm/s
TR Peak Gradient: 2.4 mmHg

## 2017-06-02 ENCOUNTER — Inpatient Hospital Stay: Admit: 2017-06-05 | Payer: PRIVATE HEALTH INSURANCE | Primary: Nurse Practitioner

## 2017-06-02 ENCOUNTER — Ambulatory Visit
Admit: 2017-06-02 | Discharge: 2017-06-02 | Payer: PRIVATE HEALTH INSURANCE | Attending: Internal Medicine | Primary: Nurse Practitioner

## 2017-06-02 DIAGNOSIS — H6691 Otitis media, unspecified, right ear: Secondary | ICD-10-CM

## 2017-06-02 MED ORDER — AMOXICILLIN 500 MG CAP
500 mg | ORAL_CAPSULE | Freq: Three times a day (TID) | ORAL | 0 refills | Status: DC
Start: 2017-06-02 — End: 2017-06-11

## 2017-06-02 MED ORDER — CEFTRIAXONE 1 GRAM SOLUTION FOR INJECTION
1 gram | Freq: Once | INTRAMUSCULAR | 0 refills | Status: AC
Start: 2017-06-02 — End: 2017-06-02

## 2017-06-02 MED ORDER — CIPROFLOXACIN-DEXAMETHASONE 0.3 %-0.1 % EAR DROPS, SUSP
Freq: Two times a day (BID) | OTIC | 1 refills | Status: AC
Start: 2017-06-02 — End: 2017-06-05

## 2017-06-02 NOTE — Progress Notes (Signed)
Barbara [name] is a 51 y.o.  female and presents with     Chief Complaint   Patient presents with   ??? Ear Pain   ??? Dizziness   ??? Vaginal Bleeding       Pt has been having rt ear ache for 2 days.  Pt also has mild dizziness.  She has throat congestion  No fever or chills.  Has mild pain in front of the rt ear pinna.  No cough.  No headaches.  Pt stopped having menses 6 months back and now has vaginal bleeding again.      Past Medical History:   Diagnosis Date   ??? Back pain    ??? Breast cancer ([place]) 2008 or 2009   ??? Diabetes ([place])    ??? FH: chemotherapy    ??? Radiation therapy complication 7062    NOT complication, just radiation     Past Surgical History:   Procedure Laterality Date   ??? BIOPSY BREAST     ??? BIOPSY FINE NEEDLE     ??? HX BACK SURGERY      herniated disc   ??? HX BREAST AUGMENTATION Right 2010   ??? HX BREAST RECONSTRUCTION Right 2011    per pt - attempted latissimus dorsi flap, then saline implant   ??? HX MASTECTOMY Right 2010    Total mastectomy per pt.     ??? HX MASTOPEXY (BREAST LIFT) Left 2011   ??? IMPLANT BREAST SILICONE/EQ Right 3762    post mastectomy   ??? MULTIPLE DELIVERY C-SECTION      x2   ??? THUMB SUPPORT      thumb surgery to mobilize thumb     Current Outpatient Prescriptions   Medication Sig   ??? cefTRIAXone (ROCEPHIN) 1 gram injection 1 g by IntraMUSCular route once for 1 dose.   ??? amoxicillin (AMOXIL) 500 mg capsule Take 1 Cap by mouth three (3) times daily for 10 days.   ??? ciprofloxacin-dexamethasone (CIPRODEX) 0.3-0.1 % otic suspension Administer 4 Drops in right ear two (2) times a day for 3 days.   ??? LORazepam (ATIVAN) 0.5 mg tablet Take 1 Tab by mouth every eight (8) hours as needed for Anxiety. Max Daily Amount: 1.5 mg.   ??? insulin NPH/insulin regular (NOVOLIN 70/30, HUMULIN 70/30) 100 unit/mL (70-30) injection Administer 25 units subcut twice daily 30 minutes before breakfast and dinner   ??? gabapentin (NEURONTIN) 300 mg capsule Take 1 Cap by mouth two (2) times a day.    ??? Back Brace misc Chronic back pain   ??? Insulin Needles, Disposable, 31 gauge x 5/16" ndle DM   ??? ramipril (ALTACE) 10 mg capsule Take 1 Cap by mouth daily.   ??? Insulin Syringes, Disposable, 1 mL syrg Diagnosis 250.00   ??? clotrimazole (LOTRIMIN) 1 % topical cream Apply  to affected area two (2) times a day.   ??? Lancets misc DM   ??? glucose blood VI test strips (FREESTYLE LITE STRIPS) strip Monitor blood sugars twice   ??? cyclobenzaprine (FLEXERIL) 10 mg tablet Take 1 Tab by mouth three (3) times daily as needed for Muscle Spasm(s).   ??? lidocaine (LIDODERM) 5 % Apply patch to the affected area for 12 hours a day and remove for 12 hours a day.   ??? cyclobenzaprine (FLEXERIL) 10 mg tablet Take 1 Tab by mouth two (2) times daily as needed for Muscle Spasm(s).     No current facility-administered medications for this visit.  Health Maintenance   Topic Date Due   ??? FOOT EXAM Q1  11/27/1977   ??? DTaP/Tdap/Td series (1 - Tdap) 11/27/1988   ??? PAP AKA CERVICAL CYTOLOGY  11/27/1988   ??? Influenza Age 40 to Adult  05/14/2017   ??? HEMOGLOBIN A1C Q6M  11/15/2017   ??? MICROALBUMIN Q1  01/29/2018   ??? Pneumococcal 19-64 Highest Risk (2 of 3 - PCV13) 03/26/2018   ??? EYE EXAM RETINAL OR DILATED Q1  03/26/2018   ??? LIPID PANEL Q1  05/15/2018     Immunization History   Administered Date(s) Administered   ??? Pneumococcal Polysaccharide (PPSV-23) 03/26/2017     No LMP recorded. Patient is not currently having periods (Reason: Premenopausal).        Allergies and Intolerances:   Allergies   Allergen Reactions   ??? Tramadol Anaphylaxis       Family History:   Family History   Problem Relation Age of Onset   ??? Diabetes Mother    ??? No Known Problems Father        Social History:   She  reports that she has been smoking.  She has been smoking about 1.00 pack per day. She has never used smokeless tobacco.  She  reports that she does not drink alcohol.            Review of Systems: pos for rt ear ache and dizziness   General: negative for - chills, fatigue, fever, weight change  Psych: negative for - anxiety, depression, irritability or mood swings  ENT: negative for - headaches, hearing change, nasal congestion, oral lesions, sneezing or sore throat  Heme/ Lymph: negative for - bleeding problems, bruising, pallor or swollen lymph nodes  Endo: negative for - hot flashes, polydipsia/polyuria or temperature intolerance  Resp: negative for - cough, shortness of breath or wheezing  CV: negative for - chest pain, edema or palpitations  GI: negative for - abdominal pain, change in bowel habits, constipation, diarrhea or nausea/vomiting  GU: negative for - dysuria, hematuria, incontinence, pelvic pain or vulvar/vaginal symptoms, pos for vaginal bleeding  MSK: negative for - joint pain, joint swelling or muscle pain  Neuro: negative for - confusion, headaches, seizures or weakness  Derm: negative for - dry skin, hair changes, rash or skin lesion changes          Physical:   Vitals:   Vitals:    06/02/17 1315   BP: 126/71   Pulse: 87   Resp: 16   Temp: 98 ??F (36.7 ??C)   TempSrc: Oral   SpO2: 96%   Weight: 143 lb (64.9 kg)   Height: 5' 2"  (1.575 m)           Exam:   HEENT- atraumatic,normocephalic, awake, oriented, well nourished, rt ear otitis + , ear drum opaque , probable fluid in the middle ear.  Neck - supple,no enlarged lymph nodes, no JVD, no thyromegaly  Chest- CTA, no rhonchi, no crackles  Heart- rrr, no murmurs / gallop/rub  Abdomen- soft,BS+,NT, no hepatosplenomegaly  Ext - no c/c/edema   Neuro- no focal deficits.Power 5/5 all extremities  Skin - warm,dry, no obvious rashes.          Review of Data:   LABS:   Lab Results   Component Value Date/Time    WBC 11.2 (H) 05/15/2017 08:32 AM    HGB 15.6 05/15/2017 08:32 AM    HCT 46.3 05/15/2017 08:32 AM    PLATELET 256 05/15/2017 08:32 AM  Lab Results   Component Value Date/Time    Sodium 141 05/26/2017 11:05 AM    Potassium 4.7 05/26/2017 11:05 AM     Chloride 108 05/26/2017 11:05 AM    CO2 28 05/26/2017 11:05 AM    Glucose 135 (H) 05/26/2017 11:05 AM    BUN 7 05/26/2017 11:05 AM    Creatinine 0.48 (L) 05/26/2017 11:05 AM     Lab Results   Component Value Date/Time    Cholesterol, total 178 05/15/2017 08:32 AM    HDL Cholesterol 42 05/15/2017 08:32 AM    LDL, calculated 106 (H) 05/15/2017 08:32 AM    Triglyceride 149 05/15/2017 08:32 AM     No results found for: GPT        Impression / Plan:        ICD-10-CM ICD-9-CM    1. Otitis of right ear H66.91 382.9 cefTRIAXone (ROCEPHIN) 1 gram injection      CEFTRIAXONE SODIUM INJECTION PER 250 MG      PR THER/PROPH/DIAG INJECTION, SUBCUT/IM      amoxicillin (AMOXIL) 500 mg capsule      ciprofloxacin-dexamethasone (CIPRODEX) 0.3-0.1 % otic suspension   2. Post-menopausal bleeding N95.0 627.1 REFERRAL TO OBSTETRICS AND GYNECOLOGY   3. Elevated serum alkaline phosphatase level R74.8 790.5 ALK PHOS ISOENZYMES      MITOCHONDRIAL M2 AB      [place] ABD COMP       May need ultrasound abdomen    Explained to patient risk benefits of the medications.Advised patient to stop meds if having any side effects.Pt verbalized understanding of the instructions.    I have discussed the diagnosis with the patient and the intended plan as seen in the above orders.  The patient has received an after-visit summary and questions were answered concerning future plans.  I have discussed medication side effects and warnings with the patient as well. I have reviewed the plan of care with the patient, accepted their input and they are in agreement with the treatment goals.     Reviewed plan of care. Patient has provided input and agrees with goals.    Follow-up Disposition: Not on File    Elnora Morrison, MD

## 2017-06-05 LAB — ALK PHOS ISOENZYMES
ALKALINE PHOS INTESTINES: 21 % — ABNORMAL HIGH (ref 0–18)
ALKALINE PHOS LIVER: 34 % (ref 18–85)
ALKALINE PHOSPHATASE BONE: 45 % (ref 14–68)
Alk. phosphatase: 144 IU/L — ABNORMAL HIGH (ref 39–117)

## 2017-06-05 LAB — MITOCHONDRIAL M2 AB: Michochondrial (M2) Ab: 9.2 Units (ref 0.0–20.0)

## 2017-06-06 NOTE — Progress Notes (Signed)
PATIENT PRE AND POST OPERATIVE INSTRUCTIONS     [facility]   [address] [place] [address]  [phone]    Before Surgery Instructions:   1) You must have someone available to drive you to and from your procedure and stay with you for the first 24 hours.  2) It is very important that you have nothing to eat or drink after midnight the night before your surgery. This includes chewing gum or sucking on hard candy.  Take only heart, blood pressure and cholesterol medications the morning of surgery with only a sip of water.  3) Please stop taking Plavix 10 days prior to your surgery. Stop taking Coumadin 5 days prior to your surgery. Stop taking all Aspirin or Aspirin containing products 7 days prior to your surgery. Stop taking Advil, Motrin, Aleve, and etc. 3 days prior to your surgery.  4) If you take any diabetic medications please consult with your primary care physician on how to take them on the day of your surgery  5) Please stop all Herbal products 2 weeks prior to your surgery.  6) Please arrive at the hospital 2 hours prior to your surgery, unless you have been otherwise instructed.  7) Patients having an operation on their colon will be given a separate instruction sheet on their Bowel Prep.  8) For any pre-operative work up check in at the main entrance to [facility], and then go to Patient Registration. These studies are done on a walk in basis they are open from 7:00am to 5:00pm Monday through Friday.  9) Please wash your surgical site the morning of your surgery with soap and water.  10) If you are of child bearing age you will have pregnancy test done the morning of your surgery as soon as you arrive.  11) You may be contacted to change your surgery time. At times this is necessary due to equipment or staffing needs.    After Surgery Instructions:   You will need to be seen in the office for a follow-up visit 7-14 days  after your surgery. Please call after you have had the procedure to make this appointment.  Unless otherwise instructed, you may remove your outer bandage and shower 48 hours after your surgery.  If you develop a fever greater than 101, have any significant drainage, bleeding, swelling and/or pus of the wound. Please call our office immediately.    Surgery Date and Time: Tuesday, June 10, 2017 at 9:30am     Please check in at [facility], enter through the Emergency Room entrance and go up to the second floor. Please check in by 6:30am the day of your surgery.     You may contact Rollene Fare with any questions at [phone].

## 2017-06-09 ENCOUNTER — Encounter

## 2017-06-09 MED ORDER — GABAPENTIN 300 MG CAP
300 mg | ORAL_CAPSULE | Freq: Two times a day (BID) | ORAL | 0 refills | Status: DC
Start: 2017-06-09 — End: 2017-06-17

## 2017-06-09 NOTE — Other (Signed)
PAT - SURGICAL PRE-ADMISSION INSTRUCTIONS    NAME:  Barbara [name]                                                          TODAY'S DATE:  06/09/2017    SURGERY DATE:  06/10/2017                                  SURGERY ARRIVAL TIME:   0630    1. Do NOT eat or drink anything, including candy or gum, after MIDNIGHT on 06/09/17 , unless you have specific instructions from your Surgeon or Anesthesia Provider to do so.  2. No smoking on the day of surgery.  3. No alcohol 24 hours prior to the day of surgery.  4. No recreational drugs for one week prior to the day of surgery.  5. Leave [name], including money/purse, at home.  6. Remove [name], nail polish, makeup (including mascara); no lotions, powders, deodorant, or perfume/cologne/after shave.  7. Glasses/Contact lenses and Dentures may be worn to the hospital.  They will be removed prior to surgery.  8. Call your doctor if symptoms of a cold or illness develop within 24 ours prior to surgery.  9. AN ADULT MUST DRIVE YOU HOME AFTER OUTPATIENT SURGERY.   10. If you are having an OUTPATIENT procedure, please make arrangements for a responsible adult to be with you for 24 hours after your surgery.  11. If you are admitted to the hospital, you will be assigned to a bed after surgery is complete.  Normally a family member will not be able to see you until you are in your assigned bed.  54. Family is encouraged to accompany you to the hospital.  We ask visitors in the treatment area to be limited to ONE person at a time to ensure patient privacy.  EXCEPTIONS WILL BE MADE AS NEEDED.  31. Children under 12 are discouraged from entering the treatment area and need to be supervised by an adult when in the waiting room.    Special Instructions:    Take these medications the morning of surgery with a sip of water:  DO NOT TAKE INSULIN. May take Ativan.    Patient Prep:    shower with anti-bacterial soap     These surgical instructions were reviewed with [facility] during the PAT phone call.   Directions:  On the morning of surgery, please go to the [place].  Enter the building from the [employer] lot entrance, 1st floor (next to the Emergency Room entrance).  Take the elevator to the 2nd floor.  Sign in at the Registration Desk.    If you have any questions and/or concerns, please do not hesitate to call:  (Prior to the day of surgery)  [facility] unit:  [phone]  (Day of surgery)  [facility] unit:  [phone]

## 2017-06-09 NOTE — Telephone Encounter (Signed)
Requested Prescriptions     Pending Prescriptions Disp Refills   ??? gabapentin (NEURONTIN) 300 mg capsule 60 Cap 0     Sig: Take 1 Cap by mouth two (2) times a day.

## 2017-06-10 ENCOUNTER — Observation Stay

## 2017-06-10 ENCOUNTER — Inpatient Hospital Stay: Payer: PRIVATE HEALTH INSURANCE

## 2017-06-10 ENCOUNTER — Ambulatory Visit: Admit: 2017-06-10 | Payer: PRIVATE HEALTH INSURANCE | Primary: Nurse Practitioner

## 2017-06-10 LAB — GLUCOSE, POC
Glucose (POC): 133 mg/dL — ABNORMAL HIGH (ref 70–110)
Glucose (POC): 129 mg/dL — ABNORMAL HIGH (ref 70–110)
Glucose (POC): 148 mg/dL — ABNORMAL HIGH (ref 70–110)

## 2017-06-10 LAB — HCG URINE, QL. - POC: Pregnancy test,urine (POC): NEGATIVE

## 2017-06-10 MED ORDER — PROPOFOL 10 MG/ML IV EMUL
10 mg/mL | INTRAVENOUS | Status: AC
Start: 2017-06-10 — End: ?

## 2017-06-10 MED ORDER — LABETALOL 5 MG/ML IV SOLN
5 mg/mL | INTRAVENOUS | Status: DC | PRN
Start: 2017-06-10 — End: 2017-06-10
  Administered 2017-06-10: 19:00:00 via INTRAVENOUS

## 2017-06-10 MED ORDER — SODIUM CHLORIDE 0.9 % IJ SYRG
Freq: Three times a day (TID) | INTRAMUSCULAR | Status: DC
Start: 2017-06-10 — End: 2017-06-12

## 2017-06-10 MED ORDER — GLUCOSE 4 GRAM CHEWABLE TAB
4 gram | ORAL | Status: DC | PRN
Start: 2017-06-10 — End: 2017-06-10

## 2017-06-10 MED ORDER — RAMIPRIL 5 MG CAP
5 mg | Freq: Every day | ORAL | Status: DC
Start: 2017-06-10 — End: 2017-06-12
  Administered 2017-06-11: 13:00:00 via ORAL

## 2017-06-10 MED ORDER — LIDOCAINE (PF) 20 MG/ML (2 %) IV SYRINGE
100 mg/5 mL (2 %) | INTRAVENOUS | Status: AC
Start: 2017-06-10 — End: ?

## 2017-06-10 MED ORDER — FAMOTIDINE 20 MG TAB
20 mg | ORAL | Status: AC
Start: 2017-06-10 — End: 2017-06-10

## 2017-06-10 MED ORDER — SODIUM CHLORIDE 0.9 % IJ SYRG
INTRAMUSCULAR | Status: DC | PRN
Start: 2017-06-10 — End: 2017-06-12

## 2017-06-10 MED ORDER — ISOSULFAN BLUE 1 % IJ SOLN
1 % | SUBCUTANEOUS | Status: DC | PRN
Start: 2017-06-10 — End: 2017-06-10
  Administered 2017-06-10: 14:00:00 via SUBCUTANEOUS

## 2017-06-10 MED ORDER — INSULIN GLARGINE 100 UNIT/ML INJECTION
100 unit/mL | Freq: Every evening | SUBCUTANEOUS | Status: DC
Start: 2017-06-10 — End: 2017-06-11
  Administered 2017-06-11: 02:00:00 via SUBCUTANEOUS

## 2017-06-10 MED ORDER — MORPHINE 4 MG/ML INTRAVENOUS SOLUTION
4 mg/mL | INTRAVENOUS | Status: DC | PRN
Start: 2017-06-10 — End: 2017-06-12
  Administered 2017-06-10 – 2017-06-11 (×4): via INTRAVENOUS

## 2017-06-10 MED ORDER — DIPHENHYDRAMINE 25 MG CAP
25 mg | ORAL | Status: DC | PRN
Start: 2017-06-10 — End: 2017-06-12

## 2017-06-10 MED ORDER — ONDANSETRON (PF) 4 MG/2 ML INJECTION
4 mg/2 mL | INTRAMUSCULAR | Status: AC
Start: 2017-06-10 — End: ?

## 2017-06-10 MED ORDER — BUPIVACAINE-EPINEPHRINE (PF) 0.5 %-1:200,000 IJ SOLN
0.5 %-1:200,000 | INTRAMUSCULAR | Status: AC
Start: 2017-06-10 — End: ?

## 2017-06-10 MED ORDER — FENTANYL CITRATE (PF) 50 MCG/ML IJ SOLN
50 mcg/mL | INTRAMUSCULAR | Status: AC
Start: 2017-06-10 — End: ?

## 2017-06-10 MED ORDER — HYDROGEN PEROXIDE 3 % SOLN
3 % | Status: DC | PRN
Start: 2017-06-10 — End: 2017-06-10
  Administered 2017-06-10: 18:00:00

## 2017-06-10 MED ORDER — DEXTROSE 50% IN WATER (D50W) IV
INTRAVENOUS | Status: DC | PRN
Start: 2017-06-10 — End: 2017-06-10

## 2017-06-10 MED ORDER — TECHNETIUM TC 99M SULFUR COLLOID FILTERED
Freq: Once | Status: AC
Start: 2017-06-10 — End: 2017-06-10
  Administered 2017-06-10: 13:00:00 via SUBCUTANEOUS

## 2017-06-10 MED ORDER — LORAZEPAM 0.5 MG TAB
0.5 mg | Freq: Three times a day (TID) | ORAL | Status: DC | PRN
Start: 2017-06-10 — End: 2017-06-12

## 2017-06-10 MED ORDER — PROMETHAZINE 25 MG/ML INJECTION
25 mg/mL | Freq: Four times a day (QID) | INTRAMUSCULAR | Status: DC | PRN
Start: 2017-06-10 — End: 2017-06-10

## 2017-06-10 MED ORDER — SODIUM CHLORIDE 0.9 % IV
25 mg/mL | Freq: Four times a day (QID) | INTRAVENOUS | Status: DC | PRN
Start: 2017-06-10 — End: 2017-06-12

## 2017-06-10 MED ORDER — GLUCAGON 1 MG INJECTION
1 mg | INTRAMUSCULAR | Status: DC | PRN
Start: 2017-06-10 — End: 2017-06-10

## 2017-06-10 MED ORDER — INSULIN LISPRO 100 UNIT/ML INJECTION
100 unit/mL | Freq: Once | SUBCUTANEOUS | Status: DC
Start: 2017-06-10 — End: 2017-06-10

## 2017-06-10 MED ORDER — GABAPENTIN 300 MG CAP
300 mg | Freq: Two times a day (BID) | ORAL | Status: DC
Start: 2017-06-10 — End: 2017-06-12
  Administered 2017-06-10 – 2017-06-11 (×3): via ORAL

## 2017-06-10 MED ORDER — CEFAZOLIN 2 GM/50 ML IN DEXTROSE (ISO-OSMOTIC) IVPB
2 gram/50 mL | Freq: Once | INTRAVENOUS | Status: AC
Start: 2017-06-10 — End: 2017-06-10
  Administered 2017-06-10: 14:00:00 via INTRAVENOUS

## 2017-06-10 MED ORDER — HYDROMORPHONE 0.5 MG/0.5 ML SYRINGE
0.5 mg/ mL | Freq: Once | INTRAMUSCULAR | Status: AC
Start: 2017-06-10 — End: 2017-06-10
  Administered 2017-06-10: 18:00:00 via INTRAVENOUS

## 2017-06-10 MED ORDER — SUCCINYLCHOLINE CHLORIDE 100 MG/5 ML (20 MG/ML) IV SYRINGE
100 mg/5 mL (20 mg/mL) | INTRAVENOUS | Status: AC
Start: 2017-06-10 — End: ?

## 2017-06-10 MED ORDER — LIDOCAINE-EPINEPHRINE 0.5 %-1:200,000 IJ SOLN
0.5 %-1:200,000 | INTRAMUSCULAR | Status: AC
Start: 2017-06-10 — End: ?

## 2017-06-10 MED ORDER — SUCCINYLCHOLINE CHLORIDE 20 MG/ML INJECTION
20 mg/mL | INTRAMUSCULAR | Status: DC | PRN
Start: 2017-06-10 — End: 2017-06-10
  Administered 2017-06-10: 14:00:00 via INTRAVENOUS

## 2017-06-10 MED ORDER — FENTANYL CITRATE (PF) 50 MCG/ML IJ SOLN
50 mcg/mL | INTRAMUSCULAR | Status: DC | PRN
Start: 2017-06-10 — End: 2017-06-10
  Administered 2017-06-10: 19:00:00 via INTRAVENOUS

## 2017-06-10 MED ORDER — HYDROMORPHONE (PF) 1 MG/ML IJ SOLN
1 mg/mL | INTRAMUSCULAR | Status: DC | PRN
Start: 2017-06-10 — End: 2017-06-10
  Administered 2017-06-10: 15:00:00 via INTRAVENOUS

## 2017-06-10 MED ORDER — ISOSULFAN BLUE 1 % IJ SOLN
1 % | SUBCUTANEOUS | Status: AC
Start: 2017-06-10 — End: ?

## 2017-06-10 MED ORDER — HYDROMORPHONE (PF) 1 MG/ML IJ SOLN
1 mg/mL | INTRAMUSCULAR | Status: AC
Start: 2017-06-10 — End: ?

## 2017-06-10 MED ORDER — LIDOCAINE (PF) 20 MG/ML (2 %) IJ SOLN
20 mg/mL (2 %) | INTRAMUSCULAR | Status: AC
Start: 2017-06-10 — End: ?

## 2017-06-10 MED ORDER — ACETAMINOPHEN (TYLENOL) SOLUTION 32MG/ML
ORAL | Status: DC | PRN
Start: 2017-06-10 — End: 2017-06-12

## 2017-06-10 MED ORDER — LACTATED RINGERS IV
INTRAVENOUS | Status: DC
Start: 2017-06-10 — End: 2017-06-12
  Administered 2017-06-10 – 2017-06-11 (×5): via INTRAVENOUS

## 2017-06-10 MED ORDER — .PHARMACY TO SUBSTITUTE PER PROTOCOL
Status: DC | PRN
Start: 2017-06-10 — End: 2017-06-10

## 2017-06-10 MED ORDER — MIDAZOLAM 1 MG/ML IJ SOLN
1 mg/mL | INTRAMUSCULAR | Status: AC
Start: 2017-06-10 — End: ?

## 2017-06-10 MED ORDER — SEVOFLURANE FOR INHALATION
RESPIRATORY_TRACT | Status: AC
Start: 2017-06-10 — End: ?

## 2017-06-10 MED ORDER — NALOXONE 0.4 MG/ML INJECTION
0.4 mg/mL | INTRAMUSCULAR | Status: DC | PRN
Start: 2017-06-10 — End: 2017-06-12

## 2017-06-10 MED ORDER — FAMOTIDINE 20 MG TAB
20 mg | Freq: Once | ORAL | Status: AC
Start: 2017-06-10 — End: 2017-06-10
  Administered 2017-06-10: 11:00:00 via ORAL

## 2017-06-10 MED ORDER — INSULIN LISPRO 100 UNIT/ML INJECTION
100 unit/mL | Freq: Four times a day (QID) | SUBCUTANEOUS | Status: DC
Start: 2017-06-10 — End: 2017-06-12
  Administered 2017-06-11 (×4): via SUBCUTANEOUS

## 2017-06-10 MED ORDER — CLOTRIMAZOLE 1 % TOPICAL CREAM
1 % | Freq: Two times a day (BID) | CUTANEOUS | Status: DC
Start: 2017-06-10 — End: 2017-06-12

## 2017-06-10 MED ORDER — MIDAZOLAM 1 MG/ML IJ SOLN
1 mg/mL | INTRAMUSCULAR | Status: DC | PRN
Start: 2017-06-10 — End: 2017-06-10
  Administered 2017-06-10: 14:00:00 via INTRAVENOUS

## 2017-06-10 MED ORDER — LACTATED RINGERS IV
INTRAVENOUS | Status: DC
Start: 2017-06-10 — End: 2017-06-10
  Administered 2017-06-10: 18:00:00 via INTRAVENOUS

## 2017-06-10 MED ORDER — LABETALOL 5 MG/ML IV SYRINGE
20 mg/4 mL (5 mg/mL) | INTRAVENOUS | Status: AC
Start: 2017-06-10 — End: 2017-06-10
  Administered 2017-06-10: 19:00:00 via INTRAVENOUS

## 2017-06-10 MED ORDER — MORPHINE 4 MG/ML INTRAVENOUS SOLUTION
4 mg/mL | INTRAVENOUS | Status: AC
Start: 2017-06-10 — End: 2017-06-10
  Administered 2017-06-10: 18:00:00 via INTRAVENOUS

## 2017-06-10 MED ORDER — DIPHENHYDRAMINE HCL 50 MG/ML IJ SOLN
50 mg/mL | INTRAMUSCULAR | Status: AC
Start: 2017-06-10 — End: 2017-06-11

## 2017-06-10 MED ORDER — FENTANYL CITRATE (PF) 50 MCG/ML IJ SOLN
50 mcg/mL | INTRAMUSCULAR | Status: DC | PRN
Start: 2017-06-10 — End: 2017-06-10
  Administered 2017-06-10 (×2): via INTRAVENOUS

## 2017-06-10 MED ORDER — PROPOFOL 10 MG/ML IV EMUL
10 mg/mL | INTRAVENOUS | Status: DC | PRN
Start: 2017-06-10 — End: 2017-06-10
  Administered 2017-06-10: 14:00:00 via INTRAVENOUS

## 2017-06-10 MED ORDER — LIDOCAINE (PF) 20 MG/ML (2 %) IJ SOLN
20 mg/mL (2 %) | INTRAMUSCULAR | Status: DC | PRN
Start: 2017-06-10 — End: 2017-06-10
  Administered 2017-06-10: 14:00:00 via INTRAVENOUS

## 2017-06-10 MED FILL — CEFAZOLIN 2 GM/50 ML IN DEXTROSE (ISO-OSMOTIC) IVPB: 2 gram/50 mL | INTRAVENOUS | Qty: 50

## 2017-06-10 MED FILL — MORPHINE 4 MG/ML INTRAVENOUS SOLUTION: 4 mg/mL | INTRAVENOUS | Qty: 1

## 2017-06-10 MED FILL — HYDROMORPHONE (PF) 1 MG/ML IJ SOLN: 1 mg/mL | INTRAMUSCULAR | Qty: 1

## 2017-06-10 MED FILL — XYLOCAINE WITH EPINEPHRINE 0.5 %-1:200,000 INJECTION SOLUTION: 0.5 %-1:200,000 | INTRAMUSCULAR | Qty: 50

## 2017-06-10 MED FILL — FENTANYL CITRATE (PF) 50 MCG/ML IJ SOLN: 50 mcg/mL | INTRAMUSCULAR | Qty: 2

## 2017-06-10 MED FILL — MARCAINE-EPINEPHRINE (PF) 0.5 %-1:200,000 INJECTION SOLUTION: 0.5 %-1:200,000 | INTRAMUSCULAR | Qty: 30

## 2017-06-10 MED FILL — LIDOCAINE (PF) 20 MG/ML (2 %) IV SYRINGE: 100 mg/5 mL (2 %) | INTRAVENOUS | Qty: 20

## 2017-06-10 MED FILL — GABAPENTIN 300 MG CAP: 300 mg | ORAL | Qty: 1

## 2017-06-10 MED FILL — BD POSIFLUSH NORMAL SALINE 0.9 % INJECTION SYRINGE: INTRAMUSCULAR | Qty: 10

## 2017-06-10 MED FILL — DIPHENHYDRAMINE HCL 50 MG/ML IJ SOLN: 50 mg/mL | INTRAMUSCULAR | Qty: 1

## 2017-06-10 MED FILL — DILAUDID (PF) 0.5 MG/0.5 ML INJECTION SYRINGE: 0.5 mg/ mL | INTRAMUSCULAR | Qty: 1

## 2017-06-10 MED FILL — FAMOTIDINE 20 MG TAB: 20 mg | ORAL | Qty: 1

## 2017-06-10 MED FILL — LACTATED RINGERS IV: INTRAVENOUS | Qty: 1000

## 2017-06-10 MED FILL — LYMPHAZURIN 1 % SUBCUTANEOUS SOLUTION: 1 % | SUBCUTANEOUS | Qty: 5

## 2017-06-10 MED FILL — SUCCINYLCHOLINE CHLORIDE 100 MG/5 ML (20 MG/ML) IV SYRINGE: 100 mg/5 mL (20 mg/mL) | INTRAVENOUS | Qty: 5

## 2017-06-10 MED FILL — PROPOFOL 10 MG/ML IV EMUL: 10 mg/mL | INTRAVENOUS | Qty: 20

## 2017-06-10 MED FILL — ONDANSETRON (PF) 4 MG/2 ML INJECTION: 4 mg/2 mL | INTRAMUSCULAR | Qty: 2

## 2017-06-10 MED FILL — BANOPHEN 25 MG CAPSULE: 25 mg | ORAL | Qty: 1

## 2017-06-10 MED FILL — MIDAZOLAM 1 MG/ML IJ SOLN: 1 mg/mL | INTRAMUSCULAR | Qty: 2

## 2017-06-10 MED FILL — CLOTRIMAZOLE 1 % TOPICAL CREAM: 1 % | CUTANEOUS | Qty: 15

## 2017-06-10 MED FILL — SEVOFLURANE FOR INHALATION: RESPIRATORY_TRACT | Qty: 250

## 2017-06-10 MED FILL — LABETALOL 5 MG/ML IV SYRINGE: 20 mg/4 mL (5 mg/mL) | INTRAVENOUS | Qty: 4

## 2017-06-10 MED FILL — LIDOCAINE (PF) 20 MG/ML (2 %) IJ SOLN: 20 mg/mL (2 %) | INTRAMUSCULAR | Qty: 5

## 2017-06-10 NOTE — Op Note (Signed)
Op  Notes by Reita Cliche, MD at 06/10/17 1205                Author: Reita Cliche, MD  Service: Breast Surgery  Author Type: Physician       Filed: 06/10/17 1211  Date of Service: 06/10/17 1205  Status: Signed          Editor: Reita Cliche, MD (Physician)                               Operative Report          Patient: Barbara [name]  MRN: [id number]   SSN: [id number]          Date of Birth: [birth year]   Age: 51 y.o.   Sex: female          Date of Surgery: 06/10/2017      Preoperative Diagnosis: breast cancer left upper outer  c50.412,z17.0       Postoperative Diagnosis: breast cancer left upper outer  c50.412,z17.0       Surgeon(s) and Role:   Panel 1:      * Reita Cliche, MD - Assisting      * [name], MD - Primary      Panel 2:      * [name], MD - Primary      Anesthesia: General       Procedure: Procedure(s):   left mastectomy,Sentinal node biopsies.   bilateral breast reconstruction with TISSUE EXPANDER        Procedure in Detail:    The patient was brought into the operating room. General anesthesia was administered. Two g of IV Ancef were given. Both breasts were prepped and draped in a sterile fashion. Attention was turned first to the sentinel node biopsy.  Incision was taken  down through the base of the axilla.  Clavipectoral fascia was divided.  There were two sentinal nodes that were identified and reported as benign per pathology.      An elliptical incision to include the nipple-areolar complex was outlined as agreed  upon with Dr. Ronnald Ramp. 2-3 mm skin flaps were made superiorly towards clavicle, medially to sternum, inferiorly to inframammary fold and laterally towards latissimus.  The breast was taken off the pectoralis muscle including fascia and some of the upper  axillary tail was removed and submitted to Pathology.The patient was left in the care of Dr. Ronnald Ramp for reconstruction      Estimated Blood Loss:  15      Tourniquet Time: * No  tourniquets in log *        Implants: * No implants in log *              Specimens:            ID  Type  Source  Tests  Collected by  Time  Destination     1 : sentinal node #1  Frozen Section  Breast    [name], MD  06/10/2017 1105  Pathology     2 : sentinal node #2  Frozen Section  Breast    [name], MD  06/10/2017 1106  Pathology             3 : left breast tissue,stitch at noon.  Preservative  Breast    [name], MD  06/10/2017 1205  Pathology  Drains: None                  Complications: None      Counts: Sponge and needle counts were correct times two.         Signed By:   Reita Cliche, MD           June 10, 2017

## 2017-06-10 NOTE — Op Note (Signed)
[place]  OPERATIVE REPORT    Name:G[place]ley, [facility] LYN  MR#: [id number]  DOB: 08/16/1966  ACCOUNT #: [id number]   DATE OF SERVICE: 06/10/2017    PREOPERATIVE DIAGNOSIS:  Left breast upper outer quadrant breast cancer.    POSTOPERATIVE DIAGNOSIS:  Left breast upper outer quadrant breast cancer.    PROCEDURE PERFORMED:  Left breast mastectomy with sentinel node biopsy and right breast prosthesis removal with capsulectomy and bilateral breast tissue expander reconstruction.    SURGEONS:  1.  Marlan Palau MD and Vassie Moselle, MD, general surgery.  2.  Luvenia Heller T. Beverly Sessions, MD plastic surgery.    ASSISTANTS:  Two surgical assistants.    ANESTHESIA:  General.    ESTIMATED BLOOD LOSS:  100 mL or less.    SPECIMENS REMOVED:    1.  Sentinel nodes.  2.  Sentinel nodes.   3.  Left breast tissue.  4.  Right breast capsule.  5.  Right breast previous saline implant.    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    COMPLICATIONS:  None.    IMPLANTS:  Two Allergan saline 500 mL tissue expanders both filled with 200 ml normal saline.    PROCEDURE IN DETAIL:  The patient was preoperatively marked the position of the inframammary lines and taken to the operating room and placed on the operating table in supine position.  Kendall stockings were inflated prior to induction of anesthesia.  The arms abducted to 80 degrees and the palms were supine.  The patient was given a perioperative Foley catheter.    The surgical plan was for the general surgery team to undertake a left breast mastectomy with a sentinel node biopsy and this was to be followed by the reconstructive aspect undertaken by plastic surgeon, Dr. [name].  The left and right breast reconstructive issues would be addressed.    The procedure initiated with all members of the 2 teams present and the elliptical incision lines for the approach to the left breast mastectomy were agreed upon by all parties.  The left  mastectomy was then undertaken along with a sentinel node dissection.  The hemostasis was complete and the wound packed prior to the start of the second phase.      A second phase was in 2 parts.  The first part was the placement of the Allergan tissue expander on the left side and this was done with the fixation tags such that there would be a  minimal rotation and was done with the placement of a #15 Blake drain in the left axilla and secured with 2-0 silk suture.  The tissue expander was emptied of air and filled with 200 mL of saline, which appeared to comfortably permit closure of the wound without tension and adequately expand the tissues at this stage of the procedure.  Wound was closed with interrupted 3-0 Vicryl sutures for the deep subcutaneous tissue and then this was followed with a 3-0 PDS suture to the deep layer of dermis, a 4-0 PDS suture for the superficial layer of the dermis and a continuous 5-0 PDS suture for the epidermal approximation in a subcuticular fashion.  Wound was covered with a bioclusive dressing.    Attention was then turned to the right side where the ellipse of the previous myocutaneous latissimus dorsi flap was divided over a 12 cm length and this took the dissection down to the level of the breast capsule of the previously used saline implant for reconstruction.  This  capsule caused the prosthesis to be positioned over the sternum by virtue of its contraction power.  The capsule; therefore had to be removed in its entirety and this was sent to pathology for examination.  Hemostasis was meticulous.  The lateral inferior defect was then expanded by electrocautery above the level of the pectoralis major muscle and the unnecessary medial pocket was closed with multiple layers of 2-0 Vicryl suture.  Tissue expander was then placed in this new space and positioned proximal to that on the left hand side and the tags were then fixed again with 2-0 Vicryl suture and the prosthesis filled  with 200 mL of saline.  Wound was drained with a #15 Blake drain taken out through the axilla and secured with 2-0 silk suture.  The wound was closed in exactly the same fashion.  The wound was covered with bioclusive dressings and foam tape was used to position all the prostheses and secured the drains.  Patient was returned to the recovery room in stable condition to be admitted to the hospital for 23-hour period of observation.      [name], MD       GTT / BN  D: 06/29/2017 19:55     T: 06/30/2017 08:17  JOB #: [id number]

## 2017-06-10 NOTE — Progress Notes (Signed)
Chart reviewed and pt verified demographics. She has [employer]  And sees Dr Murlean Hark as PCP, last seen last week..She lives with husband Barbara [name] [phone] and her two daughters Barbara [name] and Barbara [name]. Either [name] will drive her home at dc. She has a cane fi needed to ambulate. She says she had PT some time after her last masectomy, unsure if right after or after a healing time. No PT/OT ordered at present.     Reason for Admission: Breast Cancer                     RRAT Score:    0                 Plan for utilizing home health:   TBD                       Likelihood of Readmission:  Low/ green                         Transition of Care Plan:     Home, unsure if [facility] will be ordered at dc, of is she will need a time period for healing.

## 2017-06-10 NOTE — Anesthesia Post-Procedure Evaluation (Signed)
Post-Anesthesia Evaluation and Assessment    Patient: Barbara [name] MRN: [id number]  SSN: [id number]    Date of Birth: 29-Jan-1966  Age: 51 y.o.  Sex: female       Cardiovascular Function/Vital Signs  Visit Vitals   ??? BP 150/88   ??? Pulse 96   ??? Temp 36.3 ??C (97.4 ??F)   ??? Resp 15   ??? Ht 5\' 2"  (1.575 m)   ??? Wt 64 kg (141 lb 1 oz)   ??? SpO2 98%   ??? BMI 25.8 kg/m2       Patient is status post general anesthesia for Procedure(s):  left mastectomy,Sentinal node biopsies.  right breast prosthesis and capsule removal and bilateral breast tissue  expander reconstruction. .    Nausea/Vomiting: None    Postoperative hydration reviewed and adequate.    Pain:  Pain Scale 1: Numeric (0 - 10) (06/10/17 1345)  Pain Intensity 1: 0 (06/10/17 1345)   Managed    Neurological Status:   Neuro (WDL): Within Defined Limits (06/10/17 0727)   At baseline    Mental Status and Level of Consciousness: Arousable    Pulmonary Status:   O2 Device: Oxygen mask (06/10/17 1345)   Adequate oxygenation and airway patent    Complications related to anesthesia: None    Post-anesthesia assessment completed. No concerns    Signed By: Laurena Slimmer, MD     June 10, 2017

## 2017-06-10 NOTE — Nurse Consult (Signed)
Pre op visit. Pt did not sleep well last pm. Many family members here for support. Understands procedure and what to expect post op. Post op pillow given. All questions answered.

## 2017-06-10 NOTE — Progress Notes (Signed)
1900 pm - patient in minimal pain and already ambulating in the hallway and no difficulty after Foley removal - all flaps circutating well - I do not see any further indication for anticoagulation and we could reasonably discharge her home tomorrow.

## 2017-06-10 NOTE — Op Note (Incomplete)
Left breast implant inflated with 100 ml of injectable saline.  Right breast implant inflated with

## 2017-06-10 NOTE — Brief Op Note (Addendum)
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  right breast prosthesis and capsule removal and bilateral breast tissue  expander reconstruction.   Surgeon(s) and Role:  Panel 1:     * [name], MD - Primary     * Reita Cliche, MD - Assisting    Panel 2:     * [name], MD - Primary     * [name], MD - Primary         Surgical Assistant: Two surgical assistants    Surgical Staff:  Circ-1: Riccardo Dubin, RN  Circ-2: Rich Fuchs  Scrub Tech-1: Orland Penman Carloyn Jaeger Tech-Relief: Silva Bandy  Surg Asst-1: Morton Amy  Surg Asst-Relief: Gean Birchwood; Franchot Erichsen  Event Time In   Incision Start 1045   Incision Close 1326     Anesthesia: General   Estimated Blood Loss: 100cc or less  Specimens:   ID Type Source Tests Collected by Time Destination   1 : sentinal node #1 Frozen Section Breast  [name], MD 06/10/2017 1105 Pathology   2 : sentinal node #2 Frozen Section Breast  [name], MD 06/10/2017 1106 Pathology   3 : left breast tissue,stitch at noon. Preservative Breast  [name], MD 06/10/2017 1205 Pathology   4 : capsule from right breast Preservative Breast  [name], MD 06/10/2017 1252 Pathology   5 : breast implant from right breast Other   [name], MD 06/10/2017 1252 Pathology      Findings: right sided capsular contracture with prosthetic displacement   Complications: none  Implants:   Implant Name Type Inv. Item Serial No. Manufacturer Lot No. LRB No. Used Action   natrelle   [id number]   Left 1 Implanted   allergan natrelle 500cc ref# [id number]     [id number]     Right 1 Implanted       Drains : 2x #15 Keenan Bachelor

## 2017-06-10 NOTE — Op Note (Signed)
Operative Report    Patient: Barbara [name] MRN: [id number]  SSN: [id number]    Date of Birth: March 31, 1966  Age: 51 y.o.  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  bilateral breast reconstruction with TISSUE EXPANDER      Procedure in Detail:   The patient was brought into the operating room. General anesthesia was administered. Two g of IV Ancef were given. Both breasts were prepped and draped in a sterile fashion. Attention was turned first to the sentinel node biopsy.  Incision was taken down through the base of the axilla.  Clavipectoral fascia was divided.  There were two sentinal nodes that were identified and reported as benign per pathology.      An elliptical incision to include the nipple-areolar complex was outlined as agreed upon with Dr. Ronnald Ramp. 2-3 mm skin flaps were made superiorly towards clavicle, medially to sternum, inferiorly to inframammary fold and laterally towards latissimus.  The breast was taken off the pectoralis muscle including fascia and some of the upper axillary tail was removed and submitted to Pathology.The patient was left in the care of Dr. Ronnald Ramp for reconstruction    Estimated Blood Loss:  15    Tourniquet Time: * No tourniquets in log *      Implants: * No implants in log *            Specimens:   ID Type Source Tests Collected by Time Destination   1 : sentinal node #1 Frozen Section Breast  [name], MD 06/10/2017 1105 Pathology   2 : sentinal node #2 Frozen Section Breast  [name], MD 06/10/2017 1106 Pathology    3 : left breast tissue,stitch at noon. Preservative Breast  [name], MD 06/10/2017 1205 Pathology           Drains: None                Complications: None    Counts: Sponge and needle counts were correct times two.    Signed By:  Reita Cliche, MD     June 10, 2017

## 2017-06-10 NOTE — Progress Notes (Addendum)
TRANSFER - IN REPORT:    Verbal report received from [facility] on [place]  being received from PACU for routine post - op      Report consisted of patient???s Situation, Background, Assessment and   Recommendations(SBAR).     Information from the following report(s) SBAR and Procedure Summary was reviewed with the receiving nurse.    Opportunity for questions and clarification was provided.    1600 Received pt via bed awake and alert in NAD. Dressing c/d/i with 2 JP's intact. Oriented to call bell, phone and IS with pt giving return demonstration. Family at [facility].

## 2017-06-10 NOTE — Anesthesia Pre-Procedure Evaluation (Signed)
Anesthetic History   No history of anesthetic complications            Review of Systems / Medical History  Patient summary reviewed and pertinent labs reviewed    Pulmonary          Smoker         Neuro/Psych   Within defined limits           Cardiovascular    Hypertension              Exercise tolerance: >4 METS     GI/Hepatic/Renal  Within defined limits              Endo/Other    Diabetes: type 2, using insulin         Other Findings   Comments: Documentation of current medication  Current medications obtained, documented and obtained? YES      Risk Factors for Postoperative nausea/vomiting:       History of postoperative nausea/vomiting?  NO       Female?  YES       Motion sickness?  NO       Intended opioid administration for postoperative analgesia?  YES      Smoking Abstinence:  Current Smoker?   YES  Elective Surgery? YES  Seen preoperatively by anesthesiologist or proxy prior to day of surgery?  YES  Pt abstained from smoking 24 hours prior to anesthesia? N/A    Preventive care/screening for High Blood Pressure:  Aged 18 years and older: YES  Screened for high blood pressure: YES  Patients with high blood pressure referred to primary care provider   for BP management: YES                 Physical Exam    Airway  Mallampati: II  TM Distance: 4 - 6 cm  Neck ROM: normal range of motion   Mouth opening: Normal     Cardiovascular  Regular rate and rhythm,  S1 and S2 normal,  no murmur, click, rub, or gallop  Rhythm: regular  Rate: normal         Dental    Dentition: Lower dentition intact and Upper dentition intact     Pulmonary  Breath sounds clear to auscultation               Abdominal  GI exam deferred       Other Findings            Anesthetic Plan    ASA: 3  Anesthesia type: general          Induction: Intravenous  Anesthetic plan and risks discussed with: Patient

## 2017-06-10 NOTE — Progress Notes (Signed)
Chaplain conducted a pre-surgery visit with Barbara [name], who is a 51 y.o.,female.     The Chaplain provided the following Interventions:  Initiated a relationship of care and support.   Offered prayer and assurance of continued prayers on patient's behalf.     Plan:  Chaplains will continue to follow and will provide pastoral care on an as needed/requested basis.  Chaplain recommends bedside caregivers page chaplain on duty if patient shows signs of acute spiritual or emotional distress.    Gerlean Ren   Spiritual Care   [phone]

## 2017-06-10 NOTE — Progress Notes (Signed)
Problem: Falls - Risk of  Goal: *Absence of Falls  Document Schmid Fall Risk and appropriate interventions in the flowsheet.   Outcome: Progressing Towards Goal  Fall Risk Interventions:  Mobility Interventions: Patient to call before getting OOB         Medication Interventions: Patient to call before getting OOB

## 2017-06-10 NOTE — Progress Notes (Signed)
Bedside shift change report given to Allen Norris, RN (oncoming nurse) by Britt Bolognese, RN (offgoing nurse). Report included the following information SBAR, Kardex, OR Summary, Intake/Output, MAR, Accordion and Recent Results.    2100: Patient asked this RN, "They didn't take any of my lymph nodes did they?", explained sentinel node biopsy to patient. Patient in beach chair position, dressings C/D/I, JP care education provided to patient and family.    2300: Patient ambulated in hall for four minutes, gait steady, returned to bed.     Bedside shift change report given to Sudie Grumbling, RN (oncoming nurse) by Allen Norris, RN (offgoing nurse). Report included the following information SBAR, Kardex, OR Summary, Intake/Output, MAR, Accordion and Recent Results.

## 2017-06-10 NOTE — Other (Addendum)
Barbara [name] [phone]    Although pt having surgery on left breast, IV placed in Left hand due to mastectomy on right side.  No BPs or sticks rt arm    Dr Ronnald Ramp needs to sign his consent form    0845 pt back from nuc med, husband at bedside

## 2017-06-10 NOTE — Interval H&P Note (Signed)
H&P Update:  Barbara [name] was seen and examined.  History and physical has been reviewed. The patient has been examined. There have been no significant clinical changes since the completion of the originally dated History and Physical.    Signed By: Reita Cliche, MD     June 10, 2017 9:14 AM

## 2017-06-10 NOTE — Progress Notes (Signed)
Bedside admission report given by Janace Hoard, RN.  Pt  Is A&OX4. Pt in bed w HOB elevated,, bed locked at lowest position, call bell in reach.  Pt has purposeful movement in all extremities.  IV  Site is c/d/i, no swelling, erythema or infiltration noted at this time.  Pt shows no signs of distress at this time.  Will continue to monitor.    1800 - No change during shift.    1940 Bedside and Verbal shift change report given to [place] [employer] [place] [employer] developer) by Mateo Flow (offgoing nurse). Report included the following information SBAR, Kardex and MAR.

## 2017-06-10 NOTE — Brief Op Note (Signed)
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  bilateral breast reconstruction with TISSUE EXPANDER   Surgeon(s) and Role:  Panel 1:     * Reita Cliche, MD - Assisting     * [name], MD - Primary    Panel 2:     * [name], MD - Primary         Surgical Assistant: see below    Surgical Staff:  Circ-1: Riccardo Dubin, RN  Circ-2: Rich Fuchs  Scrub Tech-1: Orland Penman Carloyn Jaeger Tech-Relief: Silva Bandy  Surg Asst-1: Morton Amy  Surg Asst-Relief: Gean Birchwood  Event Time In   Incision Start 1045   Incision Close      Anesthesia: General   Estimated Blood Loss: 15cc  Specimens:   ID Type Source Tests Collected by Time Destination   1 : sentinal node #1 Frozen Section Breast  [name], MD 06/10/2017 1105 Pathology   2 : sentinal node #2 Frozen Section Breast  [name], MD 06/10/2017 1106 Pathology      Findings: sentinel nodes negative per intraoperative evaluation   Complications: none noted  Implants: * No implants in log *

## 2017-06-10 NOTE — Other (Addendum)
1343 Pt received to PACU and connected to monitor. Bedside report given by Eustaquio Maize, RN. Vital signs stable. Nurse at bedside. Will continue to monitor.     20 Called to update pt's family on current status and room assignment.     1510 Call to anesthesia for elevated b/p 190/91 HR 101. Pt denies SOB, chest pain, or weakness. 5mg  Labetalol ordered x2 per Dr. Nicoletta Dress. See [facility] for dosing.     1530 B/P 163/86. Pt resting comfortably. Pain 2/10. Cleared for transfer to floor.

## 2017-06-11 LAB — GLUCOSE, POC
Glucose (POC): 219 mg/dL — ABNORMAL HIGH (ref 70–110)
Glucose (POC): 172 mg/dL — ABNORMAL HIGH (ref 70–110)
Glucose (POC): 203 mg/dL — ABNORMAL HIGH (ref 70–110)
Glucose (POC): 223 mg/dL — ABNORMAL HIGH (ref 70–110)

## 2017-06-11 MED ORDER — INSULIN GLARGINE 100 UNIT/ML INJECTION
100 unit/mL | Freq: Every evening | SUBCUTANEOUS | Status: DC
Start: 2017-06-11 — End: 2017-06-12

## 2017-06-11 MED ORDER — HYDROCODONE-ACETAMINOPHEN 10 MG-325 MG TAB
10-325 mg | Freq: Four times a day (QID) | ORAL | Status: DC | PRN
Start: 2017-06-11 — End: 2017-06-12
  Administered 2017-06-11 – 2017-06-12 (×3): via ORAL

## 2017-06-11 MED FILL — GABAPENTIN 300 MG CAP: 300 mg | ORAL | Qty: 1

## 2017-06-11 MED FILL — MORPHINE 4 MG/ML INTRAVENOUS SOLUTION: 4 mg/mL | INTRAVENOUS | Qty: 1

## 2017-06-11 MED FILL — INSULIN LISPRO 100 UNIT/ML INJECTION: 100 unit/mL | SUBCUTANEOUS | Qty: 1

## 2017-06-11 MED FILL — HYDROCODONE-ACETAMINOPHEN 10 MG-325 MG TAB: 10-325 mg | ORAL | Qty: 2

## 2017-06-11 MED FILL — RAMIPRIL 5 MG CAP: 5 mg | ORAL | Qty: 2

## 2017-06-11 MED FILL — CLOTRIMAZOLE 1 % TOPICAL CREAM: 1 % | CUTANEOUS | Qty: 15

## 2017-06-11 MED FILL — INSULIN GLARGINE 100 UNIT/ML INJECTION: 100 unit/mL | SUBCUTANEOUS | Qty: 1

## 2017-06-11 NOTE — Discharge Summary (Signed)
Physician Discharge Summary     Patient ID:  Barbara [name]  [id number]  51 y.o.  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  right breast prosthesis and capsule removal and bilateral breast tissue  expander reconstruction.     [facility] Course:   Normal hospital course for this procedure.    Consults: Dr. Ronnald Ramp, plastic surgery    Significant Diagnostic Studies: none    * Disposition: Home    Discharge Medications:   Discharge Medication List as of 06/11/2017  8:26 PM      START taking these medications    Details   oxyCODONE-acetaminophen (PERCOCET) 5-325 mg per tablet Take 1 Tab by mouth every four (4) hours as needed for Pain. Max Daily Amount: 6 Tabs., Print, Disp-30 Tab, R-0      cephALEXin (KEFLEX) 250 mg capsule Take 2 Caps by mouth three (3) times daily., Print, Disp-30 Cap, R-2      naloxone (NARCAN) 4 mg/actuation nasal spray Use 1 spray intranasally, then discard. Repeat with new spray every 2 min as needed for opioid overdose symptoms, alternating nostrils., Normal, Disp-1 Each, R-2         CONTINUE these medications which have NOT CHANGED    Details   gabapentin (NEURONTIN) 300 mg capsule Take 1 Cap by mouth two (2) times a day., Normal, Disp-60 Cap, R-0      LORazepam (ATIVAN) 0.5 mg tablet Take 1 Tab by mouth every eight (8) hours as needed for Anxiety. Max Daily Amount: 1.5 mg.,  Print, Disp-30 Tab, R-0      insulin NPH/insulin regular (NOVOLIN 70/30, HUMULIN 70/30) 100 unit/mL (70-30) injection Administer 25 units subcut twice daily 30 minutes before breakfast and dinner, Normal, Disp-20 mL, R-0      ramipril (ALTACE) 10 mg capsule Take 1 Cap by mouth daily., Normal, Disp-30 Cap, R-3      clotrimazole (LOTRIMIN) 1 % topical cream Apply  to affected area two (2) times a day., Normal, Disp-15 g, R-0      cyclobenzaprine (FLEXERIL) 10 mg tablet Take 1 Tab by mouth two (2) times daily as needed for Muscle Spasm(s)., Print, Disp-14 Tab, R-0         STOP taking these medications       amoxicillin (AMOXIL) 500 mg capsule Comments:   Reason for Stopping:               * Follow-up Care/Patient Instructions:  Activity: Activity as tolerated  Diet: Regular Diet  Wound Care: per PRS    Follow-up Information     Follow up With Details Comments Contact Info    [place], MD   [address]  [place]  [place] [address]  [phone]      [name], MD  3-5 days, call office daily [address]  [place] [address]  [phone]            Signed:  Reita Cliche, MD  06/12/2017  7:50 AM

## 2017-06-11 NOTE — Progress Notes (Signed)
Progress Note    Patient: Barbara [name] MRN: [id number]  SSN: [id number]    Date of Birth: 07-25-1966  Age: 51 y.o.  Sex: female      Admit Date: 06/10/2017    LOS: 1 day     Subjective:     No acute events. No complaints.  Pain controlled with IV meds.  Out of bed already.     Objective:     Vitals:    06/10/17 1606 06/10/17 2050 06/10/17 2322 06/11/17 0422   BP: 168/90 144/82 132/77 158/86   Pulse: 88 71 65 75   Resp: 13 16 16 16    Temp: 97.9 ??F (36.6 ??C) 97.9 ??F (36.6 ??C) 97.9 ??F (36.6 ??C) 98.3 ??F (36.8 ??C)   SpO2: 93% 97% 98% 96%   Weight:       Height:            Intake and Output:  Current Shift: 08/29 0701 - 08/29 1900  In: 492.5 [P.O.:120; I.V.:372.5]  Out: -   Last three shifts: 08/27 1901 - 08/29 0700  In: 2480 [P.O.:480; I.V.:2000]  Out: 4130 [Urine:3920; Drains:160]    Physical Exam:   NAD  resp unlabored  Incisions clean, jp serosang        Assessment:     Active Problems:    Breast cancer of upper-outer quadrant of left female breast ([place]) (06/10/2017)        Plan:     Transition to po meds  Mobilize  Discharge planning    Signed By: Reita Cliche, MD     June 11, 2017

## 2017-06-11 NOTE — Other (Signed)
NUTRITIONAL ASSESSMENT GLYCEMIC CONTROL/ PLAN OF CARE     Barbara [name]           51 y.o.           06/10/2017                 1. 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  On-going diabetes education.  See Diabetes Education Record for details.  Pt is drowsy and limited session.  2.  Monitor glycemic control, labs and weights.    ASSESSMENT:   Nutritional Status:  Pt is 128% ideal wt; pt appears well nourished at this time.  Pt ate small amount of breakfast this AM per I/O's but it appears she has food brought in from outside.        Nutrition Diagnoses:   Altered nutrition related labs due to diabetes as evidenced by A1C of 9.1%.    Diabetes Management:   Pt with varied BG; appears to be eating food brought in from outside by her daughters.    Recent blood glucose:     06/11/17:  172, 203  06/10/17:  133, 129, 148, 219 - received TDD insulin = 14 units  Within target range (non-ICU: <140; ICU<180): []  Yes   []   No  varied    Current Insulin regimen:  Lantus 14 units/day  Corrective lispro, normal insulin sensitivity ACHS   Home medication/insulin regimen:   Novolin 70/30 25 units BID  HbA1c: 9.1% - ave BG has been ~ 213 mg/dL over past 3 months.  Adequate glycemic control PTA:  []  Yes  [x]  No       SUBJECTIVE/OBJECTIVE:   Information obtained from: chart review, pt, dtrs at bedside  Pt  Reports having nausea last PM but it is better now and she is tolerating meals.  She states he dtrs have brought in food and she had ice cream this morning stating it "shot up" her blood glucose. Pt states her weight was stable PTA.  NO known food allergies.    Pt is s/p left mastectomy 06/10/17; breast CA.  PMHx includes T2DM.  Diet: regular no concentrated sweets    Patient Vitals for the past 100 hrs:   % Diet Eaten   06/11/17 0900 10 %     Medications: [x]                 Reviewed      Most Recent POC Glucose: No results for input(s): GLU in the last 72 hours.      Labs:   Lab Results   Component Value Date/Time    Hemoglobin A1c 9.1 (H) 05/15/2017 08:32 AM     Lab Results   Component Value Date/Time    Sodium 141 05/26/2017 11:05 AM    Potassium 4.7 05/26/2017 11:05 AM    Chloride 108 05/26/2017 11:05 AM    CO2 28 05/26/2017 11:05 AM    Anion gap 5 05/26/2017 11:05 AM    Glucose 135 (H) 05/26/2017 11:05 AM    BUN 7 05/26/2017 11:05 AM    Creatinine 0.48 (L) 05/26/2017 11:05 AM    Calcium 9.0 05/26/2017 11:05 AM    Albumin 3.7 05/26/2017 11:05 AM       Anthropometrics: IBW : 49.9 kg (110 lb), % IBW (Calculated): 128.24 %, BMI (calculated): 25.8  Wt Readings from Last 1 Encounters:   06/10/17 64 kg (141  lb 1 oz)      Ht Readings from Last 1 Encounters:   06/10/17 5\' 2"  (1.575 m)     Estimated Nutrition Needs:  1711 Kcals/day, Protein (g): 77 g Fluid (ml): 1700 ml  Based on:   [x]           Actual BW    []           ABW   []             Adjusted BW           Nutrition Interventions:  DM education  Goal:   Blood glucose will be within target range of 70-180 mg/dL by 06/14/17.  Weight maintenance (+/- 1-2 kg) by 06/22/17.        Nutrition Monitoring and Evaluation      [x]      Monitor po intake on meal rounds  [x]      Continue inpatient monitoring and intervention  []      Other:      Nutrition Risk:  []    High     [x]   Moderate    []   Minimal/Uncompromised    Rhea Pink, RD, CDE   Office:  [place] Pager:  [phone]

## 2017-06-11 NOTE — Progress Notes (Signed)
Patient and/or next of kin has been given the ???Outpatient Observation Information and Notification??? letter and all questions answered.

## 2017-06-11 NOTE — Discharge Summary (Signed)
Physician Discharge Summary     Patient ID:  Barbara [name]  [id number]  51 y.o.  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  right breast prosthesis and capsule removal and bilateral breast tissue  expander reconstruction.     [facility] Course:   Normal hospital course for this procedure.    Consults: Dr. Ronnald Ramp, plastic surgery    Significant Diagnostic Studies: none    * Disposition: Home    Discharge Medications:   Discharge Medication List as of 06/11/2017  8:26 PM      START taking these medications    Details   oxyCODONE-acetaminophen (PERCOCET) 5-325 mg per tablet Take 1 Tab by mouth every four (4) hours as needed for Pain. Max Daily Amount: 6 Tabs., Print, Disp-30 Tab, R-0      cephALEXin (KEFLEX) 250 mg capsule Take 2 Caps by mouth three (3) times daily., Print, Disp-30 Cap, R-2      naloxone (NARCAN) 4 mg/actuation nasal spray Use 1 spray intranasally, then discard. Repeat with new spray every 2 min as needed for opioid overdose symptoms, alternating nostrils., Normal, Disp-1 Each, R-2         CONTINUE these medications which have NOT CHANGED    Details   gabapentin (NEURONTIN) 300 mg capsule Take 1 Cap by mouth two (2) times a day., Normal, Disp-60 Cap, R-0      LORazepam (ATIVAN) 0.5 mg tablet Take 1 Tab by mouth every eight (8) hours  as needed for Anxiety. Max Daily Amount: 1.5 mg., Print, Disp-30 Tab, R-0      insulin NPH/insulin regular (NOVOLIN 70/30, HUMULIN 70/30) 100 unit/mL (70-30) injection Administer 25 units subcut twice daily 30 minutes before breakfast and dinner, Normal, Disp-20 mL, R-0      ramipril (ALTACE) 10 mg capsule Take 1 Cap by mouth daily., Normal, Disp-30 Cap, R-3      clotrimazole (LOTRIMIN) 1 % topical cream Apply  to affected area two (2) times a day., Normal, Disp-15 g, R-0      cyclobenzaprine (FLEXERIL) 10 mg tablet Take 1 Tab by mouth two (2) times daily as needed for Muscle Spasm(s)., Print, Disp-14 Tab, R-0         STOP taking these medications       amoxicillin (AMOXIL) 500 mg capsule Comments:   Reason for Stopping:               * Follow-up Care/Patient Instructions:  Activity: Activity as tolerated  Diet: Regular Diet  Wound Care: per PRS    Follow-up Information     Follow up With Details Comments Contact Info    [place], MD   [address]  [place]  [place] [address]  [phone]      [name], MD  3-5 days, call office daily [address]  [place] [address]  [phone]            Signed:  Reita Cliche, MD  06/12/2017  7:50 AM

## 2017-06-11 NOTE — Progress Notes (Signed)
Problem: Falls - Risk of  Goal: *Absence of Falls  Document Schmid Fall Risk and appropriate interventions in the flowsheet.   Outcome: Progressing Towards Goal  Fall Risk Interventions:  Mobility Interventions: Patient to call before getting OOB         Medication Interventions: Patient to call before getting OOB

## 2017-06-11 NOTE — Other (Signed)
Diabetes Patient/Family Education Record    Factors That  May Influence Patients Ability  to Learn or  Comply with Recommendations   []    Language barrier    []    Cultural needs   []    Motivation    []    Cognitive limitation    [x]    Physical - pt drowsyl   []    Education    []    Physiological factors   []    Hearing/vision/speaking impairment   []    Religious beliefs    []    Financial factors   []   Other:   []   No factors identified at this time.     Person Instructed:   [x]    Patient   [x]    Family   []   Other     Preference for Learning:   [x]    Verbal   [x]    Written   []   Demonstration     Level of Comprehension & Competence:    []   Good                                      [x]  Fair                                     []   Poor                             []   Needs Reinforcement   [x]   Teachback completed    Education Component:   [x]   Medication management, including how to administer insulin (if appropriate) and potential medication interactions    [x]   Nutritional management    []   Exercise   []   Signs, symptoms, and treatment of hyperglycemia and hypoglycemia   []  Prevention, recognition and treatment of hyperglycemia and hypoglycemia   []   Importance of blood glucose monitoring and how to obtain a blood glucose meter    []   Instruction on use of the blood glucose meter   [x]   Discuss the importance of HbA1C monitoring    []   Sick day guidelines   []   Proper use and disposal of lancets, needles, syringes or insulin pens (if appropriate)   []   Potential long-term complications (retinopathy, kidney disease, neuropathy, foot care)   [x]  Information about whom to contact in case of emergency or for more information    [x]   Goal:  Patient/family will demonstrate understanding of Diabetes Self Management Skills by: (date) _____9/9/18__  Plan for post-discharge education or self-management support:    [x]  Outpatient class schedule provided            []  Patient Declined     []  Scheduled for outpatient classes (date) _______     Rhea Pink, RD, CDE   Office:  [place] Pager:  [phone]

## 2017-06-11 NOTE — Nurse Consult (Signed)
Post op visit. Pt awake in bed. Two daughters at bedside. Pt did not sleep well last night because she could not get comfortable. C/o soreness to both sides. No nausea. Has been ambulating. Her understanding is she will be discharged to home today. All questions answered.

## 2017-06-11 NOTE — Op Note (Signed)
[place]  OPERATIVE REPORT    Name:Taaffe, [facility] LYN  MR#: [id number]  DOB: [birth year]  ACCOUNT #: [id number]   DATE OF SERVICE: 06/10/2017    PREOPERATIVE DIAGNOSIS:  Left breast upper outer quadrant breast cancer.    POSTOPERATIVE DIAGNOSIS:  Left breast upper outer quadrant breast cancer.    PROCEDURE PERFORMED:  Left breast mastectomy with sentinel node biopsy and right breast prosthesis removal with capsulectomy and bilateral breast tissue expander reconstruction.    SURGEONS:  1.  Marlan Palau MD and Vassie Moselle, MD, general surgery.  2.  Luvenia Heller T. Beverly Sessions, MD plastic surgery.    ASSISTANTS:  Two surgical assistants.    ANESTHESIA:  General.    ESTIMATED BLOOD LOSS:  100 mL or less.    SPECIMENS REMOVED:    1.  Sentinel nodes.  2.  Sentinel nodes.   3.  Left breast tissue.  4.  Right breast capsule.  5.  Right breast previous saline implant.    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    COMPLICATIONS:  None.    IMPLANTS:  Two Allergan saline 500 mL tissue expanders both filled with 200 ml normal saline.    PROCEDURE IN DETAIL:  The patient was preoperatively marked the position of the inframammary lines and taken to the operating room and placed on the operating table in supine position.  Kendall stockings were inflated prior to induction of anesthesia.  The arms abducted to 80 degrees and the palms were supine.  The patient was given a perioperative Foley catheter.    The surgical plan was for the general surgery team to undertake a left breast mastectomy with a sentinel node biopsy and this was to be followed by the reconstructive aspect undertaken by plastic surgeon, Dr. [name].  The left and right breast reconstructive issues would be addressed.    The procedure initiated with all members of the 2 teams present and the elliptical incision lines for the approach to the left breast mastectomy  were agreed upon by all parties.  The left mastectomy was then undertaken along with a sentinel node dissection.  The hemostasis was complete and the wound packed prior to the start of the second phase.      A second phase was in 2 parts.  The first part was the placement of the Allergan tissue expander on the left side and this was done with the fixation tags such that there would be a  minimal rotation and was done with the placement of a #15 Blake drain in the left axilla and secured with 2-0 silk suture.  The tissue expander was emptied of air and filled with 200 mL of saline, which appeared to comfortably permit closure of the wound without tension and adequately expand the tissues at this stage of the procedure.  Wound was closed with interrupted 3-0 Vicryl sutures for the deep subcutaneous tissue and then this was followed with a 3-0 PDS suture to the deep layer of dermis, a 4-0 PDS suture for the superficial layer of the dermis and a continuous 5-0 PDS suture for the epidermal approximation in a subcuticular fashion.  Wound was covered with a bioclusive dressing.    Attention was then turned to the right side where the ellipse of the previous myocutaneous latissimus dorsi flap was divided over a 12 cm length and this took the dissection down to the level of the breast capsule of the previously used saline implant for reconstruction.  This  capsule caused the prosthesis to be positioned over the sternum by virtue of its contraction power.  The capsule; therefore had to be removed in its entirety and this was sent to pathology for examination.  Hemostasis was meticulous.  The lateral inferior defect was then expanded by electrocautery above the level of the pectoralis major muscle and the unnecessary medial pocket was closed with multiple layers of 2-0 Vicryl suture.  Tissue expander was then placed in this new space and positioned proximal to that on the left hand side and the tags were then fixed again  with 2-0 Vicryl suture and the prosthesis filled with 200 mL of saline.  Wound was drained with a #15 Blake drain taken out through the axilla and secured with 2-0 silk suture.  The wound was closed in exactly the same fashion.  The wound was covered with bioclusive dressings and foam tape was used to position all the prostheses and secured the drains.  Patient was returned to the recovery room in stable condition to be admitted to the hospital for 23-hour period of observation.      [name], MD       GTT / BN  D: 06/29/2017 19:55     T: 06/30/2017 08:17  JOB #: [id number]

## 2017-06-11 NOTE — Telephone Encounter (Signed)
Placed call to Dr. Oleh Genin in regards to [facility] with Utilization Review she would like a "downgrade status" on this patient.  [phone].  Dr. Oleh Genin took information and will be contacting floor nurses in [facility] to possibly provide verbal order for "downgrade status".

## 2017-06-12 MED ORDER — OXYCODONE-ACETAMINOPHEN 5 MG-325 MG TAB
5-325 mg | ORAL_TABLET | ORAL | 0 refills | Status: DC | PRN
Start: 2017-06-12 — End: 2017-09-15

## 2017-06-12 MED ORDER — NALOXONE 4 MG/ACTUATION NASAL SPRAY
4 mg/actuation | NASAL | 2 refills | Status: DC
Start: 2017-06-12 — End: 2017-09-15

## 2017-06-12 MED ORDER — CEPHALEXIN 250 MG CAP
250 mg | ORAL_CAPSULE | Freq: Three times a day (TID) | ORAL | 2 refills | Status: DC
Start: 2017-06-12 — End: 2017-09-15

## 2017-06-12 MED FILL — HYDROCODONE-ACETAMINOPHEN 10 MG-325 MG TAB: 10-325 mg | ORAL | Qty: 2

## 2017-06-12 NOTE — Telephone Encounter (Signed)
Post hospital discharge f/u call to pt. Pt states she slept well last pm. Pain under control. No nausea and she ate a good breakfast. Reminded her of f/u ov with Dr Jennye Boroughs on 06-18-17. All questions answered.

## 2017-06-17 ENCOUNTER — Encounter

## 2017-06-17 NOTE — Telephone Encounter (Signed)
done

## 2017-06-17 NOTE — Telephone Encounter (Signed)
Please advise.

## 2017-06-17 NOTE — Telephone Encounter (Signed)
Pt. Stated that [facility] did not receive prescription for gabapentin. Please send again.

## 2017-06-18 ENCOUNTER — Ambulatory Visit: Attending: Surgery | Primary: Nurse Practitioner

## 2017-06-18 ENCOUNTER — Ambulatory Visit
Admit: 2017-06-18 | Discharge: 2017-06-18 | Payer: PRIVATE HEALTH INSURANCE | Attending: Surgery | Primary: Nurse Practitioner

## 2017-06-18 DIAGNOSIS — C50412 Malignant neoplasm of upper-outer quadrant of left female breast: Secondary | ICD-10-CM

## 2017-06-18 MED ORDER — GABAPENTIN 300 MG CAP
300 mg | ORAL_CAPSULE | Freq: Two times a day (BID) | ORAL | 3 refills | Status: DC
Start: 2017-06-18 — End: 2017-08-12

## 2017-06-18 NOTE — Progress Notes (Signed)
Chief Complaint   Patient presents with   ??? Surgical Follow-up     left breast mastectomy and sentinal node biopsy 8.28.18.  C/O muscles in upper left arm hurts up to about 5or 6 with burning.  Denies pain in arm.  Dr. Ronnald Ramp said everything looked good.     1. Have you been to the ER, urgent care clinic since your last visit?  Hospitalized since your last visit?No    2. Have you seen or consulted any other health care providers outside of the [place] since your last visit?  Include any pap smears or colon screening. No

## 2017-06-18 NOTE — Telephone Encounter (Signed)
gabapentin (NEURONTIN) 300 mg capsule.     [place] RD. This encounter will closed.

## 2017-06-18 NOTE — Nurse Consult (Signed)
Post op f/u ov with Dr Jennye Boroughs. Pt had f/u with Dr Ronnald Ramp yesterday.  JP drains intact. F/u with him on 06-20-17. No RT needed. She would like PT to left arm. Will consult with Dr Brent [facility] to discuss any role for chemotherapy, BRCA testing, or hormone blockers. F/u ov with Dr Jennye Boroughs in one month. All questions answered.

## 2017-06-18 NOTE — Progress Notes (Signed)
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 Ronnald Ramp 06/10/17 as well as a history of right breast cancer in 2008. She presented August 2018  for evaluation regarding a new diagnosis of a left breast cancer.  The patient had a recent left breast mammogram that showed an area of calcifications and possibly small mass in about the 1130 position 3 cm from the nipple measuring about 1.8 cm.  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  The tumor is estrogen and progesterone receptor positive but HER-2 is pending.    Of significance is the fact that back in 2008 the patient developed right breast cancer which apparently was fairly large.  She believes she had gene testing at that time when she was 51 years old.  She underwent a mastectomy and apparently had neoadjuvant chemotherapy.  Subsequently she had radiation therapy.  She apparently had some latissimus dorsi flap reconstruction but this failed and then subsequent to that she had an implant.  Subsequent to that she had a reduction mammoplasty of the left breast.  She has not been aware of any recurrent disease since her first cancer until the development of this new cancer in the other breast.    The patient was unhappy with the implant results on her right breast.  She seems to be debating whether or not she just wants to have a mastectomy on the left side and removal of the implant on her right side and then just go with prostheses.  Her other option would be to have expanders and ultimately an implant placed on the left side if we do a mastectomy and then revision of the right side.  Another option that came up during this discussion was having a lumpectomy with radiation therapy on the left side  but the patient seems to be leaning in the direction of a mastectomy.      She presents today 06/18/17 after left mastectomy, sentinel node biopsy and reconstruction with TE and revision of right breast.  She is overall doing well and is without complaint. She is thrilled with her result. She reports limited range of motion left arm.     Allergies   Allergen Reactions   ??? Tramadol Anaphylaxis     Past Medical History:   Diagnosis Date   ??? Back pain    ??? Breast cancer ([place]) 2008 or 2009   ??? Diabetes ([place])    ??? FH: chemotherapy    ??? Radiation therapy complication 8413    NOT complication, just radiation     Past Surgical History:   Procedure Laterality Date   ??? BIOPSY BREAST     ??? BIOPSY FINE NEEDLE     ??? HX BREAST AUGMENTATION Right 2010   ??? HX BREAST RECONSTRUCTION Right 2011    per pt - attempted latissimus dorsi flap, then saline implant   ??? HX BREAST RECONSTRUCTION Bilateral 06/10/2017    right breast prosthesis and capsule removal and bilateral breast tissue  expander reconstruction.  performed by [name], MD at [facility]   ??? HX CESAREAN SECTION     ??? HX LUMBAR DISKECTOMY      herniated disc   ??? HX MASTECTOMY Right 2010    Total  mastectomy per pt.     ??? HX MASTECTOMY Left 06/10/2017    left mastectomy,Sentinal node biopsies. performed by [name], MD at [facility]   ??? HX MASTOPEXY (BREAST LIFT) Left 2011   ??? IMPLANT BREAST SILICONE/EQ Right 9811    post mastectomy   ??? MULTIPLE DELIVERY C-SECTION      x2   ??? THUMB SUPPORT      thumb surgery to mobilize thumb     Social History     Social History   ??? Marital status: MARRIED     Spouse name: N/A   ??? Number of children: N/A   ??? Years of education: N/A     Social History Main Topics   ??? Smoking status: Current Every Day Smoker     Packs/day: 0.50   ??? Smokeless tobacco: Never Used   ??? Alcohol use No   ??? Drug use: No   ??? Sexual activity: Not on file     Other Topics Concern   ??? Not on file     Social History Narrative     REVIEW OF SYSTEMS      Constitutional: No fever, weight loss, fatigue or recent chills.     Skin:  No recent rashes, dermatitis or abnormal moles.     HEENT:  No changes in vision, vertigo, epistaxis, dysphasia, or hoarseness.     Cardiac:  No chest pain, palpitations, or edema.      Respiratory: No chronic cough, shortness of breath, wheezing, hemoptysis, or history of sleep apnea.  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 No history of GERD.     Genitourinary:  No history of hematuria, dysuria, frequency, or stress urinary incontinence. No nocturia.     Musculoskeletal: No weakness, joint pains, or arthritis.  Patient does have a history of chronic back pain     Endocrine:  No history of thyroid disease.  History of insulin-dependent diabetes with neuropathy.  Patient's last hemoglobin A1c was over 8     Lymph/hemo:  No history of blood transfusions or easy bruising. No anemia.  Patient states that she had "blood clots" with previous surgeries but she pointed to her chest     Neuro: No dizziness or headaches or fainting.  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    PHYSICAL EXAM    Visit Vitals   ??? BP (!) 176/111 (BP 1 Location: Right leg, BP Patient Position: Sitting)   ??? Pulse 76   ??? Temp 96.2 ??F (35.7 ??C) (Oral)   ??? Resp 16   ??? Ht '5\' 2"'$  (1.575 m)   ??? Wt 64.4 kg (142 lb)   ??? SpO2 96%   ??? BMI 25.97 kg/m2          Constitutional:  Well-developed, well-nourished, no acute distress.  Patient understandably very anxious today and upset about the new diagnosis of breast cancer     Head:  Head, eyes, ears, nose, throat within normal limits.     Skin:  No suspicious moles or rashes.     Neck:  No masses or adenopathy. The airway appears normal. Thyroid is not  enlarged and there are no palpable thyroid nodules.  Lungs:  Lungs are clear to auscultation and percussion. No respiratory distress. No chest wall tenderness.  Slightly coarse breath sounds     Heart:  Heart is regular with no extra heart sounds or murmur heard.     Breast Exam: incisions clean, flaps viable, ecchymosis left, JP serosang     Abdomen:  The abdomen is soft and nontender without organomegaly or masses. Bowel sounds are active and of normal pitch. There is no abdominal distention. No hernias are evident.     Extremities:  No tenderness of the extremities and no significant swelling. ROM left arm limited due to discomfort     Psych:  Alert and oriented.    Pathology:  06/10/17     A: SENTINEL LYMPH NODE #1, LEFT AXILLA, BIOPSY:   ONE LYMPH NODE, NEGATIVE FOR MALIGNANCY (0/1).   B: SENTINEL LYMPH NODE #2, LEFT AXILLA, BIOPSY:   ONE LYMPH NODE, NEGATIVE FOR MALIGNANCY (0/1).   C: LEFT BREAST, SIMPLE MASTECTOMY:   INVASIVE CARCINOMA OF THE BREAST.   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   Nuclear Grade: Grade II (intermediate)   Lobular Carcinoma In Situ (LCIS): Present   Tumor Extent   Nipple DCIS: DCIS does not involve the nipple epidermis   Accessory Findings   Treatment Effect in the Breast: No known presurgical therapy   Treatment Effect in the Lymph Nodes: No known presurgical therapy   MARGINS   Invasive Carcinoma Margins: Uninvolved by invasive carcinoma   Distance from Closest Margin in Millimeters: Distance is > 10 Millimeters   Closest Margin: Posterior   DCIS Margins: Uninvolved by DCIS    Distance of DCIS from Closest Margin in Millimeters: Distance is > 10 Millimeters   Closest Margin: Posterior   LYMPH NODES   Number of Lymph Nodes with Macrometastases (> 2 mm): 0   Number of Lymph Nodes with Micrometastases (> 0.2 mm to 2 mm and / or > 200 cells): 0   Number of Lymph Nodes with Isolated Tumor Cells (<= 0.2 mm and <= 200 cells): 0   Number of Lymph Nodes Examined: 2 (see above)   Number of Sentinel Nodes Examined: 2   PATHOLOGIC STAGE CLASSIFICATION (pTNM, AJCC 8th Edition)   Primary Tumor (Invasive Carcinoma) (pT): pT1c   Regional Lymph Nodes (pN)   Modifier: (sn): Only sentinel node(s) evaluated.   Category (pN): pN0   D: RIGHT BREAST, CAPSULECTOMY:   BENIGN SOFT TISSUE IDENTIFIED.   E: IMPLANT, RIGHT BREAST, REMOVAL:   BREAST PROSTHESIS IDENTIFIED (GROSS EXAMINATION ONLY).       05/16/17   LEFT BREAST MASS AT 11:30 O'CLOCK (2-3 CM FROM NIPPLE), CORE BIOPSIES:   INFILTRATING CARCINOMA, NUCLEAR GRADE 1, WITH MIXED DUCTAL AND LOBULAR   FEATURES.   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 Ronnald Ramp 06/10/17 as well as a history of right breast cancer in 2008.    #2 diabetes with neuropathy and relatively poor control with IMA globin A1c of about 8 recently.  #3 chronic back pain #4 long-term smoker probable mild COPD #5 history of "blood clots"???we will have to try to further clarify this with the patient.    Recommendations.  The pathology was reviewed in detail.  I have referred her to medical oncology.  We discussed there is no need for postmastectomy radiation. We discussed the possibility of Oncotype or Mammaprint for her cancer.  These tests have not been sent yet.  I will defer to medical  oncology.  She is interested in physical therapy referral as well.  I have again discussed repeating her genetic testing for panel testing as she had previous BRCA1/2 testing done.  She has had coffee in the waiting room so we cannot do off of saliva now.  Follow up 1 month.  Please call sooner with questions or concerns.

## 2017-06-18 NOTE — Patient Instructions (Signed)
If you have any questions or concerns about today's appointment, the verbal and/or written instructions you were given for follow up care, please call our office at [phone].    [facility]  [address], [place] [address]    [phone] office  [phone] fax    Appointment:  Wednesday, June 25, 2017 at 12:15pm  with Dr. Annette Stable at [facility] [place] [address] [phone]

## 2017-06-19 NOTE — Progress Notes (Signed)
Appointment with Dr. Annette Stable at [facility] located at [place], [place]  on June 25, 2017 at 12:15pm.  Referral was requested from PCP office for this scheduled appointment.

## 2017-06-20 MED FILL — LIDOCAINE (PF) 20 MG/ML (2 %) IJ SOLN: 20 mg/mL (2 %) | INTRAMUSCULAR | Qty: 60

## 2017-06-20 MED FILL — PROPOFOL 10 MG/ML IV EMUL: 10 mg/mL | INTRAVENOUS | Qty: 150

## 2017-06-20 MED FILL — QUELICIN 20 MG/ML INJECTION SOLUTION: 20 mg/mL | INTRAMUSCULAR | Qty: 100

## 2017-06-30 ENCOUNTER — Inpatient Hospital Stay: Admit: 2017-06-30 | Payer: PRIVATE HEALTH INSURANCE | Primary: Nurse Practitioner

## 2017-06-30 DIAGNOSIS — C50412 Malignant neoplasm of upper-outer quadrant of left female breast: Secondary | ICD-10-CM

## 2017-06-30 NOTE — Progress Notes (Signed)
[facility] ??? [address], [address], [place] [address] - Phone: [phone]  Fax: [phone]  DISCHARGE [facility] FOR PHYSICAL THERAPY          Patient Name: Barbara [name] DOB: 06-25-1966   Treatment/Medical Diagnosis: Malignant neoplasm of upper-outer quadrant of left female breast [C50.412]  Lymphedema of left upper extremity [I89.0]   Onset Date: 06/10/17    Referral Source: Reita Cliche, MD Start of Care [facility]): 06/30/17   Prior Hospitalization: See Medical History Provider #: [phone]   Prior Level of Function: Hx of R Breast Cancer 2008, L Breast Cancer   Comorbidities: HBP, Cancer, DM, Visually Impaired, Tobacco Use   Medications: Verified on Patient Summary List   Visits from [facility]: 1 Missed Visits: 4       Key Functional Changes/Progress:  Pt was seen for her IE on 06/30/17 and did not return to therapy following her IE. Pt has not been seen in over 30 days and will be DC from therapy at this time.   Assessments/Recommendations: Discontinue therapy due to lack of attendance or compliance.  If you have any questions/comments please contact us directly at [phone].   Thank you for allowing [place] to assist in the care of your patient.    Therapist Signature: Janelle Floor, PT, DPT   Date: 08/04/2017   Reporting Period: NA Time: 6:25 PM

## 2017-06-30 NOTE — Progress Notes (Signed)
PHYSICAL THERAPY - DAILY TREATMENT NOTE    Patient Name: Barbara [name]        Date: 06/30/2017  DOB: 09-17-1966   yes Patient DOB Verified  Visit #:   1   of   8  Insurance: Payor: [employer] / Plan: [place] / Product Type: HMO /      In time: 1215 Out time: 100   Total Treatment Time: 45     Medicare/BCBS Time Tracking (below)   Total Timed Codes (min):  na 1:1 Treatment Time:  na     TREATMENT AREA =  Malignant neoplasm of upper-outer quadrant of left female breast [C50.412]    SUBJECTIVE  Pain Level (on 0 to 10 scale):  7  / 10   Medication Changes/New allergies or changes in medical history, any new surgeries or procedures?    no  If yes, update Summary List   Subjective Functional Status/Changes:  []   No changes reported     SEE POC         OBJECTIVE    30 min Evaluation       15 min Therapeutic Activity: Education on s/s precautions for incision infections, HEP   Rationale: increase ROM and decreased risk of infection  to improve the patient???s ability to perform functional ADL's     min Patient Education:  yes  Established HEP   []   Progressed/Changed HEP based on:       Other Objective/Functional Measures:    SEE POC     Post Treatment Pain Level (on 0 to 10) scale:   7  / 10     ASSESSMENT  Assessment/Changes in Function:     Evaluation Code Complexity: History HIGH Complexity :3+ comorbidities / personal factors will impact the outcome/ POC ; Examination HIGH Complexity : 4+ Standardized tests and measures addressing body structure, function, activity limitation and / or participation in recreation ; Presentation MEDIUM Complexity : Evolving with changing characteristics ; Decision Making MEDIUM Complexity : FOTO score of 26-74; Complexity MEDIUM    Justification for Eval Code Complexity:  Patient History : HBP, DM, Visually Impaired, tobacco use, Cancer  Examination SEE ABOVE EXAM  Clinical Presentation: evolving pain s/p surgery  Clinical Decision Making : FOTO 42          []   See Progress Note/Recertification   Patient will continue to benefit from skilled PT services to modify and progress therapeutic interventions, address functional mobility deficits, address ROM deficits, address strength deficits, analyze and address soft tissue restrictions, analyze and cue movement patterns, analyze and modify body mechanics/ergonomics and assess and modify postural abnormalities to attain remaining goals.   Progress toward goals / Updated goals:         PLAN  []   Upgrade activities as tolerated yes Continue plan of care   []   Discharge due to :    [x]   Other: Pt will be seen 2 times per week for 8 sessions.      Therapist: Janelle Floor, PT, DPT    Date: 06/30/2017 Time: 2:11 PM     Future Appointments  Date Time Provider [place]   07/08/2017 9:45 AM Astrid Divine Laila Myhre, PT [facility]   07/10/2017 12:00 PM Garnette Czech, PT [facility]   07/14/2017 10:45 AM Astrid Divine Indigo Chaddock, PT [place]   07/16/2017 2:15 PM Astrid Divine Aurthur Wingerter, PT [facility]   07/21/2017 11:00 AM Reita Cliche, MD [facility] [place]  07/21/2017 2:15 PM Astrid Divine Mame Twombly, PT [facility]   07/23/2017 2:15 PM Garnette Czech, PT [facility]   07/28/2017 2:15 PM Astrid Divine Jatavion Peaster, PT [facility]   07/30/2017 2:15 PM Astrid Divine Hal Norrington, PT [facility]

## 2017-06-30 NOTE — Progress Notes (Signed)
[facility] ??? [address], [address],[place] [address] - Phone: [phone]  Fax: [phone]  PLAN OF CARE / [place]  Patient Name: Barbara [name] DOB: May 04, 1966   Medical   Diagnosis: Malignant neoplasm of upper-outer quadrant of left female breast [C50.412] Treatment Diagnosis: Malignant neoplasm of upper-outer quadrant of left female breast [C50.412]   Onset Date: 06/10/17     Referral Source: Reita Cliche, MD Start of Care [facility]): 06/30/2017   Prior Hospitalization: See medical history Provider #: [phone]   Prior Level of Function: Hx of R Breast Cancer 2008, L Breast Cancer   Comorbidities: HBP, Cancer, DM, Visually Impaired, Tobacco Use   Medications: Verified on Patient Summary List   The Plan of Care and following information is based on the information from the initial evaluation.   ==================================================================================  Assessment / key information:  Pt is 51 year old female presenting s/p L Mastectomy, sentinel node biopsy with reconstruction and revision of right breast  on 06/10/17. Pt has PMHx of R breast cancer dx in 2008. Pt underwent a R mastectomy in 2008 with chemotherapy. Pt had implant placed in the R which was removed during her mastectomy on 06/10/17. Pt presented today with c/o pain, swelling, and soreness in the L shoulder, L flank, L breast, and L side of the neck and upper trap. Pt had previous lymphedema therapy on the R in 2008 with good results. Pt presents with FHRS posture in sitting with moderate slumped posture due to pain. Incision appear to healing on the R with thin incision line covered by clear adhesive bandage. Incision on the L appears significantly larger in size with dark brown color, glossy appearance, and redish outer edges. No drainage noted  of either incision. Moderate pain reported 7-10/10, increased with sleeping, walking, ADL's, bathing and dressing, and sitting straight. Pt required assistance from daughters for all ADL's at this time. Increased UT tension on the L, with decreased L AROM shoulder/UE flexion to 60 degrees with pain.  R AROM shoulder flexion 140 with pain. Strength testing deferred at this time. Edema/girth measurements done BL- listed below. Pt will benefit from PT interventions to address the aforementioned deficits and allow pt to return to PLOF.     R UE L UE   Axillary 44 44.5   Bicep (13 cm from elbow) 26 26   Elbow 23.5 24   Forearm (11 cm from elbow) 19 19.75   Wrist 15.75 15.5   Hand 17 17.5   ==================================================================================  Eval Complexity: History HIGH Complexity :3+ comorbidities / personal factors will impact the outcome/ POC ;  Examination  HIGH Complexity : 4+ Standardized tests and measures addressing body structure, function, activity limitation and / or participation in recreation ; Presentation MEDIUM Complexity : Evolving with changing characteristics ;  Decision Making MEDIUM Complexity : FOTO score of 26-74; Overall Complexity MEDIUM  Problem List: pain affecting function, decrease ROM, decrease strength, edema affecting function, decrease ADL/ functional abilitiies, decrease activity tolerance, decrease flexibility/ joint mobility and decrease transfer abilities   Treatment Plan may include any combination of the following: Therapeutic exercise, Therapeutic activities, Neuromuscular re-education, Physical agent/modality, Manual therapy, Patient education, Self Care training and Functional mobility training  Patient / Family readiness to learn indicated by: asking questions, trying to perform skills and interestPersons(s) to be included in education: patient (P)  Barriers to Learning/Limitations: None  Measures taken:  Patient Goal (s): "less pain, more movement"   Patient self reported health status: good  Rehabilitation Potential: good? Short Term Goals: To be accomplished in  4  treatments:  1. Pt to demonstrate independence with HEP to improve scapular stability and shoulder strength for ADLs.  2. Pt to demonstrate improved posture >50% of time in PT to improve axial stability for ADLs.    ? Long Term Goals: To be accomplished in  8  treatments:  1. Pt to demonstrate 160 degrees L shoulder flexion/scaption AROM to improve functional mobility for ADLs.  2) Pt will verbalize with 100% accuracy which signs and symptoms to report immediately to physician concerning their condition.  3) Pt will be Independent with compression management routine consisting of skin care, decongestive exercises, self-manual lymphatic stimulation techniques, strengthening and motion exercises, and don/doff/care of appropriate compression garment(s).  4. Patient will be independent with long term HEP in order to prepare for DC to home.    Frequency / Duration:   Patient to be seen  2  times per week for 8  treatments:  Patient / Caregiver education and instruction: exercises  G-Codes (GP): NA  Therapist Signature: Janelle Floor, PT, DPT   Date: 0/96/0454   Certification Period: NA Time: 8:47 AM   ==================================================================================  I certify that the above Physical Therapy Services are being furnished while the patient is under my care.  I agree with the treatment plan and certify that this therapy is necessary.    Physician Signature:       Date:      Time:  Please sign and return to [facility] or you may fax the signed copy to [phone].  Thank you.

## 2017-07-01 NOTE — Telephone Encounter (Signed)
Called patient to confirm appointment 9.20.18 at 1430.  Patient agreed to time.

## 2017-07-01 NOTE — Telephone Encounter (Signed)
Barbara [name] office called in regards to patient in his office crying.  Called Dr. Jennye Boroughs to advise that Dr. Ronnald Ramp wanted to speak with her in regards to patient being upset no other information provided. Provided Dr. Jennye Boroughs with number.

## 2017-07-03 ENCOUNTER — Ambulatory Visit
Admit: 2017-07-03 | Discharge: 2017-07-03 | Payer: PRIVATE HEALTH INSURANCE | Attending: Surgery | Primary: Nurse Practitioner

## 2017-07-03 DIAGNOSIS — Z170 Estrogen receptor positive status [ER+]: Secondary | ICD-10-CM
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
## 2017-07-03 NOTE — Nurse Consult (Signed)
F/u visit with Dr Jennye Boroughs. Pt is healing better. Has f/u ov with Dr Ronnald Ramp tomorrow. Will begin PT on left arm after she has completely healed. Dr Murlean Hark is currently treating pt for anxiety. Pt will f/u with Dr Jennye Boroughs next month. All questions answered.  NCCN Distress Tool    Barbara [name]  was assessed for management of distress using the NCCN Distress Thermometer for Patients tool.      Mild to moderate distress  Scoring:  0-4        Yes    No    -Practical/physical issues       [x]       []       -Provide community resources      [x]       []       -Provide list of support groups      [x]       []       -Provide list of national organizations and websites              [x]       []       -Provide ongoing supportive care by oncology medical team        [x]       []                   Comments/Referrals/Services provided: .    Moderate to severe distress  Scoring:  5 or greater                   Yes    No    -Navigator consulted with MD      [x]       []      -Navigator follow up         [x]       []      -Spiritual concerns        []       [x]      -Emotional/family concerns       [x]       []      -Refer to social worker/mental health professional/PCP   [x]       []       Comments/Referral/Services provided: Score:6. Dr Jennye Boroughs aware. Pt is currently under care for anxiety. Will reassess in the future.

## 2017-07-04 ENCOUNTER — Encounter: Attending: Internal Medicine | Primary: Nurse Practitioner

## 2017-07-08 ENCOUNTER — Inpatient Hospital Stay: Payer: PRIVATE HEALTH INSURANCE | Primary: Nurse Practitioner

## 2017-07-08 ENCOUNTER — Inpatient Hospital Stay: Admit: 2017-07-08 | Payer: PRIVATE HEALTH INSURANCE | Primary: Nurse Practitioner

## 2017-07-08 DIAGNOSIS — Z01812 Encounter for preprocedural laboratory examination: Secondary | ICD-10-CM

## 2017-07-08 LAB — URINALYSIS W/ RFLX MICROSCOPIC
Bilirubin: NEGATIVE
Blood: NEGATIVE
Glucose: NEGATIVE mg/dL
Ketone: NEGATIVE mg/dL
Nitrites: NEGATIVE
Protein: NEGATIVE mg/dL
Specific gravity: 1.01 (ref 1.005–1.030)
Urobilinogen: 0.2 EU/dL (ref 0.2–1.0)
pH (UA): 6 (ref 5.0–8.0)

## 2017-07-08 LAB — CBC W/O DIFF
HCT: 40.9 % (ref 35.0–45.0)
HGB: 14 g/dL (ref 12.0–16.0)
MCH: 31.5 PG (ref 24.0–34.0)
MCHC: 34.2 g/dL (ref 31.0–37.0)
MCV: 92.1 FL (ref 74.0–97.0)
MPV: 10.7 FL (ref 9.2–11.8)
PLATELET: 285 10*3/uL (ref 135–420)
RBC: 4.44 M/uL (ref 4.20–5.30)
RDW: 12.8 % (ref 11.6–14.5)
WBC: 10.9 10*3/uL (ref 4.6–13.2)

## 2017-07-08 LAB — METABOLIC PANEL, COMPREHENSIVE
A-G Ratio: 0.9 (ref 0.8–1.7)
ALT (SGPT): 29 U/L (ref 13–56)
AST (SGOT): 23 U/L (ref 15–37)
Albumin: 3.7 g/dL (ref 3.4–5.0)
Alk. phosphatase: 138 U/L — ABNORMAL HIGH (ref 45–117)
Anion gap: 9 mmol/L (ref 3.0–18)
BUN/Creatinine ratio: 19 (ref 12–20)
BUN: 8 MG/DL (ref 7.0–18)
Bilirubin, total: 0.3 MG/DL (ref 0.2–1.0)
CO2: 29 mmol/L (ref 21–32)
Calcium: 9.4 MG/DL (ref 8.5–10.1)
Chloride: 104 mmol/L (ref 100–108)
Creatinine: 0.43 MG/DL — ABNORMAL LOW (ref 0.6–1.3)
GFR est AA: >60 mL/min/{1.73_m2} (ref >60)
GFR est non-AA: >60 mL/min/{1.73_m2} (ref >60)
Globulin: 3.9 g/dL (ref 2.0–4.0)
Glucose: 100 mg/dL — ABNORMAL HIGH (ref 74–99)
Potassium: 3.7 mmol/L (ref 3.5–5.5)
Protein, total: 7.6 g/dL (ref 6.4–8.2)
Sodium: 142 mmol/L (ref 136–145)

## 2017-07-08 LAB — URINE MICROSCOPIC ONLY
Bacteria: NEGATIVE /hpf
RBC: 0 /hpf (ref 0–5)
WBC: 6 /hpf (ref 0–4)

## 2017-07-08 NOTE — Other (Signed)
PAT - SURGICAL PRE-ADMISSION INSTRUCTIONS    NAME:  Barbara [name]                                                          TODAY'S DATE:  07/08/2017    SURGERY DATE:  07/09/2017                                  SURGERY ARRIVAL TIME:   TBA (OFFICE TO NOTIFY PATIENT)    1. Do NOT eat or drink anything, including candy or gum, after MIDNIGHT on 07/08/17 , unless you have specific instructions from your Surgeon or Anesthesia Provider to do so.  2. No smoking on the day of surgery.  3. No alcohol 24 hours prior to the day of surgery.  4. No recreational drugs for one week prior to the day of surgery.  5. Leave [name], including money/purse, at home.  6. Remove [name], nail polish, makeup (including mascara); no lotions, powders, deodorant, or perfume/cologne/after shave.  7. Glasses/Contact lenses and Dentures may be worn to the hospital.  They will be removed prior to surgery.  8. Call your doctor if symptoms of a cold or illness develop within 24 ours prior to surgery.  9. AN ADULT MUST DRIVE YOU HOME AFTER OUTPATIENT SURGERY.   10. If you are having an OUTPATIENT procedure, please make arrangements for a responsible adult to be with you for 24 hours after your surgery.  11. If you are admitted to the hospital, you will be assigned to a bed after surgery is complete.  Normally a family member will not be able to see you until you are in your assigned bed.  52. Family is encouraged to accompany you to the hospital.  We ask visitors in the treatment area to be limited to ONE person at a time to ensure patient privacy.  EXCEPTIONS WILL BE MADE AS NEEDED.  51. Children under 12 are discouraged from entering the treatment area and need to be supervised by an adult when in the waiting room.    Special Instructions:    Take these medications the morning of surgery with a sip of water:  MORNING MEDS, Follow physician instructions about insulin.  If NO  instructions were given, take your usual dose of BASAL insulin the night BEFORE surgery.    Patient Prep:    shower with anti-bacterial soap    These surgical instructions were reviewed with PATIENT during the PAT VISIT.    A printed copy of the instructions was provided to PATIENT.    Directions:  On the morning of surgery, please go to the [facility].  Enter the building from the [employer] lot entrance, 1st floor (next to the Emergency Room entrance).  Take the elevator to the 2nd floor.  Sign in at the Registration Desk.    If you have any questions and/or concerns, please do not hesitate to call:  (Prior to the day of surgery)  [facility] unit:  [phone]  (Day of surgery)  [facility] unit:  [phone]

## 2017-07-09 ENCOUNTER — Inpatient Hospital Stay: Payer: PRIVATE HEALTH INSURANCE

## 2017-07-09 LAB — GLUCOSE, POC
Glucose (POC): 150 mg/dL — ABNORMAL HIGH (ref 70–110)
Glucose (POC): 99 mg/dL (ref 70–110)

## 2017-07-09 LAB — HCG URINE, QL. - POC: Pregnancy test,urine (POC): NEGATIVE

## 2017-07-09 MED ORDER — FENTANYL CITRATE (PF) 50 MCG/ML IJ SOLN
50 mcg/mL | INTRAMUSCULAR | Status: AC
Start: 2017-07-09 — End: ?

## 2017-07-09 MED ORDER — SODIUM CHLORIDE 0.9 % IRRIGATION SOLN
0.9 % | Status: DC | PRN
Start: 2017-07-09 — End: 2017-07-09
  Administered 2017-07-09: 18:00:00

## 2017-07-09 MED ORDER — LACTATED RINGERS IV
INTRAVENOUS | Status: DC
Start: 2017-07-09 — End: 2017-07-09

## 2017-07-09 MED ORDER — GLUCOSE 4 GRAM CHEWABLE TAB
4 gram | ORAL | Status: DC | PRN
Start: 2017-07-09 — End: 2017-07-09

## 2017-07-09 MED ORDER — ONDANSETRON (PF) 4 MG/2 ML INJECTION
4 mg/2 mL | INTRAMUSCULAR | Status: AC
Start: 2017-07-09 — End: ?

## 2017-07-09 MED ORDER — CEFAZOLIN 2 G IN 100 ML 0.9% NS
2 gram/100 mL | INTRAVENOUS | Status: DC | PRN
Start: 2017-07-09 — End: 2017-07-09
  Administered 2017-07-09: 19:00:00 via INTRAVENOUS

## 2017-07-09 MED ORDER — DEXAMETHASONE SODIUM PHOSPHATE 4 MG/ML IJ SOLN
4 mg/mL | INTRAMUSCULAR | Status: AC
Start: 2017-07-09 — End: ?

## 2017-07-09 MED ORDER — SODIUM CHLORIDE 0.9 % IJ SYRG
Freq: Three times a day (TID) | INTRAMUSCULAR | Status: DC
Start: 2017-07-09 — End: 2017-07-09

## 2017-07-09 MED ORDER — ALBUTEROL SULFATE 0.083 % (0.83 MG/ML) SOLN FOR INHALATION
2.5 mg /3 mL (0.083 %) | RESPIRATORY_TRACT | Status: DC | PRN
Start: 2017-07-09 — End: 2017-07-09

## 2017-07-09 MED ORDER — DEXAMETHASONE SODIUM PHOSPHATE 4 MG/ML IJ SOLN
4 mg/mL | INTRAMUSCULAR | Status: DC | PRN
Start: 2017-07-09 — End: 2017-07-09
  Administered 2017-07-09: 18:00:00 via INTRAVENOUS

## 2017-07-09 MED ORDER — CEFAZOLIN 1 GRAM SOLUTION FOR INJECTION
1 gram | INTRAMUSCULAR | Status: AC
Start: 2017-07-09 — End: ?

## 2017-07-09 MED ORDER — LACTATED RINGERS IV
INTRAVENOUS | Status: DC
Start: 2017-07-09 — End: 2017-07-09
  Administered 2017-07-09: 18:00:00 via INTRAVENOUS

## 2017-07-09 MED ORDER — MIDAZOLAM 1 MG/ML IJ SOLN
1 mg/mL | INTRAMUSCULAR | Status: AC
Start: 2017-07-09 — End: ?

## 2017-07-09 MED ORDER — PROPOFOL 10 MG/ML IV EMUL
10 mg/mL | INTRAVENOUS | Status: DC | PRN
Start: 2017-07-09 — End: 2017-07-09
  Administered 2017-07-09: 18:00:00 via INTRAVENOUS

## 2017-07-09 MED ORDER — PROPOFOL 10 MG/ML IV EMUL
10 mg/mL | INTRAVENOUS | Status: AC
Start: 2017-07-09 — End: ?

## 2017-07-09 MED ORDER — GLYCOPYRROLATE 0.2 MG/ML IJ SOLN
0.2 mg/mL | INTRAMUSCULAR | Status: AC
Start: 2017-07-09 — End: ?

## 2017-07-09 MED ORDER — OXYCODONE-ACETAMINOPHEN 5 MG-325 MG TAB
5-325 mg | ORAL_TABLET | ORAL | 0 refills | Status: DC | PRN
Start: 2017-07-09 — End: 2017-09-15

## 2017-07-09 MED ORDER — DEXTROSE 50% IN WATER (D50W) IV
INTRAVENOUS | Status: DC | PRN
Start: 2017-07-09 — End: 2017-07-09

## 2017-07-09 MED ORDER — INSULIN LISPRO 100 UNIT/ML INJECTION
100 unit/mL | SUBCUTANEOUS | Status: DC
Start: 2017-07-09 — End: 2017-07-09

## 2017-07-09 MED ORDER — GLUCAGON 1 MG INJECTION
1 mg | INTRAMUSCULAR | Status: DC | PRN
Start: 2017-07-09 — End: 2017-07-09

## 2017-07-09 MED ORDER — GLYCOPYRROLATE 0.2 MG/ML IJ SOLN
0.2 mg/mL | INTRAMUSCULAR | Status: DC | PRN
Start: 2017-07-09 — End: 2017-07-09
  Administered 2017-07-09: 18:00:00 via INTRAVENOUS

## 2017-07-09 MED ORDER — CEPHALEXIN 250 MG CAP
250 mg | ORAL_CAPSULE | Freq: Four times a day (QID) | ORAL | 2 refills | Status: DC
Start: 2017-07-09 — End: 2017-09-15

## 2017-07-09 MED ORDER — FENTANYL CITRATE (PF) 50 MCG/ML IJ SOLN
50 mcg/mL | INTRAMUSCULAR | Status: DC | PRN
Start: 2017-07-09 — End: 2017-07-09

## 2017-07-09 MED ORDER — MIDAZOLAM 1 MG/ML IJ SOLN
1 mg/mL | INTRAMUSCULAR | Status: DC | PRN
Start: 2017-07-09 — End: 2017-07-09
  Administered 2017-07-09: 18:00:00 via INTRAVENOUS

## 2017-07-09 MED ORDER — FENTANYL CITRATE (PF) 50 MCG/ML IJ SOLN
50 mcg/mL | INTRAMUSCULAR | Status: DC | PRN
Start: 2017-07-09 — End: 2017-07-09
  Administered 2017-07-09: 19:00:00 via INTRAVENOUS

## 2017-07-09 MED ORDER — ONDANSETRON (PF) 4 MG/2 ML INJECTION
4 mg/2 mL | INTRAMUSCULAR | Status: DC | PRN
Start: 2017-07-09 — End: 2017-07-09
  Administered 2017-07-09: 18:00:00 via INTRAVENOUS

## 2017-07-09 MED ORDER — SODIUM CHLORIDE 0.9 % IJ SYRG
INTRAMUSCULAR | Status: DC | PRN
Start: 2017-07-09 — End: 2017-07-09

## 2017-07-09 MED ORDER — INSULIN LISPRO 100 UNIT/ML INJECTION
100 unit/mL | Freq: Once | SUBCUTANEOUS | Status: AC
Start: 2017-07-09 — End: 2017-07-09
  Administered 2017-07-09: 18:00:00 via SUBCUTANEOUS

## 2017-07-09 MED ORDER — LIDOCAINE (PF) 20 MG/ML (2 %) IJ SOLN
20 mg/mL (2 %) | INTRAMUSCULAR | Status: DC | PRN
Start: 2017-07-09 — End: 2017-07-09
  Administered 2017-07-09: 18:00:00 via INTRAVENOUS

## 2017-07-09 MED ORDER — FENTANYL CITRATE (PF) 50 MCG/ML IJ SOLN
50 mcg/mL | INTRAMUSCULAR | Status: DC | PRN
Start: 2017-07-09 — End: 2017-07-09
  Administered 2017-07-09 (×2): via INTRAVENOUS

## 2017-07-09 MED ORDER — LIDOCAINE-EPINEPHRINE 0.5 %-1:200,000 IJ SOLN
0.5 %-1:200,000 | INTRAMUSCULAR | Status: AC
Start: 2017-07-09 — End: ?

## 2017-07-09 MED ORDER — INSULIN LISPRO 100 UNIT/ML INJECTION
100 unit/mL | Freq: Once | SUBCUTANEOUS | Status: DC
Start: 2017-07-09 — End: 2017-07-09

## 2017-07-09 MED FILL — FENTANYL CITRATE (PF) 50 MCG/ML IJ SOLN: 50 mcg/mL | INTRAMUSCULAR | Qty: 2

## 2017-07-09 MED FILL — CEFAZOLIN 2 G IN 100 ML 0.9% NS: 2 gram/100 mL | INTRAVENOUS | Qty: 100

## 2017-07-09 MED FILL — LACTATED RINGERS IV: INTRAVENOUS | Qty: 1000

## 2017-07-09 MED FILL — PROPOFOL 10 MG/ML IV EMUL: 10 mg/mL | INTRAVENOUS | Qty: 20

## 2017-07-09 MED FILL — DEXAMETHASONE SODIUM PHOSPHATE 4 MG/ML IJ SOLN: 4 mg/mL | INTRAMUSCULAR | Qty: 4

## 2017-07-09 MED FILL — CEFAZOLIN 1 GRAM SOLUTION FOR INJECTION: 1 gram | INTRAMUSCULAR | Qty: 2000

## 2017-07-09 MED FILL — GLYCOPYRROLATE 0.2 MG/ML IJ SOLN: 0.2 mg/mL | INTRAMUSCULAR | Qty: 0.2

## 2017-07-09 MED FILL — XYLOCAINE WITH EPINEPHRINE 0.5 %-1:200,000 INJECTION SOLUTION: 0.5 %-1:200,000 | INTRAMUSCULAR | Qty: 50

## 2017-07-09 MED FILL — ONDANSETRON (PF) 4 MG/2 ML INJECTION: 4 mg/2 mL | INTRAMUSCULAR | Qty: 2

## 2017-07-09 MED FILL — BD POSIFLUSH NORMAL SALINE 0.9 % INJECTION SYRINGE: INTRAMUSCULAR | Qty: 10

## 2017-07-09 MED FILL — MIDAZOLAM 1 MG/ML IJ SOLN: 1 mg/mL | INTRAMUSCULAR | Qty: 2

## 2017-07-09 MED FILL — GLYCOPYRROLATE 0.2 MG/ML IJ SOLN: 0.2 mg/mL | INTRAMUSCULAR | Qty: 2

## 2017-07-09 MED FILL — LIDOCAINE (PF) 20 MG/ML (2 %) IJ SOLN: 20 mg/mL (2 %) | INTRAMUSCULAR | Qty: 80

## 2017-07-09 MED FILL — INSULIN LISPRO 100 UNIT/ML INJECTION: 100 unit/mL | SUBCUTANEOUS | Qty: 1

## 2017-07-09 MED FILL — DEXAMETHASONE SODIUM PHOSPHATE 4 MG/ML IJ SOLN: 4 mg/mL | INTRAMUSCULAR | Qty: 1

## 2017-07-09 MED FILL — LACTATED RINGERS IV: INTRAVENOUS | Qty: 400

## 2017-07-09 MED FILL — PROPOFOL 10 MG/ML IV EMUL: 10 mg/mL | INTRAVENOUS | Qty: 200

## 2017-07-09 NOTE — Op Note (Signed)
[facility]  OPERATIVE REPORT    Name:[name], Barbara LYN  MR#: [id number]  DOB: 1966-04-04  ACCOUNT #: [id number]   DATE OF SERVICE: 07/09/2017    PREOPERATIVE DIAGNOSIS:  History of malignant neoplasm of left breast.     PREOPERATIVE DIAGNOSIS:  History of malignant neoplasm of left breast.     PROCEDURE PERFORMED:  Left breast necrotic wound excision measuring 21 x 3 cm, and removal of tissue expander with revision of closure.     SURGEON:  [name], MD    ASSISTANT:  Surgical assistant.     ANESTHESIA:  General.     ESTIMATED BLOOD LOSS:  Minimal.    SPECIMENS:    1.  Necrotic wound overlying left breast.  2.  Removal of left breast tissue expander device.    IMPLANTS: Tissue expander removed on the left side    FINDINGS:  Seroma around tissue expander, which was cultured.    COMPLICATIONS:  None.    DRAINS:  One #15 Blake drain.    PROCEDURE IN DETAIL:  The patient was placed on the operating table in the supine position.  Kendall stockings were inflated prior to induction of anesthesia.  The left breast was prepped and draped.  The necrotic areas in the left breast on the caudal side of the previous excision were excised in an elliptical fashion measuring 21 cm in horizontal directions, 3 cm in the vertical.  No local anesthetic was used, so the vascularity of the tissues could be carefully assessed.    Incision was made with a scalpel through the epidermis and dermis and thereafter, dissection was done with cutting electrocautery.  The left chest region immediately demonstrated a seroma, which was apparently clear, but was cultured for both aerobic and anaerobic organisms.  The remainder of the tissues deeply were thought to be viable.  There was sign overtly of infection, but the wound appeared to be too tight to be closed over the tissue expander.  The tissue expander was therefore removed.  Wound was thoroughly lavaged with diluted hydrogen peroxide and drained with a #15 Blake drain in  a layered fashion.  The breast wound was then closed using 3-0 Vicryl for the deep layer and interrupted and continuous 4-0 nylon for the superficial layer with both vertical mattress and simple stitches.  The wound was covered with bioclusive dressing.  The patient was returned to the recovery room in stable condition to be discharged home for outpatient basis.      [name], MD       GTT / LN  D: 07/13/2017 22:15     T: 07/13/2017 22:50  JOB #: [id number]

## 2017-07-09 NOTE — Other (Signed)
Anelis Hrivnak daughter is designated point of contact for medical [phone]

## 2017-07-09 NOTE — Anesthesia Pre-Procedure Evaluation (Signed)
Anesthetic History   No history of anesthetic complications            Review of Systems / Medical History  Patient summary reviewed and pertinent labs reviewed    Pulmonary          Smoker         Neuro/Psych   Within defined limits           Cardiovascular    Hypertension              Exercise tolerance: >4 METS     GI/Hepatic/Renal  Within defined limits              Endo/Other    Diabetes: type 2, using insulin         Other Findings   Comments: Documentation of current medication  Current medications obtained, documented and obtained? YES      Risk Factors for Postoperative nausea/vomiting:       History of postoperative nausea/vomiting?  NO       Female?  YES       Motion sickness?  NO       Intended opioid administration for postoperative analgesia?  YES      Smoking Abstinence:  Current Smoker?   YES  Elective Surgery? YES  Seen preoperatively by anesthesiologist or proxy prior to day of surgery?  YES  Pt abstained from smoking 24 hours prior to anesthesia? N/A    Preventive care/screening for High Blood Pressure:  Aged 18 years and older: YES  Screened for high blood pressure: YES  Patients with high blood pressure referred to primary care provider   for BP management: YES                 Physical Exam    Airway  Mallampati: II  TM Distance: 4 - 6 cm  Neck ROM: normal range of motion   Mouth opening: Normal     Cardiovascular  Regular rate and rhythm,  S1 and S2 normal,  no murmur, click, rub, or gallop  Rhythm: regular  Rate: normal         Dental    Dentition: Lower dentition intact and Upper dentition intact     Pulmonary  Breath sounds clear to auscultation               Abdominal  GI exam deferred       Other Findings            Anesthetic Plan    ASA: 3  Anesthesia type: general          Induction: Intravenous  Anesthetic plan and risks discussed with: Patient

## 2017-07-09 NOTE — Other (Signed)
Called daughter per pt request. Updated. Daughter stated she is on her way back.

## 2017-07-09 NOTE — H&P (Signed)
Date of Surgery Update:  Barbara [name] was seen and examined.  Patient 's history reviewed and patient re-examined without finding any changes..    Signed By: [name], MD     July 09, 2017 1:04 PM

## 2017-07-09 NOTE — Anesthesia Post-Procedure Evaluation (Signed)
Post-Anesthesia Evaluation and Assessment    Patient: Barbara [name] MRN: [id number]  SSN: [id number]    Date of Birth: 07/01/1966  Age: 51 y.o.  Sex: female       Cardiovascular Function/Vital Signs  Visit Vitals   ??? BP (!) 159/96   ??? Pulse 72   ??? Temp 36.4 ??C (97.5 ??F)   ??? Resp 11   ??? Ht 5\' 2"  (1.575 m)   ??? Wt 64.4 kg (142 lb)   ??? SpO2 98%   ??? BMI 25.97 kg/m2       Patient is status post general anesthesia for Procedure(s):  left breast  EXCISION and closure of wound.    Nausea/Vomiting: None    Postoperative hydration reviewed and adequate.    Pain:  Pain Scale 1: Numeric (0 - 10) (07/09/17 1530)  Pain Intensity 1: 5 (07/09/17 1530)   Managed    Neurological Status:   Neuro (WDL): Within Defined Limits (07/09/17 1530)   At baseline    Mental Status and Level of Consciousness: Arousable    Pulmonary Status:   O2 Device: Room air (07/09/17 1512)   Adequate oxygenation and airway patent    Complications related to anesthesia: None    Post-anesthesia assessment completed. No concerns    Signed By: Ronalee Belts, MD     July 09, 2017

## 2017-07-09 NOTE — Op Note (Signed)
Barbara [name]  OPERATIVE REPORT    Name:Barbara [name], Barbara [name]  MR#: [id number]  DOB: 1965-12-11  ACCOUNT #: [id number]   DATE OF SERVICE: 07/09/2017    PREOPERATIVE DIAGNOSIS:  History of malignant neoplasm of left breast.     PREOPERATIVE DIAGNOSIS:  History of malignant neoplasm of left breast.     PROCEDURE PERFORMED:  Left breast necrotic wound excision measuring 21 x 3 cm, and removal of tissue expander with revision of closure.     SURGEON:  [name], MD    ASSISTANT:  Surgical assistant.     ANESTHESIA:  General.     ESTIMATED BLOOD LOSS:  Minimal.    SPECIMENS:    1.  Necrotic wound overlying left breast.  2.  Removal of left breast tissue expander device.    IMPLANTS: Tissue expander removed on the left side    FINDINGS:  Seroma around tissue expander, which was cultured.    COMPLICATIONS:  None.    DRAINS:  One #15 Blake drain.    PROCEDURE IN DETAIL:  The patient was placed on the operating table in the supine position.  Kendall stockings were inflated prior to induction of anesthesia.  The left breast was prepped and draped.  The necrotic areas in the left breast on the caudal side of the previous excision were excised in an elliptical fashion measuring 21 cm in horizontal directions, 3 cm in the vertical.  No local anesthetic was used, so the vascularity of the tissues could be carefully assessed.    Incision was made with a scalpel through the epidermis and dermis and thereafter, dissection was done with cutting electrocautery.  The left chest region immediately demonstrated a seroma, which was apparently clear, but was cultured for both aerobic and anaerobic organisms.  The remainder of the tissues deeply were thought to be viable.  There was sign overtly of infection, but the wound appeared to be too tight to be closed over the tissue expander.  The tissue expander was therefore removed.  Wound was thoroughly lavaged with diluted hydrogen peroxide and drained  with a #15 Blake drain in a layered fashion.  The breast wound was then closed using 3-0 Vicryl for the deep layer and interrupted and continuous 4-0 nylon for the superficial layer with both vertical mattress and simple stitches.  The wound was covered with bioclusive dressing.  The patient was returned to the recovery room in stable condition to be discharged home for outpatient basis.      [name], MD       GTT / LN  D: 07/13/2017 22:15     T: 07/13/2017 22:50  JOB #: [id number]

## 2017-07-09 NOTE — Brief Op Note (Signed)
BRIEF OPERATIVE NOTE    Date of Procedure: 07/09/2017   Preoperative Diagnosis: z85.3; History of malignant neoplasm of left breast  Postoperative Diagnosis: z85.3; as above    Procedure(s):  Left breast - Necrotic wound excision [ 21 x 3 cm ],removal of tissue expander and closure of wound.    Surgeon(s) and Role:     * [name], MD - Primary         Surgical Assistant: Ms Madelon Lips    Surgical Staff:  Circ-1: Mickle Mallory, RN  Scrub Tech-1: Tammi Sou  Surg Asst-1: Britt Bottom  Event Time In   Incision Start 1404   Incision Close 1435     Anesthesia: General   Estimated Blood Loss: Minimal  Specimens:   ID Type Source Tests Collected by Time Destination   1 : necrotic wound overlying left breast Preservative Breast  [name], MD 07/09/2017 1434 Pathology   2 : removal of left breast device for identification Preservative Breast  [name], MD 07/09/2017 1456 Pathology      Findings: seroma around tissue expander  Complications: none  Drain :  1x #15 Keenan Bachelor

## 2017-07-09 NOTE — Other (Signed)
Phase 2 Recovery Summary  Patient arrived to Phase 2 at 1542  Report received from [place], [place]:    07/09/17 1505 07/09/17 1510 07/09/17 1536 07/09/17 1542   BP: 148/88 148/83 (!) 159/96 (!) 154/96   Pulse: 62 60 72 70   Resp: 8 10 11 12    Temp:       SpO2: 100% 100% 98% 98%   Weight:       Height:           oriented to time, place, person and situation    Lines and Drains  Peripheral Intravenous Line:      Wound  Wound Breast (Active)   Number of days:29       Wound Breast Left (Active)   Number of days:29       Wound Breast Left (Active)   DRESSING STATUS Clean, dry, and intact 07/09/2017  2:30 PM   DRESSING TYPE Sutures;Transparent film 07/09/2017  2:30 PM   Incision site well approximated? Yes 07/09/2017  2:30 PM   Number of days:0              Patient discharged to home with Daughter, Aldona Bar  at [place]

## 2017-07-10 ENCOUNTER — Encounter: Payer: PRIVATE HEALTH INSURANCE | Primary: Nurse Practitioner

## 2017-07-13 ENCOUNTER — Inpatient Hospital Stay: Admit: 2017-07-13 | Discharge: 2017-07-13 | Payer: PRIVATE HEALTH INSURANCE | Attending: Emergency Medicine
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
## 2017-07-13 NOTE — ED Triage Notes (Signed)
Patient states that she is having trouble breathing because she is having pain and it causes anxiety and it causes her to have trouble breathing when she is anxious. Last took ativan at 0800 07/12/17

## 2017-07-14 ENCOUNTER — Inpatient Hospital Stay: Payer: PRIVATE HEALTH INSURANCE | Primary: Nurse Practitioner

## 2017-07-14 LAB — CULTURE, BODY FLUID W GRAM STAIN
Culture result:: NO GROWTH
GRAM STAIN: NONE SEEN
GRAM STAIN: NONE SEEN

## 2017-07-14 LAB — CULTURE, ANAEROBIC

## 2017-07-15 NOTE — Telephone Encounter (Signed)
[facility] called to advise that the patient had not shown for her appointment. They attempted to contact the patient by telephone but were unable to. I informed Mayer Camel Dr. Evonnie Pat nurse of the information.

## 2017-07-16 ENCOUNTER — Encounter: Payer: PRIVATE HEALTH INSURANCE | Primary: Nurse Practitioner

## 2017-07-21 ENCOUNTER — Encounter: Payer: PRIVATE HEALTH INSURANCE | Primary: Nurse Practitioner

## 2017-07-21 ENCOUNTER — Ambulatory Visit
Admit: 2017-07-21 | Discharge: 2017-07-21 | Payer: PRIVATE HEALTH INSURANCE | Attending: Surgery | Primary: Nurse Practitioner

## 2017-07-21 DIAGNOSIS — Z170 Estrogen receptor positive status [ER+]: Secondary | ICD-10-CM

## 2017-07-21 NOTE — Telephone Encounter (Signed)
Spoke with Aldona Bar (Teaching laboratory technician) and Optometrist) at [facility] regarding Ms. Dearcos appointment that was scheduled on June 25, 2017 with Dr. Brent General however Ms. Holstein states she received a call the day prior to her appointment stating [facility] do not accept her insurance.  Per Aldona Bar and Rollene Fare Ms. [employer] plan requires a referral.  I informed them yes we're aware and a referral was obtain from her pcp and faxed to their office along with the rapid referral form.  Rollene Fare states it possibly may have been separated when the medical records were received. Rollene Fare apologized for the error and ask that I refax to her attention the rapid referral form along with a copy of Ms. [employer] and the [facility] referral we received from [employer].  [facility] scheduled Ms. Sholtz for July 29, 2017 at 1:00pm with Dr. Brent General.

## 2017-07-21 NOTE — Progress Notes (Signed)
Chief Complaint   Patient presents with   ??? Surgical Follow-up     Post op left breast mastectomy. Sentinel node biopsy with tissue expander 8.28.18.  Pain noted at a 3/10.     1. Have you been to the ER, urgent care clinic since your last visit?  Hospitalized since your last visit?No    2. Have you seen or consulted any other health care providers outside of the [facility] since your last visit?  Include any pap smears or colon screening. No
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
## 2017-07-21 NOTE — Telephone Encounter (Signed)
Spoke with Ms. Lawler to inform her appointment [place] [address] is scheduled on Tuesday, July 29, 2017 at 1:00pm.

## 2017-07-21 NOTE — Patient Instructions (Signed)
If you have any questions or concerns about today's appointment, the verbal and/or written instructions you were given for follow up care, please call our office at [phone].    [facility]  [address], [address]  [place] [address]    [phone] office  [phone]

## 2017-07-21 NOTE — Nurse Consult (Signed)
F/u ov with Dr Jennye Boroughs. Pt had tissue expander removed recently by Dr Ronnald Ramp. She has a f/u ov with him tomorrow. Doing well so far. F/u ov with Dr Jennye Boroughs in three months. All questions answered.

## 2017-07-23 ENCOUNTER — Encounter: Payer: PRIVATE HEALTH INSURANCE | Primary: Nurse Practitioner

## 2017-07-28 ENCOUNTER — Encounter: Payer: PRIVATE HEALTH INSURANCE | Primary: Nurse Practitioner

## 2017-07-30 ENCOUNTER — Encounter: Payer: PRIVATE HEALTH INSURANCE | Primary: Nurse Practitioner

## 2017-08-06 ENCOUNTER — Inpatient Hospital Stay: Admit: 2017-08-06 | Payer: PRIVATE HEALTH INSURANCE | Primary: Nurse Practitioner

## 2017-08-12 ENCOUNTER — Ambulatory Visit
Admit: 2017-08-12 | Discharge: 2017-08-12 | Payer: PRIVATE HEALTH INSURANCE | Attending: Internal Medicine | Primary: Nurse Practitioner

## 2017-08-12 DIAGNOSIS — E119 Type 2 diabetes mellitus without complications: Secondary | ICD-10-CM

## 2017-08-12 MED ORDER — LORAZEPAM 0.5 MG TAB
0.5 mg | ORAL_TABLET | Freq: Three times a day (TID) | ORAL | 0 refills | Status: DC | PRN
Start: 2017-08-12 — End: 2017-09-15

## 2017-08-12 MED ORDER — RAMIPRIL 10 MG CAP
10 mg | ORAL_CAPSULE | Freq: Every day | ORAL | 3 refills | Status: DC
Start: 2017-08-12 — End: 2017-12-02

## 2017-08-12 MED ORDER — NICOTINE 14 MG/24 HR DAILY PATCH
14 mg/24 hr | MEDICATED_PATCH | TRANSDERMAL | 1 refills | Status: AC
Start: 2017-08-12 — End: 2017-09-11

## 2017-08-12 MED ORDER — GABAPENTIN 300 MG CAP
300 mg | ORAL_CAPSULE | Freq: Three times a day (TID) | ORAL | 3 refills | Status: DC
Start: 2017-08-12 — End: 2017-10-27

## 2017-08-12 NOTE — Telephone Encounter (Signed)
Please advise.

## 2017-08-12 NOTE — Telephone Encounter (Signed)
Agree with not filling prescription.Will inform pharmacy.

## 2017-08-12 NOTE — Telephone Encounter (Signed)
Suber from [facility] stated that pt. Is recieving Norco and Percet from another provider. He also stated he will not fill prescription due to pt. Also being on ativan and gabapentin. Needs an ok from Dr. Murlean Hark. Please asisst.

## 2017-08-12 NOTE — Progress Notes (Signed)
1. Have you been to the ER, urgent care clinic since your last visit?  Hospitalized since your last visit?No    2. Have you seen or consulted any other health care providers outside of the [facility] since your last visit?  Include any pap smears or colon screening. No

## 2017-08-12 NOTE — Progress Notes (Signed)
Barbara [name] is a 51 y.o.  female and presents with     Chief Complaint   Patient presents with   ??? Anxiety   ??? Breast Cancer   ??? Hypertension   ??? Peripheral Neuropathy   ??? Back Pain   ??? Nicotine Dependence       Pt had mastectomy for left breast cancer.  She subsequently had reconstruction of her rt breast .  She says she has back pain and neuropathy in her feet.Pt says pain radiates from her back into legs  She gets anxiety and panic attacks.She smokes and wants to quit smoking.      Past Medical History:   Diagnosis Date   ??? Back pain    ??? Breast cancer ([place]) 2008 or 2009   ??? Diabetes ([place])    ??? FH: chemotherapy    ??? Radiation therapy complication 6644    NOT complication, just radiation     Past Surgical History:   Procedure Laterality Date   ??? BIOPSY BREAST     ??? BIOPSY FINE NEEDLE     ??? HX BREAST AUGMENTATION Right 2010   ??? HX BREAST RECONSTRUCTION Right 2011    per pt - attempted latissimus dorsi flap, then saline implant   ??? HX CESAREAN SECTION     ??? HX LUMBAR DISKECTOMY      herniated disc   ??? HX MASTECTOMY Right 2010    Total mastectomy per pt.     ??? HX MASTECTOMY Bilateral    ??? HX MASTOPEXY (BREAST LIFT) Left 2011   ??? IMPLANT BREAST SILICONE/EQ Right 0347    post mastectomy   ??? MULTIPLE DELIVERY C-SECTION      x2   ??? THUMB SUPPORT      thumb surgery to mobilize thumb     Current Outpatient Medications   Medication Sig   ??? LORazepam (ATIVAN) 0.5 mg tablet Take 1 Tab by mouth every eight (8) hours as needed for Anxiety. Max Daily Amount: 1.5 mg.   ??? ramipril (ALTACE) 10 mg capsule Take 1 Cap by mouth daily.   ??? gabapentin (NEURONTIN) 300 mg capsule Take 1 Cap by mouth three (3) times daily.   ??? nicotine (NICODERM CQ) 14 mg/24 hr patch 1 Patch by TransDERmal route every twenty-four (24) hours for 30 days.   ??? oxyCODONE-acetaminophen (PERCOCET) 5-325 mg per tablet Take 1 Tab by mouth every four (4) hours as needed for Pain. Max Daily Amount: 6 Tabs.    ??? cephALEXin (KEFLEX) 250 mg capsule Take 2 Caps by mouth four (4) times daily.   ??? oxyCODONE-acetaminophen (PERCOCET) 5-325 mg per tablet Take 1 Tab by mouth every four (4) hours as needed for Pain. Max Daily Amount: 6 Tabs.   ??? cephALEXin (KEFLEX) 250 mg capsule Take 2 Caps by mouth three (3) times daily.   ??? naloxone (NARCAN) 4 mg/actuation nasal spray Use 1 spray intranasally, then discard. Repeat with new spray every 2 min as needed for opioid overdose symptoms, alternating nostrils.   ??? insulin NPH/insulin regular (NOVOLIN 70/30, HUMULIN 70/30) 100 unit/mL (70-30) injection Administer 25 units subcut twice daily 30 minutes before breakfast and dinner   ??? cyclobenzaprine (FLEXERIL) 10 mg tablet Take 1 Tab by mouth two (2) times daily as needed for Muscle Spasm(s).     No current facility-administered medications for this visit.      Health Maintenance   Topic Date Due   ??? FOOT EXAM Q1  11/27/1977   ???  PAP AKA CERVICAL CYTOLOGY  11/27/1988   ??? Influenza Age 17 to Adult  05/14/2017   ??? MEDICARE YEARLY EXAM  08/11/2017   ??? HEMOGLOBIN A1C Q6M  11/15/2017   ??? MICROALBUMIN Q1  01/29/2018   ??? Pneumococcal 19-64 Highest Risk (2 of 3 - PCV13) 03/26/2018   ??? EYE EXAM RETINAL OR DILATED Q1  03/26/2018   ??? LIPID PANEL Q1  05/15/2018   ??? DTaP/Tdap/Td series (2 - Td) 02/04/2024     Immunization History   Administered Date(s) Administered   ??? Pneumococcal Polysaccharide (PPSV-23) 03/26/2017     No LMP recorded. Patient is premenopausal.        Allergies and Intolerances:   Allergies   Allergen Reactions   ??? Tramadol Anaphylaxis       Family History:   Family History   Problem Relation Age of Onset   ??? Diabetes Mother    ??? No Known Problems Father        Social History:   She  reports that she has been smoking.  She has been smoking about 0.50 packs per day. she has never used smokeless tobacco.  She  reports that she does not drink alcohol.            Review of Systems: pos for anxiety   General: negative for - chills, fatigue, fever, weight change  Psych: negative for - anxiety, depression, irritability or mood swings  ENT: negative for - headaches, hearing change, nasal congestion, oral lesions, sneezing or sore throat  Heme/ Lymph: negative for - bleeding problems, bruising, pallor or swollen lymph nodes  Endo: negative for - hot flashes, polydipsia/polyuria or temperature intolerance  Resp: negative for - cough, shortness of breath or wheezing  CV: negative for - chest pain, edema or palpitations  GI: negative for - abdominal pain, change in bowel habits, constipation, diarrhea or nausea/vomiting  GU: negative for - dysuria, hematuria, incontinence, pelvic pain or vulvar/vaginal symptoms  MSK: negative for - joint pain, joint swelling or muscle pain, pos for lower back pain  Neuro: negative for - confusion, headaches, seizures or weakness  Derm: negative for - dry skin, hair changes, rash or skin lesion changes          Physical:   Vitals:   Vitals:    08/12/17 1249   BP: 130/74   Pulse: 88   Resp: 15   Temp: 98.2 ??F (36.8 ??C)   TempSrc: Oral   SpO2: 97%   Weight: 138 lb (62.6 kg)   Height: 5\' 2"  (1.575 m)           Exam:   HEENT- atraumatic,normocephalic, awake, oriented, well nourished  Neck - supple,no enlarged lymph nodes, no JVD, no thyromegaly  Chest- CTA, no rhonchi, no crackles  Heart- rrr, no murmurs / gallop/rub  Abdomen- soft,BS+,NT, no hepatosplenomegaly  Ext - no c/c/edema   Neuro- no focal deficits.Power 5/5 all extremities  Skin - warm,dry, no obvious rashes.  Lower back tenderness ++          Review of Data:   LABS:   Lab Results   Component Value Date/Time    WBC 10.9 07/08/2017 04:12 PM    HGB 14.0 07/08/2017 04:12 PM    HCT 40.9 07/08/2017 04:12 PM    PLATELET 285 07/08/2017 04:12 PM     Lab Results   Component Value Date/Time    Sodium 142 07/08/2017 04:12 PM    Potassium 3.7 07/08/2017 04:12 PM  Chloride 104 07/08/2017 04:12 PM    CO2 29 07/08/2017 04:12 PM     Glucose 100 (H) 07/08/2017 04:12 PM    BUN 8 07/08/2017 04:12 PM    Creatinine 0.43 (L) 07/08/2017 04:12 PM     Lab Results   Component Value Date/Time    Cholesterol, total 178 05/15/2017 08:32 AM    HDL Cholesterol 42 05/15/2017 08:32 AM    LDL, calculated 106 (H) 05/15/2017 08:32 AM    Triglyceride 149 05/15/2017 08:32 AM     No results found for: GPT        Impression / Plan:        ICD-10-CM ICD-9-CM    1. DM type 2, goal HbA1c < 7% (HCC) E11.9 250.00 gabapentin (NEURONTIN) 300 mg capsule   2. Anxiety F41.9 300.00 LORazepam (ATIVAN) 0.5 mg tablet   3. Essential hypertension I10 401.9 ramipril (ALTACE) 10 mg capsule   4. Other chronic back pain M54.9 724.5 gabapentin (NEURONTIN) 300 mg capsule    G89.29 338.29    5. Smoker F17.200 305.1 nicotine (NICODERM CQ) 14 mg/24 hr patch   6. Bilateral low back pain with left-sided sciatica, unspecified chronicity M54.42 724.3 MRI LUMB SPINE WO CONT         Explained to patient risk benefits of the medications.Advised patient to stop meds if having any side effects.Pt verbalized understanding of the instructions.    I have discussed the diagnosis with the patient and the intended plan as seen in the above orders.  The patient has received an after-visit summary and questions were answered concerning future plans.  I have discussed medication side effects and warnings with the patient as well. I have reviewed the plan of care with the patient, accepted their input and they are in agreement with the treatment goals.     Reviewed plan of care. Patient has provided input and agrees with goals.    Follow-up Disposition:  Return in about 1 month (around 09/12/2017).    Elnora Morrison, MD

## 2017-08-13 LAB — ES ONCOTYPE DX BREAST RECURRENCE SCORE
ER Interpretation: POSITIVE
ER Score: 10.2
HER2 Interpretation: NEGATIVE
HER2 Score: 9.5
PR Interpretation: POSITIVE
PR Score: 8.4
Recurrence Score® Result: 13

## 2017-08-13 NOTE — Telephone Encounter (Signed)
Spoke with [facility] pharmacist who was not aware of suber msg but he will note patient acct and run pmp on patient before refilling any meds

## 2017-08-21 ENCOUNTER — Encounter

## 2017-08-21 ENCOUNTER — Inpatient Hospital Stay: Payer: PRIVATE HEALTH INSURANCE | Attending: Internal Medicine | Primary: Nurse Practitioner

## 2017-08-21 ENCOUNTER — Inpatient Hospital Stay: Admit: 2017-08-21 | Payer: PRIVATE HEALTH INSURANCE | Attending: Internal Medicine | Primary: Nurse Practitioner

## 2017-08-21 DIAGNOSIS — M5442 Lumbago with sciatica, left side: Secondary | ICD-10-CM

## 2017-08-21 DIAGNOSIS — R748 Abnormal levels of other serum enzymes: Secondary | ICD-10-CM

## 2017-08-21 NOTE — Telephone Encounter (Signed)
I have referred pt to Ortho.

## 2017-08-21 NOTE — Telephone Encounter (Signed)
Please advise.

## 2017-08-21 NOTE — Telephone Encounter (Signed)
Patient called stating she went to get her MRI done for her back but was unable to do so because she has "expanders"? In her back. She wants to know what to do now then. Please advise, thank you.

## 2017-08-22 NOTE — Telephone Encounter (Signed)
Patient notified. Pt verbalized understanding, this encounter will be closed.

## 2017-08-28 NOTE — Telephone Encounter (Signed)
I will be happy to refer the pt to surgery of pt thinks that the gallstones are causing abdominal pain.

## 2017-08-28 NOTE — Telephone Encounter (Signed)
Pt would like to know the results of the [place] she had on her stomach.Please assist

## 2017-08-28 NOTE — Telephone Encounter (Signed)
Please advise on Patient [place] ABD    1. Mild hepatic steatosis. No focal hepatic mass.   2. Moderate-sized layering gallstones with no secondary findings of  cholecystitis.

## 2017-09-03 NOTE — Telephone Encounter (Signed)
Spoke with patient, at this time she is not having problems. Pt would also like to go to any place for orthopedic. Pt is willing to travel as well. Nurse placed referral for [facility]. This encounter will be closed.

## 2017-09-09 ENCOUNTER — Encounter: Attending: Internal Medicine | Primary: Nurse Practitioner

## 2017-09-13 ENCOUNTER — Emergency Department: Admit: 2017-09-13 | Payer: PRIVATE HEALTH INSURANCE | Primary: Nurse Practitioner

## 2017-09-13 ENCOUNTER — Inpatient Hospital Stay
Admit: 2017-09-13 | Discharge: 2017-09-13 | Disposition: A | Discharge status: Home / Self Care | Payer: PRIVATE HEALTH INSURANCE | Attending: Emergency Medicine

## 2017-09-13 DIAGNOSIS — R0789 Other chest pain: Secondary | ICD-10-CM

## 2017-09-13 LAB — METABOLIC PANEL, COMPREHENSIVE
A-G Ratio: 1 (ref 0.8–1.7)
ALT (SGPT): 34 U/L (ref 13–56)
AST (SGOT): 28 U/L (ref 15–37)
Albumin: 3.6 g/dL (ref 3.4–5.0)
Alk. phosphatase: 172 U/L — ABNORMAL HIGH (ref 45–117)
Anion gap: 8 mmol/L (ref 3.0–18)
BUN/Creatinine ratio: 18 (ref 12–20)
BUN: 7 MG/DL (ref 7.0–18)
Bilirubin, total: 0.4 MG/DL (ref 0.2–1.0)
CO2: 23 mmol/L (ref 21–32)
Calcium: 8.6 MG/DL (ref 8.5–10.1)
Chloride: 110 mmol/L — ABNORMAL HIGH (ref 100–108)
Creatinine: 0.39 MG/DL — ABNORMAL LOW (ref 0.6–1.3)
GFR est AA: >60 mL/min/{1.73_m2} (ref >60)
GFR est non-AA: >60 mL/min/{1.73_m2} (ref >60)
Globulin: 3.6 g/dL (ref 2.0–4.0)
Glucose: 190 mg/dL — ABNORMAL HIGH (ref 74–99)
Potassium: 4 mmol/L (ref 3.5–5.5)
Protein, total: 7.2 g/dL (ref 6.4–8.2)
Sodium: 141 mmol/L (ref 136–145)

## 2017-09-13 LAB — CBC WITH AUTOMATED DIFF
ABS. BASOPHILS: 0.1 10*3/uL (ref 0.0–0.1)
ABS. EOSINOPHILS: 0.2 10*3/uL (ref 0.0–0.4)
ABS. LYMPHOCYTES: 4.2 10*3/uL — ABNORMAL HIGH (ref 0.9–3.6)
ABS. MONOCYTES: 0.8 10*3/uL (ref 0.05–1.2)
ABS. NEUTROPHILS: 6.5 10*3/uL (ref 1.8–8.0)
BASOPHILS: 1 % (ref 0–2)
EOSINOPHILS: 1 % (ref 0–5)
HCT: 50.5 % — ABNORMAL HIGH (ref 35.0–45.0)
HGB: 17.3 g/dL — ABNORMAL HIGH (ref 12.0–16.0)
LYMPHOCYTES: 36 % (ref 21–52)
MCH: 31.5 PG (ref 24.0–34.0)
MCHC: 34.3 g/dL (ref 31.0–37.0)
MCV: 91.8 FL (ref 74.0–97.0)
MONOCYTES: 7 % (ref 3–10)
MPV: 10.8 FL (ref 9.2–11.8)
NEUTROPHILS: 55 % (ref 40–73)
PLATELET: 228 10*3/uL (ref 135–420)
RBC: 5.5 M/uL — ABNORMAL HIGH (ref 4.20–5.30)
RDW: 12.6 % (ref 11.6–14.5)
WBC: 11.7 10*3/uL (ref 4.6–13.2)

## 2017-09-13 LAB — D DIMER: D DIMER: 0.49 ug/ml(FEU) — ABNORMAL HIGH (ref <0.46)

## 2017-09-13 LAB — TROPONIN I: Troponin-I, QT: <0.02 NG/ML (ref 0.0–0.045)

## 2017-09-13 LAB — D-DIMER, QUANTITATIVE: D-Dimer, Quant: 0.49 ug/ml(FEU) — ABNORMAL HIGH (ref <0.46)

## 2017-09-13 NOTE — ED Notes (Cosign Needed)
I performed a brief history of the patient here in triage and I have determined that pt will need further treatment and evaluation from the main side ER physician.  I have placed initial orders based on the history to help in expediting patients care.    Skeet Simmer, PA 11:49 AM

## 2017-09-13 NOTE — ED Triage Notes (Signed)
"  I've been having some sharp chest pain." Reports symptoms since Thursday night.

## 2017-09-13 NOTE — ED Provider Notes (Signed)
EMERGENCY DEPARTMENT HISTORY AND PHYSICAL EXAM    12:14 PM      Date: 09/13/2017  Patient Name: Barbara [name]    History of Presenting Illness     Chief Complaint   Patient presents with   ??? Chest Pain         History Provided By: Patient    Chief Complaint: Chest pain  Duration:  2 days ago  Timing:  Progressive  Location: Generalized  Quality: Sharp  Severity: Moderate  Modifying Factors: none reported  Associated Symptoms: denies any other associated signs or symptoms       12:15 PM Barbara [name] is a 51 y.o. female with h/o back pain, breast cancer and DM who presents to ED complaining of moderate, sharp, generalized chest pain onset 2 days ago. Pain lasts for 1 second. No alleviating or aggravating factors. Patient states she visited Dr. Ronnald Ramp today, then came to the ED. Denies having prior heart problems. Patient reports to having blood clots after breast cancer surgeries. Currently in remission. No family history of heart attacks.  Denies taking blood thinners and taking long trips. Pt admits to smoking 1/2 pack a day. No drug use. Patient states she attempted to move a heavy cabinet a few days ago. Noted expanders on the left side of her chest. Denies any further complaints or symptoms at the moment.       PCP: Elnora Morrison, MD        Current Outpatient Medications   Medication Sig Dispense Refill   ??? LORazepam (ATIVAN) 0.5 mg tablet Take 1 Tab by mouth every eight (8) hours as needed for Anxiety. Max Daily Amount: 1.5 mg. 30 Tab 0   ??? ramipril (ALTACE) 10 mg capsule Take 1 Cap by mouth daily. 30 Cap 3   ??? gabapentin (NEURONTIN) 300 mg capsule Take 1 Cap by mouth three (3) times daily. 90 Cap 3   ??? oxyCODONE-acetaminophen (PERCOCET) 5-325 mg per tablet Take 1 Tab by mouth every four (4) hours as needed for Pain. Max Daily Amount: 6 Tabs. 20 Tab 0   ??? cephALEXin (KEFLEX) 250 mg capsule Take 2 Caps by mouth four (4) times daily. 30 Cap 2    ??? oxyCODONE-acetaminophen (PERCOCET) 5-325 mg per tablet Take 1 Tab by mouth every four (4) hours as needed for Pain. Max Daily Amount: 6 Tabs. 30 Tab 0   ??? cephALEXin (KEFLEX) 250 mg capsule Take 2 Caps by mouth three (3) times daily. 30 Cap 2   ??? naloxone (NARCAN) 4 mg/actuation nasal spray Use 1 spray intranasally, then discard. Repeat with new spray every 2 min as needed for opioid overdose symptoms, alternating nostrils. 1 Each 2   ??? insulin NPH/insulin regular (NOVOLIN 70/30, HUMULIN 70/30) 100 unit/mL (70-30) injection Administer 25 units subcut twice daily 30 minutes before breakfast and dinner 20 mL 0   ??? cyclobenzaprine (FLEXERIL) 10 mg tablet Take 1 Tab by mouth two (2) times daily as needed for Muscle Spasm(s). 14 Tab 0       Past History     Past Medical History:  Past Medical History:   Diagnosis Date   ??? Back pain    ??? Breast cancer ([place]) 2008 or 2009   ??? Diabetes ([place])    ??? FH: chemotherapy    ??? Radiation therapy complication 2035    NOT complication, just radiation       Past Surgical History:  Past Surgical History:   Procedure Laterality Date   ???  BIOPSY BREAST     ??? BIOPSY FINE NEEDLE     ??? HX BREAST AUGMENTATION Right 2010   ??? HX BREAST RECONSTRUCTION Right 2011    per pt - attempted latissimus dorsi flap, then saline implant   ??? HX BREAST RECONSTRUCTION Bilateral 06/10/2017    right breast prosthesis and capsule removal and bilateral breast tissue  expander reconstruction.  performed by [name], MD at [facility]   ??? HX BREAST RECONSTRUCTION Left 07/09/2017    left breast  EXCISION and closure of wound performed by [name], MD at [place]   ??? HX CESAREAN SECTION     ??? HX LUMBAR DISKECTOMY      herniated disc   ??? HX MASTECTOMY Right 2010    Total mastectomy per pt.     ??? HX MASTECTOMY Left 06/10/2017    left mastectomy,Sentinal node biopsies. performed by [name], MD at [place]   ??? HX MASTECTOMY Bilateral    ??? HX MASTOPEXY (BREAST LIFT) Left 2011    ??? IMPLANT BREAST SILICONE/EQ Right 9371    post mastectomy   ??? MULTIPLE DELIVERY C-SECTION      x2   ??? THUMB SUPPORT      thumb surgery to mobilize thumb       Family History:  Family History   Problem Relation Age of Onset   ??? Diabetes Mother    ??? No Known Problems Father        Social History:  Social History     Tobacco Use   ??? Smoking status: Current Every Day Smoker     Packs/day: 0.50   ??? Smokeless tobacco: Never Used   Substance Use Topics   ??? Alcohol use: No   ??? Drug use: No       Allergies:  Allergies   Allergen Reactions   ??? Tramadol Anaphylaxis         Review of Systems       Review of Systems   Constitutional: Negative for chills and fever.   Respiratory: Negative for shortness of breath.    Cardiovascular: Positive for chest pain.   Gastrointestinal: Negative for abdominal pain, diarrhea, nausea and vomiting.   [name] other systems reviewed and are negative.        Physical Exam     Visit Vitals  BP 164/87 (BP 1 Location: Right arm, BP Patient Position: Sitting)   Pulse 71   Temp 98 ??F (36.7 ??C)   Resp 16   Ht 5' 2" (1.575 m)   Wt 61.7 kg (136 lb)   SpO2 100%   BMI 24.87 kg/m??       Physical Exam   Constitutional: She is oriented to person, place, and time. She appears well-developed and well-nourished.   HENT:   Head: Normocephalic and atraumatic.   Neck: Neck supple. No JVD present.   Cardiovascular: Normal rate and regular rhythm.   Pulmonary/Chest: Effort normal and breath sounds normal. No respiratory distress.   R breaast mound  L breast tissue surgically absent   Abdominal: Soft. She exhibits no distension. There is no tenderness. There is no rebound and no guarding.   Musculoskeletal: She exhibits no edema.   No joint tenderness  No calf tenderness   Neurological: She is alert and oriented to person, place, and time.   Skin: Skin is warm and dry. No erythema.   Psychiatric: Judgment normal.         Diagnostic  Study Results     Labs -  Recent Results (from the past 12 hour(s))    EKG, 12 LEAD, INITIAL    Collection Time: 09/13/17 11:43 AM   Result Value Ref Range    Ventricular Rate 0 BPM    Atrial Rate 0 BPM    QRS Duration 0 ms    Q-T Interval 0 ms    QTC Calculation (Bezet) 0 ms    Calculated R Axis 0 degrees    Calculated T Axis 0 degrees    Diagnosis       No QRS complexes found, no ECG analysis possible  When compared with ECG of 26-May-2017 11:15,  Current undetermined rhythm precludes rhythm comparison, needs review     CBC WITH AUTOMATED DIFF    Collection Time: 09/13/17 12:05 PM   Result Value Ref Range    WBC 11.7 4.6 - 13.2 K/uL    RBC 5.50 (H) 4.20 - 5.30 M/uL    HGB 17.3 (H) 12.0 - 16.0 g/dL    HCT 50.5 (H) 35.0 - 45.0 %    MCV 91.8 74.0 - 97.0 FL    MCH 31.5 24.0 - 34.0 PG    MCHC 34.3 31.0 - 37.0 g/dL    RDW 12.6 11.6 - 14.5 %    PLATELET 228 135 - 420 K/uL    MPV 10.8 9.2 - 11.8 FL    NEUTROPHILS 55 40 - 73 %    LYMPHOCYTES 36 21 - 52 %    MONOCYTES 7 3 - 10 %    EOSINOPHILS 1 0 - 5 %    BASOPHILS 1 0 - 2 %    ABS. NEUTROPHILS 6.5 1.8 - 8.0 K/UL    ABS. LYMPHOCYTES 4.2 (H) 0.9 - 3.6 K/UL    ABS. MONOCYTES 0.8 0.05 - 1.2 K/UL    ABS. EOSINOPHILS 0.2 0.0 - 0.4 K/UL    ABS. BASOPHILS 0.1 0.0 - 0.1 K/UL    DF AUTOMATED     METABOLIC PANEL, COMPREHENSIVE    Collection Time: 09/13/17 12:21 PM   Result Value Ref Range    Sodium 141 136 - 145 mmol/L    Potassium 4.0 3.5 - 5.5 mmol/L    Chloride 110 (H) 100 - 108 mmol/L    CO2 23 21 - 32 mmol/L    Anion gap 8 3.0 - 18 mmol/L    Glucose 190 (H) 74 - 99 mg/dL    BUN 7 7.0 - 18 MG/DL    Creatinine 0.39 (L) 0.6 - 1.3 MG/DL    BUN/Creatinine ratio 18 12 - 20      GFR est AA >60 >60 ml/min/1.93m    GFR est non-AA >60 >60 ml/min/1.737m   Calcium 8.6 8.5 - 10.1 MG/DL    Bilirubin, total 0.4 0.2 - 1.0 MG/DL    ALT (SGPT) 34 13 - 56 U/L    AST (SGOT) 28 15 - 37 U/L    Alk. phosphatase 172 (H) 45 - 117 U/L    Protein, total 7.2 6.4 - 8.2 g/dL    Albumin 3.6 3.4 - 5.0 g/dL    Globulin 3.6 2.0 - 4.0 g/dL    A-G Ratio 1.0 0.8 - 1.7      TROPONIN I    Collection Time: 09/13/17 12:21 PM   Result Value Ref Range    Troponin-I, Qt. <0.02 0.0 - 0.045 NG/ML   D DIMER    Collection Time: 09/13/17 12:21 PM   Result Value  Ref Range    D DIMER 0.49 (H) <0.46 ug/ml(FEU)       Radiologic Studies -   XR CHEST PA LAT    (Results Pending)     No acute process, noted R breast expander    Medical Decision Making   I am the first provider for this patient.    I reviewed the vital signs, available nursing notes, past medical history, past surgical history, family history and social history.    Vital Signs-Reviewed the patient's vital signs.    Pulse Oximetry Analysis -  100 on room air     Cardiac Monitor:  Rate: 71  Rhythm:  Normal Sinus Rhythm     EKG:Interpreted by the EP.   Time Interpreted:1145    Rate: 69   Rhythm: Normal Sinus Rhythm    Interpretation: normal axis, normal intervals, no ST changes   Comparison: prior 05/26/17, no change      Records Reviewed: Nursing Notes (Time of Review: 12:14 PM)    ED Course: Progress Notes, Reevaluation, and Consults:    Provider Notes (Medical Decision Making): 52 y/o female presents with chest pain, sharp, not pleuritic, does report prior dvt s/p surgery, otherwise low risk, not on estrogen, no recent travel, no sign of dvt, check d-dimer.   Also doubt acs, trop and ekg sufficient. No pain into back, not consistent with dissection.     Discussed results with pt, stable for dc home. Discussed return precautions if new or worsening sxs.     Diagnosis     Clinical Impression:   1. Chest wall pain      Disposition: discharged    Follow-up Information     Follow up With Specialties Details Why Contact Info    Leeroy Bock MD [facility]  Schedule an appointment as soon as possible for a visit in 2 days For follow up [address]  [place] [facility]    Elnora Morrison, MD Internal Medicine Schedule an appointment as soon as possible for a visit in 2 days For follow up [address]  [place] [address]  [phone]      [facility] EMERGENCY DEPT Emergency Medicine  As needed, If symptoms worsen [place]  [phone]              Medication List      ASK your doctor about these medications    * cephALEXin 250 mg capsule  Commonly known as:  KEFLEX  Take 2 Caps by mouth three (3) times daily.     * cephALEXin 250 mg capsule  Commonly known as:  KEFLEX  Take 2 Caps by mouth four (4) times daily.     cyclobenzaprine 10 mg tablet  Commonly known as:  FLEXERIL  Take 1 Tab by mouth two (2) times daily as needed for Muscle Spasm(s).     gabapentin 300 mg capsule  Commonly known as:  NEURONTIN  Take 1 Cap by mouth three (3) times daily.     insulin NPH/insulin regular 100 unit/mL (70-30) injection  Commonly known as:  NOVOLIN 70/30, HUMULIN 70/30  Administer 25 units subcut twice daily 30 minutes before breakfast and dinner     LORazepam 0.5 mg tablet  Commonly known as:  ATIVAN  Take 1 Tab by mouth every eight (8) hours as needed for Anxiety. Max Daily Amount: 1.5 mg.     naloxone 4 mg/actuation nasal spray  Commonly known as:  NARCAN  Use 1 spray intranasally, then discard. Repeat with new spray every 2 min as needed for opioid overdose symptoms, alternating nostrils.     * oxyCODONE-acetaminophen 5-325 mg per tablet  Commonly known as:  PERCOCET  Take 1 Tab by mouth every four (4) hours as needed for Pain. Max Daily Amount: 6 Tabs.     * oxyCODONE-acetaminophen 5-325 mg per tablet  Commonly known as:  PERCOCET  Take 1 Tab by mouth every four (4) hours as needed for Pain. Max Daily Amount: 6 Tabs.     ramipril 10 mg capsule  Commonly known as:  ALTACE  Take 1 Cap by mouth daily.         * This list has 4 medication(s) that are the same as other medications prescribed for you. Read the directions carefully, and ask your doctor or other care provider to review them with you.              _______________________________       Scribe Attestation      Russ Halo acting as a scribe for and in the presence of Lamonte Sakai, DO      September 13, 2017 at 12:14 PM       Provider Attestation:      I personally performed the services described in the documentation, reviewed the documentation, as recorded by the scribe in my presence, and it accurately and completely records my words and actions. September 13, 2017 at 12:14 PM - Lamonte Sakai, DO        _______________________________

## 2017-09-14 LAB — EKG, 12 LEAD, INITIAL
Atrial Rate: 69 {beats}/min
Calculated P Axis: 39 degrees
Calculated R Axis: 41 degrees
Calculated T Axis: 51 degrees
Diagnosis: NORMAL
P-R Interval: 136 ms
Q-T Interval: 426 ms
QRS Duration: 94 ms
QTC Calculation (Bezet): 456 ms
Ventricular Rate: 69 {beats}/min

## 2017-09-14 LAB — EKG 12-LEAD
Atrial Rate: 69 {beats}/min
Diagnosis: NORMAL
P Axis: 39 degrees
P-R Interval: 136 ms
Q-T Interval: 426 ms
QRS Duration: 94 ms
QTc Calculation (Bazett): 456 ms
R Axis: 41 degrees
T Axis: 51 degrees
Ventricular Rate: 69 {beats}/min

## 2017-09-15 NOTE — Other (Signed)
PAT - SURGICAL PRE-ADMISSION INSTRUCTIONS    NAME:  Barbara [name]                                                          TODAY'S DATE:  09/15/2017    SURGERY DATE:  09/17/2017                                  SURGERY ARRIVAL TIME:   TBD    1. Do NOT eat or drink anything, including candy or gum, after MIDNIGHT on 09/16/17 , unless you have specific instructions from your Surgeon or Anesthesia Provider to do so.  2. No smoking on the day of surgery.  3. No alcohol 24 hours prior to the day of surgery.  4. No recreational drugs for one week prior to the day of surgery.  5. Leave [name], including money/purse, at home.  6. Remove [name], nail polish, makeup (including mascara); no lotions, powders, deodorant, or perfume/cologne/after shave.  7. Glasses/Contact lenses and Dentures may be worn to the hospital.  They will be removed prior to surgery.  8. Call your doctor if symptoms of a cold or illness develop within 24 ours prior to surgery.  9. AN ADULT MUST DRIVE YOU HOME AFTER OUTPATIENT SURGERY.   10. If you are having an OUTPATIENT procedure, please make arrangements for a responsible adult to be with you for 24 hours after your surgery.  11. If you are admitted to the hospital, you will be assigned to a bed after surgery is complete.  Normally a family member will not be able to see you until you are in your assigned bed.  72. Family is encouraged to accompany you to the hospital.  We ask visitors in the treatment area to be limited to ONE person at a time to ensure patient privacy.  EXCEPTIONS WILL BE MADE AS NEEDED.  87. Children under 12 are discouraged from entering the treatment area and need to be supervised by an adult when in the waiting room.    Special Instructions:    Take these medications the morning of surgery with a sip of water:  NONE, Follow physician instructions about insulin.  If NO instructions were given, take your usual dose of BASAL insulin the night BEFORE surgery.     Patient Prep:    shower with anti-bacterial soap    These surgical instructions were reviewed with [facility] during the PAT phone call.     Directions:  On the morning of surgery, please go to the [facility].  Enter the building from the [employer] lot entrance, 1st floor (next to the Emergency Room entrance).  Take the elevator to the 2nd floor.  Sign in at the Registration Desk.    If you have any questions and/or concerns, please do not hesitate to call:  (Prior to the day of surgery)  [facility] unit:  [phone]  (Day of surgery)  [facility] unit:  [phone]

## 2017-09-17 ENCOUNTER — Inpatient Hospital Stay: Payer: PRIVATE HEALTH INSURANCE

## 2017-09-17 LAB — HCG URINE, QL. - POC: Pregnancy test,urine (POC): NEGATIVE

## 2017-09-17 LAB — GLUCOSE, POC
Glucose (POC): 143 mg/dL — ABNORMAL HIGH (ref 70–110)
Glucose (POC): 202 mg/dL — ABNORMAL HIGH (ref 70–110)

## 2017-09-17 MED ORDER — PROPOFOL 10 MG/ML IV EMUL
10 mg/mL | INTRAVENOUS | Status: DC | PRN
Start: 2017-09-17 — End: 2017-09-17
  Administered 2017-09-17 (×2): via INTRAVENOUS

## 2017-09-17 MED ORDER — ONDANSETRON (PF) 4 MG/2 ML INJECTION
4 mg/2 mL | INTRAMUSCULAR | Status: DC | PRN
Start: 2017-09-17 — End: 2017-09-17
  Administered 2017-09-17: 14:00:00 via INTRAVENOUS

## 2017-09-17 MED ORDER — HYDROMORPHONE (PF) 1 MG/ML IJ SOLN
1 mg/mL | INTRAMUSCULAR | Status: AC
Start: 2017-09-17 — End: ?

## 2017-09-17 MED ORDER — GLUCAGON 1 MG INJECTION
1 mg | INTRAMUSCULAR | Status: DC | PRN
Start: 2017-09-17 — End: 2017-09-17

## 2017-09-17 MED ORDER — LIDOCAINE 4 % TOPICAL SOLN
4 % | LARYNGEAL | Status: AC
Start: 2017-09-17 — End: ?

## 2017-09-17 MED ORDER — SUCCINYLCHOLINE CHLORIDE 20 MG/ML INJECTION
20 mg/mL | INTRAMUSCULAR | Status: DC | PRN
Start: 2017-09-17 — End: 2017-09-17
  Administered 2017-09-17: 13:00:00 via INTRAVENOUS

## 2017-09-17 MED ORDER — DEXAMETHASONE SODIUM PHOSPHATE 4 MG/ML IJ SOLN
4 mg/mL | INTRAMUSCULAR | Status: AC
Start: 2017-09-17 — End: ?

## 2017-09-17 MED ORDER — LIDOCAINE-EPINEPHRINE 1 %-1:100,000 IJ SOLN
1 %-:00,000 | INTRAMUSCULAR | Status: DC | PRN
Start: 2017-09-17 — End: 2017-09-17
  Administered 2017-09-17: 13:00:00 via INTRADERMAL

## 2017-09-17 MED ORDER — VECURONIUM BROMIDE 10 MG IV SOLR
10 mg | INTRAVENOUS | Status: AC
Start: 2017-09-17 — End: ?

## 2017-09-17 MED ORDER — MIDAZOLAM 1 MG/ML IJ SOLN
1 mg/mL | INTRAMUSCULAR | Status: AC
Start: 2017-09-17 — End: ?

## 2017-09-17 MED ORDER — SUCCINYLCHOLINE CHLORIDE 100 MG/5 ML (20 MG/ML) IV SYRINGE
100 mg/5 mL (20 mg/mL) | INTRAVENOUS | Status: AC
Start: 2017-09-17 — End: ?

## 2017-09-17 MED ORDER — LABETALOL 5 MG/ML IV SOLN
5 mg/mL | Freq: Once | INTRAVENOUS | Status: AC
Start: 2017-09-17 — End: 2017-09-17

## 2017-09-17 MED ORDER — LIDOCAINE-EPINEPHRINE 1 %-1:100,000 IJ SOLN
1 %-:00,000 | INTRAMUSCULAR | Status: AC
Start: 2017-09-17 — End: ?

## 2017-09-17 MED ORDER — LABETALOL 5 MG/ML IV SYRINGE
20 mg/4 mL (5 mg/mL) | INTRAVENOUS | Status: AC
Start: 2017-09-17 — End: 2017-09-17
  Administered 2017-09-17: 17:00:00 via INTRAVENOUS

## 2017-09-17 MED ORDER — LACTATED RINGERS IV
INTRAVENOUS | Status: DC
Start: 2017-09-17 — End: 2017-09-17
  Administered 2017-09-17: 12:00:00 via INTRAVENOUS

## 2017-09-17 MED ORDER — LIDOCAINE (PF) 20 MG/ML (2 %) IJ SOLN
20 mg/mL (2 %) | INTRAMUSCULAR | Status: AC
Start: 2017-09-17 — End: ?

## 2017-09-17 MED ORDER — HYDRALAZINE 20 MG/ML IJ SOLN
20 mg/mL | INTRAMUSCULAR | Status: DC | PRN
Start: 2017-09-17 — End: 2017-09-17
  Administered 2017-09-17 (×2): via INTRAVENOUS

## 2017-09-17 MED ORDER — PHENYLEPHRINE 1 MG/10 ML (100 MCG/ML) IN NS IV SYRINGE
1 mg/0 mL (00 mcg/mL) | INTRAVENOUS | Status: AC
Start: 2017-09-17 — End: ?

## 2017-09-17 MED ORDER — LIDOCAINE-EPINEPHRINE 0.5 %-1:200,000 IJ SOLN
0.5 %-1:200,000 | Freq: Once | INTRAMUSCULAR | Status: DC
Start: 2017-09-17 — End: 2017-09-17

## 2017-09-17 MED ORDER — MIDAZOLAM 1 MG/ML IJ SOLN
1 mg/mL | INTRAMUSCULAR | Status: DC | PRN
Start: 2017-09-17 — End: 2017-09-17
  Administered 2017-09-17: 13:00:00 via INTRAVENOUS

## 2017-09-17 MED ORDER — PROPOFOL 10 MG/ML IV EMUL
10 mg/mL | INTRAVENOUS | Status: AC
Start: 2017-09-17 — End: ?

## 2017-09-17 MED ORDER — FENTANYL CITRATE (PF) 50 MCG/ML IJ SOLN
50 mcg/mL | INTRAMUSCULAR | Status: AC
Start: 2017-09-17 — End: ?

## 2017-09-17 MED ORDER — HYDROMORPHONE 2 MG/ML INJECTION SOLUTION
2 mg/mL | INTRAMUSCULAR | Status: DC | PRN
Start: 2017-09-17 — End: 2017-09-17
  Administered 2017-09-17 (×3): via INTRAVENOUS

## 2017-09-17 MED ORDER — ONDANSETRON (PF) 4 MG/2 ML INJECTION
4 mg/2 mL | INTRAMUSCULAR | Status: AC
Start: 2017-09-17 — End: ?

## 2017-09-17 MED ORDER — HYDROMORPHONE (PF) 1 MG/ML IJ SOLN
1 mg/mL | INTRAMUSCULAR | Status: DC | PRN
Start: 2017-09-17 — End: 2017-09-17
  Administered 2017-09-17 (×2): via INTRAVENOUS

## 2017-09-17 MED ORDER — WATER FOR INJECTION, STERILE INJECTION
INTRAMUSCULAR | Status: AC
Start: 2017-09-17 — End: ?

## 2017-09-17 MED ORDER — EPHEDRINE SULFATE 50 MG/ML INJECTION SOLUTION
50 mg/mL | INTRAMUSCULAR | Status: AC
Start: 2017-09-17 — End: ?

## 2017-09-17 MED ORDER — FENTANYL CITRATE (PF) 50 MCG/ML IJ SOLN
50 mcg/mL | INTRAMUSCULAR | Status: DC | PRN
Start: 2017-09-17 — End: 2017-09-17
  Administered 2017-09-17 (×2): via INTRAVENOUS

## 2017-09-17 MED ORDER — FAMOTIDINE 20 MG TAB
20 mg | Freq: Once | ORAL | Status: AC
Start: 2017-09-17 — End: 2017-09-17
  Administered 2017-09-17: 12:00:00 via ORAL

## 2017-09-17 MED ORDER — HYDRALAZINE 20 MG/ML IJ SOLN
20 mg/mL | INTRAMUSCULAR | Status: AC
Start: 2017-09-17 — End: ?

## 2017-09-17 MED ORDER — INSULIN LISPRO 100 UNIT/ML INJECTION
100 unit/mL | Freq: Once | SUBCUTANEOUS | Status: DC
Start: 2017-09-17 — End: 2017-09-17

## 2017-09-17 MED ORDER — LACTATED RINGERS IV
INTRAVENOUS | Status: DC
Start: 2017-09-17 — End: 2017-09-17

## 2017-09-17 MED ORDER — LIDOCAINE (PF) 20 MG/ML (2 %) IJ SOLN
20 mg/mL (2 %) | INTRAMUSCULAR | Status: DC | PRN
Start: 2017-09-17 — End: 2017-09-17
  Administered 2017-09-17: 13:00:00 via INTRAVENOUS

## 2017-09-17 MED ORDER — GLUCOSE 4 GRAM CHEWABLE TAB
4 gram | ORAL | Status: DC | PRN
Start: 2017-09-17 — End: 2017-09-17

## 2017-09-17 MED ORDER — DEXTROSE 50% IN WATER (D50W) IV
INTRAVENOUS | Status: DC | PRN
Start: 2017-09-17 — End: 2017-09-17

## 2017-09-17 MED ORDER — CEFAZOLIN 2 GM/50 ML IN DEXTROSE (ISO-OSMOTIC) IVPB
2 gram/50 mL | INTRAVENOUS | Status: AC
Start: 2017-09-17 — End: 2017-09-17
  Administered 2017-09-17: 13:00:00 via INTRAVENOUS

## 2017-09-17 MED ORDER — OXYCODONE-ACETAMINOPHEN 5 MG-325 MG TAB
5-325 mg | ORAL | Status: DC | PRN
Start: 2017-09-17 — End: 2017-09-17
  Administered 2017-09-17: 17:00:00 via ORAL

## 2017-09-17 MED ORDER — INSULIN LISPRO 100 UNIT/ML INJECTION
100 unit/mL | Freq: Once | SUBCUTANEOUS | Status: AC
Start: 2017-09-17 — End: 2017-09-17

## 2017-09-17 MED ORDER — ONDANSETRON (PF) 4 MG/2 ML INJECTION
4 mg/2 mL | Freq: Once | INTRAMUSCULAR | Status: DC
Start: 2017-09-17 — End: 2017-09-17

## 2017-09-17 MED ORDER — HYDROGEN PEROXIDE 3 % SOLN
3 % | Status: DC | PRN
Start: 2017-09-17 — End: 2017-09-17
  Administered 2017-09-17: 13:00:00

## 2017-09-17 MED ORDER — FENTANYL CITRATE (PF) 50 MCG/ML IJ SOLN
50 mcg/mL | INTRAMUSCULAR | Status: DC | PRN
Start: 2017-09-17 — End: 2017-09-17

## 2017-09-17 MED ORDER — INSULIN LISPRO 100 UNIT/ML INJECTION
100 unit/mL | SUBCUTANEOUS | Status: AC
Start: 2017-09-17 — End: 2017-09-17
  Administered 2017-09-17: 15:00:00 via SUBCUTANEOUS

## 2017-09-17 MED FILL — LIDOCAINE-EPINEPHRINE 1 %-1:100,000 IJ SOLN: 1 %-:00,000 | INTRAMUSCULAR | Qty: 40

## 2017-09-17 MED FILL — INSULIN LISPRO 100 UNIT/ML INJECTION: 100 unit/mL | SUBCUTANEOUS | Qty: 1

## 2017-09-17 MED FILL — PROPOFOL 10 MG/ML IV EMUL: 10 mg/mL | INTRAVENOUS | Qty: 40

## 2017-09-17 MED FILL — EPHEDRINE SULFATE 50 MG/ML IJ SOLN: 50 mg/mL | INTRAMUSCULAR | Qty: 1

## 2017-09-17 MED FILL — LACTATED RINGERS IV: INTRAVENOUS | Qty: 1000

## 2017-09-17 MED FILL — HYDROMORPHONE (PF) 1 MG/ML IJ SOLN: 1 mg/mL | INTRAMUSCULAR | Qty: 1

## 2017-09-17 MED FILL — FENTANYL CITRATE (PF) 50 MCG/ML IJ SOLN: 50 mcg/mL | INTRAMUSCULAR | Qty: 2

## 2017-09-17 MED FILL — SUCCINYLCHOLINE CHLORIDE 100 MG/5 ML (20 MG/ML) IV SYRINGE: 100 mg/5 mL (20 mg/mL) | INTRAVENOUS | Qty: 5

## 2017-09-17 MED FILL — XYLOCAINE-MPF 20 MG/ML (2 %) INJECTION SOLUTION: 20 mg/mL (2 %) | INTRAMUSCULAR | Qty: 5

## 2017-09-17 MED FILL — FAMOTIDINE 20 MG TAB: 20 mg | ORAL | Qty: 1

## 2017-09-17 MED FILL — VECURONIUM BROMIDE 10 MG IV SOLR: 10 mg | INTRAVENOUS | Qty: 10

## 2017-09-17 MED FILL — OXYCODONE-ACETAMINOPHEN 5 MG-325 MG TAB: 5-325 mg | ORAL | Qty: 1

## 2017-09-17 MED FILL — LIDOCAINE 4 % TOPICAL SOLN: 4 % | LARYNGEAL | Qty: 4

## 2017-09-17 MED FILL — XYLOCAINE WITH EPINEPHRINE 0.5 %-1:200,000 INJECTION SOLUTION: 0.5 %-1:200,000 | INTRAMUSCULAR | Qty: 50

## 2017-09-17 MED FILL — HYDRALAZINE 20 MG/ML IJ SOLN: 20 mg/mL | INTRAMUSCULAR | Qty: 1

## 2017-09-17 MED FILL — ONDANSETRON (PF) 4 MG/2 ML INJECTION: 4 mg/2 mL | INTRAMUSCULAR | Qty: 2

## 2017-09-17 MED FILL — LABETALOL 5 MG/ML IV SYRINGE: 20 mg/4 mL (5 mg/mL) | INTRAVENOUS | Qty: 4

## 2017-09-17 MED FILL — PHENYLEPHRINE 1 MG/10 ML (100 MCG/ML) IN NS IV SYRINGE: 1 mg/0 mL (00 mcg/mL) | INTRAVENOUS | Qty: 10

## 2017-09-17 MED FILL — HYDROMORPHONE 2 MG/ML INJECTION SOLUTION: 2 mg/mL | INTRAMUSCULAR | Qty: 1

## 2017-09-17 MED FILL — GLUCOSE 4 GRAM CHEWABLE TAB: 4 gram | ORAL | Qty: 4

## 2017-09-17 MED FILL — XYLOCAINE WITH EPINEPHRINE 0.5 %-1:200,000 INJECTION SOLUTION: 0.5 %-1:200,000 | INTRAMUSCULAR | Qty: 100

## 2017-09-17 MED FILL — DEXAMETHASONE SODIUM PHOSPHATE 4 MG/ML IJ SOLN: 4 mg/mL | INTRAMUSCULAR | Qty: 1

## 2017-09-17 MED FILL — CEFAZOLIN 2 GM/50 ML IN DEXTROSE (ISO-OSMOTIC) IVPB: 2 gram/50 mL | INTRAVENOUS | Qty: 50

## 2017-09-17 MED FILL — MIDAZOLAM 1 MG/ML IJ SOLN: 1 mg/mL | INTRAMUSCULAR | Qty: 2

## 2017-09-17 MED FILL — WATER FOR INJECTION, STERILE INJECTION: INTRAMUSCULAR | Qty: 20

## 2017-09-17 NOTE — Op Note (Signed)
[place]  OPERATIVE REPORT    Name:Whisman, [facility] LYN  MR#: [id number]  DOB: 07/14/1966  ACCOUNT #: [id number]   DATE OF SERVICE: 09/17/2017    PREOPERATIVE DIAGNOSIS:  History of breast cancer with previous removal of left breast tissue expander.    POSTOPERATIVE DIAGNOSIS:   History of breast cancer with previous removal of left breast tissue expander.    PROCEDURE PERFORMED:  Left breast reconstruction with exploration and submuscular introduction of left breast tissue expander (a 500 mL tissue expander filled with 50 mL of normal saline).    SURGEON:  [name], MD     ASSISTANTS:  Maeola Sarah and Madelon Lips, SAs.    ANESTHESIA:  General.    ESTIMATED BLOOD LOSS:  Minimal.    SPECIMENS REMOVED:  Left breast lateral scar, previous history of breast cancer.    FINDINGS:  Good submuscular space.    COMPLICATIONS:  None.    IMPLANTS:  A 500 mL Natrelle tissue expander made by [employer].     PROCEDURE IN DETAIL:  The patient had been previously marked and the inframammary line established as on the left side and the right side.  The previous diagonal scar which had healed well on the left side was reopened, excising a small 1-inch segment at the lateral pole which was sent for pathology to examine the skin cells to extrude any possible recurrence.  The area was infiltrated with 1% Xylocaine with 1:100,000 epinephrine.      When good vasoconstriction had been achieved, the incision was made through the previous scar excising a small ellipse laterally which was sent to Pathology for histological examination.  Dissection was taken down to the level of the pectoralis muscle and extended cranially for 3 cm but only 1 cm inferiorly because of the previous skin necrosis.  There were signs of partial fat necrosis inferiorly which was to be expected.  The muscle was divided at its lateral one-third along with the muscle fibers and then the dissection was taken on medially, laterally, and inferiorly to lower the  level of the breast to be constructed.  The space easily accommodated the 500 mL [employer] tissue expander and was drained with a #15 Blake drain taken out laterally and secured with a 3-0 silk suture.  The wound was then closed in 3 layers without any sutures in the muscle itself.  Hemostasis was meticulous and repeated with lavage with dilute hydrogen peroxide.  No suture was used to correct rotational change because the approximation of the space and the tissue expander was so perfect.  The deep subcutaneous tissue was closed with interrupted buried 3-0 Vicryl sutures.  The dermis was closed with 2 layers of 3-0 and 4-0 PDS interrupted dermal sutures and finally a 5-0 PDS suture for a continuous subdermal closure with knots buried away from the apices.  The patient was returned to the recovery room in a stable condition to be discharged home and followed on an outpatient basis.  The drain was secured with a bioclusive dressing.  This was also used to isolate the wound from the outside environment.      [name], MD       GTT / LN  D: 09/17/2017 17:28     T: 09/18/2017 10:46  JOB #: [id number]  CC: [name] MD

## 2017-09-17 NOTE — Other (Signed)
Barbara [name] (in [place]) [phone]

## 2017-09-17 NOTE — Progress Notes (Signed)
Chaplain conducted a pre-surgery visit with Barbara [name], who is a 51 y.o.,female.     The Chaplain provided the following Interventions:  Initiated a relationship of care and support.   Offered prayer and assurance of continued prayers on patient's behalf.     Plan:  Chaplains will continue to follow and will provide pastoral care on an as needed/requested basis.  Chaplain recommends bedside caregivers page chaplain on duty if patient shows signs of acute spiritual or emotional distress.    [facility]   Staff [place]   [phone]

## 2017-09-17 NOTE — Anesthesia Post-Procedure Evaluation (Signed)
Procedure(s):  left breast reconstruction with exchange of tissue expander per breast prostesis and left breast tissue expander.    Anesthesia Post Evaluation      Multimodal analgesia: multimodal analgesia used between 6 hours prior to anesthesia start to PACU discharge  Patient location during evaluation: bedside  Patient participation: complete - patient participated  Level of consciousness: awake  Pain management: adequate  Airway patency: patent  Anesthetic complications: no  Cardiovascular status: stable  Respiratory status: acceptable  Hydration status: acceptable  Post anesthesia nausea and vomiting:  controlled      Visit Vitals  BP 168/77 (BP 1 Location: Left leg, BP Patient Position: At rest)   Pulse 96   Temp 36.3 ??C (97.4 ??F)   Resp 18   Ht 5\' 2"  (1.575 m)   Wt 62.8 kg (138 lb 6 oz)   SpO2 95%   BMI 25.31 kg/m??

## 2017-09-17 NOTE — Op Note (Signed)
[place]  OPERATIVE REPORT    Name:Barbara [name], [facility] LYN  MR#: [id number]  DOB: Feb 25, 1966  ACCOUNT #: [id number]   DATE OF SERVICE: 09/17/2017    PREOPERATIVE DIAGNOSIS:  History of breast cancer with previous removal of left breast tissue expander.    POSTOPERATIVE DIAGNOSIS:   History of breast cancer with previous removal of left breast tissue expander.    PROCEDURE PERFORMED:  Left breast reconstruction with exploration and submuscular introduction of left breast tissue expander (a 500 mL tissue expander filled with 50 mL of normal saline).    SURGEON:  [name], MD     ASSISTANTS:  Maeola Sarah and Madelon Lips, SAs.    ANESTHESIA:  General.    ESTIMATED BLOOD LOSS:  Minimal.    SPECIMENS REMOVED:  Left breast lateral scar, previous history of breast cancer.    FINDINGS:  Good submuscular space.    COMPLICATIONS:  None.    IMPLANTS:  A 500 mL Natrelle tissue expander made by [employer].     PROCEDURE IN DETAIL:  The patient had been previously marked and the inframammary line established as on the left side and the right side.  The previous diagonal scar which had healed well on the left side was reopened, excising a small 1-inch segment at the lateral pole which was sent for pathology to examine the skin cells to extrude any possible recurrence.  The area was infiltrated with 1% Xylocaine with 1:100,000 epinephrine.      When good vasoconstriction had been achieved, the incision was made through the previous scar excising a small ellipse laterally which was sent to Pathology for histological examination.  Dissection was taken down to the level of the pectoralis muscle and extended cranially for 3 cm but only 1 cm inferiorly because of the previous skin necrosis.  There were signs of partial fat necrosis inferiorly which was to be expected.  The muscle was divided at its lateral one-third along with the muscle fibers and then the dissection was taken on medially, laterally, and inferiorly  to lower the level of the breast to be constructed.  The space easily accommodated the 500 mL [employer] tissue expander and was drained with a #15 Blake drain taken out laterally and secured with a 3-0 silk suture.  The wound was then closed in 3 layers without any sutures in the muscle itself.  Hemostasis was meticulous and repeated with lavage with dilute hydrogen peroxide.  No suture was used to correct rotational change because the approximation of the space and the tissue expander was so perfect.  The deep subcutaneous tissue was closed with interrupted buried 3-0 Vicryl sutures.  The dermis was closed with 2 layers of 3-0 and 4-0 PDS interrupted dermal sutures and finally a 5-0 PDS suture for a continuous subdermal closure with knots buried away from the apices.  The patient was returned to the recovery room in a stable condition to be discharged home and followed on an outpatient basis.  The drain was secured with a bioclusive dressing.  This was also used to isolate the wound from the outside environment.      [name], MD       GTT / LN  D: 09/17/2017 17:28     T: 09/18/2017 10:46  JOB #: [id number]  CC: [name] MD

## 2017-09-17 NOTE — Anesthesia Pre-Procedure Evaluation (Signed)
Anesthetic History   No history of anesthetic complications            Review of Systems / Medical History  Patient summary reviewed and pertinent labs reviewed    Pulmonary          Smoker         Neuro/Psych   Within defined limits           Cardiovascular    Hypertension              Exercise tolerance: >4 METS     GI/Hepatic/Renal  Within defined limits              Endo/Other    Diabetes: type 2, using insulin         Other Findings   Comments: Documentation of current medication  Current medications obtained, documented and obtained? YES      Risk Factors for Postoperative nausea/vomiting:       History of postoperative nausea/vomiting?  NO       Female?  YES       Motion sickness?  NO       Intended opioid administration for postoperative analgesia?  YES      Smoking Abstinence:  Current Smoker?   YES  Elective Surgery? YES  Seen preoperatively by anesthesiologist or proxy prior to day of surgery?  YES  Pt abstained from smoking 24 hours prior to anesthesia? N/A    Preventive care/screening for High Blood Pressure:  Aged 18 years and older: YES  Screened for high blood pressure: YES  Patients with high blood pressure referred to primary care provider   for BP management: YES                 Physical Exam    Airway  Mallampati: II  TM Distance: 4 - 6 cm  Neck ROM: normal range of motion   Mouth opening: Normal     Cardiovascular  Regular rate and rhythm,  S1 and S2 normal,  no murmur, click, rub, or gallop  Rhythm: regular  Rate: normal         Dental    Dentition: Lower dentition intact, Upper dentition intact and Caps/crowns     Pulmonary  Breath sounds clear to auscultation               Abdominal  GI exam deferred       Other Findings            Anesthetic Plan    ASA: 3  Anesthesia type: general          Induction: Intravenous  Anesthetic plan and risks discussed with: Patient

## 2017-09-17 NOTE — Other (Signed)
[place] care of patient upon arrival to PACU.  Patient drowsy, responsive to verbal stimuli.  Placed on monitor x3. VSS.  Medicated bedside for pain by anesthesia.

## 2017-09-17 NOTE — H&P (Signed)
Date of Surgery Update:  Barbara [name] was seen and examined.  Patient's history reviewed and patient re-examined without finding any changes    Signed By: [name], MD     September 17, 2017 7:39 AM

## 2017-09-17 NOTE — Other (Signed)
Phase 2 Recovery Summary  Patient arrived to Phase 2 at 1103  Report received from RN Almon Hercules. Patient states pain is 7/10. Patient is up to chair and will eat some crackers and drink ginger ale. At 1131 Patient is able to keep crackers down as well as ginger ale. Percocet 5/325 given for pain 7/10.  VS taken at 1205 from left thigh. BP 215/99. Dr. Mauri Brooklyn informed. Order for Labetalol 5 mg received and given at 1210. At 1219 BP 169/91. At 1219 Dr. Mauri Brooklyn came in to evaluate patient. BP 168/77 and heart rate is 96. Patient states pain is 5/10 and manageable. Per Dr. Mauri Brooklyn ok to discharge.   Vitals:    09/17/17 1107 09/17/17 1205 09/17/17 1219 09/17/17 1234   BP: 139/66 (!) 215/99 (!) 169/91 168/77   Pulse: (!) 113 (!) 114 100 96   Resp: 18      Temp:       SpO2: 95%      Weight:       Height:           oriented to time, place, person and situation    Lines and Drains  Peripheral Intravenous Line:   Peripheral IV 09/17/17 Left Hand (Active)   Site Assessment Clean, dry, & intact 09/17/2017  9:39 AM   Phlebitis Assessment 0 09/17/2017  9:39 AM   Infiltration Assessment 0 09/17/2017  9:39 AM   Dressing Status Clean, dry, & intact 09/17/2017  9:39 AM   Dressing Type Transparent;Tape 09/17/2017  9:39 AM   Hub Color/Line Status Pink;Infusing 09/17/2017  9:39 AM   Action Taken Open ports on tubing capped 09/17/2017  9:39 AM   Alcohol Cap Used Yes 09/17/2017  9:39 AM       Wound  Wound Breast (Active)   Number of days: 99       Wound Breast Left (Active)   Number of days: 99       Wound Breast Left (Active)   Number of days: 70       Wound Breast Left (Active)   DRESSING STATUS Clean, dry, and intact 09/17/2017  9:39 AM   DRESSING TYPE Sutures;Transparent film;Special tape (comment) 09/17/2017  9:39 AM   Number of days: 0       Wound Breast (Active)   Number of days: 0      No questions or concerns at this time.     Patient discharged to home with husband at 20.    Michaele Offer, RN

## 2017-09-17 NOTE — Brief Op Note (Signed)
BRIEF OPERATIVE NOTE    Date of Procedure: 09/17/2017   Preoperative Diagnosis: z85.3 hx of breast cancer  Postoperative Diagnosis: z85.3 hx of breast cancer      Procedure(s):  left breast reconstruction with exploration and submuscular introduction  of left breast tissue expander [ 500cc filled with 50cc saline ]    Surgeon(s) and Role:     [name], MD - Primary         Surgical Assistant: Jola Baptist    Surgical Staff:  Circ-1: Tana Conch, RN  Scrub Tech-1: Thayer Ohm, Felicita  Surg Asst-1: Cleora Fleet B  Surg Asst-2: Britt Bottom  Event Time In Time Out   Incision Start 0815    Incision Close 0931      Anesthesia: General   Estimated Blood Loss: minimal  Specimens:   ID Type Source Tests Collected by Time Destination   1 : LEFT BREAST LATERAL SCAR PREVIOUS HISTORY OF BREAST CANCER Preservative Breast  [name], MD 09/17/2017 [phone] Pathology     Findings: god submuscular space  Complications: none  Implants:   Implant Name Type Inv. Item Serial No. Manufacturer Lot No. LRB No. Used Action   NATRELLE TISSUE EXPANDER   [id number]  [id number] Left 1 Implanted

## 2017-09-18 ENCOUNTER — Emergency Department: Admit: 2017-09-18 | Payer: PRIVATE HEALTH INSURANCE | Primary: Nurse Practitioner

## 2017-09-18 ENCOUNTER — Inpatient Hospital Stay
Admit: 2017-09-18 | Discharge: 2017-09-18 | Disposition: A | Discharge status: Home / Self Care | Payer: PRIVATE HEALTH INSURANCE | Attending: Emergency Medicine

## 2017-09-18 DIAGNOSIS — T85848A Pain due to other internal prosthetic devices, implants and grafts, initial encounter: Secondary | ICD-10-CM

## 2017-09-18 LAB — METABOLIC PANEL, COMPREHENSIVE
A-G Ratio: 0.9 (ref 0.8–1.7)
ALT (SGPT): 34 U/L (ref 13–56)
AST (SGOT): 23 U/L (ref 15–37)
Albumin: 3.6 g/dL (ref 3.4–5.0)
Alk. phosphatase: 153 U/L — ABNORMAL HIGH (ref 45–117)
Anion gap: 13 mmol/L (ref 3.0–18)
BUN/Creatinine ratio: 20 (ref 12–20)
BUN: 9 MG/DL (ref 7.0–18)
Bilirubin, total: 0.7 MG/DL (ref 0.2–1.0)
CO2: 22 mmol/L (ref 21–32)
Calcium: 9.4 MG/DL (ref 8.5–10.1)
Chloride: 100 mmol/L (ref 100–108)
Creatinine: 0.46 MG/DL — ABNORMAL LOW (ref 0.6–1.3)
GFR est AA: >60 mL/min/{1.73_m2} (ref >60)
GFR est non-AA: >60 mL/min/{1.73_m2} (ref >60)
Globulin: 3.9 g/dL (ref 2.0–4.0)
Glucose: 241 mg/dL — ABNORMAL HIGH (ref 74–99)
Potassium: 3.8 mmol/L (ref 3.5–5.5)
Protein, total: 7.5 g/dL (ref 6.4–8.2)
Sodium: 135 mmol/L — ABNORMAL LOW (ref 136–145)

## 2017-09-18 LAB — CBC WITH AUTOMATED DIFF
ABS. BASOPHILS: 0 10*3/uL (ref 0.0–0.1)
ABS. EOSINOPHILS: 0 10*3/uL (ref 0.0–0.4)
ABS. LYMPHOCYTES: 2.9 10*3/uL (ref 0.9–3.6)
ABS. MONOCYTES: 1 10*3/uL (ref 0.05–1.2)
ABS. NEUTROPHILS: 13.3 10*3/uL — ABNORMAL HIGH (ref 1.8–8.0)
BASOPHILS: 0 % (ref 0–2)
EOSINOPHILS: 0 % (ref 0–5)
HCT: 43.7 % (ref 35.0–45.0)
HGB: 15.4 g/dL (ref 12.0–16.0)
LYMPHOCYTES: 17 % — ABNORMAL LOW (ref 21–52)
MCH: 31.6 PG (ref 24.0–34.0)
MCHC: 35.2 g/dL (ref 31.0–37.0)
MCV: 89.7 FL (ref 74.0–97.0)
MONOCYTES: 6 % (ref 3–10)
MPV: 10.3 FL (ref 9.2–11.8)
NEUTROPHILS: 77 % — ABNORMAL HIGH (ref 40–73)
PLATELET: 264 10*3/uL (ref 135–420)
RBC: 4.87 M/uL (ref 4.20–5.30)
RDW: 12.2 % (ref 11.6–14.5)
WBC: 17.2 10*3/uL — ABNORMAL HIGH (ref 4.6–13.2)

## 2017-09-18 MED ORDER — SODIUM CHLORIDE 0.9% BOLUS IV
0.9 % | Freq: Once | INTRAVENOUS | Status: AC
Start: 2017-09-18 — End: 2017-09-18
  Administered 2017-09-18: 06:00:00 via INTRAVENOUS

## 2017-09-18 MED ORDER — KETOROLAC TROMETHAMINE 30 MG/ML INJECTION
30 mg/mL (1 mL) | INTRAMUSCULAR | Status: AC
Start: 2017-09-18 — End: 2017-09-18
  Administered 2017-09-18: 06:00:00 via INTRAVENOUS

## 2017-09-18 MED ORDER — KETOROLAC TROMETHAMINE 30 MG/ML INJECTION
30 mg/mL (1 mL) | INTRAMUSCULAR | Status: DC
Start: 2017-09-18 — End: 2017-09-18

## 2017-09-18 MED ORDER — METOCLOPRAMIDE 5 MG/ML IJ SOLN
5 mg/mL | INTRAMUSCULAR | Status: DC
Start: 2017-09-18 — End: 2017-09-18

## 2017-09-18 MED ORDER — METOCLOPRAMIDE 5 MG/ML IJ SOLN
5 mg/mL | Freq: Four times a day (QID) | INTRAMUSCULAR | Status: DC
Start: 2017-09-18 — End: 2017-09-18
  Administered 2017-09-18: 06:00:00 via INTRAVENOUS

## 2017-09-18 MED FILL — HYDRALAZINE 20 MG/ML IJ SOLN: 20 mg/mL | INTRAMUSCULAR | Qty: 10

## 2017-09-18 MED FILL — KETOROLAC TROMETHAMINE 30 MG/ML INJECTION: 30 mg/mL (1 mL) | INTRAMUSCULAR | Qty: 1

## 2017-09-18 MED FILL — ONDANSETRON (PF) 4 MG/2 ML INJECTION: 4 mg/2 mL | INTRAMUSCULAR | Qty: 2

## 2017-09-18 MED FILL — PROPOFOL 10 MG/ML IV EMUL: 10 mg/mL | INTRAVENOUS | Qty: 180

## 2017-09-18 MED FILL — SODIUM CHLORIDE 0.9 % IV: INTRAVENOUS | Qty: 1000

## 2017-09-18 MED FILL — CEFAZOLIN 2 GM/50 ML IN DEXTROSE (ISO-OSMOTIC) IVPB: 2 gram/50 mL | INTRAVENOUS | Qty: 50

## 2017-09-18 MED FILL — SUCCINYLCHOLINE CHLORIDE 100 MG/5 ML (20 MG/ML) IV SYRINGE: 100 mg/5 mL (20 mg/mL) | INTRAVENOUS | Qty: 80

## 2017-09-18 MED FILL — LACTATED RINGERS IV: INTRAVENOUS | Qty: 600

## 2017-09-18 MED FILL — METOCLOPRAMIDE 5 MG/ML IJ SOLN: 5 mg/mL | INTRAMUSCULAR | Qty: 2

## 2017-09-18 MED FILL — XYLOCAINE-MPF 20 MG/ML (2 %) INJECTION SOLUTION: 20 mg/mL (2 %) | INTRAMUSCULAR | Qty: 60

## 2017-09-18 NOTE — ED Notes (Signed)
I have reviewed discharge instructions with the patient.  The patient verbalized understanding. Patient taken out of ED in wheelchair. Being driven home by daughter.

## 2017-09-18 NOTE — ED Notes (Signed)
X-ray being performed at bedside.

## 2017-09-18 NOTE — ED Triage Notes (Signed)
Tissue expanders placed in left chest today, d/c home. Unable to keep anything down, including pain medication. C/O pain at surgical site and HA.

## 2017-09-18 NOTE — ED Provider Notes (Signed)
EMERGENCY DEPARTMENT HISTORY AND PHYSICAL EXAM    1:05 AM      Date: 09/18/2017  Patient Name: Barbara [name]    History of Presenting Illness     Chief Complaint   Patient presents with   ??? Post-Op Problem   ??? Vomiting   ??? Headache   ??? Breast pain         History Provided By: Patient    Chief Complaint: HA w/ N/V  Duration:  Hours  Timing:  Constant  Location: head, GI  Quality: Aching  Severity: moderate to severe  Modifying Factors: none  Associated Symptoms:SOB, breast pain      Additional History (Context): Barbara [name] is a 51 y.o. female with h/o DM and breast CA who presents with severe frontal HA with N/V that started yesterday(WED) after having surgery. Pt also reports feeling mildly SOB as well. Pt says her sx started with the HA first. Pt has hx of breast CA; she has had a mastectomy done and now going through her reconstruction; Wednesday expanders were put in by Dr.Trengove-Jones. Since coming home she has not been able to keep anything down. She was given percocet for pain; her last dose was around 1830 but she threw it back up. Per the daughter she called her surgeon yesterday afternoon around 4-5 pm; says they were told give her a couple hours but if sx continue report to the ED; the pt was able to sleep for a while but when she woke her sx continued so they reported here. Pt has drain in place; her tube was drained 3 times today as directed and the expected fluid is draining. Notes she has had HA w/ associated sx of N/V in the past. Having breast pain but it due to the surgery. Has not checked for any fevers. Denies chills, fever, abdominal pain, diarrhea, urinary sx, back pain, neck pain, visual changes, weakness, numbness, or any other associated sx. No other complaints or concerns.    PCP: Elnora Morrison, MD      Past History     Past Medical History:  Past Medical History:   Diagnosis Date   ??? Back pain    ??? Breast cancer ([place]) 2008 or 2009   ??? Diabetes ([place])    ??? FH: chemotherapy     ??? Radiation therapy complication 9147    NOT complication, just radiation       Past Surgical History:  Past Surgical History:   Procedure Laterality Date   ??? BIOPSY BREAST     ??? BIOPSY FINE NEEDLE     ??? HX BREAST AUGMENTATION Right 2010   ??? HX BREAST RECONSTRUCTION Right 2011    per pt - attempted latissimus dorsi flap, then saline implant   ??? HX BREAST RECONSTRUCTION Bilateral 06/10/2017    right breast prosthesis and capsule removal and bilateral breast tissue  expander reconstruction.  performed by [name], MD at [place]   ??? HX BREAST RECONSTRUCTION Left 07/09/2017    left breast  EXCISION and closure of wound performed by [name], MD at [facility]   ??? HX CESAREAN SECTION     ??? HX LUMBAR DISKECTOMY      herniated disc   ??? HX MASTECTOMY Right 2010    Total mastectomy per pt.     ??? HX MASTECTOMY Left 06/10/2017    left mastectomy,Sentinal node biopsies. performed by [name], MD at [facility]   ???  HX MASTECTOMY Bilateral    ??? HX MASTOPEXY (BREAST LIFT) Left 2011   ??? IMPLANT BREAST SILICONE/EQ Right 1478    post mastectomy   ??? MULTIPLE DELIVERY C-SECTION      x2   ??? THUMB SUPPORT      thumb surgery to mobilize thumb       Family History:  Family History   Problem Relation Age of Onset   ??? Diabetes Mother    ??? No Known Problems Father        Social History:  Social History     Tobacco Use   ??? Smoking status: Current Every Day Smoker     Packs/day: 0.25   ??? Smokeless tobacco: Never Used   ??? Tobacco comment: 5-6 cigs per day   Substance Use Topics   ??? Alcohol use: No   ??? Drug use: No       Allergies:  Allergies   Allergen Reactions   ??? Tramadol Anaphylaxis         Review of Systems       Review of Systems   Constitutional: Negative for fever.   HENT: Negative for trouble swallowing.    Eyes: Negative for visual disturbance.   Respiratory: Positive for shortness of breath.    Cardiovascular: Negative for chest pain.    Gastrointestinal: Positive for nausea and vomiting. Negative for abdominal pain and diarrhea.   Genitourinary: Negative for difficulty urinating.   Musculoskeletal: Negative for back pain and neck pain.   Skin: Negative for wound.   Neurological: Positive for headaches. Negative for syncope.   Psychiatric/Behavioral: Negative for behavioral problems.   [name] other systems reviewed and are negative.        Physical Exam     Visit Vitals  BP 157/73   Pulse 100   Temp 98.5 ??F (36.9 ??C)   Resp 18   Ht 5' 2"  (1.575 m)   Wt 62.6 kg (138 lb)   SpO2 98%   BMI 25.24 kg/m??         Physical Exam   Constitutional: She is oriented to person, place, and time. She appears well-developed. No distress.   uncomfortable-appearing   HENT:   Head: Normocephalic and atraumatic.   Eyes: EOM are normal. Pupils are equal, round, and reactive to light.   Neck: Normal range of motion.   Normal rom   Cardiovascular: Normal rate and intact distal pulses.   Pulmonary/Chest: Effort normal and breath sounds normal. No respiratory distress. She exhibits tenderness.   Left mastectomy scar noted, left flank JP drain in place with serosanguinous drainage in tube   Abdominal: Soft. There is no tenderness.   Musculoskeletal: Normal range of motion. She exhibits no edema.   Mechanically stable   Neurological: She is alert and oriented to person, place, and time.   No focal deficits noted   Skin: Skin is warm.   Psychiatric: Her behavior is normal.   Nursing note and vitals reviewed.        Diagnostic Study Results     Labs -  Recent Results (from the past 12 hour(s))   CBC WITH AUTOMATED DIFF    Collection Time: 09/18/17  1:15 AM   Result Value Ref Range    WBC 17.2 (H) 4.6 - 13.2 K/uL    RBC 4.87 4.20 - 5.30 M/uL    HGB 15.4 12.0 - 16.0 g/dL    HCT 43.7 35.0 - 45.0 %    MCV 89.7 74.0 - 97.0  FL    MCH 31.6 24.0 - 34.0 PG    MCHC 35.2 31.0 - 37.0 g/dL    RDW 12.2 11.6 - 14.5 %    PLATELET 264 135 - 420 K/uL    MPV 10.3 9.2 - 11.8 FL     NEUTROPHILS 77 (H) 40 - 73 %    LYMPHOCYTES 17 (L) 21 - 52 %    MONOCYTES 6 3 - 10 %    EOSINOPHILS 0 0 - 5 %    BASOPHILS 0 0 - 2 %    ABS. NEUTROPHILS 13.3 (H) 1.8 - 8.0 K/UL    ABS. LYMPHOCYTES 2.9 0.9 - 3.6 K/UL    ABS. MONOCYTES 1.0 0.05 - 1.2 K/UL    ABS. EOSINOPHILS 0.0 0.0 - 0.4 K/UL    ABS. BASOPHILS 0.0 0.0 - 0.1 K/UL    DF AUTOMATED     METABOLIC PANEL, COMPREHENSIVE    Collection Time: 09/18/17  1:15 AM   Result Value Ref Range    Sodium 135 (L) 136 - 145 mmol/L    Potassium 3.8 3.5 - 5.5 mmol/L    Chloride 100 100 - 108 mmol/L    CO2 22 21 - 32 mmol/L    Anion gap 13 3.0 - 18 mmol/L    Glucose 241 (H) 74 - 99 mg/dL    BUN 9 7.0 - 18 MG/DL    Creatinine 0.46 (L) 0.6 - 1.3 MG/DL    BUN/Creatinine ratio 20 12 - 20      GFR est AA >60 >60 ml/min/1.69m    GFR est non-AA >60 >60 ml/min/1.759m   Calcium 9.4 8.5 - 10.1 MG/DL    Bilirubin, total 0.7 0.2 - 1.0 MG/DL    ALT (SGPT) 34 13 - 56 U/L    AST (SGOT) 23 15 - 37 U/L    Alk. phosphatase 153 (H) 45 - 117 U/L    Protein, total 7.5 6.4 - 8.2 g/dL    Albumin 3.6 3.4 - 5.0 g/dL    Globulin 3.9 2.0 - 4.0 g/dL    A-G Ratio 0.9 0.8 - 1.7         Radiologic Studies -   XR CHEST PORT    (Results Pending)         Medical Decision Making   I am the first provider for this patient.    I reviewed the vital signs, available nursing notes, past medical history, past surgical history, family history and social history.    Vital Signs-Reviewed the patient's vital signs.    Pulse Oximetry Analysis - 99% RA    Records Reviewed: Nursing Notes and Old Medical Records (Time of Review: 1:05 AM)    Provider Notes (Medical Decision Making):  MDM  Number of Diagnoses or Management Options  Diagnosis management comments: [age] CF with PMHx mastectomy with procedure earlier today presents with 1-day h/o HA and vomiting.  No fevers, no abdominal pain.  Examination with left mastectomy and left JP drain in place but otherwise generally unremarkable with ctab and no  abdominal tenderness, no focal deficits.  Pt HA similar to previous, will treat symptomatically and evaluate for acute process.    2:21 AM  Wbc elevated, non-specific, likely post-surgical.  Work-up otherwise ok, cxr no acute process to my read.  Pt doing ok, states HA is gone, feeling better.  No vomiting in ED.  Discussed results and poc for dc home, symptom management, follow-up, return precautions.  Amount and/or Complexity of Data Reviewed  Clinical lab tests: ordered and reviewed  Obtain history from someone other than the patient: yes  Review and summarize past medical records: yes    Patient Progress  Patient progress: stable      Medications   metoclopramide HCl (REGLAN) injection 5 mg (5 mg IntraVENous Given 09/18/17 0118)   sodium chloride 0.9 % bolus infusion 1,000 mL (1,000 mL IntraVENous New Bag 09/18/17 0115)   ketorolac (TORADOL) injection 30 mg (30 mg IntraVENous Given 09/18/17 0118)       Procedures:   Procedures    ED Course: Progress Notes, Reevaluation, and Consults:XR Preliminary reading done by Dr. Jeani Hawking, MD; pt xray shows scattered interstitial opacities left more than the right.        Diagnosis     Clinical Impression:   1. Acute nonintractable headache, unspecified headache type      Disposition: home    Follow-up Information     Follow up With Specialties Details Why Contact Info    [name], MD Plastic Surgery Schedule an appointment as soon as possible for a visit today Follow up [address]  [address]  [place] [address]  [phone]      [facility] EMERGENCY DEPT Emergency Medicine  If symptoms worsen, As needed [place]  [phone]              Medication List      ASK your doctor about these medications    gabapentin 300 mg capsule  Commonly known as:  NEURONTIN  Take 1 Cap by mouth three (3) times daily.     insulin NPH/insulin regular 100 unit/mL (70-30) injection  Commonly known as:  NOVOLIN 70/30, HUMULIN 70/30   Administer 25 units subcut twice daily 30 minutes before breakfast and dinner     letrozole 2.5 mg tablet  Commonly known as:  FEMARA     LEUPROLIDE (81MONTH)-NORETHIND     PERCOCET 5-325 mg per tablet  Generic drug:  oxyCODONE-acetaminophen     ramipril 10 mg capsule  Commonly known as:  ALTACE  Take 1 Cap by mouth daily.          _______________________________    Attestations:  Scribe [place] acting as a scribe for and in the presence of Jeani Hawking, MD      September 18, 2017 at 1:05 AM       Provider Attestation:      I personally performed the services described in the documentation, reviewed the documentation, as recorded by the scribe in my presence, and it accurately and completely records my words and actions. September 18, 2017 at 1:05 AM - Jeani Hawking, MD    _______________________________

## 2017-09-30 ENCOUNTER — Encounter

## 2017-10-01 ENCOUNTER — Ambulatory Visit: Attending: Physical Medicine & Rehabilitation | Primary: Nurse Practitioner

## 2017-10-01 ENCOUNTER — Ambulatory Visit
Admit: 2017-10-01 | Discharge: 2017-10-01 | Payer: PRIVATE HEALTH INSURANCE | Attending: Physical Medicine & Rehabilitation | Primary: Nurse Practitioner

## 2017-10-01 DIAGNOSIS — M545 Low back pain, unspecified: Secondary | ICD-10-CM

## 2017-10-01 MED ORDER — GABAPENTIN 600 MG TAB
600 mg | ORAL_TABLET | Freq: Three times a day (TID) | ORAL | 1 refills | Status: DC
Start: 2017-10-01 — End: 2017-12-22
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
## 2017-10-10 NOTE — Nurse Consult (Signed)
F/u call to pt. Pt states she is doing well at this time, She is taking Femara without complaints. She is also getting monthly Lupron. She has a f/u ov with Dr Brent General in January 2019 and f/u ov with Dr Jennye Boroughs 10-20-17. All questions answered. No further f/u from navigator needed.

## 2017-10-13 ENCOUNTER — Ambulatory Visit

## 2017-10-13 ENCOUNTER — Encounter

## 2017-10-13 ENCOUNTER — Inpatient Hospital Stay: Admit: 2017-10-13 | Payer: PRIVATE HEALTH INSURANCE | Attending: Family | Primary: Nurse Practitioner

## 2017-10-13 DIAGNOSIS — Z13820 Encounter for screening for osteoporosis: Secondary | ICD-10-CM

## 2017-10-20 ENCOUNTER — Ambulatory Visit
Admit: 2017-10-20 | Discharge: 2017-10-20 | Payer: PRIVATE HEALTH INSURANCE | Attending: Surgery | Primary: Nurse Practitioner

## 2017-10-20 DIAGNOSIS — Z170 Estrogen receptor positive status [ER+]: Secondary | ICD-10-CM

## 2017-10-20 NOTE — Progress Notes (Signed)
Chief Complaint   Patient presents with   ??? Follow-up     follow up breast exam s/p left mastectomy 8.28.18.  Bilateral reconstruction with tissue expanders.  Denies breast pain in regards to Dr. Jennye Boroughs surgery only pain related to reconstruction.      1. Have you been to the ER, urgent care clinic since your last visit?  Hospitalized since your last visit?ED 12.1.18. Chest pain. 12.6.18 head pain and vomitting.     2. Have you seen or consulted any other health care providers outside of the [facility] since your last visit?  Include any pap smears or colon screening. No

## 2017-10-20 NOTE — Progress Notes (Signed)
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 Ronnald Ramp 06/10/17 as well as a history of right breast cancer in 2008.  Her Oncotype returned as 13 with an 8% 5 year recurrence with Tamoxifen alone. She has been started on ovarian suppression and aromatase inhibitor (Lupron and Femara) per Dr. Brent General.     She presented August 2018  for evaluation regarding a new diagnosis of a left breast cancer.  The patient had a recent left breast mammogram that showed an area of calcifications and possibly small mass in about the 1130 position 3 cm from the nipple measuring about 1.8 cm.  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  The tumor was estrogen and progesterone receptor positive, HER-2 negative.    Of note in 2008 the patient developed right breast cancer which apparently was fairly large.  She believes she had gene testing at that time when she was 52 years old.  She underwent a mastectomy and had neoadjuvant chemotherapy.  Subsequently she had radiation therapy.  She apparently had some latissimus dorsi flap reconstruction but this failed and then subsequent to that she had an implant.  Subsequent to that she had a reduction mammoplasty of the left breast.  She has not been aware of any recurrent disease since her first cancer until the development of this new cancer in the other breast.  She states a pill was not recommended at that time.    She underwent revision of scar with excision of necrotic tissue and removal of expander by Dr. Ronnald Ramp 07/09/17 and subsequent submuscular placement 09/17/17.  She is extremely pleased with her result.  She is taking Femara and Lupron per Dr. Brent General.  She reports hot flashes and mood  swings.  She was prescribed Effexor but states it causes nausea.  She notes chest discomfort related to her recent surgery.  She is otherwise without complaint.      Allergies   Allergen Reactions   ??? Tramadol Anaphylaxis     Past Medical History:   Diagnosis Date   ??? Back pain    ??? Breast cancer ([place]) 2008 or 2009   ??? Diabetes ([place])    ??? FH: chemotherapy    ??? Radiation therapy complication 7116    NOT complication, just radiation     Past Surgical History:   Procedure Laterality Date   ??? BIOPSY BREAST     ??? BIOPSY FINE NEEDLE     ??? HX BREAST AUGMENTATION Right 2010   ??? HX BREAST AUGMENTATION Left 09/17/2017    left breast reconstruction with exchange of tissue expander per breast prostesis and left breast tissue expander performed by [name], MD at [place]   ??? HX BREAST RECONSTRUCTION Right 2011    per pt - attempted latissimus dorsi flap, then saline implant   ??? HX BREAST RECONSTRUCTION Bilateral 06/10/2017    right breast prosthesis and capsule removal and bilateral breast tissue  expander reconstruction.  performed by [name], MD at [place]   ??? HX BREAST RECONSTRUCTION Left 07/09/2017    left breast  EXCISION and closure of wound performed by [name], MD at [place]   ??? HX BREAST RECONSTRUCTION Bilateral     Tissue expanders,  to prepare for implants    ??? HX CESAREAN SECTION     ??? HX LUMBAR DISKECTOMY      herniated disc   ??? HX MASTECTOMY Right 2010    Total mastectomy per pt.     ??? HX MASTECTOMY Left 06/10/2017    left mastectomy,Sentinal node biopsies. performed by [name], MD at [place]   ??? HX MASTECTOMY Bilateral    ??? HX MASTOPEXY (BREAST LIFT) Left 2011   ??? IMPLANT BREAST SILICONE/EQ Right 1443    post mastectomy   ??? MULTIPLE DELIVERY C-SECTION      x2   ??? THUMB SUPPORT      thumb surgery to mobilize thumb     Social History     Socioeconomic History   ??? Marital status: MARRIED     Spouse name: Not on file   ??? Number of children: Not on file    ??? Years of education: Not on file   ??? Highest education level: Not on file   Tobacco Use   ??? Smoking status: Current Every Day Smoker     Packs/day: 0.25   ??? Smokeless tobacco: Never Used   ??? Tobacco comment: 5-6 cigs per day   Substance and Sexual Activity   ??? Alcohol use: No   ??? Drug use: No   Other Topics Concern     REVIEW OF SYSTEMS     Constitutional: No fever, weight loss, fatigue or recent chills.   Skin:  No recent rashes, dermatitis or abnormal moles.   HEENT:  No changes in vision, vertigo, epistaxis, dysphasia, or hoarseness.   Cardiac:  No chest pain, palpitations, or edema.    Respiratory: No chronic cough, shortness of breath, wheezing, hemoptysis, or history of sleep apnea.  Patient has been a longtime smoker and still smokes about 1 pack of cigarettes per day-probable mild COPD   Breasts/GYN:   See the history of present illness   Gastrointestinal:  No significant food intolerances, no recent vomiting, no chronic abdominal pain, no change in bowel habits, no melena. No history of GERD.   Genitourinary:  No history of hematuria, dysuria, frequency, or stress urinary incontinence. No nocturia.   Musculoskeletal: No weakness, joint pains, or arthritis.  Patient does have a history of chronic back pain   Endocrine:  No history of thyroid disease.  History of insulin-dependent diabetes with neuropathy.  Patient's previous hemoglobin A1c was over 8   Lymph/hemo:  No history of blood transfusions or easy bruising. No anemia.  Patient states that she had "blood clots" with previous surgeries but she pointed to her chest   Neuro: No dizziness or headaches or fainting.  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    PHYSICAL EXAM    Visit Vitals  BP 110/71 (BP 1 Location: Left arm, BP Patient Position: Sitting)   Pulse 82   Temp 97.9 ??F (36.6 ??C) (Oral)   Resp 16   Ht 5' 2" (1.575 m)   Wt 61.6 kg (135 lb 12.8 oz)   BMI 24.84 kg/m??           Constitutional:  Well-developed, well-nourished, no acute distress.     Breast Exam: incisions clean, well healed, no dominant lesions or masses      Pathology:  06/10/17   A: SENTINEL LYMPH NODE #1, LEFT AXILLA, BIOPSY:   ONE LYMPH NODE, NEGATIVE FOR MALIGNANCY (0/1).   B: SENTINEL LYMPH NODE #2, LEFT AXILLA, BIOPSY:  ONE LYMPH NODE, NEGATIVE FOR MALIGNANCY (0/1).   C: LEFT BREAST, SIMPLE MASTECTOMY:   INVASIVE CARCINOMA OF THE BREAST.   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   Nuclear Grade: Grade II (intermediate)   Lobular Carcinoma In Situ (LCIS): Present   Tumor Extent   Nipple DCIS: DCIS does not involve the nipple epidermis   Accessory Findings   Treatment Effect in the Breast: No known presurgical therapy   Treatment Effect in the Lymph Nodes: No known presurgical therapy   MARGINS   Invasive Carcinoma Margins: Uninvolved by invasive carcinoma   Distance from Closest Margin in Millimeters: Distance is > 10 Millimeters   Closest Margin: Posterior   DCIS Margins: Uninvolved by DCIS   Distance of DCIS from Closest Margin in Millimeters: Distance is > 10 Millimeters   Closest Margin: Posterior   LYMPH NODES   Number of Lymph Nodes with Macrometastases (> 2 mm): 0   Number of Lymph Nodes with Micrometastases (> 0.2 mm to 2 mm and / or > 200 cells): 0   Number of Lymph Nodes with Isolated Tumor Cells (<= 0.2 mm and <= 200 cells): 0   Number of Lymph Nodes Examined: 2 (see above)   Number of Sentinel Nodes Examined: 2   PATHOLOGIC STAGE CLASSIFICATION (pTNM, AJCC 8th Edition)    Primary Tumor (Invasive Carcinoma) (pT): pT1c   Regional Lymph Nodes (pN)   Modifier: (sn): Only sentinel node(s) evaluated.   Category (pN): pN0   D: RIGHT BREAST, CAPSULECTOMY:   BENIGN SOFT TISSUE IDENTIFIED.   E: IMPLANT, RIGHT BREAST, REMOVAL:   BREAST PROSTHESIS IDENTIFIED (GROSS EXAMINATION ONLY).       05/16/17   LEFT BREAST MASS AT 11:30 O'CLOCK (2-3 CM FROM NIPPLE), CORE BIOPSIES:   INFILTRATING CARCINOMA, NUCLEAR GRADE 1, WITH MIXED DUCTAL AND LOBULAR   FEATURES.   ESTROGEN RECEPTOR: POSITIVE (100%) POSITIVE > 1%   PROGESTERONE RECEPTOR: POSITIVE (100%) POSITIVE > 1%   Negative for Her-2 amplification       Patient Active Problem List   Diagnosis Code   ??? Essential hypertension I10   ??? 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 Pain of left hip joint M25.552   ??? 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 Low back pain M54.5   ??? 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 Smoker F17.200   ??? Dupuytren contracture M72.0   ??? Breast cancer of upper-outer quadrant of left female breast (HCC) C50.412   ??? S/P mastectomy, left Z42.13      52 year old woman with ER/PR positive, HER negative T1cN0 left breast cancer s/p left mastectomy, sentinel node biopsy with reconstruction and revision of right breast per Dr. Ronnald Ramp 06/10/17 as well as a history of right breast cancer in 2008.  Her Oncotype returned as 13 with an 8% 5 year recurrence with Tamoxifen alone. Continue ovarian suppression and aromatase inhibitor (Lupron and Femara) per Dr. Brent General.  Continue to follow up with  Dr. Ronnald Ramp.   She is interested in physical therapy referral as well, to start when okay with Dr. Ronnald Ramp. Follow up 3 months.  Please call sooner with questions or concerns.

## 2017-10-20 NOTE — Patient Instructions (Signed)
If you have any questions or concerns about today's appointment, the verbal and/or written instructions you were given for follow up care, please call our office at [phone].    [facility]  [address], [address]  [place] [address]    [phone] office  [phone]

## 2017-10-27 ENCOUNTER — Ambulatory Visit: Attending: Physical Medicine & Rehabilitation | Primary: Nurse Practitioner

## 2017-10-27 ENCOUNTER — Ambulatory Visit
Admit: 2017-10-27 | Discharge: 2017-10-27 | Payer: PRIVATE HEALTH INSURANCE | Attending: Physical Medicine & Rehabilitation | Primary: Nurse Practitioner

## 2017-10-27 DIAGNOSIS — M545 Low back pain, unspecified: Secondary | ICD-10-CM

## 2017-10-27 MED ORDER — DULOXETINE 30 MG CAP, DELAYED RELEASE
30 mg | ORAL_CAPSULE | Freq: Every day | ORAL | 1 refills | Status: DC
Start: 2017-10-27 — End: 2017-12-22
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
## 2017-10-31 NOTE — Telephone Encounter (Signed)
If she has not tried Topamax and no hx of kidney stones or glaucoma, we can try Topamax 25mg  QHS x1 week, then increase to 2 Tab QHS disp #60 w/ no refills. Pt should set-up follow up. She should talk to her oncologist about her night sweats as there is medication for that. They usually use Effexor

## 2017-10-31 NOTE — Telephone Encounter (Signed)
Patient called in states that she is on chemo medication and going through menopause and now taking this DULoxetine (CYMBALTA) 30 mg capsule her night sweats are out of control. Patient wants to know if there is an alternative as she can not continue to take the cymbalta. Please contact patient at [phone].

## 2017-11-04 NOTE — Telephone Encounter (Signed)
Spoke to Barbara [name]. She would like to just wait until her follow up appointment with Dr. Vella Kohler to discuss further medication options. I have scheduled her follow up appointment for her CT Myelogram, patient is aware to bring her disk.

## 2017-11-11 ENCOUNTER — Inpatient Hospital Stay: Admit: 2017-11-11 | Payer: PRIVATE HEALTH INSURANCE | Attending: Diagnostic Radiology | Primary: Nurse Practitioner

## 2017-11-11 ENCOUNTER — Ambulatory Visit: Admit: 2017-11-11 | Payer: PRIVATE HEALTH INSURANCE | Primary: Nurse Practitioner

## 2017-11-11 DIAGNOSIS — M545 Low back pain: Secondary | ICD-10-CM

## 2017-11-11 LAB — GLUCOSE, POC: Glucose (POC): 240 mg/dL — ABNORMAL HIGH (ref 70–110)

## 2017-11-11 MED ORDER — INSULIN LISPRO 100 UNIT/ML INJECTION
100 unit/mL | Freq: Once | SUBCUTANEOUS | Status: AC
Start: 2017-11-11 — End: 2017-11-11
  Administered 2017-11-11: 16:00:00 via SUBCUTANEOUS

## 2017-11-11 MED ORDER — OXYCODONE-ACETAMINOPHEN 5 MG-325 MG TAB
5-325 mg | ORAL | Status: DC | PRN
Start: 2017-11-11 — End: 2017-11-11
  Administered 2017-11-11: 18:00:00 via ORAL

## 2017-11-11 MED ORDER — GLUCAGON 1 MG INJECTION
1 mg | INTRAMUSCULAR | Status: DC | PRN
Start: 2017-11-11 — End: 2017-11-11

## 2017-11-11 MED ORDER — LIDOCAINE (PF) 10 MG/ML (1 %) IJ SOLN
10 mg/mL (1 %) | Freq: Once | INTRAMUSCULAR | Status: AC
Start: 2017-11-11 — End: 2017-11-11
  Administered 2017-11-11: 16:00:00 via SUBCUTANEOUS

## 2017-11-11 MED ORDER — GLUCOSE 4 GRAM CHEWABLE TAB
4 gram | ORAL | Status: DC | PRN
Start: 2017-11-11 — End: 2017-11-11

## 2017-11-11 MED ORDER — DEXTROSE 50% IN WATER (D50W) IV
INTRAVENOUS | Status: DC | PRN
Start: 2017-11-11 — End: 2017-11-11

## 2017-11-11 MED ORDER — IOPAMIDOL 41 % INTRATHECAL
200 mg iodine /mL (41 %) | Freq: Once | INTRATHECAL | Status: AC
Start: 2017-11-11 — End: 2017-11-11
  Administered 2017-11-11: 16:00:00 via INTRASPINAL

## 2017-11-11 MED FILL — ISOVUE-M 200  41 % INTRATHECAL SOLUTION: 200 mg iodine /mL (41 %) | INTRATHECAL | Qty: 20

## 2017-11-11 MED FILL — INSULIN LISPRO 100 UNIT/ML INJECTION: 100 unit/mL | SUBCUTANEOUS | Qty: 1

## 2017-11-11 MED FILL — OXYCODONE-ACETAMINOPHEN 5 MG-325 MG TAB: 5-325 mg | ORAL | Qty: 1

## 2017-11-11 MED FILL — LIDOCAINE (PF) 10 MG/ML (1 %) IJ SOLN: 10 mg/mL (1 %) | INTRAMUSCULAR | Qty: 10

## 2017-11-11 NOTE — Procedures (Signed)
Procedures  by Minerva Fester, MD at 11/11/17 1132                Author: Minerva Fester, MD  Service: RADIOLOGY  Author Type: Physician       Filed: 11/11/17 1132  Date of Service: 11/11/17 1132  Status: Signed          Editor: Minerva Fester, MD (Physician)                               Vascular & Interventional Radiology Brief Procedure Note      Interventional Radiologist: Minerva Fester, MD      Pre-operative Diagnosis:  Back pain      Post-operative Diagnosis: Same as pre-op dx      Procedure(s) Performed:  myelogram      Anesthesia:  Local       Findings:  Uncomplicated myelogram performed via L3/4 puncture.       Complications: None      Estimated Blood Loss:  minimal      Tubes and Drains: None      Specimens: None      Condition: Good          Minerva Fester, MD   [facility]   Vascular & Interventional Radiology   11/11/2017

## 2017-11-11 NOTE — Procedures (Signed)
Vascular & Interventional Radiology Brief Procedure Note  Interventional Radiologist: Minerva Fester, MD  Pre-operative Diagnosis:  Back pain    Post-operative Diagnosis: Same as pre-op dx    Procedure(s) Performed:  myelogram    Anesthesia:  Local     Findings:  Uncomplicated myelogram performed via L3/4 puncture.     Complications: None    Estimated Blood Loss:  minimal    Tubes and Drains: None    Specimens: None    Condition: Good       Minerva Fester, MD  [facility]  Vascular & Interventional Radiology  11/11/2017

## 2017-11-11 NOTE — Other (Signed)
Lumbar Myelogram was performed and pt. Tolerated exam well. Sent to CT @ 1130. [facility] in [facility] was notified.

## 2017-11-11 NOTE — Other (Signed)
Pt. Was handed off to [facility] in [place].

## 2017-11-11 NOTE — Other (Addendum)
Received report from Butch Penny in [place]. Patient tolerated procedure well. Going to CT now then would be up to [facility].    1150- Received report from [place] in [place]. Patients pain in 7/10. Bandaid over puncture site on lower back. Dr. Nicole Kindred is putting in orders. Contact number is [phone]

## 2017-11-11 NOTE — H&P (Signed)
The patient is an appropriate candidate to undergo myelogram.    Patient assessed immediately prior to induction.  Anesthesia plan as follows:   Local/No Anesthesia.   History and Physical update:  H&P was reviewed and the patient was examined.  No changes have occurred in the patient's condition since the H&P was completed.    Minerva Fester, MD  Vascular & Interventional Radiology  [facility]  11/11/2017

## 2017-11-11 NOTE — Progress Notes (Signed)
Ms Barbara [name] had a lumbar myelogram today. She states her current pain level is a 6 or 7 out of 10 and that has increased by 1 or 2 since her procedure. She has one bandaid on her low back. Dr Stewart's pager number is [phone]. This was verbally handed off to [facility] prior to transfer back to [facility] SP-3 1.

## 2017-11-11 NOTE — Other (Signed)
Phase 2 Recovery Summary  Patient arrived to Phase 2 at 1210  Report received from Watson, CT tech  Vitals:    11/11/17 1305 11/11/17 1330 11/11/17 1400 11/11/17 1500   BP: 106/49 124/80 100/68 132/74   Pulse: 71      Resp:       Temp:       SpO2: 99% 99% 98% 97%   Weight:       Height:           oriented to time, place, person and situation    Lines and Drains  Peripheral Intravenous Line:      Wound  Wound Breast (Active)   Number of days: 154       Wound Breast Left (Active)   Number of days: 154       Wound Breast Left (Active)   Number of days: 125       Wound Breast Left (Active)   Number of days: 55       Wound Breast (Active)   Number of days: 55      Patient arrived to phase 2 from CT via transport. Alert and oriented x4. Patient stated pain was 7/10. Contacted Sharyn Lull and would refer message for pain medication upon doctors return. Vital signs taken and placed on continuous monitoring. Lunch box ordered and provided with diet pepsi to drink. Patient resting flat in bed. Daughter to arrived soon.     Percocet ordered and given to patient. After first hour, patient able to raise HOB 30 degrees. Call bell in reach. Daughter arrived and sitting with patient at bedside.     1300- Patient assisted to the bathroom. Voided without complications. Assisted back to the bed. Resting comfortably in bed. Stated pain was beginning to decrease, 6/10 pain. Procedure site checked. Bandaid covering site. Clean, dry and intact.     1330-Gave report to [place], Therapist, sports. Monitoring patient.     1500- Checked on patient and adjusted HOB. Reviewed discharge instructions with patient and daughter. Daughter signed for discharge.     1530- Patient discontinued monitoring and allowed to get dressed. Patient transported to vehicle via wheelchair.     Patient discharged to home with daughter, Janett Billow  at [place]

## 2017-11-11 NOTE — Other (Signed)
Pt prepped for Myelogram, Blood Sugar 240 and treated. Pain 7/10 in lower back.  Daughter sleeping in car outside [place].  Will continue to monitor until leaving for procedure

## 2017-11-13 ENCOUNTER — Inpatient Hospital Stay
Admit: 2017-11-13 | Discharge: 2017-11-13 | Disposition: A | Discharge status: Home / Self Care | Payer: PRIVATE HEALTH INSURANCE | Attending: Emergency Medicine

## 2017-11-13 DIAGNOSIS — R51 Headache: Secondary | ICD-10-CM

## 2017-11-13 MED ORDER — CAFFEINE-SODIUM BENZOATE 250 MG/ML IJ SOLN
250125 mg/mL (125 mg/mL caffeine) | Freq: Once | INTRAMUSCULAR | Status: AC
Start: 2017-11-13 — End: 2017-11-13
  Administered 2017-11-13: 16:00:00 via INTRAVENOUS

## 2017-11-13 MED ORDER — ASPIRIN-ACETAMINOPHEN-CAFFEINE 250 MG-250 MG-65 MG TAB
250-250-65 mg | ORAL_TABLET | Freq: Four times a day (QID) | ORAL | 0 refills | Status: DC | PRN
Start: 2017-11-13 — End: 2017-12-30

## 2017-11-13 MED ORDER — PROCHLORPERAZINE MALEATE 5 MG TAB
5 mg | ORAL_TABLET | Freq: Three times a day (TID) | ORAL | 0 refills | Status: AC | PRN
Start: 2017-11-13 — End: 2017-11-20

## 2017-11-13 MED ORDER — PROCHLORPERAZINE EDISYLATE 5 MG/ML INJECTION
5 mg/mL | INTRAMUSCULAR | Status: AC
Start: 2017-11-13 — End: 2017-11-13
  Administered 2017-11-13: 16:00:00 via INTRAVENOUS

## 2017-11-13 MED ORDER — SODIUM CHLORIDE 0.9% BOLUS IV
0.9 % | Freq: Once | INTRAVENOUS | Status: AC
Start: 2017-11-13 — End: 2017-11-13
  Administered 2017-11-13: 16:00:00 via INTRAVENOUS

## 2017-11-13 MED ORDER — DIPHENHYDRAMINE 25 MG CAP
25 mg | ORAL_CAPSULE | Freq: Four times a day (QID) | ORAL | 0 refills | Status: AC | PRN
Start: 2017-11-13 — End: 2017-11-18

## 2017-11-13 MED ORDER — DIPHENHYDRAMINE HCL 50 MG/ML IJ SOLN
50 mg/mL | INTRAMUSCULAR | Status: AC
Start: 2017-11-13 — End: 2017-11-13
  Administered 2017-11-13: 16:00:00 via INTRAVENOUS

## 2017-11-13 MED ORDER — ACETAMINOPHEN 500 MG TAB
500 mg | ORAL | Status: AC
Start: 2017-11-13 — End: 2017-11-13
  Administered 2017-11-13: 16:00:00 via ORAL

## 2017-11-13 MED ORDER — OXYCODONE-ACETAMINOPHEN 5 MG-325 MG TAB
5-325 mg | ORAL | Status: AC
Start: 2017-11-13 — End: 2017-11-13
  Administered 2017-11-13: 16:00:00 via ORAL

## 2017-11-13 MED FILL — OXYCODONE-ACETAMINOPHEN 5 MG-325 MG TAB: 5-325 mg | ORAL | Qty: 1

## 2017-11-13 MED FILL — CAFFEINE-SODIUM BENZOATE 250 MG/ML IJ SOLN: 250 mg/mL (125 mg/mL caffeine) | INTRAMUSCULAR | Qty: 2

## 2017-11-13 MED FILL — MAPAP EXTRA STRENGTH 500 MG TABLET: 500 mg | ORAL | Qty: 2

## 2017-11-13 MED FILL — DIPHENHYDRAMINE HCL 50 MG/ML IJ SOLN: 50 mg/mL | INTRAMUSCULAR | Qty: 1

## 2017-11-13 MED FILL — SODIUM CHLORIDE 0.9 % IV: INTRAVENOUS | Qty: 1000

## 2017-11-13 MED FILL — PROCHLORPERAZINE EDISYLATE 5 MG/ML INJECTION: 5 mg/mL | INTRAMUSCULAR | Qty: 2

## 2017-11-13 NOTE — ED Notes (Signed)
Pt given blanket for comfort

## 2017-11-13 NOTE — ED Triage Notes (Signed)
Patient states she had a myleogram opn Tuesday, states she has had a headache for 48 hours, lower back pain (the site of the procedure)

## 2017-11-13 NOTE — ED Notes (Signed)
I have reviewed discharge instructions with the patient.  The patient verbalized understanding.

## 2017-11-13 NOTE — ED Triage Notes (Signed)
Patient did contact Dr Les Pou office, but they did not return her message

## 2017-11-13 NOTE — ED Provider Notes (Signed)
EMERGENCY DEPARTMENT HISTORY AND PHYSICAL EXAM    10:04 AM      Date: 11/13/2017  Patient Name: Barbara [name]    History of Presenting Illness     No chief complaint on file.        History Provided By: Patient    Chief Complaint: HA  Duration: 1 Days  Timing:  Constant  Location: Diffuse, temporal, occipital   Quality: throbbing, pounding  Severity: 9 out of 10  Modifying Factors: worse upon standing, after myelogram  Associated Symptoms: back pain      Additional History (Context): Barbara [name] is a 52 y.o. female with chronic pain, breast CA, DM who presents with 1 day of severe HA. Pt had a myelogram and a CT with contrast 2 days ago for evaluation of chronic back pain. Now she c/o pounding, throbbing HA which is worse upon standing. No hx frequent HAs. She denies N/V/D, incontinence, focal weakness or numbness, and other associated sx. Hx spinal surgery, b/l mastectomy. Pt with breast CA is on oral chemotherapy right now. No hx HTN. In the last 10 hrs pt only took Gabapentin.          PCP: Elnora Morrison, MD    Current Outpatient Medications   Medication Sig Dispense Refill   ??? DULoxetine (CYMBALTA) 30 mg capsule Take 1 Cap by mouth daily. 30 Cap 1   ??? venlafaxine HCl (VENLAFAXINE PO) Take  by mouth.     ??? gabapentin (NEURONTIN) 600 mg tablet Take 1 Tab by mouth three (3) times daily. 90 Tab 1   ??? oxyCODONE-acetaminophen (PERCOCET) 5-325 mg per tablet Take 1 Tab by mouth every four (4) hours as needed for Pain.     ??? letrozole (FEMARA) 2.5 mg tablet Take 2.5 mg by mouth daily.     ??? leuprolide/norethindrone acet (LEUPROLIDE, 94MONTH,-NORETHIND) by Does Not Apply route every month.     ??? ramipril (ALTACE) 10 mg capsule Take 1 Cap by mouth daily. 30 Cap 3   ??? insulin NPH/insulin regular (NOVOLIN 70/30, HUMULIN 70/30) 100 unit/mL (70-30) injection Administer 25 units subcut twice daily 30 minutes before breakfast and dinner 20 mL 0       Past History     Past Medical History:  Past Medical History:    Diagnosis Date   ??? Back pain    ??? Breast cancer ([place]) 2008 or 2009   ??? Diabetes ([place])    ??? FH: chemotherapy    ??? Radiation therapy complication 4098    NOT complication, just radiation       Past Surgical History:  Past Surgical History:   Procedure Laterality Date   ??? BIOPSY BREAST     ??? BIOPSY FINE NEEDLE     ??? HX BREAST AUGMENTATION Right 2010   ??? HX BREAST AUGMENTATION Left 09/17/2017    left breast reconstruction with exchange of tissue expander per breast prostesis and left breast tissue expander performed by [name], MD at [place]   ??? HX BREAST RECONSTRUCTION Right 2011    per pt - attempted latissimus dorsi flap, then saline implant   ??? HX BREAST RECONSTRUCTION Bilateral 06/10/2017    right breast prosthesis and capsule removal and bilateral breast tissue  expander reconstruction.  performed by [name], MD at [place]   ??? HX BREAST RECONSTRUCTION Left 07/09/2017    left breast  EXCISION and closure of wound performed by [name], MD at [place]   ???  HX BREAST RECONSTRUCTION Bilateral     Tissue expanders, to prepare for implants    ??? HX CESAREAN SECTION     ??? HX LUMBAR DISKECTOMY      herniated disc   ??? HX MASTECTOMY Right 2010    Total mastectomy per pt.     ??? HX MASTECTOMY Left 06/10/2017    left mastectomy,Sentinal node biopsies. performed by [name], MD at [place]   ??? HX MASTECTOMY Bilateral    ??? HX MASTOPEXY (BREAST LIFT) Left 2011   ??? IMPLANT BREAST SILICONE/EQ Right 1610    post mastectomy   ??? MULTIPLE DELIVERY C-SECTION      x2   ??? THUMB SUPPORT      thumb surgery to mobilize thumb       Family History:  Family History   Problem Relation Age of Onset   ??? Diabetes Mother    ??? No Known Problems Father        Social History:  Social History     Tobacco Use   ??? Smoking status: Current Every Day Smoker     Packs/day: 0.25   ??? Smokeless tobacco: Never Used   ??? Tobacco comment: 5-6 cigs per day   Substance Use Topics   ??? Alcohol use: No   ??? Drug use: No        Allergies:  Allergies   Allergen Reactions   ??? Tramadol Anaphylaxis         Review of Systems       Review of Systems   Constitutional: Negative for activity change, fatigue and fever.   HENT: Negative for congestion and rhinorrhea.    Eyes: Negative for visual disturbance.   Respiratory: Negative for shortness of breath.    Cardiovascular: Negative for chest pain and palpitations.   Gastrointestinal: Negative for abdominal pain, diarrhea, nausea and vomiting.   Genitourinary: Negative for dysuria and hematuria.        - incontinence   Musculoskeletal: Positive for back pain.   Skin: Negative for rash.   Neurological: Positive for headaches. Negative for dizziness, weakness, light-headedness and numbness.   Psychiatric/Behavioral: Negative for agitation.   [name] other systems reviewed and are negative.        Physical Exam     Visit Vitals  BP 154/84 (BP 1 Location: Left leg, BP Patient Position: At rest;Lying right side)   Pulse 71   Temp 98.2 ??F (36.8 ??C)   Resp 17   Ht 5\' 2"  (1.575 m)   Wt 62.6 kg (138 lb)   SpO2 97%   BMI 25.24 kg/m??         Physical Exam   Constitutional: She appears well-developed and well-nourished. No distress.   HENT:   Head: Normocephalic and atraumatic.   Right Ear: External ear normal.   Left Ear: External ear normal.   Nose: Nose normal.   Mouth/Throat: Oropharynx is clear and moist.   Ttp to frontal head   Eyes: Conjunctivae and EOM are normal. Pupils are equal, round, and reactive to light. No scleral icterus.   30mm pupils   Neck: Normal range of motion. Neck supple. No JVD present. No tracheal deviation present. No thyromegaly present.   Cardiovascular: Normal rate and regular rhythm. Exam reveals no friction rub.   No murmur heard.  Pulmonary/Chest: Effort normal and breath sounds normal. No stridor. She exhibits no tenderness.   Abdominal: Soft. Bowel sounds are normal. She exhibits no distension. There is no  tenderness. There is no rebound and no guarding.    Musculoskeletal: Normal range of motion. She exhibits no edema or tenderness.   Mid lumbar area post surgical changes from old incisions, well healed  Small band aid with no acute sign of infection, erythema, or edema. Only a spot for a small needle insertion site  No leakage of fluid   Lymphadenopathy:     She has no cervical adenopathy.   Neurological: She is alert. No cranial nerve deficit. Coordination normal.   Skin: Skin is warm and dry.   Psychiatric: She has a normal mood and affect. Her behavior is normal. Judgment and thought content normal.   Nursing note and vitals reviewed.        Diagnostic Study Results     Labs -  No results found for this or any previous visit (from the past 12 hour(s)).    Radiologic Studies -   No orders to display         Medical Decision Making     It should be noted that I, Meredith Pel, MD will be the provider of record for this patient.    I reviewed the vital signs, available nursing notes, past medical history, past surgical history, family history and social history.    Vital Signs-Reviewed the patient's vital signs.    Pulse Oximetry Analysis -  100% on room air (Interpretation) wnl    Cardiac Monitor:  Rate: 94    Records Reviewed: Nursing Notes (Time of Review: 10:04 AM)    ED Course: Progress Notes, Reevaluation, and Consults:    11:09 HA is improving, infusion going in, pt requesting something for her chronic pain.    13:30 Pt is pain free, no HA, has chronic back pain which pt knows how to treat herself, requesting to go home and if her HA returns she will come back. Knows to stay hydrated and f/u. Smiling in no distress.     Provider Notes (Medical Decision Making): Patient presents to emergency department with throbbing frontal, will had hurting especially getting up and moving around worse no neck stiffness that is acute there is chronic back pain no fever no upper respiratory symptoms no chest pain shortness of breath abdominal pain   Patient presents after myelogram 2 days ago that was done by IR in this hospital  This is for patient's chronic back pain with back surgery in the past and was prescribed by her orthopedic physician.  Patient has no new neurological deficits no new symptoms except the severe headache and patient has been dealing with her home home medications not working.  My exam was very nonspecific except for the head pain no neurological deficits were appreciated  I will start caffeine drip and have spoke with pharmacy, I will also start fluids aggressively to hydrate patient and symptomatically treatment for headache including Compazine  If patient has not improved I will discuss the case with interventional radiology for further management and assessment.  I discussed [name] this information with the patient and she is agreeable with the plan.              Diagnosis     Clinical Impression:   1. Intractable headache, unspecified chronicity pattern, unspecified headache type    2. Post lumbar puncture headache    3. Hx of spinal surgery    4. Chronic bilateral low back pain without sciatica      Disposition: Discharge     Follow-up Information  None             Medication List      ASK your doctor about these medications    DULoxetine 30 mg capsule  Commonly known as:  CYMBALTA  Take 1 Cap by mouth daily.     gabapentin 600 mg tablet  Commonly known as:  NEURONTIN  Take 1 Tab by mouth three (3) times daily.     insulin NPH/insulin regular 100 unit/mL (70-30) injection  Commonly known as:  NOVOLIN 70/30, HUMULIN 70/30  Administer 25 units subcut twice daily 30 minutes before breakfast and dinner     letrozole 2.5 mg tablet  Commonly known as:  FEMARA     LEUPROLIDE (59MONTH)-NORETHIND     PERCOCET 5-325 mg per tablet  Generic drug:  oxyCODONE-acetaminophen     ramipril 10 mg capsule  Commonly known as:  ALTACE  Take 1 Cap by mouth daily.     VENLAFAXINE PO          _______________________________       Scribe Arlana Hove acting as a scribe for and in the presence of Meredith Pel, MD      November 13, 2017 at 10:04 AM       Provider Attestation:      I personally performed the services described in the documentation, reviewed the documentation, as recorded by the scribe in my presence, and it accurately and completely records my words and actions. November 13, 2017 at 10:04 AM - Meredith Pel, MD        _______________________________

## 2017-11-13 NOTE — ED Triage Notes (Signed)
QUICK TRIAGE DONE

## 2017-11-17 ENCOUNTER — Ambulatory Visit: Attending: Physical Medicine & Rehabilitation | Primary: Nurse Practitioner

## 2017-11-17 ENCOUNTER — Ambulatory Visit
Admit: 2017-11-17 | Discharge: 2017-11-17 | Payer: PRIVATE HEALTH INSURANCE | Attending: Physical Medicine & Rehabilitation | Primary: Nurse Practitioner

## 2017-11-17 DIAGNOSIS — M5136 Other intervertebral disc degeneration, lumbar region: Secondary | ICD-10-CM

## 2017-11-17 MED ORDER — GABAPENTIN 600 MG TAB
600 mg | ORAL_TABLET | Freq: Three times a day (TID) | ORAL | 2 refills | Status: DC
Start: 2017-11-17 — End: 2018-02-23
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
## 2017-11-19 NOTE — Telephone Encounter (Signed)
Lvm requesting a phone call back, [facility] requested Okay to proceed with injection regarding blood sugar being controlled. Per Dr. Murlean Hark, pt needs to schedule appt. When pt return call, please schedule first available.

## 2017-11-20 NOTE — Telephone Encounter (Signed)
She is on 600 mg TID. I would not recommend increasing.

## 2017-11-20 NOTE — Telephone Encounter (Signed)
Pt seen 11/17/17 and forgot to ask if she could up her gabapentin.    Please call to advise. Pt [phone]

## 2017-11-20 NOTE — Telephone Encounter (Signed)
Patient is aware.

## 2017-11-20 NOTE — Telephone Encounter (Signed)
Called patient but did not get an answer. Will try again later.

## 2017-12-02 ENCOUNTER — Ambulatory Visit
Admit: 2017-12-02 | Discharge: 2017-12-02 | Payer: PRIVATE HEALTH INSURANCE | Attending: Internal Medicine | Primary: Nurse Practitioner

## 2017-12-02 DIAGNOSIS — IMO0002 Reserved for concepts with insufficient information to code with codable children: Secondary | ICD-10-CM

## 2017-12-02 LAB — AMB POC GLUCOSE BLOOD, BY GLUCOSE MONITORING DEVICE: Glucose POC: 221 mg/dL

## 2017-12-02 LAB — AMB POC HEMOGLOBIN A1C: Hemoglobin A1c (POC): 11.9 %

## 2017-12-02 MED ORDER — DULAGLUTIDE 1.5 MG/0.5 ML SUBCUTANEOUS PEN INJECTOR
1.5 mg/0.5 mL | PEN_INJECTOR | SUBCUTANEOUS | 3 refills | Status: DC
Start: 2017-12-02 — End: 2018-02-23

## 2017-12-02 MED ORDER — RAMIPRIL 10 MG CAP
10 mg | ORAL_CAPSULE | Freq: Every day | ORAL | 3 refills | Status: DC
Start: 2017-12-02 — End: 2018-02-23

## 2017-12-02 NOTE — Progress Notes (Signed)
Barbara [name] is a 52 y.o.  female and presents with     Chief Complaint   Patient presents with   ??? Diabetes   ??? Breast Cancer   ??? Back Pain     Pt is current;y administering Novolin 70/30 27 units twice daily. Pt has not been watching her diet.  Pt si supposed to get breast implants after surgery for breast cancer  Pt is seein Dr Christia Reading and is supposed to get steroid injections in her back  , however  Her sugars have been high due to non compliance with her diet.              Past Medical History:   Diagnosis Date   ??? Back pain    ??? Breast cancer ([place]) 2008 or 2009   ??? Diabetes ([place])    ??? FH: chemotherapy    ??? Radiation therapy complication 3086    NOT complication, just radiation     Past Surgical History:   Procedure Laterality Date   ??? BIOPSY BREAST     ??? BIOPSY FINE NEEDLE     ??? HX BREAST AUGMENTATION Right 2010   ??? HX BREAST AUGMENTATION Left 09/17/2017    left breast reconstruction with exchange of tissue expander per breast prostesis and left breast tissue expander performed by [name], MD at [facility]   ??? HX BREAST RECONSTRUCTION Right 2011    per pt - attempted latissimus dorsi flap, then saline implant   ??? HX BREAST RECONSTRUCTION Bilateral 06/10/2017    right breast prosthesis and capsule removal and bilateral breast tissue  expander reconstruction.  performed by [name], MD at [facility]   ??? HX BREAST RECONSTRUCTION Left 07/09/2017    left breast  EXCISION and closure of wound performed by [name], MD at [facility]   ??? HX BREAST RECONSTRUCTION Bilateral     Tissue expanders, to prepare for implants    ??? HX CESAREAN SECTION     ??? HX LUMBAR DISKECTOMY      herniated disc   ??? HX MASTECTOMY Right 2010    Total mastectomy per pt.     ??? HX MASTECTOMY Left 06/10/2017    left mastectomy,Sentinal node biopsies. performed by [name], MD at [facility]   ??? HX MASTECTOMY Bilateral    ??? HX MASTOPEXY (BREAST LIFT) Left 2011   ??? IMPLANT BREAST SILICONE/EQ Right 5784     post mastectomy   ??? MULTIPLE DELIVERY C-SECTION      x2   ??? THUMB SUPPORT      thumb surgery to mobilize thumb     Current Outpatient Medications   Medication Sig   ??? ramipril (ALTACE) 10 mg capsule Take 1 Cap by mouth daily.   ??? dulaglutide (TRULICITY) 1.5 ON/6.2 mL sub-q pen 0.5 mL by SubCUTAneous route every seven (7) days.   ??? gabapentin (NEURONTIN) 600 mg tablet Take 1 Tab by mouth three (3) times daily.   ??? aspirin-acetaminophen-caffeine (EXCEDRIN MIGRAINE) 250-250-65 mg per tablet Take 2 Tabs by mouth every six (6) hours as needed for Pain.   ??? DULoxetine (CYMBALTA) 30 mg capsule Take 1 Cap by mouth daily.   ??? venlafaxine HCl (VENLAFAXINE PO) Take  by mouth.   ??? gabapentin (NEURONTIN) 600 mg tablet Take 1 Tab by mouth three (3) times daily.   ??? oxyCODONE-acetaminophen (PERCOCET) 5-325 mg per tablet Take 1 Tab by mouth every four (4) hours as needed  for Pain.   ??? letrozole (FEMARA) 2.5 mg tablet Take 2.5 mg by mouth daily.   ??? leuprolide/norethindrone acet (LEUPROLIDE, 75MONTH,-NORETHIND) by Does Not Apply route every month.   ??? insulin NPH/insulin regular (NOVOLIN 70/30, HUMULIN 70/30) 100 unit/mL (70-30) injection Administer 25 units subcut twice daily 30 minutes before breakfast and dinner     No current facility-administered medications for this visit.      Health Maintenance   Topic Date Due   ??? FOOT EXAM Q1  11/27/1977   ??? PAP AKA CERVICAL CYTOLOGY  11/27/1988   ??? Influenza Age 59 to Adult  05/14/2017   ??? HEMOGLOBIN A1C Q6M  11/15/2017   ??? Shingrix Vaccine Age 33> (1 of 2) 11/27/2017   ??? FOBT Q 1 YEAR AGE 44-75  11/27/2017   ??? MICROALBUMIN Q1  01/29/2018   ??? LIPID PANEL Q1  05/15/2018   ??? EYE EXAM RETINAL OR DILATED  03/27/2019   ??? BREAST CANCER SCRN MAMMOGRAM  05/14/2019   ??? DTaP/Tdap/Td series (2 - Td) 02/04/2024   ??? Pneumococcal 19-64 Medium Risk  Completed     Immunization History   Administered Date(s) Administered   ??? Pneumococcal Polysaccharide (PPSV-23) 03/26/2017      No LMP recorded. Patient is perimenopausal.        Allergies and Intolerances:   Allergies   Allergen Reactions   ??? Tramadol Anaphylaxis       Family History:   Family History   Problem Relation Age of Onset   ??? Diabetes Mother    ??? No Known Problems Father        Social History:   She  reports that she has been smoking.  She has been smoking about 0.25 packs per day. she has never used smokeless tobacco.  She  reports that she does not drink alcohol.            Review of Systems:   General: negative for - chills, fatigue, fever, weight change  Psych: negative for - anxiety, depression, irritability or mood swings  ENT: negative for - headaches, hearing change, nasal congestion, oral lesions, sneezing or sore throat  Heme/ Lymph: negative for - bleeding problems, bruising, pallor or swollen lymph nodes  Endo: negative for - hot flashes, polydipsia/polyuria or temperature intolerance  Resp: negative for - cough, shortness of breath or wheezing  CV: negative for - chest pain, edema or palpitations  GI: negative for - abdominal pain, change in bowel habits, constipation, diarrhea or nausea/vomiting  GU: negative for - dysuria, hematuria, incontinence, pelvic pain or vulvar/vaginal symptoms  MSK: negative for - joint pain, joint swelling or muscle pain, pos for back pain  Neuro: negative for - confusion, headaches, seizures or weakness  Derm: negative for - dry skin, hair changes, rash or skin lesion changes          Physical:   Vitals:   Vitals:    12/02/17 1429   BP: 121/78   Pulse: 82   Resp: 18   Temp: 97.9 ??F (36.6 ??C)   TempSrc: Oral   SpO2: 98%   Weight: 135 lb (61.2 kg)   Height: 5\' 2"  (1.575 m)           Exam:   HEENT- atraumatic,normocephalic, awake, oriented, well nourished  Neck - supple,no enlarged lymph nodes, no JVD, no thyromegaly  Chest- CTA, no rhonchi, no crackles  Heart- rrr, no murmurs / gallop/rub  Abdomen- soft,BS+,NT, no hepatosplenomegaly  Ext - no c/c/edema  Neuro- no focal deficits.Power 5/5 all extremities  Skin - warm,dry, no obvious rashes.          Review of Data:   LABS:   Lab Results   Component Value Date/Time    WBC 17.2 (H) 09/18/2017 01:15 AM    HGB 15.4 09/18/2017 01:15 AM    HCT 43.7 09/18/2017 01:15 AM    PLATELET 264 09/18/2017 01:15 AM     Lab Results   Component Value Date/Time    Sodium 135 (L) 09/18/2017 01:15 AM    Potassium 3.8 09/18/2017 01:15 AM    Chloride 100 09/18/2017 01:15 AM    CO2 22 09/18/2017 01:15 AM    Glucose 241 (H) 09/18/2017 01:15 AM    BUN 9 09/18/2017 01:15 AM    Creatinine 0.46 (L) 09/18/2017 01:15 AM     Lab Results   Component Value Date/Time    Cholesterol, total 178 05/15/2017 08:32 AM    HDL Cholesterol 42 05/15/2017 08:32 AM    LDL, calculated 106 (H) 05/15/2017 08:32 AM    Triglyceride 149 05/15/2017 08:32 AM     No results found for: GPT        Impression / Plan:        ICD-10-CM ICD-9-CM    1. DM type 2, uncontrolled, with neuropathy (HCC) E11.40 250.62 AMB POC GLUCOSE BLOOD, BY GLUCOSE MONITORING DEVICE    E11.65 357.2 AMB POC HEMOGLOBIN A1C      dulaglutide (TRULICITY) 1.5 OA/4.1 mL sub-q pen   2. Essential hypertension I10 401.9 ramipril (ALTACE) 10 mg capsule         Monitor blood sugars, bring blood sugar log next visit.    NO h/o pancreatitis or thyroid  cancer    Increase insulin to 30 units twice daily    Explained to patient risk benefits of the medications.Advised patient to stop meds if having any side effects.Pt verbalized understanding of the instructions.    I have discussed the diagnosis with the patient and the intended plan as seen in the above orders.  The patient has received an after-visit summary and questions were answered concerning future plans.  I have discussed medication side effects and warnings with the patient as well. I have reviewed the plan of care with the patient, accepted their input and they are in agreement with the treatment goals.      Reviewed plan of care. Patient has provided input and agrees with goals.    Follow-up Disposition: Not on File    Elnora Morrison, MD

## 2017-12-02 NOTE — Progress Notes (Signed)
1. Have you been to the ER, urgent care clinic since your last visit?  Hospitalized since your last visit?No    2. Have you seen or consulted any other health care providers outside of the [facility] since your last visit?  Include any pap smears or colon screening. No

## 2017-12-03 NOTE — Telephone Encounter (Signed)
Pt can increase the insulin to 35 units twice daily.

## 2017-12-03 NOTE — Telephone Encounter (Signed)
Patient went to pick up trulicty yesterday but it cost $900 and the patient refused it. Requesting a cheaper medication. Please advise, thank you.

## 2017-12-04 NOTE — Telephone Encounter (Signed)
Nurse called patient, two patient identifiers used and confirmed by patient. Pt will increase insulin to 35 units daily.This encounter will be closed.

## 2017-12-08 NOTE — Telephone Encounter (Signed)
LVM regarding [facility] referral issue. Patient can no longer see Dr. Vella Kohler due to provider out of network with her insurance. I asked the patient to give me a call back to see if there is any other provider he would like to see.

## 2017-12-08 NOTE — Telephone Encounter (Signed)
Spoke to Anette and informed that referral cannot be retroactive and can only go back 6 days. I also informed her that per [facility] website Dr. Candi Leash is not in network. Ms. Wynonia Lawman stated that she will call back if she get things corrected/clarified regarding their credentialing when advised.

## 2017-12-08 NOTE — Telephone Encounter (Signed)
Anette from Dr. Vella Kohler office called requesting a referral to their office faxed over to: [phone]. I sent over the referral on file from 08/21/17 but they said it was wrong. They need the insurance referral. Please advise, thank you.

## 2017-12-09 NOTE — Telephone Encounter (Signed)
Annette from Dr. Jonne Ply office would like for you to give her a call at [phone].

## 2017-12-09 NOTE — Telephone Encounter (Signed)
Message noted.

## 2017-12-10 NOTE — Telephone Encounter (Signed)
Barbara [name] from [facility] stated to put Dr. Vella Kohler under physical medicine and rehab instead of Orthopedic. Authorization finally was able to be generated. Barbara [name] have been seeing Dr. Vella Kohler from the past. Authorization  [id number].       Pt was also notified that everything is completed that she should not have issue when it comes to authorization seeing Dr. Vella Kohler. Barbara [name] then stated that it is too far from where she lives that she already contacted her insurance to see if she could go to a different specialist that is closer. Barbara [name] was then informed that Dr. Vella Kohler may have different locations and contact his office to clarify so she doesn't have to switch. Barbara [name] was advised if she wants a different provider to see to just contact the office to generate new authorization.

## 2017-12-11 NOTE — Telephone Encounter (Signed)
Spoke with Rodena Piety, advised that Dr. Murlean Hark is no longer with the group, pt will need to be seen in order for order to be signed. Understanding verbalized, this encounter will be closed.

## 2017-12-11 NOTE — Telephone Encounter (Signed)
Barbara [name] from the brace doctor is asking the status of the orthotics paperwork that they sent on 12/10/17. Ms. Barbara [name] was informed that Dr. Murlean Hark is no longer in the practice but will still put the message in.  Asking to be called back

## 2017-12-22 ENCOUNTER — Encounter: Attending: Physical Medicine & Rehabilitation | Primary: Nurse Practitioner

## 2017-12-22 ENCOUNTER — Ambulatory Visit
Admit: 2017-12-22 | Discharge: 2017-12-22 | Payer: PRIVATE HEALTH INSURANCE | Attending: Internal Medicine | Primary: Nurse Practitioner

## 2017-12-22 DIAGNOSIS — IMO0002 Reserved for concepts with insufficient information to code with codable children: Secondary | ICD-10-CM

## 2017-12-22 LAB — AMB POC GLUCOSE BLOOD, BY GLUCOSE MONITORING DEVICE: Glucose POC: 358 mg/dL

## 2017-12-22 MED ORDER — SITAGLIPTIN 50 MG TAB
50 mg | ORAL_TABLET | Freq: Every day | ORAL | 0 refills | Status: DC
Start: 2017-12-22 — End: 2017-12-23

## 2017-12-22 MED ORDER — VENLAFAXINE SR 37.5 MG 24 HR CAP
37.5 mg | ORAL_CAPSULE | Freq: Every day | ORAL | 0 refills | Status: DC
Start: 2017-12-22 — End: 2018-02-23

## 2017-12-22 NOTE — Progress Notes (Addendum)
History of Present Illness  Barbara [name] is a 52 y.o. female who presents today for management of    Chief Complaint   Patient presents with   ??? Establish Care   ??? Diabetes       Patient is here to establish care. Previous PCP: Dr. Murlean Hark    She has history of bilateral breast cancer (right in 2009, left in 2018), s/p bilaterla mastectomy. She is currently on Femara. She is being followed by Dr. Jennye Boroughs.    Diabetes Mellitus:  She has diabetes mellitus, and  hypertension.  Diabetic ROS - medication compliance: compliant all of the time (has not started Tulicity due to cost), diabetic diet compliance: noncompliant some of the time, home glucose monitoring: fasting values range 200s, nonfasting values range 240-290s, further diabetic ROS: has dysesthesias in the feet.   Lab review: labs reviewed, I note that glycosylated hemoglobin abnormal 11.9%.     Cardiovascular Review:  The patient has hypertension.  Diet and Lifestyle: not attempting to follow a low fat, low cholesterol diet, not attempting to follow a low sodium diet, sedentary, nonsmoker  Home BP Monitoring: is not measured at home.  Pertinent ROS: taking medications as instructed, no medication side effects noted, no TIA's, no chest pain on exertion, no dyspnea on exertion, no swelling of ankles.     She complains of lumps on the palms of her hands. Onset was several years ago, gradually worsening since that time. It is associated with pain.      Past Medical History  Past Medical History:   Diagnosis Date   ??? Back pain    ??? Breast cancer ([place]) 2008 or 2009   ??? Diabetes ([place])    ??? FH: chemotherapy    ??? Radiation therapy complication 2202    NOT complication, just radiation      Surgical History  Past Surgical History:   Procedure Laterality Date   ??? BIOPSY BREAST     ??? BIOPSY FINE NEEDLE     ??? HX BREAST AUGMENTATION Right 2010   ??? HX BREAST AUGMENTATION Left 09/17/2017    left breast reconstruction with exchange of tissue expander per breast  prostesis and left breast tissue expander performed by [name], MD at [place]   ??? HX BREAST RECONSTRUCTION Right 2011    per pt - attempted latissimus dorsi flap, then saline implant   ??? HX BREAST RECONSTRUCTION Bilateral 06/10/2017    right breast prosthesis and capsule removal and bilateral breast tissue  expander reconstruction.  performed by [name], MD at [place]   ??? HX BREAST RECONSTRUCTION Left 07/09/2017    left breast  EXCISION and closure of wound performed by [name], MD at [place]   ??? HX BREAST RECONSTRUCTION Bilateral     Tissue expanders, to prepare for implants    ??? HX CESAREAN SECTION     ??? HX LUMBAR DISKECTOMY      herniated disc   ??? HX MASTECTOMY Right 2010    Total mastectomy per pt.     ??? HX MASTECTOMY Left 06/10/2017    left mastectomy,Sentinal node biopsies. performed by [name], MD at [place]   ??? HX MASTECTOMY Bilateral    ??? HX MASTOPEXY (BREAST LIFT) Left 2011   ??? IMPLANT BREAST SILICONE/EQ Right 5427    post mastectomy   ??? MULTIPLE DELIVERY C-SECTION      x2   ??? THUMB SUPPORT  thumb surgery to mobilize thumb      Current Medications  Current Outpatient Medications   Medication Sig   ??? SITagliptin (JANUVIA) 50 mg tablet Take 1 Tab by mouth daily.   ??? venlafaxine-SR (EFFEXOR-XR) 37.5 mg capsule Take 1 Cap by mouth daily.   ??? ramipril (ALTACE) 10 mg capsule Take 1 Cap by mouth daily.   ??? gabapentin (NEURONTIN) 600 mg tablet Take 1 Tab by mouth three (3) times daily.   ??? letrozole (FEMARA) 2.5 mg tablet Take 2.5 mg by mouth daily.   ??? insulin NPH/insulin regular (NOVOLIN 70/30, HUMULIN 70/30) 100 unit/mL (70-30) injection Administer 25 units subcut twice daily 30 minutes before breakfast and dinner (Patient taking differently: 30 Units by SubCUTAneous route two (2) times a day. Administer 25 units subcut twice daily 30 minutes before breakfast and dinner)   ??? dulaglutide (TRULICITY) 1.5 ZO/1.0 mL sub-q pen 0.5 mL by SubCUTAneous  route every seven (7) days.   ??? aspirin-acetaminophen-caffeine (EXCEDRIN MIGRAINE) 250-250-65 mg per tablet Take 2 Tabs by mouth every six (6) hours as needed for Pain.     No current facility-administered medications for this visit.        Allergies/Drug Reactions  Allergies   Allergen Reactions   ??? Tramadol Anaphylaxis   ??? Metformin Diarrhea        Family History  Family History   Problem Relation Age of Onset   ??? Diabetes Mother    ??? No Known Problems Father         Social History  Social History     Socioeconomic History   ??? Marital status: MARRIED     Spouse name: Not on file   ??? Number of children: Not on file   ??? Years of education: Not on file   ??? Highest education level: Not on file   Social Needs   ??? Financial resource strain: Not on file   ??? Food insecurity - worry: Not on file   ??? Food insecurity - inability: Not on file   ??? Transportation needs - medical: Not on file   ??? Transportation needs - non-medical: Not on file   Occupational History   ??? Not on file   Tobacco Use   ??? Smoking status: Current Every Day Smoker     Packs/day: 0.25   ??? Smokeless tobacco: Never Used   ??? Tobacco comment: 5-6 cigs per day   Substance and Sexual Activity   ??? Alcohol use: No   ??? Drug use: No   ??? Sexual activity: Not on file   Other Topics Concern   ??? Military Service Not Asked   ??? Blood Transfusions Not Asked   ??? Caffeine Concern Not Asked   ??? Occupational Exposure Not Asked   ??? Hobby Hazards Not Asked   ??? Sleep Concern Not Asked   ??? Stress Concern Not Asked   ??? Weight Concern Not Asked   ??? Special Diet Not Asked   ??? Back Care Not Asked   ??? Exercise Not Asked   ??? Bike Helmet Not Asked   ??? Seat Belt Not Asked   ??? Self-Exams Not Asked   Social History Narrative   ??? Not on file       Health Maintenance   Topic Date Due   ??? FOOT EXAM Q1  11/27/1977   ??? PAP AKA CERVICAL CYTOLOGY  11/27/1988   ??? Influenza Age 58 to Adult  05/14/2017   ??? Shingrix Vaccine Age 37> (1  of 2) 11/27/2017   ??? FOBT Q 1 YEAR AGE 106-75  11/27/2017    ??? MICROALBUMIN Q1  01/29/2018   ??? LIPID PANEL Q1  05/15/2018   ??? HEMOGLOBIN A1C Q6M  06/01/2018   ??? EYE EXAM RETINAL OR DILATED  03/27/2019   ??? BREAST CANCER SCRN MAMMOGRAM  05/14/2019   ??? DTaP/Tdap/Td series (2 - Td) 02/04/2024   ??? Pneumococcal 19-64 Medium Risk  Completed     Immunization History   Administered Date(s) Administered   ??? Pneumococcal Polysaccharide (PPSV-23) 03/26/2017   ??? Tdap 02/03/2014       Review of Systems  Negative except as mentioned in HPI    Physical Exam  Vital signs:   Vitals:    12/22/17 1416   BP: 129/75   Pulse: 93   Resp: 20   Temp: 98.5 ??F (36.9 ??C)   TempSrc: Oral   SpO2: 95%   Weight: 138 lb 12.8 oz (63 kg)   Height: 5' 2"  (1.575 m)       General: alert, oriented, not in distress  Head: scalp normal, atraumatic  Eyes: pupils are equal and reactive, full and intact EOM's  Lips/Mouth: moist lips and buccal mucosa, non-enlarged tonsils, pink throat  Neck: supple, no JVD, no lymphadenopathy, non-palpable thyroid  Chest/Lungs: clear breath sounds, no wheezing or crackles  Heart: normal rate, regular rhythm, no murmur  Extremities: (+) firm, round, non-movable lumps on both palms, no focal deformities, no edema  Skin: no active skin lesions      Laboratory/Tests:  Component      Latest Ref Rng & Units 12/22/2017 12/02/2017 09/18/2017 09/18/2017           2:20 PM  2:35 AM  1:15 AM  1:15 AM   WBC      4.6 - 13.2 K/uL    17.2 (H)   RBC      4.20 - 5.30 M/uL    4.87   HGB      12.0 - 16.0 g/dL    15.4   HCT      35.0 - 45.0 %    43.7   MCV      74.0 - 97.0 FL    89.7   MCH      24.0 - 34.0 PG    31.6   MCHC      31.0 - 37.0 g/dL    35.2   RDW      11.6 - 14.5 %    12.2   PLATELET      135 - 420 K/uL    264   MPV      9.2 - 11.8 FL    10.3   NEUTROPHILS      40 - 73 %    77 (H)   LYMPHOCYTES      21 - 52 %    17 (L)   MONOCYTES      3 - 10 %    6   EOSINOPHILS      0 - 5 %    0   BASOPHILS      0 - 2 %    0   ABS. NEUTROPHILS      1.8 - 8.0 K/UL    13.3 (H)   ABS. LYMPHOCYTES       0.9 - 3.6 K/UL    2.9   ABS. MONOCYTES      0.05 - 1.2 K/UL    1.0  ABS. EOSINOPHILS      0.0 - 0.4 K/UL    0.0   ABS. BASOPHILS      0.0 - 0.1 K/UL    0.0   DF          AUTOMATED   Sodium      136 - 145 mmol/L   135 (L)    Potassium      3.5 - 5.5 mmol/L   3.8    Chloride      100 - 108 mmol/L   100    CO2      21 - 32 mmol/L   22    Anion gap      3.0 - 18 mmol/L   13    Glucose      74 - 99 mg/dL   241 (H)    BUN      7.0 - 18 MG/DL   9    Creatinine      0.6 - 1.3 MG/DL   0.46 (L)    BUN/Creatinine ratio      12 - 20     20    GFR est AA      >60 ml/min/1.49m   >60    GFR est non-AA      >60 ml/min/1.772m  >60    Calcium      8.5 - 10.1 MG/DL   9.4    Bilirubin, total      0.2 - 1.0 MG/DL   0.7    ALT (SGPT)      13 - 56 U/L   34    AST      15 - 37 U/L   23    Alk. phosphatase      45 - 117 U/L   153 (H)    Protein, total      6.4 - 8.2 g/dL   7.5    Albumin      3.4 - 5.0 g/dL   3.6    Globulin      2.0 - 4.0 g/dL   3.9    A-G Ratio      0.8 - 1.7     0.9    Hemoglobin A1c (POC)      %  11.9     GLUCOSE,FAST - POC      mg/dL 358        Component      Latest Ref Rng & Units 05/15/2017           8:32 AM   Triglyceride      40 - 149 mg/dL 149   HDL Cholesterol      40 - 59 mg/dL 42   Cholesterol, total      110 - 200 mg/dL 178   CHOLESTEROL/HDL      0.0 - 5.0 4.2   LDL, calculated      50 - 99 mg/dL 106 (H)   VLDL, calculated      8 - 30 mg/dL 30     Component      Latest Ref Rng & Units 01/29/2017           1:30 PM   Microalbumin,urine random      0 - 3.0 MG/DL 2.30   Creatinine, urine      30 - 125 mg/dL 31.31   Microalbumin/Creat. Ratio      0 - 30 mg/g 73 (H)       Assessment/Plan:    1.  DM type 2, uncontrolled, with neuropathy ([place])  - AMB POC GLUCOSE BLOOD, BY GLUCOSE MONITORING DEVICE    2. Type 2 diabetes mellitus with microalbuminuria, with long-term current use of insulin ([facility])  - poorly controlled due to poor compliance with meds   - start SITagliptin (JANUVIA) 50 mg tablet; Take 1 Tab by mouth daily.  Dispense: 90 Tab; Refill: 0  - continue Novolin 70/30 30 units BID  - Patient will check if Trulicity coupon card will be accepted by her insurance  - stressed importance of diet and exercise    3. Essential hypertension  - controlled  - continue ramipril    4. History of breast cancer in female  - on Femara   - follow-up with breast surgeon    5. Hot flashes due to menopause  - start venlafaxine-SR (EFFEXOR-XR) 37.5 mg capsule; Take 1 Cap by mouth daily.  Dispense: 90 Cap; Refill: 0    6. Cigarette nicotine dependence without complication  - contemplative stage with respect to tobacco use.  I advised patient to quit, and offered support.    7. Mass of hand, bilateral, Dupuytren contracture  - REFERRAL TO HAND SURGERY      Follow-up Disposition:  Return in about 2 months (around 02/21/2018) for ROV.      I have discussed the diagnosis with the patient and the intended plan as seen in the above orders.  The patient has received an after-visit summary and questions were answered concerning future plans.  I have discussed medication side effects and warnings with the patient as well. I have reviewed the plan of care with the patient, accepted their input and they are in agreement with the treatment goals.       Karen Kays, MD  December 22, 2017

## 2017-12-22 NOTE — Progress Notes (Signed)
1. Have you been to the ER, urgent care clinic since your last visit?  Hospitalized since your last visit?No    2. Have you seen or consulted any other health care providers outside of the [facility] since your last visit?  Include any pap smears or colon screening. No

## 2017-12-23 ENCOUNTER — Encounter

## 2017-12-23 MED ORDER — SITAGLIPTIN 50 MG TAB
50 mg | ORAL_TABLET | Freq: Every day | ORAL | 0 refills | Status: DC
Start: 2017-12-23 — End: 2018-01-01

## 2017-12-23 NOTE — Telephone Encounter (Signed)
Form will be sent to provider for review. This encounter will be closed.

## 2017-12-23 NOTE — Telephone Encounter (Signed)
Barbara [name] faxed over forms to be completed and return for orthotics. She has an appointment 12/26/17

## 2017-12-25 NOTE — Telephone Encounter (Signed)
[employer] is reviewing your PA request. Typically an electronic response will be received within 72 hours. To check for an update later, open this request from your dashboard.

## 2017-12-29 ENCOUNTER — Telehealth

## 2017-12-29 DIAGNOSIS — M542 Cervicalgia: Secondary | ICD-10-CM

## 2017-12-29 NOTE — Telephone Encounter (Signed)
Pharmacy told her prior Josem Kaufmann is required.

## 2017-12-29 NOTE — ED Triage Notes (Signed)
Pt ambulatory into triage for c/o left sided posterior headache x2 days (denies N/V, denies light/sound sensitivity) and left earache that began around 1700 today. Pt did not take any medication for pain.

## 2017-12-30 ENCOUNTER — Inpatient Hospital Stay
Admit: 2017-12-30 | Discharge: 2017-12-30 | Disposition: A | Discharge status: Home / Self Care | Payer: PRIVATE HEALTH INSURANCE | Attending: Emergency Medicine

## 2017-12-30 MED ORDER — LIDOCAINE 4 % TOPICAL PATCH (12 HOUR DURATION)
4 % | CUTANEOUS | Status: DC
Start: 2017-12-30 — End: 2017-12-30

## 2017-12-30 MED ORDER — IBUPROFEN 600 MG TAB
600 mg | ORAL | Status: AC
Start: 2017-12-30 — End: 2017-12-30
  Administered 2017-12-30: 04:00:00 via ORAL

## 2017-12-30 MED ORDER — HYDROCODONE-ACETAMINOPHEN 5 MG-325 MG TAB
5-325 mg | ORAL | Status: AC
Start: 2017-12-30 — End: 2017-12-30
  Administered 2017-12-30: 04:00:00 via ORAL

## 2017-12-30 MED ORDER — IBUPROFEN 600 MG TAB
600 mg | ORAL_TABLET | Freq: Four times a day (QID) | ORAL | 0 refills | Status: DC | PRN
Start: 2017-12-30 — End: 2018-03-31

## 2017-12-30 MED ORDER — HYDROCODONE-ACETAMINOPHEN 5 MG-325 MG TAB
5-325 mg | ORAL_TABLET | Freq: Four times a day (QID) | ORAL | 0 refills | Status: AC | PRN
Start: 2017-12-30 — End: 2018-01-02

## 2017-12-30 MED ORDER — LIDOCAINE 5 % (700 MG/PATCH) ADHESIVE PATCH
5 % | CUTANEOUS | 0 refills | Status: DC
Start: 2017-12-30 — End: 2018-03-31

## 2017-12-30 MED ORDER — AMOXICILLIN 500 MG TABLET
500 mg | ORAL_TABLET | Freq: Three times a day (TID) | ORAL | 0 refills | Status: AC
Start: 2017-12-30 — End: 2018-01-06

## 2017-12-30 MED FILL — IBUPROFEN 600 MG TAB: 600 mg | ORAL | Qty: 1

## 2017-12-30 MED FILL — LIDOCAINE 4 % TOPICAL PATCH (8 HOUR DURATION): 4 % | CUTANEOUS | Qty: 1

## 2017-12-30 MED FILL — HYDROCODONE-ACETAMINOPHEN 5 MG-325 MG TAB: 5-325 mg | ORAL | Qty: 1

## 2017-12-30 NOTE — Telephone Encounter (Signed)
For which medication?

## 2017-12-30 NOTE — ED Notes (Signed)
12:27 AM  12/30/17     Discharge instructions given to patient (name) with verbalization of understanding. Patient accompanied by daughter.  Patient discharged with the following prescriptions Lidocaine, Norco, Ibuprofen. Patient discharged to home (destination).      Willis Modena, RN

## 2017-12-30 NOTE — ED Provider Notes (Signed)
Barbara [name] is a 52 y.o. Female with noted past medical history who states she started having left posterior headache and neck pain that she woke up with approximately 2 days ago.  Patient also complained of left ear pain today along with left upper posterior dental pain.  Patient denies any significant decrease in her hearing, ear drainage, significant congestion or sinus pressure.  She has worsening pain in her tooth with chewing.  She has no facial swelling.  She has no fevers chills nauseous vomiting or diarrhea.  She has not taken anything for the pain today.  He has no weakness, numbness, difficulty swallowing or issues with her speech.      The history is provided by the patient.        Past Medical History:   Diagnosis Date   ??? Back pain    ??? Breast cancer ([place]) 2008 or 2009   ??? Diabetes ([place])    ??? FH: chemotherapy    ??? Radiation therapy complication 6578    NOT complication, just radiation       Past Surgical History:   Procedure Laterality Date   ??? BIOPSY BREAST     ??? BIOPSY FINE NEEDLE     ??? HX BREAST AUGMENTATION Right 2010   ??? HX BREAST AUGMENTATION Left 09/17/2017    left breast reconstruction with exchange of tissue expander per breast prostesis and left breast tissue expander performed by [name], MD at [place]   ??? HX BREAST RECONSTRUCTION Right 2011    per pt - attempted latissimus dorsi flap, then saline implant   ??? HX BREAST RECONSTRUCTION Bilateral 06/10/2017    right breast prosthesis and capsule removal and bilateral breast tissue  expander reconstruction.  performed by [name], MD at [place]   ??? HX BREAST RECONSTRUCTION Left 07/09/2017    left breast  EXCISION and closure of wound performed by [name], MD at [place]   ??? HX BREAST RECONSTRUCTION Bilateral     Tissue expanders, to prepare for implants    ??? HX CESAREAN SECTION     ??? HX LUMBAR DISKECTOMY      herniated disc   ??? HX MASTECTOMY Right 2010    Total mastectomy per pt.      ??? HX MASTECTOMY Left 06/10/2017    left mastectomy,Sentinal node biopsies. performed by [name], MD at [place]   ??? HX MASTECTOMY Bilateral    ??? HX MASTOPEXY (BREAST LIFT) Left 2011   ??? IMPLANT BREAST SILICONE/EQ Right 4696    post mastectomy   ??? MULTIPLE DELIVERY C-SECTION      x2   ??? THUMB SUPPORT      thumb surgery to mobilize thumb         Family History:   Problem Relation Age of Onset   ??? Diabetes Mother    ??? No Known Problems Father        Social History     Socioeconomic History   ??? Marital status: MARRIED     Spouse name: Not on file   ??? Number of children: Not on file   ??? Years of education: Not on file   ??? Highest education level: Not on file   Social Needs   ??? Financial resource strain: Not on file   ??? Food insecurity - worry: Not on file   ??? Food insecurity - inability: Not on file   ??? Transportation needs - medical:  Not on file   ??? Transportation needs - non-medical: Not on file   Occupational History   ??? Not on file   Tobacco Use   ??? Smoking status: Current Every Day Smoker     Packs/day: 0.25   ??? Smokeless tobacco: Never Used   ??? Tobacco comment: 5-6 cigs per day   Substance and Sexual Activity   ??? Alcohol use: No   ??? Drug use: No   ??? Sexual activity: Not on file   Other Topics Concern   ??? Military Service Not Asked   ??? Blood Transfusions Not Asked   ??? Caffeine Concern Not Asked   ??? Occupational Exposure Not Asked   ??? Hobby Hazards Not Asked   ??? Sleep Concern Not Asked   ??? Stress Concern Not Asked   ??? Weight Concern Not Asked   ??? Special Diet Not Asked   ??? Back Care Not Asked   ??? Exercise Not Asked   ??? Bike Helmet Not Asked   ??? Seat Belt Not Asked   ??? Self-Exams Not Asked   Social History Narrative   ??? Not on file         ALLERGIES: Tramadol and Metformin    Review of Systems   Constitutional: Negative for fever.   HENT: Positive for dental problem and ear pain. Negative for congestion, drooling, facial swelling, hearing loss, mouth sores, nosebleeds, sore  throat, tinnitus, trouble swallowing and voice change.    Eyes: Negative for visual disturbance.   Respiratory: Negative for cough and shortness of breath.    Gastrointestinal: Negative for abdominal pain.   Musculoskeletal: Negative for gait problem.   Skin: Negative for rash.   Allergic/Immunologic: Negative for immunocompromised state.   Neurological: Negative for dizziness, syncope, light-headedness and numbness.   Psychiatric/Behavioral: Positive for sleep disturbance.       Vitals:    12/29/17 2336   BP: (!) 154/101   Pulse: 77   Resp: 14   Temp: 99.7 ??F (37.6 ??C)   SpO2: 99%   Weight: 62.6 kg (138 lb)   Height: 5\' 2"  (1.575 m)            Physical Exam   Constitutional: She is oriented to person, place, and time. She appears well-developed and well-nourished. She appears distressed.   Appears uncomfortable but not ill.   HENT:   Head: Normocephalic and atraumatic.   Right Ear: External ear normal. No mastoid tenderness. Tympanic membrane is scarred. Tympanic membrane is not injected, not perforated, not erythematous, not retracted and not bulging. No hemotympanum.   Left Ear: External ear normal. No mastoid tenderness. Tympanic membrane is scarred. Tympanic membrane is not injected, not perforated, not erythematous, not retracted and not bulging.  No middle ear effusion. No hemotympanum.   Nose: Nose normal.   Mouth/Throat: Uvula is midline, oropharynx is clear and moist and mucous membranes are normal. No oral lesions. Abnormal dentition. Dental caries present. No dental abscesses or uvula swelling. No oropharyngeal exudate, posterior oropharyngeal edema or posterior oropharyngeal erythema. Tonsils are 0 on the right. Tonsils are 0 on the left. No tonsillar exudate.       Eyes: Conjunctivae are normal. No scleral icterus.   Neck: Neck supple.   Cardiovascular: Normal rate, regular rhythm, normal heart sounds and intact distal pulses.   Pulmonary/Chest: Effort normal and breath sounds normal.    Abdominal: Soft. There is no tenderness.   Musculoskeletal: She exhibits no edema.   Neurological: She is alert and  oriented to person, place, and time. Gait normal.   Skin: Skin is warm and dry. She is not diaphoretic.   Psychiatric: Her behavior is normal.   Nursing note and vitals reviewed.       MDM       Procedures    Vitals:  Patient Vitals for the past 12 hrs:   Temp Pulse Resp BP SpO2   12/29/17 2336 99.7 ??F (37.6 ??C) 77 14 (!) 154/101 99 %       Medications ordered:   Medications   lidocaine 4 % patch 1 Patch (1 Patch TransDERmal Apply Patch 12/30/17 0018)   HYDROcodone-acetaminophen (NORCO) 5-325 mg per tablet 1 Tab (1 Tab Oral Given 12/30/17 0018)   ibuprofen (MOTRIN) tablet 600 mg (600 mg Oral Given 12/30/17 0018)       Lab findings:  No results found for this or any previous visit (from the past 12 hour(s)).    EKG interpretation by ED Physician:    X-Ray, CT or other radiology findings or impressions:  No orders to display       Progress notes, Consult notes or additional Procedure notes:   Doubt need for any imaging or other workup here.  I suspect there are 2 separate issues.  Patient likely has a nerve impingement in her left posterior neck and a possible middle ear effusion or secondary referred pain from a dental related issue.  There is no frank dental abscess.  I have discussed with patient and/or family/sig other the results, interpretation of any imaging if performed, suspected diagnosis and treatment plan to include instructions regarding the diagnoses listed to which understanding was expressed with all questions answered      Reevaluation of patient:   stable  Disposition:  Diagnosis:   1. Posterior neck pain    2. Left ear pain    3. Pain, dental        Disposition: home    Follow-up Information     Follow up With Specialties Details Why Contact Info    [place], Nunzio Cory, MD Internal Medicine Schedule an appointment as soon as possible for a visit  [address]  [place]   [place] [address]  [place]  Schedule an appointment as soon as possible for a visit for your tooth or with dentist of your choosing [facility]    [facility] EMERGENCY DEPT Emergency Medicine  If symptoms worsen [place]  [phone]               Medication List      START taking these medications    amoxicillin 500 mg Tab  Take 500 mg by mouth three (3) times daily for 7 days.     HYDROcodone-acetaminophen 5-325 mg per tablet  Commonly known as:  NORCO  Take 1 Tab by mouth every six (6) hours as needed for Pain for up to 3 days. Max Daily Amount: 4 Tabs.     ibuprofen 600 mg tablet  Commonly known as:  MOTRIN  Take 1 Tab by mouth every six (6) hours as needed for Pain.     lidocaine 5 %  Commonly known as:  LIDODERM  Apply patch to the affected area for 12 hours a day and remove for 12 hours a day.        STOP taking these medications    aspirin-acetaminophen-caffeine  250-250-65 mg per tablet  Commonly known as:  [place] your doctor about these medications    aspirin delayed-release 81 mg tablet     dulaglutide 1.5 mg/0.5 mL sub-q pen  Commonly known as:  TRULICITY  0.5 mL by SubCUTAneous route every seven (7) days.     gabapentin 600 mg tablet  Commonly known as:  NEURONTIN  Take 1 Tab by mouth three (3) times daily.     insulin NPH/insulin regular 100 unit/mL (70-30) injection  Commonly known as:  NOVOLIN 70/30, HUMULIN 70/30  Administer 25 units subcut twice daily 30 minutes before breakfast and dinner     letrozole 2.5 mg tablet  Commonly known as:  FEMARA     ramipril 10 mg capsule  Commonly known as:  ALTACE  Take 1 Cap by mouth daily.     SITagliptin 50 mg tablet  Commonly known as:  JANUVIA  Take 1 Tab by mouth daily.     venlafaxine-SR 37.5 mg capsule  Commonly known as:  EFFEXOR-XR  Take 1 Cap by mouth daily.           Where to Get Your Medications       Information about where to get these medications is not yet available    Ask your nurse or doctor about these medications  ?? amoxicillin 500 mg Tab  ?? HYDROcodone-acetaminophen 5-325 mg per tablet  ?? ibuprofen 600 mg tablet  ?? lidocaine 5 %

## 2017-12-31 NOTE — Telephone Encounter (Signed)
Called patient, Barbara [name] is the medication that needs prior auth. Prior auth initiated with [facility] on 12/25/17     [place] listed in patient's chart to see if medication was able to go through. Was placed on hold for >>6 minutes.    [facility], [facility], @ [phone]-- was then transferred to [place] [phone] with [facility].     Patient must try and fail one of the following medications for 3 months:   linagliptin (Tradjenta)  saxagliptin (Onglyza)

## 2018-01-01 MED ORDER — SAXAGLIPTIN 5 MG TAB
5 mg | ORAL_TABLET | Freq: Every day | ORAL | 0 refills | Status: DC
Start: 2018-01-01 — End: 2018-02-23

## 2018-01-01 NOTE — Telephone Encounter (Signed)
Changed to Onglyza

## 2018-01-08 NOTE — Telephone Encounter (Signed)
Patient called stating the trulicity is working wonderful for her and her sugar stays between 130-150. Documentation only. Thank you.

## 2018-01-08 NOTE — Telephone Encounter (Signed)
Sent to provider as FYI.

## 2018-01-19 ENCOUNTER — Ambulatory Visit
Admit: 2018-01-19 | Discharge: 2018-01-19 | Payer: PRIVATE HEALTH INSURANCE | Attending: Surgery | Primary: Nurse Practitioner

## 2018-01-19 DIAGNOSIS — Z170 Estrogen receptor positive status [ER+]: Secondary | ICD-10-CM

## 2018-01-19 NOTE — Progress Notes (Signed)
1. Have you been to the ER, urgent care clinic since your last visit?  Hospitalized since your last visit?Yes Left knee xray    2. Have you seen or consulted any other health care providers outside of the [facility] since your last visit?  Include any pap smears or colon screening. Yes Dr. Brent General     Patient presents for follow up from left mastectomy on 09/17/17.

## 2018-01-19 NOTE — Progress Notes (Signed)
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 Ronnald Ramp 06/10/17 as well as a history of right breast cancer in 2008.  Her Oncotype returned as 13 with an 8% 5 year recurrence with Tamoxifen alone. She has been started on ovarian suppression and aromatase inhibitor (Lupron and Femara) per Dr. Brent General.     She presented August 2018  for evaluation regarding a new diagnosis of a left breast cancer.  The patient had a recent left breast mammogram that showed an area of calcifications and possibly small mass in about the 1130 position 3 cm from the nipple measuring about 1.8 cm.  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  The tumor was estrogen and progesterone receptor positive, HER-2 negative.    Of note in 2008 the patient developed right breast cancer which apparently was fairly large.  She believes she had gene testing at that time when she was 52 years old.  She underwent a mastectomy and had neoadjuvant chemotherapy.  Subsequently she had radiation therapy.  She apparently had some latissimus dorsi flap reconstruction but this failed and then subsequent to that she had an implant.  Subsequent to that she had a reduction mammoplasty of the left breast.  She has not been aware of any recurrent disease since her first cancer until the development of this new cancer in the other breast.  She states a pill was not recommended at that time.    She underwent revision of scar with excision of necrotic tissue and removal of expander by Dr. Ronnald Ramp 07/09/17 and subsequent submuscular placement 09/17/17.  She is extremely pleased with her result.  She is taking Femara and Lupron per Dr. Brent General. She is overall doing well. She  reports decrease in appetite which she attributes to new diabetes medication.          Allergies   Allergen Reactions   ??? Tramadol Anaphylaxis   ??? Metformin Diarrhea     Past Medical History:   Diagnosis Date   ??? Back pain    ??? Breast cancer ([place]) 2008 or 2009   ??? Diabetes ([place])    ??? FH: chemotherapy    ??? Radiation therapy complication 8469    NOT complication, just radiation     Past Surgical History:   Procedure Laterality Date   ??? BIOPSY BREAST     ??? BIOPSY FINE NEEDLE     ??? HX BREAST AUGMENTATION Right 2010   ??? HX BREAST AUGMENTATION Left 09/17/2017    left breast reconstruction with exchange of tissue expander per breast prostesis and left breast tissue expander performed by [name], MD at [place]   ??? HX BREAST RECONSTRUCTION Right 2011    per pt - attempted latissimus dorsi flap, then saline implant   ??? HX BREAST RECONSTRUCTION Bilateral 06/10/2017    right breast prosthesis and capsule removal and bilateral breast tissue  expander reconstruction.  performed by [name], MD at [place]   ??? HX BREAST RECONSTRUCTION Left 07/09/2017    left breast  EXCISION and closure of wound performed by [name], MD at [place]   ??? HX BREAST RECONSTRUCTION Bilateral     Tissue expanders, to prepare for implants    ???  HX CESAREAN SECTION     ??? HX LUMBAR DISKECTOMY      herniated disc   ??? HX MASTECTOMY Right 2010    Total mastectomy per pt.     ??? HX MASTECTOMY Left 06/10/2017    left mastectomy,Sentinal node biopsies. performed by [name], MD at [place]   ??? HX MASTECTOMY Bilateral    ??? HX MASTOPEXY (BREAST LIFT) Left 2011   ??? IMPLANT BREAST SILICONE/EQ Right 2202    post mastectomy   ??? MULTIPLE DELIVERY C-SECTION      x2   ??? THUMB SUPPORT      thumb surgery to mobilize thumb     Social History     Socioeconomic History   ??? Marital status: MARRIED     Spouse name: Not on file   ??? Number of children: Not on file   ??? Years of education: Not on file    ??? Highest education level: Not on file   Tobacco Use   ??? Smoking status: Current Every Day Smoker     Packs/day: 0.25   ??? Smokeless tobacco: Never Used   ??? Tobacco comment: 5-6 cigs per day   Substance and Sexual Activity   ??? Alcohol use: No   ??? Drug use: No   Other Topics Concern     REVIEW OF SYSTEMS     Constitutional: No fever, weight loss, fatigue or recent chills.   Skin:  No recent rashes, dermatitis or abnormal moles.   HEENT:  No changes in vision, vertigo, epistaxis, dysphasia, or hoarseness.   Cardiac:  No chest pain, palpitations, or edema.    Respiratory: No chronic cough, shortness of breath, wheezing, hemoptysis, or history of sleep apnea.  Patient has been a longtime smoker and still smokes about 1 pack of cigarettes per day-probable mild COPD   Breasts/GYN:   See the history of present illness   Gastrointestinal:  No significant food intolerances, no recent vomiting, no chronic abdominal pain, no change in bowel habits, no melena. No history of GERD.   Genitourinary:  No history of hematuria, dysuria, frequency, or stress urinary incontinence. No nocturia.   Musculoskeletal: No weakness, joint pains, or arthritis.  Patient does have a history of chronic back pain   Endocrine:  No history of thyroid disease.  History of insulin-dependent diabetes with neuropathy.  Patient's previous hemoglobin A1c was over 8   Lymph/hemo:  No history of blood transfusions or easy bruising. No anemia.  Patient states that she had "blood clots" with previous surgeries but she pointed to her chest   Neuro: No dizziness or headaches or fainting.  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    PHYSICAL EXAM    Visit Vitals  BP (!) 129/91 (BP 1 Location: Left arm, BP Patient Position: Sitting)   Pulse 80   Temp 98.4 ??F (36.9 ??C) (Oral)   Resp 16   Ht 5' 2"  (1.575 m)   Wt 60.8 kg (134 lb)   SpO2 97%   BMI 24.51 kg/m??          Constitutional:  Well-developed, well-nourished, no acute distress.     Breasts:    Right: Examined in both the supine and upright positions.  There was no supraclavicular, infraclavicular, or axillary lympadenopathy.   There were no dominant masses, no skin changes, no asymmetry identified s/p mastectomy, well healed scars, tissue expanders bilaterally  Left: Examined in both the supine and upright positions.  There was  no supraclavicular, infraclavicular, or axillary lympadenopathy.   There were no dominant masses, no skin changes, no asymmetry identified s/p mastectomy, well healed scars, tissue expanders bilaterally         Pathology:  06/10/17   A: SENTINEL LYMPH NODE #1, LEFT AXILLA, BIOPSY:   ONE LYMPH NODE, NEGATIVE FOR MALIGNANCY (0/1).   B: SENTINEL LYMPH NODE #2, LEFT AXILLA, BIOPSY:   ONE LYMPH NODE, NEGATIVE FOR MALIGNANCY (0/1).   C: LEFT BREAST, SIMPLE MASTECTOMY:   INVASIVE CARCINOMA OF THE BREAST.   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   Nuclear Grade: Grade II (intermediate)   Lobular Carcinoma In Situ (LCIS): Present   Tumor Extent   Nipple DCIS: DCIS does not involve the nipple epidermis   Accessory Findings   Treatment Effect in the Breast: No known presurgical therapy   Treatment Effect in the Lymph Nodes: No known presurgical therapy   MARGINS   Invasive Carcinoma Margins: Uninvolved by invasive carcinoma   Distance from Closest Margin in Millimeters: Distance is > 10 Millimeters   Closest Margin: Posterior   DCIS Margins: Uninvolved by DCIS   Distance of DCIS from Closest Margin in Millimeters: Distance is > 10 Millimeters   Closest Margin: Posterior   LYMPH NODES    Number of Lymph Nodes with Macrometastases (> 2 mm): 0   Number of Lymph Nodes with Micrometastases (> 0.2 mm to 2 mm and / or > 200 cells): 0   Number of Lymph Nodes with Isolated Tumor Cells (<= 0.2 mm and <= 200 cells): 0   Number of Lymph Nodes Examined: 2 (see above)   Number of Sentinel Nodes Examined: 2   PATHOLOGIC STAGE CLASSIFICATION (pTNM, AJCC 8th Edition)   Primary Tumor (Invasive Carcinoma) (pT): pT1c   Regional Lymph Nodes (pN)   Modifier: (sn): Only sentinel node(s) evaluated.   Category (pN): pN0   D: RIGHT BREAST, CAPSULECTOMY:   BENIGN SOFT TISSUE IDENTIFIED.   E: IMPLANT, RIGHT BREAST, REMOVAL:   BREAST PROSTHESIS IDENTIFIED (GROSS EXAMINATION ONLY).       05/16/17   LEFT BREAST MASS AT 11:30 O'CLOCK (2-3 CM FROM NIPPLE), CORE BIOPSIES:   INFILTRATING CARCINOMA, NUCLEAR GRADE 1, WITH MIXED DUCTAL AND LOBULAR   FEATURES.   ESTROGEN RECEPTOR: POSITIVE (100%) POSITIVE > 1%   PROGESTERONE RECEPTOR: POSITIVE (100%) POSITIVE > 1%   Negative for Her-2 amplification       Patient Active Problem List   Diagnosis Code   ??? Essential hypertension I10   ??? DM type 2, uncontrolled, with neuropathy ([place]) E11.40, E11.65   ??? Pain of left hip joint M25.552   ??? Low back pain M54.5   ??? 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 Smoker F17.200   ??? Dupuytren contracture M72.0   ??? Breast cancer of upper-outer quadrant of left female breast (HCC) C50.412   ??? S/P mastectomy, left Z90.12   ??? Cigarette nicotine dependence without complication S50.539      52 year old woman with ER/PR  positive, HER negative T1cN0 left breast cancer s/p left mastectomy, sentinel node biopsy with reconstruction and revision of right breast per Dr. Ronnald Ramp 06/10/17 as well as a history of right breast cancer in 2008. Continue ovarian suppression and aromatase inhibitor (Lupron and Femara) per Dr. Brent General.  Continue to follow up with Dr. Ronnald Ramp.   Tobacco cessation recommended.  Follow up 3 months.  Please  call sooner with questions or concerns.

## 2018-01-19 NOTE — Patient Instructions (Signed)
If you have any questions or concerns about today's appointment, the verbal and/or written instructions you were given for follow up care, please call our office at [phone].    [facility] [place]  [address], [address]  [place] [address]    [phone] office  [phone]

## 2018-02-06 ENCOUNTER — Emergency Department: Admit: 2018-02-07 | Payer: PRIVATE HEALTH INSURANCE | Primary: Nurse Practitioner

## 2018-02-06 DIAGNOSIS — R630 Anorexia: Secondary | ICD-10-CM

## 2018-02-06 NOTE — ED Triage Notes (Signed)
Pt arrived through triage with c/o weakness and decreased appetite r/t diabetes medication.

## 2018-02-06 NOTE — ED Notes (Signed)
Report to Omar RN

## 2018-02-07 ENCOUNTER — Inpatient Hospital Stay
Admit: 2018-02-07 | Discharge: 2018-02-07 | Disposition: A | Discharge status: Home / Self Care | Payer: PRIVATE HEALTH INSURANCE | Attending: Emergency Medicine

## 2018-02-07 LAB — CBC WITH AUTOMATED DIFF
ABS. BASOPHILS: 0.1 10*3/uL (ref 0.0–0.1)
ABS. EOSINOPHILS: 0.3 10*3/uL (ref 0.0–0.4)
ABS. LYMPHOCYTES: 4.9 10*3/uL — ABNORMAL HIGH (ref 0.9–3.6)
ABS. MONOCYTES: 0.8 10*3/uL (ref 0.05–1.2)
ABS. NEUTROPHILS: 6 10*3/uL (ref 1.8–8.0)
BASOPHILS: 0 % (ref 0–2)
EOSINOPHILS: 2 % (ref 0–5)
HCT: 42.8 % (ref 35.0–45.0)
HGB: 15.5 g/dL (ref 12.0–16.0)
LYMPHOCYTES: 41 % (ref 21–52)
MCH: 31.6 PG (ref 24.0–34.0)
MCHC: 36.2 g/dL (ref 31.0–37.0)
MCV: 87.3 FL (ref 74.0–97.0)
MONOCYTES: 6 % (ref 3–10)
MPV: 10.1 FL (ref 9.2–11.8)
NEUTROPHILS: 51 % (ref 40–73)
PLATELET: 274 10*3/uL (ref 135–420)
RBC: 4.9 M/uL (ref 4.20–5.30)
RDW: 12.3 % (ref 11.6–14.5)
WBC: 12 10*3/uL (ref 4.6–13.2)

## 2018-02-07 LAB — URINALYSIS W/ RFLX MICROSCOPIC
Bilirubin: NEGATIVE
Blood: NEGATIVE
Glucose: NEGATIVE mg/dL
Ketone: 15 mg/dL — AB
Nitrites: NEGATIVE
Protein: NEGATIVE mg/dL
Specific gravity: 1.008 (ref 1.005–1.030)
Urobilinogen: 1 EU/dL (ref 0.2–1.0)
pH (UA): 7.5 (ref 5.0–8.0)

## 2018-02-07 LAB — METABOLIC PANEL, COMPREHENSIVE
A-G Ratio: 1 (ref 0.8–1.7)
ALT (SGPT): 22 U/L (ref 13–56)
AST (SGOT): 20 U/L (ref 15–37)
Albumin: 3.9 g/dL (ref 3.4–5.0)
Alk. phosphatase: 168 U/L — ABNORMAL HIGH (ref 45–117)
Anion gap: 11 mmol/L (ref 3.0–18)
BUN/Creatinine ratio: 12 (ref 12–20)
BUN: 6 MG/DL — ABNORMAL LOW (ref 7.0–18)
Bilirubin, total: 0.5 MG/DL (ref 0.2–1.0)
CO2: 22 mmol/L (ref 21–32)
Calcium: 9.7 MG/DL (ref 8.5–10.1)
Chloride: 108 mmol/L (ref 100–108)
Creatinine: 0.5 MG/DL — ABNORMAL LOW (ref 0.6–1.3)
GFR est AA: >60 mL/min/{1.73_m2} (ref >60)
GFR est non-AA: >60 mL/min/{1.73_m2} (ref >60)
Globulin: 4.1 g/dL — ABNORMAL HIGH (ref 2.0–4.0)
Glucose: 135 mg/dL — ABNORMAL HIGH (ref 74–99)
Potassium: 3.5 mmol/L (ref 3.5–5.5)
Protein, total: 8 g/dL (ref 6.4–8.2)
Sodium: 141 mmol/L (ref 136–145)

## 2018-02-07 LAB — LIPASE: Lipase: 98 U/L (ref 73–393)

## 2018-02-07 LAB — URINE MICROSCOPIC ONLY
Bacteria: NEGATIVE /hpf
RBC: NEGATIVE /hpf (ref 0–5)
WBC: 0 /hpf (ref 0–4)

## 2018-02-07 MED ORDER — SODIUM CHLORIDE 0.9% BOLUS IV
0.9 % | Freq: Once | INTRAVENOUS | Status: AC
Start: 2018-02-07 — End: 2018-02-06
  Administered 2018-02-07: 01:00:00 via INTRAVENOUS

## 2018-02-07 MED ORDER — ONDANSETRON (PF) 4 MG/2 ML INJECTION
4 mg/2 mL | INTRAMUSCULAR | Status: AC
Start: 2018-02-07 — End: 2018-02-06
  Administered 2018-02-07: 01:00:00 via INTRAVENOUS

## 2018-02-07 MED FILL — ONDANSETRON (PF) 4 MG/2 ML INJECTION: 4 mg/2 mL | INTRAMUSCULAR | Qty: 2

## 2018-02-07 MED FILL — SODIUM CHLORIDE 0.9 % IV: INTRAVENOUS | Qty: 1000

## 2018-02-07 NOTE — ED Provider Notes (Signed)
EMERGENCY DEPARTMENT HISTORY AND PHYSICAL EXAM    Date: 02/06/2018  Patient Name: Barbara [name]    History of Presenting Illness     Chief Complaint   Patient presents with   ??? Decreased Appetite         History Provided By: Patient    Additional History (Context):   9:24 PM  Barbara [name] is a 52 y.o. female with PMHX of breast cancer, now returning, diabetes, and multiple cancer, ortho, and OBGyn surgeries who presents to the emergency department C/O lack of appetite since starting a new diabetes medication.  She states this came on after starting Trulicity in mid Feb.  Previously it was only a day or two after starting the injections, now it seems to last [name] week long.  She has some decreased wgt, though no change in clothing sizes; denies fevers, chills, night sweats or cough..  No new or changes in her chemo regimen    Social history  Still smokes, no EtOH or drug use    Fam hx  Mother with diabetes    PCP: Karen Kays, MD    Current Outpatient Medications   Medication Sig Dispense Refill   ??? sAXagliptin (ONGLYZA) 5 mg tab tablet Take 1 Tab by mouth daily. 90 Tab 0   ??? ibuprofen (MOTRIN) 600 mg tablet Take 1 Tab by mouth every six (6) hours as needed for Pain. 20 Tab 0   ??? lidocaine (LIDODERM) 5 % Apply patch to the affected area for 12 hours a day and remove for 12 hours a day. 1 Each 0   ??? venlafaxine-SR (EFFEXOR-XR) 37.5 mg capsule Take 1 Cap by mouth daily. 90 Cap 0   ??? aspirin delayed-release 81 mg tablet Take  by mouth daily.     ??? ramipril (ALTACE) 10 mg capsule Take 1 Cap by mouth daily. 30 Cap 3   ??? dulaglutide (TRULICITY) 1.5 FM/3.8 mL sub-q pen 0.5 mL by SubCUTAneous route every seven (7) days. 5 Pen 3   ??? gabapentin (NEURONTIN) 600 mg tablet Take 1 Tab by mouth three (3) times daily. 90 Tab 2   ??? letrozole (FEMARA) 2.5 mg tablet Take 2.5 mg by mouth daily.     ??? insulin NPH/insulin regular (NOVOLIN 70/30, HUMULIN 70/30) 100 unit/mL  (70-30) injection Administer 25 units subcut twice daily 30 minutes before breakfast and dinner (Patient taking differently: 30 Units by SubCUTAneous route two (2) times a day. Administer 25 units subcut twice daily 30 minutes before breakfast and dinner) 20 mL 0       Past History     Past Medical History:  Past Medical History:   Diagnosis Date   ??? Back pain    ??? Breast cancer ([place]) 2008 or 2009   ??? Diabetes ([place])    ??? FH: chemotherapy    ??? Radiation therapy complication 4665    NOT complication, just radiation       Past Surgical History:  Past Surgical History:   Procedure Laterality Date   ??? BIOPSY BREAST     ??? BIOPSY FINE NEEDLE     ??? HX BREAST AUGMENTATION Right 2010   ??? HX BREAST AUGMENTATION Left 09/17/2017    left breast reconstruction with exchange of tissue expander per breast prostesis and left breast tissue expander performed by [name], MD at [facility]   ??? HX BREAST RECONSTRUCTION Right 2011    per pt - attempted latissimus dorsi flap, then saline implant   ???  HX BREAST RECONSTRUCTION Bilateral 06/10/2017    right breast prosthesis and capsule removal and bilateral breast tissue  expander reconstruction.  performed by [name], MD at [facility]   ??? HX BREAST RECONSTRUCTION Left 07/09/2017    left breast  EXCISION and closure of wound performed by [name], MD at [facility]   ??? HX BREAST RECONSTRUCTION Bilateral     Tissue expanders, to prepare for implants    ??? HX CESAREAN SECTION     ??? HX LUMBAR DISKECTOMY      herniated disc   ??? HX MASTECTOMY Right 2010    Total mastectomy per pt.     ??? HX MASTECTOMY Left 06/10/2017    left mastectomy,Sentinal node biopsies. performed by [name], MD at [facility]   ??? HX MASTECTOMY Bilateral    ??? HX MASTOPEXY (BREAST LIFT) Left 2011   ??? IMPLANT BREAST SILICONE/EQ Right 4098    post mastectomy   ??? MULTIPLE DELIVERY C-SECTION      x2   ??? THUMB SUPPORT      thumb surgery to mobilize thumb       Family History:  Family History    Problem Relation Age of Onset   ??? Diabetes Mother    ??? No Known Problems Father        Social History:  Social History     Tobacco Use   ??? Smoking status: Current Every Day Smoker     Packs/day: 0.25   ??? Smokeless tobacco: Never Used   ??? Tobacco comment: 5-6 cigs per day   Substance Use Topics   ??? Alcohol use: No   ??? Drug use: No       Allergies:  Allergies   Allergen Reactions   ??? Tramadol Anaphylaxis   ??? Metformin Diarrhea         Review of Systems   Review of Systems   Constitutional: Positive for appetite change and fatigue. Negative for chills, diaphoresis, fever and unexpected weight change.   HENT: Negative for congestion, drooling, ear pain, rhinorrhea, sore throat, tinnitus and trouble swallowing.    Eyes: Negative for photophobia, pain, redness and visual disturbance.   Respiratory: Negative for cough, choking, chest tightness, shortness of breath, wheezing and stridor.    Cardiovascular: Negative for chest pain, palpitations and leg swelling.   Gastrointestinal: Negative for abdominal distention, abdominal pain, anal bleeding, blood in stool, constipation, diarrhea, nausea and vomiting.   Endocrine: Negative for cold intolerance, heat intolerance, polydipsia and polyuria.   Genitourinary: Negative for difficulty urinating, dysuria, flank pain, frequency, hematuria and urgency.   Musculoskeletal: Negative for arthralgias, back pain and neck pain.   Skin: Negative for color change, rash and wound.   Allergic/Immunologic: Negative for immunocompromised state.   Neurological: Negative for dizziness, seizures, syncope, speech difficulty, light-headedness and headaches.   Hematological: Does not bruise/bleed easily.   Psychiatric/Behavioral: Negative for agitation, behavioral problems, hallucinations, self-injury and suicidal ideas. The patient is not hyperactive.        Physical Exam     Vitals:    02/06/18 2230 02/06/18 2245 02/06/18 2300 02/06/18 2315   BP: 142/88 (!) 148/97 155/89 160/90   Pulse:        Resp:       Temp:       SpO2: 99% 99% 100% 100%     Physical Exam   Constitutional: She is oriented to person, place, and time. She appears well-developed and well-nourished. No  distress.   HENT:   Head: Normocephalic and atraumatic.   Right Ear: External ear normal.   Left Ear: External ear normal.   Mouth/Throat: Oropharynx is clear and moist. No oropharyngeal exudate.   Eyes: Pupils are equal, round, and reactive to light. Conjunctivae and EOM are normal. No scleral icterus.   No pallor   Neck: Normal range of motion. Neck supple. No JVD present. No tracheal deviation present. No thyromegaly present.   Cardiovascular: Normal rate, regular rhythm and normal heart sounds.   Pulmonary/Chest: Effort normal and breath sounds normal. No stridor. No respiratory distress.   Abdominal: Soft. Bowel sounds are normal. She exhibits no distension. There is no tenderness. There is no rebound and no guarding.   Musculoskeletal: Normal range of motion. She exhibits no edema or tenderness.   No soft tissue injuries   Lymphadenopathy:     She has no cervical adenopathy.   Neurological: She is alert and oriented to person, place, and time. She has normal reflexes. No cranial nerve deficit. Coordination normal.   Skin: Skin is warm and dry. No rash noted. She is not diaphoretic. No erythema.   Well appearing surgical scars.  Good skin turgor   Psychiatric: She has a normal mood and affect. Her behavior is normal. Judgment and thought content normal.   Nursing note and vitals reviewed.        Diagnostic Study Results     Labs -     ??   Contains abnormal data URINALYSIS W/ RFLX MICROSCOPIC (Final result)   ??Component (Lab Inquiry)   Collection Time Result Time COLOR APPRN [facility] PHU PROTU   02/06/18 21:50:00 02/06/18 22:57:06 YELLOW CLEAR 1.008 7.5 NEGATIVE    ??   Collection Time Result Time Rexanne Mano BILU BLDU UROU   02/06/18 21:50:00 02/06/18 22:57:06 NEGATIVE  15Abnormal  NEGATIVE  NEGATIVE  1.0   ??    Collection Time Result Time NITU LEUKU   02/06/18 21:50:00 02/06/18 22:57:06 NEGATIVE  SMALLAbnormal    ??   Final result                ??   ??   Contains abnormal data URINE MICROSCOPIC ONLY (Final result)   ??Component (Lab Inquiry)   Collection Time Result Time WBCU RBCU EPSU BACTU MUCUS   02/06/18 21:50:00 02/06/18 22:57:06 0 to 3 NEGATIVE  FEW NEGATIVE  FEWAbnormal    ??   Final result                ??   ??   Contains abnormal data CBC WITH AUTOMATED DIFF (Final result)   ??Component (Lab Inquiry)   Collection Time Result Time WBC RBC HGB HCT MCV   02/06/18 20:40:00 02/06/18 21:02:10 12.0 4.90 15.5 42.8 87.3   ??   Collection Time Result Time MCH MCHC RDW PLT MPLV   02/06/18 20:40:00 02/06/18 21:02:10 31.6 36.2 12.3 274 10.1   ??   Collection Time Result Time GRANS LYMPH MONOS EOS BASOS   02/06/18 20:40:00 02/06/18 21:02:10 51 41 6 2 0   ??   Collection Time Result Time ABG ABL ABM ABE ABB   02/06/18 20:40:00 02/06/18 21:02:10 6.0 4.9High  0.8 0.3 0.1   ??   Collection Time Result Time DF   02/06/18 20:40:00 02/06/18 21:02:10 AUTOMATED   ??   Final result                ??   ??   Contains abnormal data METABOLIC  PANEL, COMPREHENSIVE (Final result)   ??Component (Lab Inquiry)   Collection Time Result Time NA K CL CO2 AGAP   02/06/18 20:40:00 02/06/18 21:16:55 141 3.5 108 22 11   ??   Collection Time Result Time GLU BUN CREA BUCR GFRAA   02/06/18 20:40:00 02/06/18 21:16:55 135High  6Low  0.50Low  12 >60   ??   Collection Time Result Time GFRNA CA TBIL GPT SGOT   02/06/18 20:40:00 02/06/18 21:16:55 >60  (NOTE)   Estimated GFR ... 9.7 0.5 22 20    ??   Collection Time Result Time AP TP ALB GLOB AGRAT   02/06/18 20:40:00 02/06/18 21:16:55 168High  8.0 3.9 4.1High  1.0   ??   Final result                ??   ??   LIPASE (Final result)   ??Component (Lab Inquiry)   Collection Time Result Time LPSE   02/06/18 20:40:00 02/06/18 21:14:13 98     ??   Final result                ??         Radiologic Studies -   Preliminary findings  AAS   Chest, normal without visible mass.  Surgical changes noted.  Abd - non spec gas bowel pattern  Final radiologist report pending.    XR ABD ACUTE W 1 V CHEST   Final Result   Impression:   Nonobstructive bowel gas pattern. No evidence of acute abnormality.         CT Results  (Last 48 hours)    None        CXR Results  (Last 48 hours)    None          Medications given in the ED-  Medications   sodium chloride 0.9 % bolus infusion 1,000 mL (0 mL IntraVENous IV Completed 02/06/18 2154)   ondansetron (ZOFRAN) injection 4 mg (4 mg IntraVENous Given 02/06/18 2054)         Medical Decision Making   I am the first provider for this patient.    I reviewed the vital signs, available nursing notes, past medical history, past surgical history, family history and social history.    Vital Signs-Reviewed the patient's vital signs.    Pulse Oximetry Analysis - 100% on room air    Records Reviewed: NURSING NOTES AND PREVIOUS MEDICAL RECORDS    Provider Notes (Medical Decision Making):   Anorexia after medications could be malignancy, , though she blames medications - no previous problems before so this is quite possible. She has had a LARGE NUMBER OF PREVIOUS CT's, therefore deferred here tonight. Plain films show no sign of spreading mass or obstructive pattern.  Would suggest holding medications until discussing with her PCP and Oncology team.  Cautioned for vomiting, fevers, syncope    Procedures:  Procedures    ED Course:   9:24 PM: Initial assessment performed. The patients presenting problems have been discussed, and they are in agreement with the care plan formulated and outlined with them.  I have encouraged them to ask questions as they arise throughout their visit.    The patient was counseled on the dangers of tobacco use, and was advised to quit.  Reviewed strategies to maximize success, including removing cigarettes and smoking materials from environment.      Diagnosis and Disposition       DISCHARGE NOTE:  11:50 PM   Barbara [name]'s  results have been reviewed with her.  She has been counseled regarding her diagnosis, treatment, and plan.  She verbally conveys understanding and agreement of the signs, symptoms, diagnosis, treatment and prognosis and additionally agrees to follow up as discussed.  She also agrees with the care-plan and conveys that [name] of her questions have been answered.  I have also provided discharge instructions for her that include: educational information regarding their diagnosis and treatment, and list of reasons why they would want to return to the ED prior to their follow-up appointment, should her condition change. She has been provided with education for proper emergency department utilization.     CLINICAL IMPRESSION:    1. Anorexia    2. Malignant neoplasm of upper-outer quadrant of left breast in female, estrogen receptor positive ([place])    3. S/P mastectomy, left    4. Smoker    5. Essential hypertension    6. DM type 2, uncontrolled, with neuropathy ([place])        PLAN:  1. D/C Home  2.   Discharge Medication List as of 02/06/2018 11:57 PM        3.   Follow-up Information     Follow up With Specialties Details Why Contact Info    Bumanglag, Nunzio Cory, MD Internal Medicine In 1 week  [address]  [address]  [place] [address]  [phone]      Reita Cliche, MD Breast Surgery In 1 week  [address]  [place]  [place] [address]  [phone]          _______________________________    This note was partially transcribed via voice recognition software. Although efforts have been made to catch any discrepancies, it may contain sound alike words, grammatical errors, or nonsensical words.

## 2018-02-07 NOTE — ED Notes (Signed)
I have reviewed discharge instructions with the patient.  The patient verbalized understanding. Patient armband removed and given to patient to take home.  Patient was informed of the privacy risks if armband lost or stolen

## 2018-02-10 NOTE — Telephone Encounter (Signed)
Spoke with Barbara [name] to inform her insurance [facility] require referrals.  [name] states Dr. Murlean Hark was her PCP but her new PCP is Dr. Lissa Hoard.  Emailed Kim at [facility] to request referral for [name] office visits 01/19/2018 and 04/20/2018

## 2018-02-16 NOTE — Telephone Encounter (Signed)
Continue current medications for now. Advise to bring blood sugar diary on follow-up appointment on 5/13.

## 2018-02-16 NOTE — Telephone Encounter (Signed)
Please advise,pt seen in ER on 02/06/2018.

## 2018-02-16 NOTE — Telephone Encounter (Signed)
Pt was recently discharged from hospital due to having a bad reaction to the Trulicity that Dr. B had put her on. She was wanting to know if Dr. B would like to put her on something else to help with her diabetes. Please advise.

## 2018-02-16 NOTE — Telephone Encounter (Signed)
Nurse called patient, two patient identifiers used and confirmed by patient. Spoke with patient, advised to continue current medication and follow up with blood sugar diary on follow up visit. Pt verbalized understanding.

## 2018-02-23 ENCOUNTER — Ambulatory Visit
Admit: 2018-02-23 | Discharge: 2018-02-23 | Payer: PRIVATE HEALTH INSURANCE | Attending: Internal Medicine | Primary: Nurse Practitioner

## 2018-02-23 DIAGNOSIS — IMO0002 Reserved for concepts with insufficient information to code with codable children: Secondary | ICD-10-CM

## 2018-02-23 LAB — AMB POC GLUCOSE BLOOD, BY GLUCOSE MONITORING DEVICE: Glucose POC: 319 mg/dL

## 2018-02-23 LAB — AMB POC HEMOGLOBIN A1C: Hemoglobin A1c (POC): 9.2 %

## 2018-02-23 MED ORDER — NYSTATIN 100,000 UNIT/G TOPICAL POWDER
100000 unit/gram | CUTANEOUS | 3 refills | Status: AC
Start: 2018-02-23 — End: ?

## 2018-02-23 MED ORDER — RAMIPRIL 10 MG CAP
10 mg | ORAL_CAPSULE | Freq: Every day | ORAL | 3 refills | Status: DC
Start: 2018-02-23 — End: 2019-03-12

## 2018-02-23 MED ORDER — FLUCONAZOLE 150 MG TAB
150 mg | ORAL_TABLET | ORAL | 0 refills | Status: AC
Start: 2018-02-23 — End: 2018-02-27

## 2018-02-23 MED ORDER — ATORVASTATIN 10 MG TAB
10 mg | ORAL_TABLET | Freq: Every day | ORAL | 1 refills | Status: DC
Start: 2018-02-23 — End: 2018-09-07

## 2018-02-23 MED ORDER — VENLAFAXINE SR 75 MG 24 HR CAP
75 mg | ORAL_CAPSULE | Freq: Every day | ORAL | 1 refills | Status: DC
Start: 2018-02-23 — End: 2018-06-22

## 2018-02-23 MED ORDER — SAXAGLIPTIN 5 MG TAB
5 mg | ORAL_TABLET | Freq: Every day | ORAL | 1 refills | Status: DC
Start: 2018-02-23 — End: 2018-10-23

## 2018-02-23 MED ORDER — KETOCONAZOLE 2 % TOPICAL CREAM
2 % | Freq: Two times a day (BID) | CUTANEOUS | 3 refills | Status: AC
Start: 2018-02-23 — End: ?

## 2018-02-23 MED ORDER — GABAPENTIN 600 MG TAB
600 mg | ORAL_TABLET | Freq: Three times a day (TID) | ORAL | 2 refills | Status: DC
Start: 2018-02-23 — End: 2018-06-22

## 2018-02-23 MED ORDER — INSULIN NPH/INSULIN REGULAR (70/30) 100 UNIT/ML INJECTION
100 unit/mL (70-30) | SUBCUTANEOUS | 3 refills | Status: DC
Start: 2018-02-23 — End: 2018-11-15

## 2018-02-23 MED ORDER — AMOXICILLIN 500 MG CAP
500 mg | ORAL_CAPSULE | Freq: Three times a day (TID) | ORAL | 0 refills | Status: AC
Start: 2018-02-23 — End: 2018-03-02

## 2018-02-23 NOTE — Addendum Note (Signed)
Addended by: Karen Kays on: 02/23/2018 01:40 PM     Modules accepted: Orders

## 2018-02-23 NOTE — Progress Notes (Signed)
1. Have you been to the ER, urgent care clinic since your last visit?  Hospitalized since your last visit?[facility] nausea, trulicity, 2/33/00    2. Have you seen or consulted any other health care providers outside of the [facility] since your last visit?  Include any pap smears or colon screening. No

## 2018-02-23 NOTE — Progress Notes (Addendum)
History of Present Illness  Barbara [name] is a 52 y.o. female who presents today for management of    Chief Complaint   Patient presents with   ??? Diabetes   ??? Depression     phq2 showed more questions     Patient complains of left ear pressure.    Diabetes Mellitus:  She has diabetes mellitus, and  hypertension.  Diabetic ROS - medication compliance: compliant all of the time, diabetic diet compliance: compliant most of the time, home glucose monitoring: fasting values range 170.   Lab review: orders written for new lab studies as appropriate; see orders.     Cardiovascular Review:  The patient has hypertension.  Diet and Lifestyle: not attempting to follow a low fat, low cholesterol diet, not attempting to follow a low sodium diet, sedentary, nonsmoker  Home BP Monitoring: is not measured at home.  Pertinent ROS: taking medications as instructed, no medication side effects noted, no TIA's, no chest pain on exertion, no dyspnea on exertion, no swelling of ankles.     Depression Review:  Patient is seen for followup of depression. Treatment includes Effexor and no other therapies.   Ongoing symptoms include depressed mood and anhedonia.  She denies recurrent thoughts of death and suicidal attempt.   She experiences the following side effects from the treatment: none.    Problem List  Patient Active Problem List    Diagnosis Date Noted   ??? Recurrent depression ([place]) 02/23/2018   ??? History of breast cancer 02/23/2018   ??? Cigarette nicotine dependence without complication 44/12/4740   ??? S/P mastectomy, left 06/11/2017   ??? Smoker 05/22/2017   ??? Dupuytren contracture 05/22/2017   ??? Low back pain 03/04/2017   ??? Essential hypertension 01/29/2017   ??? DM type 2, uncontrolled, with neuropathy ([place]) 01/29/2017   ??? Pain of left hip joint 01/29/2017     Past Medical History  Past Medical History:   Diagnosis Date   ??? Back pain    ??? Breast cancer ([place]) 2008 or 2009   ??? Diabetes ([place])    ??? FH: chemotherapy     ??? Radiation therapy complication 5956    NOT complication, just radiation      Surgical History  Past Surgical History:   Procedure Laterality Date   ??? BIOPSY BREAST     ??? BIOPSY FINE NEEDLE     ??? HX BREAST AUGMENTATION Right 2010   ??? HX BREAST AUGMENTATION Left 09/17/2017    left breast reconstruction with exchange of tissue expander per breast prostesis and left breast tissue expander performed by [name], MD at [facility]   ??? HX BREAST RECONSTRUCTION Right 2011    per pt - attempted latissimus dorsi flap, then saline implant   ??? HX BREAST RECONSTRUCTION Bilateral 06/10/2017    right breast prosthesis and capsule removal and bilateral breast tissue  expander reconstruction.  performed by [name], MD at [place]   ??? HX BREAST RECONSTRUCTION Left 07/09/2017    left breast  EXCISION and closure of wound performed by [name], MD at [facility]   ??? HX BREAST RECONSTRUCTION Bilateral     Tissue expanders, to prepare for implants    ??? HX CESAREAN SECTION     ??? HX LUMBAR DISKECTOMY      herniated disc   ??? HX MASTECTOMY Right 2010    Total mastectomy per pt.     ??? HX MASTECTOMY Left 06/10/2017  left mastectomy,Sentinal node biopsies. performed by [name], MD at [facility]   ??? HX MASTECTOMY Bilateral    ??? HX MASTOPEXY (BREAST LIFT) Left 2011   ??? IMPLANT BREAST SILICONE/EQ Right 3818    post mastectomy   ??? MULTIPLE DELIVERY C-SECTION      x2   ??? THUMB SUPPORT      thumb surgery to mobilize thumb      Current Medications  Current Outpatient Medications   Medication Sig   ??? sAXagliptin (ONGLYZA) 5 mg tab tablet Take 1 Tab by mouth daily.   ??? gabapentin (NEURONTIN) 600 mg tablet Take 1 Tab by mouth three (3) times daily.   ??? insulin NPH/insulin regular (NOVOLIN 70/30, HUMULIN 70/30) 100 unit/mL (70-30) injection 35 Units Daily (before breakfast) AND 30 Units Daily (before dinner).   ??? ramipril (ALTACE) 10 mg capsule Take 1 Cap by mouth daily.    ??? venlafaxine-SR (EFFEXOR-XR) 75 mg capsule Take 1 Cap by mouth daily.   ??? atorvastatin (LIPITOR) 10 mg tablet Take 1 Tab by mouth daily.   ??? ibuprofen (MOTRIN) 600 mg tablet Take 1 Tab by mouth every six (6) hours as needed for Pain.   ??? lidocaine (LIDODERM) 5 % Apply patch to the affected area for 12 hours a day and remove for 12 hours a day.   ??? aspirin delayed-release 81 mg tablet Take  by mouth daily.   ??? letrozole (FEMARA) 2.5 mg tablet Take 2.5 mg by mouth daily.     No current facility-administered medications for this visit.        Allergies/Drug Reactions  Allergies   Allergen Reactions   ??? Tramadol Anaphylaxis   ??? Metformin Diarrhea   ??? Trulicity [Dulaglutide] Nausea Only        Family History  Family History   Problem Relation Age of Onset   ??? Diabetes Mother    ??? No Known Problems Father         Social History  Social History     Socioeconomic History   ??? Marital status: MARRIED     Spouse name: Not on file   ??? Number of children: Not on file   ??? Years of education: Not on file   ??? Highest education level: Not on file   Occupational History   ??? Not on file   Social Needs   ??? Financial resource strain: Not on file   ??? Food insecurity:     Worry: Not on file     Inability: Not on file   ??? Transportation needs:     Medical: Not on file     Non-medical: Not on file   Tobacco Use   ??? Smoking status: Current Every Day Smoker     Packs/day: 0.25   ??? Smokeless tobacco: Never Used   ??? Tobacco comment: 5-6 cigs per day   Substance and Sexual Activity   ??? Alcohol use: No   ??? Drug use: No   ??? Sexual activity: Not on file   Lifestyle   ??? Physical activity:     Days per week: Not on file     Minutes per session: Not on file   ??? Stress: Not on file   Relationships   ??? Social connections:     Talks on phone: Not on file     Gets together: Not on file     Attends religious service: Not on file     Active member of club  or organization: Not on file     Attends meetings of clubs or organizations: Not on file      Relationship status: Not on file   ??? Intimate partner violence:     Fear of current or ex partner: Not on file     Emotionally abused: Not on file     Physically abused: Not on file     Forced sexual activity: Not on file   Other Topics Concern   ??? Military Service Not Asked   ??? Blood Transfusions Not Asked   ??? Caffeine Concern Not Asked   ??? Occupational Exposure Not Asked   ??? Hobby Hazards Not Asked   ??? Sleep Concern Not Asked   ??? Stress Concern Not Asked   ??? Weight Concern Not Asked   ??? Special Diet Not Asked   ??? Back Care Not Asked   ??? Exercise Not Asked   ??? Bike Helmet Not Asked   ??? Seat Belt Not Asked   ??? Self-Exams Not Asked   Social History Narrative   ??? Not on file       Review of Systems  Negative except as mentioned in HPI    Physical Exam  Vital signs:   Vitals:    02/23/18 1300   BP: 141/84   Pulse: 77   Resp: 15   Temp: 97 ??F (36.1 ??C)   TempSrc: Oral   SpO2: 97%   Weight: 134 lb (60.8 kg)   Height: 5' 2"  (1.575 m)       General: alert, oriented, not in distress  Eyes: clear conjunctivae, anicteric sclerae, full and equal ROMs  Right ear: normal  Left ear: air-fluid level behind TM  Chest/Lungs: clear breath sounds, no wheezing or crackles  Heart: normal rate, regular rhythm, no murmur  Extremities: no focal deformities, no edema  Neuro: AAOx3, CN's grossly intact  Skin: red large patch below the abdominal fold, topped with scaling.      Laboratory/Tests:  Component      Latest Ref Rng & Units 02/23/2018 02/23/2018 02/06/2018 02/06/2018           1:14 PM  1:14 PM  9:50 PM  9:50 PM   Color          YELLOW   Appearance          CLEAR   Specific gravity      1.005 - 1.030      1.008   pH (UA)      5.0 - 8.0      7.5   Protein      NEG mg/dL    NEGATIVE   Glucose      NEG mg/dL    NEGATIVE   Ketone      NEG mg/dL    15 (A)   Bilirubin      NEG      NEGATIVE   Blood      NEG      NEGATIVE   Urobilinogen      0.2 - 1.0 EU/dL    1.0   Nitrites      NEG      NEGATIVE   Leukocyte Esterase      NEG      SMALL (A)    WBC      0 - 4 /hpf   0 to 3    RBC      0 - 5 /hpf   NEGATIVE    Epithelial cells  0 - 5 /lpf   FEW    Bacteria      NEG /hpf   NEGATIVE    Mucus      NEG /lpf   FEW (A)    Hemoglobin A1c (POC)      %  9.2     GLUCOSE,FAST - POC      mg/dL 319        Component      Latest Ref Rng & Units 02/06/2018 02/06/2018           8:40 PM  8:40 PM   WBC      4.6 - 13.2 K/uL  12.0   RBC      4.20 - 5.30 M/uL  4.90   HGB      12.0 - 16.0 g/dL  15.5   HCT      35.0 - 45.0 %  42.8   MCV      74.0 - 97.0 FL  87.3   MCH      24.0 - 34.0 PG  31.6   MCHC      31.0 - 37.0 g/dL  36.2   RDW      11.6 - 14.5 %  12.3   PLATELET      135 - 420 K/uL  274   MPV      9.2 - 11.8 FL  10.1   NEUTROPHILS      40 - 73 %  51   LYMPHOCYTES      21 - 52 %  41   MONOCYTES      3 - 10 %  6   EOSINOPHILS      0 - 5 %  2   BASOPHILS      0 - 2 %  0   ABS. NEUTROPHILS      1.8 - 8.0 K/UL  6.0   ABS. LYMPHOCYTES      0.9 - 3.6 K/UL  4.9 (H)   ABS. MONOCYTES      0.05 - 1.2 K/UL  0.8   ABS. EOSINOPHILS      0.0 - 0.4 K/UL  0.3   ABS. BASOPHILS      0.0 - 0.1 K/UL  0.1   DF        AUTOMATED   Sodium      136 - 145 mmol/L 141    Potassium      3.5 - 5.5 mmol/L 3.5    Chloride      100 - 108 mmol/L 108    CO2      21 - 32 mmol/L 22    Anion gap      3.0 - 18 mmol/L 11    Glucose      74 - 99 mg/dL 135 (H)    BUN      7.0 - 18 MG/DL 6 (L)    Creatinine      0.6 - 1.3 MG/DL 0.50 (L)    BUN/Creatinine ratio      12 - 20   12    GFR est AA      >60 ml/min/1.24m >60    GFR est non-AA      >60 ml/min/1.785m>60    Calcium      8.5 - 10.1 MG/DL 9.7    Bilirubin, total      0.2 - 1.0 MG/DL 0.5    ALT (SGPT)      13 - 56 U/L 22  AST      15 - 37 U/L 20    Alk. phosphatase      45 - 117 U/L 168 (H)    Protein, total      6.4 - 8.2 g/dL 8.0    Albumin      3.4 - 5.0 g/dL 3.9    Globulin      2.0 - 4.0 g/dL 4.1 (H)    A-G Ratio      0.8 - 1.7   1.0        Assessment/Plan:    1. Essential hypertension  - controlled   - ramipril (ALTACE) 10 mg capsule; Take 1 Cap by mouth daily.  Dispense: 90 Cap; Refill: 3    3. Type 2 diabetes mellitus with microalbuminuria, with long-term current use of insulin (HCC)  - needs better control  - start sAXagliptin (ONGLYZA) 5 mg tab tablet; Take 1 Tab by mouth daily.  Dispense: 90 Tab; Refill: 1  - increase insulin NPH/insulin regular (NOVOLIN 70/30, HUMULIN 70/30) 100 unit/mL (70-30) injection; 35 Units Daily (before breakfast) AND 30 Units Daily (before dinner).  Dispense: 20 mL; Refill: 3    3. Hot flashes due to menopause  - venlafaxine-SR [facility]) 75 mg capsule; Take 1 Cap by mouth daily.  Dispense: 90 Cap; Refill: 1    4. Diabetic polyneuropathy associated with type 2 diabetes mellitus (HCC)  - gabapentin (NEURONTIN) 600 mg tablet; Take 1 Tab by mouth three (3) times daily.  Dispense: 270 Tab; Refill: 2    5. Hypercholesterolemia  - goal LDL <70  - start Lipitor 25m daily  - low fat diet    6. Depression  - increase Effexor to 727mdaily    7. History of breast cancer in female  - on Femara   - follow-up with breast surgeon    8. Otitis media, left  - amoxicillin x 7days    9. Intertrigo  - nystatin powder, ketoconazole cream      Follow-up and Dispositions    ?? Return in about 3 months (around 05/26/2018) for ROV.           I have discussed the diagnosis with the patient and the intended plan as seen in the above orders.  The patient has received an after-visit summary and questions were answered concerning future plans.  I have discussed medication side effects and warnings with the patient as well. I have reviewed the plan of care with the patient, accepted their input and they are in agreement with the treatment goals.       LeKaren KaysMD  Feb 23, 2018

## 2018-03-10 NOTE — Telephone Encounter (Signed)
Patient is scheduled for left hand mass excision on 03/16/18 by Dr. Mady Gemma.     Patient reports that home blood sugar readings range 120-150s. She has been compliant with PO meds and insulin.    Advised to skip morning dose of insulin NPH/regular 70/30 as she will be fasting on the day of surgery. Advised to take usual evening dose with dinner on the day of surgery.    No medical contraindication for planned procedure.    Patient verbalized understanding.

## 2018-03-16 ENCOUNTER — Telehealth

## 2018-03-16 MED ORDER — ONDANSETRON 8 MG TAB, RAPID DISSOLVE
8 mg | ORAL_TABLET | Freq: Three times a day (TID) | ORAL | 0 refills | Status: DC | PRN
Start: 2018-03-16 — End: 2018-10-23

## 2018-03-16 NOTE — Telephone Encounter (Signed)
Pt requesting zofran, feeling nauseous from anesthesia. She had surgery earlier today and got nodules removed from her hand. She states her surgeon is already gone for the day. Please assist.

## 2018-03-16 NOTE — Telephone Encounter (Signed)
Zofran sent to pharmacy

## 2018-03-16 NOTE — Telephone Encounter (Signed)
Pt. Is asking for nausea medication due to anesthesia from surgery. Pt. Stated she called hospital and surgeon was already gone for the day. They advised her to call PCP. Please assist.

## 2018-03-31 NOTE — Interval H&P Note (Signed)
PAT - SURGICAL PRE-ADMISSION INSTRUCTIONS    NAME:  Barbara [name]                                                          TODAY'S DATE:  03/31/2018    SURGERY DATE:  04/01/2018                                  SURGERY ARRIVAL TIME:   1000    1. Do NOT eat or drink anything, including candy or gum, after MIDNIGHT on 03/31/2018  2. 2019 , unless you have specific instructions from your Surgeon or Anesthesia Provider to do so.  3. No smoking on the day of surgery.  4. No alcohol 24 hours prior to the day of surgery.  5. No recreational drugs for one week prior to the day of surgery.  6. Leave [name], including money/purse, at home.  7. Remove [name], nail polish, makeup (including mascara); no lotions, powders, deodorant, or perfume/cologne/after shave.  8. Glasses/Contact lenses and Dentures may be worn to the hospital.  They will be removed prior to surgery.  9. Call your doctor if symptoms of a cold or illness develop within 24 ours prior to surgery.  10. AN ADULT MUST DRIVE YOU HOME AFTER OUTPATIENT SURGERY.   11. If you are having an OUTPATIENT procedure, please make arrangements for a responsible adult to be with you for 24 hours after your surgery.  12. If you are admitted to the hospital, you will be assigned to a bed after surgery is complete.  Normally a family member will not be able to see you until you are in your assigned bed.  72. Family is encouraged to accompany you to the hospital.  We ask visitors in the treatment area to be limited to ONE person at a time to ensure patient privacy.  EXCEPTIONS WILL BE MADE AS NEEDED.  42. Children under 12 are discouraged from entering the treatment area and need to be supervised by an adult when in the waiting room.    Special Instructions:    Take these medications the morning of surgery with a sip of water:  Gabapentin, HOLD oral diabetic medication on the MORNING OF surgery., Follow physician instructions about insulin.  If NO instructions were  given, take your usual dose of BASAL insulin the night BEFORE surgery., STOP anticoagulants AT LEAST 1 WEEK PRIOR to your surgery or, follow other MD instructions:  Stopped Aspirin products 3 weeks ago    Patient Prep:    shower with anti-bacterial soap    These surgical instructions were reviewed with Barbara [name] during the PAT phone call.    Directions:  On the morning of surgery, please go to the [facility].  Enter the building from the [employer] lot entrance, 1st floor (next to the Emergency Room entrance).  Take the elevator to the 2nd floor.  Sign in at the Registration Desk.    If you have any questions and/or concerns, please do not hesitate to call:  (Prior to the day of surgery)  P[facility] unit:  [phone]  (Day of surgery)  [facility] unit:  [phone]

## 2018-03-31 NOTE — Other (Signed)
PAT - SURGICAL PRE-ADMISSION INSTRUCTIONS    NAME:  Barbara [name]                                                          TODAY'S DATE:  03/31/2018    SURGERY DATE:  04/01/2018                                  SURGERY ARRIVAL TIME:   1000    1. Do NOT eat or drink anything, including candy or gum, after MIDNIGHT on 03/31/2018  2. 2019 , unless you have specific instructions from your Surgeon or Anesthesia Provider to do so.  3. No smoking on the day of surgery.  4. No alcohol 24 hours prior to the day of surgery.  5. No recreational drugs for one week prior to the day of surgery.  6. Leave [name], including money/purse, at home.  7. Remove [name], nail polish, makeup (including mascara); no lotions, powders, deodorant, or perfume/cologne/after shave.  8. Glasses/Contact lenses and Dentures may be worn to the hospital.  They will be removed prior to surgery.  9. Call your doctor if symptoms of a cold or illness develop within 24 ours prior to surgery.  10. AN ADULT MUST DRIVE YOU HOME AFTER OUTPATIENT SURGERY.   11. If you are having an OUTPATIENT procedure, please make arrangements for a responsible adult to be with you for 24 hours after your surgery.  12. If you are admitted to the hospital, you will be assigned to a bed after surgery is complete.  Normally a family member will not be able to see you until you are in your assigned bed.  43. Family is encouraged to accompany you to the hospital.  We ask visitors in the treatment area to be limited to ONE person at a time to ensure patient privacy.  EXCEPTIONS WILL BE MADE AS NEEDED.  79. Children under 12 are discouraged from entering the treatment area and need to be supervised by an adult when in the waiting room.    Special Instructions:    Take these medications the morning of surgery with a sip of water:  Gabapentin, HOLD oral diabetic medication on the MORNING OF surgery., Follow physician instructions about insulin.  If NO instructions were  given, take your usual dose of BASAL insulin the night BEFORE surgery., STOP anticoagulants AT LEAST 1 WEEK PRIOR to your surgery or, follow other MD instructions:  Stopped Aspirin products 3 weeks ago    Patient Prep:    shower with anti-bacterial soap    These surgical instructions were reviewed with Governor Rooks during the PAT phone call.    Directions:  On the morning of surgery, please go to the [facility].  Enter the building from the [employer] lot entrance, 1st floor (next to the Emergency Room entrance).  Take the elevator to the 2nd floor.  Sign in at the Registration Desk.    If you have any questions and/or concerns, please do not hesitate to call:  (Prior to the day of surgery)  [facility] unit:  [phone]  (Day of surgery)  [facility] unit:  [phone]

## 2018-04-01 ENCOUNTER — Inpatient Hospital Stay: Payer: PRIVATE HEALTH INSURANCE

## 2018-04-01 LAB — GLUCOSE, POC
Glucose (POC): 149 mg/dL — ABNORMAL HIGH (ref 70–110)
Glucose (POC): 164 mg/dL — ABNORMAL HIGH (ref 70–110)

## 2018-04-01 LAB — HCG URINE, QL. - POC
HCG, Pregnancy, Urine, POC: NEGATIVE
Pregnancy test,urine (POC): NEGATIVE

## 2018-04-01 LAB — POCT GLUCOSE
POC Glucose: 149 mg/dL — ABNORMAL HIGH (ref 70–110)
POC Glucose: 164 mg/dL — ABNORMAL HIGH (ref 70–110)

## 2018-04-01 MED ORDER — CLINDAMYCIN IN D5W 900 MG/50 ML IV PIGGY BACK
900 mg/50 mL | Freq: Once | INTRAVENOUS | Status: AC
Start: 2018-04-01 — End: 2018-04-01
  Administered 2018-04-01: 17:00:00 via INTRAVENOUS

## 2018-04-01 MED ORDER — PHENYLEPHRINE 1 MG/10 ML (100 MCG/ML) IN NS IV SYRINGE
1 mg/0 mL (00 mcg/mL) | INTRAVENOUS | Status: AC
Start: 2018-04-01 — End: ?

## 2018-04-01 MED ORDER — ACETAMINOPHEN 500 MG TAB
500 mg | Freq: Once | ORAL | Status: AC
Start: 2018-04-01 — End: 2018-04-01
  Administered 2018-04-01: 21:00:00 via ORAL

## 2018-04-01 MED ORDER — CEPHALEXIN 250 MG CAP
250 mg | ORAL_CAPSULE | Freq: Three times a day (TID) | ORAL | 2 refills | Status: DC
Start: 2018-04-01 — End: 2018-08-26

## 2018-04-01 MED ORDER — SODIUM CHLORIDE 0.9 % IRRIGATION SOLN
0.9 % | Status: DC | PRN
Start: 2018-04-01 — End: 2018-04-01
  Administered 2018-04-01: 17:00:00

## 2018-04-01 MED ORDER — FENTANYL CITRATE (PF) 50 MCG/ML IJ SOLN
50 mcg/mL | INTRAMUSCULAR | Status: AC
Start: 2018-04-01 — End: ?

## 2018-04-01 MED ORDER — OXYCODONE-ACETAMINOPHEN 5 MG-325 MG TAB
5-325 mg | ORAL_TABLET | ORAL | 0 refills | Status: AC | PRN
Start: 2018-04-01 — End: 2018-04-04

## 2018-04-01 MED ORDER — SODIUM CHLORIDE 0.9 % IJ SYRG
INTRAMUSCULAR | Status: DC | PRN
Start: 2018-04-01 — End: 2018-04-01

## 2018-04-01 MED ORDER — CEFAZOLIN 2 GM/50 ML IN DEXTROSE (ISO-OSMOTIC) IVPB
2 gram/50 mL | INTRAVENOUS | Status: DC
Start: 2018-04-01 — End: 2018-04-01

## 2018-04-01 MED ORDER — SCOPOLAMINE (1.3-1.5) MG 72 HR TRANSDERM PATCH
1 mg over 3 days | Freq: Once | TRANSDERMAL | Status: DC
Start: 2018-04-01 — End: 2018-04-01

## 2018-04-01 MED ORDER — FAMOTIDINE (PF) 20 MG/2 ML IV
20 mg/2 mL | Freq: Once | INTRAVENOUS | Status: DC
Start: 2018-04-01 — End: 2018-04-01

## 2018-04-01 MED ORDER — MIDAZOLAM 1 MG/ML IJ SOLN
1 mg/mL | INTRAMUSCULAR | Status: DC | PRN
Start: 2018-04-01 — End: 2018-04-01
  Administered 2018-04-01: 17:00:00 via INTRAVENOUS

## 2018-04-01 MED ORDER — PHENYLEPHRINE 10 MG/ML INJECTION
10 mg/mL | INTRAMUSCULAR | Status: DC | PRN
Start: 2018-04-01 — End: 2018-04-01
  Administered 2018-04-01 (×2): via INTRAVENOUS

## 2018-04-01 MED ORDER — ONDANSETRON (PF) 4 MG/2 ML INJECTION
4 mg/2 mL | INTRAMUSCULAR | Status: AC
Start: 2018-04-01 — End: ?

## 2018-04-01 MED ORDER — DIPHENHYDRAMINE HCL 50 MG/ML IJ SOLN
50 mg/mL | Freq: Four times a day (QID) | INTRAMUSCULAR | Status: DC | PRN
Start: 2018-04-01 — End: 2018-04-01

## 2018-04-01 MED ORDER — NEOSTIGMINE METHYLSULFATE 5 MG/5 ML (1 MG/ML) IV SYRINGE
5 mg/ mL (1 mg/mL) | INTRAVENOUS | Status: DC | PRN
Start: 2018-04-01 — End: 2018-04-01
  Administered 2018-04-01: 18:00:00 via INTRAVENOUS

## 2018-04-01 MED ORDER — LIDOCAINE (PF) 20 MG/ML (2 %) IJ SOLN
20 mg/mL (2 %) | INTRAMUSCULAR | Status: AC
Start: 2018-04-01 — End: ?

## 2018-04-01 MED ORDER — LACTATED RINGERS IV
INTRAVENOUS | Status: DC
Start: 2018-04-01 — End: 2018-04-01

## 2018-04-01 MED ORDER — GLUCAGON 1 MG INJECTION
1 mg | INTRAMUSCULAR | Status: DC | PRN
Start: 2018-04-01 — End: 2018-04-01

## 2018-04-01 MED ORDER — VECURONIUM BROMIDE 10 MG IV SOLR
10 mg | INTRAVENOUS | Status: AC
Start: 2018-04-01 — End: ?

## 2018-04-01 MED ORDER — LIDOCAINE-EPINEPHRINE 0.5 %-1:200,000 IJ SOLN
0.5 %-1:200,000 | INTRAMUSCULAR | Status: DC | PRN
Start: 2018-04-01 — End: 2018-04-01
  Administered 2018-04-01 (×2): via SUBCUTANEOUS

## 2018-04-01 MED ORDER — SODIUM CHLORIDE 0.9 % IJ SYRG
Freq: Three times a day (TID) | INTRAMUSCULAR | Status: DC
Start: 2018-04-01 — End: 2018-04-01

## 2018-04-01 MED ORDER — GLYCOPYRROLATE 0.2 MG/ML IJ SOLN
0.2 mg/mL | INTRAMUSCULAR | Status: DC | PRN
Start: 2018-04-01 — End: 2018-04-01
  Administered 2018-04-01: 18:00:00 via INTRAVENOUS

## 2018-04-01 MED ORDER — NEOSTIGMINE METHYLSULFATE 3 MG/3 ML (1 MG/ML) IV SYRINGE
3 mg/ mL (1 mg/mL) | INTRAVENOUS | Status: AC
Start: 2018-04-01 — End: ?

## 2018-04-01 MED ORDER — GLUCOSE 4 GRAM CHEWABLE TAB
4 gram | ORAL | Status: DC | PRN
Start: 2018-04-01 — End: 2018-04-01

## 2018-04-01 MED ORDER — FAMOTIDINE 20 MG TAB
20 mg | ORAL | Status: AC
Start: 2018-04-01 — End: 2018-04-01
  Administered 2018-04-01: 15:00:00

## 2018-04-01 MED ORDER — WATER FOR INJECTION, STERILE INJECTION
INTRAMUSCULAR | Status: AC
Start: 2018-04-01 — End: ?

## 2018-04-01 MED ORDER — HYDROGEN PEROXIDE 3 % SOLN
3 % | Status: DC | PRN
Start: 2018-04-01 — End: 2018-04-01
  Administered 2018-04-01: 17:00:00

## 2018-04-01 MED ORDER — EPHEDRINE 50 MG/5 ML (10 MG/ML) IN NS IV SYRINGE
50 mg/5 mL (10 mg/mL) | INTRAVENOUS | Status: AC
Start: 2018-04-01 — End: ?

## 2018-04-01 MED ORDER — LIDOCAINE-EPINEPHRINE 0.5 %-1:200,000 IJ SOLN
0.5 %-1:200,000 | INTRAMUSCULAR | Status: AC
Start: 2018-04-01 — End: ?

## 2018-04-01 MED ORDER — ONDANSETRON (PF) 4 MG/2 ML INJECTION
4 mg/2 mL | INTRAMUSCULAR | Status: DC | PRN
Start: 2018-04-01 — End: 2018-04-01
  Administered 2018-04-01: 18:00:00 via INTRAVENOUS

## 2018-04-01 MED ORDER — HYDROMORPHONE 0.5 MG/0.5 ML SYRINGE
0.5 mg/ mL | INTRAMUSCULAR | Status: DC | PRN
Start: 2018-04-01 — End: 2018-04-01

## 2018-04-01 MED ORDER — PROPOFOL 10 MG/ML IV EMUL
10 mg/mL | INTRAVENOUS | Status: AC
Start: 2018-04-01 — End: ?

## 2018-04-01 MED ORDER — MIDAZOLAM 1 MG/ML IJ SOLN
1 mg/mL | INTRAMUSCULAR | Status: AC
Start: 2018-04-01 — End: ?

## 2018-04-01 MED ORDER — GLYCOPYRROLATE 0.2 MG/ML IJ SOLN
0.2 mg/mL | INTRAMUSCULAR | Status: AC
Start: 2018-04-01 — End: ?

## 2018-04-01 MED ORDER — DEXAMETHASONE SODIUM PHOSPHATE 4 MG/ML IJ SOLN
4 mg/mL | INTRAMUSCULAR | Status: DC | PRN
Start: 2018-04-01 — End: 2018-04-01
  Administered 2018-04-01: 17:00:00 via INTRAVENOUS

## 2018-04-01 MED ORDER — OXYCODONE-ACETAMINOPHEN 5 MG-325 MG TAB
5-325 mg | Freq: Once | ORAL | Status: AC | PRN
Start: 2018-04-01 — End: 2018-04-01
  Administered 2018-04-01: 21:00:00 via ORAL

## 2018-04-01 MED ORDER — INSULIN LISPRO 100 UNIT/ML INJECTION
100 unit/mL | Freq: Once | SUBCUTANEOUS | Status: DC
Start: 2018-04-01 — End: 2018-04-01

## 2018-04-01 MED ORDER — EPHEDRINE SULFATE 50 MG/ML INJECTION SOLUTION
50 mg/mL | INTRAMUSCULAR | Status: DC | PRN
Start: 2018-04-01 — End: 2018-04-01
  Administered 2018-04-01: 18:00:00 via INTRAVENOUS

## 2018-04-01 MED ORDER — VECURONIUM BROMIDE 10 MG IV SOLR
10 mg | INTRAVENOUS | Status: DC | PRN
Start: 2018-04-01 — End: 2018-04-01
  Administered 2018-04-01: 17:00:00 via INTRAVENOUS

## 2018-04-01 MED ORDER — PROPOFOL 10 MG/ML IV EMUL
10 mg/mL | INTRAVENOUS | Status: DC | PRN
Start: 2018-04-01 — End: 2018-04-01
  Administered 2018-04-01 (×2): via INTRAVENOUS

## 2018-04-01 MED ORDER — HYDROMORPHONE 0.5 MG/0.5 ML SYRINGE
0.5 mg/ mL | INTRAMUSCULAR | Status: DC | PRN
Start: 2018-04-01 — End: 2018-04-01
  Administered 2018-04-01: 20:00:00 via INTRAVENOUS

## 2018-04-01 MED ORDER — DEXTROSE 10% IN WATER (D10W) IV
10 % | INTRAVENOUS | Status: DC | PRN
Start: 2018-04-01 — End: 2018-04-01

## 2018-04-01 MED ORDER — LIDOCAINE (PF) 20 MG/ML (2 %) IJ SOLN
20 mg/mL (2 %) | INTRAMUSCULAR | Status: DC | PRN
Start: 2018-04-01 — End: 2018-04-01
  Administered 2018-04-01: 17:00:00 via INTRAVENOUS

## 2018-04-01 MED ORDER — LACTATED RINGERS IV
INTRAVENOUS | Status: DC
Start: 2018-04-01 — End: 2018-04-01
  Administered 2018-04-01 (×2): via INTRAVENOUS

## 2018-04-01 MED ORDER — FENTANYL CITRATE (PF) 50 MCG/ML IJ SOLN
50 mcg/mL | INTRAMUSCULAR | Status: DC | PRN
Start: 2018-04-01 — End: 2018-04-01
  Administered 2018-04-01 (×3): via INTRAVENOUS

## 2018-04-01 MED FILL — NEOSTIGMINE METHYLSULFATE 3 MG/3 ML (1 MG/ML) IV SYRINGE: 3 mg/ mL (1 mg/mL) | INTRAVENOUS | Qty: 3

## 2018-04-01 MED FILL — WATER FOR INJECTION, STERILE INJECTION: INTRAMUSCULAR | Qty: 10

## 2018-04-01 MED FILL — LACTATED RINGERS IV: INTRAVENOUS | Qty: 1000

## 2018-04-01 MED FILL — VECURONIUM BROMIDE 10 MG IV SOLR: 10 mg | INTRAVENOUS | Qty: 10

## 2018-04-01 MED FILL — BD POSIFLUSH NORMAL SALINE 0.9 % INJECTION SYRINGE: INTRAMUSCULAR | Qty: 40

## 2018-04-01 MED FILL — PROPOFOL 10 MG/ML IV EMUL: 10 mg/mL | INTRAVENOUS | Qty: 20

## 2018-04-01 MED FILL — FENTANYL CITRATE (PF) 50 MCG/ML IJ SOLN: 50 mcg/mL | INTRAMUSCULAR | Qty: 2

## 2018-04-01 MED FILL — OXYCODONE-ACETAMINOPHEN 5 MG-325 MG TAB: 5-325 mg | ORAL | Qty: 1

## 2018-04-01 MED FILL — FAMOTIDINE (PF) 20 MG/2 ML IV: 20 mg/2 mL | INTRAVENOUS | Qty: 2

## 2018-04-01 MED FILL — GLYCOPYRROLATE 0.2 MG/ML IJ SOLN: 0.2 mg/mL | INTRAMUSCULAR | Qty: 2

## 2018-04-01 MED FILL — DEXTROSE 10% IN WATER (D10W) IV: 10 % | INTRAVENOUS | Qty: 250

## 2018-04-01 MED FILL — TRANSDERM-SCOP 1 MG OVER 3 DAYS TRANSDERMAL PATCH: 1 mg over 3 days | TRANSDERMAL | Qty: 1

## 2018-04-01 MED FILL — XYLOCAINE WITH EPINEPHRINE 0.5 %-1:200,000 INJECTION SOLUTION: 0.5 %-1:200,000 | INTRAMUSCULAR | Qty: 100

## 2018-04-01 MED FILL — HYDROMORPHONE 0.5 MG/0.5 ML SYRINGE: 0.5 mg/ mL | INTRAMUSCULAR | Qty: 0.5

## 2018-04-01 MED FILL — CEFAZOLIN 2 GM/50 ML IN DEXTROSE (ISO-OSMOTIC) IVPB: 2 gram/50 mL | INTRAVENOUS | Qty: 50

## 2018-04-01 MED FILL — FAMOTIDINE 20 MG TAB: 20 mg | ORAL | Qty: 1

## 2018-04-01 MED FILL — XYLOCAINE-MPF 20 MG/ML (2 %) INJECTION SOLUTION: 20 mg/mL (2 %) | INTRAMUSCULAR | Qty: 5

## 2018-04-01 MED FILL — PHENYLEPHRINE 1 MG/10 ML (100 MCG/ML) IN NS IV SYRINGE: 1 mg/0 mL (00 mcg/mL) | INTRAVENOUS | Qty: 10

## 2018-04-01 MED FILL — ONDANSETRON (PF) 4 MG/2 ML INJECTION: 4 mg/2 mL | INTRAMUSCULAR | Qty: 2

## 2018-04-01 MED FILL — MAPAP EXTRA STRENGTH 500 MG TABLET: 500 mg | ORAL | Qty: 1

## 2018-04-01 MED FILL — MIDAZOLAM 1 MG/ML IJ SOLN: 1 mg/mL | INTRAMUSCULAR | Qty: 2

## 2018-04-01 MED FILL — CLINDAMYCIN IN D5W 900 MG/50 ML IV PIGGY BACK: 900 mg/50 mL | INTRAVENOUS | Qty: 50

## 2018-04-01 MED FILL — EPHEDRINE 50 MG/5 ML (10 MG/ML) IN NS IV SYRINGE: 50 mg/5 mL (10 mg/mL) | INTRAVENOUS | Qty: 5

## 2018-04-01 NOTE — Interval H&P Note (Signed)
1516: Received PT from OR via stretcher, Pt drowsy, O2 mask applied at 8L. Pt in no acute distress at this time.    1615: Report given to phase 2 nurse, Andee Poles, RN. Awaiting discharge orders from Dr. Ronnald Ramp. Pt in no acute distress. Dr. Ronnald Ramp stated patient is being Discharged home.

## 2018-04-01 NOTE — Anesthesia Pre-Procedure Evaluation (Signed)
Anesthesia Preprocedure Evaluation by Laurena Slimmer, MD at 04/01/18 1013                Author: Laurena Slimmer, MD  Service: ANESTHESIOLOGY  Author Type: Physician       Filed: 04/01/18 1015  Date of Service: 04/01/18 1013  Status: Signed          Editor: Laurena Slimmer, MD (Physician)                 Relevant Problems       No relevant active problems           Anesthetic History       PONV              Review of Systems / Medical History   Patient summary reviewed and pertinent labs reviewed          Pulmonary   Within defined limits                          Neuro/Psych             Psychiatric history        Cardiovascular   Within defined limits   Hypertension                     Exercise tolerance: >4 METS            GI/Hepatic/Renal   Within defined limits                     Endo/Other      Diabetes:  type 2      Cancer (breast ca s/p chemo, RT)        Other Findings                     Physical Exam          Airway   Mallampati: II   TM Distance: 4 - 6 cm   Neck ROM: normal range of motion    Mouth opening: Normal        Cardiovascular   Regular rate and rhythm,  S1 and S2 normal,  no murmur, click, rub, or gallop   Rhythm: regular   Rate: normal              Dental      Dentition: Lower dentition intact              Pulmonary   Breath sounds clear to auscultation                       Abdominal    GI exam deferred           Other Findings                  Anesthetic Plan      ASA: 3   Anesthesia type: general               Induction: Intravenous   Anesthetic plan and risks discussed with: Patient

## 2018-04-01 NOTE — Interval H&P Note (Signed)
 Phase 2 Recovery Summary  Patient arrived to Phase 2 at 1430  Report received from [facility]  Vitals:    04/01/18 1531 04/01/18 1546 04/01/18 1601 04/01/18 1628   BP: 139/76 171/88 168/83 173/89   Pulse: 92 (!) 102 83 99   Resp: 15 11 10 16    Temp:   97.8 F (36.6 C)    SpO2: 99% 97% 98% 97%   Weight:       Height:           oriented to time, place, person and situation    Lines and Drains  Peripheral Intravenous Line:   Peripheral IV 04/01/18 Left Arm (Active)   Site Assessment Clean, dry, & intact 04/01/2018  4:28 PM   Phlebitis Assessment 0 04/01/2018  4:28 PM   Infiltration Assessment 0 04/01/2018  4:28 PM   Dressing Status Clean, dry, & intact 04/01/2018  4:28 PM   Dressing Type Tape;Transparent 04/01/2018  4:28 PM   Hub Color/Line Status Pink 04/01/2018  4:28 PM   Action Taken Open ports on tubing capped 04/01/2018  4:01 PM       Wound  Wound Breast (Active)   Number of days: 295       Wound Breast Left (Active)   Number of days: 295       Wound Breast Left (Active)   Number of days: 266       Wound Breast Left (Active)   Number of days: 196       Wound Breast (Active)   Number of days: 196       Wound Breast (Active)   Dressing Status Clean, dry, and intact 04/01/2018  4:30 PM   Dressing Type Sutures;Topical skin adhesive/glue;Other (Comment);Transparent film 04/01/2018  4:30 PM   Incision Site Well Approximated Yes 04/01/2018  4:30 PM   Drainage Amount Other (Comment) 04/01/2018  4:30 PM   Drainage Color Sanguinous 04/01/2018  4:30 PM   Number of days: 0     Patient arrived to phase 2 from [facility]. Pt denies  N/V or dizziness. Pt c/o of 4/10 pain. Pt ambulated from bed to chair with minimal assistance. Pt tolerated crackers. Percocet administered per MAR. IV removed. Pt allowed to get dressed.      Dr. Joshua came to speak to pt and husband. Provided pt with percocet RX. Pt c/o H/A, pt's BP continues to be elevated. Contacted anesthesia Dr Blinda and Dr. Hoy came to evaluate pt prior to discharge. Dr. Blinda gave verbal order  for APAP 500mg , administered per [facility].    Discharge instructions reviewed with pt and husband. Husband signed for discharge. Pt brought down via wheelchair.       Patient discharged to home with husband  at [address] [place]

## 2018-04-01 NOTE — H&P (Signed)
Date of Surgery Update:  Barbara [name] was seen and examined.  Patient's history reviewed and patient re-examined without finding any changes     Signed By: [name], MD     April 01, 2018 12:15 PM

## 2018-04-01 NOTE — Interval H&P Note (Signed)
 Pre-Op Summary    Pt arrived via car with family/friend and is oriented to time, place, person and situation. Patient with steady gait with none assistive devices.     Visit Vitals  BP 127/85 (BP 1 Location: Left arm, BP Patient Position: At rest)   Pulse 74   Temp 98.6 F (37 C)   Resp 18   Ht 5' 2 (1.575 m)   Wt 60.5 kg (133 lb 6 oz)   SpO2 100%   BMI 24.39 kg/m       Peripheral IV located on Left forearm.    Patients belongings are located with family waiting.     Patient's point of contact is her husband Alm and their contact number is: [phone].  They will be in the waiting room. They are able to receive medication information. They will be their ride home.

## 2018-04-01 NOTE — Op Note (Signed)
[facility]  OPERATIVE REPORT    Name:  MR#:  DOB:  ACCOUNT #:  DATE OF SERVICE:    PREOPERATIVE DIAGNOSIS:  History of malignant neoplasm of left breast.    POSTOPERATIVE DIAGNOSIS:  Left breast malignant neoplasm with bilateral reconstruction.    PROCEDURE PERFORMED:  Removal of tissue expanders on both sides with placement of right permanent breast prosthesis.    SURGEON:  [name], MD    ASSISTANT:  SA, Maeola Sarah and [place].    ANESTHESIA:  General.    COMPLICATIONS:  None.    SPECIMENS REMOVED:  Lest breast transverse scar.  Previous history of breast cancer on that side and left and right breast tissue expanders.    IMPLANTS:  205 mL textured Allergan cohesive anatomical breast implant.    ESTIMATED BLOOD LOSS:  Minimal.    FINDINGS:  No sign of infection.    PROCEDURE:  The patient was placed on the operating table in the supine position.  The left transverse breast scar was opened by excision of the scar itself and this was sent to pathology marked as to its polarity.  This laid down on to the pectoralis major muscle which was divided and brought out to its fibers and the underlying tissue expander was carefully removed.  There was no sign of any infection.  The capsule was left draining into a #15 Blake drain which was taken out through the axilla and secured with 3-0 silk.  The wound was closed after hemostasis had been secured by electrocautery in 3 layers using 3-0 PDS suture for the deep layer of capsule.  Thereafter, 3-0 PDS were used for the deep dermis and a 5-0 continuous PDS suture used for the epidermal approximation.    On right hand side, the transverse incision was opened and the tissue expander removed.  There was considerable thinning of the capsule and indeed the subcutaneous tissues medially and this was then plicated with interrupted 3-0 Vicryl sutures.  The capsule was released laterally to ensure that the breast tissues would remain in that position.    The Allergan  205 mL form-stable breast prosthesis was then introduced.  The surface was textured.  The wound was drained with another 15 Blake drain, taken out through the axilla and secured with 3-0 silk suture.  The wound on the right was closed in exactly the same way as the wound on the left and both wounds were secured with a Bioclusive dressing.  The patient was returned to recovery room in stable condition.  The patient will be admitted to the hospital for a 23-hour period of observation.            //  D:  T:  JOB #:  CC:

## 2018-04-01 NOTE — Op Note (Signed)
BRIEF OPERATIVE NOTE    Date of Procedure: 04/01/2018   Preoperative Diagnosis: z85.36 hx of malignant neoplasm of breast  Postoperative Diagnosis: z85.36 hx of malignant neoplasm of breast      Procedure(s):  removal of tissue expanders with placment of right permanent breast prostesis    Surgeon(s) and Role:     [name], MD - Primary         Surgical Assistant: [place]    Surgical Staff:  Circ-1: Con Memos, RN  Circ-2: Jodean Lima Breck Coons  Circ-Relief: Clarise Cruz  Scrub Tech-1: Thayer Ohm, Felicita  Scrub Tech-Relief: Griselda Miner  Surg Asst-1: Cleora Fleet B  Surg Asst-2: Karie Mainland  Event Time In Time Out   Incision Start 1312    Incision Close 1501      Anesthesia: General   Estimated Blood Loss: minimal  Specimens:   ID Type Source Tests Collected by Time Destination   1 : LEFT BREAST TRANSVERSE SCAR, PREVIOUS HISTORY OF BREAST CANCER, SUTURE MEDIAL POLE AT [place]   [name], MD 04/01/2018 1404 Pathology   2 : LEFT BREAST IMPLANT Other   [name], MD 04/01/2018 1359 Pathology   3 : RIGHT BREAST IMPLANT Other   [name], MD 04/01/2018 1414 Pathology      Findings: no sign of infection   Complications: none  Implants:   Implant Name Type Inv. Item Serial No. Manufacturer Lot No. LRB No. Used Action   NATRELLE  410 HIGHLY COHESIVE ANATOMICALLY SHAPED SILICONE-FILLED BREAST IMPLANT [id number]   [id number] ALLERGAN N/A Right 1 Implanted

## 2018-04-01 NOTE — Op Note (Signed)
[facility]  OPERATIVE REPORT    Name:  MR#:  DOB:  ACCOUNT #:  DATE OF SERVICE:    PREOPERATIVE DIAGNOSIS:  History of malignant neoplasm of left breast.    POSTOPERATIVE DIAGNOSIS:  Left breast malignant neoplasm with bilateral reconstruction.    PROCEDURE PERFORMED:  Removal of tissue expanders on both sides with placement of right permanent breast prosthesis.    SURGEON:  [name], MD    ASSISTANT:  SA, Maeola Sarah and [place].    ANESTHESIA:  General.    COMPLICATIONS:  None.    SPECIMENS REMOVED:  Lest breast transverse scar.  Previous history of breast cancer on that side and left and right breast tissue expanders.    IMPLANTS:  205 mL textured Allergan cohesive anatomical breast implant.    ESTIMATED BLOOD LOSS:  Minimal.    FINDINGS:  No sign of infection.    PROCEDURE:  The patient was placed on the operating table in the supine position.  The left transverse breast scar was opened by excision of the scar itself and this was sent to pathology marked as to its polarity.  This laid down on to the pectoralis major muscle which was divided and brought out to its fibers and the underlying tissue expander was carefully removed.  There was no sign of any infection.  The capsule was left draining into a #15 Blake drain which was taken out through the axilla and secured with 3-0 silk.  The wound was closed after hemostasis had been secured by electrocautery in 3 layers using 3-0 PDS suture for the deep layer of capsule.  Thereafter, 3-0 PDS were used for the deep dermis and a 5-0 continuous PDS suture used for the epidermal approximation.    On right hand side, the transverse incision was opened and the tissue expander removed.  There was considerable thinning of the capsule and indeed the subcutaneous tissues medially and this was then plicated with interrupted 3-0 Vicryl sutures.  The capsule was released laterally to ensure that the breast tissues would remain in that position.     The Allergan 205 mL form-stable breast prosthesis was then introduced.  The surface was textured.  The wound was drained with another 15 Blake drain, taken out through the axilla and secured with 3-0 silk suture.  The wound on the right was closed in exactly the same way as the wound on the left and both wounds were secured with a Bioclusive dressing.  The patient was returned to recovery room in stable condition.  The patient will be admitted to the hospital for a 23-hour period of observation.            //  D:  T:  JOB #:  CC:

## 2018-04-01 NOTE — Anesthesia Pre-Procedure Evaluation (Signed)
Relevant Problems   No relevant active problems       Anesthetic History     PONV          Review of Systems / Medical History  Patient summary reviewed and pertinent labs reviewed    Pulmonary  Within defined limits                 Neuro/Psych         Psychiatric history     Cardiovascular  Within defined limits  Hypertension              Exercise tolerance: >4 METS     GI/Hepatic/Renal  Within defined limits              Endo/Other    Diabetes: type 2    Cancer (breast ca s/p chemo, RT)     Other Findings              Physical Exam    Airway  Mallampati: II  TM Distance: 4 - 6 cm  Neck ROM: normal range of motion   Mouth opening: Normal     Cardiovascular  Regular rate and rhythm,  S1 and S2 normal,  no murmur, click, rub, or gallop  Rhythm: regular  Rate: normal         Dental    Dentition: Lower dentition intact     Pulmonary  Breath sounds clear to auscultation               Abdominal  GI exam deferred       Other Findings            Anesthetic Plan    ASA: 3  Anesthesia type: general          Induction: Intravenous  Anesthetic plan and risks discussed with: Patient

## 2018-04-01 NOTE — Other (Signed)
Pre-Op Summary    Pt arrived via car with family/friend and is oriented to time, place, person and situation. Patient with steady gait with none assistive devices.     Visit Vitals  BP 127/85 (BP 1 Location: Left arm, BP Patient Position: At rest)   Pulse 74   Temp 98.6 ??F (37 ??C)   Resp 18   Ht 5\' 2"  (1.575 m)   Wt 60.5 kg (133 lb 6 oz)   SpO2 100%   BMI 24.39 kg/m??       Peripheral IV located on Left forearm.    Patients belongings are located with family waiting.     Patient's point of contact is her husband [name] and their contact number is: [phone].  They will be in the waiting room. They are able to receive medication information. They will be their ride home.

## 2018-04-01 NOTE — Brief Op Note (Signed)
BRIEF OPERATIVE NOTE    Date of Procedure: 04/01/2018   Preoperative Diagnosis: z85.36 hx of malignant neoplasm of breast  Postoperative Diagnosis: z85.36 hx of malignant neoplasm of breast      Procedure(s):  removal of tissue expanders with placment of right permanent breast prostesis    Surgeon(s) and Role:     [name], MD - Primary         Surgical Assistant: [place]    Surgical Staff:  Circ-1: Con Memos, RN  Circ-2: Jodean Lima Breck Coons  Circ-Relief: Clarise Cruz  Scrub Tech-1: Thayer Ohm, Felicita  Scrub Tech-Relief: Griselda Miner  Surg Asst-1: Cleora Fleet B  Surg Asst-2: Karie Mainland  Event Time In Time Out   Incision Start 1312    Incision Close 1501      Anesthesia: General   Estimated Blood Loss: minimal  Specimens:   ID Type Source Tests Collected by Time Destination   1 : LEFT BREAST TRANSVERSE SCAR, PREVIOUS HISTORY OF BREAST CANCER, SUTURE MEDIAL POLE AT [place]   [name], MD 04/01/2018 1404 Pathology   2 : LEFT BREAST IMPLANT Other   [name], MD 04/01/2018 1359 Pathology   3 : RIGHT BREAST IMPLANT Other   [name], MD 04/01/2018 1414 Pathology      Findings: no sign of infection   Complications: none  Implants:   Implant Name Type Inv. Item Serial No. Manufacturer Lot No. LRB No. Used Action   NATRELLE  410 HIGHLY COHESIVE ANATOMICALLY SHAPED SILICONE-FILLED BREAST IMPLANT [id number]   [id number] ALLERGAN N/A Right 1 Implanted

## 2018-04-01 NOTE — Other (Signed)
Phase 2 Recovery Summary  Patient arrived to Phase 2 at 1430  Report received from [facility]:    04/01/18 1531 04/01/18 1546 04/01/18 1601 04/01/18 1628   BP: 139/76 171/88 168/83 173/89   Pulse: 92 (!) 102 83 99   Resp: 15 11 10 16    Temp:   97.8 ??F (36.6 ??C)    SpO2: 99% 97% 98% 97%   Weight:       Height:           oriented to time, place, person and situation    Lines and Drains  Peripheral Intravenous Line:   Peripheral IV 04/01/18 Left Arm (Active)   Site Assessment Clean, dry, & intact 04/01/2018  4:28 PM   Phlebitis Assessment 0 04/01/2018  4:28 PM   Infiltration Assessment 0 04/01/2018  4:28 PM   Dressing Status Clean, dry, & intact 04/01/2018  4:28 PM   Dressing Type Tape;Transparent 04/01/2018  4:28 PM   Hub Color/Line Status Pink 04/01/2018  4:28 PM   Action Taken Open ports on tubing capped 04/01/2018  4:01 PM       Wound  Wound Breast (Active)   Number of days: 295       Wound Breast Left (Active)   Number of days: 295       Wound Breast Left (Active)   Number of days: 266       Wound Breast Left (Active)   Number of days: 196       Wound Breast (Active)   Number of days: 196       Wound Breast (Active)   Dressing Status Clean, dry, and intact 04/01/2018  4:30 PM   Dressing Type Sutures;Topical skin adhesive/glue;Other (Comment);Transparent film 04/01/2018  4:30 PM   Incision Site Well Approximated Yes 04/01/2018  4:30 PM   Drainage Amount Other (Comment) 04/01/2018  4:30 PM   Drainage Color Sanguinous 04/01/2018  4:30 PM   Number of days: 0     Patient arrived to phase 2 from [facility]. Pt denies  N/V or dizziness. Pt c/o of 4/10 pain. Pt ambulated from bed to chair with minimal assistance. Pt tolerated crackers. Percocet administered per MAR. IV removed. Pt allowed to get dressed.      Dr. Ronnald Ramp came to speak to pt and husband. Provided pt with percocet RX. Pt c/o H/A, pt's BP continues to be elevated. Contacted anesthesia Dr Juluis Rainier and Dr. Cathlyn Parsons came to evaluate pt prior to discharge. Dr. Juluis Rainier gave  verbal order for APAP 500mg , administered per [facility].    Discharge instructions reviewed with pt and husband. Husband signed for discharge. Pt brought down via wheelchair.       Patient discharged to home with husband  at [place]

## 2018-04-01 NOTE — Other (Addendum)
1516: Received PT from OR via stretcher, Pt drowsy, O2 mask applied at 8L. Pt in no acute distress at this time.    1615: Report given to phase 2 nurse, Andee Poles, RN. Awaiting discharge orders from Dr. Ronnald Ramp. Pt in no acute distress. Dr. Ronnald Ramp stated patient is being Discharged home.

## 2018-04-02 NOTE — Anesthesia Post-Procedure Evaluation (Signed)
Procedure(s):  removal of tissue expanders with placment of right permanent breast prostesis.    general    Anesthesia Post Evaluation      Multimodal analgesia: multimodal analgesia used between 6 hours prior to anesthesia start to PACU discharge  Patient location during evaluation: bedside  Patient participation: complete - patient participated  Level of consciousness: awake  Pain management: adequate  Airway patency: patent  Anesthetic complications: no  Cardiovascular status: stable  Respiratory status: acceptable  Hydration status: acceptable  Post anesthesia nausea and vomiting:  controlled      Vitals Value Taken Time   BP 168/83 04/01/2018  4:01 PM   Temp 36.6 ??C (97.8 ??F) 04/01/2018  4:01 PM   Pulse 102 04/01/2018  4:10 PM   Resp 13 04/01/2018  4:10 PM   SpO2 98 % 04/01/2018  4:10 PM   Vitals shown include unvalidated device data.

## 2018-04-03 NOTE — Telephone Encounter (Signed)
Pt. Needs a referral sent to Dr. Beverly Sessions Guy's office dated for 03/17/18 and 02/27/18. Please assist.

## 2018-04-03 NOTE — Telephone Encounter (Signed)
Is authorization needed.

## 2018-04-06 MED FILL — GLYCOPYRROLATE 0.2 MG/ML IJ SOLN: 0.2 mg/mL | INTRAMUSCULAR | Qty: 0.3

## 2018-04-06 MED FILL — PHENYLEPHRINE 1 MG/10 ML (100 MCG/ML) IN NS IV SYRINGE: 1 mg/0 mL (00 mcg/mL) | INTRAVENOUS | Qty: 200

## 2018-04-06 MED FILL — VECURONIUM BROMIDE 10 MG IV SOLR: 10 mg | INTRAVENOUS | Qty: 6

## 2018-04-06 MED FILL — EPHEDRINE 50 MG/5 ML (10 MG/ML) IN NS IV SYRINGE: 50 mg/5 mL (10 mg/mL) | INTRAVENOUS | Qty: 10

## 2018-04-06 MED FILL — DEXAMETHASONE SODIUM PHOSPHATE 4 MG/ML IJ SOLN: 4 mg/mL | INTRAMUSCULAR | Qty: 2

## 2018-04-06 MED FILL — LACTATED RINGERS IV: INTRAVENOUS | Qty: 600

## 2018-04-06 MED FILL — ONDANSETRON (PF) 4 MG/2 ML INJECTION: 4 mg/2 mL | INTRAMUSCULAR | Qty: 2

## 2018-04-06 MED FILL — CLEOCIN 900 MG/50 ML IN 5 % DEXTROSE INTRAVENOUS PIGGYBACK: 900 mg/50 mL | INTRAVENOUS | Qty: 50

## 2018-04-06 MED FILL — NEOSTIGMINE METHYLSULFATE 3 MG/3 ML (1 MG/ML) IV SYRINGE: 3 mg/ mL (1 mg/mL) | INTRAVENOUS | Qty: 3

## 2018-04-06 MED FILL — PROPOFOL 10 MG/ML IV EMUL: 10 mg/mL | INTRAVENOUS | Qty: 150

## 2018-04-06 MED FILL — XYLOCAINE-MPF 20 MG/ML (2 %) INJECTION SOLUTION: 20 mg/mL (2 %) | INTRAMUSCULAR | Qty: 80

## 2018-04-20 ENCOUNTER — Ambulatory Visit: Attending: Surgery | Primary: Nurse Practitioner

## 2018-04-20 ENCOUNTER — Ambulatory Visit
Admit: 2018-04-20 | Discharge: 2018-04-20 | Payer: PRIVATE HEALTH INSURANCE | Attending: Surgery | Primary: Nurse Practitioner

## 2018-04-20 DIAGNOSIS — C50412 Malignant neoplasm of upper-outer quadrant of left female breast: Secondary | ICD-10-CM

## 2018-04-20 DIAGNOSIS — Z170 Estrogen receptor positive status [ER+]: Secondary | ICD-10-CM

## 2018-04-20 NOTE — Progress Notes (Signed)
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 Ronnald Ramp 06/10/17 as well as a history of right breast cancer in 2008.  Her Oncotype returned as 13 with an 8% 5 year recurrence with Tamoxifen alone. She has been started on ovarian suppression and  aromatase inhibitor (Lupron and Femara) per Dr. Brent General.       She presented August 2018  for evaluation regarding a new diagnosis of a left breast cancer.  The patient had a recent left breast mammogram that showed an area of calcifications and possibly small mass in about the 1130 position 3 cm from the nipple  measuring about 1.8 cm.  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  The tumor was estrogen and progesterone receptor positive, HER-2 negative.      Of note in 2008 the patient developed right breast cancer which apparently was fairly large.  She believes she had gene testing at that time when she was 52 years old.  She underwent a mastectomy and had neoadjuvant chemotherapy.  Subsequently she had  radiation therapy.  She apparently had some latissimus dorsi flap reconstruction but this failed and then subsequent to that she had an implant.  Subsequent to that she had a reduction mammoplasty of the left breast.  She has not been aware of any recurrent  disease since her first cancer until the development of this new cancer in the other breast.  She states a pill was not recommended at that time.      She underwent revision of scar  with excision of necrotic tissue and removal of expander by Dr. Ronnald Ramp 07/09/17 and subsequent submuscular placement 09/17/17 followed by removal  of tissue expanders on both sides with placement of right permanent breast prosthesis 04/01/18.         She is extremely pleased with her result.  She is taking Femara and Lupron per Dr. Brent General. She is overall doing well and is without complaint related to her breast.                 Allergies        Allergen  Reactions         ?  Tramadol  Anaphylaxis     ?  Metformin  Diarrhea         ?  Trulicity [Dulaglutide]  Nausea Only          Past Medical History:        Diagnosis  Date         ?  Back pain       ?  Breast cancer ([place])  2008 or 2009     ?  Chronic pain       ?  Diabetes (  [place])       ?  FH: chemotherapy       ?  Hypertension       ?  Nausea & vomiting       ?  Neuropathy       ?  Radiation therapy complication  6333          NOT complication, just radiation          Past Surgical History:         Procedure  Laterality  Date          ?  BIOPSY BREAST         ?  BIOPSY FINE NEEDLE         ?  HX BREAST AUGMENTATION  Right  2010     ?  HX BREAST AUGMENTATION  Left  09/17/2017          left breast reconstruction with exchange of tissue expander per breast prostesis and left breast tissue expander performed by [name],  MD at [facility]          ?  HX BREAST AUGMENTATION  Right  04/01/2018          removal of tissue expanders with placment of right permanent breast prostesis performed by [name], MD at [facility]          ?  HX BREAST RECONSTRUCTION  Right  2011          per pt - attempted latissimus dorsi flap, then saline implant          ?  HX BREAST RECONSTRUCTION  Bilateral  06/10/2017          right breast prosthesis and capsule removal and bilateral breast tissue  expander reconstruction.  performed by [name], MD at [facility]          ?  HX BREAST RECONSTRUCTION  Left  07/09/2017          left breast  EXCISION and closure of wound  performed by [name], MD at [facility]          ?  HX BREAST RECONSTRUCTION  Bilateral            Tissue expanders, to prepare for implants           ?  HX CESAREAN SECTION         ?  HX LUMBAR DISKECTOMY              herniated disc          ?  HX MASTECTOMY  Right  2010          Total mastectomy per pt.            ?  HX MASTECTOMY  Left  06/10/2017          left mastectomy,Sentinal node biopsies. performed by [name], MD at [facility]          ?  HX MASTECTOMY  Bilateral       ?  HX MASTOPEXY (BREAST LIFT)  Left  2011     ?  IMPLANT BREAST SILICONE/EQ  Right  5456          post mastectomy          ?  THUMB SUPPORT              thumb surgery to mobilize thumb  Social History          Socioeconomic History         ?  Marital status:  MARRIED              Spouse name:  Not on file         ?  Number of children:  Not on file     ?  Years of education:  Not on file     ?  Highest education level:  Not on file       Tobacco Use         ?  Smoking status:  Light Tobacco Smoker              Packs/day:  0.25         Last attempt to quit:  01/29/2018         Years since quitting:  0.2         ?  Smokeless tobacco:  Never Used        ?  Tobacco comment: 5-6 cigs per day       Substance and Sexual Activity         ?  Alcohol use:  No     ?  Drug use:  No        Other Topics  Concern        REVIEW OF SYSTEMS       Constitutional: No fever, weight loss, fatigue or recent chills.    Skin:  No recent rashes, dermatitis or abnormal moles.    HEENT:  No changes in vision, vertigo, epistaxis, dysphasia, or hoarseness.    Cardiac:  No chest pain, palpitations, or edema.     Respiratory: No chronic cough, shortness of breath, wheezing, hemoptysis, or history of sleep apnea.  Patient has been a longtime smoker and still smokes about 1 pack of cigarettes per day-probable mild COPD    Breasts/GYN:   See the history of present illness    Gastrointestinal:  No significant food intolerances, no recent vomiting, no  chronic abdominal pain, no change in bowel habits, no melena. No history of GERD.    Genitourinary:  No history of hematuria, dysuria, frequency, or stress urinary incontinence. No nocturia.    Musculoskeletal: No weakness, joint pains, or arthritis.  Patient does have a history of chronic back pain    Endocrine:  No history of thyroid disease.  History of insulin-dependent diabetes with neuropathy.  Patient's previous hemoglobin A1c was over 8    Lymph/hemo:  No history of blood transfusions or easy bruising. No anemia.  Patient states that she had "blood clots" with previous surgeries but she pointed to her chest    Neuro: No dizziness or headaches or fainting.  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      PHYSICAL EXAM      Visit Vitals      BP  (!) 126/92     Pulse  69     Temp  97.4 ??F (36.3 ??C) (Oral)     Resp  18     Ht  5' 2" (1.575 m)     Wt  61.2 kg (135 lb)     SpO2  97%        BMI  24.69 kg/m??               Constitutional:  Well-developed, well-nourished, no acute distress.   Breasts:    Right: Examined in both  the supine and upright positions.  There was no supraclavicular, infraclavicular, or axillary  lympadenopathy.   There were no dominant masses, no skin changes, no asymmetry identified s/p mastectomy, implant intact, incisions clean,  healing well    Left: Examined in both the supine and upright positions.  There was no supraclavicular, infraclavicular, or axillary lympadenopathy.    There were no dominant masses, no skin changes, no asymmetry identified s/p mastectomy, no reconstruction, incisions clean, healing well           Pathology:   06/10/17    A: SENTINEL LYMPH NODE #1, LEFT AXILLA, BIOPSY:    ONE LYMPH NODE, NEGATIVE FOR MALIGNANCY (0/1).    B: SENTINEL LYMPH NODE #2, LEFT AXILLA, BIOPSY:    ONE LYMPH NODE, NEGATIVE FOR MALIGNANCY (0/1).    C: LEFT BREAST, SIMPLE MASTECTOMY:    INVASIVE CARCINOMA OF THE BREAST.    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    Nuclear Grade: Grade II (intermediate)    Lobular Carcinoma In Situ (LCIS): Present    Tumor Extent    Nipple DCIS: DCIS does not involve the nipple epidermis    Accessory Findings    Treatment Effect in the Breast: No known presurgical therapy    Treatment Effect in the Lymph Nodes: No known presurgical therapy    MARGINS    Invasive Carcinoma Margins: Uninvolved by invasive carcinoma    Distance from Closest Margin in Millimeters: Distance is > 10 Millimeters    Closest Margin: Posterior    DCIS Margins: Uninvolved by DCIS    Distance of DCIS from Closest Margin in Millimeters: Distance is > 10 Millimeters    Closest Margin: Posterior    LYMPH NODES    Number of Lymph Nodes with Macrometastases (> 2 mm): 0    Number of Lymph Nodes with Micrometastases (> 0.2 mm to 2 mm and / or > 200 cells): 0    Number of Lymph Nodes with Isolated Tumor Cells (<= 0.2 mm and <= 200 cells): 0    Number of Lymph Nodes Examined: 2 (see above)    Number of Sentinel Nodes Examined: 2    PATHOLOGIC STAGE CLASSIFICATION (pTNM, AJCC 8th Edition)    Primary Tumor (Invasive Carcinoma) (pT): pT1c    Regional Lymph Nodes (pN)    Modifier: (sn): Only sentinel node(s) evaluated.    Category (pN): pN0    D: RIGHT BREAST, CAPSULECTOMY:    BENIGN SOFT TISSUE IDENTIFIED.    E: IMPLANT, RIGHT BREAST, REMOVAL:    BREAST PROSTHESIS IDENTIFIED (GROSS EXAMINATION ONLY).          05/16/17    LEFT BREAST MASS AT 11:30 O'CLOCK (2-3 CM FROM NIPPLE), CORE BIOPSIES:    INFILTRATING CARCINOMA, NUCLEAR GRADE 1, WITH MIXED DUCTAL AND LOBULAR    FEATURES.    ESTROGEN  RECEPTOR: POSITIVE (100%) POSITIVE > 1%    PROGESTERONE RECEPTOR: POSITIVE (100%) POSITIVE > 1%    Negative for Her-2 amplification  Patient Active Problem List        Diagnosis  Code         ?  Essential hypertension  I10     ?  DM type 2, uncontrolled, with neuropathy ([place])  E11.40, E11.65     ?  Pain of left hip joint  M25.552     ?  Low back pain  M54.5     ?  Smoker  F17.200     ?  Dupuytren contracture  M72.0     ?  S/P mastectomy, left  Z90.12     ?  Cigarette nicotine dependence without complication  K55.374     ?  Recurrent depression ([place])  F33.9         ?  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 Ronnald Ramp 06/10/17 as well as a history of right breast cancer in 2008. Continue  ovarian suppression and aromatase inhibitor (Lupron and Femara) per Dr. Brent General.  Continue to follow up with Dr. Ronnald Ramp.   Tobacco cessation again recommended.  Follow up 3 months.  Please call sooner with questions or concerns.

## 2018-04-20 NOTE — Progress Notes (Signed)
1. Have you been to the ER, urgent care clinic since your last visit?  Hospitalized since your last visit?No    2. Have you seen or consulted any other health care providers outside of the [facility] since your last visit?  Include any pap smears or colon screening. Breast surgery with Dr. Yetta Barre.     Patient presents for follow up for left breast cancer.

## 2018-04-20 NOTE — Progress Notes (Signed)
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 Ronnald Ramp 06/10/17 as well as a history of right breast cancer in 2008.  Her Oncotype returned as 13 with an 8% 5 year recurrence with Tamoxifen alone. She has been started on ovarian suppression and aromatase inhibitor (Lupron and Femara) per Dr. Brent General.     She presented August 2018  for evaluation regarding a new diagnosis of a left breast cancer.  The patient had a recent left breast mammogram that showed an area of calcifications and possibly small mass in about the 1130 position 3 cm from the nipple measuring about 1.8 cm.  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  The tumor was estrogen and progesterone receptor positive, HER-2 negative.    Of note in 2008 the patient developed right breast cancer which apparently was fairly large.  She believes she had gene testing at that time when she was 52 years old.  She underwent a mastectomy and had neoadjuvant chemotherapy.  Subsequently she had radiation therapy.  She apparently had some latissimus dorsi flap reconstruction but this failed and then subsequent to that she had an implant.  Subsequent to that she had a reduction mammoplasty of the left breast.  She has not been aware of any recurrent disease since her first cancer until the development of this new cancer in the other breast.  She states a pill was not recommended at that time.    She underwent revision of scar with excision of necrotic tissue and removal of expander by Dr. Ronnald Ramp 07/09/17 and subsequent submuscular placement 09/17/17 followed by removal of tissue expanders on both sides with placement of right permanent breast prosthesis 04/01/18.        She is extremely pleased with her result.  She is taking Femara and Lupron per Dr. Brent General. She is overall doing well and is without complaint related to her breast.          Allergies   Allergen Reactions   ??? Tramadol Anaphylaxis   ??? Metformin Diarrhea   ??? Trulicity [Dulaglutide] Nausea Only     Past Medical History:   Diagnosis Date   ??? Back pain    ??? Breast cancer ([place]) 2008 or 2009   ??? Chronic pain    ??? Diabetes ([place])    ??? FH: chemotherapy    ??? Hypertension    ??? Nausea & vomiting    ??? Neuropathy    ??? Radiation therapy complication 5361    NOT complication, just radiation     Past Surgical History:   Procedure Laterality Date   ??? BIOPSY BREAST     ??? BIOPSY FINE NEEDLE     ??? HX BREAST AUGMENTATION Right 2010   ??? HX BREAST AUGMENTATION Left 09/17/2017    left breast reconstruction with exchange of tissue expander per breast prostesis and left breast tissue expander performed by [name], MD at [facility]   ??? HX BREAST AUGMENTATION Right 04/01/2018    removal of tissue expanders with placment of right permanent breast prostesis performed by [name], MD at [facility] [place]   ??? HX BREAST RECONSTRUCTION Right 2011    per pt - attempted latissimus dorsi flap, then saline implant   ??? HX  BREAST RECONSTRUCTION Bilateral 06/10/2017    right breast prosthesis and capsule removal and bilateral breast tissue  expander reconstruction.  performed by [name], MD at [facility]   ??? HX BREAST RECONSTRUCTION Left 07/09/2017    left breast  EXCISION and closure of wound performed by [name], MD at [facility]   ??? HX BREAST RECONSTRUCTION Bilateral     Tissue expanders, to prepare for implants    ??? HX CESAREAN SECTION     ??? HX LUMBAR DISKECTOMY      herniated disc   ??? HX MASTECTOMY Right 2010    Total mastectomy per pt.     ??? HX MASTECTOMY Left 06/10/2017    left mastectomy,Sentinal node biopsies. performed by [name], [facility] at [facility] MA[facility]   ??? HX MASTECTOMY Bilateral     ??? HX MASTOPEXY (BREAST LIFT) Left 2011   ??? IMPLANT BREAST SILICONE/EQ Right 3790    post mastectomy   ??? THUMB SUPPORT      thumb surgery to mobilize thumb     Social History     Socioeconomic History   ??? Marital status: MARRIED     Spouse name: Not on file   ??? Number of children: Not on file   ??? Years of education: Not on file   ??? Highest education level: Not on file   Tobacco Use   ??? Smoking status: Light Tobacco Smoker     Packs/day: 0.25     Last attempt to quit: 01/29/2018     Years since quitting: 0.2   ??? Smokeless tobacco: Never Used   ??? Tobacco comment: 5-6 cigs per day   Substance and Sexual Activity   ??? Alcohol use: No   ??? Drug use: No   Other Topics Concern     REVIEW OF SYSTEMS     Constitutional: No fever, weight loss, fatigue or recent chills.   Skin:  No recent rashes, dermatitis or abnormal moles.   HEENT:  No changes in vision, vertigo, epistaxis, dysphasia, or hoarseness.   Cardiac:  No chest pain, palpitations, or edema.    Respiratory: No chronic cough, shortness of breath, wheezing, hemoptysis, or history of sleep apnea.  Patient has been a longtime smoker and still smokes about 1 pack of cigarettes per day-probable mild COPD   Breasts/GYN:   See the history of present illness   Gastrointestinal:  No significant food intolerances, no recent vomiting, no chronic abdominal pain, no change in bowel habits, no melena. No history of GERD.   Genitourinary:  No history of hematuria, dysuria, frequency, or stress urinary incontinence. No nocturia.   Musculoskeletal: No weakness, joint pains, or arthritis.  Patient does have a history of chronic back pain   Endocrine:  No history of thyroid disease.  History of insulin-dependent diabetes with neuropathy.  Patient's previous hemoglobin A1c was over 8   Lymph/hemo:  No history of blood transfusions or easy bruising. No anemia.  Patient states that she had "blood clots" with previous surgeries but she pointed to her chest    Neuro: No dizziness or headaches or fainting.  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    PHYSICAL EXAM    Visit Vitals  BP (!) 126/92   Pulse 69   Temp 97.4 ??F (36.3 ??C) (Oral)   Resp 18   Ht 5' 2"  (1.575 m)   Wt 61.2 kg (135 lb)   SpO2 97%   BMI 24.69 kg/m??  Constitutional:  Well-developed, well-nourished, no acute distress.   Breasts:   Right: Examined in both the supine and upright positions.  There was no supraclavicular, infraclavicular, or axillary lympadenopathy.   There were no dominant masses, no skin changes, no asymmetry identified s/p mastectomy, implant intact, incisions clean, healing well   Left: Examined in both the supine and upright positions.  There was no supraclavicular, infraclavicular, or axillary lympadenopathy.   There were no dominant masses, no skin changes, no asymmetry identified s/p mastectomy, no reconstruction, incisions clean, healing well       Pathology:  06/10/17   A: SENTINEL LYMPH NODE #1, LEFT AXILLA, BIOPSY:   ONE LYMPH NODE, NEGATIVE FOR MALIGNANCY (0/1).   B: SENTINEL LYMPH NODE #2, LEFT AXILLA, BIOPSY:   ONE LYMPH NODE, NEGATIVE FOR MALIGNANCY (0/1).   C: LEFT BREAST, SIMPLE MASTECTOMY:   INVASIVE CARCINOMA OF THE BREAST.   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   Nuclear Grade: Grade II (intermediate)   Lobular Carcinoma In Situ (LCIS): Present   Tumor Extent   Nipple DCIS: DCIS does not involve the nipple epidermis   Accessory Findings   Treatment Effect in the Breast: No known presurgical therapy    Treatment Effect in the Lymph Nodes: No known presurgical therapy   MARGINS   Invasive Carcinoma Margins: Uninvolved by invasive carcinoma   Distance from Closest Margin in Millimeters: Distance is > 10 Millimeters   Closest Margin: Posterior   DCIS Margins: Uninvolved by DCIS   Distance of DCIS from Closest Margin in Millimeters: Distance is > 10 Millimeters   Closest Margin: Posterior   LYMPH NODES   Number of Lymph Nodes with Macrometastases (> 2 mm): 0   Number of Lymph Nodes with Micrometastases (> 0.2 mm to 2 mm and / or > 200 cells): 0   Number of Lymph Nodes with Isolated Tumor Cells (<= 0.2 mm and <= 200 cells): 0   Number of Lymph Nodes Examined: 2 (see above)   Number of Sentinel Nodes Examined: 2   PATHOLOGIC STAGE CLASSIFICATION (pTNM, AJCC 8th Edition)   Primary Tumor (Invasive Carcinoma) (pT): pT1c   Regional Lymph Nodes (pN)   Modifier: (sn): Only sentinel node(s) evaluated.   Category (pN): pN0   D: RIGHT BREAST, CAPSULECTOMY:   BENIGN SOFT TISSUE IDENTIFIED.   E: IMPLANT, RIGHT BREAST, REMOVAL:   BREAST PROSTHESIS IDENTIFIED (GROSS EXAMINATION ONLY).       05/16/17   LEFT BREAST MASS AT 11:30 O'CLOCK (2-3 CM FROM NIPPLE), CORE BIOPSIES:   INFILTRATING CARCINOMA, NUCLEAR GRADE 1, WITH MIXED DUCTAL AND LOBULAR   FEATURES.   ESTROGEN RECEPTOR: POSITIVE (100%) POSITIVE > 1%   PROGESTERONE RECEPTOR: POSITIVE (100%) POSITIVE > 1%   Negative for Her-2 amplification       Patient Active Problem List   Diagnosis Code   ??? Essential hypertension I10   ??? DM type 2, uncontrolled, with neuropathy ([place]) E11.40, E11.65   ??? Pain of left hip joint M25.552   ??? Low back pain M54.5   ???  Smoker F17.200   ??? Dupuytren contracture M72.0   ??? S/P mastectomy, left Z90.12   ??? Cigarette nicotine dependence without complication J19.417   ??? Recurrent depression ([facility]) F33.9   ??? History of breast cancer Z38.40      52 year old woman with ER/PR positive, HER negative T1cN0 left breast  cancer s/p left mastectomy, sentinel node biopsy with reconstruction and revision of right breast per Dr. Ronnald Ramp 06/10/17 as well as a history of right breast cancer in 2008. Continue ovarian suppression and aromatase inhibitor (Lupron and Femara) per Dr. Brent General.  Continue to follow up with Dr. Ronnald Ramp.   Tobacco cessation again recommended.  Follow up 3 months.  Please call sooner with questions or concerns.

## 2018-04-20 NOTE — Patient Instructions (Signed)
If you have any questions or concerns about today's appointment, the verbal and/or written instructions you were given for follow up care, please call our office at [phone].    [facility]  [address], [address]  [place] [address]    [phone] office  [phone]

## 2018-04-20 NOTE — Progress Notes (Signed)
1. Have you been to the ER, urgent care clinic since your last visit?  Hospitalized since your last visit?No    2. Have you seen or consulted any other health care providers outside of the [facility] since your last visit?  Include any pap smears or colon screening. Breast surgery with Dr. Ronnald Ramp.     Patient presents for follow up for left breast cancer.

## 2018-05-01 NOTE — Progress Notes (Signed)
Patient called the office stating she went to [facility] ER on [address], which is where they found she has a nodule on her L upper lobe of her lung. She was told to call and let Dr. Jennye Boroughs know.

## 2018-05-01 NOTE — Progress Notes (Signed)
Patient called the office stating she went to [facility] ER on [address], which is where they found she has a nodule on her L upper lobe of her lung. She was told to call and let Dr. Jennye Boroughs know.

## 2018-05-04 NOTE — Care Coordination-Inpatient (Signed)
F/u call to pt regarding her most recent visit to [facility] ED. Pt states she has spoken with Dr Manya Silvas office about her left upper lobe lung nodule.She already had a f/u ov scheduled with Dr Brent General on 05-18-18. Instructed her to call back Dr Manya Silvas office to see if he wants her to come in for a f/u visit before 05-18-18. All questions answered.

## 2018-05-04 NOTE — Nurse Consult (Signed)
F/u call to pt regarding her most recent visit to [facility] ED. Pt states she has spoken with Dr Manya Silvas office about her left upper lobe lung nodule.She already had a f/u ov scheduled with Dr Brent General on 05-18-18. Instructed her to call back Dr Manya Silvas office to see if he wants her to come in for a f/u visit before 05-18-18. All questions answered.

## 2018-05-25 ENCOUNTER — Inpatient Hospital Stay
Admit: 2018-05-25 | Discharge: 2018-05-26 | Disposition: A | Discharge status: Home / Self Care | Payer: PRIVATE HEALTH INSURANCE | Attending: Emergency Medicine

## 2018-05-25 DIAGNOSIS — K056 Periodontal disease, unspecified: Secondary | ICD-10-CM

## 2018-05-25 NOTE — ED Provider Notes (Signed)
HPI     52 year old female presents with severe "toothache" for the last 2 weeks, exacerbated by eating and partially relieved by extra strength tylenol. Hasn't been able to see a dentist but gets paid this Friday so will try again.     Past Medical History:   Diagnosis Date   ??? Back pain    ??? Breast cancer ([place]) 2008 or 2009   ??? Chronic pain    ??? Diabetes ([place])    ??? FH: chemotherapy    ??? Hypertension    ??? Nausea & vomiting    ??? Neuropathy    ??? Radiation therapy complication 3716    NOT complication, just radiation       Past Surgical History:   Procedure Laterality Date   ??? BIOPSY BREAST     ??? BIOPSY FINE NEEDLE     ??? HX BREAST AUGMENTATION Right 2010   ??? HX BREAST AUGMENTATION Left 09/17/2017    left breast reconstruction with exchange of tissue expander per breast prostesis and left breast tissue expander performed by [name], MD at [facility]   ??? HX BREAST AUGMENTATION Right 04/01/2018    removal of tissue expanders with placment of right permanent breast prostesis performed by [name], MD at [facility]   ??? HX BREAST RECONSTRUCTION Right 2011    per pt - attempted latissimus dorsi flap, then saline implant   ??? HX BREAST RECONSTRUCTION Bilateral 06/10/2017    right breast prosthesis and capsule removal and bilateral breast tissue  expander reconstruction.  performed by [name], MD at [facility]   ??? HX BREAST RECONSTRUCTION Left 07/09/2017    left breast  EXCISION and closure of wound performed by [name], MD at [place]   ??? HX BREAST RECONSTRUCTION Bilateral     Tissue expanders, to prepare for implants    ??? HX CESAREAN SECTION     ??? HX LUMBAR DISKECTOMY      herniated disc   ??? HX MASTECTOMY Right 2010    Total mastectomy per pt.     ??? HX MASTECTOMY Left 06/10/2017    left mastectomy,Sentinal node biopsies. performed by [name], MD at [facility] MAIN OR   ??? HX MASTECTOMY Bilateral    ??? HX MASTOPEXY (BREAST LIFT) Left 2011   ??? IMPLANT BREAST SILICONE/EQ Right 9678     post mastectomy   ??? THUMB SUPPORT      thumb surgery to mobilize thumb         Family History:   Problem Relation Age of Onset   ??? Diabetes Mother    ??? No Known Problems Father        Social History     Socioeconomic History   ??? Marital status: MARRIED     Spouse name: Not on file   ??? Number of children: Not on file   ??? Years of education: Not on file   ??? Highest education level: Not on file   Occupational History   ??? Not on file   Social Needs   ??? Financial resource strain: Not on file   ??? Food insecurity:     Worry: Not on file     Inability: Not on file   ??? Transportation needs:     Medical: Not on file     Non-medical: Not on file   Tobacco Use   ??? Smoking status: Current Every Day Smoker     Packs/day: 0.50   ???  Smokeless tobacco: Never Used   ??? Tobacco comment: 5-6 cigs per day   Substance and Sexual Activity   ??? Alcohol use: No   ??? Drug use: No   ??? Sexual activity: Not on file   Lifestyle   ??? Physical activity:     Days per week: Not on file     Minutes per session: Not on file   ??? Stress: Not on file   Relationships   ??? Social connections:     Talks on phone: Not on file     Gets together: Not on file     Attends religious service: Not on file     Active member of club or organization: Not on file     Attends meetings of clubs or organizations: Not on file     Relationship status: Not on file   ??? Intimate partner violence:     Fear of current or ex partner: Not on file     Emotionally abused: Not on file     Physically abused: Not on file     Forced sexual activity: Not on file   Other Topics Concern   ??? Military Service Not Asked   ??? Blood Transfusions Not Asked   ??? Caffeine Concern Not Asked   ??? Occupational Exposure Not Asked   ??? Hobby Hazards Not Asked   ??? Sleep Concern Not Asked   ??? Stress Concern Not Asked   ??? Weight Concern Not Asked   ??? Special Diet Not Asked   ??? Back Care Not Asked   ??? Exercise Not Asked   ??? Bike Helmet Not Asked   ??? Seat Belt Not Asked   ??? Self-Exams Not Asked   Social History  Narrative   ??? Not on file         ALLERGIES: Tramadol; Metformin; and Trulicity [dulaglutide]    Review of Systems   Constitutional: Negative for chills and fever.   HENT: Positive for dental problem. Negative for ear pain and sore throat.    Eyes: Negative for pain and visual disturbance.   Respiratory: Negative for cough and shortness of breath.    Cardiovascular: Negative for chest pain and palpitations.   Gastrointestinal: Negative for abdominal pain, diarrhea, nausea and vomiting.   Genitourinary: Negative for flank pain.   Musculoskeletal: Negative for back pain and neck pain.   Neurological: Negative for syncope and headaches.   Psychiatric/Behavioral: Negative for agitation. The patient is not nervous/anxious.        Vitals:    05/25/18 1935   BP: (!) 165/103   Pulse: 86   Resp: 18   Temp: 98.4 ??F (36.9 ??C)   SpO2: 98%   Weight: 60.3 kg (133 lb)   Height: 5\' 2"  (1.575 m)            Physical Exam   Constitutional: She is oriented to person, place, and time. She appears well-developed and well-nourished.   HENT:   Head: Normocephalic and atraumatic.   Mouth/Throat: Oropharynx is clear and moist. No oropharyngeal exudate.   Extensive periodontal disease and dental decay with erythematous and swollen gingiva of the right upper molars and tooth 27. No submandibular and sublingual edema. Patent airway. No abscess.    Eyes: Pupils are equal, round, and reactive to light. EOM are normal. No scleral icterus.   Neck: Neck supple. No tracheal deviation present.   Cardiovascular: Normal rate, regular rhythm and intact distal pulses.   No murmur heard.  Pulmonary/Chest:  Effort normal and breath sounds normal. No respiratory distress.   Abdominal: Soft. There is no tenderness.   Musculoskeletal: Normal range of motion. She exhibits no deformity.   Neurological: She is alert and oriented to person, place, and time. No cranial nerve deficit.   No gross neuro deficit   Skin: Skin is warm and dry. No rash noted. She is not  diaphoretic.   Psychiatric: She has a normal mood and affect.   Nursing note and vitals reviewed.     Medications   naproxen (NAPROSYN) tablet 500 mg (has no administration in time range)   acetaminophen (TYLENOL) tablet 1,000 mg (has no administration in time range)   penicillin v potassium (VEETID) tablet 500 mg (has no administration in time range)       MDM     Chronic, severe periodontal disease with acutely inflamed gingiva and no palpable abscesses pending dental follow-up.  Will prescribe course of penicillin.  Patient reports normal kidney function which was recently checked.  Will add naproxen to her Tylenol pain medication regimen.  Given list of low-cost dental clinics.  Not concerned for Ludwig angina or facial cellulitis.    Patient has no new complaints, changes, or physical findings.   Diagnostic studies if preformed were reviewed with the patient and/or family.  All questions and concerns were addressed. Care plan was outlined, including follow-up with PCP/specialist and return precautions were discussed. Patient is felt to be stable for discharge at this time.       ICD-10-CM ICD-9-CM    1. Periodontal disease K05.6 523.9    2. Pain, dental K08.89 525.9    3. Tooth decay K02.9 521.00      Dispo: Home       Procedures

## 2018-05-25 NOTE — ED Notes (Signed)
I have reviewed discharge instructions with the patient.  The patient verbalized understanding.Patient armband removed and shredded. 3 prescriptions given. Pt ambulated out of the ED.

## 2018-05-25 NOTE — ED Notes (Signed)
Alert and ambulatory female arrived through triage c/o right upper and lower dental pain. Pt stated pt unable to find dental clinics that accept pt insurance. Pt took Tylenol around 3 pm today without much relief.

## 2018-05-25 NOTE — ED Triage Notes (Signed)
Alert and ambulatory female arrived through triage c/o right upper and lower dental pain. Pt stated pt unable to find dental clinics that accept pt insurance. Pt took Tylenol around 3 pm today without much relief.

## 2018-05-25 NOTE — ED Provider Notes (Addendum)
HPI     52 year old female presents with severe "toothache" for the last 2 weeks, exacerbated by eating and partially relieved by extra strength tylenol. Hasn't been able to see a dentist but gets paid this Friday so will try again.     Past Medical History:   Diagnosis Date   ??? Back pain    ??? Breast cancer ([place]) 2008 or 2009   ??? Chronic pain    ??? Diabetes ([place])    ??? FH: chemotherapy    ??? Hypertension    ??? Nausea & vomiting    ??? Neuropathy    ??? Radiation therapy complication 7673    NOT complication, just radiation       Past Surgical History:   Procedure Laterality Date   ??? BIOPSY BREAST     ??? BIOPSY FINE NEEDLE     ??? HX BREAST AUGMENTATION Right 2010   ??? HX BREAST AUGMENTATION Left 09/17/2017    left breast reconstruction with exchange of tissue expander per breast prostesis and left breast tissue expander performed by [name], MD at [facility]   ??? HX BREAST AUGMENTATION Right 04/01/2018    removal of tissue expanders with placment of right permanent breast prostesis performed by [name], MD at [place]   ??? HX BREAST RECONSTRUCTION Right 2011    per pt - attempted latissimus dorsi flap, then saline implant   ??? HX BREAST RECONSTRUCTION Bilateral 06/10/2017    right breast prosthesis and capsule removal and bilateral breast tissue  expander reconstruction.  performed by [name], MD at [place]   ??? HX BREAST RECONSTRUCTION Left 07/09/2017    left breast  EXCISION and closure of wound performed by [name], MD at [place]   ??? HX BREAST RECONSTRUCTION Bilateral     Tissue expanders, to prepare for implants    ??? HX CESAREAN SECTION     ??? HX LUMBAR DISKECTOMY      herniated disc   ??? HX MASTECTOMY Right 2010    Total mastectomy per pt.     ??? HX MASTECTOMY Left 06/10/2017    left mastectomy,Sentinal node biopsies. performed by [name], MD [facility] MA[facility]   ??? HX MASTECTOMY Bilateral    ??? HX MASTOPEXY (BREAST LIFT) Left 2011   ??? IMPLANT BREAST SILICONE/EQ Right 4193     post mastectomy   ??? THUMB SUPPORT      thumb surgery to mobilize thumb         Family History:   Problem Relation Age of Onset   ??? Diabetes Mother    ??? No Known Problems Father        Social History     Socioeconomic History   ??? Marital status: MARRIED     Spouse name: Not on file   ??? Number of children: Not on file   ??? Years of education: Not on file   ??? Highest education level: Not on file   Occupational History   ??? Not on file   Social Needs   ??? Financial resource strain: Not on file   ??? Food insecurity:     Worry: Not on file     Inability: Not on file   ??? Transportation needs:     Medical: Not on file     Non-medical: Not on file   Tobacco Use   ??? Smoking status: Current Every Day Smoker     Packs/day: 0.50   ???  Smokeless tobacco: Never Used   ??? Tobacco comment: 5-6 cigs per day   Substance and Sexual Activity   ??? Alcohol use: No   ??? Drug use: No   ??? Sexual activity: Not on file   Lifestyle   ??? Physical activity:     Days per week: Not on file     Minutes per session: Not on file   ??? Stress: Not on file   Relationships   ??? Social connections:     Talks on phone: Not on file     Gets together: Not on file     Attends religious service: Not on file     Active member of club or organization: Not on file     Attends meetings of clubs or organizations: Not on file     Relationship status: Not on file   ??? Intimate partner violence:     Fear of current or ex partner: Not on file     Emotionally abused: Not on file     Physically abused: Not on file     Forced sexual activity: Not on file   Other Topics Concern   ??? Military Service Not Asked   ??? Blood Transfusions Not Asked   ??? Caffeine Concern Not Asked   ??? Occupational Exposure Not Asked   ??? Hobby Hazards Not Asked   ??? Sleep Concern Not Asked   ??? Stress Concern Not Asked   ??? Weight Concern Not Asked   ??? Special Diet Not Asked   ??? Back Care Not Asked   ??? Exercise Not Asked   ??? Bike Helmet Not Asked   ??? Seat Belt Not Asked   ??? Self-Exams Not Asked    Social History Narrative   ??? Not on file         ALLERGIES: Tramadol; Metformin; and Trulicity [dulaglutide]    Review of Systems   Constitutional: Negative for chills and fever.   HENT: Positive for dental problem. Negative for ear pain and sore throat.    Eyes: Negative for pain and visual disturbance.   Respiratory: Negative for cough and shortness of breath.    Cardiovascular: Negative for chest pain and palpitations.   Gastrointestinal: Negative for abdominal pain, diarrhea, nausea and vomiting.   Genitourinary: Negative for flank pain.   Musculoskeletal: Negative for back pain and neck pain.   Neurological: Negative for syncope and headaches.   Psychiatric/Behavioral: Negative for agitation. The patient is not nervous/anxious.        Vitals:    05/25/18 1935   BP: (!) 165/103   Pulse: 86   Resp: 18   Temp: 98.4 ??F (36.9 ??C)   SpO2: 98%   Weight: 60.3 kg (133 lb)   Height: 5\' 2"  (1.575 m)            Physical Exam   Constitutional: She is oriented to person, place, and time. She appears well-developed and well-nourished.   HENT:   Head: Normocephalic and atraumatic.   Mouth/Throat: Oropharynx is clear and moist. No oropharyngeal exudate.   Extensive periodontal disease and dental decay with erythematous and swollen gingiva of the right upper molars and tooth 27. No submandibular and sublingual edema. Patent airway. No abscess.    Eyes: Pupils are equal, round, and reactive to light. EOM are normal. No scleral icterus.   Neck: Neck supple. No tracheal deviation present.   Cardiovascular: Normal rate, regular rhythm and intact distal pulses.   No murmur heard.  Pulmonary/Chest:  Effort normal and breath sounds normal. No respiratory distress.   Abdominal: Soft. There is no tenderness.   Musculoskeletal: Normal range of motion. She exhibits no deformity.   Neurological: She is alert and oriented to person, place, and time. No cranial nerve deficit.   No gross neuro deficit    Skin: Skin is warm and dry. No rash noted. She is not diaphoretic.   Psychiatric: She has a normal mood and affect.   Nursing note and vitals reviewed.     Medications   naproxen (NAPROSYN) tablet 500 mg (has no administration in time range)   acetaminophen (TYLENOL) tablet 1,000 mg (has no administration in time range)   penicillin v potassium (VEETID) tablet 500 mg (has no administration in time range)       MDM     Chronic, severe periodontal disease with acutely inflamed gingiva and no palpable abscesses pending dental follow-up.  Will prescribe course of penicillin.  Patient reports normal kidney function which was recently checked.  Will add naproxen to her Tylenol pain medication regimen.  Given list of low-cost dental clinics.  Not concerned for Ludwig angina or facial cellulitis.    Patient has no new complaints, changes, or physical findings.   Diagnostic studies if preformed were reviewed with the patient and/or family.  All questions and concerns were addressed. Care plan was outlined, including follow-up with PCP/specialist and return precautions were discussed. Patient is felt to be stable for discharge at this time.       ICD-10-CM ICD-9-CM    1. Periodontal disease K05.6 523.9    2. Pain, dental K08.89 525.9    3. Tooth decay K02.9 521.00      Dispo: Home       Procedures

## 2018-05-26 MED ORDER — NAPROXEN 250 MG TAB
250 mg | Freq: Once | ORAL | Status: AC
Start: 2018-05-26 — End: 2018-05-25
  Administered 2018-05-26: via ORAL

## 2018-05-26 MED ORDER — PENICILLIN V-K 500 MG TAB
500 mg | ORAL_TABLET | Freq: Two times a day (BID) | ORAL | 0 refills | Status: AC
Start: 2018-05-26 — End: 2018-06-04

## 2018-05-26 MED ORDER — ACETAMINOPHEN 500 MG TAB
500 mg | ORAL_TABLET | Freq: Three times a day (TID) | ORAL | 0 refills | Status: AC | PRN
Start: 2018-05-26 — End: 2018-06-04

## 2018-05-26 MED ORDER — ACETAMINOPHEN 500 MG TAB
500 mg | ORAL | Status: AC
Start: 2018-05-26 — End: 2018-05-25
  Administered 2018-05-26: via ORAL

## 2018-05-26 MED ORDER — NAPROXEN 500 MG TAB
500 mg | ORAL_TABLET | Freq: Two times a day (BID) | ORAL | 0 refills | Status: AC
Start: 2018-05-26 — End: 2018-06-04

## 2018-05-26 MED ORDER — PENICILLIN V-K 250 MG TAB
250 mg | ORAL | Status: AC
Start: 2018-05-26 — End: 2018-05-25
  Administered 2018-05-26: via ORAL

## 2018-05-26 MED FILL — PENICILLIN V-K 250 MG TAB: 250 mg | ORAL | Qty: 2

## 2018-05-26 MED FILL — MAPAP EXTRA STRENGTH 500 MG TABLET: 500 mg | ORAL | Qty: 2

## 2018-05-26 MED FILL — NAPROXEN 250 MG TAB: 250 mg | ORAL | Qty: 2

## 2018-05-27 ENCOUNTER — Encounter: Attending: Family Medicine | Primary: Nurse Practitioner

## 2018-06-22 ENCOUNTER — Ambulatory Visit: Attending: Sports Medicine | Primary: Nurse Practitioner

## 2018-06-22 ENCOUNTER — Ambulatory Visit
Admit: 2018-06-22 | Discharge: 2018-06-22 | Payer: PRIVATE HEALTH INSURANCE | Attending: Sports Medicine | Primary: Nurse Practitioner

## 2018-06-22 DIAGNOSIS — M5416 Radiculopathy, lumbar region: Secondary | ICD-10-CM

## 2018-06-22 MED ORDER — VENLAFAXINE SR 75 MG 24 HR CAP
75 mg | ORAL_CAPSULE | Freq: Every day | ORAL | 3 refills | Status: DC
Start: 2018-06-22 — End: 2018-12-24

## 2018-06-22 MED ORDER — GABAPENTIN 400 MG CAP
400 mg | ORAL_CAPSULE | ORAL | 3 refills | Status: DC
Start: 2018-06-22 — End: 2018-10-23

## 2018-06-22 MED ORDER — GABAPENTIN 300 MG CAP
300 mg | ORAL_CAPSULE | Freq: Three times a day (TID) | ORAL | 3 refills | Status: DC
Start: 2018-06-22 — End: 2018-06-22

## 2018-06-22 MED ORDER — GABAPENTIN 600 MG TAB
600 mg | ORAL_TABLET | Freq: Three times a day (TID) | ORAL | 2 refills | Status: DC
Start: 2018-06-22 — End: 2018-06-22

## 2018-06-22 NOTE — Progress Notes (Signed)
Impression / Plan     Diagnoses and all orders for this visit:    1. Lumbar radiculopathy, acute  -     MRI LUMB SPINE W CONT; Future  -     gabapentin (NEURONTIN) 400 mg capsule; 800 mg PO TID    2. Diabetic polyneuropathy associated with type 2 diabetes mellitus (HCC)  -     REFERRAL TO PODIATRY    3. Hot flashes due to menopause  -     venlafaxine-SR [facility]) 75 mg capsule; Take 2 Caps by mouth daily.    Plan: Post Laminectomy Syndrome, Lumbar Radiculopathy, will move forward with MRI of the Lumbar Spine with Contrast, consider consult with Pain Clinic for [facility] or Nerve Block. Titrate Gabapentin 800 mg TID in the meantime. Will start Trial of Effexor for Depression which may help PMS and Follow Up in 6 weeks to Monitor, continue with [facility] for CBT.     Follow-up and Dispositions    ?? Return in about 6 weeks (around 08/03/2018).       HPI   Barbara [name] is a 52 y.o.female presenting for Lumbago, Depression. Regarding Lumbago, reports onset of symptoms for many years, S/P L4-L5 Laminectomy. Denies specific injury or trauma. No bowel or bladder dysfunction. No FEVER or chills. Positive motor dysfunction, RIGHT > LEFT. CT Myelogram 10/27/2017 shows evidence of L5 radiculopathy. No MRI at the time secondary to Tissue Expanders from Breast CA. Mrs. Tullos reports numerous Trials of PO medications and PT without much help. Also, currently on CBT with psychology and requesting medication for Depression. No SI or HI.     Medications     Outpatient Medications Prior to Visit   Medication Sig Dispense Refill   ??? cephALEXin (KEFLEX) 250 mg capsule Take 2 Caps by mouth three (3) times daily. 30 Cap 2   ??? cephALEXin (KEFLEX) 250 mg capsule Take 2 Caps by mouth three (3) times daily. 30 Cap 2   ??? ondansetron (ZOFRAN ODT) 8 mg disintegrating tablet Take 1 Tab by mouth every eight (8) hours as needed for Nausea. 10 Tab 0   ??? sAXagliptin (ONGLYZA) 5 mg tab tablet Take 1 Tab by mouth daily. 90 Tab 1   ??? insulin  NPH/insulin regular (NOVOLIN 70/30, HUMULIN 70/30) 100 unit/mL (70-30) injection 35 Units Daily (before breakfast) AND 30 Units Daily (before dinner). 20 mL 3   ??? ramipril (ALTACE) 10 mg capsule Take 1 Cap by mouth daily. 90 Cap 3   ??? atorvastatin (LIPITOR) 10 mg tablet Take 1 Tab by mouth daily. 90 Tab 1   ??? nystatin (MYCOSTATIN) powder Apply to rash 4 times a day 60 g 3   ??? ketoconazole (NIZORAL) 2 % topical cream Apply  to affected area two (2) times a day. 60 g 3   ??? letrozole (FEMARA) 2.5 mg tablet Take 2.5 mg by mouth daily.     ??? gabapentin (NEURONTIN) 600 mg tablet Take 1 Tab by mouth three (3) times daily. 270 Tab 2   ??? venlafaxine-SR (EFFEXOR-XR) 75 mg capsule Take 1 Cap by mouth daily. 90 Cap 1     No facility-administered medications prior to visit.         Allergies     Allergies   Allergen Reactions   ??? Tramadol Anaphylaxis   ??? Metformin Diarrhea   ??? Trulicity [Dulaglutide] Nausea Only       Problem List     Patient Active Problem List    Diagnosis  Date Noted   ??? Recurrent depression ([place]) 02/23/2018   ??? History of breast cancer 02/23/2018   ??? Cigarette nicotine dependence without complication 47/82/9562   ??? S/P mastectomy, left 06/11/2017   ??? Smoker 05/22/2017   ??? Dupuytren contracture 05/22/2017   ??? Low back pain 03/04/2017   ??? Essential hypertension 01/29/2017   ??? DM type 2, uncontrolled, with neuropathy ([place]) 01/29/2017   ??? Pain of left hip joint 01/29/2017        Medical / Surgical / Family History     Past Medical History:   Diagnosis Date   ??? Back pain    ??? Breast cancer ([place]) 2008 or 2009   ??? Chronic pain    ??? Diabetes ([place])    ??? FH: chemotherapy    ??? Hypertension    ??? Nausea & vomiting    ??? Neuropathy    ??? Radiation therapy complication 1308    NOT complication, just radiation     Past Surgical History:   Procedure Laterality Date   ??? BIOPSY BREAST     ??? BIOPSY FINE NEEDLE     ??? HX BREAST AUGMENTATION Right 2010   ??? HX BREAST AUGMENTATION Left 09/17/2017    left breast reconstruction with  exchange of tissue expander per breast prostesis and left breast tissue expander performed by [name], MD at [place]   ??? HX BREAST AUGMENTATION Right 04/01/2018    removal of tissue expanders with placment of right permanent breast prostesis performed by [name], MD at [place]   ??? HX BREAST RECONSTRUCTION Right 2011    per pt - attempted latissimus dorsi flap, then saline implant   ??? HX BREAST RECONSTRUCTION Bilateral 06/10/2017    right breast prosthesis and capsule removal and bilateral breast tissue  expander reconstruction.  performed by [name], MD at [facility]   ??? HX BREAST RECONSTRUCTION Left 07/09/2017    left breast  EXCISION and closure of wound performed by [name], MD at [place]   ??? HX BREAST RECONSTRUCTION Bilateral     Tissue expanders, to prepare for implants    ??? HX CESAREAN SECTION     ??? HX LUMBAR DISKECTOMY      herniated disc   ??? HX MASTECTOMY Right 2010    Total mastectomy per pt.     ??? HX MASTECTOMY Left 06/10/2017    left mastectomy,Sentinal node biopsies. performed by [name], MD at [facility] MAIN OR   ??? HX MASTECTOMY Bilateral    ??? HX MASTOPEXY (BREAST LIFT) Left 2011   ??? IMPLANT BREAST SILICONE/EQ Right 6578    post mastectomy   ??? THUMB SUPPORT      thumb surgery to mobilize thumb     Family History   Problem Relation Age of Onset   ??? Diabetes Mother    ??? No Known Problems Father        Social History     Social History     Socioeconomic History   ??? Marital status: MARRIED     Spouse name: Not on file   ??? Number of children: Not on file   ??? Years of education: Not on file   ??? Highest education level: Not on file   Occupational History   ??? Not on file   Social Needs   ??? Financial resource strain: Not on file   ??? Food insecurity:     Worry: Not on file  Inability: Not on file   ??? Transportation needs:     Medical: Not on file     Non-medical: Not on file   Tobacco Use   ??? Smoking status: Current Every Day Smoker     Packs/day: 0.50    ??? Smokeless tobacco: Never Used   ??? Tobacco comment: 5-6 cigs per day   Substance and Sexual Activity   ??? Alcohol use: No   ??? Drug use: No   ??? Sexual activity: Not on file   Lifestyle   ??? Physical activity:     Days per week: Not on file     Minutes per session: Not on file   ??? Stress: Not on file   Relationships   ??? Social connections:     Talks on phone: Not on file     Gets together: Not on file     Attends religious service: Not on file     Active member of club or organization: Not on file     Attends meetings of clubs or organizations: Not on file     Relationship status: Not on file   ??? Intimate partner violence:     Fear of current or ex partner: Not on file     Emotionally abused: Not on file     Physically abused: Not on file     Forced sexual activity: Not on file   Other Topics Concern   ??? Military Service Not Asked   ??? Blood Transfusions Not Asked   ??? Caffeine Concern Not Asked   ??? Occupational Exposure Not Asked   ??? Hobby Hazards Not Asked   ??? Sleep Concern Not Asked   ??? Stress Concern Not Asked   ??? Weight Concern Not Asked   ??? Special Diet Not Asked   ??? Back Care Not Asked   ??? Exercise Not Asked   ??? Bike Helmet Not Asked   ??? Seat Belt Not Asked   ??? Self-Exams Not Asked   Social History Narrative   ??? Not on file     ROS   Review of Systems    10 Element ROS negative unless specifically stated in History of Present Illness.     Health Maintenance     Health Maintenance   Topic Date Due   ??? FOOT EXAM Q1  11/27/1977   ??? PAP AKA CERVICAL CYTOLOGY  11/27/1988   ??? Shingrix Vaccine Age 21> (1 of 2) 11/27/2017   ??? FOBT Q 1 YEAR AGE 68-75  11/27/2017   ??? MICROALBUMIN Q1  01/29/2018   ??? Influenza Age 64 to Adult  05/14/2018   ??? LIPID PANEL Q1  05/15/2018   ??? HEMOGLOBIN A1C Q6M  08/26/2018   ??? EYE EXAM RETINAL OR DILATED  03/27/2019   ??? BREAST CANCER SCRN MAMMOGRAM  05/14/2019   ??? DTaP/Tdap/Td series (2 - Td) 02/04/2024   ??? Bone Densitometry (Dexa) Screening  11/27/2032   ??? Pneumococcal 0-64 years  Completed      Physical Exam   BP 132/81 (BP 1 Location: Left arm, BP Patient Position: Sitting)    Pulse (!) 104    Temp 97.4 ??F (36.3 ??C) (Oral)    Resp 19    Ht 5\' 2"  (1.575 m)    Wt 134 lb (60.8 kg)    SpO2 94%    BMI 24.51 kg/m??     Physical Exam   Constitutional: She is oriented to person, place, and time. She appears well-developed and  well-nourished. She appears distressed.   Mild to Moderate Pain Distress   HENT:   Head: Normocephalic and atraumatic.   Eyes: Pupils are equal, round, and reactive to light. Conjunctivae and EOM are normal.   Cardiovascular: Normal rate, regular rhythm and normal heart sounds.   No murmur heard.  Pulmonary/Chest: Effort normal and breath sounds normal. No respiratory distress. She has no wheezes.   Abdominal: Soft. Bowel sounds are normal. She exhibits no distension. There is no tenderness.   Neurological: She is alert and oriented to person, place, and time. No cranial nerve deficit. Coordination abnormal.   Antalgic Gait.    Skin: Skin is warm and dry. No rash noted. No erythema.   Psychiatric: She has a normal mood and affect. Her behavior is normal.     Back Exam     Tenderness   The patient is experiencing tenderness in the lumbar.    Range of Motion   Extension: abnormal   Flexion: normal   Lateral bend right: abnormal   Lateral bend left: normal   Rotation right: abnormal   Rotation left: normal     Muscle Strength   Right Quadriceps:  5/5   Left Quadriceps:  5/5   Right Hamstrings:  4/5   Left Hamstrings:  5/5     Reflexes   Patellar: normal  Achilles: normal    Other   Toe walk: normal  Heel walk: abnormal  Sensation: normal  Gait: antalgic   Erythema: no back redness  Scars: absent    Comments:  Equivocal RIGHT Leg Raise.             [name], DO

## 2018-06-22 NOTE — Progress Notes (Signed)
 Room #      SUBJECTIVE:    Barbara [name] is a 52 y.o. female who presents today for ED follow up for back pain and referral for foot doctor    1. Have you been to the ER, urgent care clinic since your last visit?  Hospitalized since your last visit?Yes    2. Have you seen or consulted any other health care providers outside of the [facility] since your last visit?  Include any pap smears or colon screening.NO  When :  Reason:    Health Maintenance reviewed Yes    Health Maintenance Due   Topic Date Due   . FOOT EXAM Q1  11/27/1977   . PAP AKA CERVICAL CYTOLOGY  11/27/1988   . Shingrix Vaccine Age 76> (1 of 2) 11/27/2017   . FOBT Q 1 YEAR AGE 38-75  11/27/2017   . MICROALBUMIN Q1  01/29/2018   . Influenza Age 75 to Adult  05/14/2018   . LIPID PANEL Q1  05/15/2018

## 2018-06-22 NOTE — Telephone Encounter (Signed)
Patient was wanting to know if she could switch over to Dr. Tamala Julian from Dr. Lissa Hoard. She stated that he left a very good impression and she really liked him. Please advise.

## 2018-06-22 NOTE — Progress Notes (Signed)
Room #      SUBJECTIVE:    Barbara [name] is a 52 y.o. female who presents today for ED follow up for back pain and referral for foot doctor    1. Have you been to the ER, urgent care clinic since your last visit?  Hospitalized since your last visit?Yes    2. Have you seen or consulted any other health care providers outside of the [place] since your last visit?  Include any pap smears or colon screening.NO  When :  Reason:    Health Maintenance reviewed Yes    Health Maintenance Due   Topic Date Due   ??? FOOT EXAM Q1  11/27/1977   ??? PAP AKA CERVICAL CYTOLOGY  11/27/1988   ??? Shingrix Vaccine Age 53> (1 of 2) 11/27/2017   ??? FOBT Q 1 YEAR AGE 72-75  11/27/2017   ??? MICROALBUMIN Q1  01/29/2018   ??? Influenza Age 26 to Adult  05/14/2018   ??? LIPID PANEL Q1  05/15/2018

## 2018-06-22 NOTE — Progress Notes (Signed)
Impression / Plan     Diagnoses and all orders for this visit:    1. Lumbar radiculopathy, acute  -     MRI LUMB SPINE W CONT; Future  -     gabapentin (NEURONTIN) 400 mg capsule; 800 mg PO TID    2. Diabetic polyneuropathy associated with type 2 diabetes mellitus (HCC)  -     REFERRAL TO PODIATRY    3. Hot flashes due to menopause  -     venlafaxine-SR [facility]) 75 mg capsule; Take 2 Caps by mouth daily.    Plan: Post Laminectomy Syndrome, Lumbar Radiculopathy, will move forward with MRI of the Lumbar Spine with Contrast, consider consult with Pain Clinic for [facility] or Nerve Block. Titrate Gabapentin 800 mg TID in the meantime. Will start Trial of Effexor for Depression which may help PMS and Follow Up in 6 weeks to Monitor, continue with [facility] for CBT.     Follow-up and Dispositions    ?? Return in about 6 weeks (around 08/03/2018).       HPI   Barbara [name] is a 52 y.o.female presenting for Lumbago, Depression. Regarding Lumbago, reports onset of symptoms for many years, S/P L4-L5 Laminectomy. Denies specific injury or trauma. No bowel or bladder dysfunction. No FEVER or chills. Positive motor dysfunction, RIGHT > LEFT. CT Myelogram 10/27/2017 shows evidence of L5 radiculopathy. No MRI at the time secondary to Tissue Expanders from Breast CA. Barbara [name] reports numerous Trials of PO medications and PT without much help. Also, currently on CBT with psychology and requesting medication for Depression. No SI or HI.     Medications     Outpatient Medications Prior to Visit   Medication Sig Dispense Refill   ??? cephALEXin (KEFLEX) 250 mg capsule Take 2 Caps by mouth three (3) times daily. 30 Cap 2   ??? cephALEXin (KEFLEX) 250 mg capsule Take 2 Caps by mouth three (3) times daily. 30 Cap 2   ??? ondansetron (ZOFRAN ODT) 8 mg disintegrating tablet Take 1 Tab by mouth every eight (8) hours as needed for Nausea. 10 Tab 0   ??? sAXagliptin (ONGLYZA) 5 mg tab tablet Take 1 Tab by mouth daily. 90 Tab 1    ??? insulin NPH/insulin regular (NOVOLIN 70/30, HUMULIN 70/30) 100 unit/mL (70-30) injection 35 Units Daily (before breakfast) AND 30 Units Daily (before dinner). 20 mL 3   ??? ramipril (ALTACE) 10 mg capsule Take 1 Cap by mouth daily. 90 Cap 3   ??? atorvastatin (LIPITOR) 10 mg tablet Take 1 Tab by mouth daily. 90 Tab 1   ??? nystatin (MYCOSTATIN) powder Apply to rash 4 times a day 60 g 3   ??? ketoconazole (NIZORAL) 2 % topical cream Apply  to affected area two (2) times a day. 60 g 3   ??? letrozole (FEMARA) 2.5 mg tablet Take 2.5 mg by mouth daily.     ??? gabapentin (NEURONTIN) 600 mg tablet Take 1 Tab by mouth three (3) times daily. 270 Tab 2   ??? venlafaxine-SR (EFFEXOR-XR) 75 mg capsule Take 1 Cap by mouth daily. 90 Cap 1     No facility-administered medications prior to visit.         Allergies     Allergies   Allergen Reactions   ??? Tramadol Anaphylaxis   ??? Metformin Diarrhea   ??? Trulicity [Dulaglutide] Nausea Only       Problem List     Patient Active Problem List    Diagnosis  Date Noted   ??? Recurrent depression ([place]) 02/23/2018   ??? History of breast cancer 02/23/2018   ??? Cigarette nicotine dependence without complication 78/46/9629   ??? S/P mastectomy, left 06/11/2017   ??? Smoker 05/22/2017   ??? Dupuytren contracture 05/22/2017   ??? Low back pain 03/04/2017   ??? Essential hypertension 01/29/2017   ??? DM type 2, uncontrolled, with neuropathy ([place]) 01/29/2017   ??? Pain of left hip joint 01/29/2017        Medical / Surgical / Family History     Past Medical History:   Diagnosis Date   ??? Back pain    ??? Breast cancer ([place]) 2008 or 2009   ??? Chronic pain    ??? Diabetes ([place])    ??? FH: chemotherapy    ??? Hypertension    ??? Nausea & vomiting    ??? Neuropathy    ??? Radiation therapy complication 5284    NOT complication, just radiation     Past Surgical History:   Procedure Laterality Date   ??? BIOPSY BREAST     ??? BIOPSY FINE NEEDLE     ??? HX BREAST AUGMENTATION Right 2010   ??? HX BREAST AUGMENTATION Left 09/17/2017     left breast reconstruction with exchange of tissue expander per breast prostesis and left breast tissue expander performed by [name], MD at [place]   ??? HX BREAST AUGMENTATION Right 04/01/2018    removal of tissue expanders with placment of right permanent breast prostesis performed by [name], MD at [place]   ??? HX BREAST RECONSTRUCTION Right 2011    per pt - attempted latissimus dorsi flap, then saline implant   ??? HX BREAST RECONSTRUCTION Bilateral 06/10/2017    right breast prosthesis and capsule removal and bilateral breast tissue  expander reconstruction.  performed by [name], MD at [place]   ??? HX BREAST RECONSTRUCTION Left 07/09/2017    left breast  EXCISION and closure of wound performed by [name], MD at [place]   ??? HX BREAST RECONSTRUCTION Bilateral     Tissue expanders, to prepare for implants    ??? HX CESAREAN SECTION     ??? HX LUMBAR DISKECTOMY      herniated disc   ??? HX MASTECTOMY Right 2010    Total mastectomy per pt.     ??? HX MASTECTOMY Left 06/10/2017    left mastectomy,Sentinal node biopsies. performed by [name], MD at [facility] MAIN OR   ??? HX MASTECTOMY Bilateral    ??? HX MASTOPEXY (BREAST LIFT) Left 2011   ??? IMPLANT BREAST SILICONE/EQ Right 1324    post mastectomy   ??? THUMB SUPPORT      thumb surgery to mobilize thumb     Family History   Problem Relation Age of Onset   ??? Diabetes Mother    ??? No Known Problems Father        Social History     Social History     Socioeconomic History   ??? Marital status: MARRIED     Spouse name: Not on file   ??? Number of children: Not on file   ??? Years of education: Not on file   ??? Highest education level: Not on file   Occupational History   ??? Not on file   Social Needs   ??? Financial resource strain: Not on file   ??? Food insecurity:     Worry: Not on file  Inability: Not on file   ??? Transportation needs:     Medical: Not on file     Non-medical: Not on file   Tobacco Use    ??? Smoking status: Current Every Day Smoker     Packs/day: 0.50   ??? Smokeless tobacco: Never Used   ??? Tobacco comment: 5-6 cigs per day   Substance and Sexual Activity   ??? Alcohol use: No   ??? Drug use: No   ??? Sexual activity: Not on file   Lifestyle   ??? Physical activity:     Days per week: Not on file     Minutes per session: Not on file   ??? Stress: Not on file   Relationships   ??? Social connections:     Talks on phone: Not on file     Gets together: Not on file     Attends religious service: Not on file     Active member of club or organization: Not on file     Attends meetings of clubs or organizations: Not on file     Relationship status: Not on file   ??? Intimate partner violence:     Fear of current or ex partner: Not on file     Emotionally abused: Not on file     Physically abused: Not on file     Forced sexual activity: Not on file   Other Topics Concern   ??? Military Service Not Asked   ??? Blood Transfusions Not Asked   ??? Caffeine Concern Not Asked   ??? Occupational Exposure Not Asked   ??? Hobby Hazards Not Asked   ??? Sleep Concern Not Asked   ??? Stress Concern Not Asked   ??? Weight Concern Not Asked   ??? Special Diet Not Asked   ??? Back Care Not Asked   ??? Exercise Not Asked   ??? Bike Helmet Not Asked   ??? Seat Belt Not Asked   ??? Self-Exams Not Asked   Social History Narrative   ??? Not on file     ROS   Review of Systems    10 Element ROS negative unless specifically stated in History of Present Illness.     Health Maintenance     Health Maintenance   Topic Date Due   ??? FOOT EXAM Q1  11/27/1977   ??? PAP AKA CERVICAL CYTOLOGY  11/27/1988   ??? Shingrix Vaccine Age 28> (1 of 2) 11/27/2017   ??? FOBT Q 1 YEAR AGE 68-75  11/27/2017   ??? MICROALBUMIN Q1  01/29/2018   ??? Influenza Age 39 to Adult  05/14/2018   ??? LIPID PANEL Q1  05/15/2018   ??? HEMOGLOBIN A1C Q6M  08/26/2018   ??? EYE EXAM RETINAL OR DILATED  03/27/2019   ??? BREAST CANCER SCRN MAMMOGRAM  05/14/2019   ??? DTaP/Tdap/Td series (2 - Td) 02/04/2024    ??? Bone Densitometry (Dexa) Screening  11/27/2032   ??? Pneumococcal 0-64 years  Completed     Physical Exam   BP 132/81 (BP 1 Location: Left arm, BP Patient Position: Sitting)    Pulse (!) 104    Temp 97.4 ??F (36.3 ??C) (Oral)    Resp 19    Ht 5\' 2"  (1.575 m)    Wt 134 lb (60.8 kg)    SpO2 94%    BMI 24.51 kg/m??     Physical Exam   Constitutional: She is oriented to person, place, and time. She appears well-developed and  well-nourished. She appears distressed.   Mild to Moderate Pain Distress   HENT:   Head: Normocephalic and atraumatic.   Eyes: Pupils are equal, round, and reactive to light. Conjunctivae and EOM are normal.   Cardiovascular: Normal rate, regular rhythm and normal heart sounds.   No murmur heard.  Pulmonary/Chest: Effort normal and breath sounds normal. No respiratory distress. She has no wheezes.   Abdominal: Soft. Bowel sounds are normal. She exhibits no distension. There is no tenderness.   Neurological: She is alert and oriented to person, place, and time. No cranial nerve deficit. Coordination abnormal.   Antalgic Gait.    Skin: Skin is warm and dry. No rash noted. No erythema.   Psychiatric: She has a normal mood and affect. Her behavior is normal.     Back Exam     Tenderness   The patient is experiencing tenderness in the lumbar.    Range of Motion   Extension: abnormal   Flexion: normal   Lateral bend right: abnormal   Lateral bend left: normal   Rotation right: abnormal   Rotation left: normal     Muscle Strength   Right Quadriceps:  5/5   Left Quadriceps:  5/5   Right Hamstrings:  4/5   Left Hamstrings:  5/5     Reflexes   Patellar: normal  Achilles: normal    Other   Toe walk: normal  Heel walk: abnormal  Sensation: normal  Gait: antalgic   Erythema: no back redness  Scars: absent    Comments:  Equivocal RIGHT Leg Raise.             [name], DO

## 2018-06-23 ENCOUNTER — Encounter: Attending: Sports Medicine | Primary: Nurse Practitioner

## 2018-06-24 NOTE — Telephone Encounter (Signed)
Spoke with patient, advised that Dr. Tamala Julian is okay but we need the okay from Dr. Lissa Hoard. Provider is not due to return to the office until [facility] Oct. At that time, pt should call back for decision. This encounter will be closed.

## 2018-06-30 ENCOUNTER — Inpatient Hospital Stay: Payer: PRIVATE HEALTH INSURANCE | Attending: Sports Medicine | Primary: Nurse Practitioner

## 2018-06-30 NOTE — Telephone Encounter (Signed)
Patient called in and informed me that she had gone to get her MRI just now and had did the blood work and they then informed her that they cancelled the MRI due to insurance so she is not sure what else to do. Please advise.

## 2018-07-01 NOTE — Telephone Encounter (Signed)
Call placed to scheduling to inquire the reason for patient's cancellation; and if this was perhaps associated with insurance issues. Reasons for patient appointment cancellation was because they are awaiting the PA to be approved then she will be contacted to be scheduled for another date and time. Patient contacted and updated on this no other questions or concerns at this time closing encounter.

## 2018-07-08 ENCOUNTER — Inpatient Hospital Stay: Admit: 2018-07-08 | Payer: PRIVATE HEALTH INSURANCE | Attending: Sports Medicine | Primary: Nurse Practitioner

## 2018-07-08 DIAGNOSIS — M48061 Spinal stenosis, lumbar region without neurogenic claudication: Secondary | ICD-10-CM

## 2018-07-08 LAB — CREATININE, POC
Creatinine, POC: 0.2 MG/DL — ABNORMAL LOW (ref 0.6–1.3)
GFRAA, POC: >60 mL/min/{1.73_m2} (ref >60)
GFRNA, POC: >60 mL/min/{1.73_m2} (ref >60)

## 2018-07-08 LAB — AMB POC CREATININE
Creatinine, POC: 0.2 MG/DL — ABNORMAL LOW (ref 0.6–1.3)
GFR African American: >60 mL/min/{1.73_m2} (ref >60)
GFR Non-African American: >60 mL/min/{1.73_m2} (ref >60)

## 2018-07-08 MED ORDER — GADOTERATE MEGLUMINE 0.5 MMOL/ML IV SYRINGE
0.5 mmol/mL | Freq: Once | INTRAVENOUS | Status: AC
Start: 2018-07-08 — End: 2018-07-08
  Administered 2018-07-08: 22:00:00 via INTRAVENOUS

## 2018-07-08 MED FILL — DOTAREM 0.5 MMOL/ML INTRAVENOUS SYRINGE: 0.5 mmol/mL | INTRAVENOUS | Qty: 15

## 2018-07-13 ENCOUNTER — Telehealth

## 2018-07-13 NOTE — Telephone Encounter (Signed)
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 We can send her to Pain Clinic to consider ESI or Nerve Block If she prefers. See Orders. Thanks.

## 2018-07-14 NOTE — Telephone Encounter (Signed)
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 We can send her to Pain Clinic to consider ESI or Nerve Block If she prefers.Left a message for patient on vm, will call again in the morning

## 2018-07-16 NOTE — Telephone Encounter (Signed)
Second attempt made to contact patient left a message on vm

## 2018-07-22 ENCOUNTER — Encounter: Attending: Surgery | Primary: Nurse Practitioner

## 2018-07-30 ENCOUNTER — Encounter: Attending: Surgery | Primary: Nurse Practitioner

## 2018-07-30 NOTE — Progress Notes (Deleted)
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 Ronnald Ramp 06/10/17 as well as a remote history of right breast cancer in 2008, s/p mastectomy.  Her Oncotype returned as 13 with an 8% 5 year recurrence with Tamoxifen alone. She has been started on ovarian suppression and aromatase inhibitor (Lupron and Femara) per Dr. Brent General.     She initially presented August 2018  for evaluation regarding a new diagnosis of a left breast cancer.  The patient had a recent left breast mammogram that showed an area of calcifications and possibly small mass in about the 1130 position 3 cm from the nipple measuring about 1.8 cm.  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  The tumor was estrogen and progesterone receptor positive, HER-2 negative.    Of note in 2008 the patient developed right breast cancer.  She believes she had gene testing at that time when she was 52 years old.  She underwent a mastectomy and had neoadjuvant chemotherapy.  Subsequently she had radiation therapy.  She apparently had some latissimus dorsi flap reconstruction but this failed and then subsequent to that she had an implant.  Subsequent to that she had a reduction mammoplasty of the left breast.  She has not been aware of any recurrent disease since her first cancer until the development of this new cancer in the other breast.  She states a pill was not recommended at that time.    She underwent revision of scar with excision of necrotic tissue and removal of expander by Dr. Ronnald Ramp 07/09/17 and subsequent submuscular placement 09/17/17 followed by removal of tissue expanders on both sides with placement of right permanent breast prosthesis 04/01/18.      She is taking Femara and Lupron per Dr. Brent General. She is overall doing well and is without complaint related to her breast.***          Allergies   Allergen Reactions   ??? Tramadol Anaphylaxis   ??? Metformin Diarrhea   ??? Trulicity [Dulaglutide] Nausea Only     Past Medical History:   Diagnosis Date   ??? Back pain    ??? Breast cancer ([place]) 2008 or 2009   ??? Chronic pain    ??? Diabetes ([place])    ??? FH: chemotherapy    ??? Hypertension    ??? Nausea & vomiting    ??? Neuropathy    ??? Radiation therapy complication 1062    NOT complication, just radiation     Past Surgical History:   Procedure Laterality Date   ??? BIOPSY BREAST     ??? BIOPSY FINE NEEDLE     ??? HX BREAST AUGMENTATION Right 2010   ??? HX BREAST AUGMENTATION Left 09/17/2017    left breast reconstruction with exchange of tissue expander per breast prostesis and left breast tissue expander performed by [name], MD at [place]   ??? HX BREAST AUGMENTATION Right 04/01/2018    removal of tissue expanders with placment of right permanent breast prostesis performed by [name], MD at [place]   ??? HX BREAST RECONSTRUCTION Right 2011    per pt - attempted latissimus dorsi flap, then saline implant   ??? HX BREAST RECONSTRUCTION Bilateral 06/10/2017    right breast prosthesis and  capsule removal and bilateral breast tissue  expander reconstruction.  performed by [name], MD at [place]   ??? HX BREAST RECONSTRUCTION Left 07/09/2017    left breast  EXCISION and closure of wound performed by [name], MD at [place]   ??? HX BREAST RECONSTRUCTION Bilateral     Tissue expanders, to prepare for implants    ??? HX CESAREAN SECTION     ??? HX LUMBAR DISKECTOMY      herniated disc   ??? HX MASTECTOMY Right 2010    Total mastectomy per pt.     ??? HX MASTECTOMY Left 06/10/2017    left mastectomy,Sentinal node biopsies. performed by [name], MD at [facility] MAIN OR   ??? HX MASTECTOMY Bilateral    ??? HX MASTOPEXY (BREAST LIFT) Left 2011    ??? IMPLANT BREAST SILICONE/EQ Right 0109    post mastectomy   ??? THUMB SUPPORT      thumb surgery to mobilize thumb     Social History     Socioeconomic History   ??? Marital status: MARRIED     Spouse name: Not on file   ??? Number of children: Not on file   ??? Years of education: Not on file   ??? Highest education level: Not on file   Tobacco Use   ??? Smoking status: Current Every Day Smoker     Packs/day: 0.50   ??? Smokeless tobacco: Never Used   ??? Tobacco comment: 5-6 cigs per day   Substance and Sexual Activity   ??? Alcohol use: No   ??? Drug use: No   Other Topics Concern     REVIEW OF SYSTEMS     Constitutional: No fever, weight loss, fatigue or recent chills.   Skin:  No recent rashes, dermatitis or abnormal moles.   HEENT:  No changes in vision, vertigo, epistaxis, dysphasia, or hoarseness.   Cardiac:  No chest pain, palpitations, or edema.    Respiratory: No chronic cough, shortness of breath, wheezing, hemoptysis, or history of sleep apnea.  Patient has been a longtime smoker and still smokes about 1 pack of cigarettes per day-probable mild COPD   Breasts/GYN:   See the history of present illness   Gastrointestinal:  No significant food intolerances, no recent vomiting, no chronic abdominal pain, no change in bowel habits, no melena. No history of GERD.   Genitourinary:  No history of hematuria, dysuria, frequency, or stress urinary incontinence. No nocturia.   Musculoskeletal: No weakness, joint pains, or arthritis.  Patient does have a history of chronic back pain   Endocrine:  No history of thyroid disease.  History of insulin-dependent diabetes with neuropathy.  Patient's previous hemoglobin A1c was over 8   Lymph/hemo:  No history of blood transfusions or easy bruising. No anemia.  Patient states that she had "blood clots" with previous surgeries but she pointed to her chest   Neuro: No dizziness or headaches or fainting.  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    PHYSICAL EXAM     There were no vitals taken for this visit.       Constitutional:  Well-developed, well-nourished, no acute distress.   Breasts:   Right: Examined in both the supine and upright positions.  There was no supraclavicular, infraclavicular, or axillary lympadenopathy.   There were no dominant masses, no skin changes, no asymmetry identified s/p mastectomy, implant intact, ***  Left: Examined in both the supine and upright positions.  There was no supraclavicular, infraclavicular, or axillary lympadenopathy.   There were no dominant masses, no skin changes, no asymmetry identified s/p mastectomy, no reconstruction,  ***      Pathology:  06/10/17   A: SENTINEL LYMPH NODE #1, LEFT AXILLA, BIOPSY:   ONE LYMPH NODE, NEGATIVE FOR MALIGNANCY (0/1).   B: SENTINEL LYMPH NODE #2, LEFT AXILLA, BIOPSY:   ONE LYMPH NODE, NEGATIVE FOR MALIGNANCY (0/1).   C: LEFT BREAST, SIMPLE MASTECTOMY:   INVASIVE CARCINOMA OF THE BREAST.   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   Nuclear Grade: Grade II (intermediate)   Lobular Carcinoma In Situ (LCIS): Present   Tumor Extent   Nipple DCIS: DCIS does not involve the nipple epidermis   Accessory Findings   Treatment Effect in the Breast: No known presurgical therapy   Treatment Effect in the Lymph Nodes: No known presurgical therapy   MARGINS   Invasive Carcinoma Margins: Uninvolved by invasive carcinoma   Distance from Closest Margin in Millimeters: Distance is > 10 Millimeters   Closest Margin: Posterior   DCIS Margins: Uninvolved by DCIS    Distance of DCIS from Closest Margin in Millimeters: Distance is > 10 Millimeters   Closest Margin: Posterior   LYMPH NODES   Number of Lymph Nodes with Macrometastases (> 2 mm): 0   Number of Lymph Nodes with Micrometastases (> 0.2 mm to 2 mm and / or > 200 cells): 0   Number of Lymph Nodes with Isolated Tumor Cells (<= 0.2 mm and <= 200 cells): 0   Number of Lymph Nodes Examined: 2 (see above)   Number of Sentinel Nodes Examined: 2   PATHOLOGIC STAGE CLASSIFICATION (pTNM, AJCC 8th Edition)   Primary Tumor (Invasive Carcinoma) (pT): pT1c   Regional Lymph Nodes (pN)   Modifier: (sn): Only sentinel node(s) evaluated.   Category (pN): pN0   D: RIGHT BREAST, CAPSULECTOMY:   BENIGN SOFT TISSUE IDENTIFIED.   E: IMPLANT, RIGHT BREAST, REMOVAL:   BREAST PROSTHESIS IDENTIFIED (GROSS EXAMINATION ONLY).       05/16/17   LEFT BREAST MASS AT 11:30 O'CLOCK (2-3 CM FROM NIPPLE), CORE BIOPSIES:   INFILTRATING CARCINOMA, NUCLEAR GRADE 1, WITH MIXED DUCTAL AND LOBULAR   FEATURES.   ESTROGEN RECEPTOR: POSITIVE (100%) POSITIVE > 1%   PROGESTERONE RECEPTOR: POSITIVE (100%) POSITIVE > 1%   Negative for Her-2 amplification       Patient Active Problem List   Diagnosis Code   ??? Essential hypertension I10   ??? DM type 2, uncontrolled, with neuropathy ([place]) E11.40, E11.65   ??? Pain of left hip joint M25.552   ??? Low back pain M54.5   ??? Smoker F17.200   ??? Dupuytren contracture M72.0   ??? S/P mastectomy, left Z90.12   ??? Cigarette nicotine dependence without complication G64.403   ??? Recurrent depression ([facility]) F33.9   ??? 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 Ronnald Ramp 06/10/17 as well as a history of right breast cancer in 2008. Continue ovarian suppression and aromatase inhibitor (Lupron and Femara) per Dr. Brent General. I have recommended she  discuss her CT scan from July with Dr. Brent General.    Tobacco cessation again recommended.  Follow up 6*** months.  Please call sooner with questions or concerns.

## 2018-08-03 ENCOUNTER — Ambulatory Visit: Attending: Sports Medicine | Primary: Nurse Practitioner

## 2018-08-03 ENCOUNTER — Ambulatory Visit
Admit: 2018-08-03 | Discharge: 2018-08-03 | Payer: PRIVATE HEALTH INSURANCE | Attending: Sports Medicine | Primary: Nurse Practitioner

## 2018-08-03 ENCOUNTER — Encounter: Attending: Sports Medicine | Primary: Nurse Practitioner

## 2018-08-03 DIAGNOSIS — S63592S Other specified sprain of left wrist, sequela: Secondary | ICD-10-CM

## 2018-08-03 NOTE — Progress Notes (Signed)
 Room #  5  Chief Complaint:    MRI results   Wrist pain    HPI:    Barbara [name] is a 52 y.o. female who presents today for c/o for MRI results and wrist pain    1. Have you been to the ER, urgent care clinic since your last visit?  Hospitalized since your last visit?NO    2. Have you seen or consulted any other health care providers outside of the [facility] since your last visit?  Include any pap smears or colon screening.NO  When :  Reason:    Health Maintenance reviewed Yes Patient declined flu Does not do it ever      Health Maintenance Due   Topic Date Due   . FOOT EXAM Q1  11/27/1977   . PAP AKA CERVICAL CYTOLOGY  11/27/1988   . Shingrix Vaccine Age 36> (1 of 2) 11/27/2017   . FOBT Q 1 YEAR AGE 79-75  11/27/2017   . MICROALBUMIN Q1  01/29/2018   . Influenza Age 37 to Adult  05/14/2018   . LIPID PANEL Q1  05/15/2018   . HEMOGLOBIN A1C Q6M  08/26/2018

## 2018-08-03 NOTE — Progress Notes (Signed)
Impression / Plan     Diagnoses and all orders for this visit:    1. TFCC (triangular fibrocartilage complex) tear, left, sequela  -     XR WRIST RT AP/LAT/OBL MIN 3V; Future  -     AMB SUPPLY ORDER    2. Lumbar disc disease    3. Other depression    Plan: Awaiting appointment with Pain Clinic to discuss ESI, continue Gabapentin 800 mg TID. Will continue Effexor 75 mg BID and schedule Follow Up with Behavioral Health, Follow Up in 6 weeks. Suspect TFCC Injury RIGHT Wrist, Early Dupuytren's Contracture, advise NSAID's and period of Immobilization with Wrist Brace, may consider Injection Therapy in the future. Obtain Plain Films RIGHT Wrist today.     Follow-up and Dispositions    ?? Return in about 6 weeks (around 09/14/2018) for CPE, DM, Depression.       HPI   Barbara [name] is a 52 y.o.female presenting for Follow Up from 06/22/2018, DX- Post Laminectomy Syndrome, Hot Flashes and Depression. Major complaint of RIGHT Wrist Pain. MRI of the Lumbar Spine shows L4-L5 degenerative disc disease with Both L5 Nerve root impingement. Appointment pending with Pain Clinic for [facility]. No help with Titration of Neurontin. Barbara [name] reports benefit in Hot Flashes and Depression with Effexor, No Follow Up with [facility] for CBT. Regarding RIGHT Wrist Pain, denies specific injury or trauma. Location of most pain Ulnar. No ecchymosis or soft tissue edema. Positive clicking and locking of the Wrist. Barbara [name] also has a history of Dupuytren's Contracture. Onset of symptoms for close to 2 weeks.     Medications     Outpatient Medications Prior to Visit   Medication Sig Dispense Refill   ??? venlafaxine-SR (EFFEXOR-XR) 75 mg capsule Take 2 Caps by mouth daily. 90 Cap 3   ??? gabapentin (NEURONTIN) 400 mg capsule 800 mg PO TID 270 Cap 3   ??? cephALEXin (KEFLEX) 250 mg capsule Take 2 Caps by mouth three (3) times daily. 30 Cap 2   ??? cephALEXin (KEFLEX) 250 mg capsule Take 2 Caps by mouth three (3) times daily. 30 Cap 2   ??? ondansetron  (ZOFRAN ODT) 8 mg disintegrating tablet Take 1 Tab by mouth every eight (8) hours as needed for Nausea. 10 Tab 0   ??? sAXagliptin (ONGLYZA) 5 mg tab tablet Take 1 Tab by mouth daily. 90 Tab 1   ??? insulin NPH/insulin regular (NOVOLIN 70/30, HUMULIN 70/30) 100 unit/mL (70-30) injection 35 Units Daily (before breakfast) AND 30 Units Daily (before dinner). 20 mL 3   ??? ramipril (ALTACE) 10 mg capsule Take 1 Cap by mouth daily. 90 Cap 3   ??? atorvastatin (LIPITOR) 10 mg tablet Take 1 Tab by mouth daily. 90 Tab 1   ??? nystatin (MYCOSTATIN) powder Apply to rash 4 times a day 60 g 3   ??? ketoconazole (NIZORAL) 2 % topical cream Apply  to affected area two (2) times a day. 60 g 3   ??? letrozole (FEMARA) 2.5 mg tablet Take 2.5 mg by mouth daily.       No facility-administered medications prior to visit.         Allergies     Allergies   Allergen Reactions   ??? Tramadol Anaphylaxis   ??? Metformin Diarrhea   ??? Trulicity [Dulaglutide] Nausea Only       Problem List     Patient Active Problem List    Diagnosis Date Noted   ??? Recurrent depression (  [place]) 02/23/2018   ??? History of breast cancer 02/23/2018   ??? Cigarette nicotine dependence without complication 95/18/8416   ??? S/P mastectomy, left 06/11/2017   ??? Smoker 05/22/2017   ??? Dupuytren contracture 05/22/2017   ??? Low back pain 03/04/2017   ??? Essential hypertension 01/29/2017   ??? DM type 2, uncontrolled, with neuropathy ([place]) 01/29/2017   ??? Pain of left hip joint 01/29/2017        Medical / Surgical / Family History     Past Medical History:   Diagnosis Date   ??? Back pain    ??? Breast cancer ([place]) 2008 or 2009   ??? Chronic pain    ??? Diabetes ([place])    ??? FH: chemotherapy    ??? Hypertension    ??? Nausea & vomiting    ??? Neuropathy    ??? Radiation therapy complication 6063    NOT complication, just radiation     Past Surgical History:   Procedure Laterality Date   ??? BIOPSY BREAST     ??? BIOPSY FINE NEEDLE     ??? HX BREAST AUGMENTATION Right 2010   ??? HX BREAST AUGMENTATION Left 09/17/2017    left  breast reconstruction with exchange of tissue expander per breast prostesis and left breast tissue expander performed by [name], MD at [place]   ??? HX BREAST AUGMENTATION Right 04/01/2018    removal of tissue expanders with placment of right permanent breast prostesis performed by [name], MD at [place]   ??? HX BREAST RECONSTRUCTION Right 2011    per pt - attempted latissimus dorsi flap, then saline implant   ??? HX BREAST RECONSTRUCTION Bilateral 06/10/2017    right breast prosthesis and capsule removal and bilateral breast tissue  expander reconstruction.  performed by [name], MD at [place]   ??? HX BREAST RECONSTRUCTION Left 07/09/2017    left breast  EXCISION and closure of wound performed by [name], MD at [place]   ??? HX BREAST RECONSTRUCTION Bilateral     Tissue expanders, to prepare for implants    ??? HX CESAREAN SECTION     ??? HX LUMBAR DISKECTOMY      herniated disc   ??? HX MASTECTOMY Right 2010    Total mastectomy per pt.     ??? HX MASTECTOMY Left 06/10/2017    left mastectomy,Sentinal node biopsies. performed by [name], MD at [facility] MAIN OR   ??? HX MASTECTOMY Bilateral    ??? HX MASTOPEXY (BREAST LIFT) Left 2011   ??? IMPLANT BREAST SILICONE/EQ Right 0160    post mastectomy   ??? THUMB SUPPORT      thumb surgery to mobilize thumb     Family History   Problem Relation Age of Onset   ??? Diabetes Mother    ??? No Known Problems Father        Social History     Social History     Socioeconomic History   ??? Marital status: MARRIED     Spouse name: Not on file   ??? Number of children: Not on file   ??? Years of education: Not on file   ??? Highest education level: Not on file   Occupational History   ??? Not on file   Social Needs   ??? Financial resource strain: Not on file   ??? Food insecurity:     Worry: Not on file     Inability: Not on file   ???  Transportation needs:     Medical: Not on file     Non-medical: Not on file   Tobacco Use   ??? Smoking status: Current Every Day  Smoker     Packs/day: 0.50   ??? Smokeless tobacco: Never Used   ??? Tobacco comment: 5-6 cigs per day   Substance and Sexual Activity   ??? Alcohol use: No   ??? Drug use: No   ??? Sexual activity: Not on file   Lifestyle   ??? Physical activity:     Days per week: Not on file     Minutes per session: Not on file   ??? Stress: Not on file   Relationships   ??? Social connections:     Talks on phone: Not on file     Gets together: Not on file     Attends religious service: Not on file     Active member of club or organization: Not on file     Attends meetings of clubs or organizations: Not on file     Relationship status: Not on file   ??? Intimate partner violence:     Fear of current or ex partner: Not on file     Emotionally abused: Not on file     Physically abused: Not on file     Forced sexual activity: Not on file   Other Topics Concern   ??? Military Service Not Asked   ??? Blood Transfusions Not Asked   ??? Caffeine Concern Not Asked   ??? Occupational Exposure Not Asked   ??? Hobby Hazards Not Asked   ??? Sleep Concern Not Asked   ??? Stress Concern Not Asked   ??? Weight Concern Not Asked   ??? Special Diet Not Asked   ??? Back Care Not Asked   ??? Exercise Not Asked   ??? Bike Helmet Not Asked   ??? Seat Belt Not Asked   ??? Self-Exams Not Asked   Social History Narrative   ??? Not on file     ROS   Review of Systems    10 Element ROS negative unless specifically stated in History of Present Illness.     Health Maintenance     Health Maintenance   Topic Date Due   ??? FOOT EXAM Q1  11/27/1977   ??? PAP AKA CERVICAL CYTOLOGY  11/27/1988   ??? Shingrix Vaccine Age 69> (1 of 2) 11/27/2017   ??? FOBT Q 1 YEAR AGE 98-75  11/27/2017   ??? MICROALBUMIN Q1  01/29/2018   ??? Influenza Age 52 to Adult  05/14/2018   ??? LIPID PANEL Q1  05/15/2018   ??? HEMOGLOBIN A1C Q6M  08/26/2018   ??? EYE EXAM RETINAL OR DILATED  03/27/2019   ??? BREAST CANCER SCRN MAMMOGRAM  05/14/2019   ??? DTaP/Tdap/Td series (2 - Td) 02/04/2024   ??? Bone Densitometry (Dexa) Screening  11/27/2032   ???  Pneumococcal 0-64 years  Completed     Physical Exam   BP 123/79 (BP 1 Location: Right arm, BP Patient Position: Sitting)    Pulse 89    Temp 97.9 ??F (36.6 ??C) (Oral)    Resp 20    Ht 5\' 2"  (1.575 m)    Wt 132 lb 9.6 oz (60.1 kg)    SpO2 97%    BMI 24.25 kg/m??     Physical Exam   Constitutional: She is oriented to person, place, and time. She appears well-developed and well-nourished. She appears distressed.  Mild Pain Distress   HENT:   Head: Normocephalic and atraumatic.   Eyes: Pupils are equal, round, and reactive to light. Conjunctivae and EOM are normal.   Cardiovascular: Normal rate, regular rhythm, normal heart sounds and intact distal pulses.   No murmur heard.  Pulmonary/Chest: Effort normal and breath sounds normal. No respiratory distress. She has no wheezes.   Musculoskeletal:   RIGHT Wrist: 5/5 Distal muscle strength. Pain with Radial Deviation. Positive Fovea Sign. Negative Snuffbox tenderness, Negative Finkelstein's Test. Stable DRUJ. Negative Scapholunate Ligament tenderness.    Neurological: She is alert and oriented to person, place, and time. No cranial nerve deficit. Coordination normal.   Skin: Skin is warm and dry. No rash noted. No erythema.   Psychiatric: She has a normal mood and affect. Her behavior is normal.     Ortho Exam    [name], DO

## 2018-08-03 NOTE — Progress Notes (Signed)
Room #  5  Chief Complaint:    MRI results   Wrist pain    HPI:    Barbara [name] is a 52 y.o. female who presents today for c/o for MRI results and wrist pain    1. Have you been to the ER, urgent care clinic since your last visit?  Hospitalized since your last visit?NO    2. Have you seen or consulted any other health care providers outside of the [facility] since your last visit?  Include any pap smears or colon screening.NO  When :  Reason:    Health Maintenance reviewed Yes Patient declined flu Does not do it ever      Health Maintenance Due   Topic Date Due   ??? FOOT EXAM Q1  11/27/1977   ??? PAP AKA CERVICAL CYTOLOGY  11/27/1988   ??? Shingrix Vaccine Age 32> (1 of 2) 11/27/2017   ??? FOBT Q 1 YEAR AGE 37-75  11/27/2017   ??? MICROALBUMIN Q1  01/29/2018   ??? Influenza Age 48 to Adult  05/14/2018   ??? LIPID PANEL Q1  05/15/2018   ??? HEMOGLOBIN A1C Q6M  08/26/2018

## 2018-08-03 NOTE — Progress Notes (Signed)
Impression / Plan     Diagnoses and all orders for this visit:    1. TFCC (triangular fibrocartilage complex) tear, left, sequela  -     XR WRIST RT AP/LAT/OBL MIN 3V; Future  -     AMB SUPPLY ORDER    2. Lumbar disc disease    3. Other depression    Plan: Awaiting appointment with Pain Clinic to discuss ESI, continue Gabapentin 800 mg TID. Will continue Effexor 75 mg BID and schedule Follow Up with Behavioral Health, Follow Up in 6 weeks. Suspect TFCC Injury RIGHT Wrist, Early Dupuytren's Contracture, advise NSAID's and period of Immobilization with Wrist Brace, may consider Injection Therapy in the future. Obtain Plain Films RIGHT Wrist today.     Follow-up and Dispositions    ?? Return in about 6 weeks (around 09/14/2018) for CPE, DM, Depression.       HPI   Barbara [name] is a 52 y.o.female presenting for Follow Up from 06/22/2018, DX- Post Laminectomy Syndrome, Hot Flashes and Depression. Major complaint of RIGHT Wrist Pain. MRI of the Lumbar Spine shows L4-L5 degenerative disc disease with Both L5 Nerve root impingement. Appointment pending with Pain Clinic for [facility]. No help with Titration of Neurontin. Barbara [name] reports benefit in Hot Flashes and Depression with Effexor, No Follow Up with [place] for CBT. Regarding RIGHT Wrist Pain, denies specific injury or trauma. Location of most pain Ulnar. No ecchymosis or soft tissue edema. Positive clicking and locking of the Wrist. Barbara [name] also has a history of Dupuytren's Contracture. Onset of symptoms for close to 2 weeks.     Medications     Outpatient Medications Prior to Visit   Medication Sig Dispense Refill   ??? venlafaxine-SR (EFFEXOR-XR) 75 mg capsule Take 2 Caps by mouth daily. 90 Cap 3   ??? gabapentin (NEURONTIN) 400 mg capsule 800 mg PO TID 270 Cap 3   ??? cephALEXin (KEFLEX) 250 mg capsule Take 2 Caps by mouth three (3) times daily. 30 Cap 2   ??? cephALEXin (KEFLEX) 250 mg capsule Take 2 Caps by mouth three (3) times daily. 30 Cap 2    ??? ondansetron (ZOFRAN ODT) 8 mg disintegrating tablet Take 1 Tab by mouth every eight (8) hours as needed for Nausea. 10 Tab 0   ??? sAXagliptin (ONGLYZA) 5 mg tab tablet Take 1 Tab by mouth daily. 90 Tab 1   ??? insulin NPH/insulin regular (NOVOLIN 70/30, HUMULIN 70/30) 100 unit/mL (70-30) injection 35 Units Daily (before breakfast) AND 30 Units Daily (before dinner). 20 mL 3   ??? ramipril (ALTACE) 10 mg capsule Take 1 Cap by mouth daily. 90 Cap 3   ??? atorvastatin (LIPITOR) 10 mg tablet Take 1 Tab by mouth daily. 90 Tab 1   ??? nystatin (MYCOSTATIN) powder Apply to rash 4 times a day 60 g 3   ??? ketoconazole (NIZORAL) 2 % topical cream Apply  to affected area two (2) times a day. 60 g 3   ??? letrozole (FEMARA) 2.5 mg tablet Take 2.5 mg by mouth daily.       No facility-administered medications prior to visit.         Allergies     Allergies   Allergen Reactions   ??? Tramadol Anaphylaxis   ??? Metformin Diarrhea   ??? Trulicity [Dulaglutide] Nausea Only       Problem List     Patient Active Problem List    Diagnosis Date Noted   ??? Recurrent depression (  [place]) 02/23/2018   ??? History of breast cancer 02/23/2018   ??? Cigarette nicotine dependence without complication 16/07/9603   ??? S/P mastectomy, left 06/11/2017   ??? Smoker 05/22/2017   ??? Dupuytren contracture 05/22/2017   ??? Low back pain 03/04/2017   ??? Essential hypertension 01/29/2017   ??? DM type 2, uncontrolled, with neuropathy ([place]) 01/29/2017   ??? Pain of left hip joint 01/29/2017        Medical / Surgical / Family History     Past Medical History:   Diagnosis Date   ??? Back pain    ??? Breast cancer ([place]) 2008 or 2009   ??? Chronic pain    ??? Diabetes ([place])    ??? FH: chemotherapy    ??? Hypertension    ??? Nausea & vomiting    ??? Neuropathy    ??? Radiation therapy complication 5409    NOT complication, just radiation     Past Surgical History:   Procedure Laterality Date   ??? BIOPSY BREAST     ??? BIOPSY FINE NEEDLE     ??? HX BREAST AUGMENTATION Right 2010    ??? HX BREAST AUGMENTATION Left 09/17/2017    left breast reconstruction with exchange of tissue expander per breast prostesis and left breast tissue expander performed by [name], MD at [facility]   ??? HX BREAST AUGMENTATION Right 04/01/2018    removal of tissue expanders with placment of right permanent breast prostesis performed by [name], MD at [place]   ??? HX BREAST RECONSTRUCTION Right 2011    per pt - attempted latissimus dorsi flap, then saline implant   ??? HX BREAST RECONSTRUCTION Bilateral 06/10/2017    right breast prosthesis and capsule removal and bilateral breast tissue  expander reconstruction.  performed by [name], MD at [place]   ??? HX BREAST RECONSTRUCTION Left 07/09/2017    left breast  EXCISION and closure of wound performed by [name], MD at [place]   ??? HX BREAST RECONSTRUCTION Bilateral     Tissue expanders, to prepare for implants    ??? HX CESAREAN SECTION     ??? HX LUMBAR DISKECTOMY      herniated disc   ??? HX MASTECTOMY Right 2010    Total mastectomy per pt.     ??? HX MASTECTOMY Left 06/10/2017    left mastectomy,Sentinal node biopsies. performed by [name], [facility] at [facility]   ??? HX MASTECTOMY Bilateral    ??? HX MASTOPEXY (BREAST LIFT) Left 2011   ??? IMPLANT BREAST SILICONE/EQ Right 8119    post mastectomy   ??? THUMB SUPPORT      thumb surgery to mobilize thumb     Family History   Problem Relation Age of Onset   ??? Diabetes Mother    ??? No Known Problems Father        Social History     Social History     Socioeconomic History   ??? Marital status: MARRIED     Spouse name: Not on file   ??? Number of children: Not on file   ??? Years of education: Not on file   ??? Highest education level: Not on file   Occupational History   ??? Not on file   Social Needs   ??? Financial resource strain: Not on file   ??? Food insecurity:     Worry: Not on file     Inability: Not on file   ???  Transportation needs:     Medical: Not on file     Non-medical: Not on file    Tobacco Use   ??? Smoking status: Current Every Day Smoker     Packs/day: 0.50   ??? Smokeless tobacco: Never Used   ??? Tobacco comment: 5-6 cigs per day   Substance and Sexual Activity   ??? Alcohol use: No   ??? Drug use: No   ??? Sexual activity: Not on file   Lifestyle   ??? Physical activity:     Days per week: Not on file     Minutes per session: Not on file   ??? Stress: Not on file   Relationships   ??? Social connections:     Talks on phone: Not on file     Gets together: Not on file     Attends religious service: Not on file     Active member of club or organization: Not on file     Attends meetings of clubs or organizations: Not on file     Relationship status: Not on file   ??? Intimate partner violence:     Fear of current or ex partner: Not on file     Emotionally abused: Not on file     Physically abused: Not on file     Forced sexual activity: Not on file   Other Topics Concern   ??? Military Service Not Asked   ??? Blood Transfusions Not Asked   ??? Caffeine Concern Not Asked   ??? Occupational Exposure Not Asked   ??? Hobby Hazards Not Asked   ??? Sleep Concern Not Asked   ??? Stress Concern Not Asked   ??? Weight Concern Not Asked   ??? Special Diet Not Asked   ??? Back Care Not Asked   ??? Exercise Not Asked   ??? Bike Helmet Not Asked   ??? Seat Belt Not Asked   ??? Self-Exams Not Asked   Social History Narrative   ??? Not on file     ROS   Review of Systems    10 Element ROS negative unless specifically stated in History of Present Illness.     Health Maintenance     Health Maintenance   Topic Date Due   ??? FOOT EXAM Q1  11/27/1977   ??? PAP AKA CERVICAL CYTOLOGY  11/27/1988   ??? Shingrix Vaccine Age 80> (1 of 2) 11/27/2017   ??? FOBT Q 1 YEAR AGE 37-75  11/27/2017   ??? MICROALBUMIN Q1  01/29/2018   ??? Influenza Age 65 to Adult  05/14/2018   ??? LIPID PANEL Q1  05/15/2018   ??? HEMOGLOBIN A1C Q6M  08/26/2018   ??? EYE EXAM RETINAL OR DILATED  03/27/2019   ??? BREAST CANCER SCRN MAMMOGRAM  05/14/2019   ??? DTaP/Tdap/Td series (2 - Td) 02/04/2024    ??? Bone Densitometry (Dexa) Screening  11/27/2032   ??? Pneumococcal 0-64 years  Completed     Physical Exam   BP 123/79 (BP 1 Location: Right arm, BP Patient Position: Sitting)    Pulse 89    Temp 97.9 ??F (36.6 ??C) (Oral)    Resp 20    Ht 5\' 2"  (1.575 m)    Wt 132 lb 9.6 oz (60.1 kg)    SpO2 97%    BMI 24.25 kg/m??     Physical Exam   Constitutional: She is oriented to person, place, and time. She appears well-developed and well-nourished. She appears distressed.  Mild Pain Distress   HENT:   Head: Normocephalic and atraumatic.   Eyes: Pupils are equal, round, and reactive to light. Conjunctivae and EOM are normal.   Cardiovascular: Normal rate, regular rhythm, normal heart sounds and intact distal pulses.   No murmur heard.  Pulmonary/Chest: Effort normal and breath sounds normal. No respiratory distress. She has no wheezes.   Musculoskeletal:   RIGHT Wrist: 5/5 Distal muscle strength. Pain with Radial Deviation. Positive Fovea Sign. Negative Snuffbox tenderness, Negative Finkelstein's Test. Stable DRUJ. Negative Scapholunate Ligament tenderness.    Neurological: She is alert and oriented to person, place, and time. No cranial nerve deficit. Coordination normal.   Skin: Skin is warm and dry. No rash noted. No erythema.   Psychiatric: She has a normal mood and affect. Her behavior is normal.     Ortho Exam    [name], DO

## 2018-08-21 ENCOUNTER — Telehealth

## 2018-08-21 NOTE — Telephone Encounter (Signed)
LVM to Barbara [name] [phone] to see which provider did the patient see during her visit to [facility]. Unable to generate authorization due to unknown provider.  Also, patient was unable to be reached to clarify which doctor she saw. Awaiting for a call back.

## 2018-08-21 NOTE — Telephone Encounter (Signed)
Aldona Bar from [facility] called requesting a referral for the patient and also for it to be back dated to Nov 5th. Please assist.

## 2018-08-21 NOTE — Telephone Encounter (Signed)
Nurse spoke to [place], referral from insurance is needed. Please place referral for [facility].

## 2018-08-21 NOTE — Telephone Encounter (Signed)
Please assist.

## 2018-08-26 ENCOUNTER — Ambulatory Visit: Attending: Surgery | Primary: Nurse Practitioner

## 2018-08-26 ENCOUNTER — Ambulatory Visit
Admit: 2018-08-26 | Discharge: 2018-08-26 | Payer: PRIVATE HEALTH INSURANCE | Attending: Surgery | Primary: Nurse Practitioner

## 2018-08-26 DIAGNOSIS — C50412 Malignant neoplasm of upper-outer quadrant of left female breast: Secondary | ICD-10-CM

## 2018-08-26 DIAGNOSIS — Z170 Estrogen receptor positive status [ER+]: Secondary | ICD-10-CM

## 2018-08-26 NOTE — Progress Notes (Signed)
1. Have you been to the ER, urgent care clinic since your last visit?  Hospitalized since your last visit?No    2. Have you seen or consulted any other health care providers outside of the [facility] since your last visit?  Include any pap smears or colon screening. Yes Dr. Emilie Rutter     Patient presents for follow up with ER/PR positive, HER negative T1cN0 left breast cancer s/p left mastectomy, sentinel node biopsy with reconstruction and revision of right breast per Dr. Yetta Barre 06/10/17 as well as a remote history of right breast cancer in 2008, s/p mastectomy.

## 2018-08-26 NOTE — Progress Notes (Signed)
Progress Notes by Reita Cliche, MD at 08/26/18 1400                Author: Reita Cliche, MD  Service: --  Author Type: Physician       Filed: 08/26/18 1441  Encounter Date: 08/26/2018  Status: Signed          Editor: Reita Cliche, MD (Physician)                    Breat Cancer       Barbara [name] is a 52 year old woman with ER/PR positive, HER negative T1cN0 left breast cancer s/p left mastectomy, sentinel node biopsy  with reconstruction and revision of right breast per Dr. Ronnald Ramp 06/10/17 as well as a remote history of right breast cancer in 2008, s/p mastectomy.  Her Oncotype returned as 13 with an 8% 5 year recurrence with Tamoxifen alone. She has been started on  ovarian suppression and aromatase inhibitor (Lupron and Femara) per Dr. Brent General.       She initially presented August 2018  for evaluation regarding a new diagnosis of a left breast cancer.  The patient had a recent left breast mammogram that showed an area of calcifications and possibly small mass in about the 1130 position 3 cm from the  nipple measuring about 1.8 cm.  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  The tumor was estrogen and progesterone receptor positive, HER-2 negative.      Of note in 2008 the patient developed right breast cancer.  She believes she had gene testing at that time when she was 52 years old.  She underwent a mastectomy and had neoadjuvant chemotherapy.  Subsequently she had radiation therapy.  She apparently  had some latissimus dorsi flap reconstruction but this failed and then subsequent to that she had an implant.  Subsequent to that she had a reduction mammoplasty of the left breast.  She has not been aware of any recurrent disease since her first cancer  until the development of this new cancer in the other breast.  She states a pill was not recommended at that time.      She underwent revision of scar  with excision of necrotic tissue and removal of expander by Dr. Ronnald Ramp 07/09/17 and subsequent submuscular placement 09/17/17 followed by removal  of tissue expanders on both sides with placement of right permanent breast prosthesis 04/01/18.       She is taking Femara and Lupron per Dr. Brent General. She is overall doing well and is without complaint related to her breast.                 Allergies        Allergen  Reactions         ?  Tramadol  Anaphylaxis     ?  Metformin  Diarrhea         ?  Trulicity [Dulaglutide]  Nausea Only          Past Medical History:        Diagnosis  Date         ?  Back pain       ?  Breast cancer ([place])  2008 or 2009     ?  Chronic pain       ?  Diabetes ([place])       ?  FH: chemotherapy       ?  Hypertension       ?  Nausea & vomiting       ?  Neuropathy       ?  Radiation therapy complication  6659          NOT complication, just radiation          Past Surgical History:         Procedure  Laterality  Date          ?  BIOPSY BREAST         ?  BIOPSY FINE NEEDLE         ?  HX BREAST AUGMENTATION  Right  2010     ?  HX BREAST AUGMENTATION  Left  09/17/2017          left breast reconstruction with exchange of tissue expander per breast prostesis and left breast tissue expander performed by [name],  MD at [place]          ?  HX BREAST AUGMENTATION  Right  04/01/2018          removal of tissue expanders with placment of right permanent breast prostesis performed by [name], MD at [facility]          ?  HX BREAST RECONSTRUCTION  Right  2011          per pt - attempted latissimus dorsi flap, then saline implant          ?  HX BREAST RECONSTRUCTION  Bilateral  06/10/2017          right breast prosthesis and capsule removal and bilateral breast tissue  expander reconstruction.  performed by [name], MD at [place]          ?  HX BREAST RECONSTRUCTION  Left  07/09/2017          left breast  EXCISION and closure of wound performed by [name], MD at [place]          ?  HX BREAST RECONSTRUCTION  Bilateral            Tissue expanders, to prepare for implants           ?  HX CESAREAN SECTION         ?  HX LUMBAR DISKECTOMY              herniated disc          ?  HX MASTECTOMY  Right  2010          Total mastectomy per pt.            ?  HX MASTECTOMY  Left  06/10/2017          left mastectomy,Sentinal node biopsies. performed by [name], MD at [place]          ?  HX MASTECTOMY  Bilateral       ?  HX MASTOPEXY (BREAST LIFT)  Left  2011     ?  IMPLANT BREAST SILICONE/EQ  Right  9357          post mastectomy          ?  THUMB SUPPORT              thumb surgery to mobilize thumb          Social History          Socioeconomic History         ?  Marital status:  MARRIED              Spouse name:  Not on file         ?  Number of children:  Not on file     ?  Years of education:  Not on file     ?  Highest education level:  Not on file       Tobacco Use         ?  Smoking status:  Current Every Day Smoker              Packs/day:  0.50         ?  Smokeless tobacco:  Never Used        ?  Tobacco comment: 5-6 cigs per day       Substance and Sexual Activity         ?  Alcohol use:  No     ?  Drug use:  No        Other Topics  Concern        REVIEW OF SYSTEMS       Constitutional: No fever, weight loss, fatigue or recent chills.    Skin:  No recent rashes, dermatitis or abnormal moles.    HEENT:  No changes in vision, vertigo, epistaxis, dysphasia, or hoarseness.    Cardiac:  No chest pain, palpitations, or edema.     Respiratory: No chronic cough, shortness of breath, wheezing, hemoptysis, or history of sleep apnea.  Patient has been a longtime smoker and still smokes about 1 pack of cigarettes per day-probable mild COPD    Breasts/GYN:   See the history of present illness    Gastrointestinal:  No significant food intolerances, no recent vomiting, no chronic abdominal pain, no change in bowel habits, no melena. No history of GERD.    Genitourinary:  No history of  hematuria, dysuria, frequency, or stress urinary incontinence. No nocturia.    Musculoskeletal: No weakness, joint pains, or arthritis.  Patient does have a history of chronic back pain    Endocrine:  No history of thyroid disease.  History of insulin-dependent diabetes with neuropathy.  Patient's previous hemoglobin A1c was over 8    Lymph/hemo:  No history of blood transfusions or easy bruising. No anemia.  Patient states that she had "blood clots" with previous surgeries but she pointed to her chest    Neuro: No dizziness or headaches or fainting.  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      PHYSICAL EXAM      There were no vitals taken for this visit.          Constitutional:  Well-developed, well-nourished, no acute distress.   Breasts:    Right: Examined in both the supine and upright positions.  There was no supraclavicular, infraclavicular, or axillary  lympadenopathy.   There were no dominant masses, no skin changes, no asymmetry identified s/p mastectomy, implant intact,    Left: Examined in both the supine and upright positions.  There was no supraclavicular, infraclavicular, or axillary lympadenopathy.    There were no dominant masses, no skin changes, no asymmetry identified s/p mastectomy, no reconstruction,            Pathology:   06/10/17    A: SENTINEL LYMPH NODE #1, LEFT AXILLA, BIOPSY:    ONE LYMPH NODE, NEGATIVE FOR MALIGNANCY (0/1).    B: SENTINEL LYMPH NODE #2, LEFT AXILLA, BIOPSY:  ONE LYMPH NODE, NEGATIVE FOR MALIGNANCY (0/1).    C: LEFT BREAST, SIMPLE MASTECTOMY:    INVASIVE CARCINOMA OF THE BREAST.    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    Nuclear Grade: Grade II (intermediate)    Lobular Carcinoma In Situ (LCIS): Present    Tumor Extent    Nipple DCIS: DCIS does not involve the nipple epidermis    Accessory Findings    Treatment Effect in the Breast: No known presurgical therapy    Treatment Effect in the Lymph Nodes: No known presurgical therapy    MARGINS    Invasive Carcinoma Margins: Uninvolved by invasive carcinoma    Distance from Closest Margin in Millimeters: Distance is > 10 Millimeters    Closest Margin: Posterior    DCIS Margins: Uninvolved by DCIS    Distance of DCIS from Closest Margin in Millimeters: Distance is > 10 Millimeters    Closest Margin: Posterior    LYMPH NODES    Number of Lymph Nodes with Macrometastases (> 2 mm): 0    Number of Lymph Nodes with Micrometastases (> 0.2 mm to 2 mm and / or > 200 cells): 0    Number of Lymph Nodes with Isolated Tumor Cells (<= 0.2 mm and <= 200 cells): 0    Number of Lymph Nodes Examined: 2 (see above)    Number of Sentinel Nodes Examined: 2    PATHOLOGIC STAGE CLASSIFICATION (pTNM, AJCC 8th Edition)    Primary Tumor (Invasive Carcinoma) (pT): pT1c    Regional Lymph Nodes (pN)    Modifier: (sn): Only sentinel node(s) evaluated.    Category (pN): pN0    D: RIGHT BREAST, CAPSULECTOMY:    BENIGN SOFT TISSUE IDENTIFIED.    E: IMPLANT, RIGHT BREAST, REMOVAL:    BREAST PROSTHESIS IDENTIFIED (GROSS EXAMINATION ONLY).          05/16/17    LEFT BREAST MASS AT 11:30 O'CLOCK (2-3 CM FROM NIPPLE), CORE BIOPSIES:    INFILTRATING CARCINOMA, NUCLEAR GRADE 1, WITH MIXED DUCTAL AND LOBULAR    FEATURES.    ESTROGEN RECEPTOR: POSITIVE (100%) POSITIVE > 1%    PROGESTERONE RECEPTOR: POSITIVE (100%) POSITIVE > 1%    Negative for Her-2 amplification            Patient Active Problem List        Diagnosis  Code         ?  Essential hypertension  I10     ?  DM type 2, uncontrolled, with neuropathy ([place])  E11.40, E11.65     ?  Pain of  left hip joint  M25.552     ?  Low back pain  M54.5     ?  Smoker  F17.200     ?  Dupuytren contracture  M72.0     ?  S/P mastectomy, left  Z90.12     ?  Cigarette nicotine dependence without complication  Q25.956     ?  Recurrent depression ([place])  F33.9         ?  History of breast cancer  Z85.40         52 year old woman with ER/PR positive, HER negative T1cN0 left breast cancer s/p left mastectomy, sentinel node biopsy with reconstruction and revision of right breast per Dr. Ronnald Ramp 06/10/17 as well as a history of right breast cancer in 2008. Continue  ovarian suppression and aromatase inhibitor (Lupron and Femara) per Dr. Brent General. I have recommended she discuss her CT scan from July with Dr. Brent General.    Tobacco cessation again recommended.  Follow up 6 months.  Please call sooner with questions or concerns.

## 2018-08-26 NOTE — Progress Notes (Signed)
1. Have you been to the ER, urgent care clinic since your last visit?  Hospitalized since your last visit?No    2. Have you seen or consulted any other health care providers outside of the [facility] since your last visit?  Include any pap smears or colon screening. Yes Dr. Brent General     Patient presents for follow up with ER/PR positive, HER negative T1cN0 left breast cancer s/p left mastectomy, sentinel node biopsy with reconstruction and revision of right breast per Dr. Ronnald Ramp 06/10/17 as well as a remote history of right breast cancer in 2008, s/p mastectomy.

## 2018-08-26 NOTE — Patient Instructions (Signed)
If you have any questions or concerns about today's appointment, the verbal and/or written instructions you were given for follow up care, please call our office at [phone].    [facility]  [address], [address]  [place] [address]    [phone] office  [phone]

## 2018-08-26 NOTE — Progress Notes (Signed)
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 Ronnald Ramp 06/10/17 as well as a remote history of right breast cancer in 2008, s/p mastectomy.  Her Oncotype returned as 13 with an 8% 5 year recurrence with Tamoxifen alone. She has been started on ovarian suppression and aromatase inhibitor (Lupron and Femara) per Dr. Brent General.     She initially presented August 2018  for evaluation regarding a new diagnosis of a left breast cancer.  The patient had a recent left breast mammogram that showed an area of calcifications and possibly small mass in about the 1130 position 3 cm from the nipple measuring about 1.8 cm.  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  The tumor was estrogen and progesterone receptor positive, HER-2 negative.    Of note in 2008 the patient developed right breast cancer.  She believes she had gene testing at that time when she was 52 years old.  She underwent a mastectomy and had neoadjuvant chemotherapy.  Subsequently she had radiation therapy.  She apparently had some latissimus dorsi flap reconstruction but this failed and then subsequent to that she had an implant.  Subsequent to that she had a reduction mammoplasty of the left breast.  She has not been aware of any recurrent disease since her first cancer until the development of this new cancer in the other breast.  She states a pill was not recommended at that time.    She underwent revision of scar with excision of necrotic tissue and removal of expander by Dr. Ronnald Ramp 07/09/17 and subsequent submuscular placement 09/17/17 followed by removal of tissue expanders on both sides with placement of right permanent breast prosthesis 04/01/18.      She is taking Femara and Lupron per Dr. Brent General. She is overall doing well and is without complaint related to her breast.          Allergies   Allergen Reactions   ??? Tramadol Anaphylaxis   ??? Metformin Diarrhea   ??? Trulicity [Dulaglutide] Nausea Only     Past Medical History:   Diagnosis Date   ??? Back pain    ??? Breast cancer ([place]) 2008 or 2009   ??? Chronic pain    ??? Diabetes ([place])    ??? FH: chemotherapy    ??? Hypertension    ??? Nausea & vomiting    ??? Neuropathy    ??? Radiation therapy complication 8101    NOT complication, just radiation     Past Surgical History:   Procedure Laterality Date   ??? BIOPSY BREAST     ??? BIOPSY FINE NEEDLE     ??? HX BREAST AUGMENTATION Right 2010   ??? HX BREAST AUGMENTATION Left 09/17/2017    left breast reconstruction with exchange of tissue expander per breast prostesis and left breast tissue expander performed by [name], MD at [place]   ??? HX BREAST AUGMENTATION Right 04/01/2018    removal of tissue expanders with placment of right permanent breast prostesis performed by [name], MD at [facility]   ??? HX BREAST RECONSTRUCTION Right 2011    per pt - attempted latissimus dorsi flap, then saline implant   ??? HX BREAST RECONSTRUCTION Bilateral 06/10/2017    right breast prosthesis and  capsule removal and bilateral breast tissue  expander reconstruction.  performed by [name], MD at [address]   ??? HX BREAST RECONSTRUCTION Left 07/09/2017    left breast  EXCISION and closure of wound performed by [name], MD at [place]   ??? HX BREAST RECONSTRUCTION Bilateral     Tissue expanders, to prepare for implants    ??? HX CESAREAN SECTION     ??? HX LUMBAR DISKECTOMY      herniated disc   ??? HX MASTECTOMY Right 2010    Total mastectomy per pt.     ??? HX MASTECTOMY Left 06/10/2017    left mastectomy,Sentinal node biopsies. performed by [name], [facility]   ??? HX MASTECTOMY Bilateral    ??? HX MASTOPEXY (BREAST LIFT) Left 2011    ??? IMPLANT BREAST SILICONE/EQ Right 9381    post mastectomy   ??? THUMB SUPPORT      thumb surgery to mobilize thumb     Social History     Socioeconomic History   ??? Marital status: MARRIED     Spouse name: Not on file   ??? Number of children: Not on file   ??? Years of education: Not on file   ??? Highest education level: Not on file   Tobacco Use   ??? Smoking status: Current Every Day Smoker     Packs/day: 0.50   ??? Smokeless tobacco: Never Used   ??? Tobacco comment: 5-6 cigs per day   Substance and Sexual Activity   ??? Alcohol use: No   ??? Drug use: No   Other Topics Concern     REVIEW OF SYSTEMS     Constitutional: No fever, weight loss, fatigue or recent chills.   Skin:  No recent rashes, dermatitis or abnormal moles.   HEENT:  No changes in vision, vertigo, epistaxis, dysphasia, or hoarseness.   Cardiac:  No chest pain, palpitations, or edema.    Respiratory: No chronic cough, shortness of breath, wheezing, hemoptysis, or history of sleep apnea.  Patient has been a longtime smoker and still smokes about 1 pack of cigarettes per day-probable mild COPD   Breasts/GYN:   See the history of present illness   Gastrointestinal:  No significant food intolerances, no recent vomiting, no chronic abdominal pain, no change in bowel habits, no melena. No history of GERD.   Genitourinary:  No history of hematuria, dysuria, frequency, or stress urinary incontinence. No nocturia.   Musculoskeletal: No weakness, joint pains, or arthritis.  Patient does have a history of chronic back pain   Endocrine:  No history of thyroid disease.  History of insulin-dependent diabetes with neuropathy.  Patient's previous hemoglobin A1c was over 8   Lymph/hemo:  No history of blood transfusions or easy bruising. No anemia.  Patient states that she had "blood clots" with previous surgeries but she pointed to her chest   Neuro: No dizziness or headaches or fainting.  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    PHYSICAL EXAM     There were no vitals taken for this visit.       Constitutional:  Well-developed, well-nourished, no acute distress.   Breasts:   Right: Examined in both the supine and upright positions.  There was no supraclavicular, infraclavicular, or axillary lympadenopathy.   There were no dominant masses, no skin changes, no asymmetry identified s/p mastectomy, implant intact,   Left: Examined in both the supine and upright positions.  There was no supraclavicular, infraclavicular, or axillary lympadenopathy.   There were no dominant masses, no skin changes, no asymmetry identified s/p mastectomy, no reconstruction,        Pathology:  06/10/17   A: SENTINEL LYMPH NODE #1, LEFT AXILLA, BIOPSY:   ONE LYMPH NODE, NEGATIVE FOR MALIGNANCY (0/1).   B: SENTINEL LYMPH NODE #2, LEFT AXILLA, BIOPSY:   ONE LYMPH NODE, NEGATIVE FOR MALIGNANCY (0/1).   C: LEFT BREAST, SIMPLE MASTECTOMY:   INVASIVE CARCINOMA OF THE BREAST.   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   Nuclear Grade: Grade II (intermediate)   Lobular Carcinoma In Situ (LCIS): Present   Tumor Extent   Nipple DCIS: DCIS does not involve the nipple epidermis   Accessory Findings   Treatment Effect in the Breast: No known presurgical therapy   Treatment Effect in the Lymph Nodes: No known presurgical therapy   MARGINS   Invasive Carcinoma Margins: Uninvolved by invasive carcinoma   Distance from Closest Margin in Millimeters: Distance is > 10 Millimeters   Closest Margin: Posterior   DCIS Margins: Uninvolved by DCIS    Distance of DCIS from Closest Margin in Millimeters: Distance is > 10 Millimeters   Closest Margin: Posterior   LYMPH NODES   Number of Lymph Nodes with Macrometastases (> 2 mm): 0   Number of Lymph Nodes with Micrometastases (> 0.2 mm to 2 mm and / or > 200 cells): 0   Number of Lymph Nodes with Isolated Tumor Cells (<= 0.2 mm and <= 200 cells): 0   Number of Lymph Nodes Examined: 2 (see above)   Number of Sentinel Nodes Examined: 2   PATHOLOGIC STAGE CLASSIFICATION (pTNM, AJCC 8th Edition)   Primary Tumor (Invasive Carcinoma) (pT): pT1c   Regional Lymph Nodes (pN)   Modifier: (sn): Only sentinel node(s) evaluated.   Category (pN): pN0   D: RIGHT BREAST, CAPSULECTOMY:   BENIGN SOFT TISSUE IDENTIFIED.   E: IMPLANT, RIGHT BREAST, REMOVAL:   BREAST PROSTHESIS IDENTIFIED (GROSS EXAMINATION ONLY).       05/16/17   LEFT BREAST MASS AT 11:30 O'CLOCK (2-3 CM FROM NIPPLE), CORE BIOPSIES:   INFILTRATING CARCINOMA, NUCLEAR GRADE 1, WITH MIXED DUCTAL AND LOBULAR   FEATURES.   ESTROGEN RECEPTOR: POSITIVE (100%) POSITIVE > 1%   PROGESTERONE RECEPTOR: POSITIVE (100%) POSITIVE > 1%   Negative for Her-2 amplification       Patient Active Problem List   Diagnosis Code   ??? Essential hypertension I10   ??? DM type 2, uncontrolled, with neuropathy ([place]) E11.40, E11.65   ??? Pain of left hip joint M25.552   ??? Low back pain M54.5   ??? Smoker F17.200   ??? Dupuytren contracture M72.0   ??? S/P mastectomy, left Z90.12   ??? Cigarette nicotine dependence without complication Z16.967   ??? Recurrent depression ([facility]) F33.9   ??? 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 Ronnald Ramp 06/10/17 as well as a history of right breast cancer in 2008. Continue ovarian suppression and aromatase inhibitor (Lupron and Femara) per Dr. Brent General. I have recommended she  discuss her CT scan from July with Dr. Brent General.    Tobacco cessation again recommended.  Follow up 6 months.  Please call sooner with questions or concerns.

## 2018-08-27 NOTE — Telephone Encounter (Signed)
Nurse from [facility] called in and was wanting to know if a referral could be put in for the patient and have it back dated to 08/26/18. The patient's insurance requires a referral. Please advise.

## 2018-08-31 ENCOUNTER — Encounter

## 2018-08-31 NOTE — Telephone Encounter (Addendum)
This is the Dx code Malignant neoplasm of upper-outer quadrant of left breast in female, estrogen receptor positive   (C 50.412) that needs to be associated with the referral that was requested by [facility] the patient's insurance requires it.. This message was sent previously and you requested a diagnosis code. It also needs to be back dated to 08/26/18. Patient was already seen by Dr Jennye Boroughs.

## 2018-09-07 ENCOUNTER — Encounter

## 2018-09-08 MED ORDER — ATORVASTATIN 10 MG TAB
10 mg | ORAL_TABLET | Freq: Every day | ORAL | 1 refills | Status: DC
Start: 2018-09-08 — End: 2019-03-19

## 2018-09-14 ENCOUNTER — Ambulatory Visit: Attending: Sports Medicine | Primary: Nurse Practitioner

## 2018-09-14 ENCOUNTER — Encounter: Attending: Sports Medicine | Primary: Nurse Practitioner

## 2018-09-14 ENCOUNTER — Ambulatory Visit
Admit: 2018-09-14 | Discharge: 2018-09-14 | Payer: PRIVATE HEALTH INSURANCE | Attending: Sports Medicine | Primary: Nurse Practitioner

## 2018-09-14 DIAGNOSIS — M7062 Trochanteric bursitis, left hip: Secondary | ICD-10-CM

## 2018-09-14 MED ORDER — LORAZEPAM 0.5 MG TAB
0.5 mg | ORAL_TABLET | ORAL | 0 refills | Status: DC | PRN
Start: 2018-09-14 — End: 2018-10-01

## 2018-09-14 MED ORDER — DICLOFENAC 75 MG TAB, DELAYED RELEASE
75 mg | ORAL_TABLET | Freq: Two times a day (BID) | ORAL | 3 refills | Status: DC
Start: 2018-09-14 — End: 2018-12-24

## 2018-09-14 NOTE — Progress Notes (Signed)
Impression / Plan     Diagnoses and all orders for this visit:    1. Trochanteric bursitis of left hip  -     REFERRAL TO PHYSICAL THERAPY  -     diclofenac EC (VOLTAREN) 75 mg EC tablet; Take 1 Tab by mouth two (2) times a day.  -     XR HIP LT W OR WO PELV 2-3 VWS; Future    2. Lumbar disc disease  -     REFERRAL TO PHYSICAL THERAPY  -     diclofenac EC (VOLTAREN) 75 mg EC tablet; Take 1 Tab by mouth two (2) times a day.    3. Other depression  -     LORazepam (ATIVAN) 0.5 mg tablet; Take 1 Tab by mouth every four (4) hours as needed for Anxiety. Max Daily Amount: 3 mg.    Plan: Will continue with Effexor and Trial short course of Ativan PRN for Depression, awaiting appointment with [facility]. Suspect LEFT Hip Trochanteric Bursitis, advise to Trial NSAID's and start PT for the Hip and Lumbar Spine while awaiting appointment with Pain Clinic. Will move forward with LEFT Hip Plain Films IF Mrs. Delmont reconsiders Trochanteric Bursal Injection on Follow Up. Add CPE on Follow Up in 6 weeks and Monitor Lumbar Spine and Hip.     Follow-up and Dispositions    ?? Return in about 6 weeks (around 10/26/2018) for CPE.       HPI   Barbara [name] is a 52 y.o.female presenting for Follow Up from 08/03/2018, DX- Lumbar Disc Disease. MRI of the Lumbar Spine 06/22/2018 shows L4-L5 lumbar disc disease S/P laminectomy. Awaiting appointment with Pain Clinic for Injection Therapy. Also, awaiting appointment with [facility] for Depression, currently on Effexor with benefit. Major complaint today of LEFT Lateral Hip Pain. Worse at NIGHT. Denies trauma or Falls. No mechanical symptoms.     Review of Systems   Constitutional: Negative.    HENT: Negative.    Eyes: Negative.    Respiratory: Negative.    Cardiovascular: Negative.    Gastrointestinal: Negative.    Genitourinary: Negative.    Musculoskeletal: Positive for back pain.   Skin: Negative.    Neurological: Positive for tingling and sensory change.   Endo/Heme/Allergies:  Negative.    Psychiatric/Behavioral: Positive for depression.        Medications     Outpatient Medications Prior to Visit   Medication Sig Dispense Refill   ??? atorvastatin (LIPITOR) 10 mg tablet Take 1 Tab by mouth daily. 90 Tab 1   ??? venlafaxine-SR (EFFEXOR-XR) 75 mg capsule Take 2 Caps by mouth daily. 90 Cap 3   ??? gabapentin (NEURONTIN) 400 mg capsule 800 mg PO TID 270 Cap 3   ??? ondansetron (ZOFRAN ODT) 8 mg disintegrating tablet Take 1 Tab by mouth every eight (8) hours as needed for Nausea. 10 Tab 0   ??? sAXagliptin (ONGLYZA) 5 mg tab tablet Take 1 Tab by mouth daily. 90 Tab 1   ??? insulin NPH/insulin regular (NOVOLIN 70/30, HUMULIN 70/30) 100 unit/mL (70-30) injection 35 Units Daily (before breakfast) AND 30 Units Daily (before dinner). 20 mL 3   ??? ramipril (ALTACE) 10 mg capsule Take 1 Cap by mouth daily. 90 Cap 3   ??? nystatin (MYCOSTATIN) powder Apply to rash 4 times a day 60 g 3   ??? ketoconazole (NIZORAL) 2 % topical cream Apply  to affected area two (2) times a day. 60 g 3   ???  letrozole (FEMARA) 2.5 mg tablet Take 2.5 mg by mouth daily.       No facility-administered medications prior to visit.      Allergies     Allergies   Allergen Reactions   ??? Tramadol Anaphylaxis   ??? Metformin Diarrhea   ??? Trulicity [Dulaglutide] Nausea Only     Problem List     Patient Active Problem List    Diagnosis Date Noted   ??? Recurrent depression ([place]) 02/23/2018   ??? History of breast cancer 02/23/2018   ??? Cigarette nicotine dependence without complication 38/75/6433   ??? S/P mastectomy, left 06/11/2017   ??? Smoker 05/22/2017   ??? Dupuytren contracture 05/22/2017   ??? Low back pain 03/04/2017   ??? Essential hypertension 01/29/2017   ??? DM type 2, uncontrolled, with neuropathy ([place]) 01/29/2017   ??? Pain of left hip joint 01/29/2017     Medical / Surgical / Family History     Past Medical History:   Diagnosis Date   ??? Back pain    ??? Breast cancer ([place]) 2008 or 2009   ??? Chronic pain    ??? Diabetes ([place])    ??? FH: chemotherapy    ???  Hypertension    ??? Nausea & vomiting    ??? Neuropathy    ??? Radiation therapy complication 2951    NOT complication, just radiation     Past Surgical History:   Procedure Laterality Date   ??? BIOPSY BREAST     ??? BIOPSY FINE NEEDLE     ??? HX BREAST AUGMENTATION Right 2010   ??? HX BREAST AUGMENTATION Left 09/17/2017    left breast reconstruction with exchange of tissue expander per breast prostesis and left breast tissue expander performed by [name], MD at [facility]   ??? HX BREAST AUGMENTATION Right 04/01/2018    removal of tissue expanders with placment of right permanent breast prostesis performed by [name], MD at [place]   ??? HX BREAST RECONSTRUCTION Right 2011    per pt - attempted latissimus dorsi flap, then saline implant   ??? HX BREAST RECONSTRUCTION Bilateral 06/10/2017    right breast prosthesis and capsule removal and bilateral breast tissue  expander reconstruction.  performed by [name], MD at [place]   ??? HX BREAST RECONSTRUCTION Left 07/09/2017    left breast  EXCISION and closure of wound performed by [name], MD at [place]   ??? HX BREAST RECONSTRUCTION Bilateral     Tissue expanders, to prepare for implants    ??? HX CESAREAN SECTION     ??? HX LUMBAR DISKECTOMY      herniated disc   ??? HX MASTECTOMY Right 2010    Total mastectomy per pt.     ??? HX MASTECTOMY Left 06/10/2017    left mastectomy,Sentinal node biopsies. performed by [name], [facility] at [facility] MAIN OR   ??? HX MASTECTOMY Bilateral    ??? HX MASTOPEXY (BREAST LIFT) Left 2011   ??? IMPLANT BREAST SILICONE/EQ Right 8841    post mastectomy   ??? THUMB SUPPORT      thumb surgery to mobilize thumb     Family History   Problem Relation Age of Onset   ??? Diabetes Mother    ??? No Known Problems Father      Social History     Social History     Socioeconomic History   ??? Marital status: MARRIED     Spouse  name: Not on file   ??? Number of children: Not on file   ??? Years of education: Not on file   ??? Highest education level:  Not on file   Occupational History   ??? Not on file   Social Needs   ??? Financial resource strain: Not on file   ??? Food insecurity:     Worry: Not on file     Inability: Not on file   ??? Transportation needs:     Medical: Not on file     Non-medical: Not on file   Tobacco Use   ??? Smoking status: Current Every Day Smoker     Packs/day: 0.50   ??? Smokeless tobacco: Never Used   ??? Tobacco comment: 5-6 cigs per day   Substance and Sexual Activity   ??? Alcohol use: No   ??? Drug use: No   ??? Sexual activity: Not on file   Lifestyle   ??? Physical activity:     Days per week: Not on file     Minutes per session: Not on file   ??? Stress: Not on file   Relationships   ??? Social connections:     Talks on phone: Not on file     Gets together: Not on file     Attends religious service: Not on file     Active member of club or organization: Not on file     Attends meetings of clubs or organizations: Not on file     Relationship status: Not on file   ??? Intimate partner violence:     Fear of current or ex partner: Not on file     Emotionally abused: Not on file     Physically abused: Not on file     Forced sexual activity: Not on file   Other Topics Concern   ??? Military Service Not Asked   ??? Blood Transfusions Not Asked   ??? Caffeine Concern Not Asked   ??? Occupational Exposure Not Asked   ??? Hobby Hazards Not Asked   ??? Sleep Concern Not Asked   ??? Stress Concern Not Asked   ??? Weight Concern Not Asked   ??? Special Diet Not Asked   ??? Back Care Not Asked   ??? Exercise Not Asked   ??? Bike Helmet Not Asked   ??? Seat Belt Not Asked   ??? Self-Exams Not Asked   Social History Narrative   ??? Not on file     ROS   Review of Systems    10 Element ROS negative unless specifically stated in History of Present Illness.     Health Maintenance     Health Maintenance   Topic Date Due   ??? PAP AKA CERVICAL CYTOLOGY  11/27/1988   ??? Shingrix Vaccine Age 78> (1 of 2) 11/27/2017   ??? FOBT Q 1 YEAR AGE 67-75  11/27/2017   ??? MICROALBUMIN Q1  01/29/2018   ??? Influenza Age 18 to  Adult  05/14/2018   ??? LIPID PANEL Q1  05/15/2018   ??? HEMOGLOBIN A1C Q6M  08/26/2018   ??? EYE EXAM RETINAL OR DILATED  03/27/2019   ??? BREAST CANCER SCRN MAMMOGRAM  05/14/2019   ??? FOOT EXAM Q1  08/13/2019   ??? DTaP/Tdap/Td series (2 - Td) 02/04/2024   ??? Bone Densitometry (Dexa) Screening  11/27/2032   ??? Pneumococcal 0-64 years  Completed     Physical Exam   BP 125/87 (BP 1 Location: Left arm, BP  Patient Position: Sitting) Comment (BP 1 Location): lower arm   Pulse (!) 101    Temp 98 ??F (36.7 ??C) (Oral)    Resp 17    Ht 5\' 2"  (1.575 m)    Wt 132 lb 9.6 oz (60.1 kg)    SpO2 98%    BMI 24.25 kg/m??     Physical Exam  Constitutional:       Appearance: Normal appearance. She is normal weight.      Comments: Mild Pain Distress   HENT:      Head: Normocephalic and atraumatic.      Nose: Nose normal.      Mouth/Throat:      Mouth: Mucous membranes are moist.      Pharynx: Oropharynx is clear.   Eyes:      Extraocular Movements: Extraocular movements intact.      Conjunctiva/sclera: Conjunctivae normal.      Pupils: Pupils are equal, round, and reactive to light.   Skin:     General: Skin is warm and dry.      Findings: No rash.   Neurological:      General: No focal deficit present.      Mental Status: She is alert and oriented to person, place, and time. Mental status is at baseline.      Gait: Gait abnormal.   Psychiatric:         Mood and Affect: Mood normal.         Behavior: Behavior normal.         Thought Content: Thought content normal.         Judgment: Judgment normal.       Left Hip Exam     Tenderness   The patient is experiencing tenderness in the greater trochanter.    Range of Motion   The patient has normal left hip ROM.    Muscle Strength   The patient has normal left hip strength.     Tests   Ober: negative    Other   Erythema: absent  Scars: absent  Sensation: normal  Pulse: present    Comments:  Negative FADIR.           [name], DO

## 2018-09-14 NOTE — Progress Notes (Signed)
Impression / Plan     Diagnoses and all orders for this visit:    1. Trochanteric bursitis of left hip  -     REFERRAL TO PHYSICAL THERAPY  -     diclofenac EC (VOLTAREN) 75 mg EC tablet; Take 1 Tab by mouth two (2) times a day.  -     XR HIP LT W OR WO PELV 2-3 VWS; Future    2. Lumbar disc disease  -     REFERRAL TO PHYSICAL THERAPY  -     diclofenac EC (VOLTAREN) 75 mg EC tablet; Take 1 Tab by mouth two (2) times a day.    3. Other depression  -     LORazepam (ATIVAN) 0.5 mg tablet; Take 1 Tab by mouth every four (4) hours as needed for Anxiety. Max Daily Amount: 3 mg.    Plan: Will continue with Effexor and Trial short course of Ativan PRN for Depression, awaiting appointment with [facility]. Suspect LEFT Hip Trochanteric Bursitis, advise to Trial NSAID's and start PT for the Hip and Lumbar Spine while awaiting appointment with Pain Clinic. Will move forward with LEFT Hip Plain Films IF Mrs. Raboin reconsiders Trochanteric Bursal Injection on Follow Up. Add CPE on Follow Up in 6 weeks and Monitor Lumbar Spine and Hip.     Follow-up and Dispositions    ?? Return in about 6 weeks (around 10/26/2018) for CPE.       HPI   Barbara [name] is a 52 y.o.female presenting for Follow Up from 08/03/2018, DX- Lumbar Disc Disease. MRI of the Lumbar Spine 06/22/2018 shows L4-L5 lumbar disc disease S/P laminectomy. Awaiting appointment with Pain Clinic for Injection Therapy. Also, awaiting appointment with [facility] for Depression, currently on Effexor with benefit. Major complaint today of LEFT Lateral Hip Pain. Worse at NIGHT. Denies trauma or Falls. No mechanical symptoms.     Review of Systems   Constitutional: Negative.    HENT: Negative.    Eyes: Negative.    Respiratory: Negative.    Cardiovascular: Negative.    Gastrointestinal: Negative.    Genitourinary: Negative.    Musculoskeletal: Positive for back pain.   Skin: Negative.    Neurological: Positive for tingling and sensory change.    Endo/Heme/Allergies: Negative.    Psychiatric/Behavioral: Positive for depression.        Medications     Outpatient Medications Prior to Visit   Medication Sig Dispense Refill   ??? atorvastatin (LIPITOR) 10 mg tablet Take 1 Tab by mouth daily. 90 Tab 1   ??? venlafaxine-SR (EFFEXOR-XR) 75 mg capsule Take 2 Caps by mouth daily. 90 Cap 3   ??? gabapentin (NEURONTIN) 400 mg capsule 800 mg PO TID 270 Cap 3   ??? ondansetron (ZOFRAN ODT) 8 mg disintegrating tablet Take 1 Tab by mouth every eight (8) hours as needed for Nausea. 10 Tab 0   ??? sAXagliptin (ONGLYZA) 5 mg tab tablet Take 1 Tab by mouth daily. 90 Tab 1   ??? insulin NPH/insulin regular (NOVOLIN 70/30, HUMULIN 70/30) 100 unit/mL (70-30) injection 35 Units Daily (before breakfast) AND 30 Units Daily (before dinner). 20 mL 3   ??? ramipril (ALTACE) 10 mg capsule Take 1 Cap by mouth daily. 90 Cap 3   ??? nystatin (MYCOSTATIN) powder Apply to rash 4 times a day 60 g 3   ??? ketoconazole (NIZORAL) 2 % topical cream Apply  to affected area two (2) times a day. 60 g 3   ???  letrozole (FEMARA) 2.5 mg tablet Take 2.5 mg by mouth daily.       No facility-administered medications prior to visit.      Allergies     Allergies   Allergen Reactions   ??? Tramadol Anaphylaxis   ??? Metformin Diarrhea   ??? Trulicity [Dulaglutide] Nausea Only     Problem List     Patient Active Problem List    Diagnosis Date Noted   ??? Recurrent depression ([place]) 02/23/2018   ??? History of breast cancer 02/23/2018   ??? Cigarette nicotine dependence without complication 02/72/5366   ??? S/P mastectomy, left 06/11/2017   ??? Smoker 05/22/2017   ??? Dupuytren contracture 05/22/2017   ??? Low back pain 03/04/2017   ??? Essential hypertension 01/29/2017   ??? DM type 2, uncontrolled, with neuropathy ([place]) 01/29/2017   ??? Pain of left hip joint 01/29/2017     Medical / Surgical / Family History     Past Medical History:   Diagnosis Date   ??? Back pain    ??? Breast cancer ([place]) 2008 or 2009   ??? Chronic pain    ??? Diabetes ([place])     ??? FH: chemotherapy    ??? Hypertension    ??? Nausea & vomiting    ??? Neuropathy    ??? Radiation therapy complication 4403    NOT complication, just radiation     Past Surgical History:   Procedure Laterality Date   ??? BIOPSY BREAST     ??? BIOPSY FINE NEEDLE     ??? HX BREAST AUGMENTATION Right 2010   ??? HX BREAST AUGMENTATION Left 09/17/2017    left breast reconstruction with exchange of tissue expander per breast prostesis and left breast tissue expander performed by [name], MD at [place]   ??? HX BREAST AUGMENTATION Right 04/01/2018    removal of tissue expanders with placment of right permanent breast prostesis performed by [name], MD at [place]   ??? HX BREAST RECONSTRUCTION Right 2011    per pt - attempted latissimus dorsi flap, then saline implant   ??? HX BREAST RECONSTRUCTION Bilateral 06/10/2017    right breast prosthesis and capsule removal and bilateral breast tissue  expander reconstruction.  performed by [name], MD at [place]   ??? HX BREAST RECONSTRUCTION Left 07/09/2017    left breast  EXCISION and closure of wound performed by [name], MD at [place]   ??? HX BREAST RECONSTRUCTION Bilateral     Tissue expanders, to prepare for implants    ??? HX CESAREAN SECTION     ??? HX LUMBAR DISKECTOMY      herniated disc   ??? HX MASTECTOMY Right 2010    Total mastectomy per pt.     ??? HX MASTECTOMY Left 06/10/2017    left mastectomy,Sentinal node biopsies. performed by [name], [facility] at [facility] MAIN OR   ??? HX MASTECTOMY Bilateral    ??? HX MASTOPEXY (BREAST LIFT) Left 2011   ??? IMPLANT BREAST SILICONE/EQ Right 4742    post mastectomy   ??? THUMB SUPPORT      thumb surgery to mobilize thumb     Family History   Problem Relation Age of Onset   ??? Diabetes Mother    ??? No Known Problems Father      Social History     Social History     Socioeconomic History   ??? Marital status: MARRIED     Spouse  name: Not on file   ??? Number of children: Not on file   ??? Years of education: Not on file    ??? Highest education level: Not on file   Occupational History   ??? Not on file   Social Needs   ??? Financial resource strain: Not on file   ??? Food insecurity:     Worry: Not on file     Inability: Not on file   ??? Transportation needs:     Medical: Not on file     Non-medical: Not on file   Tobacco Use   ??? Smoking status: Current Every Day Smoker     Packs/day: 0.50   ??? Smokeless tobacco: Never Used   ??? Tobacco comment: 5-6 cigs per day   Substance and Sexual Activity   ??? Alcohol use: No   ??? Drug use: No   ??? Sexual activity: Not on file   Lifestyle   ??? Physical activity:     Days per week: Not on file     Minutes per session: Not on file   ??? Stress: Not on file   Relationships   ??? Social connections:     Talks on phone: Not on file     Gets together: Not on file     Attends religious service: Not on file     Active member of club or organization: Not on file     Attends meetings of clubs or organizations: Not on file     Relationship status: Not on file   ??? Intimate partner violence:     Fear of current or ex partner: Not on file     Emotionally abused: Not on file     Physically abused: Not on file     Forced sexual activity: Not on file   Other Topics Concern   ??? Military Service Not Asked   ??? Blood Transfusions Not Asked   ??? Caffeine Concern Not Asked   ??? Occupational Exposure Not Asked   ??? Hobby Hazards Not Asked   ??? Sleep Concern Not Asked   ??? Stress Concern Not Asked   ??? Weight Concern Not Asked   ??? Special Diet Not Asked   ??? Back Care Not Asked   ??? Exercise Not Asked   ??? Bike Helmet Not Asked   ??? Seat Belt Not Asked   ??? Self-Exams Not Asked   Social History Narrative   ??? Not on file     ROS   Review of Systems    10 Element ROS negative unless specifically stated in History of Present Illness.     Health Maintenance     Health Maintenance   Topic Date Due   ??? PAP AKA CERVICAL CYTOLOGY  11/27/1988   ??? Shingrix Vaccine Age 606> (1 of 2) 11/27/2017   ??? FOBT Q 1 YEAR AGE 37-75  11/27/2017    ??? MICROALBUMIN Q1  01/29/2018   ??? Influenza Age 60 to Adult  05/14/2018   ??? LIPID PANEL Q1  05/15/2018   ??? HEMOGLOBIN A1C Q6M  08/26/2018   ??? EYE EXAM RETINAL OR DILATED  03/27/2019   ??? BREAST CANCER SCRN MAMMOGRAM  05/14/2019   ??? FOOT EXAM Q1  08/13/2019   ??? DTaP/Tdap/Td series (2 - Td) 02/04/2024   ??? Bone Densitometry (Dexa) Screening  11/27/2032   ??? Pneumococcal 0-64 years  Completed     Physical Exam   BP 125/87 (BP 1 Location: Left arm, BP  Patient Position: Sitting) Comment (BP 1 Location): lower arm   Pulse (!) 101    Temp 98 ??F (36.7 ??C) (Oral)    Resp 17    Ht 5\' 2"  (1.575 m)    Wt 132 lb 9.6 oz (60.1 kg)    SpO2 98%    BMI 24.25 kg/m??     Physical Exam  Constitutional:       Appearance: Normal appearance. She is normal weight.      Comments: Mild Pain Distress   HENT:      Head: Normocephalic and atraumatic.      Nose: Nose normal.      Mouth/Throat:      Mouth: Mucous membranes are moist.      Pharynx: Oropharynx is clear.   Eyes:      Extraocular Movements: Extraocular movements intact.      Conjunctiva/sclera: Conjunctivae normal.      Pupils: Pupils are equal, round, and reactive to light.   Skin:     General: Skin is warm and dry.      Findings: No rash.   Neurological:      General: No focal deficit present.      Mental Status: She is alert and oriented to person, place, and time. Mental status is at baseline.      Gait: Gait abnormal.   Psychiatric:         Mood and Affect: Mood normal.         Behavior: Behavior normal.         Thought Content: Thought content normal.         Judgment: Judgment normal.       Left Hip Exam     Tenderness   The patient is experiencing tenderness in the greater trochanter.    Range of Motion   The patient has normal left hip ROM.    Muscle Strength   The patient has normal left hip strength.     Tests   Ober: negative    Other   Erythema: absent  Scars: absent  Sensation: normal  Pulse: present    Comments:  Negative FADIR.           [name], DO

## 2018-09-23 ENCOUNTER — Inpatient Hospital Stay: Admit: 2018-09-23 | Payer: PRIVATE HEALTH INSURANCE | Primary: Nurse Practitioner

## 2018-09-23 DIAGNOSIS — M25552 Pain in left hip: Secondary | ICD-10-CM
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
## 2018-09-23 NOTE — Progress Notes (Signed)
 [facility] PHYSICAL THERAPY  [address], [place], [place] [address] - Phone: [phone]  Fax: [phone]  PLAN OF CARE / STATEMENT OF MEDICAL NECESSITY FOR PHYSICAL THERAPY SERVICES  Patient Name: Barbara [name] DOB: [birth year]   Medical   Diagnosis: Left hip pain [M25.552]  Trochanteric bursitis, left hip [M70.62]  Low back pain [M54.5] Treatment Diagnosis: Left hip pain [M25.552]  Trochanteric bursitis, left hip [M70.62]  Low back pain [M54.5]   Onset Date: Chronic     Referral Source: Claudene Mabel LABOR, DO Start of Care [facility]): 09/23/2018   Prior Hospitalization: See medical history Provider #: [phone]   Prior Level of Function: Independent with ADLs, ambulation; not working; sedentary; walks dogs   Comorbidities: h/o breast CA, s/p double Insurance underwriter; h/o L/S surgery; depression; anxiety; panic disorder; DM; osteoporosis; tobacco use; h/o left frozen shoulder   Medications: Verified on Patient Summary List   The Plan of Care and following information is based on the information from the initial evaluation.   ===========================================================================================  Assessment / key information:  Patient is a 52 y.o. female who presents with complaints of chronic back pain and left hip pain.   Patient demonstrates impaired posture, decreased L/S and B hip ROM/flexibility, decreased core and B hip strength, decreased position/activity tolerance and impaired functional mobility/gait.  Patient would benefit from skilled PT services to address these issues and improve function.  Thank you for this referral.    FOTO: 34/100, stage 3, limited community ambulator  ==========================================================================================  Eval Complexity: History: HIGH Complexity :3+ comorbidities / personal factors will impact the outcome/ POC Exam:HIGH Complexity : 4+ Standardized tests and measures  addressing body structure, function, activity limitation and / or participation in recreation  Presentation: MEDIUM Complexity : Evolving with changing characteristics  Clinical Decision Making:MEDIUM Complexity : FOTO score of 26-74Overall Complexity:MEDIUM    Problem List: pain affecting function, decrease ROM, decrease strength, impaired gait/ balance, decrease ADL/ functional abilitiies, decrease activity tolerance, decrease flexibility/ joint mobility and decrease transfer abilities   Treatment Plan may include any combination of the following: Therapeutic exercise, Therapeutic activities, Neuromuscular re-education, Physical agent/modality, Gait/balance training, Manual therapy, Patient education, Functional mobility training and Stair training  Patient / Family readiness to learn indicated by: asking questions, trying to perform skills and interest  Persons(s) to be included in education: patient (P)  Barriers to Learning/Limitations: None  Measures taken:    Patient Goal (s): Help ease the pain.   Patient self reported health status: fair  Rehabilitation Potential: good  . Short Term Goals: To be accomplished in  2-3  weeks:  1. Patient will demonstrate compliance with HEP.  2. Patient will report less than or equal to 4/10 pain at worst to allow increased activity tolerance.  . Long Term Goals: To be accomplished in  4-6  weeks:  1. Patient will demonstrate independence with HEP.  2. Patient will demonstrate 5/5 B hip abduction strength to facilitate functional mobility/gait.  3. Patient will score greater than or equal to 48/100 on FOTO, placing her in stage 4, independent community ambulator.  Frequency / Duration:   Patient to be seen  2  times per week for 4-6  weeks:  Patient / Caregiver education and instruction: activity modification and exercises    Therapist Signature: Sharyle Pattee, PT Date: 09/23/2018   Certification Period: NA Time: 11:53 AM    ===========================================================================================  I certify that the above Physical Therapy Services are being furnished while  the patient is under my care.  I agree with the treatment plan and certify that this therapy is necessary.    Physician Signature:        Date:       Time:     Please sign and return to [facility] or you may fax the signed copy to [phone].  Thank you.

## 2018-09-23 NOTE — Progress Notes (Signed)
[place] ??? [facility] PHYSICAL THERAPY  [address], [place] [address] - Phone: [phone]  Fax: [phone]  PLAN OF CARE / [place]  Patient Name: Barbara [name] DOB: Sep 28, 1966   Medical   Diagnosis: Left hip pain [M25.552]  Trochanteric bursitis, left hip [M70.62]  Low back pain [M54.5] Treatment Diagnosis: Left hip pain [M25.552]  Trochanteric bursitis, left hip [M70.62]  Low back pain [M54.5]   Onset Date: Chronic     Referral Source: [name], DO [facility]): 09/23/2018   Prior Hospitalization: See medical history Provider #: [phone]   Prior Level of Function: Independent with ADLs, ambulation; not working; sedentary; walks dogs   Comorbidities: h/o breast CA, s/p double Office manager; h/o L/S surgery; depression; anxiety; panic disorder; DM; osteoporosis; tobacco use; h/o left frozen shoulder   Medications: Verified on Patient Summary List   The Plan of Care and following information is based on the information from the initial evaluation.   ===========================================================================================  Assessment / key information:  Patient is a 52 y.o. female who presents with complaints of chronic back pain and left hip pain.   Patient demonstrates impaired posture, decreased L/S and B hip ROM/flexibility, decreased core and B hip strength, decreased position/activity tolerance and impaired functional mobility/gait.  Patient would benefit from skilled PT services to address these issues and improve function.  Thank you for this referral.    FOTO: 34/100, stage 3, limited community ambulator  ==========================================================================================  Eval Complexity: History: HIGH Complexity :3+ comorbidities / personal factors will impact the outcome/ POC Exam:HIGH Complexity : 4+  Standardized tests and measures addressing body structure, function, activity limitation and / or participation in recreation  Presentation: MEDIUM Complexity : Evolving with changing characteristics  Clinical Decision Making:MEDIUM Complexity : FOTO score of 26-74Overall Complexity:MEDIUM    Problem List: pain affecting function, decrease ROM, decrease strength, impaired gait/ balance, decrease ADL/ functional abilitiies, decrease activity tolerance, decrease flexibility/ joint mobility and decrease transfer abilities   Treatment Plan may include any combination of the following: Therapeutic exercise, Therapeutic activities, Neuromuscular re-education, Physical agent/modality, Gait/balance training, Manual therapy, Patient education, Functional mobility training and Stair training  Patient / Family readiness to learn indicated by: asking questions, trying to perform skills and interest  Persons(s) to be included in education: patient (P)  Barriers to Learning/Limitations: None  Measures taken:    Patient Goal (s): "Help ease the pain."   Patient self reported health status: fair  Rehabilitation Potential: good  ? Short Term Goals: To be accomplished in  2-3  weeks:  1. Patient will demonstrate compliance with HEP.  2. Patient will report less than or equal to 4/10 pain at worst to allow increased activity tolerance.  ? Long Term Goals: To be accomplished in  4-6  weeks:  1. Patient will demonstrate independence with HEP.  2. Patient will demonstrate 5/5 B hip abduction strength to facilitate functional mobility/gait.  3. Patient will score greater than or equal to 48/100 on FOTO, placing her in stage 4, independent community ambulator.  Frequency / Duration:   Patient to be seen  2  times per week for 4-6  weeks:  Patient / Caregiver education and instruction: activity modification and exercises    Therapist Signature: Bjorn Pippin, PT Date: 16/10/930   Certification Period: NA Time: 11:53 AM    ===========================================================================================  I certify that the above Physical Therapy Services are being furnished while  the patient is under my care.  I agree with the treatment plan and certify that this therapy is necessary.    Physician Signature:        Date:       Time:     Please sign and return to [facility] or you may fax the signed copy to [phone].  Thank you.
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
## 2018-09-25 ENCOUNTER — Inpatient Hospital Stay: Payer: PRIVATE HEALTH INSURANCE | Primary: Nurse Practitioner

## 2018-09-28 ENCOUNTER — Inpatient Hospital Stay: Admit: 2018-09-28 | Payer: PRIVATE HEALTH INSURANCE | Primary: Nurse Practitioner

## 2018-09-28 NOTE — Progress Notes (Signed)
 PHYSICAL THERAPY - DAILY TREATMENT NOTE    Patient Name: Barbara [name]        Date: 09/28/2018  DOB: 05-19-1966   yes Patient DOB Verified  Visit #:   2   of   8-12  Insurance: Payor: OPTIMA / [place]: [place] [employer] / Product Type: HMO /      In time: 810 Out time: 859   Total Treatment Time: 49     TREATMENT AREA =  Left hip pain [M25.552]  Trochanteric bursitis, left hip [M70.62]  Low back pain [M54.5]    SUBJECTIVE  Pain Level (on 0 to 10 scale):  6  / 10   Medication Changes/New allergies or changes in medical history, any new surgeries or procedures?    no  If yes, update Summary List   Subjective Functional Status/Changes:  []   No changes reported     I am sore today from the exercises and from walking          OBJECTIVE  Modalities Rationale:     decrease pain to improve patient's ability to  safely perform ADLs/transfers/squatting/prolong stding and ambulation/stair negotiation with minimal or no c/o pain   min []  Estim, type/location:                                      []   att     []   unatt     []   w/US      []   w/ice    []   w/heat    min []   Mechanical Traction: type/lbs                   []   pro   []   sup   []   int   []   cont    []   before manual    []   after manual    min []   Ultrasound, settings/location:      min []   Iontophoresis w/ dexamethasone, location:                                               []   take home patch       []   in clinic   10 min []   Ice     [x]   Heat    location/position: in right SLing with pillow between  LEs    min []   Vasopneumatic Device, press/temp:     min []   Other:    [x]  Skin assessment post-treatment (if applicable):    [x]   intact    []   redness- no adverse reaction     [] redness - adverse reaction:        30 min Therapeutic Exercise:  [x]   See flow sheet   Rationale:      increase ROM, increase strength and improve coordination to improve the patient's ability to safely perform ADLs/ bending/stooping/ lifting/prolong sitting, stding and amb/ stairs with minimal to  no pain       9 min Therapeutic Activity: [x]   See flow sheet  postural/bed mobility training  sit <>std without UE   Rationale:    increase ROM and increase strength to improve the patient's ability to  safely perform ADLs/transfers/squatting/prolong stding and ambulation/stair negotiation with minimal or no c/o pain  Billed With/As:   [x]  TE   []  TA   []  Neuro   []  Self Care Patient Education: [x]  Review HEP    []  Progressed/Changed HEP based on:   [x]  positioning   [x]  body mechanics   [x]  transfers   []  heat/ice application    [x]  other: Pt ed on importance and benefits of compliance with HEP, core strength/stability and proper posture; pt verbalized understanding       Other Objective/Functional Measures:  VCs + demo to perform proper technique for TE  c/o LE fatigue after Nu-step, and c/o right hip pain with LAQs  Reviewed proper bed mobility, sleeping positions, and importance and benefits of a neutral spine  Initiated TE per flowsheet without p! > 5/10    Post Treatment Pain Level (on 0 to 10) scale:   4-5  / 10     ASSESSMENT  Assessment/Changes in Function:   demos decrease core/L/s strength in sitting position, able to perform improved posture with VCs  demos difficulty and decrease speed for sup>sit      []   See Progress Note/Recertification   Patient will continue to benefit from skilled PT services to modify and progress therapeutic interventions, address functional mobility deficits, address ROM deficits, address strength deficits, analyze and address soft tissue restrictions, analyze and cue movement patterns, analyze and modify body mechanics/ergonomics, assess and modify postural abnormalities and instruct in home and community integration to attain remaining goals.   Progress toward goals / Updated goals:  Pt's first visit since IE, no noted progress   Short Term Goals: To be accomplished in  2-3  weeks:  1. Patient will demonstrate compliance with HEP.  2. Patient will report less than or  equal to 4/10 pain at worst to allow increased activity tolerance.   Long Term Goals: To be accomplished in  4-6  weeks:  1. Patient will demonstrate independence with HEP.  2. Patient will demonstrate 5/5 B hip abduction strength to facilitate functional mobility/gait.  3. Patient will score greater than or equal to 48/100 on FOTO, placing her in stage 4, independent community ambulator.     PLAN  []   Upgrade activities as tolerated yes Continue plan of care   []   Discharge due to :    []   Other:      Therapist: Caprice Harries, PTA    Date: 09/28/2018 Time: 8:22 AM     Future Appointments   Date Time Provider Department Center   09/30/2018  9:00 AM Humphrey Lonni DEL, PT [facility]   10/09/2018  9:00 AM Humphrey Lonni DEL, PT [facility]   10/12/2018  9:00 AM [facility] PT [address] [facility]

## 2018-09-28 NOTE — Progress Notes (Signed)
PHYSICAL THERAPY - DAILY TREATMENT NOTE    Patient Name: Barbara [name]        Date: 09/28/2018  DOB: September 17, 1966   yes Patient DOB Verified  Visit #:   2   of   8-12  Insurance: Payor: OPTIMA / [place]: [place] H[place] / Product Type: HMO /      In time: 810 Out time: 865   Total Treatment Time: 49     TREATMENT AREA =  Left hip pain [M25.552]  Trochanteric bursitis, left hip [M70.62]  Low back pain [M54.5]    SUBJECTIVE  Pain Level (on 0 to 10 scale):  6  / 10   Medication Changes/New allergies or changes in medical history, any new surgeries or procedures?    no  If yes, update Summary List   Subjective Functional Status/Changes:  []   No changes reported     "I am sore today from the exercises and from walking"          OBJECTIVE  Modalities Rationale:     decrease pain to improve patient's ability to  safely perform ADLs/transfers/squatting/prolong stding and ambulation/stair negotiation with minimal or no c/o pain   min []  Estim, type/location:                                      []   att     []   unatt     []   w/US     []   w/ice    []   w/heat    min []   Mechanical Traction: type/lbs                   []   pro   []   sup   []   int   []   cont    []   before manual    []   after manual    min []   Ultrasound, settings/location:      min []   Iontophoresis w/ dexamethasone, location:                                               []   take home patch       []   in clinic   10 min []   Ice     [x]   Heat    location/position: in right SLing with pillow between  LEs    min []   Vasopneumatic Device, press/temp:     min []   Other:    [x]  Skin assessment post-treatment (if applicable):    [x]   intact    []   redness- no adverse reaction     [] redness ??? adverse reaction:        30 min Therapeutic Exercise:  [x]   See flow sheet   Rationale:      increase ROM, increase strength and improve coordination to improve the patient???s ability to safely perform ADLs/ bending/stooping/  lifting/prolong sitting, stding and amb/ stairs with minimal to no pain       9 min Therapeutic Activity: [x]   See flow sheet  postural/bed mobility training  sit <>std without UE   Rationale:    increase ROM and increase strength to improve the patient???s ability to  safely perform ADLs/transfers/squatting/prolong stding and ambulation/stair negotiation with minimal or no c/o pain  Billed With/As:   [x]  TE   []  TA   []  Neuro   []  Self Care Patient Education: [x]  Review HEP    []  Progressed/Changed HEP based on:   [x]  positioning   [x]  body mechanics   [x]  transfers   []  heat/ice application    [x]  other: Pt ed on importance and benefits of compliance with HEP, core strength/stability and proper posture; pt verbalized understanding       Other Objective/Functional Measures:  VCs + demo to perform proper technique for TE  c/o LE fatigue after Nu-step, and c/o right hip pain with LAQs  Reviewed proper bed mobility, sleeping positions, and importance and benefits of a neutral spine  Initiated TE per flowsheet without p! > 5/10    Post Treatment Pain Level (on 0 to 10) scale:   4-5  / 10     ASSESSMENT  Assessment/Changes in Function:   demos decrease core/L/s strength in sitting position, able to perform improved posture with VCs  demos difficulty and decrease speed for sup>sit      []   See Progress Note/Recertification   Patient will continue to benefit from skilled PT services to modify and progress therapeutic interventions, address functional mobility deficits, address ROM deficits, address strength deficits, analyze and address soft tissue restrictions, analyze and cue movement patterns, analyze and modify body mechanics/ergonomics, assess and modify postural abnormalities and instruct in home and community integration to attain remaining goals.   Progress toward goals / Updated goals:  Pt's first visit since IE, no noted progress   Short Term Goals: To be accomplished in  2-3  weeks:   1. Patient will demonstrate compliance with HEP.  2. Patient will report less than or equal to 4/10 pain at worst to allow increased activity tolerance.  ?? Long Term Goals: To be accomplished in  4-6  weeks:  1. Patient will demonstrate independence with HEP.  2. Patient will demonstrate 5/5 B hip abduction strength to facilitate functional mobility/gait.  3. Patient will score greater than or equal to 48/100 on FOTO, placing her in stage 4, independent community ambulator.     PLAN  []   Upgrade activities as tolerated yes Continue plan of care   []   Discharge due to :    []   Other:      Therapist: Margot Chimes, PTA    Date: 09/28/2018 Time: 8:22 AM     Future Appointments   Date Time Provider [place]   09/30/2018  9:00 AM Nelida Meuse, PT [facility]   10/09/2018  9:00 AM Nelida Meuse, PT [facility]   10/12/2018  9:00 AM [place] PT [place] [facility] [place]

## 2018-09-30 ENCOUNTER — Inpatient Hospital Stay: Admit: 2018-09-30 | Payer: PRIVATE HEALTH INSURANCE | Primary: Nurse Practitioner

## 2018-09-30 DIAGNOSIS — M7062 Trochanteric bursitis, left hip: Secondary | ICD-10-CM

## 2018-09-30 NOTE — Progress Notes (Signed)
PHYSICAL THERAPY - DAILY TREATMENT NOTE    Patient Name: Barbara [name]        Date: 09/30/2018  DOB: 1966-02-12   YES Patient DOB Verified  Visit #:   3   of   8-12  Insurance: Payor: OPTIMA / Plan: [place] [employer] H[place] / Product Type: HMO /      In time: 09:00 Out time: 09:38   Total Treatment Time: 38     Medicare/BCBS Time Tracking (below)   Total Timed Codes (min):  NA 1:1 Treatment Time:  NA     TREATMENT AREA = Left hip pain [M25.552]  Trochanteric bursitis, left hip [M70.62]  Low back pain [M54.5]    SUBJECTIVE    Pain Level (on 0 to 10 scale):  5  / 10   Medication Changes/New allergies or changes in medical history, any new surgeries or procedures?    NO    If yes, update Summary List   Subjective Functional Status/Changes:  []   No changes reported     "I was sore after last session. "          OBJECTIVE    Therapeutic Procedures:  Min Procedure Specifics + Rationale   n/a [x]   Patient Education (performed throughout session) [x]  Review HEP    []  Progressed/Changed HEP based on:   []  proper performance and advancement of Therex/TA   []  reduction in pain level    []  increased functional capacity       []  change in directional preference   38 [x]  Therapeutic Exercise   [x]   See Flowsheet   Rationale: increase ROM and increase strength to improve the patient's ability to participate in ADL's          Other Objective/Functional Measures:    PT added H/L TA, hip 3-way and increased reps/sets/resistance as noted on the flow sheet.    Pt and pt discussed sleeping in supine position rather than in recliner.    Pt declined heat post session d/t causing hot flash last session.   See flow sheet for more details.    Progressed therex per flow sheet.   Post Treatment Pain Level (on 0 to 10) scale:   4  / 10     ASSESSMENT    Assessment/Changes in Function:     Pt is going to be out of town for a week and has demonstrated and reported compliance with HEP. Pt instructed pt to perform HEP daily.    []   See Plan of Care  []   See  Progress Note/ Recertification  []   See Discharge Summary        Patient will continue to benefit from skilled PT services to modify and progress therapeutic interventions, address functional mobility deficits, address ROM deficits, address strength deficits, analyze and address soft tissue restrictions, analyze and cue movement patterns, analyze and modify body mechanics/ergonomics, assess and modify postural abnormalities, address imbalance/dizziness and instruct in home and community integration  to attain remaining goals   Progress toward goals / Updated goals:     Short Term Goals: To be accomplished in  2-3  weeks:  1. Patient will demonstrate compliance with HEP. MET, pt reports compliance  2. Patient will report less than or equal to 4/10 pain at worst to allow increased activity tolerance.   Long Term Goals: To be accomplished in  4-6  weeks:  1. Patient will demonstrate independence with HEP.  2. Patient will demonstrate 5/5 B hip abduction strength to  facilitate functional mobility/gait.  3. Patient will score greater than or equal to 48/100 on FOTO, placing her in stage 4, independent community ambulator.     PLAN    [x]   Upgrade activities as tolerated  [x]   Update interventions per flow sheet YES Continue plan of care   []   Discharge due to :    []   Other:      Therapist: Annye Asa, DPT    Date: 09/30/2018 Time: 8:51 AM     Future Appointments   Date Time Provider Department Center   09/30/2018  9:00 AM Alfred Levins, PT [facility]   10/09/2018  9:00 AM Alfred Levins, PT [place]   10/12/2018  9:00 AM [facility] PT [address] [facility]   10/19/2018 10:45 AM Jolee Ewing, DO [facility] SCHED

## 2018-09-30 NOTE — Progress Notes (Signed)
PHYSICAL THERAPY - DAILY TREATMENT NOTE    Patient Name: Barbara [name]        Date: 09/30/2018  DOB: [birth year]   YES Patient DOB Verified  Visit #:   3   of   8-12  Insurance: Payor: OPTIMA / Plan: [place] OPT[employer] HMO / Product Type: HMO /      In time: 09:00 Out time: 09:38   Total Treatment Time: 38     Medicare/BCBS Time Tracking (below)   Total Timed Codes (min):  NA 1:1 Treatment Time:  NA     TREATMENT AREA = Left hip pain [M25.552]  Trochanteric bursitis, left hip [M70.62]  Low back pain [M54.5]    SUBJECTIVE    Pain Level (on 0 to 10 scale):  5  / 10   Medication Changes/New allergies or changes in medical history, any new surgeries or procedures?    NO    If yes, update Summary List   Subjective Functional Status/Changes:  '[]'$   No changes reported     "I was sore after last session. "          OBJECTIVE    Therapeutic Procedures:  Min Procedure Specifics + Rationale   n/a '[x]'$   Patient Education (performed throughout session) '[x]'$  Review HEP    '[]'$  Progressed/Changed HEP based on:   '[]'$  proper performance and advancement of Therex/TA   '[]'$  reduction in pain level    '[]'$  increased functional capacity       '[]'$  change in directional preference   38 '[x]'$  Therapeutic Exercise   '[x]'$   See Flowsheet   Rationale: increase ROM and increase strength to improve the patient???s ability to participate in ADL's          Other Objective/Functional Measures:    PT added H/L TA, hip 3-way and increased reps/sets/resistance as noted on the flow sheet.    Pt and pt discussed sleeping in supine position rather than in recliner.    Pt declined heat post session d/t causing hot flash last session.   See flow sheet for more details.    Progressed therex per flow sheet.   Post Treatment Pain Level (on 0 to 10) scale:   4  / 10     ASSESSMENT    Assessment/Changes in Function:     Pt is going to be out of town for a week and has demonstrated and reported compliance with HEP. Pt instructed pt to perform HEP daily.    '[]'$   See Plan of Care   '[]'$   See Progress Note/ Recertification  '[]'$   See Discharge Summary        Patient will continue to benefit from skilled PT services to modify and progress therapeutic interventions, address functional mobility deficits, address ROM deficits, address strength deficits, analyze and address soft tissue restrictions, analyze and cue movement patterns, analyze and modify body mechanics/ergonomics, assess and modify postural abnormalities, address imbalance/dizziness and instruct in home and community integration  to attain remaining goals   Progress toward goals / Updated goals:    ?? Short Term Goals: To be accomplished in  2-3  weeks:  1. Patient will demonstrate compliance with HEP. MET, pt reports compliance  2. Patient will report less than or equal to 4/10 pain at worst to allow increased activity tolerance.  ?? Long Term Goals: To be accomplished in  4-6  weeks:  1. Patient will demonstrate independence with HEP.  2. Patient will demonstrate 5/5 B hip abduction strength to  facilitate functional mobility/gait.  3. Patient will score greater than or equal to 48/100 on FOTO, placing her in stage 4, independent community ambulator.     PLAN    [x]   Upgrade activities as tolerated  [x]   Update interventions per flow sheet YES Continue plan of care   []   Discharge due to :    []   Other:      Therapist: Kennith Center, DPT    Date: 09/30/2018 Time: 8:51 AM     Future Appointments   Date Time Provider [place]   09/30/2018  9:00 AM Nelida Meuse, PT [facility]   10/09/2018  9:00 AM Nelida Meuse, PT [facility]   10/12/2018  9:00 AM [place] [address] [place] [facility]   10/19/2018 10:45 AM [name], DO [place]

## 2018-10-01 ENCOUNTER — Encounter

## 2018-10-01 NOTE — Telephone Encounter (Signed)
Called patient to inquire if she was able schedule appointment with pain management. Notified patent that she was contacted and also gave her the contact information to [place] [phone] ph [phone] so that she could schedule her appointment as well. Resent referral to office

## 2018-10-01 NOTE — Telephone Encounter (Signed)
Requested Prescriptions     Pending Prescriptions Disp Refills   ??? LORazepam (ATIVAN) 0.5 mg tablet 20 Tab 0     Sig: Take 1 Tab by mouth every four (4) hours as needed for Anxiety. Max Daily Amount: 3 mg.

## 2018-10-02 MED ORDER — LORAZEPAM 0.5 MG TAB
0.5 mg | ORAL_TABLET | ORAL | 0 refills | Status: DC | PRN
Start: 2018-10-02 — End: 2018-10-23

## 2018-10-04 NOTE — Telephone Encounter (Signed)
Please let Mrs. Donaghy know that LEFT Hip XR WNL, suspect Pain secondary to Lumbar Disc Disease as shown on MRI Lumbar Spine 07/08/2018. Continue with PT and Follow Up in Clinic once complete. May consider Injection Therapy with Pain clinic IF NOT helpful.

## 2018-10-06 NOTE — Telephone Encounter (Signed)
Called patient to let her that LEFT Hip XR WNL, suspect Pain secondary to Lumbar Disc Disease as shown on MRI Lumbar Spine 07/08/2018. Continue with PT and Follow Up in Clinic once complete. May consider Injection Therapy with Pain clinic IF NOT helpful. Patient was thankful for call had no other questions at this time closing encounter

## 2018-10-09 ENCOUNTER — Inpatient Hospital Stay: Payer: PRIVATE HEALTH INSURANCE | Primary: Nurse Practitioner

## 2018-10-12 ENCOUNTER — Inpatient Hospital Stay: Admit: 2018-10-12 | Payer: PRIVATE HEALTH INSURANCE | Primary: Nurse Practitioner

## 2018-10-12 NOTE — Progress Notes (Signed)
PHYSICAL THERAPY - DAILY TREATMENT NOTE    Patient Name: Barbara [name]        Date: 10/12/2018  DOB: 09-16-1966   yes Patient DOB Verified  Visit #:   4 of   8-12  Insurance: Payor: OPTIMA / Plan: [place] OPT[employer]A HMO / Product Type: HMO /      In time: 910 Out time: 945   Total Treatment Time: 45     TREATMENT AREA =  Left hip pain [M25.552]  Trochanteric bursitis, left hip [M70.62]  Low back pain [M54.5]    SUBJECTIVE  Pain Level (on 0 to 10 scale):  5  / 10   Medication Changes/New allergies or changes in medical history, any new surgeries or procedures?    no  If yes, update Summary List   Subjective Functional Status/Changes:  []   No changes reported     "I feel it in my lower back. I traveled so much over [place], I am still feeling it"         OBJECTIVE      36 min Therapeutic Exercise:  [x]   See flow sheet   Rationale:      increase ROM, increase strength and improve coordination to improve the patient's ability to safely perform ADLs/ bending/stooping/ lifting/prolong sitting, stding and amb/ stairs with minimal to no pain       9 min Therapeutic Activity: [x]   See flow sheet  postural/bed mobility training  sit <>std without UE   Rationale:    increase ROM and increase strength to improve the patient's ability to  safely perform ADLs/transfers/squatting/prolong stding and ambulation/stair negotiation with minimal or no c/o pain          Billed With/As:   [x]  TE   []  TA   []  Neuro   []  Self Care Patient Education: [x]  Review HEP    []  Progressed/Changed HEP based on:   [x]  positioning   [x]  body mechanics   [x]  transfers   []  heat/ice application    [x]  other: Pt ed on importance and benefits of compliance with HEP, core strength/stability and proper posture; pt verbalized understanding       Other Objective/Functional Measures:  VCs + demo to perform proper technique for TE  performed HL hip abd unilateral without difficulty  c/o p! in supine, declined fowlers position  unable to perform standing hip ext due  to 6/10 L/s p! after 2 reps, declined cp or MHP to manage pain     Post Treatment Pain Level (on 0 to 10) scale:   5-6  / 10     ASSESSMENT  Assessment/Changes in Function:   continues to lack core strength in sitting CCs for upright posture and decrease fwd head     []   See Progress Note/Recertification   Patient will continue to benefit from skilled PT services to modify and progress therapeutic interventions, address functional mobility deficits, address ROM deficits, address strength deficits, analyze and address soft tissue restrictions, analyze and cue movement patterns, analyze and modify body mechanics/ergonomics, assess and modify postural abnormalities and instruct in home and community integration to attain remaining goals.   Progress toward goals / Updated goals:  Short Term Goals: To be accomplished in  2-3  weeks:  1. Patient will demonstrate compliance with HEP.  2. Patient will report less than or equal to 4/10 pain at worst to allow increased activity tolerance.   Long Term Goals: To be accomplished in  4-6  weeks:  1. Patient will demonstrate independence with HEP.  2. Patient will demonstrate 5/5 B hip abduction strength to facilitate functional mobility/gait.  3. Patient will score greater than or equal to 48/100 on FOTO, placing her in stage 4, independent community ambulator.     PLAN  []   Upgrade activities as tolerated yes Continue plan of care   []   Discharge due to :    []   Other:      Therapist: Drue Stager, PTA    Date: 10/12/2018 Time: 12:14 PM     Future Appointments   Date Time Provider Department Center   10/16/2018 11:00 AM Sherlyn Lick, PT [facility]   10/19/2018 10:45 AM Jolee Ewing, DO [facility] SCHED   10/19/2018  3:00 PM Alfred Levins, PT [facility]   10/22/2018 11:30 AM [facility] Levins, PT [facility]   10/26/2018 10:30 AM Sherlyn Lick, PT [place] [facility]   10/28/2018 11:00 AM Alfred Levins, PT [facility]   11/02/2018 11:00 AM Arlis Porta  [facility]   11/04/2018 11:00 AM Alfred Levins, PT [facility]   11/09/2018 10:30 AM Alfred Levins, PT [facility]   11/11/2018 11:00 AM Alfred Levins, PT [facility]

## 2018-10-12 NOTE — Progress Notes (Signed)
PHYSICAL THERAPY - DAILY TREATMENT NOTE    Patient Name: Barbara [name]        Date: 10/12/2018  DOB: 07-11-1966   yes Patient DOB Verified  Visit #:   4 of   8-12  Insurance: Payor: OPTIMA / Plan: [place] OPT[employer]A HMO / Product Type: HMO /      In time: 910 Out time: 945   Total Treatment Time: 45     TREATMENT AREA =  Left hip pain [M25.552]  Trochanteric bursitis, left hip [M70.62]  Low back pain [M54.5]    SUBJECTIVE  Pain Level (on 0 to 10 scale):  5  / 10   Medication Changes/New allergies or changes in medical history, any new surgeries or procedures?    no  If yes, update Summary List   Subjective Functional Status/Changes:  []   No changes reported     "I feel it in my lower back. I traveled so much over [place], I am still feeling it"         OBJECTIVE      36 min Therapeutic Exercise:  [x]   See flow sheet   Rationale:      increase ROM, increase strength and improve coordination to improve the patient???s ability to safely perform ADLs/ bending/stooping/ lifting/prolong sitting, stding and amb/ stairs with minimal to no pain       9 min Therapeutic Activity: [x]   See flow sheet  postural/bed mobility training  sit <>std without UE   Rationale:    increase ROM and increase strength to improve the patient???s ability to  safely perform ADLs/transfers/squatting/prolong stding and ambulation/stair negotiation with minimal or no c/o pain          Billed With/As:   [x]  TE   []  TA   []  Neuro   []  Self Care Patient Education: [x]  Review HEP    []  Progressed/Changed HEP based on:   [x]  positioning   [x]  body mechanics   [x]  transfers   []  heat/ice application    [x]  other: Pt ed on importance and benefits of compliance with HEP, core strength/stability and proper posture; pt verbalized understanding       Other Objective/Functional Measures:  VCs + demo to perform proper technique for TE  performed HL hip abd unilateral without difficulty  c/o p! in supine, declined fowlers position   unable to perform standing hip ext due to 6/10 L/s p! after 2 reps, declined cp or MHP to manage pain     Post Treatment Pain Level (on 0 to 10) scale:   5-6  / 10     ASSESSMENT  Assessment/Changes in Function:   continues to lack core strength in sitting CCs for upright posture and decrease fwd head     []   See Progress Note/Recertification   Patient will continue to benefit from skilled PT services to modify and progress therapeutic interventions, address functional mobility deficits, address ROM deficits, address strength deficits, analyze and address soft tissue restrictions, analyze and cue movement patterns, analyze and modify body mechanics/ergonomics, assess and modify postural abnormalities and instruct in home and community integration to attain remaining goals.   Progress toward goals / Updated goals:  Short Term Goals: To be accomplished in  2-3  weeks:  1. Patient will demonstrate compliance with HEP.  2. Patient will report less than or equal to 4/10 pain at worst to allow increased activity tolerance.  ?? Long Term Goals: To be accomplished in  4-6  weeks:  1. Patient will demonstrate independence with HEP.  2. Patient will demonstrate 5/5 B hip abduction strength to facilitate functional mobility/gait.  3. Patient will score greater than or equal to 48/100 on FOTO, placing her in stage 4, independent community ambulator.     PLAN  []   Upgrade activities as tolerated yes Continue plan of care   []   Discharge due to :    []   Other:      Therapist: Margot Chimes, PTA    Date: 10/12/2018 Time: 12:14 PM     Future Appointments   Date Time Provider [place]   10/16/2018 11:00 AM Bjorn Pippin, PT [facility]   10/19/2018 10:45 AM [name], DO [facility] SCHED   10/19/2018  3:00 PM Nelida Meuse, PT [facility]   10/22/2018 11:30 AM Nelida Meuse, PT [facility]   10/26/2018 10:30 AM Bjorn Pippin, PT [facility]   10/28/2018 11:00 AM Nelida Meuse, PT [facility]    11/02/2018 11:00 AM Leata Mouse [facility]   11/04/2018 11:00 AM Nelida Meuse, PT [place] [facility]   11/09/2018 10:30 AM Nelida Meuse, PT [facility]   11/11/2018 11:00 AM Nelida Meuse, PT [facility]

## 2018-10-16 ENCOUNTER — Encounter: Payer: PRIVATE HEALTH INSURANCE | Primary: Nurse Practitioner

## 2018-10-19 ENCOUNTER — Inpatient Hospital Stay: Admit: 2018-10-19 | Payer: PRIVATE HEALTH INSURANCE | Primary: Nurse Practitioner

## 2018-10-19 ENCOUNTER — Encounter: Attending: Sports Medicine | Primary: Nurse Practitioner

## 2018-10-19 DIAGNOSIS — M25552 Pain in left hip: Secondary | ICD-10-CM

## 2018-10-19 NOTE — Progress Notes (Signed)
 PHYSICAL THERAPY - DAILY TREATMENT NOTE    Patient Name: Barbara [name]        Date: 10/19/2018  DOB: 1966-06-14   YES Patient DOB Verified  Visit #:   5   of   8-12  Insurance: Payor: OPTIMA / Plan: [place] OPT[employer]A HMO / Product Type: HMO /      In time: 03:06 Out time: 2:36   Total Treatment Time: 30     Medicare/BCBS Time Tracking (below)   Total Timed Codes (min):  NA 1:1 Treatment Time:  NA     TREATMENT AREA = Left hip pain [M25.552]  Trochanteric bursitis, left hip [M70.62]  Low back pain [M54.5]    SUBJECTIVE    Pain Level (on 0 to 10 scale):  5  / 10   Medication Changes/New allergies or changes in medical history, any new surgeries or procedures?    NO    If yes, update Summary List   Subjective Functional Status/Changes:  []   No changes reported     I feel my pain is about the same.          OBJECTIVE    Therapeutic Procedures:  Min Procedure Specifics + Rationale   n/a [x]   Patient Education (performed throughout session) [x]  Review HEP    []  Progressed/Changed HEP based on:   []  proper performance and advancement of Therex/TA   []  reduction in pain level    []  increased functional capacity       []  change in directional preference   30 [x]  Therapeutic Exercise   [x]   See Flowsheet   Rationale: increase ROM and increase strength to improve the patient's ability to participate in ADL's      Other Objective/Functional Measures:    PT added sidestepping, pallof press, bridges, H/L isometric hip flexion and increased reps/sets/resistance as noted on the flow sheet.     Limited functional progress reported  See flow sheet for more details.    Progressed therex per flow sheet.   Post Treatment Pain Level (on 0 to 10) scale:   5  / 10     ASSESSMENT    Assessment/Changes in Function:     Pt continues to require cueing for posture throughout session. PT and pt discussed possible D/C NV.   []   See Plan of Care  []   See Progress Note/ Recertification  []   See Discharge Summary        Patient will continue to benefit  from skilled PT services to modify and progress therapeutic interventions, address functional mobility deficits, address ROM deficits, address strength deficits, analyze and address soft tissue restrictions, analyze and cue movement patterns, analyze and modify body mechanics/ergonomics, assess and modify postural abnormalities, address imbalance/dizziness and instruct in home and community integration  to attain remaining goals   Progress toward goals / Updated goals:    Short Term Goals: To be accomplished in2-3weeks:  1. Patient will demonstrate compliance with HEP. MET, pt reports compliance.   2. Patient will report less than or equal to 4/10 pain at worst to allow increased activity tolerance. Progressing, Pt continues to to report 5/10 L/S pain upon arriving.    Long Term Goals: To be accomplished in4-6weeks:  1. Patient will demonstrate independence with HEP.  2. Patient will demonstrate 5/5 B hip abduction strength to facilitate functional mobility/gait.  3. Patient will score greater than or equal to 48/100 on FOTO, placing her in stage 4, independent community ambulator.  PLAN    [x]   Upgrade activities as tolerated  [x]   Update interventions per flow sheet YES Continue plan of care   []   Discharge due to :    []   Other:      Therapist: Medford Sanfilippo, DPT    Date: 10/19/2018 Time: 10:10 AM     Future Appointments   Date Time Provider Department Center   10/19/2018 10:45 AM Claudene Mabel LABOR, DO [facility] SCHED   10/19/2018  3:00 PM Sanfilippo Lonni DEL, PT [facility]   10/22/2018 11:30 AM Sanfilippo Lonni DEL, PT [facility]   10/26/2018 10:30 AM Ronette Fend, PT [facility]   10/28/2018 11:00 AM Sanfilippo Lonni DEL, PT [facility]   11/02/2018 11:00 AM Jannette Caprice [facility]   11/04/2018 11:00 AM Sanfilippo Lonni DEL, PT [facility]   11/09/2018 10:30 AM Sanfilippo Lonni DEL, PT [facility]   11/11/2018 11:00 AM Sanfilippo Lonni DEL, PT [facility]

## 2018-10-19 NOTE — Progress Notes (Signed)
PHYSICAL THERAPY - DAILY TREATMENT NOTE    Patient Name: Barbara [name]        Date: 10/19/2018  DOB: July 19, 1966   YES Patient DOB Verified  Visit #:   5   of   8-12  Insurance: Payor: [employer] / Plan: [place] HMO / Product Type: HMO /      In time: 03:06 Out time: 2:36   Total Treatment Time: 30     Medicare/BCBS Time Tracking (below)   Total Timed Codes (min):  NA 1:1 Treatment Time:  NA     TREATMENT AREA = Left hip pain [M25.552]  Trochanteric bursitis, left hip [M70.62]  Low back pain [M54.5]    SUBJECTIVE    Pain Level (on 0 to 10 scale):  5  / 10   Medication Changes/New allergies or changes in medical history, any new surgeries or procedures?    NO    If yes, update Summary List   Subjective Functional Status/Changes:  '[]'$   No changes reported     "I feel my pain is about the same."          OBJECTIVE    Therapeutic Procedures:  Min Procedure Specifics + Rationale   n/a '[x]'$   Patient Education (performed throughout session) '[x]'$  Review HEP    '[]'$  Progressed/Changed HEP based on:   '[]'$  proper performance and advancement of Therex/TA   '[]'$  reduction in pain level    '[]'$  increased functional capacity       '[]'$  change in directional preference   30 '[x]'$  Therapeutic Exercise   '[x]'$   See Flowsheet   Rationale: increase ROM and increase strength to improve the patient???s ability to participate in ADL's      Other Objective/Functional Measures:    PT added sidestepping, pallof press, bridges, H/L isometric hip flexion and increased reps/sets/resistance as noted on the flow sheet.     Limited functional progress reported  See flow sheet for more details.    Progressed therex per flow sheet.   Post Treatment Pain Level (on 0 to 10) scale:   5  / 10     ASSESSMENT    Assessment/Changes in Function:     Pt continues to require cueing for posture throughout session. PT and pt discussed possible D/C NV.   '[]'$   See Plan of Care  '[]'$   See Progress Note/ Recertification  '[]'$   See Discharge Summary         Patient will continue to benefit from skilled PT services to modify and progress therapeutic interventions, address functional mobility deficits, address ROM deficits, address strength deficits, analyze and address soft tissue restrictions, analyze and cue movement patterns, analyze and modify body mechanics/ergonomics, assess and modify postural abnormalities, address imbalance/dizziness and instruct in home and community integration  to attain remaining goals   Progress toward goals / Updated goals:    Short Term Goals: To be accomplished in????2-3????weeks:  1. Patient will demonstrate compliance with HEP. MET, pt reports compliance.   2. Patient will report less than or equal to 4/10 pain at worst to allow increased activity tolerance. Progressing, Pt continues to to report 5/10 L/S pain upon arriving.   ?? Long Term Goals: To be accomplished in????4-6????weeks:  1. Patient will demonstrate independence with HEP.  2. Patient will demonstrate 5/5 B hip abduction strength to facilitate functional mobility/gait.  3. Patient will score greater than or equal to 48/100 on FOTO, placing her in stage 4, independent community ambulator.  PLAN    [x]   Upgrade activities as tolerated  [x]   Update interventions per flow sheet YES Continue plan of care   []   Discharge due to :    []   Other:      Therapist: Kennith Center, DPT    Date: 10/19/2018 Time: 10:10 AM     Future Appointments   Date Time Provider [place]   10/19/2018 10:45 AM [name], DO [facility] [place]   10/19/2018  3:00 PM Nelida Meuse, PT [facility]   10/22/2018 11:30 AM Nelida Meuse, PT [facility]   10/26/2018 10:30 AM Bjorn Pippin, PT [facility]   10/28/2018 11:00 AM Nelida Meuse, PT [facility]   11/02/2018 11:00 AM Leata Mouse [facility]   11/04/2018 11:00 AM Nelida Meuse, PT [facility]   11/09/2018 10:30 AM Nelida Meuse, PT [facility]   11/11/2018 11:00 AM Nelida Meuse, PT [facility]

## 2018-10-19 NOTE — Telephone Encounter (Signed)
Patient had scheduled appt on 10/19/18 at 10:45. Patient arrived to appointment at 11:15. Patient was late, informed patient she would need to reschedule. Patient stated yelling stating "If ya'll would answer the phone we wouldn't be here right now". I apologized to the patient for any inconvenience and rescheduled appt. Patients daughter then came up yelling that this is the worse doctors office ever, that we never answer the phone. It is our fault. Patient   needs prior auth for insulin, the pharmacy charged $400 so is requesting prior auth or something different prescribed. Please advise, thank you.

## 2018-10-21 NOTE — Telephone Encounter (Signed)
Called [facility] to find out which medication she was charge the 982$ price for Trulicity medication. Spoke to [place] and she stated that patient [facility] is only covering 304$ and the total cost of the medication is 908$.Patient is not currently on any medication for diabetes at the moment. Medication was discontinued on 02/23/2018

## 2018-10-22 ENCOUNTER — Inpatient Hospital Stay: Payer: PRIVATE HEALTH INSURANCE | Primary: Nurse Practitioner

## 2018-10-23 ENCOUNTER — Ambulatory Visit: Attending: Sports Medicine | Primary: Nurse Practitioner

## 2018-10-23 ENCOUNTER — Ambulatory Visit
Admit: 2018-10-23 | Discharge: 2018-10-23 | Payer: PRIVATE HEALTH INSURANCE | Attending: Sports Medicine | Primary: Nurse Practitioner

## 2018-10-23 DIAGNOSIS — E119 Type 2 diabetes mellitus without complications: Secondary | ICD-10-CM

## 2018-10-23 MED ORDER — GABAPENTIN 400 MG CAP
400 mg | ORAL_CAPSULE | ORAL | 3 refills | Status: DC
Start: 2018-10-23 — End: 2019-05-26

## 2018-10-23 MED ORDER — LORAZEPAM 0.5 MG TAB
0.5 mg | ORAL_TABLET | ORAL | 0 refills | Status: DC | PRN
Start: 2018-10-23 — End: 2018-12-24

## 2018-10-23 NOTE — Addendum Note (Signed)
Addendum Note by Edd Arbour, LPN at 64/40/34 [id number]                Author: Edd Arbour, LPN  Service: --  Author Type: Licensed Nurse       Filed: 11/05/18 0950  Encounter Date: 10/23/2018  Status: Signed          Editor: Edd Arbour, LPN (Licensed Nurse)          Addended by: Murlean Caller R on: 11/05/2018 09:50 AM    Modules accepted: Orders

## 2018-10-23 NOTE — Addendum Note (Signed)
Addendum  Note by Normand Sloop A at 10/23/18 1115                Author: Normand Sloop A  Service: --  Author Type: Physician       Filed: 11/05/18 1038  Encounter Date: 10/23/2018  Status: Signed          Editor: Normand Sloop A          Addended by: Normand Sloop A on: 11/05/2018 10:38 AM    Modules accepted: Orders

## 2018-10-23 NOTE — Progress Notes (Signed)
Impression / Plan     Diagnoses and all orders for this visit:    1. Controlled type 2 diabetes mellitus without complication, with long-term current use of insulin (HCC)  -     HEMOGLOBIN A1C WITH EAG; Future  -     MICROALBUMIN, UR, RAND W/ MICROALB/CREAT RATIO; Future  -     REFERRAL TO OPTOMETRY    2. Lumbar radiculopathy, acute  -     gabapentin (NEURONTIN) 400 mg capsule; 800 mg PO TID    3. Pure hypercholesterolemia  -     LIPID PANEL; Future  -     METABOLIC PANEL, COMPREHENSIVE; Future    4. Hypertension, unspecified type  -     CBC WITH AUTOMATED DIFF; Future  -     TSH 3RD GENERATION; Future    5. Other depression  -     LORazepam (ATIVAN) 0.5 mg tablet; Take 1 Tab by mouth every four (4) hours as needed for Anxiety. Max Daily Amount: 3 mg.    6. Colon cancer screening  -     HM COLONOSCOPY    7. Well woman exam  -     REFERRAL TO OBSTETRICS AND GYNECOLOGY    8. Liver mass, left lobe  -     CT ABD W WO CONT; Future    9. Lung mass    10. Tympanic membrane perforation, left  -     REFERRAL TO ENT-OTOLARYNGOLOGY    Plan: Advise Follow Up CT CHEST 04/2020 to Monitor LEFT Lung Mass and will move forward today with CT Abdomen Hepatic Mass Protocol to evaluate Liver Mass. Will obtain HA1c, Microalbumin for DM, consider further Titration of Insulin IF necessary, and send for Optometry consult for DM Eye Exam. Add Lipid Panel and CMP for HLD. Schedule Colonoscopy and consult GYN for Well Woman Examination. Defers treatment options for Smoking Cessation currently. Awaiting appointment with Pain Clinic for Lumbar Disc Disease. Clinical benefit with LEFT Trochanteric Bursa Injection today, advise to Monitor RBS 5 Days Post Procedure, given HEP and may continue NSAID's PRN. Large LEFT TM Perforation, will send to ENT for further evaluation and management. Will renew Ativan PRN and encourage to make appointment with [facility], plan to [facility] from Ativan and Titrate Effexor IF necessary, will Monitor on Follow Up.      Follow-up and Dispositions    ?? Return in about 3 months (around 01/22/2019) for DM-30 minutes.       HPI   Barbara [name] is a 53 y.o.female presenting for CPE. Last seen 09/14/2018, DX- Lumbar Disc Disease and LEFT Hip Trochanteric Bursitis. Awaiting appointment with Pain Clinic for Injection Therapy of the Lumbar Spine. Plain Films of the LEFT Hip WNL 09/30/2018, plan for Trochanteric Bursa Injection today, PT and NSAID's NOT helpful. Barbara [name] reports benefit with Effexor and Ativan PRN for Depression, to schedule appointment with [facility] Currently defers treatment options for Smoking Cessation. OFF Onglyza secondary to Cost, will obtain HbA1c today and consider adjustments in Insulin. To schedule Colonoscopy and Well Woman Examination with GYN. Barbara [name] had Double Mastectomy for Breast Cancer Prophylaxis and on Femara, Follows Up with HEM/ONC. CT Chest 04/20/2018 shows 5 mm LEFT Lung Nodule with recommendation for 2 year Follow Up but RIGHT Hepatic Mass recommending CT or MRI Follow Up.     Review of Systems   Constitutional: Positive for malaise/fatigue.   HENT: Positive for ear pain and hearing loss.    Eyes: Negative.  Respiratory: Negative.    Cardiovascular: Negative.    Gastrointestinal: Negative.    Genitourinary: Negative.    Musculoskeletal: Negative.    Skin: Negative.    Neurological: Negative.    Endo/Heme/Allergies: Negative.    Psychiatric/Behavioral: Positive for depression. The patient is nervous/anxious.       Medications     Outpatient Medications Prior to Visit   Medication Sig Dispense Refill   ??? LORazepam (ATIVAN) 0.5 mg tablet Take 1 Tab by mouth every four (4) hours as needed for Anxiety. Max Daily Amount: 3 mg. 20 Tab 0   ??? diclofenac EC (VOLTAREN) 75 mg EC tablet Take 1 Tab by mouth two (2) times a day. 30 Tab 3   ??? atorvastatin (LIPITOR) 10 mg tablet Take 1 Tab by mouth daily. 90 Tab 1   ??? venlafaxine-SR (EFFEXOR-XR) 75 mg capsule Take 2 Caps by mouth daily. 90 Cap 3    ??? gabapentin (NEURONTIN) 400 mg capsule 800 mg PO TID 270 Cap 3   ??? ondansetron (ZOFRAN ODT) 8 mg disintegrating tablet Take 1 Tab by mouth every eight (8) hours as needed for Nausea. 10 Tab 0   ??? insulin NPH/insulin regular (NOVOLIN 70/30, HUMULIN 70/30) 100 unit/mL (70-30) injection 35 Units Daily (before breakfast) AND 30 Units Daily (before dinner). 20 mL 3   ??? ramipril (ALTACE) 10 mg capsule Take 1 Cap by mouth daily. 90 Cap 3   ??? nystatin (MYCOSTATIN) powder Apply to rash 4 times a day 60 g 3   ??? ketoconazole (NIZORAL) 2 % topical cream Apply  to affected area two (2) times a day. 60 g 3   ??? sAXagliptin (ONGLYZA) 5 mg tab tablet Take 1 Tab by mouth daily. 90 Tab 1   ??? letrozole (FEMARA) 2.5 mg tablet Take 2.5 mg by mouth daily.       No facility-administered medications prior to visit.         Allergies     Allergies   Allergen Reactions   ??? Tramadol Anaphylaxis   ??? Metformin Diarrhea   ??? Trulicity [Dulaglutide] Nausea Only       Problem List     Patient Active Problem List    Diagnosis Date Noted   ??? Recurrent depression ([place]) 02/23/2018   ??? History of breast cancer 02/23/2018   ??? Cigarette nicotine dependence without complication 69/67/8938   ??? S/P mastectomy, left 06/11/2017   ??? Smoker 05/22/2017   ??? Dupuytren contracture 05/22/2017   ??? Low back pain 03/04/2017   ??? Essential hypertension 01/29/2017   ??? DM type 2, uncontrolled, with neuropathy ([place]) 01/29/2017   ??? Pain of left hip joint 01/29/2017        Medical / Surgical / Family History     Past Medical History:   Diagnosis Date   ??? Back pain    ??? Breast cancer ([place]) 2008 or 2009   ??? Chronic pain    ??? Diabetes ([place])    ??? FH: chemotherapy    ??? Hypertension    ??? Nausea & vomiting    ??? Neuropathy    ??? Radiation therapy complication 1017    NOT complication, just radiation     Past Surgical History:   Procedure Laterality Date   ??? BIOPSY BREAST     ??? BIOPSY FINE NEEDLE     ??? HX BREAST AUGMENTATION Right 2010   ??? HX BREAST AUGMENTATION Left 09/17/2017    left  breast reconstruction with exchange of tissue expander  per breast prostesis and left breast tissue expander performed by [name], MD at [place]   ??? HX BREAST AUGMENTATION Right 04/01/2018    removal of tissue expanders with placment of right permanent breast prostesis performed by [name], MD at [place]   ??? HX BREAST RECONSTRUCTION Right 2011    per pt - attempted latissimus dorsi flap, then saline implant   ??? HX BREAST RECONSTRUCTION Bilateral 06/10/2017    right breast prosthesis and capsule removal and bilateral breast tissue  expander reconstruction.  performed by [name], MD at [place]   ??? HX BREAST RECONSTRUCTION Left 07/09/2017    left breast  EXCISION and closure of wound performed by [name], MD at [place]   ??? HX BREAST RECONSTRUCTION Bilateral     Tissue expanders, to prepare for implants    ??? HX CESAREAN SECTION     ??? HX LUMBAR DISKECTOMY      herniated disc   ??? HX MASTECTOMY Right 2010    Total mastectomy per pt.     ??? HX MASTECTOMY Left 06/10/2017    left mastectomy,Sentinal node biopsies. performed by [name], MD at [facility] MAIN OR   ??? HX MASTECTOMY Bilateral    ??? HX MASTOPEXY (BREAST LIFT) Left 2011   ??? IMPLANT BREAST SILICONE/EQ Right 1884    post mastectomy   ??? THUMB SUPPORT      thumb surgery to mobilize thumb     Family History   Problem Relation Age of Onset   ??? Diabetes Mother    ??? No Known Problems Father        Social History     Social History     Socioeconomic History   ??? Marital status: MARRIED     Spouse name: Not on file   ??? Number of children: Not on file   ??? Years of education: Not on file   ??? Highest education level: Not on file   Occupational History   ??? Not on file   Social Needs   ??? Financial resource strain: Not on file   ??? Food insecurity:     Worry: Not on file     Inability: Not on file   ??? Transportation needs:     Medical: Not on file     Non-medical: Not on file   Tobacco Use   ??? Smoking status: Current Every Day  Smoker     Packs/day: 0.50     Years: 15.00     Pack years: 7.50   ??? Smokeless tobacco: Never Used   ??? Tobacco comment: 5-6 cigs per day patient education given   Substance and Sexual Activity   ??? Alcohol use: No   ??? Drug use: No   ??? Sexual activity: Not on file   Lifestyle   ??? Physical activity:     Days per week: Not on file     Minutes per session: Not on file   ??? Stress: Not on file   Relationships   ??? Social connections:     Talks on phone: Not on file     Gets together: Not on file     Attends religious service: Not on file     Active member of club or organization: Not on file     Attends meetings of clubs or organizations: Not on file     Relationship status: Not on file   ??? Intimate partner violence:  Fear of current or ex partner: Not on file     Emotionally abused: Not on file     Physically abused: Not on file     Forced sexual activity: Not on file   Other Topics Concern   ??? Military Service Not Asked   ??? Blood Transfusions Not Asked   ??? Caffeine Concern Not Asked   ??? Occupational Exposure Not Asked   ??? Hobby Hazards Not Asked   ??? Sleep Concern Not Asked   ??? Stress Concern Not Asked   ??? Weight Concern Not Asked   ??? Special Diet Not Asked   ??? Back Care Not Asked   ??? Exercise Not Asked   ??? Bike Helmet Not Asked   ??? Seat Belt Not Asked   ??? Self-Exams Not Asked   Social History Narrative   ??? Not on file        ROS   Review of Systems    10 Element ROS negative unless specifically stated in History of Present Illness.     Health Maintenance     Health Maintenance   Topic Date Due   ??? PAP AKA CERVICAL CYTOLOGY  11/27/1988   ??? Shingrix Vaccine Age 7> (1 of 2) 11/27/2017   ??? FOBT Q 1 YEAR AGE 1-75  11/27/2017   ??? MICROALBUMIN Q1  01/29/2018   ??? Influenza Age 59 to Adult  05/14/2018   ??? LIPID PANEL Q1  05/15/2018   ??? HEMOGLOBIN A1C Q6M  08/26/2018   ??? EYE EXAM RETINAL OR DILATED  03/27/2019   ??? BREAST CANCER SCRN MAMMOGRAM  05/14/2019   ??? FOOT EXAM Q1  08/13/2019   ??? DTaP/Tdap/Td series (2 - Td) 02/04/2024    ??? Bone Densitometry (Dexa) Screening  11/27/2032   ??? Pneumococcal 0-64 years  Completed     Physical Exam   BP 96/62 (BP 1 Location: Left arm, BP Patient Position: Sitting)    Pulse 62    Temp 98.1 ??F (36.7 ??C) (Oral)    Resp 18    Ht 5\' 2"  (1.575 m)    Wt 132 lb 9.6 oz (60.1 kg)    SpO2 93%    BMI 24.25 kg/m??     Physical Exam  Constitutional:       Appearance: Normal appearance. She is normal weight. She is ill-appearing.   HENT:      Head: Normocephalic and atraumatic.      Ears:      Comments: Large LEFT TM Perforation     Nose: Congestion and rhinorrhea present.      Mouth/Throat:      Pharynx: Posterior oropharyngeal erythema present. No oropharyngeal exudate.   Eyes:      Extraocular Movements: Extraocular movements intact.      Conjunctiva/sclera: Conjunctivae normal.      Pupils: Pupils are equal, round, and reactive to light.   Neck:      Musculoskeletal: Normal range of motion and neck supple. No muscular tenderness.   Cardiovascular:      Rate and Rhythm: Normal rate and regular rhythm.      Pulses: Normal pulses.      Heart sounds: Normal heart sounds. No murmur.   Pulmonary:      Effort: Pulmonary effort is normal. No respiratory distress.      Breath sounds: Normal breath sounds. No wheezing.      Comments: Harsh Bibasilar  Abdominal:      General: Abdomen is flat. Bowel sounds are normal.  There is no distension.      Palpations: Abdomen is soft.      Tenderness: There is no tenderness.   Lymphadenopathy:      Cervical: No cervical adenopathy.   Skin:     General: Skin is warm and dry.      Findings: No rash.   Neurological:      General: No focal deficit present.      Mental Status: She is alert and oriented to person, place, and time. Mental status is at baseline.      Gait: Gait normal.   Psychiatric:         Mood and Affect: Mood normal.         Behavior: Behavior normal.         Thought Content: Thought content normal.         Judgment: Judgment normal.       Left Hip Exam     Tenderness   The  patient is experiencing tenderness in the greater trochanter.    Range of Motion   The patient has normal left hip ROM.    Muscle Strength   The patient has normal left hip strength.     Tests   FABER: negative  Ober: negative    Other   Erythema: absent  Scars: absent  Sensation: normal  Pulse: present        Procedure: Time out complete. The risks, benefits, and alternatives discussed, written and verbal consent obtained. The LEFT Lateral Hip was prepped and draped in the usual sterile manner, RIGHT Lateral Recumbant. Use of ethyl chloride for local anesthesia. 2 mL of 0.25% Celestone, 3 mL of Marcaine 0.25% and 3 mL of 1% Lidocaine without Epinephrine on 22 gauge needle and 6 mL syringe for the injection of the Trochanteric Bursa.  The patient tolerated the procedure well without complications. Standard post-procedure care is explained and return precautions given.       [name], DO

## 2018-10-23 NOTE — Progress Notes (Signed)
 Room #  5  Chief Complaint:    Medication refill  HPI:    Barbara [name] is a 53 y.o. female who presents today for c/o medication refill    1. Have you been to the ER, urgent care clinic since your last visit?  Hospitalized since your last visit?NO    2. Have you seen or consulted any other health care providers outside of the [facility] since your last visit?  Include any pap smears or colon screening.NO  When :  Reason:    Health Maintenance reviewed Yes    Health Maintenance Due   Topic Date Due   . PAP AKA CERVICAL CYTOLOGY  11/27/1988   . Shingrix Vaccine Age 31> (1 of 2) 11/27/2017   . FOBT Q 1 YEAR AGE 20-75  11/27/2017   . MICROALBUMIN Q1  01/29/2018   . Influenza Age 62 to Adult  05/14/2018   . LIPID PANEL Q1  05/15/2018   . HEMOGLOBIN A1C Q6M  08/26/2018

## 2018-10-23 NOTE — Progress Notes (Signed)
Room #  5  Chief Complaint:    Medication refill  HPI:    Barbara [name] is a 53 y.o. female who presents today for c/o medication refill    1. Have you been to the ER, urgent care clinic since your last visit?  Hospitalized since your last visit?NO    2. Have you seen or consulted any other health care providers outside of the [place] since your last visit?  Include any pap smears or colon screening.NO  When :  Reason:    Health Maintenance reviewed Yes    Health Maintenance Due   Topic Date Due   ??? PAP AKA CERVICAL CYTOLOGY  11/27/1988   ??? Shingrix Vaccine Age 109> (1 of 2) 11/27/2017   ??? FOBT Q 1 YEAR AGE 72-75  11/27/2017   ??? MICROALBUMIN Q1  01/29/2018   ??? Influenza Age 70 to Adult  05/14/2018   ??? LIPID PANEL Q1  05/15/2018   ??? HEMOGLOBIN A1C Q6M  08/26/2018

## 2018-10-23 NOTE — Progress Notes (Signed)
Impression / Plan     Diagnoses and all orders for this visit:    1. Controlled type 2 diabetes mellitus without complication, with long-term current use of insulin (HCC)  -     HEMOGLOBIN A1C WITH EAG; Future  -     MICROALBUMIN, UR, RAND W/ MICROALB/CREAT RATIO; Future  -     REFERRAL TO OPTOMETRY    2. Lumbar radiculopathy, acute  -     gabapentin (NEURONTIN) 400 mg capsule; 800 mg PO TID    3. Pure hypercholesterolemia  -     LIPID PANEL; Future  -     METABOLIC PANEL, COMPREHENSIVE; Future    4. Hypertension, unspecified type  -     CBC WITH AUTOMATED DIFF; Future  -     TSH 3RD GENERATION; Future    5. Other depression  -     LORazepam (ATIVAN) 0.5 mg tablet; Take 1 Tab by mouth every four (4) hours as needed for Anxiety. Max Daily Amount: 3 mg.    6. Colon cancer screening  -     HM COLONOSCOPY    7. Well woman exam  -     REFERRAL TO OBSTETRICS AND GYNECOLOGY    8. Liver mass, left lobe  -     CT ABD W WO CONT; Future    9. Lung mass    10. Tympanic membrane perforation, left  -     REFERRAL TO ENT-OTOLARYNGOLOGY    Plan: Advise Follow Up CT CHEST 04/2020 to Monitor LEFT Lung Mass and will move forward today with CT Abdomen Hepatic Mass Protocol to evaluate Liver Mass. Will obtain HA1c, Microalbumin for DM, consider further Titration of Insulin IF necessary, and send for Optometry consult for DM Eye Exam. Add Lipid Panel and CMP for HLD. Schedule Colonoscopy and consult GYN for Well Woman Examination. Defers treatment options for Smoking Cessation currently. Awaiting appointment with Pain Clinic for Lumbar Disc Disease. Clinical benefit with LEFT Trochanteric Bursa Injection today, advise to Monitor RBS 5 Days Post Procedure, given HEP and may continue NSAID's PRN. Large LEFT TM Perforation, will send to ENT for further evaluation and management. Will renew Ativan PRN and encourage to make appointment with [facility], plan to [facility] from Ativan and Titrate Effexor IF  necessary, will Monitor on Follow Up.     Follow-up and Dispositions    ?? Return in about 3 months (around 01/22/2019) for DM-30 minutes.       HPI   Barbara [name] is a 53 y.o.female presenting for CPE. Last seen 09/14/2018, DX- Lumbar Disc Disease and LEFT Hip Trochanteric Bursitis. Awaiting appointment with Pain Clinic for Injection Therapy of the Lumbar Spine. Plain Films of the LEFT Hip WNL 09/30/2018, plan for Trochanteric Bursa Injection today, PT and NSAID's NOT helpful. Barbara [name] reports benefit with Effexor and Ativan PRN for Depression, to schedule appointment with [facility]. Currently defers treatment options for Smoking Cessation. OFF Onglyza secondary to Cost, will obtain HbA1c today and consider adjustments in Insulin. To schedule Colonoscopy and Well Woman Examination with GYN. Barbara [name] had Double Mastectomy for Breast Cancer Prophylaxis and on Femara, Follows Up with HEM/ONC. CT Chest 04/20/2018 shows 5 mm LEFT Lung Nodule with recommendation for 2 year Follow Up but RIGHT Hepatic Mass recommending CT or MRI Follow Up.     Review of Systems   Constitutional: Positive for malaise/fatigue.   HENT: Positive for ear pain and hearing loss.    Eyes: Negative.  Respiratory: Negative.    Cardiovascular: Negative.    Gastrointestinal: Negative.    Genitourinary: Negative.    Musculoskeletal: Negative.    Skin: Negative.    Neurological: Negative.    Endo/Heme/Allergies: Negative.    Psychiatric/Behavioral: Positive for depression. The patient is nervous/anxious.       Medications     Outpatient Medications Prior to Visit   Medication Sig Dispense Refill   ??? LORazepam (ATIVAN) 0.5 mg tablet Take 1 Tab by mouth every four (4) hours as needed for Anxiety. Max Daily Amount: 3 mg. 20 Tab 0   ??? diclofenac EC (VOLTAREN) 75 mg EC tablet Take 1 Tab by mouth two (2) times a day. 30 Tab 3   ??? atorvastatin (LIPITOR) 10 mg tablet Take 1 Tab by mouth daily. 90 Tab 1    ??? venlafaxine-SR (EFFEXOR-XR) 75 mg capsule Take 2 Caps by mouth daily. 90 Cap 3   ??? gabapentin (NEURONTIN) 400 mg capsule 800 mg PO TID 270 Cap 3   ??? ondansetron (ZOFRAN ODT) 8 mg disintegrating tablet Take 1 Tab by mouth every eight (8) hours as needed for Nausea. 10 Tab 0   ??? insulin NPH/insulin regular (NOVOLIN 70/30, HUMULIN 70/30) 100 unit/mL (70-30) injection 35 Units Daily (before breakfast) AND 30 Units Daily (before dinner). 20 mL 3   ??? ramipril (ALTACE) 10 mg capsule Take 1 Cap by mouth daily. 90 Cap 3   ??? nystatin (MYCOSTATIN) powder Apply to rash 4 times a day 60 g 3   ??? ketoconazole (NIZORAL) 2 % topical cream Apply  to affected area two (2) times a day. 60 g 3   ??? sAXagliptin (ONGLYZA) 5 mg tab tablet Take 1 Tab by mouth daily. 90 Tab 1   ??? letrozole (FEMARA) 2.5 mg tablet Take 2.5 mg by mouth daily.       No facility-administered medications prior to visit.         Allergies     Allergies   Allergen Reactions   ??? Tramadol Anaphylaxis   ??? Metformin Diarrhea   ??? Trulicity [Dulaglutide] Nausea Only       Problem List     Patient Active Problem List    Diagnosis Date Noted   ??? Recurrent depression ([place]) 02/23/2018   ??? History of breast cancer 02/23/2018   ??? Cigarette nicotine dependence without complication 88/41/6606   ??? S/P mastectomy, left 06/11/2017   ??? Smoker 05/22/2017   ??? Dupuytren contracture 05/22/2017   ??? Low back pain 03/04/2017   ??? Essential hypertension 01/29/2017   ??? DM type 2, uncontrolled, with neuropathy ([place]) 01/29/2017   ??? Pain of left hip joint 01/29/2017        Medical / Surgical / Family History     Past Medical History:   Diagnosis Date   ??? Back pain    ??? Breast cancer ([place]) 2008 or 2009   ??? Chronic pain    ??? Diabetes ([place])    ??? FH: chemotherapy    ??? Hypertension    ??? Nausea & vomiting    ??? Neuropathy    ??? Radiation therapy complication 3016    NOT complication, just radiation     Past Surgical History:   Procedure Laterality Date   ??? BIOPSY BREAST     ??? BIOPSY FINE NEEDLE      ??? HX BREAST AUGMENTATION Right 2010   ??? HX BREAST AUGMENTATION Left 09/17/2017    left breast reconstruction with exchange of tissue expander  per breast prostesis and left breast tissue expander performed by [name], MD at [place]   ??? HX BREAST AUGMENTATION Right 04/01/2018    removal of tissue expanders with placment of right permanent breast prostesis performed by [name], MD at [place]   ??? HX BREAST RECONSTRUCTION Right 2011    per pt - attempted latissimus dorsi flap, then saline implant   ??? HX BREAST RECONSTRUCTION Bilateral 06/10/2017    right breast prosthesis and capsule removal and bilateral breast tissue  expander reconstruction.  performed by [name], MD at [place]   ??? HX BREAST RECONSTRUCTION Left 07/09/2017    left breast  EXCISION and closure of wound performed by [name], MD at [place]   ??? HX BREAST RECONSTRUCTION Bilateral     Tissue expanders, to prepare for implants    ??? HX CESAREAN SECTION     ??? HX LUMBAR DISKECTOMY      herniated disc   ??? HX MASTECTOMY Right 2010    Total mastectomy per pt.     ??? HX MASTECTOMY Left 06/10/2017    left mastectomy,Sentinal node biopsies. performed by [name], MD at [facility] MAIN OR   ??? HX MASTECTOMY Bilateral    ??? HX MASTOPEXY (BREAST LIFT) Left 2011   ??? IMPLANT BREAST SILICONE/EQ Right 6237    post mastectomy   ??? THUMB SUPPORT      thumb surgery to mobilize thumb     Family History   Problem Relation Age of Onset   ??? Diabetes Mother    ??? No Known Problems Father        Social History     Social History     Socioeconomic History   ??? Marital status: MARRIED     Spouse name: Not on file   ??? Number of children: Not on file   ??? Years of education: Not on file   ??? Highest education level: Not on file   Occupational History   ??? Not on file   Social Needs   ??? Financial resource strain: Not on file   ??? Food insecurity:     Worry: Not on file     Inability: Not on file   ??? Transportation needs:      Medical: Not on file     Non-medical: Not on file   Tobacco Use   ??? Smoking status: Current Every Day Smoker     Packs/day: 0.50     Years: 15.00     Pack years: 7.50   ??? Smokeless tobacco: Never Used   ??? Tobacco comment: 5-6 cigs per day patient education given   Substance and Sexual Activity   ??? Alcohol use: No   ??? Drug use: No   ??? Sexual activity: Not on file   Lifestyle   ??? Physical activity:     Days per week: Not on file     Minutes per session: Not on file   ??? Stress: Not on file   Relationships   ??? Social connections:     Talks on phone: Not on file     Gets together: Not on file     Attends religious service: Not on file     Active member of club or organization: Not on file     Attends meetings of clubs or organizations: Not on file     Relationship status: Not on file   ??? Intimate partner violence:  Fear of current or ex partner: Not on file     Emotionally abused: Not on file     Physically abused: Not on file     Forced sexual activity: Not on file   Other Topics Concern   ??? Military Service Not Asked   ??? Blood Transfusions Not Asked   ??? Caffeine Concern Not Asked   ??? Occupational Exposure Not Asked   ??? Hobby Hazards Not Asked   ??? Sleep Concern Not Asked   ??? Stress Concern Not Asked   ??? Weight Concern Not Asked   ??? Special Diet Not Asked   ??? Back Care Not Asked   ??? Exercise Not Asked   ??? Bike Helmet Not Asked   ??? Seat Belt Not Asked   ??? Self-Exams Not Asked   Social History Narrative   ??? Not on file        ROS   Review of Systems    10 Element ROS negative unless specifically stated in History of Present Illness.     Health Maintenance     Health Maintenance   Topic Date Due   ??? PAP AKA CERVICAL CYTOLOGY  11/27/1988   ??? Shingrix Vaccine Age 39> (1 of 2) 11/27/2017   ??? FOBT Q 1 YEAR AGE 33-75  11/27/2017   ??? MICROALBUMIN Q1  01/29/2018   ??? Influenza Age 37 to Adult  05/14/2018   ??? LIPID PANEL Q1  05/15/2018   ??? HEMOGLOBIN A1C Q6M  08/26/2018   ??? EYE EXAM RETINAL OR DILATED  03/27/2019    ??? BREAST CANCER SCRN MAMMOGRAM  05/14/2019   ??? FOOT EXAM Q1  08/13/2019   ??? DTaP/Tdap/Td series (2 - Td) 02/04/2024   ??? Bone Densitometry (Dexa) Screening  11/27/2032   ??? Pneumococcal 0-64 years  Completed     Physical Exam   BP 96/62 (BP 1 Location: Left arm, BP Patient Position: Sitting)    Pulse 62    Temp 98.1 ??F (36.7 ??C) (Oral)    Resp 18    Ht 5\' 2"  (1.575 m)    Wt 132 lb 9.6 oz (60.1 kg)    SpO2 93%    BMI 24.25 kg/m??     Physical Exam  Constitutional:       Appearance: Normal appearance. She is normal weight. She is ill-appearing.   HENT:      Head: Normocephalic and atraumatic.      Ears:      Comments: Large LEFT TM Perforation     Nose: Congestion and rhinorrhea present.      Mouth/Throat:      Pharynx: Posterior oropharyngeal erythema present. No oropharyngeal exudate.   Eyes:      Extraocular Movements: Extraocular movements intact.      Conjunctiva/sclera: Conjunctivae normal.      Pupils: Pupils are equal, round, and reactive to light.   Neck:      Musculoskeletal: Normal range of motion and neck supple. No muscular tenderness.   Cardiovascular:      Rate and Rhythm: Normal rate and regular rhythm.      Pulses: Normal pulses.      Heart sounds: Normal heart sounds. No murmur.   Pulmonary:      Effort: Pulmonary effort is normal. No respiratory distress.      Breath sounds: Normal breath sounds. No wheezing.      Comments: Harsh Bibasilar  Abdominal:      General: Abdomen is flat. Bowel sounds are normal.  There is no distension.      Palpations: Abdomen is soft.      Tenderness: There is no tenderness.   Lymphadenopathy:      Cervical: No cervical adenopathy.   Skin:     General: Skin is warm and dry.      Findings: No rash.   Neurological:      General: No focal deficit present.      Mental Status: She is alert and oriented to person, place, and time. Mental status is at baseline.      Gait: Gait normal.   Psychiatric:         Mood and Affect: Mood normal.         Behavior: Behavior normal.          Thought Content: Thought content normal.         Judgment: Judgment normal.       Left Hip Exam     Tenderness   The patient is experiencing tenderness in the greater trochanter.    Range of Motion   The patient has normal left hip ROM.    Muscle Strength   The patient has normal left hip strength.     Tests   FABER: negative  Ober: negative    Other   Erythema: absent  Scars: absent  Sensation: normal  Pulse: present        Procedure: Time out complete. The risks, benefits, and alternatives discussed, written and verbal consent obtained. The LEFT Lateral Hip was prepped and draped in the usual sterile manner, RIGHT Lateral Recumbant. Use of ethyl chloride for local anesthesia. 2 mL of 0.25% Celestone, 3 mL of Marcaine 0.25% and 3 mL of 1% Lidocaine without Epinephrine on 22 gauge needle and 6 mL syringe for the injection of the Trochanteric Bursa.  The patient tolerated the procedure well without complications. Standard post-procedure care is explained and return precautions given.       [name], DO

## 2018-10-23 NOTE — Addendum Note (Signed)
Addended by: Murlean Caller R on: 11/05/2018 09:50 AM     Modules accepted: Orders

## 2018-10-23 NOTE — Addendum Note (Signed)
Addended by: Normand Sloop A on: 11/05/2018 10:38 AM     Modules accepted: Orders

## 2018-10-26 NOTE — Progress Notes (Signed)
 [facility] PHYSICAL THERAPY  [address], [place], [place] [address] - Phone: [phone]  Fax: [phone]  DISCHARGE SUMMARY  Patient Name: Barbara [name] DOB: 1966-02-20   Treatment/Medical Diagnosis: Left hip pain [M25.552]  Trochanteric bursitis, left hip [M70.62]  Low back pain [M54.5]   Referral Source: Claudene Mabel LABOR, DO     Date of Initial Visit: 09/23/2018 Attended Visits: 5 Missed Visits: 5     SUMMARY OF TREATMENT  Treatment has consisted of initial evaluation and 4 treatment sessions, which have included core/LE strengthening exercises, modalities for pain relief and patient education, including HEP.   CURRENT STATUS  Patient was last seen in clinic on 10/19/2018, at which time she reported 5/10 pain.  Patient has cancelled 2 appointments (09/25/2018 and 12/127/2019) and did not show for 3 appointments (10/16/2018, 10/22/2018 and 10/26/2018).  Patient was called after each no show and was advised of attendance policy.  Patient was called after no show 10/26/2018 and advised that she will be discharged due to 5 missed appointments (2 cancellations, 3 no shows).  Patient was agreeable to discharge, stating that she does not have money for co-pay and does not feel that therapy is helping.  Patient states that her doctor told her that she does not have to continue with therapy if it is not helping.    Goal/Measure of Progress Goal Met?   1.  Patient will demonstrate independence with HEP.   Status at last Eval: NA Current Status: NT no   2.  Patient will demonstrate 5/5 B hip abduction strength to facilitate functional mobility/gait.   Status at last Eval: B hip abd = 4/5 Current Status: NT no   3.  Patient will score greater than or equal to 48/100 on FOTO, placing her in stage 4, independent community ambulator.   Status at last Eval: FOTO = 34/100 Current Status: NT no     RECOMMENDATIONS  Discontinue therapy due to lack of attendance or compliance.    If you  have any questions/comments please contact us  directly at [phone].   Thank you for allowing us  to assist in the care of your patient.    Therapist Signature: Sharyle Pattee, PT Date: 10/26/2018     Time: 10:49 AM     NOTE TO PHYSICIAN:  PLEASE COMPLETE THE ORDERS BELOW AND FAX TO   [facility]: [phone].  If you are unable to process this request in 24 hours please contact our office: [phone].    ___ I have read the above report and request that my patient be discharged from therapy.     Physician Signature:        Date:       Time:

## 2018-10-26 NOTE — Progress Notes (Signed)
[place] ??? [facility] PHYSICAL THERAPY  [address] [place] [address] - Phone: [phone]  Fax: [phone]  DISCHARGE SUMMARY  Patient Name: Barbara [name] DOB: August 30, 1966   Treatment/Medical Diagnosis: Left hip pain [M25.552]  Trochanteric bursitis, left hip [M70.62]  Low back pain [M54.5]   Referral Source: [name], DO     Date of Initial Visit: 09/23/2018 Attended Visits: 5 Missed Visits: 5     SUMMARY OF TREATMENT  Treatment has consisted of initial evaluation and 4 treatment sessions, which have included core/LE strengthening exercises, modalities for pain relief and patient education, including HEP.   CURRENT STATUS  Patient was last seen in clinic on 10/19/2018, at which time she reported 5/10 pain.  Patient has cancelled 2 appointments (09/25/2018 and 12/127/2019) and did not show for 3 appointments (10/16/2018, 10/22/2018 and 10/26/2018).  Patient was called after each no show and was advised of attendance policy.  Patient was called after no show 10/26/2018 and advised that she will be discharged due to 5 missed appointments (2 cancellations, 3 no shows).  Patient was agreeable to discharge, stating that she does not have money for co-pay and does not feel that therapy is helping.  Patient states that her doctor told her that she does not have to continue with therapy if it is not helping.    Goal/Measure of Progress Goal Met?   1.  Patient will demonstrate independence with HEP.   Status at last Eval: NA Current Status: NT no   2.  Patient will demonstrate 5/5 B hip abduction strength to facilitate functional mobility/gait.   Status at last Eval: B hip abd = 4/5 Current Status: NT no   3.  Patient will score greater than or equal to 48/100 on FOTO, placing her in stage 4, independent community ambulator.   Status at last Eval: FOTO = 34/100 Current Status: NT no     RECOMMENDATIONS  Discontinue therapy due to lack of attendance or compliance.     If you have any questions/comments please contact us directly at [phone].   Thank you for allowing [place] to assist in the care of your patient.    Therapist Signature: Bjorn Pippin, PT Date: 10/26/2018     Time: 10:49 AM     NOTE TO PHYSICIAN:  PLEASE COMPLETE THE ORDERS BELOW AND FAX TO   [facility]: [phone].  If you are unable to process this request in 24 hours please contact our office: [phone].    ___ I have read the above report and request that my patient be discharged from therapy.     Physician Signature:        Date:       Time:

## 2018-10-27 ENCOUNTER — Inpatient Hospital Stay: Payer: PRIVATE HEALTH INSURANCE | Primary: Nurse Practitioner

## 2018-10-28 ENCOUNTER — Encounter: Payer: PRIVATE HEALTH INSURANCE | Primary: Nurse Practitioner

## 2018-11-02 ENCOUNTER — Encounter: Payer: PRIVATE HEALTH INSURANCE | Primary: Nurse Practitioner

## 2018-11-04 ENCOUNTER — Encounter: Admit: 2018-11-04 | Discharge: 2018-11-04 | Payer: PRIVATE HEALTH INSURANCE | Primary: Nurse Practitioner

## 2018-11-04 ENCOUNTER — Other Ambulatory Visit: Payer: Self-pay | Primary: Nurse Practitioner

## 2018-11-04 ENCOUNTER — Encounter: Payer: PRIVATE HEALTH INSURANCE | Primary: Nurse Practitioner

## 2018-11-04 ENCOUNTER — Inpatient Hospital Stay: Admit: 2018-11-04 | Payer: PRIVATE HEALTH INSURANCE | Attending: Sports Medicine | Primary: Nurse Practitioner

## 2018-11-04 DIAGNOSIS — K8020 Calculus of gallbladder without cholecystitis without obstruction: Secondary | ICD-10-CM

## 2018-11-04 LAB — CREATININE, POC
Creatinine, POC: 0.4 MG/DL — ABNORMAL LOW (ref 0.6–1.3)
GFRAA, POC: >60 mL/min/{1.73_m2} (ref >60)
GFRNA, POC: >60 mL/min/{1.73_m2} (ref >60)

## 2018-11-04 LAB — AMB POC CREATININE
Creatinine, POC: 0.4 MG/DL — ABNORMAL LOW (ref 0.6–1.3)
GFR African American: >60 mL/min/{1.73_m2} (ref >60)
GFR Non-African American: >60 mL/min/{1.73_m2} (ref >60)

## 2018-11-04 MED ORDER — IOPAMIDOL 61 % IV SOLN
30061 mg iodine /mL (61 %) | Freq: Once | INTRAVENOUS | Status: AC
Start: 2018-11-04 — End: 2018-11-04
  Administered 2018-11-04: 18:00:00 via INTRAVENOUS

## 2018-11-04 MED FILL — ISOVUE-300  61 % INTRAVENOUS SOLUTION: 300 mg iodine /mL (61 %) | INTRAVENOUS | Qty: 100

## 2018-11-04 NOTE — Telephone Encounter (Signed)
Please let Barbara [name] know that CT Abdomen shows No mass, ONLY Non-specific calcification, and Cholelithiasis. IF she does NOT have RUQ Abdominal Pain, Nausea, Vomiting, etc. We will continue to Monitor. Thank you.

## 2018-11-05 ENCOUNTER — Ambulatory Visit: Attending: Sports Medicine | Primary: Nurse Practitioner

## 2018-11-05 ENCOUNTER — Ambulatory Visit
Admit: 2018-11-05 | Discharge: 2018-11-05 | Payer: PRIVATE HEALTH INSURANCE | Attending: Sports Medicine | Primary: Nurse Practitioner

## 2018-11-05 DIAGNOSIS — Z01818 Encounter for other preprocedural examination: Secondary | ICD-10-CM

## 2018-11-05 LAB — LIPID PANEL
CHOLESTEROL/HDL: 2.8 (ref 0.0–5.0)
Cholesterol, Total: 141 mg/dL (ref 110–200)
Cholesterol, total: 141 mg/dL (ref 110–200)
HDL Cholesterol: 50 mg/dL (ref >=40)
HDL: 2.8 NA (ref 0.0–5.0)
HDL: 50 mg/dL (ref >=40)
LDL Calculated: 62 mg/dL (ref 50–99)
LDL, calculated: 62 mg/dL (ref 50–99)
LDL/HDL Ratio: 1.2
LDL/HDL Ratio: 1.2 NA
Non-HDL Cholesterol: 91 mg/dL (ref <130)
Non-HDL Cholesterol: 91 mg/dL (ref <130)
Triglyceride: 142 mg/dL (ref 40–149)
Triglycerides: 142 mg/dL (ref 40–149)
VLDL Cholesterol Calculated: 28 mg/dL (ref 8–30)
VLDL, calculated: 28 mg/dL (ref 8–30)

## 2018-11-05 LAB — METABOLIC PANEL, COMPREHENSIVE
A-G Ratio: 1.6 ratio (ref 1.1–2.6)
ALT (SGPT): 23 U/L (ref 5–40)
AST (SGOT): 19 U/L (ref 10–37)
Albumin: 4.4 g/dL (ref 3.5–5.0)
Alk. phosphatase: 196 U/L — ABNORMAL HIGH (ref 25–115)
Anion gap: 12 mmol/L
BUN: 10 mg/dL (ref 6–22)
Bilirubin, total: 0.2 mg/dL (ref 0.2–1.2)
CO2: 25 mmol/L (ref 20–32)
Calcium: 9.2 mg/dL (ref 8.4–10.5)
Chloride: 100 mmol/L (ref 98–110)
Creatinine: 0.4 mg/dL — ABNORMAL LOW (ref 0.5–1.2)
GFRAA: >60 (ref >60.0)
GFRNA: >60 (ref >60.0)
Globulin: 2.7 g/dL (ref 2.0–4.0)
Glucose: 273 mg/dL — ABNORMAL HIGH (ref 70–99)
Potassium: 4.4 mmol/L (ref 3.5–5.5)
Protein, total: 7.1 g/dL (ref 6.4–8.3)
Sodium: 137 mmol/L (ref 133–145)

## 2018-11-05 LAB — CBC WITH AUTOMATED DIFF
ABS. BASOPHILS: 0.1 10*3/uL (ref 0.0–0.2)
ABS. EOSINOPHILS: 0.2 10*3/uL (ref 0.0–0.5)
ABS. MONOCYTES: 0.8 10*3/uL (ref 0.1–1.0)
ABS. NEUTROPHILS: 7.3 10*3/uL (ref 1.8–7.7)
ABSOLUTE LYMPHOCYTE COUNT: 3.9 10*3/uL (ref 1.0–4.8)
BASOPHILS: 1 % (ref 0–2)
EOSINOPHILS: 1 % (ref 0–6)
HCT: 46.8 % (ref 35.1–48.0)
HGB: 15 g/dL (ref 11.7–16.0)
Lymphocytes: 32 % (ref 20–45)
MCH: 30 pg (ref 26–34)
MCHC: 32 g/dL (ref 31–36)
MCV: 94 fL (ref 81–99)
MONOCYTES: 6 % (ref 3–12)
MPV: 11.1 fL (ref 9.0–13.0)
NEUTROPHILS: 60 % (ref 40–75)
PLATELET: 279 10*3/uL (ref 140–440)
RBC: 4.98 M/uL (ref 3.80–5.20)
RDW: 13.3 % (ref 10.0–15.5)
WBC: 12.4 10*3/uL — ABNORMAL HIGH (ref 4.0–11.0)

## 2018-11-05 LAB — MICROALBUMIN, UR, RAND W/ MICROALB/CREAT RATIO
Creatinine, urine random: 83 mg/dL
Microalb/Creat ratio (ug/mg creat.): 15.7 (ref 0.0–30.0)
Microalbumin, urine: 13 mg/L (ref 0.1–17.0)

## 2018-11-05 LAB — TSH 3RD GENERATION
TSH: 1.17 uU/mL (ref 0.27–4.20)
TSH: 1.17 uU/mL (ref 0.27–4.20)

## 2018-11-05 LAB — HEMOGLOBIN A1C W/O EAG
AVERAGE GLUCOSE: 286 mg/dL — ABNORMAL HIGH (ref 91–123)
AVG GLU: 286 mg/dL — ABNORMAL HIGH (ref 91–123)
Hemoglobin A1C: 11.6 % — ABNORMAL HIGH (ref 4.8–5.6)
Hemoglobin A1c: 11.6 % — ABNORMAL HIGH (ref 4.8–5.6)

## 2018-11-05 LAB — COMPREHENSIVE METABOLIC PANEL
ALT: 23 U/L (ref 5–40)
AST: 19 U/L (ref 10–37)
Albumin/Globulin Ratio: 1.6 ratio (ref 1.1–2.6)
Albumin: 4.4 g/dL (ref 3.5–5.0)
Alkaline Phosphatase: 196 U/L — ABNORMAL HIGH (ref 25–115)
Anion Gap: 12 mmol/L
BUN: 10 mg/dL (ref 6–22)
CO2: 25 mmol/L (ref 20–32)
Calcium: 9.2 mg/dL (ref 8.4–10.5)
Chloride: 100 mmol/L (ref 98–110)
Creatinine: 0.4 mg/dL — ABNORMAL LOW (ref 0.5–1.2)
GFR African American: >60 (ref >60.0)
GFR Non-African American: >60 (ref >60.0)
Globulin: 2.7 g/dL (ref 2.0–4.0)
Glucose: 273 mg/dL — ABNORMAL HIGH (ref 70–99)
Potassium: 4.4 mmol/L (ref 3.5–5.5)
Sodium: 137 mmol/L (ref 133–145)
Total Bilirubin: 0.2 mg/dL (ref 0.2–1.2)
Total Protein: 7.1 g/dL (ref 6.4–8.3)

## 2018-11-05 LAB — CBC WITH AUTO DIFFERENTIAL
ABSOLUTE LYMPHOCYTE COUNT, 10803: 3.9 10*3/uL (ref 1.0–4.8)
Basophils %: 1 % (ref 0–2)
Basophils Absolute: 0.1 10*3/uL (ref 0.0–0.2)
Eosinophils %: 1 % (ref 0–6)
Eosinophils Absolute: 0.2 10*3/uL (ref 0.0–0.5)
Hematocrit: 46.8 % (ref 35.1–48.0)
Hemoglobin: 15 g/dL (ref 11.7–16.0)
Lymphocytes %: 32 % (ref 20–45)
MCH: 30 pg (ref 26–34)
MCHC: 32 g/dL (ref 31–36)
MCV: 94 fL (ref 81–99)
MPV: 11.1 fL (ref 9.0–13.0)
Monocytes %: 6 % (ref 3–12)
Monocytes Absolute: 0.8 10*3/uL (ref 0.1–1.0)
Neutrophils %: 60 % (ref 40–75)
Neutrophils Absolute: 7.3 10*3/uL (ref 1.8–7.7)
Platelets: 279 10*3/uL (ref 140–440)
RBC: 4.98 M/uL (ref 3.80–5.20)
RDW: 13.3 % (ref 10.0–15.5)
WBC: 12.4 10*3/uL — ABNORMAL HIGH (ref 4.0–11.0)

## 2018-11-05 LAB — MICROALBUMIN / CREATININE URINE RATIO
Creatinine, Ur: 83 mg/dL
Microalb, Ur: 13 mg/L (ref 0.1–17.0)
Microalbumin Creatinine Ratio: 15.7 NA (ref 0.0–30.0)

## 2018-11-05 MED ORDER — BETAMETHASONE ACET & SOD PHOS 6 MG/ML SUSP FOR INJECTION
6 mg/mL | Freq: Once | INTRAMUSCULAR | 0 refills | Status: AC
Start: 2018-11-05 — End: 2018-11-05

## 2018-11-05 MED ORDER — ALBUTEROL SULFATE HFA 90 MCG/ACTUATION AEROSOL INHALER
90 mcg/actuation | Freq: Four times a day (QID) | RESPIRATORY_TRACT | 3 refills | Status: DC | PRN
Start: 2018-11-05 — End: 2018-11-30

## 2018-11-05 NOTE — Progress Notes (Signed)
 Room #    Chief Complaint:  Pre-op clearance    HPI:    Barbara [name] is a 53 y.o. female who presents today for c/o pre-op clearance    1. Have you been to the ER, urgent care clinic since your last visit?  Hospitalized since your last visit?NO When:    2. Have you seen or consulted any other health care providers outside of the [facility] since your last visit?  Include any pap smears or colon screening.NO  When :  Reason:    Health Maintenance reviewed Yes    Health Maintenance Due   Topic Date Due   . PAP AKA CERVICAL CYTOLOGY  11/27/1988   . Shingrix Vaccine Age 19> (1 of 2) 11/27/2017   . FOBT Q 1 YEAR AGE 33-75  11/27/2017   . MICROALBUMIN Q1  01/29/2018   . Influenza Age 54 to Adult  05/14/2018   . LIPID PANEL Q1  05/15/2018   . HEMOGLOBIN A1C Q6M  08/26/2018

## 2018-11-05 NOTE — Progress Notes (Signed)
[name]  CHIEF COMPLAINT/HPI: Barbara [name] is a 53 y.o. presenting for preoperative evaluation. Plans to have Excision RIGHT Hand Mass on 11/09/2017.     Age <= 69?  No  Age > 40?  YES  Recent MI (<=6 weeks), unstable angina, decompensated CHF, significant arrhythmias with hemodynamic instability, severe vascular disease?  No  Previous MI (>6 weeks), mild stable angina, compensated CHF, DM?  Yes  Palpitations, Abnormal EKG, history of stroke, advanced age, low functional capacity, uncontrolled HTN? No, EKG 09/13/2017 WNL  Asthma?  No  COPD?  No  Cough? No  Dyspnea?  No  Smoking?  YES  Obesity/ BMI? No  Abdominal or Thoracic Surgery? No  Malnutrition? No  Complications with Anesthesia? YES, Details: Nausea  METS: >4    PAST MEDICAL HISTORY     Past Surgical History:   Procedure Laterality Date   ??? BIOPSY BREAST     ??? BIOPSY FINE NEEDLE     ??? HX BREAST AUGMENTATION Right 2010   ??? HX BREAST AUGMENTATION Left 09/17/2017    left breast reconstruction with exchange of tissue expander per breast prostesis and left breast tissue expander performed by [name], MD at [place]   ??? HX BREAST AUGMENTATION Right 04/01/2018    removal of tissue expanders with placment of right permanent breast prostesis performed by [name], MD at [place]   ??? HX BREAST RECONSTRUCTION Right 2011    per pt - attempted latissimus dorsi flap, then saline implant   ??? HX BREAST RECONSTRUCTION Bilateral 06/10/2017    right breast prosthesis and capsule removal and bilateral breast tissue  expander reconstruction.  performed by [name], MD at [place]   ??? HX BREAST RECONSTRUCTION Left 07/09/2017    left breast  EXCISION and closure of wound performed by [name], MD at [place]   ??? HX BREAST RECONSTRUCTION Bilateral     Tissue expanders, to prepare for implants    ??? HX CESAREAN SECTION     ??? HX LUMBAR DISKECTOMY      herniated disc   ??? HX MASTECTOMY Right 2010    Total mastectomy per pt.     ??? HX  MASTECTOMY Left 06/10/2017    left mastectomy,Sentinal node biopsies. performed by [name], MD at [facility] MA[facility]   ??? HX MASTECTOMY Bilateral    ??? HX MASTOPEXY (BREAST LIFT) Left 2011   ??? IMPLANT BREAST SILICONE/EQ Right 6045    post mastectomy   ??? THUMB SUPPORT      thumb surgery to mobilize thumb     Past Medical History:   Diagnosis Date   ??? Back pain    ??? Breast cancer ([place]) 2008 or 2009   ??? Chronic pain    ??? Diabetes ([place])    ??? FH: chemotherapy    ??? Hypertension    ??? Nausea & vomiting    ??? Neuropathy    ??? Radiation therapy complication 4098    NOT complication, just radiation     Current Outpatient Medications   Medication Sig Dispense Refill   ??? albuterol (PROVENTIL HFA, VENTOLIN HFA, PROAIR HFA) 90 mcg/actuation inhaler Take 2 Puffs by inhalation every six (6) hours as needed for Wheezing. 1 Inhaler 3   ??? gabapentin (NEURONTIN) 400 mg capsule 800 mg PO TID 270 Cap 3   ??? LORazepam (ATIVAN) 0.5 mg tablet Take 1 Tab by mouth every four (4) hours as needed for Anxiety. Max  Daily Amount: 3 mg. 30 Tab 0   ??? diclofenac EC (VOLTAREN) 75 mg EC tablet Take 1 Tab by mouth two (2) times a day. 30 Tab 3   ??? atorvastatin (LIPITOR) 10 mg tablet Take 1 Tab by mouth daily. 90 Tab 1   ??? venlafaxine-SR (EFFEXOR-XR) 75 mg capsule Take 2 Caps by mouth daily. 90 Cap 3   ??? insulin NPH/insulin regular (NOVOLIN 70/30, HUMULIN 70/30) 100 unit/mL (70-30) injection 35 Units Daily (before breakfast) AND 30 Units Daily (before dinner). 20 mL 3   ??? ramipril (ALTACE) 10 mg capsule Take 1 Cap by mouth daily. 90 Cap 3   ??? nystatin (MYCOSTATIN) powder Apply to rash 4 times a day 60 g 3   ??? ketoconazole (NIZORAL) 2 % topical cream Apply  to affected area two (2) times a day. 60 g 3   ??? letrozole (FEMARA) 2.5 mg tablet Take 2.5 mg by mouth daily.     ??? betamethasone (CELESTONE) 6 mg/mL injection 1 mL by Intra artICUlar route once for 1 dose. 1 Vial 0      Allergies:  Tramadol; Metformin; and Trulicity [dulaglutide]    I have reviewed the  Review of Systems and concur with the nursing personnel.     PHYSICAL EXAMINATION  General Appearance:  Alert and Interactive  Vital Signs:    Visit Vitals  BP 138/81 (BP 1 Location: Left arm, BP Patient Position: Sitting)   Pulse 71   Temp 97.4 ??F (36.3 ??C) (Oral)   Resp 19   Ht 5\' 2"  (1.575 m)   Wt 131 lb 9.6 oz (59.7 kg)   SpO2 94%   BMI 24.07 kg/m??      Eyes:  Conjunctiva/Lids: Normal Pupils/Irises: Normal   Neck: Normal Thyroid. No LNs.    Respiratory: End Expiratory wheeze. No rhonchi or rale   Cardiovascular: No murmurs. Normal S1, S2   Gastrointestinal: NTTP. Active bowel sounds.    Skin: No RASH   Neurologic: Grossly Normal     IMPRESSION:     ICD-10-CM ICD-9-CM    1. Mass of hand, right R22.31 782.2    2. Preoperative examination Z01.818 V72.84    3. Elevated alkaline phosphatase level R74.8 790.5 VITAMIN D, 25 HYDROXY      GGT   4. Smoking F17.200 305.1 XR CHEST PA LAT      albuterol (PROVENTIL HFA, VENTOLIN HFA, PROAIR HFA) 90 mcg/actuation inhaler     PLAN:   Clear for Surgery: Low and Intermediate Cardiovascular Risk  Refer for Cardiology Evaluation and Possible Coronary Angiography or Noninvasive Testing: No  Start Beta Blocker: No  Anticoagulation: No NSAID's or ASA  Herbal Medication: STOP MVI  Other Recommendations: Continue Neurontin, Lipitor, Effexor, and Femara. Higher Risk of VTE while on Femara. HOLD Ramipril AM of Surgery. 1/2 AM NPH Day of Surgery. Awaiting HbA1c and CBC 11/04/18. Will add Vitamin D and GGT for HIGH AP.  End expiratory wheeze, URI, will obtain CXR and start Trial of Albuterol, IF worsening symptoms prior to surgery, will notify OFFICE for further evaluation and postpone Surgery. Discussed Smoking Cessation treatment options and will defer. UPT Day of Surgery.      [name], DO   11/05/2018, 9:40 AM    Routine preoperative testing on healthy people undergoing elective surgery is NOT recommended. However, based on available evidence, below are recommended preoperative  guidelines:  1. Hemoglobin Level for major surgery with significant blood loss expected or in patients 65 years or older.  2. Serum Creatinine Level for people older than 50 years.  3. Pregnancy testing in all reproductive-age group women.  4. ECG in patients undergoing high risk surgery or intermediate-risk surgery and with at least one risk factor.  5. CXR in patients older than 60 years.     Cardiovascular Risk of Procedure:  High (>5%)- Aortic and Major Vascular Surgery, Peripheral Vascular Surgery.   Intermediate (1-5%)- Intraperitoneal or Intrathoracic Surgery, Carotid Endarterectomy, Head and Neck Surgery, Orthopedic Surgery, Prostate Surgery.   Low (<1%)- Ambulatory Surgery, Breast Surgery, Endoscopic Procedures, Superficial Procedures, Cataract Surgery.     Revised Cardiac Risk Index:  History of Cerebrovascular Disease- 0  CHF- 0  Preoperative Creatinine Level > 2.0- 0  IDDM- 1  Ischemic Cardiac Disease- 0  Suprainguinal Vascular, Intrathoracic, and Intra-abdominal Surgery- 0  Total: 1  Risk of Complications: <5%

## 2018-11-05 NOTE — Telephone Encounter (Signed)
Provider responded back.

## 2018-11-05 NOTE — Progress Notes (Addendum)
[name]  CHIEF COMPLAINT/HPI: Barbara [name] is a 53 y.o. presenting for preoperative evaluation. Plans to have Excision RIGHT Hand Mass on 11/09/2017.     Age <= 31?  No  Age > 40?  YES  Recent MI (<=6 weeks), unstable angina, decompensated CHF, significant arrhythmias with hemodynamic instability, severe vascular disease?  No  Previous MI (>6 weeks), mild stable angina, compensated CHF, DM?  Yes  Palpitations, Abnormal EKG, history of stroke, advanced age, low functional capacity, uncontrolled HTN? No, EKG 09/13/2017 WNL  Asthma?  No  COPD?  No  Cough? No  Dyspnea?  No  Smoking?  YES  Obesity/ BMI? No  Abdominal or Thoracic Surgery? No  Malnutrition? No  Complications with Anesthesia? YES, Details: Nausea  METS: >4    PAST MEDICAL HISTORY     Past Surgical History:   Procedure Laterality Date   ??? BIOPSY BREAST     ??? BIOPSY FINE NEEDLE     ??? HX BREAST AUGMENTATION Right 2010   ??? HX BREAST AUGMENTATION Left 09/17/2017    left breast reconstruction with exchange of tissue expander per breast prostesis and left breast tissue expander performed by [name], MD at [place]   ??? HX BREAST AUGMENTATION Right 04/01/2018    removal of tissue expanders with placment of right permanent breast prostesis performed by [name], MD at [place]   ??? HX BREAST RECONSTRUCTION Right 2011    per pt - attempted latissimus dorsi flap, then saline implant   ??? HX BREAST RECONSTRUCTION Bilateral 06/10/2017    right breast prosthesis and capsule removal and bilateral breast tissue  expander reconstruction.  performed by [name], MD at [place]   ??? HX BREAST RECONSTRUCTION Left 07/09/2017    left breast  EXCISION and closure of wound performed by [name], MD at [place]   ??? HX BREAST RECONSTRUCTION Bilateral     Tissue expanders, to prepare for implants    ??? HX CESAREAN SECTION     ??? HX LUMBAR DISKECTOMY      herniated disc   ??? HX MASTECTOMY Right 2010    Total mastectomy per pt.      ??? HX MASTECTOMY Left 06/10/2017    left mastectomy,Sentinal node biopsies. performed by [name], MD at [facility] MAIN OR   ??? HX MASTECTOMY Bilateral    ??? HX MASTOPEXY (BREAST LIFT) Left 2011   ??? IMPLANT BREAST SILICONE/EQ Right 7253    post mastectomy   ??? THUMB SUPPORT      thumb surgery to mobilize thumb     Past Medical History:   Diagnosis Date   ??? Back pain    ??? Breast cancer ([place]) 2008 or 2009   ??? Chronic pain    ??? Diabetes ([place])    ??? FH: chemotherapy    ??? Hypertension    ??? Nausea & vomiting    ??? Neuropathy    ??? Radiation therapy complication 6644    NOT complication, just radiation     Current Outpatient Medications   Medication Sig Dispense Refill   ??? albuterol (PROVENTIL HFA, VENTOLIN HFA, PROAIR HFA) 90 mcg/actuation inhaler Take 2 Puffs by inhalation every six (6) hours as needed for Wheezing. 1 Inhaler 3   ??? gabapentin (NEURONTIN) 400 mg capsule 800 mg PO TID 270 Cap 3   ??? LORazepam (ATIVAN) 0.5 mg tablet Take 1 Tab by mouth every four (4) hours as needed for Anxiety. Max  Daily Amount: 3 mg. 30 Tab 0   ??? diclofenac EC (VOLTAREN) 75 mg EC tablet Take 1 Tab by mouth two (2) times a day. 30 Tab 3   ??? atorvastatin (LIPITOR) 10 mg tablet Take 1 Tab by mouth daily. 90 Tab 1   ??? venlafaxine-SR (EFFEXOR-XR) 75 mg capsule Take 2 Caps by mouth daily. 90 Cap 3   ??? insulin NPH/insulin regular (NOVOLIN 70/30, HUMULIN 70/30) 100 unit/mL (70-30) injection 35 Units Daily (before breakfast) AND 30 Units Daily (before dinner). 20 mL 3   ??? ramipril (ALTACE) 10 mg capsule Take 1 Cap by mouth daily. 90 Cap 3   ??? nystatin (MYCOSTATIN) powder Apply to rash 4 times a day 60 g 3   ??? ketoconazole (NIZORAL) 2 % topical cream Apply  to affected area two (2) times a day. 60 g 3   ??? letrozole (FEMARA) 2.5 mg tablet Take 2.5 mg by mouth daily.     ??? betamethasone (CELESTONE) 6 mg/mL injection 1 mL by Intra artICUlar route once for 1 dose. 1 Vial 0      Allergies:  Tramadol; Metformin; and Trulicity [dulaglutide]     I have reviewed the Review of Systems and concur with the nursing personnel.     PHYSICAL EXAMINATION  General Appearance:  Alert and Interactive  Vital Signs:    Visit Vitals  BP 138/81 (BP 1 Location: Left arm, BP Patient Position: Sitting)   Pulse 71   Temp 97.4 ??F (36.3 ??C) (Oral)   Resp 19   Ht 5\' 2"  (1.575 m)   Wt 131 lb 9.6 oz (59.7 kg)   SpO2 94%   BMI 24.07 kg/m??      Eyes:  Conjunctiva/Lids: Normal Pupils/Irises: Normal   Neck: Normal Thyroid. No LNs.    Respiratory: End Expiratory wheeze. No rhonchi or rale   Cardiovascular: No murmurs. Normal S1, S2   Gastrointestinal: NTTP. Active bowel sounds.    Skin: No RASH   Neurologic: Grossly Normal     IMPRESSION:     ICD-10-CM ICD-9-CM    1. Mass of hand, right R22.31 782.2    2. Preoperative examination Z01.818 V72.84    3. Elevated alkaline phosphatase level R74.8 790.5 VITAMIN D, 25 HYDROXY      GGT   4. Smoking F17.200 305.1 XR CHEST PA LAT      albuterol (PROVENTIL HFA, VENTOLIN HFA, PROAIR HFA) 90 mcg/actuation inhaler     PLAN:   Clear for Surgery: Low and Intermediate Cardiovascular Risk  Refer for Cardiology Evaluation and Possible Coronary Angiography or Noninvasive Testing: No  Start Beta Blocker: No  Anticoagulation: No NSAID's or ASA  Herbal Medication: STOP MVI  Other Recommendations: Continue Neurontin, Lipitor, Effexor, and Femara. Higher Risk of VTE while on Femara. HOLD Ramipril AM of Surgery. 1/2 AM NPH Day of Surgery. Awaiting HbA1c and CBC 11/04/18. Will add Vitamin D and GGT for HIGH AP.  End expiratory wheeze, URI, will obtain CXR and start Trial of Albuterol, IF worsening symptoms prior to surgery, will notify OFFICE for further evaluation and postpone Surgery. Discussed Smoking Cessation treatment options and will defer. UPT Day of Surgery.      [name], DO   11/05/2018, 9:40 AM    Routine preoperative testing on healthy people undergoing elective surgery is NOT recommended. However, based on available evidence, below are  recommended preoperative guidelines:  1. Hemoglobin Level for major surgery with significant blood loss expected or in patients 65 years or older.  2. Serum Creatinine Level for people older than 50 years.  3. Pregnancy testing in all reproductive-age group women.  4. ECG in patients undergoing high risk surgery or intermediate-risk surgery and with at least one risk factor.  5. CXR in patients older than 60 years.     Cardiovascular Risk of Procedure:  High (>5%)- Aortic and Major Vascular Surgery, Peripheral Vascular Surgery.   Intermediate (1-5%)- Intraperitoneal or Intrathoracic Surgery, Carotid Endarterectomy, Head and Neck Surgery, Orthopedic Surgery, Prostate Surgery.   Low (<1%)- Ambulatory Surgery, Breast Surgery, Endoscopic Procedures, Superficial Procedures, Cataract Surgery.     Revised Cardiac Risk Index:  History of Cerebrovascular Disease- 0  CHF- 0  Preoperative Creatinine Level > 2.0- 0  IDDM- 1  Ischemic Cardiac Disease- 0  Suprainguinal Vascular, Intrathoracic, and Intra-abdominal Surgery- 0  Total: 1  Risk of Complications: <0%

## 2018-11-05 NOTE — Progress Notes (Signed)
Room #    Chief Complaint:  Pre-op clearance    HPI:    Barbara [name] is a 53 y.o. female who presents today for c/o pre-op clearance    1. Have you been to the ER, urgent care clinic since your last visit?  Hospitalized since your last visit?NO When:    2. Have you seen or consulted any other health care providers outside of the [facility] since your last visit?  Include any pap smears or colon screening.NO  When :  Reason:    Health Maintenance reviewed Yes    Health Maintenance Due   Topic Date Due   ??? PAP AKA CERVICAL CYTOLOGY  11/27/1988   ??? Shingrix Vaccine Age 42> (1 of 2) 11/27/2017   ??? FOBT Q 1 YEAR AGE 74-75  11/27/2017   ??? MICROALBUMIN Q1  01/29/2018   ??? Influenza Age 68 to Adult  05/14/2018   ??? LIPID PANEL Q1  05/15/2018   ??? HEMOGLOBIN A1C Q6M  08/26/2018

## 2018-11-05 NOTE — Telephone Encounter (Signed)
Called Barbara [name] to let her know that CT of the Abdomen shows No mass, ONLY Non-specific calcification, and Cholelithiasis (usually of undissolved cholesterol). IF she does NOT have RUQ Abdominal Pain, Nausea, Vomiting, etc. We will continue to Monitor.Left a message for her to contact the office at her earliest convenience.

## 2018-11-05 NOTE — Telephone Encounter (Signed)
For the 10/23/2018 injection, billing would like to know: can you ask him if it was a joint injection and needs to add the admin code for that he didn't order that either. Please advise.

## 2018-11-06 ENCOUNTER — Inpatient Hospital Stay: Admit: 2018-11-06 | Payer: PRIVATE HEALTH INSURANCE | Primary: Nurse Practitioner

## 2018-11-06 DIAGNOSIS — F17200 Nicotine dependence, unspecified, uncomplicated: Secondary | ICD-10-CM

## 2018-11-06 LAB — GGT
GGT: 46 U/L (ref 5–60)
GGT: 46 U/L (ref 5–60)

## 2018-11-06 LAB — VITAMIN D, 25 HYDROXY: VITAMIN D, 25-HYDROXY: 28.2 ng/mL — ABNORMAL LOW (ref 32.0–100.0)

## 2018-11-06 LAB — VITAMIN D 25 HYDROXY: Vit D, 25-Hydroxy: 28.2 ng/mL — ABNORMAL LOW (ref 32.0–100.0)

## 2018-11-06 NOTE — Telephone Encounter (Signed)
Pt called in and stated she wants a phone call back from the nurse in regards to her lab work. Please advise.

## 2018-11-06 NOTE — Telephone Encounter (Signed)
Please let Mrs. Steier know that CXR WNL. Thank you.

## 2018-11-09 ENCOUNTER — Encounter: Payer: PRIVATE HEALTH INSURANCE | Primary: Nurse Practitioner

## 2018-11-09 NOTE — Telephone Encounter (Signed)
Called Mrs. Tufano to let her know that CXR WNL. No other concerns closing encounter

## 2018-11-11 ENCOUNTER — Encounter: Payer: PRIVATE HEALTH INSURANCE | Primary: Nurse Practitioner

## 2018-11-11 NOTE — Telephone Encounter (Signed)
Laniyah from [facility] need an updated referral back dating to 03/02/2018. She stated pt. Was seen on 03/03/2018 and 03/16/18. Please assist.

## 2018-11-11 NOTE — Telephone Encounter (Signed)
Patient needs updated referral to [facility]

## 2018-11-13 ENCOUNTER — Inpatient Hospital Stay
Admit: 2018-11-13 | Discharge: 2018-11-13 | Disposition: A | Discharge status: Home / Self Care | Payer: PRIVATE HEALTH INSURANCE | Attending: Emergency Medicine

## 2018-11-13 DIAGNOSIS — H00011 Hordeolum externum right upper eyelid: Secondary | ICD-10-CM

## 2018-11-13 MED ORDER — IBUPROFEN 600 MG TAB
600 mg | ORAL_TABLET | Freq: Four times a day (QID) | ORAL | 0 refills | Status: DC | PRN
Start: 2018-11-13 — End: 2019-06-09

## 2018-11-13 MED ORDER — PROPARACAINE 0.5 % EYE DROPS
0.5 % | OPHTHALMIC | Status: DC
Start: 2018-11-13 — End: 2018-11-13

## 2018-11-13 MED ORDER — FLUORESCEIN 1 MG EYE STRIPS
1 mg | OPHTHALMIC | Status: DC
Start: 2018-11-13 — End: 2018-11-13

## 2018-11-13 MED FILL — PROPARACAINE 0.5 % EYE DROPS: 0.5 % | OPHTHALMIC | Qty: 15

## 2018-11-13 MED FILL — BIOGLO 1 MG EYE STRIPS: 1 mg | OPHTHALMIC | Qty: 1

## 2018-11-13 NOTE — ED Provider Notes (Signed)
ED Provider Notes by Meade Maw at 11/13/18 1156                Author: Meade Maw  Service: Emergency Medicine  Author Type: Physician Assistant       Filed: 11/13/18 1249  Date of Service: 11/13/18 1156  Status: Attested           Editor: Meade Maw  Cosigner: Meredith Pel, MD at 11/13/18 1509            Procedure Orders        1. Eye Stain [[id number]] ordered by Meade Maw, PA-C                         Attestation signed by Meredith Pel, MD at 11/13/18 [phone]          I was personally available for consultation in the emergency department. I personally did not see the patient. I have reviewed the chart and the documentation  recorded by the [facility], including the assessment, treatment plan, and disposition.   Meredith Pel, MD                                    EMERGENCY DEPARTMENT HISTORY AND PHYSICAL EXAM      11:56 AM         Date: 11/13/2018   Patient Name: Barbara [name]        History of Presenting Illness          Chief Complaint       Patient presents with        ?  Eye Pain        ?  Headache              History Provided By: Patient      Additional History (Context): Rex Lyn Frankland  is a 53 y.o. female with  diabetes and hypertension who presents with complaints of right eye pain for the last 2 days.  Patient reports that she has a long history of styes, states that she is noticed redness to her right eyelid  with what looks like a pimple and associated right eye pain.  Patient denies any visual acuity changes.  But does state it feels like something is scratching her eye.  Patient also reports associated headache with the pain.      PCP: [name], DO        Current Facility-Administered Medications             Medication  Dose  Route  Frequency  Provider  Last Rate  Last Dose              ?  proparacaine (OPTHAINE) 0.5 % ophthalmic solution 1 Drop   1 Drop  Both Eyes  NOW  Amiri Tritch K, PA-C                    ?  fluorescein (FUL-GLO) 1 mg  ophthalmic strip 1 Strip   1 Strip  Both Eyes  NOW  Meade Maw, PA-C                Current Outpatient Medications          Medication  Sig  Dispense  Refill           ?  ibuprofen (MOTRIN) 600 mg  tablet  Take 1 Tab by mouth every six (6) hours as needed for Pain.  20 Tab  0           ?  albuterol (PROVENTIL HFA, VENTOLIN HFA, PROAIR HFA) 90 mcg/actuation inhaler  Take 2 Puffs by inhalation every six (6) hours as needed for Wheezing.  1 Inhaler  3     ?  gabapentin (NEURONTIN) 400 mg capsule  800 mg PO TID  270 Cap  3     ?  LORazepam (ATIVAN) 0.5 mg tablet  Take 1 Tab by mouth every four (4) hours as needed for Anxiety. Max Daily Amount: 3 mg.  30 Tab  0     ?  diclofenac EC (VOLTAREN) 75 mg EC tablet  Take 1 Tab by mouth two (2) times a day.  30 Tab  3     ?  atorvastatin (LIPITOR) 10 mg tablet  Take 1 Tab by mouth daily.  90 Tab  1     ?  venlafaxine-SR (EFFEXOR-XR) 75 mg capsule  Take 2 Caps by mouth daily.  90 Cap  3     ?  insulin NPH/insulin regular (NOVOLIN 70/30, HUMULIN 70/30) 100 unit/mL (70-30) injection  35 Units Daily (before breakfast) AND 30 Units Daily (before dinner).  20 mL  3     ?  ramipril (ALTACE) 10 mg capsule  Take 1 Cap by mouth daily.  90 Cap  3     ?  nystatin (MYCOSTATIN) powder  Apply to rash 4 times a day  60 g  3     ?  ketoconazole (NIZORAL) 2 % topical cream  Apply  to affected area two (2) times a day.  60 g  3           ?  letrozole (FEMARA) 2.5 mg tablet  Take 2.5 mg by mouth daily.                 Past History        Past Medical History:     Past Medical History:        Diagnosis  Date         ?  Back pain       ?  Breast cancer ([place])  2008 or 2009     ?  Chronic pain       ?  Diabetes ([place])       ?  FH: chemotherapy       ?  Hypertension       ?  Nausea & vomiting       ?  Neuropathy       ?  Radiation therapy complication  1914          NOT complication, just radiation           Past Surgical History:     Past Surgical History:         Procedure  Laterality  Date           ?  BIOPSY BREAST         ?  BIOPSY FINE NEEDLE         ?  HX BREAST AUGMENTATION  Right  2010     ?  HX BREAST AUGMENTATION  Left  09/17/2017          left breast reconstruction with exchange of tissue expander per breast prostesis and left breast tissue expander performed by [name],  MD at  [facility]          ?  HX BREAST AUGMENTATION  Right  04/01/2018          removal of tissue expanders with placment of right permanent breast prostesis performed by [name], MD at [place]          ?  HX BREAST RECONSTRUCTION  Right  2011          per pt - attempted latissimus dorsi flap, then saline implant          ?  HX BREAST RECONSTRUCTION  Bilateral  06/10/2017          right breast prosthesis and capsule removal and bilateral breast tissue  expander reconstruction.  performed by [name], MD at [facility]          ?  HX BREAST RECONSTRUCTION  Left  07/09/2017          left breast  EXCISION and closure of wound performed by [name], MD at [facility]          ?  HX BREAST RECONSTRUCTION  Bilateral            Tissue expanders, to prepare for implants           ?  HX CESAREAN SECTION         ?  HX LUMBAR DISKECTOMY              herniated disc          ?  HX MASTECTOMY  Right  2010          Total mastectomy per pt.            ?  HX MASTECTOMY  Left  06/10/2017          left mastectomy,Sentinal node biopsies. performed by [name], MD at [facility]          ?  HX MASTECTOMY  Bilateral       ?  HX MASTOPEXY (BREAST LIFT)  Left  2011     ?  IMPLANT BREAST SILICONE/EQ  Right  0258          post mastectomy          ?  THUMB SUPPORT              thumb surgery to mobilize thumb           Family History:     Family History         Problem  Relation  Age of Onset          ?  Diabetes  Mother            ?  No Known Problems  Father             Social History:     Social History          Tobacco Use         ?  Smoking status:  Current Every Day Smoker              Packs/day:  0.50          Years:  15.00         Pack years:  7.50         ?  Smokeless tobacco:  Never Used        ?  Tobacco comment: 5-6 cigs per day patient  education given       Substance Use Topics         ?  Alcohol use:  No         ?  Drug use:  No           Allergies:     Allergies        Allergen  Reactions         ?  Tramadol  Anaphylaxis     ?  Metformin  Diarrhea         ?  Trulicity [Dulaglutide]  Nausea Only                Review of Systems           Review of Systems    Constitutional: Negative for chills, diaphoresis, fatigue and fever.    HENT: Negative.     Eyes: Positive for pain, redness  and itching. Negative for photophobia, discharge and visual disturbance.    Respiratory: Negative.     Cardiovascular: Negative.     Gastrointestinal: Negative.     Neurological: Negative.     [name] other systems reviewed and are negative.              Physical Exam        Visit Vitals      BP  (!) 155/101 (BP 1 Location: Right arm, BP Patient Position: Sitting)     Pulse  88     Temp  98.4 ??F (36.9 ??C)     Resp  20     Ht  _0  (1.575 m)     Wt  59.4 kg (131 lb)     SpO2  98%        BMI  23.96 kg/m??              Physical Exam   Vitals signs reviewed.   Constitutional:        General: She is not in acute distress.     Appearance: Normal appearance. She is normal weight. She is not ill-appearing, toxic-appearing or diaphoretic.   HENT:       Head: Normocephalic and atraumatic.      Right Ear: External ear normal.      Left Ear: External ear normal.      Nose: Nose normal.      Mouth/Throat:      Mouth: Mucous membranes are moist.      Pharynx: Oropharynx is clear.   Eyes:       General: Lids are normal. Lids are everted, no foreign bodies appreciated. Vision grossly intact. Gaze aligned appropriately. No scleral icterus.        Right eye: No discharge.         Left eye: No discharge.      Extraocular Movements:  Extraocular movements intact.      Conjunctiva/sclera: Conjunctivae normal.      Pupils: Pupils are equal, round, and  reactive to light.          Comments: Stye noted to right upper eyelid.  There is no associated periorbital cellulitis or edema.  Visual acuity grossly intact.    Neck :       Musculoskeletal: Neck supple.   Cardiovascular :       Rate and Rhythm: Normal rate and regular rhythm.      Pulses: Normal pulses.      Heart sounds: No murmur. No gallop.     Pulmonary:  Effort: Pulmonary effort is normal.      Breath sounds: No wheezing, rhonchi or rales.   Skin :      General: Skin is warm and dry.      Capillary Refill: Capillary refill takes less than 2 seconds.    Neurological:       General: No focal deficit present.      Mental Status: She is alert and oriented to person, place, and time.      Cranial Nerves: No cranial nerve deficit.                    Diagnostic Study Results        Labs -   No results found for this or any previous visit (from the past 12 hour(s)).      Radiologic Studies -      No orders to display                Medical Decision Making     I am the first provider for this patient.      I reviewed the vital signs, available nursing notes, past medical history, past surgical history, family history and social history.      Vital Signs-Reviewed the patient's vital signs.      Records Reviewed: Nursing Notes and Old Medical Records (Time of Review: 11:56 AM)      ED Course: Progress Notes, Reevaluation, and Consults:   11:56 AM  Met with patient, reviewed history, performed physical exam.  Will perform a fluorescein stain to rule out any corneal abrasion.  Will also perform visual acuity check.      Procedures:   Eye Stain     Date/Time: 11/13/2018 12:45 PM      Performed by: PA            Corneal abrasion was not present on eyelid eversion.         Cornea is clear.   Anterior chamber is clear.      Patient tolerance: Patient tolerated the procedure well with no immediate complications   My total time at bedside, performing this procedure was 1-15 minutes.  Comments: No evidence of fluorescein  uptake upon eye exam.     Provider Notes (Medical Decision Making):    53 year old female presents to the emergency department with complaints of right eye pain x2 days.  Physical exam reveals a stye to the right upper eyelid.  Fluorescein stain completed as documented above, no evidence of corneal abrasion.  No foreign  bodies noted on physical exam.  Visual acuity remains grossly intact.  Patient advised to continue symptomatic treatment with warm compresses and ibuprofen.  Patient advised to follow-up with an ophthalmologist soon as possible.  Patient advised to follow-up  with her primary care provider soon as possible.  Patient advised to return immediately to the emergency department if symptoms worsen.        Diagnosis        Clinical Impression:       1.  Hordeolum externum of right upper eyelid            Disposition: home         Follow-up Information               Follow up With  Specialties  Details  Why  Contact Info              [name], DO  Family Practice  Call in  1 day  For follow up regarding ER visit.  [address]   [address]   [place] [address]   [phone]                 [facility] EMERGENCY DEPT  Emergency Medicine    Immediately if symptoms worsen.  [place]                   Patient's Medications       Start Taking           IBUPROFEN (MOTRIN) 600 MG TABLET     Take 1 Tab by mouth every six (6) hours as needed for Pain.       Continue Taking           ALBUTEROL (PROVENTIL HFA, VENTOLIN HFA, PROAIR HFA) 90 MCG/ACTUATION INHALER     Take 2 Puffs by inhalation every six (6) hours as needed for Wheezing.           ATORVASTATIN (LIPITOR) 10 MG TABLET     Take 1 Tab by mouth daily.           DICLOFENAC EC (VOLTAREN) 75 MG EC TABLET     Take 1 Tab by mouth two (2) times a day.           GABAPENTIN (NEURONTIN) 400 MG CAPSULE     800 mg PO TID           INSULIN NPH/INSULIN REGULAR (NOVOLIN 70/30, HUMULIN 70/30) 100 UNIT/ML (70-30) INJECTION      35 Units Daily (before breakfast) AND 30 Units Daily (before dinner).           KETOCONAZOLE (NIZORAL) 2 % TOPICAL CREAM     Apply  to affected area two (2) times a day.           LETROZOLE (FEMARA) 2.5 MG TABLET     Take 2.5 mg by mouth daily.           LORAZEPAM (ATIVAN) 0.5 MG TABLET     Take 1 Tab by mouth every four (4) hours as needed for Anxiety. Max Daily Amount: 3 mg.           NYSTATIN (MYCOSTATIN) POWDER     Apply to rash 4 times a day           RAMIPRIL (ALTACE) 10 MG CAPSULE     Take 1 Cap by mouth daily.           VENLAFAXINE-SR (EFFEXOR-XR) 75 MG CAPSULE     Take 2 Caps by mouth daily.       These Medications have changed          No medications on file       Stop Taking          No medications on file        Meade Maw, PA-C      Dictation disclaimer:  Please note that this dictation was completed with Dragon, the computer voice recognition software.  Quite often unanticipated grammatical, syntax, homophones, and other  interpretive errors are inadvertently transcribed by the computer software.  Please disregard these errors.  Please excuse any errors that have escaped final proofreading.

## 2018-11-13 NOTE — ED Notes (Signed)
I have reviewed discharge instructions with the patient.  The patient verbalized understanding.  Discharge medications reviewed with patient and appropriate educational materials and side effects teaching were provided.

## 2018-11-13 NOTE — ED Notes (Signed)
Pt arrives with 3 days of right eye pain. Pt states she been putting in visine and warm compresses without improvement. Pt states tearing a lot and a headache with the pain.

## 2018-11-13 NOTE — ED Provider Notes (Signed)
EMERGENCY DEPARTMENT HISTORY AND PHYSICAL EXAM    11:56 AM      Date: 11/13/2018  Patient Name: Barbara [name]    History of Presenting Illness     Chief Complaint   Patient presents with   ??? Eye Pain   ??? Headache         History Provided By: Patient    Additional History (Context): Barbara [name] is a 53 y.o. female with diabetes and hypertension who presents with complaints of right eye pain for the last 2 days.  Patient reports that she has a long history of styes, states that she is noticed redness to her right eyelid with what looks like a pimple and associated right eye pain.  Patient denies any visual acuity changes.  But does state it feels like something is scratching her eye.  Patient also reports associated headache with the pain.    PCP: [name], DO    Current Facility-Administered Medications   Medication Dose Route Frequency Provider Last Rate Last Dose   ??? proparacaine (OPTHAINE) 0.5 % ophthalmic solution 1 Drop  1 Drop Both Eyes NOW Zakiah Beckerman K, PA-C       ??? fluorescein (FUL-GLO) 1 mg ophthalmic strip 1 Strip  1 Strip Both Eyes NOW Meade Maw, PA-C         Current Outpatient Medications   Medication Sig Dispense Refill   ??? ibuprofen (MOTRIN) 600 mg tablet Take 1 Tab by mouth every six (6) hours as needed for Pain. 20 Tab 0   ??? albuterol (PROVENTIL HFA, VENTOLIN HFA, PROAIR HFA) 90 mcg/actuation inhaler Take 2 Puffs by inhalation every six (6) hours as needed for Wheezing. 1 Inhaler 3   ??? gabapentin (NEURONTIN) 400 mg capsule 800 mg PO TID 270 Cap 3   ??? LORazepam (ATIVAN) 0.5 mg tablet Take 1 Tab by mouth every four (4) hours as needed for Anxiety. Max Daily Amount: 3 mg. 30 Tab 0   ??? diclofenac EC (VOLTAREN) 75 mg EC tablet Take 1 Tab by mouth two (2) times a day. 30 Tab 3   ??? atorvastatin (LIPITOR) 10 mg tablet Take 1 Tab by mouth daily. 90 Tab 1   ??? venlafaxine-SR (EFFEXOR-XR) 75 mg capsule Take 2 Caps by mouth daily. 90 Cap 3    ??? insulin NPH/insulin regular (NOVOLIN 70/30, HUMULIN 70/30) 100 unit/mL (70-30) injection 35 Units Daily (before breakfast) AND 30 Units Daily (before dinner). 20 mL 3   ??? ramipril (ALTACE) 10 mg capsule Take 1 Cap by mouth daily. 90 Cap 3   ??? nystatin (MYCOSTATIN) powder Apply to rash 4 times a day 60 g 3   ??? ketoconazole (NIZORAL) 2 % topical cream Apply  to affected area two (2) times a day. 60 g 3   ??? letrozole (FEMARA) 2.5 mg tablet Take 2.5 mg by mouth daily.         Past History     Past Medical History:  Past Medical History:   Diagnosis Date   ??? Back pain    ??? Breast cancer ([place]) 2008 or 2009   ??? Chronic pain    ??? Diabetes ([place])    ??? FH: chemotherapy    ??? Hypertension    ??? Nausea & vomiting    ??? Neuropathy    ??? Radiation therapy complication 3664    NOT complication, just radiation       Past Surgical History:  Past Surgical History:   Procedure Laterality  Date   ??? BIOPSY BREAST     ??? BIOPSY FINE NEEDLE     ??? HX BREAST AUGMENTATION Right 2010   ??? HX BREAST AUGMENTATION Left 09/17/2017    left breast reconstruction with exchange of tissue expander per breast prostesis and left breast tissue expander performed by [name], MD at [place]   ??? HX BREAST AUGMENTATION Right 04/01/2018    removal of tissue expanders with placment of right permanent breast prostesis performed by [name], MD at [place]   ??? HX BREAST RECONSTRUCTION Right 2011    per pt - attempted latissimus dorsi flap, then saline implant   ??? HX BREAST RECONSTRUCTION Bilateral 06/10/2017    right breast prosthesis and capsule removal and bilateral breast tissue  expander reconstruction.  performed by [name], MD at [place]   ??? HX BREAST RECONSTRUCTION Left 07/09/2017    left breast  EXCISION and closure of wound performed by [name], MD at [place]   ??? HX BREAST RECONSTRUCTION Bilateral     Tissue expanders, to prepare for implants    ??? HX CESAREAN SECTION     ??? HX LUMBAR DISKECTOMY       herniated disc   ??? HX MASTECTOMY Right 2010    Total mastectomy per pt.     ??? HX MASTECTOMY Left 06/10/2017    left mastectomy,Sentinal node biopsies. performed by [name], MD at [facility] MAIN OR   ??? HX MASTECTOMY Bilateral    ??? HX MASTOPEXY (BREAST LIFT) Left 2011   ??? IMPLANT BREAST SILICONE/EQ Right 1610    post mastectomy   ??? THUMB SUPPORT      thumb surgery to mobilize thumb       Family History:  Family History   Problem Relation Age of Onset   ??? Diabetes Mother    ??? No Known Problems Father        Social History:  Social History     Tobacco Use   ??? Smoking status: Current Every Day Smoker     Packs/day: 0.50     Years: 15.00     Pack years: 7.50   ??? Smokeless tobacco: Never Used   ??? Tobacco comment: 5-6 cigs per day patient education given   Substance Use Topics   ??? Alcohol use: No   ??? Drug use: No       Allergies:  Allergies   Allergen Reactions   ??? Tramadol Anaphylaxis   ??? Metformin Diarrhea   ??? Trulicity [Dulaglutide] Nausea Only         Review of Systems       Review of Systems   Constitutional: Negative for chills, diaphoresis, fatigue and fever.   HENT: Negative.    Eyes: Positive for pain, redness and itching. Negative for photophobia, discharge and visual disturbance.   Respiratory: Negative.    Cardiovascular: Negative.    Gastrointestinal: Negative.    Neurological: Negative.    [name] other systems reviewed and are negative.        Physical Exam     Visit Vitals  BP (!) 155/101 (BP 1 Location: Right arm, BP Patient Position: Sitting)   Pulse 88   Temp 98.4 ??F (36.9 ??C)   Resp 20   Ht 5' 2"  (1.575 m)   Wt 59.4 kg (131 lb)   SpO2 98%   BMI 23.96 kg/m??         Physical Exam  Vitals signs  reviewed.   Constitutional:       General: She is not in acute distress.     Appearance: Normal appearance. She is normal weight. She is not ill-appearing, toxic-appearing or diaphoretic.   HENT:      Head: Normocephalic and atraumatic.      Right Ear: External ear normal.      Left Ear: External ear normal.       Nose: Nose normal.      Mouth/Throat:      Mouth: Mucous membranes are moist.      Pharynx: Oropharynx is clear.   Eyes:      General: Lids are normal. Lids are everted, no foreign bodies appreciated. Vision grossly intact. Gaze aligned appropriately. No scleral icterus.        Right eye: No discharge.         Left eye: No discharge.      Extraocular Movements: Extraocular movements intact.      Conjunctiva/sclera: Conjunctivae normal.      Pupils: Pupils are equal, round, and reactive to light.        Comments: Stye noted to right upper eyelid.  There is no associated periorbital cellulitis or edema.  Visual acuity grossly intact.   Neck:      Musculoskeletal: Neck supple.   Cardiovascular:      Rate and Rhythm: Normal rate and regular rhythm.      Pulses: Normal pulses.      Heart sounds: No murmur. No gallop.    Pulmonary:      Effort: Pulmonary effort is normal.      Breath sounds: No wheezing, rhonchi or rales.   Skin:     General: Skin is warm and dry.      Capillary Refill: Capillary refill takes less than 2 seconds.   Neurological:      General: No focal deficit present.      Mental Status: She is alert and oriented to person, place, and time.      Cranial Nerves: No cranial nerve deficit.             Diagnostic Study Results     Labs -  No results found for this or any previous visit (from the past 12 hour(s)).    Radiologic Studies -   No orders to display         Medical Decision Making   I am the first provider for this patient.    I reviewed the vital signs, available nursing notes, past medical history, past surgical history, family history and social history.    Vital Signs-Reviewed the patient's vital signs.    Records Reviewed: Nursing Notes and Old Medical Records (Time of Review: 11:56 AM)    ED Course: Progress Notes, Reevaluation, and Consults:  11:56 AM  Met with patient, reviewed history, performed physical exam.  Will perform a fluorescein stain to rule out any corneal abrasion.  Will  also perform visual acuity check.    Procedures:  Eye Stain    Date/Time: 11/13/2018 12:45 PM    Performed by: PA        Corneal abrasion was not present on eyelid eversion.      Cornea is clear.  Anterior chamber is clear.    Patient tolerance: Patient tolerated the procedure well with no immediate complications  My total time at bedside, performing this procedure was 1-15 minutes.  Comments: No evidence of fluorescein uptake upon eye exam.    Provider Notes (Medical  Decision Making):   53 year old female presents to the emergency department with complaints of right eye pain x2 days.  Physical exam reveals a stye to the right upper eyelid.  Fluorescein stain completed as documented above, no evidence of corneal abrasion.  No foreign bodies noted on physical exam.  Visual acuity remains grossly intact.  Patient advised to continue symptomatic treatment with warm compresses and ibuprofen.  Patient advised to follow-up with an ophthalmologist soon as possible.  Patient advised to follow-up with her primary care provider soon as possible.  Patient advised to return immediately to the emergency department if symptoms worsen.    Diagnosis     Clinical Impression:   1. Hordeolum externum of right upper eyelid        Disposition: home     Follow-up Information     Follow up With Specialties Details Why Contact Info    [name], DO Family Practice Call in 1 day For follow up regarding ER visit. [address]  [address]  [place] [address]  [phone]      [facility] EMERGENCY DEPT Emergency Medicine  Immediately if symptoms worsen. [place]           Patient's Medications   Start Taking    IBUPROFEN (MOTRIN) 600 MG TABLET    Take 1 Tab by mouth every six (6) hours as needed for Pain.   Continue Taking    ALBUTEROL (PROVENTIL HFA, VENTOLIN HFA, PROAIR HFA) 90 MCG/ACTUATION INHALER    Take 2 Puffs by inhalation every six (6) hours as needed for Wheezing.     ATORVASTATIN (LIPITOR) 10 MG TABLET    Take 1 Tab by mouth daily.    DICLOFENAC EC (VOLTAREN) 75 MG EC TABLET    Take 1 Tab by mouth two (2) times a day.    GABAPENTIN (NEURONTIN) 400 MG CAPSULE    800 mg PO TID    INSULIN NPH/INSULIN REGULAR (NOVOLIN 70/30, HUMULIN 70/30) 100 UNIT/ML (70-30) INJECTION    35 Units Daily (before breakfast) AND 30 Units Daily (before dinner).    KETOCONAZOLE (NIZORAL) 2 % TOPICAL CREAM    Apply  to affected area two (2) times a day.    LETROZOLE (FEMARA) 2.5 MG TABLET    Take 2.5 mg by mouth daily.    LORAZEPAM (ATIVAN) 0.5 MG TABLET    Take 1 Tab by mouth every four (4) hours as needed for Anxiety. Max Daily Amount: 3 mg.    NYSTATIN (MYCOSTATIN) POWDER    Apply to rash 4 times a day    RAMIPRIL (ALTACE) 10 MG CAPSULE    Take 1 Cap by mouth daily.    VENLAFAXINE-SR (EFFEXOR-XR) 75 MG CAPSULE    Take 2 Caps by mouth daily.   These Medications have changed    No medications on file   Stop Taking    No medications on file     Meade Maw, PA-C    Dictation disclaimer:  Please note that this dictation was completed with Dragon, the computer voice recognition software.  Quite often unanticipated grammatical, syntax, homophones, and other interpretive errors are inadvertently transcribed by the computer software.  Please disregard these errors.  Please excuse any errors that have escaped final proofreading.

## 2018-11-13 NOTE — ED Triage Notes (Signed)
Pt arrives with 3 days of right eye pain. Pt states she been putting in visine and warm compresses without improvement. Pt states tearing a lot and a headache with the pain.

## 2018-11-13 NOTE — ED Notes (Signed)
I have reviewed discharge instructions with the patient.  The patient verbalized understanding.  Discharge medications reviewed with patient and appropriate educational materials and side effects teaching were provided.

## 2018-11-15 ENCOUNTER — Telehealth

## 2018-11-15 MED ORDER — ERGOCALCIFEROL (VITAMIN D2) 50,000 UNIT CAP
1250 mcg (50,000 unit) | ORAL_CAPSULE | ORAL | 0 refills | Status: DC
Start: 2018-11-15 — End: 2018-11-17

## 2018-11-15 MED ORDER — INSULIN NPH HUMAN RECOMB 100 UNIT/ML INJECTION
100 unit/mL | SUBCUTANEOUS | 5 refills | Status: DC
Start: 2018-11-15 — End: 2019-06-01

## 2018-11-15 NOTE — Telephone Encounter (Signed)
Vitamin D-28.2, WBC-12.4, HbA1c-11.6%. Recommend Vitamin D 50000 Units Qweekly for 2 months then obtain Vitamin D Level. Will repeat CBC and add ESR, CRF, Peripheral Smear, Reticulocyte Count and LDH. Any FEVER, Weight Loss? Leukocytosis maybe secondary to Medication and will Monitor. Also, advise Titrate NPH Insulin 40 Units in the AM and 35 in the PM, keep Log. See Orders. Thank you. Follow Up in OFFICE in 3 months.

## 2018-11-16 NOTE — Telephone Encounter (Signed)
Received call from [facility] for medication clarification for ergocalciferol (ERGOCALCIFEROL) 1,250 mcg (50,000 unit) capsule. 1 tab po a week for 8 weeks. No other concerns at this time. Closing encounter

## 2018-11-16 NOTE — Telephone Encounter (Signed)
Called patient to let her know her lab results as follows:  Vitamin D-28.2, WBC-12.4, HbA1c-11.6%. Recommend Vitamin D 50000 Units Qweekly for 2 months then obtain Vitamin D Level. Will repeat CBC and add ESR, CRF, Peripheral Smear, Reticulocyte Count and LDH.   Any FEVER, Weight Loss? NO  Leukocytosis maybe secondary to Medication and will Monitor. Also, advise Titrate NPH Insulin 40 Units in the AM and 35 in the PM, keep Log. Follow Up in OFFICE in 3 months. Patient was out of tow and will do labs and follow up when she returns. Closing encounter

## 2018-11-17 ENCOUNTER — Encounter

## 2018-11-17 MED ORDER — CHOLECALCIFEROL (VITAMIN D3) 2,000 UNIT CAPSULE
ORAL_CAPSULE | Freq: Every day | ORAL | 3 refills | Status: DC
Start: 2018-11-17 — End: 2019-02-15

## 2018-11-17 NOTE — Telephone Encounter (Signed)
Patient stated med is too expensive, needs something different. Please advise, thank you.       Med: ergocalciferol (ERGOCALCIFEROL) 1,250 mcg

## 2018-11-18 NOTE — Telephone Encounter (Signed)
Received call from patient for assistance to make sure that she got correct insulin from pharmacy. Novolin

## 2018-11-18 NOTE — Telephone Encounter (Signed)
Called patient to let her know that Dr. Tamala Julian has sent a prescription for Vitamin D 2000 Units PO every day as a supplement. No other concerns closing encounter

## 2018-11-30 ENCOUNTER — Encounter

## 2018-12-01 MED ORDER — ALBUTEROL SULFATE HFA 90 MCG/ACTUATION AEROSOL INHALER
90 mcg/actuation | Freq: Four times a day (QID) | RESPIRATORY_TRACT | 3 refills | Status: AC | PRN
Start: 2018-12-01 — End: ?

## 2018-12-10 ENCOUNTER — Ambulatory Visit: Attending: Nurse Practitioner | Primary: Nurse Practitioner

## 2018-12-10 ENCOUNTER — Ambulatory Visit
Admit: 2018-12-10 | Discharge: 2018-12-10 | Payer: PRIVATE HEALTH INSURANCE | Attending: Nurse Practitioner | Primary: Nurse Practitioner

## 2018-12-10 DIAGNOSIS — J0140 Acute pansinusitis, unspecified: Secondary | ICD-10-CM

## 2018-12-10 MED ORDER — AMOXICILLIN CLAVULANATE 875 MG-125 MG TAB
875-125 mg | ORAL_TABLET | Freq: Two times a day (BID) | ORAL | 0 refills | Status: AC
Start: 2018-12-10 — End: 2018-12-20

## 2018-12-10 NOTE — Progress Notes (Signed)
 Barbara [name] presents today for No chief complaint on file.      Is someone accompanying this pt? yes    Is the patient using any DME equipment during OV? no    Depression Screening:  3 most recent PHQ Screens 02/23/2018   Little interest or pleasure in doing things Several days   Feeling down, depressed, irritable, or hopeless More than half the days   Total Score PHQ 2 3   Trouble falling or staying asleep, or sleeping too much -   Feeling tired or having little energy -   Poor appetite, weight loss, or overeating -   Feeling bad about yourself - or that you are a failure or have let yourself or your family down -   Trouble concentrating on things such as school, work, reading, or watching TV -   Moving or speaking so slowly that other people could have noticed; or the opposite being so fidgety that others notice -   Thoughts of being better off dead, or hurting yourself in some way -   PHQ 9 Score -   How difficult have these problems made it for you to do your work, take care of your home and get along with others -       Learning Assessment:  Learning Assessment 02/23/2018   PRIMARY LEARNER Patient   HIGHEST LEVEL OF EDUCATION - PRIMARY LEARNER  2 YEARS OF COLLEGE   BARRIERS PRIMARY LEARNER NONE   CO-LEARNER CAREGIVER No   PRIMARY LANGUAGE ENGLISH   LEARNER PREFERENCE PRIMARY LISTENING   ANSWERED BY patient   RELATIONSHIP SELF       Abuse Screening:  Abuse Screening Questionnaire 10/23/2018   Do you ever feel afraid of your partner? N   Are you in a relationship with someone who physically or mentally threatens you? N   Is it safe for you to go home? Y       Fall Risk  Fall Risk Assessment, last 12 mths 02/23/2018   Able to walk? Yes   Fall in past 12 months? No       Health Maintenance reviewed and discussed and ordered per Provider.    Health Maintenance Due   Topic Date Due   . PAP AKA CERVICAL CYTOLOGY  11/27/1988   . Shingrix Vaccine Age 52> (1 of 2) 11/27/2017   . FOBT Q1Y Age 58-75  11/27/2017   .  Influenza Age 54 to Adult  05/14/2018   .      Coordination of Care:  1. Have you been to the ER, urgent care clinic since your last visit? Hospitalized since your last visit? yes    2. Have you seen or consulted any other health care providers outside of the [facility] since your last visit? Include any pap smears or colon screening. no      Last PMP Checked na  Last UDS Checked na  Last Pain contract signed: na

## 2018-12-10 NOTE — Progress Notes (Signed)
Subjective     Patient ID:  Barbara [name] is a 53 y.o. (DOB 31-Jan-1966) female who presents for the following:   No chief complaint on file.      HPI   Presents to follow-up.  Symptoms started 8 days ago.  Nasal congestion, rhinorrhea, postnasal drip, cough productive of white/yellow sputum, shortness of breath, ear pain, sinus pain/pressure.  She went to an ER 5 days ago, [facility] for these symptoms.  She was treated with azithromycin and Tessalon.  States that ear pain improved somewhat with the medication.  But Tessalon did not help with the cough.  Finished taking azithromycin yesterday.        Review of Systems   Constitutional: Positive for fatigue. Negative for appetite change, chills, diaphoresis and fever.   HENT: Positive for congestion, ear pain, rhinorrhea, sinus pressure, sinus pain and sneezing. Negative for ear discharge, hearing loss, postnasal drip, sore throat and trouble swallowing.    Eyes: Negative for discharge, redness and visual disturbance.   Respiratory: Positive for cough and shortness of breath. Negative for chest tightness and wheezing.    Cardiovascular: Negative for chest pain.   Gastrointestinal: Negative for abdominal pain, diarrhea, nausea and vomiting.   Genitourinary: Negative for decreased urine volume.   Musculoskeletal: Negative for myalgias, neck pain and neck stiffness.   Skin: Negative for rash.   Allergic/Immunologic: Negative for environmental allergies.   Neurological: Positive for headaches. Negative for dizziness and light-headedness.        Past Medical History, Past Surgery History, Allergies, Social History, and Family History were reviewed and updated.      Patient Active Problem List   Diagnosis Code   ??? Essential hypertension I10   ??? 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 Pain of left hip joint M25.552   ??? Low back pain M54.5   ??? Smoker F17.200   ??? Dupuytren contracture M72.0   ??? S/P mastectomy, left Z90.12   ???  Cigarette nicotine dependence without complication S28.315   ??? Recurrent depression (HCC) F33.9   ??? History of breast cancer Z85.3     Past Medical History:   Diagnosis Date   ??? Back pain    ??? Breast cancer ([place]) 2008 or 2009   ??? Chronic pain    ??? Diabetes ([place])    ??? FH: chemotherapy    ??? Hypertension    ??? Nausea & vomiting    ??? Neuropathy    ??? Radiation therapy complication 1761    NOT complication, just radiation     Patient Care Team:  Toniann Ket, NP as PCP - General (Nurse Practitioner)  [name], DO as PCP - [facility] Empaneled Provider  Anastasio Auerbach., RN as Nurse Navigator (Oncology)  Jennye Boroughs, Jackqulyn Livings, MD (Breast Surgery)  Lilian Coma, MD as Physician (Physical Medicine and Rehab)    Past Surgical History:   Procedure Laterality Date   ??? BIOPSY BREAST     ??? BIOPSY FINE NEEDLE     ??? HX BREAST AUGMENTATION Right 2010   ??? HX BREAST AUGMENTATION Left 09/17/2017    left breast reconstruction with exchange of tissue expander per breast prostesis and left breast tissue expander performed by [name], MD at [place]   ??? HX BREAST AUGMENTATION Right 04/01/2018    removal of tissue expanders with placment of right permanent breast prostesis performed by [name], MD at [place]   ???  HX BREAST RECONSTRUCTION Right 2011    per pt - attempted latissimus dorsi flap, then saline implant   ??? HX BREAST RECONSTRUCTION Bilateral 06/10/2017    right breast prosthesis and capsule removal and bilateral breast tissue  expander reconstruction.  performed by [name], MD at [place]   ??? HX BREAST RECONSTRUCTION Left 07/09/2017    left breast  EXCISION and closure of wound performed by [name], MD at [place]   ??? HX BREAST RECONSTRUCTION Bilateral     Tissue expanders, to prepare for implants    ??? HX CESAREAN SECTION     ??? HX LUMBAR DISKECTOMY      herniated disc   ??? HX MASTECTOMY Right 2010    Total mastectomy per pt.     ??? HX MASTECTOMY Left 06/10/2017     left mastectomy,Sentinal node biopsies. performed by [name], MD at [facility] MAIN OR   ??? HX MASTECTOMY Bilateral    ??? HX MASTOPEXY (BREAST LIFT) Left 2011   ??? IMPLANT BREAST SILICONE/EQ Right 0960    post mastectomy   ??? THUMB SUPPORT      thumb surgery to mobilize thumb     Family History   Problem Relation Age of Onset   ??? Diabetes Mother    ??? No Known Problems Father      Social History     Tobacco Use   ??? Smoking status: Current Every Day Smoker     Packs/day: 0.50     Years: 15.00     Pack years: 7.50   ??? Smokeless tobacco: Never Used   ??? Tobacco comment: 5-6 cigs per day patient education given   Substance Use Topics   ??? Alcohol use: No   ??? Drug use: No     Allergies   Allergen Reactions   ??? Tramadol Anaphylaxis   ??? Metformin Diarrhea   ??? Trulicity [Dulaglutide] Nausea Only     Current Outpatient Medications on File Prior to Visit   Medication Sig Dispense Refill   ??? albuterol (PROVENTIL HFA, VENTOLIN HFA, PROAIR HFA) 90 mcg/actuation inhaler Take 2 Puffs by inhalation every six (6) hours as needed for Wheezing. 1 Inhaler 3   ??? cholecalciferol (VITAMIN D3) (2,000 UNITS /50 MCG) cap capsule Take 2,000 Units by mouth daily. 90 Cap 3   ??? insulin NPH (NOVOLIN N, HUMULIN N) 100 unit/mL injection 40 Units in the AM, 35 Units in the PM 1 Vial 5   ??? ibuprofen (MOTRIN) 600 mg tablet Take 1 Tab by mouth every six (6) hours as needed for Pain. 20 Tab 0   ??? gabapentin (NEURONTIN) 400 mg capsule 800 mg PO TID 270 Cap 3   ??? LORazepam (ATIVAN) 0.5 mg tablet Take 1 Tab by mouth every four (4) hours as needed for Anxiety. Max Daily Amount: 3 mg. 30 Tab 0   ??? diclofenac EC (VOLTAREN) 75 mg EC tablet Take 1 Tab by mouth two (2) times a day. 30 Tab 3   ??? atorvastatin (LIPITOR) 10 mg tablet Take 1 Tab by mouth daily. 90 Tab 1   ??? venlafaxine-SR (EFFEXOR-XR) 75 mg capsule Take 2 Caps by mouth daily. 90 Cap 3   ??? ramipril (ALTACE) 10 mg capsule Take 1 Cap by mouth daily. 90 Cap 3   ??? nystatin (MYCOSTATIN) powder Apply to rash 4  times a day 60 g 3   ??? ketoconazole (NIZORAL) 2 % topical cream Apply  to affected area two (  2) times a day. 60 g 3   ??? letrozole (FEMARA) 2.5 mg tablet Take 2.5 mg by mouth daily.       No current facility-administered medications on file prior to visit.      Health Maintenance Due   Topic Date Due   ??? PAP AKA CERVICAL CYTOLOGY  11/27/1988   ??? Shingrix Vaccine Age 66> (1 of 2) 11/27/2017   ??? FOBT Q1Y Age 66-75  11/27/2017   ??? Influenza Age 66 to Adult  05/14/2018         Objective     Visit Vitals  BP (!) 165/102   Pulse 79   Temp 98.8 ??F (37.1 ??C) (Oral)   Resp 20   Ht 5\' 2"  (1.575 m)   Wt 127 lb 9.6 oz (57.9 kg)   SpO2 97%   BMI 23.34 kg/m??     No LMP recorded. Patient is perimenopausal.    Physical Exam  Constitutional:       General: She is not in acute distress.     Appearance: She is well-developed. She is not toxic-appearing or diaphoretic.   HENT:      Right Ear: Hearing and ear canal normal. Tympanic membrane is erythematous and bulging.      Left Ear: Hearing and ear canal normal. Tympanic membrane is perforated. Tympanic membrane is not erythematous.      Nose: Nasal tenderness and congestion present.      Right Sinus: Maxillary sinus tenderness present. No frontal sinus tenderness.      Left Sinus: Maxillary sinus tenderness present. No frontal sinus tenderness.      Mouth/Throat:      Pharynx: No oropharyngeal exudate, posterior oropharyngeal erythema or uvula swelling.      Tonsils: No tonsillar abscesses.   Eyes:      General:         Right eye: No discharge.         Left eye: No discharge.      Conjunctiva/sclera: Conjunctivae normal.   Neck:      Musculoskeletal: Neck supple.   Cardiovascular:      Rate and Rhythm: Normal rate and regular rhythm.      Heart sounds: Normal heart sounds. No murmur.   Pulmonary:      Effort: Pulmonary effort is normal. No respiratory distress.      Breath sounds: Normal breath sounds. No decreased breath sounds, wheezing, rhonchi or rales.   Lymphadenopathy:       Cervical: No cervical adenopathy.   Skin:     General: Skin is warm and dry.      Findings: No rash.   Neurological:      Mental Status: She is alert and oriented to person, place, and time.           LABS     TESTS      Assessment and Plan     1. Acute non-recurrent pansinusitis  Start Augmentin as directed.  May use OTC saline and steroid nasal sprays.  May use Robitussin (sugar-free) syrup as needed for cough.  - amoxicillin-clavulanate (AUGMENTIN) 875-125 mg per tablet; Take 1 Tab by mouth every twelve (12) hours for 10 days.  Dispense: 20 Tab; Refill: 0    2. Right otitis media, unspecified otitis media type  Follow-up with ENT as planned for left perforated eardrum.  - amoxicillin-clavulanate (AUGMENTIN) 875-125 mg per tablet; Take 1 Tab by mouth every twelve (12) hours for 10 days.  Dispense: 20 Tab;  Refill: 0    3. Hypertension, unspecified type  Blood pressure elevated today.  Monitor at home.  May increase ramipril to 20 mg daily until blood pressure is controlled.      Follow-up and Dispositions    ?? Return if symptoms worsen or fail to improve.         Risks, benefits, and alternatives of the medications and treatment plan prescribed today were discussed, and patient expressed understanding. Printed after visit summary was given to patient and reviewed. All patient questions and concerns were addressed. Plan follow-up as discussed or as needed if any worsening symptoms or change in condition.           Signed electronically by Lawrence Marseilles, DNP, FNP-BC

## 2018-12-10 NOTE — Progress Notes (Signed)
Barbara [name] presents today for No chief complaint on file.      Is someone accompanying this pt? yes    Is the patient using any DME equipment during [place]? no    Depression Screening:  3 most recent PHQ Screens 02/23/2018   Little interest or pleasure in doing things Several days   Feeling down, depressed, irritable, or hopeless More than half the days   Total Score PHQ 2 3   Trouble falling or staying asleep, or sleeping too much -   Feeling tired or having little energy -   Poor appetite, weight loss, or overeating -   Feeling bad about yourself - or that you are a failure or have let yourself or your family down -   Trouble concentrating on things such as school, work, reading, or watching TV -   Moving or speaking so slowly that other people could have noticed; or the opposite being so fidgety that others notice -   Thoughts of being better off dead, or hurting yourself in some way -   PHQ 9 Score -   How difficult have these problems made it for you to do your work, take care of your home and get along with others -       Learning Assessment:  Learning Assessment 02/23/2018   PRIMARY LEARNER Patient   HIGHEST LEVEL OF EDUCATION - PRIMARY LEARNER  2 YEARS OF COLLEGE   BARRIERS PRIMARY LEARNER NONE   CO-LEARNER CAREGIVER No   PRIMARY LANGUAGE ENGLISH   LEARNER PREFERENCE PRIMARY LISTENING   ANSWERED BY patient   RELATIONSHIP SELF       Abuse Screening:  Abuse Screening Questionnaire 10/23/2018   Do you ever feel afraid of your partner? N   Are you in a relationship with someone who physically or mentally threatens you? N   Is it safe for you to go home? Y       Fall Risk  Fall Risk Assessment, last 12 mths 02/23/2018   Able to walk? Yes   Fall in past 12 months? No       Health Maintenance reviewed and discussed and ordered per Provider.    Health Maintenance Due   Topic Date Due   ??? PAP AKA CERVICAL CYTOLOGY  11/27/1988   ??? Shingrix Vaccine Age 13> (1 of 2) 11/27/2017   ??? FOBT Q1Y Age 13-75  11/27/2017    ??? Influenza Age 33 to Adult  05/14/2018   .      Coordination of Care:  1. Have you been to the ER, urgent care clinic since your last visit? Hospitalized since your last visit? yes    2. Have you seen or consulted any other health care providers outside of the [place] since your last visit? Include any pap smears or colon screening. no      Last PMP Checked na  Last UDS Checked na  Last Pain contract signed: na

## 2018-12-10 NOTE — Progress Notes (Signed)
Subjective     Patient ID:  Barbara [name] is a 53 y.o. (DOB [birth year]) female who presents for the following:   No chief complaint on file.      HPI   Presents to follow-up.  Symptoms started 8 days ago.  Nasal congestion, rhinorrhea, postnasal drip, cough productive of white/yellow sputum, shortness of breath, ear pain, sinus pain/pressure.  She went to an ER 5 days ago, [facility] for these symptoms.  She was treated with azithromycin and Tessalon.  States that ear pain improved somewhat with the medication.  But Tessalon did not help with the cough.  Finished taking azithromycin yesterday.        Review of Systems   Constitutional: Positive for fatigue. Negative for appetite change, chills, diaphoresis and fever.   HENT: Positive for congestion, ear pain, rhinorrhea, sinus pressure, sinus pain and sneezing. Negative for ear discharge, hearing loss, postnasal drip, sore throat and trouble swallowing.    Eyes: Negative for discharge, redness and visual disturbance.   Respiratory: Positive for cough and shortness of breath. Negative for chest tightness and wheezing.    Cardiovascular: Negative for chest pain.   Gastrointestinal: Negative for abdominal pain, diarrhea, nausea and vomiting.   Genitourinary: Negative for decreased urine volume.   Musculoskeletal: Negative for myalgias, neck pain and neck stiffness.   Skin: Negative for rash.   Allergic/Immunologic: Negative for environmental allergies.   Neurological: Positive for headaches. Negative for dizziness and light-headedness.        Past Medical History, Past Surgery History, Allergies, Social History, and Family History were reviewed and updated.      Patient Active Problem List   Diagnosis Code   ??? Essential hypertension I10   ??? 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 Pain of left hip joint M25.552   ??? Low back pain M54.5   ??? Smoker F17.200   ??? Dupuytren contracture M72.0   ??? S/P mastectomy, left Z90.12    ??? Cigarette nicotine dependence without complication W23.762   ??? Recurrent depression (HCC) F33.9   ??? History of breast cancer Z85.3     Past Medical History:   Diagnosis Date   ??? Back pain    ??? Breast cancer ([place]) 2008 or 2009   ??? Chronic pain    ??? Diabetes ([place])    ??? FH: chemotherapy    ??? Hypertension    ??? Nausea & vomiting    ??? Neuropathy    ??? Radiation therapy complication 8315    NOT complication, just radiation     Patient Care Team:  Toniann Ket, NP as PCP - General (Nurse Practitioner)  [name], DO as PCP - [facility] Empaneled Provider  Anastasio Auerbach., RN as Nurse Navigator (Oncology)  Jennye Boroughs, Jackqulyn Livings, MD (Breast Surgery)  Lilian Coma, MD as Physician (Physical Medicine and Rehab)    Past Surgical History:   Procedure Laterality Date   ??? BIOPSY BREAST     ??? BIOPSY FINE NEEDLE     ??? HX BREAST AUGMENTATION Right 2010   ??? HX BREAST AUGMENTATION Left 09/17/2017    left breast reconstruction with exchange of tissue expander per breast prostesis and left breast tissue expander performed by [name], MD at [place]   ??? HX BREAST AUGMENTATION Right 04/01/2018    removal of tissue expanders with placment of right permanent breast prostesis performed by [name], MD at [place]   ???  HX BREAST RECONSTRUCTION Right 2011    per pt - attempted latissimus dorsi flap, then saline implant   ??? HX BREAST RECONSTRUCTION Bilateral 06/10/2017    right breast prosthesis and capsule removal and bilateral breast tissue  expander reconstruction.  performed by [name], MD at [place]   ??? HX BREAST RECONSTRUCTION Left 07/09/2017    left breast  EXCISION and closure of wound performed by [name], MD at [place]   ??? HX BREAST RECONSTRUCTION Bilateral     Tissue expanders, to prepare for implants    ??? HX CESAREAN SECTION     ??? HX LUMBAR DISKECTOMY      herniated disc   ??? HX MASTECTOMY Right 2010    Total mastectomy per pt.     ??? HX MASTECTOMY Left 06/10/2017     left mastectomy,Sentinal node biopsies. performed by [name], MD at [facility] MAIN OR   ??? HX MASTECTOMY Bilateral    ??? HX MASTOPEXY (BREAST LIFT) Left 2011   ??? IMPLANT BREAST SILICONE/EQ Right 5102    post mastectomy   ??? THUMB SUPPORT      thumb surgery to mobilize thumb     Family History   Problem Relation Age of Onset   ??? Diabetes Mother    ??? No Known Problems Father      Social History     Tobacco Use   ??? Smoking status: Current Every Day Smoker     Packs/day: 0.50     Years: 15.00     Pack years: 7.50   ??? Smokeless tobacco: Never Used   ??? Tobacco comment: 5-6 cigs per day patient education given   Substance Use Topics   ??? Alcohol use: No   ??? Drug use: No     Allergies   Allergen Reactions   ??? Tramadol Anaphylaxis   ??? Metformin Diarrhea   ??? Trulicity [Dulaglutide] Nausea Only     Current Outpatient Medications on File Prior to Visit   Medication Sig Dispense Refill   ??? albuterol (PROVENTIL HFA, VENTOLIN HFA, PROAIR HFA) 90 mcg/actuation inhaler Take 2 Puffs by inhalation every six (6) hours as needed for Wheezing. 1 Inhaler 3   ??? cholecalciferol (VITAMIN D3) (2,000 UNITS /50 MCG) cap capsule Take 2,000 Units by mouth daily. 90 Cap 3   ??? insulin NPH (NOVOLIN N, HUMULIN N) 100 unit/mL injection 40 Units in the AM, 35 Units in the PM 1 Vial 5   ??? ibuprofen (MOTRIN) 600 mg tablet Take 1 Tab by mouth every six (6) hours as needed for Pain. 20 Tab 0   ??? gabapentin (NEURONTIN) 400 mg capsule 800 mg PO TID 270 Cap 3   ??? LORazepam (ATIVAN) 0.5 mg tablet Take 1 Tab by mouth every four (4) hours as needed for Anxiety. Max Daily Amount: 3 mg. 30 Tab 0   ??? diclofenac EC (VOLTAREN) 75 mg EC tablet Take 1 Tab by mouth two (2) times a day. 30 Tab 3   ??? atorvastatin (LIPITOR) 10 mg tablet Take 1 Tab by mouth daily. 90 Tab 1   ??? venlafaxine-SR (EFFEXOR-XR) 75 mg capsule Take 2 Caps by mouth daily. 90 Cap 3   ??? ramipril (ALTACE) 10 mg capsule Take 1 Cap by mouth daily. 90 Cap 3    ??? nystatin (MYCOSTATIN) powder Apply to rash 4 times a day 60 g 3   ??? ketoconazole (NIZORAL) 2 % topical cream Apply  to affected area two (  2) times a day. 60 g 3   ??? letrozole (FEMARA) 2.5 mg tablet Take 2.5 mg by mouth daily.       No current facility-administered medications on file prior to visit.      Health Maintenance Due   Topic Date Due   ??? PAP AKA CERVICAL CYTOLOGY  11/27/1988   ??? Shingrix Vaccine Age 32> (1 of 2) 11/27/2017   ??? FOBT Q1Y Age 32-75  11/27/2017   ??? Influenza Age 40 to Adult  05/14/2018         Objective     Visit Vitals  BP (!) 165/102   Pulse 79   Temp 98.8 ??F (37.1 ??C) (Oral)   Resp 20   Ht 5\' 2"  (1.575 m)   Wt 127 lb 9.6 oz (57.9 kg)   SpO2 97%   BMI 23.34 kg/m??     No LMP recorded. Patient is perimenopausal.    Physical Exam  Constitutional:       General: She is not in acute distress.     Appearance: She is well-developed. She is not toxic-appearing or diaphoretic.   HENT:      Right Ear: Hearing and ear canal normal. Tympanic membrane is erythematous and bulging.      Left Ear: Hearing and ear canal normal. Tympanic membrane is perforated. Tympanic membrane is not erythematous.      Nose: Nasal tenderness and congestion present.      Right Sinus: Maxillary sinus tenderness present. No frontal sinus tenderness.      Left Sinus: Maxillary sinus tenderness present. No frontal sinus tenderness.      Mouth/Throat:      Pharynx: No oropharyngeal exudate, posterior oropharyngeal erythema or uvula swelling.      Tonsils: No tonsillar abscesses.   Eyes:      General:         Right eye: No discharge.         Left eye: No discharge.      Conjunctiva/sclera: Conjunctivae normal.   Neck:      Musculoskeletal: Neck supple.   Cardiovascular:      Rate and Rhythm: Normal rate and regular rhythm.      Heart sounds: Normal heart sounds. No murmur.   Pulmonary:      Effort: Pulmonary effort is normal. No respiratory distress.      Breath sounds: Normal breath sounds. No decreased breath sounds,  wheezing, rhonchi or rales.   Lymphadenopathy:      Cervical: No cervical adenopathy.   Skin:     General: Skin is warm and dry.      Findings: No rash.   Neurological:      Mental Status: She is alert and oriented to person, place, and time.           LABS     TESTS      Assessment and Plan     1. Acute non-recurrent pansinusitis  Start Augmentin as directed.  May use OTC saline and steroid nasal sprays.  May use Robitussin (sugar-free) syrup as needed for cough.  - amoxicillin-clavulanate (AUGMENTIN) 875-125 mg per tablet; Take 1 Tab by mouth every twelve (12) hours for 10 days.  Dispense: 20 Tab; Refill: 0    2. Right otitis media, unspecified otitis media type  Follow-up with ENT as planned for left perforated eardrum.  - amoxicillin-clavulanate (AUGMENTIN) 875-125 mg per tablet; Take 1 Tab by mouth every twelve (12) hours for 10 days.  Dispense: 20 Tab;  Refill: 0    3. Hypertension, unspecified type  Blood pressure elevated today.  Monitor at home.  May increase ramipril to 20 mg daily until blood pressure is controlled.      Follow-up and Dispositions    ?? Return if symptoms worsen or fail to improve.         Risks, benefits, and alternatives of the medications and treatment plan prescribed today were discussed, and patient expressed understanding. Printed after visit summary was given to patient and reviewed. All patient questions and concerns were addressed. Plan follow-up as discussed or as needed if any worsening symptoms or change in condition.           Signed electronically by Lawrence Marseilles, DNP, FNP-BC

## 2018-12-10 NOTE — Patient Instructions (Addendum)
For symptom relief:    Intranasal corticosteroids (fluticasone, Flonase, Nasonex, etc.), nasal sprays to reduce inflammation in the nasal passages    Saline nasal irrigation (Netipot, saline nasal sprays, etc.) If irrigating, be sure to use distilled, boiled, or filtered water.     Robitussin (sugar free) for cough

## 2018-12-14 ENCOUNTER — Encounter

## 2018-12-14 NOTE — Telephone Encounter (Signed)
Please ask her if she has taken anything with codeine before. She has an allergy listed to tramadol which is in the same class.

## 2018-12-14 NOTE — Telephone Encounter (Signed)
Pt. Is requesting cough syrup because her cough is getting worse. Please assist.

## 2018-12-15 NOTE — Telephone Encounter (Signed)
Called patient ask to return call . About cough medication.

## 2018-12-15 NOTE — Telephone Encounter (Signed)
Pt called back in returning Nurse Donna's phone call. Please advise.

## 2018-12-15 NOTE — Telephone Encounter (Signed)
Returned patients call was unable to reach her by phone left message.

## 2018-12-24 ENCOUNTER — Ambulatory Visit: Attending: Nurse Practitioner | Primary: Nurse Practitioner

## 2018-12-24 ENCOUNTER — Ambulatory Visit
Admit: 2018-12-24 | Discharge: 2018-12-24 | Payer: PRIVATE HEALTH INSURANCE | Attending: Nurse Practitioner | Primary: Nurse Practitioner

## 2018-12-24 ENCOUNTER — Encounter

## 2018-12-24 DIAGNOSIS — F418 Other specified anxiety disorders: Secondary | ICD-10-CM

## 2018-12-24 MED ORDER — VENLAFAXINE SR 75 MG 24 HR CAP
75 mg | ORAL_CAPSULE | Freq: Every day | ORAL | 3 refills | Status: DC
Start: 2018-12-24 — End: 2019-05-25

## 2018-12-24 MED ORDER — DICLOFENAC 75 MG TAB, DELAYED RELEASE
75 mg | ORAL_TABLET | Freq: Two times a day (BID) | ORAL | 3 refills | Status: AC
Start: 2018-12-24 — End: ?

## 2018-12-24 MED ORDER — ALPRAZOLAM 0.5 MG TAB
0.5 mg | ORAL_TABLET | Freq: Every day | ORAL | 0 refills | Status: DC | PRN
Start: 2018-12-24 — End: 2019-02-01

## 2018-12-24 NOTE — Progress Notes (Signed)
Barbara [name] presents today for   Chief Complaint   Patient presents with   . Follow-up       Is someone accompanying this pt? no    Is the patient using any DME equipment during OV? no    Depression Screening:  3 most recent PHQ Screens 02/23/2018   Little interest or pleasure in doing things Several days   Feeling down, depressed, irritable, or hopeless More than half the days   Total Score PHQ 2 3   Trouble falling or staying asleep, or sleeping too much -   Feeling tired or having little energy -   Poor appetite, weight loss, or overeating -   Feeling bad about yourself - or that you are a failure or have let yourself or your family down -   Trouble concentrating on things such as school, work, reading, or watching TV -   Moving or speaking so slowly that other people could have noticed; or the opposite being so fidgety that others notice -   Thoughts of being better off dead, or hurting yourself in some way -   PHQ 9 Score -   How difficult have these problems made it for you to do your work, take care of your home and get along with others -       Learning Assessment:  Learning Assessment 02/23/2018   PRIMARY LEARNER Patient   HIGHEST LEVEL OF EDUCATION - PRIMARY LEARNER  2 YEARS OF COLLEGE   BARRIERS PRIMARY LEARNER NONE   CO-LEARNER CAREGIVER No   PRIMARY LANGUAGE ENGLISH   LEARNER PREFERENCE PRIMARY LISTENING   ANSWERED BY patient   RELATIONSHIP SELF       Abuse Screening:  Abuse Screening Questionnaire 10/23/2018   Do you ever feel afraid of your partner? N   Are you in a relationship with someone who physically or mentally threatens you? N   Is it safe for you to go home? Y       Fall Risk  Fall Risk Assessment, last 12 mths 02/23/2018   Able to walk? Yes   Fall in past 12 months? No       Health Maintenance reviewed and discussed and ordered per Provider.    Health Maintenance Due   Topic Date Due   . PAP AKA CERVICAL CYTOLOGY  11/27/1988   . Shingrix Vaccine Age 33> (1 of 2) 11/27/2017   . FOBT Q1Y Age  25-75  11/27/2017   . Influenza Age 32 to Adult  05/14/2018   .      Coordination of Care:  1. Have you been to the ER, urgent care clinic since your last visit? Hospitalized since your last visit? no    2. Have you seen or consulted any other health care providers outside of the [facility] since your last visit? Include any pap smears or colon screening. no      Last PMP Checked na  Last UDS Checked na  Last Pain contract signed: na

## 2018-12-24 NOTE — Progress Notes (Signed)
Subjective     Patient ID:  Barbara [name] is a 53 y.o. (DOB 06/28/1966) female who presents for the following:   Follow-up      HPI   She presents to follow-up.    Oncology:  She has a history of breast cancer.  She reports that her oncologist, Dr. Brent General told her that her cancer has returned.    She will be getting further testing and imaging to evaluate the extent.    Tobacco dependence:  Reports that she has cut back on cigarettes.  She is trying to quit.    Sinusitis/otitis media follow-up:  Completed the antibiotic.  Reports symptoms have mostly resolved.  Still having occasional cough.    Depression with anxiety:   Taking ativan occasionally as needed.  Reports anxiety is high related to the recent diagnosis of recurrent cancer.    Health maintenance:  Has not had colonoscopy and pap smear.     Vitamin D deficiency:   Taking vit D supplement    Diabetes, type 2 follow up:  Taking medications as prescribed: Yes  Symptoms of hyperglycemia: none  Symptoms of hypoglycemia: none      Review of Systems   Constitutional: Negative for appetite change, chills, diaphoresis, fatigue, fever and unexpected weight change.   HENT: Negative for congestion, ear discharge, ear pain, hearing loss, postnasal drip, rhinorrhea, sinus pressure, sinus pain, sneezing, sore throat and trouble swallowing.    Eyes: Negative for discharge, redness and visual disturbance.   Respiratory: Positive for cough. Negative for chest tightness, shortness of breath and wheezing.    Cardiovascular: Negative for chest pain, palpitations and leg swelling.   Gastrointestinal: Negative for abdominal distention, abdominal pain, blood in stool, constipation, diarrhea, nausea, rectal pain and vomiting.   Endocrine: Negative for polydipsia, polyphagia and polyuria.   Genitourinary: Negative for decreased urine volume, dysuria and frequency.   Musculoskeletal: Negative for joint swelling, myalgias, neck pain and neck stiffness.   Skin: Negative for rash  and wound.   Allergic/Immunologic: Negative for environmental allergies.   Neurological: Negative for dizziness, weakness, light-headedness, numbness and headaches.   Psychiatric/Behavioral: Negative for dysphoric mood and sleep disturbance. The patient is not nervous/anxious.         Past Medical History, Past Surgery History, Allergies, Social History, and Family History were reviewed and updated.      Patient Active Problem List   Diagnosis Code   ??? Essential hypertension I10   ??? 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 Pain of left hip joint M25.552   ??? Low back pain M54.5   ??? Smoker F17.200   ??? Dupuytren contracture M72.0   ??? S/P mastectomy, left Z90.12   ??? Cigarette nicotine dependence without complication Q59.563   ??? Recurrent depression ([facility]) F33.9   ??? History of breast cancer Z85.3     Past Medical History:   Diagnosis Date   ??? Back pain    ??? Breast cancer ([place]) 2008 or 2009   ??? Chronic pain    ??? Diabetes ([place])    ??? FH: chemotherapy    ??? Hypertension    ??? Nausea & vomiting    ??? Neuropathy    ??? Radiation therapy complication 8756    NOT complication, just radiation     Patient Care Team:  Toniann Ket, NP as PCP - General (Nurse Practitioner)  Isaiah Blakes Kerrin Champagne, NP as PCP - [facility] Empaneled Provider  Anastasio Auerbach., RN as  Nurse Navigator (Oncology)  Jennye Boroughs, Jackqulyn Livings, MD (Breast Surgery)  Lilian Coma, MD as Physician (Physical Medicine and Rehab)    Past Surgical History:   Procedure Laterality Date   ??? BIOPSY BREAST     ??? BIOPSY FINE NEEDLE     ??? HX BREAST AUGMENTATION Right 2010   ??? HX BREAST AUGMENTATION Left 09/17/2017    left breast reconstruction with exchange of tissue expander per breast prostesis and left breast tissue expander performed by [name], MD at [place]   ??? HX BREAST AUGMENTATION Right 04/01/2018    removal of tissue expanders with placment of right permanent breast prostesis performed by [name], MD at [place]   ??? HX  BREAST RECONSTRUCTION Right 2011    per pt - attempted latissimus dorsi flap, then saline implant   ??? HX BREAST RECONSTRUCTION Bilateral 06/10/2017    right breast prosthesis and capsule removal and bilateral breast tissue  expander reconstruction.  performed by [name], MD at [place]   ??? HX BREAST RECONSTRUCTION Left 07/09/2017    left breast  EXCISION and closure of wound performed by [name], MD at [place]   ??? HX BREAST RECONSTRUCTION Bilateral     Tissue expanders, to prepare for implants    ??? HX CESAREAN SECTION     ??? HX LUMBAR DISKECTOMY      herniated disc   ??? HX MASTECTOMY Right 2010    Total mastectomy per pt.     ??? HX MASTECTOMY Left 06/10/2017    left mastectomy,Sentinal node biopsies. performed by [name], [facility] at [facility] MAIN OR   ??? HX MASTECTOMY Bilateral    ??? HX MASTOPEXY (BREAST LIFT) Left 2011   ??? IMPLANT BREAST SILICONE/EQ Right 1610    post mastectomy   ??? THUMB SUPPORT      thumb surgery to mobilize thumb     Family History   Problem Relation Age of Onset   ??? Diabetes Mother    ??? No Known Problems Father      Social History     Tobacco Use   ??? Smoking status: Current Every Day Smoker     Packs/day: 0.50     Years: 15.00     Pack years: 7.50   ??? Smokeless tobacco: Never Used   ??? Tobacco comment: 5-6 cigs per day patient education given   Substance Use Topics   ??? Alcohol use: No   ??? Drug use: No     Allergies   Allergen Reactions   ??? Tramadol Anaphylaxis   ??? Metformin Diarrhea   ??? Trulicity [Dulaglutide] Nausea Only     Current Outpatient Medications on File Prior to Visit   Medication Sig Dispense Refill   ??? albuterol (PROVENTIL HFA, VENTOLIN HFA, PROAIR HFA) 90 mcg/actuation inhaler Take 2 Puffs by inhalation every six (6) hours as needed for Wheezing. 1 Inhaler 3   ??? cholecalciferol (VITAMIN D3) (2,000 UNITS /50 MCG) cap capsule Take 2,000 Units by mouth daily. 90 Cap 3   ??? insulin NPH (NOVOLIN N, HUMULIN N) 100 unit/mL injection 40 Units in the AM, 35 Units in the PM  (Patient taking differently: 45 Units in the AM, 40 Units in the PM) 1 Vial 5   ??? ibuprofen (MOTRIN) 600 mg tablet Take 1 Tab by mouth every six (6) hours as needed for Pain. 20 Tab 0   ??? gabapentin (NEURONTIN) 400 mg capsule 800 mg PO  TID 270 Cap 3   ??? atorvastatin (LIPITOR) 10 mg tablet Take 1 Tab by mouth daily. 90 Tab 1   ??? ramipril (ALTACE) 10 mg capsule Take 1 Cap by mouth daily. 90 Cap 3   ??? nystatin (MYCOSTATIN) powder Apply to rash 4 times a day 60 g 3   ??? ketoconazole (NIZORAL) 2 % topical cream Apply  to affected area two (2) times a day. 60 g 3   ??? letrozole (FEMARA) 2.5 mg tablet Take 2.5 mg by mouth daily.       No current facility-administered medications on file prior to visit.      Health Maintenance Due   Topic Date Due   ??? Colonoscopy  11/27/1985   ??? PAP AKA CERVICAL CYTOLOGY  11/27/1988   ??? Shingrix Vaccine Age 76> (1 of 2) 11/27/2017   ??? Influenza Age 61 to Adult  05/14/2018         Objective     Visit Vitals  BP 132/83   Pulse 91   Temp 98 ??F (36.7 ??C) (Oral)   Resp 20   Ht 5\' 2"  (1.575 m)   Wt 128 lb 6.4 oz (58.2 kg)   SpO2 98%   BMI 23.48 kg/m??     No LMP recorded. Patient is perimenopausal.    Physical Exam  Constitutional:       General: She is not in acute distress.     Appearance: Normal appearance. She is well-developed. She is not toxic-appearing or diaphoretic.   HENT:      Right Ear: Hearing, tympanic membrane and ear canal normal.      Left Ear: Hearing, tympanic membrane and ear canal normal.      Nose: No mucosal edema or rhinorrhea.      Mouth/Throat:      Pharynx: No oropharyngeal exudate, posterior oropharyngeal erythema or uvula swelling.      Tonsils: No tonsillar abscesses.   Eyes:      General:         Right eye: No discharge.         Left eye: No discharge.      Conjunctiva/sclera: Conjunctivae normal.      Pupils: Pupils are equal, round, and reactive to light.   Neck:      Musculoskeletal: Neck supple.      Vascular: No carotid bruit.   Cardiovascular:      Rate and  Rhythm: Normal rate and regular rhythm.      Pulses: Normal pulses.      Heart sounds: Normal heart sounds. No murmur.   Pulmonary:      Effort: Pulmonary effort is normal. No respiratory distress.      Breath sounds: Normal breath sounds. No decreased breath sounds, wheezing, rhonchi or rales.   Abdominal:      General: Bowel sounds are normal. There is no distension.      Palpations: Abdomen is soft. There is no mass.      Tenderness: There is no abdominal tenderness.   Lymphadenopathy:      Cervical: No cervical adenopathy.   Skin:     General: Skin is warm and dry.      Findings: No rash.   Neurological:      Mental Status: She is alert and oriented to person, place, and time.   Psychiatric:         Mood and Affect: Mood is anxious.           LABS  TESTS      Assessment and Plan     1. Depression with anxiety  Situational anxiety is secondary to cancer diagnosis.  Ativan has been partially effective.  Xanax was previously more effective.  Stop Ativan.  Start Xanax as needed for situational anxiety.  - ALPRAZolam (Xanax) 0.5 mg tablet; Take 1 Tab by mouth daily as needed for Anxiety.  Dispense: 30 Tab; Refill: 0    2. Screening for colon cancer  - REFERRAL TO GASTROENTEROLOGY    3. Vitamin D deficiency  Recheck.  Labs previously ordered.    4. Type 2 diabetes mellitus without complication, with long-term current use of insulin ([place])  Continue current medication.  Further plan after lab results.  - CBC WITH AUTOMATED DIFF; Future  - METABOLIC PANEL, COMPREHENSIVE; Future  - HEMOGLOBIN A1C W/O EAG; Future  - URINALYSIS W/ RFLX MICROSCOPIC; Future    5. Trochanteric bursitis of left hip  Continue diclofenac as needed.  - diclofenac EC (VOLTAREN) 75 mg EC tablet; Take 1 Tab by mouth two (2) times a day.  Dispense: 30 Tab; Refill: 3    6. Lumbar disc disease  - diclofenac EC (VOLTAREN) 75 mg EC tablet; Take 1 Tab by mouth two (2) times a day.  Dispense: 30 Tab; Refill: 3    7. Hot flashes due to  menopause  Well-controlled.  Continue Effexor.  - venlafaxine-SR (EFFEXOR-XR) 75 mg capsule; Take 3 Caps by mouth daily.  Dispense: 90 Cap; Refill: 3      Follow-up and Dispositions    ?? Return for Follow up with Lelan Pons about 1 week after labs.         Risks, benefits, and alternatives of the medications and treatment plan prescribed today were discussed, and patient expressed understanding. Printed after visit summary was given to patient and reviewed. All patient questions and concerns were addressed. Plan follow-up as discussed or as needed if any worsening symptoms or change in condition.           Signed electronically by Lawrence Marseilles, DNP, FNP-BC

## 2018-12-24 NOTE — Telephone Encounter (Signed)
Pt called in and stated that the pharmacy needed Barbara [name] or the nurse to call them in regards to [facility] number before they can fill the prescriptions for th ept. Please advise.

## 2018-12-24 NOTE — Progress Notes (Signed)
Subjective     Patient ID:  Barbara [name] is a 53 y.o. (DOB Mar 10, 1966) female who presents for the following:   Follow-up      HPI   She presents to follow-up.    Oncology:  She has a history of breast cancer.  She reports that her oncologist, Dr. Brent General told her that her cancer has returned.    She will be getting further testing and imaging to evaluate the extent.    Tobacco dependence:  Reports that she has cut back on cigarettes.  She is trying to quit.    Sinusitis/otitis media follow-up:  Completed the antibiotic.  Reports symptoms have mostly resolved.  Still having occasional cough.    Depression with anxiety:   Taking ativan occasionally as needed.  Reports anxiety is high related to the recent diagnosis of recurrent cancer.    Health maintenance:  Has not had colonoscopy and pap smear.     Vitamin D deficiency:   Taking vit D supplement    Diabetes, type 2 follow up:  Taking medications as prescribed: Yes  Symptoms of hyperglycemia: none  Symptoms of hypoglycemia: none      Review of Systems   Constitutional: Negative for appetite change, chills, diaphoresis, fatigue, fever and unexpected weight change.   HENT: Negative for congestion, ear discharge, ear pain, hearing loss, postnasal drip, rhinorrhea, sinus pressure, sinus pain, sneezing, sore throat and trouble swallowing.    Eyes: Negative for discharge, redness and visual disturbance.   Respiratory: Positive for cough. Negative for chest tightness, shortness of breath and wheezing.    Cardiovascular: Negative for chest pain, palpitations and leg swelling.   Gastrointestinal: Negative for abdominal distention, abdominal pain, blood in stool, constipation, diarrhea, nausea, rectal pain and vomiting.   Endocrine: Negative for polydipsia, polyphagia and polyuria.   Genitourinary: Negative for decreased urine volume, dysuria and frequency.   Musculoskeletal: Negative for joint swelling, myalgias, neck pain and neck stiffness.    Skin: Negative for rash and wound.   Allergic/Immunologic: Negative for environmental allergies.   Neurological: Negative for dizziness, weakness, light-headedness, numbness and headaches.   Psychiatric/Behavioral: Negative for dysphoric mood and sleep disturbance. The patient is not nervous/anxious.         Past Medical History, Past Surgery History, Allergies, Social History, and Family History were reviewed and updated.      Patient Active Problem List   Diagnosis Code   ??? Essential hypertension I10   ??? 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 Pain of left hip joint M25.552   ??? Low back pain M54.5   ??? Smoker F17.200   ??? Dupuytren contracture M72.0   ??? S/P mastectomy, left Z90.12   ??? Cigarette nicotine dependence without complication Z60.109   ??? Recurrent depression (HCC) F33.9   ??? History of breast cancer Z85.3     Past Medical History:   Diagnosis Date   ??? Back pain    ??? Breast cancer ([place]) 2008 or 2009   ??? Chronic pain    ??? Diabetes ([place])    ??? FH: chemotherapy    ??? Hypertension    ??? Nausea & vomiting    ??? Neuropathy    ??? Radiation therapy complication 3235    NOT complication, just radiation     Patient Care Team:  Toniann Ket, NP as PCP - General (Nurse Practitioner)  Isaiah Blakes Kerrin Champagne, NP as PCP - [facility] Empaneled Provider  Anastasio Auerbach., RN as  Nurse Navigator (Oncology)  Jennye Boroughs, Jackqulyn Livings, MD (Breast Surgery)  Lilian Coma, MD as Physician (Physical Medicine and Rehab)    Past Surgical History:   Procedure Laterality Date   ??? BIOPSY BREAST     ??? BIOPSY FINE NEEDLE     ??? HX BREAST AUGMENTATION Right 2010   ??? HX BREAST AUGMENTATION Left 09/17/2017    left breast reconstruction with exchange of tissue expander per breast prostesis and left breast tissue expander performed by [name], MD at [place]   ??? HX BREAST AUGMENTATION Right 04/01/2018    removal of tissue expanders with placment of right permanent breast  prostesis performed by [name], MD at [place]   ??? HX BREAST RECONSTRUCTION Right 2011    per pt - attempted latissimus dorsi flap, then saline implant   ??? HX BREAST RECONSTRUCTION Bilateral 06/10/2017    right breast prosthesis and capsule removal and bilateral breast tissue  expander reconstruction.  performed by [name], MD at [place]   ??? HX BREAST RECONSTRUCTION Left 07/09/2017    left breast  EXCISION and closure of wound performed by [name], MD at [place]   ??? HX BREAST RECONSTRUCTION Bilateral     Tissue expanders, to prepare for implants    ??? HX CESAREAN SECTION     ??? HX LUMBAR DISKECTOMY      herniated disc   ??? HX MASTECTOMY Right 2010    Total mastectomy per pt.     ??? HX MASTECTOMY Left 06/10/2017    left mastectomy,Sentinal node biopsies. performed by [name], MD at [facility] MAIN OR   ??? HX MASTECTOMY Bilateral    ??? HX MASTOPEXY (BREAST LIFT) Left 2011   ??? IMPLANT BREAST SILICONE/EQ Right 9937    post mastectomy   ??? THUMB SUPPORT      thumb surgery to mobilize thumb     Family History   Problem Relation Age of Onset   ??? Diabetes Mother    ??? No Known Problems Father      Social History     Tobacco Use   ??? Smoking status: Current Every Day Smoker     Packs/day: 0.50     Years: 15.00     Pack years: 7.50   ??? Smokeless tobacco: Never Used   ??? Tobacco comment: 5-6 cigs per day patient education given   Substance Use Topics   ??? Alcohol use: No   ??? Drug use: No     Allergies   Allergen Reactions   ??? Tramadol Anaphylaxis   ??? Metformin Diarrhea   ??? Trulicity [Dulaglutide] Nausea Only     Current Outpatient Medications on File Prior to Visit   Medication Sig Dispense Refill   ??? albuterol (PROVENTIL HFA, VENTOLIN HFA, PROAIR HFA) 90 mcg/actuation inhaler Take 2 Puffs by inhalation every six (6) hours as needed for Wheezing. 1 Inhaler 3   ??? cholecalciferol (VITAMIN D3) (2,000 UNITS /50 MCG) cap capsule Take 2,000 Units by mouth daily. 90 Cap 3    ??? insulin NPH (NOVOLIN N, HUMULIN N) 100 unit/mL injection 40 Units in the AM, 35 Units in the PM (Patient taking differently: 45 Units in the AM, 40 Units in the PM) 1 Vial 5   ??? ibuprofen (MOTRIN) 600 mg tablet Take 1 Tab by mouth every six (6) hours as needed for Pain. 20 Tab 0   ??? gabapentin (NEURONTIN) 400 mg capsule 800 mg PO  TID 270 Cap 3   ??? atorvastatin (LIPITOR) 10 mg tablet Take 1 Tab by mouth daily. 90 Tab 1   ??? ramipril (ALTACE) 10 mg capsule Take 1 Cap by mouth daily. 90 Cap 3   ??? nystatin (MYCOSTATIN) powder Apply to rash 4 times a day 60 g 3   ??? ketoconazole (NIZORAL) 2 % topical cream Apply  to affected area two (2) times a day. 60 g 3   ??? letrozole (FEMARA) 2.5 mg tablet Take 2.5 mg by mouth daily.       No current facility-administered medications on file prior to visit.      Health Maintenance Due   Topic Date Due   ??? Colonoscopy  11/27/1985   ??? PAP AKA CERVICAL CYTOLOGY  11/27/1988   ??? Shingrix Vaccine Age 79> (1 of 2) 11/27/2017   ??? Influenza Age 37 to Adult  05/14/2018         Objective     Visit Vitals  BP 132/83   Pulse 91   Temp 98 ??F (36.7 ??C) (Oral)   Resp 20   Ht 5\' 2"  (1.575 m)   Wt 128 lb 6.4 oz (58.2 kg)   SpO2 98%   BMI 23.48 kg/m??     No LMP recorded. Patient is perimenopausal.    Physical Exam  Constitutional:       General: She is not in acute distress.     Appearance: Normal appearance. She is well-developed. She is not toxic-appearing or diaphoretic.   HENT:      Right Ear: Hearing, tympanic membrane and ear canal normal.      Left Ear: Hearing, tympanic membrane and ear canal normal.      Nose: No mucosal edema or rhinorrhea.      Mouth/Throat:      Pharynx: No oropharyngeal exudate, posterior oropharyngeal erythema or uvula swelling.      Tonsils: No tonsillar abscesses.   Eyes:      General:         Right eye: No discharge.         Left eye: No discharge.      Conjunctiva/sclera: Conjunctivae normal.      Pupils: Pupils are equal, round, and reactive to light.   Neck:       Musculoskeletal: Neck supple.      Vascular: No carotid bruit.   Cardiovascular:      Rate and Rhythm: Normal rate and regular rhythm.      Pulses: Normal pulses.      Heart sounds: Normal heart sounds. No murmur.   Pulmonary:      Effort: Pulmonary effort is normal. No respiratory distress.      Breath sounds: Normal breath sounds. No decreased breath sounds, wheezing, rhonchi or rales.   Abdominal:      General: Bowel sounds are normal. There is no distension.      Palpations: Abdomen is soft. There is no mass.      Tenderness: There is no abdominal tenderness.   Lymphadenopathy:      Cervical: No cervical adenopathy.   Skin:     General: Skin is warm and dry.      Findings: No rash.   Neurological:      Mental Status: She is alert and oriented to person, place, and time.   Psychiatric:         Mood and Affect: Mood is anxious.           LABS  TESTS      Assessment and Plan     1. Depression with anxiety  Situational anxiety is secondary to cancer diagnosis.  Ativan has been partially effective.  Xanax was previously more effective.  Stop Ativan.  Start Xanax as needed for situational anxiety.  - ALPRAZolam (Xanax) 0.5 mg tablet; Take 1 Tab by mouth daily as needed for Anxiety.  Dispense: 30 Tab; Refill: 0    2. Screening for colon cancer  - REFERRAL TO GASTROENTEROLOGY    3. Vitamin D deficiency  Recheck.  Labs previously ordered.    4. Type 2 diabetes mellitus without complication, with long-term current use of insulin ([place])  Continue current medication.  Further plan after lab results.  - CBC WITH AUTOMATED DIFF; Future  - METABOLIC PANEL, COMPREHENSIVE; Future  - HEMOGLOBIN A1C W/O EAG; Future  - URINALYSIS W/ RFLX MICROSCOPIC; Future    5. Trochanteric bursitis of left hip  Continue diclofenac as needed.  - diclofenac EC (VOLTAREN) 75 mg EC tablet; Take 1 Tab by mouth two (2) times a day.  Dispense: 30 Tab; Refill: 3    6. Lumbar disc disease   - diclofenac EC (VOLTAREN) 75 mg EC tablet; Take 1 Tab by mouth two (2) times a day.  Dispense: 30 Tab; Refill: 3    7. Hot flashes due to menopause  Well-controlled.  Continue Effexor.  - venlafaxine-SR (EFFEXOR-XR) 75 mg capsule; Take 3 Caps by mouth daily.  Dispense: 90 Cap; Refill: 3      Follow-up and Dispositions    ?? Return for Follow up with Lelan Pons about 1 week after labs.         Risks, benefits, and alternatives of the medications and treatment plan prescribed today were discussed, and patient expressed understanding. Printed after visit summary was given to patient and reviewed. All patient questions and concerns were addressed. Plan follow-up as discussed or as needed if any worsening symptoms or change in condition.           Signed electronically by Lawrence Marseilles, DNP, FNP-BC

## 2018-12-24 NOTE — Patient Instructions (Addendum)
[facility] [phone] fax # [phone]    [place] [phone]     Please come in fasting for your lab work in about 1 month. Do not eat or drink anything except water or black coffee for 8 hours. Our lab is open 8:30 a.m. to 4 p.m. Monday through Friday. You do not need an appointment.

## 2018-12-24 NOTE — Progress Notes (Signed)
Barbara [name] presents today for   Chief Complaint   Patient presents with   ??? Follow-up       Is someone accompanying this pt? no    Is the patient using any DME equipment during [place]? no    Depression Screening:  3 most recent PHQ Screens 02/23/2018   Little interest or pleasure in doing things Several days   Feeling down, depressed, irritable, or hopeless More than half the days   Total Score PHQ 2 3   Trouble falling or staying asleep, or sleeping too much -   Feeling tired or having little energy -   Poor appetite, weight loss, or overeating -   Feeling bad about yourself - or that you are a failure or have let yourself or your family down -   Trouble concentrating on things such as school, work, reading, or watching TV -   Moving or speaking so slowly that other people could have noticed; or the opposite being so fidgety that others notice -   Thoughts of being better off dead, or hurting yourself in some way -   PHQ 9 Score -   How difficult have these problems made it for you to do your work, take care of your home and get along with others -       Learning Assessment:  Learning Assessment 02/23/2018   PRIMARY LEARNER Patient   HIGHEST LEVEL OF EDUCATION - PRIMARY LEARNER  2 YEARS OF COLLEGE   BARRIERS PRIMARY LEARNER NONE   CO-LEARNER CAREGIVER No   PRIMARY LANGUAGE ENGLISH   LEARNER PREFERENCE PRIMARY LISTENING   ANSWERED BY patient   RELATIONSHIP SELF       Abuse Screening:  Abuse Screening Questionnaire 10/23/2018   Do you ever feel afraid of your partner? N   Are you in a relationship with someone who physically or mentally threatens you? N   Is it safe for you to go home? Y       Fall Risk  Fall Risk Assessment, last 12 mths 02/23/2018   Able to walk? Yes   Fall in past 12 months? No       Health Maintenance reviewed and discussed and ordered per Provider.    Health Maintenance Due   Topic Date Due   ??? PAP AKA CERVICAL CYTOLOGY  11/27/1988   ??? Shingrix Vaccine Age 29> (1 of 2) 11/27/2017    ??? FOBT Q1Y Age 29-75  11/27/2017   ??? Influenza Age 63 to Adult  05/14/2018   .      Coordination of Care:  1. Have you been to the ER, urgent care clinic since your last visit? Hospitalized since your last visit? no    2. Have you seen or consulted any other health care providers outside of the [facility] since your last visit? Include any pap smears or colon screening. no      Last PMP Checked na  Last UDS Checked na  Last Pain contract signed: na

## 2018-12-28 NOTE — Telephone Encounter (Signed)
Called patient left message to return my call in regards to her medication.

## 2018-12-31 ENCOUNTER — Encounter: Attending: Surgery | Primary: Nurse Practitioner

## 2018-12-31 ENCOUNTER — Inpatient Hospital Stay: Payer: PRIVATE HEALTH INSURANCE | Attending: Hematology & Oncology | Primary: Nurse Practitioner

## 2018-12-31 ENCOUNTER — Encounter: Primary: Nurse Practitioner

## 2018-12-31 NOTE — Progress Notes (Deleted)
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 Ronnald Ramp 06/10/17 as well as a remote history of right breast cancer in 2008, s/p mastectomy.  Her Oncotype returned as 13 with an 8% 5 year recurrence with Tamoxifen alone. She has been started on ovarian suppression and aromatase inhibitor (Lupron and Femara) per Dr. Brent General.     She initially presented August 2018  for evaluation regarding a new diagnosis of a left breast cancer.  The patient had a recent left breast mammogram that showed an area of calcifications and possibly small mass in about the 1130 position 3 cm from the nipple measuring about 1.8 cm.  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  The tumor was estrogen and progesterone receptor positive, HER-2 negative.    Of note in 2008 the patient developed right breast cancer.  She believes she had gene testing at that time when she was 53 years old.  She underwent a mastectomy and had neoadjuvant chemotherapy.  Subsequently she had radiation therapy.  She apparently had some latissimus dorsi flap reconstruction but this failed and then subsequent to that she had an implant.  Subsequent to that she had a reduction mammoplasty of the left breast.  She has not been aware of any recurrent disease since her first cancer until the development of this new cancer in the other breast.  She states a pill was not recommended at that time.    She underwent revision of scar with excision of necrotic tissue and removal of expander by Dr. Ronnald Ramp 07/09/17 and subsequent submuscular placement 09/17/17 followed by removal of tissue expanders on both sides with placement of right permanent breast prosthesis 04/01/18.      She is taking Femara and Lupron per Dr. Brent General. She is overall doing well and is without complaint related to her breast.          Allergies   Allergen Reactions   ??? Tramadol Anaphylaxis   ??? Metformin Diarrhea   ??? Trulicity [Dulaglutide] Nausea Only     Past Medical History:   Diagnosis Date   ??? Back pain    ??? Breast cancer ([place]) 2008 or 2009   ??? Chronic pain    ??? Diabetes ([place])    ??? FH: chemotherapy    ??? Hypertension    ??? Nausea & vomiting    ??? Neuropathy    ??? Radiation therapy complication 2440    NOT complication, just radiation     Past Surgical History:   Procedure Laterality Date   ??? BIOPSY BREAST     ??? BIOPSY FINE NEEDLE     ??? HX BREAST AUGMENTATION Right 2010   ??? HX BREAST AUGMENTATION Left 09/17/2017    left breast reconstruction with exchange of tissue expander per breast prostesis and left breast tissue expander performed by [name], MD at [place]   ??? HX BREAST AUGMENTATION Right 04/01/2018    removal of tissue expanders with placment of right permanent breast prostesis performed by [name], MD at [place]   ??? HX BREAST RECONSTRUCTION Right 2011    per pt - attempted latissimus dorsi flap, then saline implant   ??? HX BREAST RECONSTRUCTION Bilateral 06/10/2017    right breast prosthesis and  capsule removal and bilateral breast tissue  expander reconstruction.  performed by [name], MD at [place]   ??? HX BREAST RECONSTRUCTION Left 07/09/2017    left breast  EXCISION and closure of wound performed by [name], MD at [place]   ??? HX BREAST RECONSTRUCTION Bilateral     Tissue expanders, to prepare for implants    ??? HX CESAREAN SECTION     ??? HX LUMBAR DISKECTOMY      herniated disc   ??? HX MASTECTOMY Right 2010    Total mastectomy per pt.     ??? HX MASTECTOMY Left 06/10/2017    left mastectomy,Sentinal node biopsies. performed by [name], MD at [facility] MAIN OR   ??? HX MASTECTOMY Bilateral    ??? HX MASTOPEXY (BREAST LIFT) Left 2011    ??? IMPLANT BREAST SILICONE/EQ Right 5638    post mastectomy   ??? THUMB SUPPORT      thumb surgery to mobilize thumb     Social History     Socioeconomic History   ??? Marital status: MARRIED     Spouse name: Not on file   ??? Number of children: Not on file   ??? Years of education: Not on file   ??? Highest education level: Not on file   Occupational History   ??? Occupation: Psychologist, sport and exercise   Tobacco Use   ??? Smoking status: Current Every Day Smoker     Packs/day: 0.50     Years: 15.00     Pack years: 7.50   ??? Smokeless tobacco: Never Used   ??? Tobacco comment: 5-6 cigs per day patient education given   Substance and Sexual Activity   ??? Alcohol use: No   ??? Drug use: No   Other Topics Concern     REVIEW OF SYSTEMS     Constitutional: No fever, weight loss, fatigue or recent chills.   Skin:  No recent rashes, dermatitis or abnormal moles.   HEENT:  No changes in vision, vertigo, epistaxis, dysphasia, or hoarseness.   Cardiac:  No chest pain, palpitations, or edema.    Respiratory: No chronic cough, shortness of breath, wheezing, hemoptysis, or history of sleep apnea.  Patient has been a longtime smoker and still smokes about 1 pack of cigarettes per day-probable mild COPD   Breasts/GYN:   See the history of present illness   Gastrointestinal:  No significant food intolerances, no recent vomiting, no chronic abdominal pain, no change in bowel habits, no melena. No history of GERD.   Genitourinary:  No history of hematuria, dysuria, frequency, or stress urinary incontinence. No nocturia.   Musculoskeletal: No weakness, joint pains, or arthritis.  Patient does have a history of chronic back pain   Endocrine:  No history of thyroid disease.  History of insulin-dependent diabetes with neuropathy.  Patient's previous hemoglobin A1c was over 8   Lymph/hemo:  No history of blood transfusions or easy bruising. No anemia.  Patient states that she had "blood clots" with previous surgeries but she pointed to her chest    Neuro: No dizziness or headaches or fainting.  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    PHYSICAL EXAM    There were no vitals taken for this visit.       Constitutional:  Well-developed, well-nourished, no acute distress.   Breasts:   Right: Examined in both the supine and upright positions.  There was no supraclavicular, infraclavicular, or axillary lympadenopathy.   There  were no dominant masses, no skin changes, no asymmetry identified s/p mastectomy, implant intact, ***  Left: Examined in both the supine and upright positions.  There was no supraclavicular, infraclavicular, or axillary lympadenopathy.   There were no dominant masses, no skin changes, no asymmetry identified s/p mastectomy, no reconstruction,  ***      Pathology:  06/10/17   A: SENTINEL LYMPH NODE #1, LEFT AXILLA, BIOPSY:   ONE LYMPH NODE, NEGATIVE FOR MALIGNANCY (0/1).   B: SENTINEL LYMPH NODE #2, LEFT AXILLA, BIOPSY:   ONE LYMPH NODE, NEGATIVE FOR MALIGNANCY (0/1).   C: LEFT BREAST, SIMPLE MASTECTOMY:   INVASIVE CARCINOMA OF THE BREAST.   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   Nuclear Grade: Grade II (intermediate)   Lobular Carcinoma In Situ (LCIS): Present   Tumor Extent   Nipple DCIS: DCIS does not involve the nipple epidermis   Accessory Findings   Treatment Effect in the Breast: No known presurgical therapy   Treatment Effect in the Lymph Nodes: No known presurgical therapy   MARGINS   Invasive Carcinoma Margins: Uninvolved by invasive carcinoma    Distance from Closest Margin in Millimeters: Distance is > 10 Millimeters   Closest Margin: Posterior   DCIS Margins: Uninvolved by DCIS   Distance of DCIS from Closest Margin in Millimeters: Distance is > 10 Millimeters   Closest Margin: Posterior   LYMPH NODES   Number of Lymph Nodes with Macrometastases (> 2 mm): 0   Number of Lymph Nodes with Micrometastases (> 0.2 mm to 2 mm and / or > 200 cells): 0   Number of Lymph Nodes with Isolated Tumor Cells (<= 0.2 mm and <= 200 cells): 0   Number of Lymph Nodes Examined: 2 (see above)   Number of Sentinel Nodes Examined: 2   PATHOLOGIC STAGE CLASSIFICATION (pTNM, AJCC 8th Edition)   Primary Tumor (Invasive Carcinoma) (pT): pT1c   Regional Lymph Nodes (pN)   Modifier: (sn): Only sentinel node(s) evaluated.   Category (pN): pN0   D: RIGHT BREAST, CAPSULECTOMY:   BENIGN SOFT TISSUE IDENTIFIED.   E: IMPLANT, RIGHT BREAST, REMOVAL:   BREAST PROSTHESIS IDENTIFIED (GROSS EXAMINATION ONLY).       05/16/17   LEFT BREAST MASS AT 11:30 O'CLOCK (2-3 CM FROM NIPPLE), CORE BIOPSIES:   INFILTRATING CARCINOMA, NUCLEAR GRADE 1, WITH MIXED DUCTAL AND LOBULAR   FEATURES.   ESTROGEN RECEPTOR: POSITIVE (100%) POSITIVE > 1%   PROGESTERONE RECEPTOR: POSITIVE (100%) POSITIVE > 1%   Negative for Her-2 amplification       Patient Active Problem List   Diagnosis Code   ??? Essential hypertension I10   ??? 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 Pain of left hip joint M25.552   ??? Low back pain M54.5   ??? Smoker F17.200   ??? Dupuytren contracture M72.0   ??? S/P mastectomy, left Z90.12   ??? Cigarette nicotine dependence without complication O27.035   ??? Recurrent depression (HCC) F33.9   ??? 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 Ronnald Ramp 06/10/17 as well as a history of  right breast cancer in 2008. Continue ovarian suppression and aromatase inhibitor (Lupron and Femara) per Dr. Brent General.  She is scheduld for CT chest/abdomen/pelvis and bone scan January 04, 2019. ***    Tobacco cessation again recommended.  Follow up 6 months.  Please call sooner with questions or concerns.

## 2019-01-04 ENCOUNTER — Inpatient Hospital Stay: Admit: 2019-01-04 | Payer: PRIVATE HEALTH INSURANCE | Attending: Hematology & Oncology | Primary: Nurse Practitioner

## 2019-01-04 DIAGNOSIS — C50412 Malignant neoplasm of upper-outer quadrant of left female breast: Secondary | ICD-10-CM

## 2019-01-04 MED ORDER — TECHNETIUM TC 99M MEDRONATE IV KIT
Freq: Once | Status: AC
Start: 2019-01-04 — End: 2019-01-04
  Administered 2019-01-04: 14:00:00 via INTRAVENOUS

## 2019-01-04 MED ORDER — IOHEXOL 240 MG/ML IV SOLN
240 mg iodine/mL | Freq: Once | INTRAVENOUS | Status: AC
Start: 2019-01-04 — End: 2019-01-04
  Administered 2019-01-04: 15:00:00 via ORAL

## 2019-01-04 MED ORDER — IOPAMIDOL 61 % IV SOLN
30061 mg iodine /mL (61 %) | Freq: Once | INTRAVENOUS | Status: AC
Start: 2019-01-04 — End: 2019-01-04
  Administered 2019-01-04: 15:00:00 via INTRAVENOUS

## 2019-01-04 MED FILL — OMNIPAQUE 240 MG IODINE/ML INTRAVENOUS SOLUTION: 240 mg iodine/mL | INTRAVENOUS | Qty: 50

## 2019-01-04 MED FILL — ISOVUE-300  61 % INTRAVENOUS SOLUTION: 300 mg iodine /mL (61 %) | INTRAVENOUS | Qty: 100

## 2019-01-21 ENCOUNTER — Encounter: Attending: Surgery | Primary: Nurse Practitioner

## 2019-01-25 ENCOUNTER — Telehealth: Attending: Surgery | Primary: Nurse Practitioner

## 2019-01-25 ENCOUNTER — Telehealth
Admit: 2019-01-25 | Discharge: 2019-01-25 | Payer: PRIVATE HEALTH INSURANCE | Attending: Surgery | Primary: Nurse Practitioner

## 2019-01-25 DIAGNOSIS — C50412 Malignant neoplasm of upper-outer quadrant of left female breast: Secondary | ICD-10-CM

## 2019-01-25 DIAGNOSIS — Z170 Estrogen receptor positive status [ER+]: Secondary | ICD-10-CM

## 2019-01-25 NOTE — Progress Notes (Signed)
1. Have you been to the ER, urgent care clinic since your last visit?  Hospitalized since your last visit?No    2. Have you seen or consulted any other health care providers outside of the [facility] since your last visit?  Include any pap smears or colon screening. No     Confirmed patient name and DOB; visit took place over the phone with patient's consent. Confirmed that she is using a smart phone.

## 2019-01-25 NOTE — Progress Notes (Signed)
Progress Notes by Reita Cliche, MD at 01/25/19 0930                Author: Reita Cliche, MD  Service: --  Author Type: Physician       Filed: 01/25/19 0957  Encounter Date: 01/25/2019  Status: Signed          Editor: Reita Cliche, MD (Physician)                    Breat Cancer          Consent: Barbara [name], who was seen by synchronous (real-time) audio-video technology, and/or  her healthcare decision maker, is aware that this patient-initiated, Telehealth encounter on 01/25/2019  is a billable service, with coverage as determined by her insurance carrier. She  is aware that she may receive a bill and has provided verbal consent to proceed: Yes .  (During TIRWE-31 public health emergency),               Barbara [name] is a 53 y.o.  female being evaluated by a Virtual Visit (video visit) encounter to address concerns as mentioned above.  A caregiver was present when appropriate. Due to this being a Scientist, physiological  (During VQMGQ-67 public health emergency), evaluation of the following organ systems was limited: Vitals/Constitutional/EENT/Resp/CV/GI/GU/MS/Neuro/Skin/Heme-Lymph-Imm.  Pursuant to the emergency declaration  under the [place] waiver authority and the [employer] Act, this Virtual Visit was conducted with patient's (and/or legal guardian's) consent, to reduce  the risk of exposure to COVID-19 and provide necessary medical care.        Services were provided through a video synchronous discussion virtually to substitute for in-person encounter.      Method of telehealth service:  [employer]   Verbal consent as above   Location of physician:  [facility], [address], [place] [address]      [facility], [address] [place], [place] [address]   Location of patient: home   Other person participating in the  telehealth services:  Franchot Gallo, LPN, witness to Virtual Visit   Start time: 9:21   Stop time:9:35      --Reita Cliche, MD on 01/25/2019 at 9:52 AM      An electronic signature was used to authenticate this note.                    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 Ronnald Ramp 06/10/17 as well as a remote history of right breast cancer in 2008, s/p mastectomy.  Her Oncotype returned as 13 with an 8% 5 year recurrence with Tamoxifen alone. She has been started on  ovarian suppression and aromatase inhibitor (Lupron and Femara) per Dr. Brent General.       She initially presented August 2018  for evaluation regarding a new diagnosis of a left breast cancer.  The patient had a recent left breast mammogram that showed an area of calcifications and possibly small mass in about the 1130 position 3 cm from the  nipple measuring about 1.8 cm.  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  The tumor was  estrogen and progesterone receptor positive, HER-2 negative.      Of note in 2008 the patient developed right breast cancer.  She believes she had gene testing at that time when she was 53 years old.  She underwent a mastectomy and had neoadjuvant chemotherapy.  Subsequently she had radiation therapy.  She apparently  had some latissimus dorsi flap reconstruction but this failed and then subsequent to that she had an implant.  Subsequent to that she had a reduction mammoplasty of the left breast.  She has not been aware of any recurrent disease since her first cancer  until the development of this new cancer in the other breast.  She states a pill was not recommended at that time.      She underwent revision of scar with excision of necrotic tissue and removal of expander by Dr.  Ronnald Ramp 07/09/17 and subsequent submuscular placement 09/17/17 followed by removal  of tissue expanders on both sides with placement of right permanent breast prosthesis 04/01/18.       She is taking Femara and Lupron per Dr. Brent General. She is overall doing well and is without complaint related to her breast.                 Allergies        Allergen  Reactions         ?  Tramadol  Anaphylaxis     ?  Metformin  Diarrhea         ?  Trulicity [Dulaglutide]  Nausea Only          Past Medical History:        Diagnosis  Date         ?  Back pain       ?  Breast cancer ([place])  2008 or 2009     ?  Chronic pain       ?  Diabetes ([place])       ?  FH: chemotherapy       ?  Hypertension       ?  Nausea & vomiting       ?  Neuropathy       ?  Radiation therapy complication  4268          NOT complication, just radiation          Past Surgical History:         Procedure  Laterality  Date          ?  BIOPSY BREAST         ?  BIOPSY FINE NEEDLE         ?  HX BREAST AUGMENTATION  Right  2010     ?  HX BREAST AUGMENTATION  Left  09/17/2017          left breast reconstruction with exchange of tissue expander per breast prostesis and left breast tissue expander performed by [name],  MD at [place]          ?  HX BREAST AUGMENTATION  Right  04/01/2018          removal of tissue expanders with placment of right permanent breast prostesis performed by [name], MD at [place]          ?  HX BREAST RECONSTRUCTION  Right  2011          per pt - attempted latissimus dorsi flap, then saline implant          ?  HX BREAST RECONSTRUCTION  Bilateral  06/10/2017          right breast prosthesis and capsule removal and bilateral breast tissue  expander reconstruction.  performed by [name], MD at [place]          ?  HX BREAST RECONSTRUCTION  Left  07/09/2017          left breast  EXCISION and closure of wound performed by [name], MD at [place]          ?  HX BREAST RECONSTRUCTION  Bilateral             Tissue expanders, to prepare for implants           ?  HX CESAREAN SECTION         ?  HX LUMBAR DISKECTOMY              herniated disc          ?  HX MASTECTOMY  Right  2010          Total mastectomy per pt.            ?  HX MASTECTOMY  Left  06/10/2017          left mastectomy,Sentinal node biopsies. performed by [name], MD at [place]          ?  HX MASTECTOMY  Bilateral       ?  HX MASTOPEXY (BREAST LIFT)  Left  2011     ?  IMPLANT BREAST SILICONE/EQ  Right  2951          post mastectomy          ?  THUMB SUPPORT              thumb surgery to mobilize thumb          Social History          Socioeconomic History         ?  Marital status:  MARRIED              Spouse name:  Not on file         ?  Number of children:  Not on file     ?  Years of education:  Not on file     ?  Highest education level:  Not on file       Occupational History         ?  Occupation:  Psychologist, sport and exercise       Tobacco Use         ?  Smoking status:  Current Some Day Smoker              Packs/day:  0.50         Years:  15.00         Pack years:  7.50         Types:  Cigarettes         ?  Smokeless tobacco:  Never Used        ?  Tobacco comment: 5-6 cigs per day patient education given       Substance and Sexual Activity         ?  Alcohol use:  No     ?  Drug use:  No        Other Topics  Concern        REVIEW  OF SYSTEMS       Constitutional: No fever, weight loss, fatigue or recent chills.    Skin:  No recent rashes, dermatitis or abnormal moles.    HEENT:  No changes in vision, vertigo, epistaxis, dysphasia, or hoarseness.    Cardiac:  No chest pain, palpitations, or edema.     Respiratory: No chronic cough, shortness of breath, wheezing, hemoptysis, or history of sleep apnea.  Patient has been a longtime smoker and still smokes about 1 pack of cigarettes per day-probable mild COPD    Breasts/GYN:   See the history of present illness    Gastrointestinal:  No significant food intolerances, no recent vomiting, no chronic  abdominal pain, no change in bowel habits, no melena. No history of GERD.    Genitourinary:  No history of hematuria, dysuria, frequency, or stress urinary incontinence. No nocturia.    Musculoskeletal: No weakness, joint pains, or arthritis.  Patient does have a history of chronic back pain    Endocrine:  No history of thyroid disease.  History of insulin-dependent diabetes with neuropathy.  Patient's previous hemoglobin A1c was over 8    Lymph/hemo:  No history of blood transfusions or easy bruising. No anemia.  Patient states that she had "blood clots" with previous surgeries but she pointed to her chest    Neuro: No dizziness or headaches or fainting.  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      PHYSICAL EXAM      Visit Vitals      Ht  5' 2" (1.575 m)     Wt  62.1 kg (137 lb)        BMI  25.06 kg/m??               Constitutional:  Well-developed, well-nourished, no acute distress.  Resp: No increased work of breathing      Imaging:   01/04/19 CT/bone scan   1. No evidence for metastatic disease within the chest, abdomen, or pelvis.   ??   2. Cholelithiasis.   ??   No evidence of osseous metastatic disease.      Pathology:   06/10/17    A: SENTINEL LYMPH NODE #1, LEFT AXILLA, BIOPSY:    ONE LYMPH NODE, NEGATIVE FOR MALIGNANCY (0/1).    B: SENTINEL LYMPH NODE #2, LEFT AXILLA, BIOPSY:    ONE LYMPH NODE, NEGATIVE FOR MALIGNANCY (0/1).    C: LEFT BREAST, SIMPLE MASTECTOMY:    INVASIVE CARCINOMA OF THE BREAST.    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    Nuclear Grade: Grade II (intermediate)     Lobular Carcinoma In Situ (LCIS): Present    Tumor Extent    Nipple DCIS: DCIS does not involve the nipple epidermis    Accessory Findings    Treatment Effect in the Breast: No known presurgical therapy    Treatment Effect in the Lymph Nodes: No known presurgical therapy    MARGINS    Invasive Carcinoma Margins: Uninvolved  by invasive carcinoma    Distance from Closest Margin in Millimeters: Distance is > 10 Millimeters    Closest Margin: Posterior    DCIS Margins: Uninvolved by DCIS    Distance of DCIS from Closest Margin in Millimeters: Distance is > 10 Millimeters    Closest Margin: Posterior    LYMPH NODES    Number of Lymph Nodes with Macrometastases (> 2 mm): 0    Number of Lymph Nodes with Micrometastases (> 0.2 mm to 2 mm and / or > 200 cells): 0    Number of Lymph Nodes with Isolated Tumor Cells (<= 0.2 mm and <= 200 cells): 0    Number of Lymph Nodes Examined: 2 (see above)    Number of Sentinel Nodes Examined: 2    PATHOLOGIC STAGE CLASSIFICATION (pTNM, AJCC 8th Edition)    Primary Tumor (Invasive Carcinoma) (pT): pT1c    Regional Lymph Nodes (pN)    Modifier: (sn): Only sentinel node(s) evaluated.    Category (pN): pN0    D: RIGHT BREAST, CAPSULECTOMY:    BENIGN SOFT TISSUE IDENTIFIED.    E: IMPLANT, RIGHT BREAST, REMOVAL:    BREAST PROSTHESIS IDENTIFIED (GROSS EXAMINATION ONLY).          05/16/17    LEFT BREAST MASS AT 11:30 O'CLOCK (2-3 CM FROM NIPPLE), CORE BIOPSIES:    INFILTRATING CARCINOMA, NUCLEAR GRADE 1, WITH MIXED DUCTAL AND LOBULAR    FEATURES.    ESTROGEN RECEPTOR: POSITIVE (100%) POSITIVE > 1%    PROGESTERONE RECEPTOR: POSITIVE (100%) POSITIVE > 1%    Negative for Her-2 amplification            Patient Active Problem List        Diagnosis  Code         ?  Essential hypertension  I10     ?  DM type 2, uncontrolled, with neuropathy ([place])  E11.40, E11.65     ?  Pain of left hip joint  M25.552     ?  Low back pain  M54.5     ?  Smoker  F17.200     ?  Dupuytren contracture  M72.0     ?  S/P  mastectomy, left  Z90.12     ?  Cigarette nicotine dependence without complication  U76.546     ?  Recurrent depression ([place])  F33.9         ?  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 Ronnald Ramp 06/10/17 as well as a history of right breast cancer in 2008. Continue  ovarian suppression and aromatase inhibitor (Lupron and Femara) per Dr. Brent General.  We reviewed CT chest/abdomen/pelvis and bone scan from January 04, 2019.     Tobacco cessation again recommended. I have recommended bilateral breast exam once safe for in-office  visits per COVID.  Otherwise, will plan routine follow up 6 months.  Please call sooner with questions or concerns.

## 2019-01-25 NOTE — Progress Notes (Signed)
1. Have you been to the ER, urgent care clinic since your last visit?  Hospitalized since your last visit?No    2. Have you seen or consulted any other health care providers outside of the [facility] since your last visit?  Include any pap smears or colon screening. No     Confirmed patient name and DOB; visit took place over the phone with patient's consent. Confirmed that she is using a smart phone.

## 2019-01-25 NOTE — Progress Notes (Signed)
Breat Cancer       Consent: Barbara [name], who was seen by synchronous (real-time) audio-video technology, and/or her healthcare decision maker, is aware that this patient-initiated, Telehealth encounter on 01/25/2019 is a billable service, with coverage as determined by her insurance carrier. She is aware that she may receive a bill and has provided verbal consent to proceed: Yes.  (During AVWUJ-81 public health emergency),           Barbara [name] is a 53 y.o. female being evaluated by a Virtual Visit (video visit) encounter to address concerns as mentioned above.  A caregiver was present when appropriate. Due to this being a Scientist, physiological (During XBJYN-82 public health emergency), evaluation of the following organ systems was limited: Vitals/Constitutional/EENT/Resp/CV/GI/GU/MS/Neuro/Skin/Heme-Lymph-Imm.  Pursuant to the emergency declaration under the [place], [place] waiver authority and the [employer] Act, this Virtual Visit was conducted with patient's (and/or legal guardian's) consent, to reduce the risk of exposure to COVID-19 and provide necessary medical care.      Services were provided through a video synchronous discussion virtually to substitute for in-person encounter.    Method of telehealth service:  [employer]  Verbal consent as above  Location of physician:  [facility], [address], [place] [address]     [facility], [address], [place], [place] [address]  Location of patient: home  Other person participating in the telehealth services:  Franchot Gallo, LPN, witness to Virtual Visit  Start time: 9:21  Stop time:9:35    --Barbara Cliche, MD on 01/25/2019 at 9:52 AM    An electronic signature was used to authenticate this note.              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 Ronnald Ramp 06/10/17 as well as a remote history of right breast cancer in 2008, s/p mastectomy.  Her Oncotype returned as 13 with an 8% 5 year recurrence with Tamoxifen alone. She has been started on ovarian suppression and aromatase inhibitor (Lupron and Femara) per Dr. Brent General.     She initially presented August 2018  for evaluation regarding a new diagnosis of a left breast cancer.  The patient had a recent left breast mammogram that showed an area of calcifications and possibly small mass in about the 1130 position 3 cm from the nipple measuring about 1.8 cm.  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  The tumor was estrogen and progesterone receptor positive, HER-2 negative.    Of note in 2008 the patient developed right breast cancer.  She believes she had gene testing at that time when she was 53 years old.  She underwent a mastectomy and had neoadjuvant chemotherapy.  Subsequently she had radiation therapy.  She apparently had some latissimus dorsi flap reconstruction but this failed and then subsequent to that she had an implant.  Subsequent to that she had a reduction mammoplasty of the left breast.  She has not been aware of any recurrent disease since her first cancer until the development of this new cancer in the other breast.  She states a pill was not recommended at that time.    She underwent revision of  scar with excision of necrotic tissue and removal of expander by Dr. Ronnald Ramp 07/09/17 and subsequent submuscular placement 09/17/17 followed by removal of tissue expanders on both sides with placement of right permanent breast prosthesis 04/01/18.     She is taking Femara and Lupron per Dr. Brent General. She is overall doing well and is without complaint related to her breast.          Allergies    Allergen Reactions   ??? Tramadol Anaphylaxis   ??? Metformin Diarrhea   ??? Trulicity [Dulaglutide] Nausea Only     Past Medical History:   Diagnosis Date   ??? Back pain    ??? Breast cancer ([place]) 2008 or 2009   ??? Chronic pain    ??? Diabetes ([place])    ??? FH: chemotherapy    ??? Hypertension    ??? Nausea & vomiting    ??? Neuropathy    ??? Radiation therapy complication 6440    NOT complication, just radiation     Past Surgical History:   Procedure Laterality Date   ??? BIOPSY BREAST     ??? BIOPSY FINE NEEDLE     ??? HX BREAST AUGMENTATION Right 2010   ??? HX BREAST AUGMENTATION Left 09/17/2017    left breast reconstruction with exchange of tissue expander per breast prostesis and left breast tissue expander performed by [name], MD at [place]   ??? HX BREAST AUGMENTATION Right 04/01/2018    removal of tissue expanders with placment of right permanent breast prostesis performed by [name], MD at [place]   ??? HX BREAST RECONSTRUCTION Right 2011    per pt - attempted latissimus dorsi flap, then saline implant   ??? HX BREAST RECONSTRUCTION Bilateral 06/10/2017    right breast prosthesis and capsule removal and bilateral breast tissue  expander reconstruction.  performed by [name], MD at [place]   ??? HX BREAST RECONSTRUCTION Left 07/09/2017    left breast  EXCISION and closure of wound performed by [name], MD at [place]   ??? HX BREAST RECONSTRUCTION Bilateral     Tissue expanders, to prepare for implants    ??? HX CESAREAN SECTION     ??? HX LUMBAR DISKECTOMY      herniated disc   ??? HX MASTECTOMY Right 2010    Total mastectomy per pt.     ??? HX MASTECTOMY Left 06/10/2017    left mastectomy,Sentinal node biopsies. performed by [name], MD at [facility] MA[facility]   ??? HX MASTECTOMY Bilateral    ??? HX MASTOPEXY (BREAST LIFT) Left 2011   ??? IMPLANT BREAST SILICONE/EQ Right 3474    post mastectomy   ??? THUMB SUPPORT      thumb surgery to mobilize thumb     Social History     Socioeconomic History    ??? Marital status: MARRIED     Spouse name: Not on file   ??? Number of children: Not on file   ??? Years of education: Not on file   ??? Highest education level: Not on file   Occupational History   ??? Occupation: Psychologist, sport and exercise   Tobacco Use   ??? Smoking status: Current Some Day Smoker     Packs/day: 0.50     Years: 15.00     Pack years: 7.50     Types: Cigarettes   ??? Smokeless tobacco: Never Used   ??? Tobacco comment: 5-6 cigs per day patient education given  Substance and Sexual Activity   ??? Alcohol use: No   ??? Drug use: No   Other Topics Concern     REVIEW OF SYSTEMS     Constitutional: No fever, weight loss, fatigue or recent chills.   Skin:  No recent rashes, dermatitis or abnormal moles.   HEENT:  No changes in vision, vertigo, epistaxis, dysphasia, or hoarseness.   Cardiac:  No chest pain, palpitations, or edema.    Respiratory: No chronic cough, shortness of breath, wheezing, hemoptysis, or history of sleep apnea.  Patient has been a longtime smoker and still smokes about 1 pack of cigarettes per day-probable mild COPD   Breasts/GYN:   See the history of present illness   Gastrointestinal:  No significant food intolerances, no recent vomiting, no chronic abdominal pain, no change in bowel habits, no melena. No history of GERD.   Genitourinary:  No history of hematuria, dysuria, frequency, or stress urinary incontinence. No nocturia.   Musculoskeletal: No weakness, joint pains, or arthritis.  Patient does have a history of chronic back pain   Endocrine:  No history of thyroid disease.  History of insulin-dependent diabetes with neuropathy.  Patient's previous hemoglobin A1c was over 8   Lymph/hemo:  No history of blood transfusions or easy bruising. No anemia.  Patient states that she had "blood clots" with previous surgeries but she pointed to her chest   Neuro: No dizziness or headaches or fainting.  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    PHYSICAL EXAM    Visit Vitals   Ht 5' 2"  (1.575 m)   Wt 62.1 kg (137 lb)   BMI 25.06 kg/m??          Constitutional:  Well-developed, well-nourished, no acute distress.  Resp: No increased work of breathing    Imaging:  01/04/19 CT/bone scan  1. No evidence for metastatic disease within the chest, abdomen, or pelvis.  ??  2. Cholelithiasis.  ??  No evidence of osseous metastatic disease.    Pathology:  06/10/17   A: SENTINEL LYMPH NODE #1, LEFT AXILLA, BIOPSY:   ONE LYMPH NODE, NEGATIVE FOR MALIGNANCY (0/1).   B: SENTINEL LYMPH NODE #2, LEFT AXILLA, BIOPSY:   ONE LYMPH NODE, NEGATIVE FOR MALIGNANCY (0/1).   C: LEFT BREAST, SIMPLE MASTECTOMY:   INVASIVE CARCINOMA OF THE BREAST.   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   Nuclear Grade: Grade II (intermediate)   Lobular Carcinoma In Situ (LCIS): Present   Tumor Extent   Nipple DCIS: DCIS does not involve the nipple epidermis   Accessory Findings   Treatment Effect in the Breast: No known presurgical therapy   Treatment Effect in the Lymph Nodes: No known presurgical therapy   MARGINS   Invasive Carcinoma Margins: Uninvolved by invasive carcinoma   Distance from Closest Margin in Millimeters: Distance is > 10 Millimeters   Closest Margin: Posterior   DCIS Margins: Uninvolved by DCIS   Distance of DCIS from Closest Margin in Millimeters: Distance is > 10 Millimeters   Closest Margin: Posterior  LYMPH NODES   Number of Lymph Nodes with Macrometastases (> 2 mm): 0   Number of Lymph Nodes with Micrometastases (> 0.2 mm to 2 mm and / or > 200 cells): 0    Number of Lymph Nodes with Isolated Tumor Cells (<= 0.2 mm and <= 200 cells): 0   Number of Lymph Nodes Examined: 2 (see above)   Number of Sentinel Nodes Examined: 2   PATHOLOGIC STAGE CLASSIFICATION (pTNM, AJCC 8th Edition)   Primary Tumor (Invasive Carcinoma) (pT): pT1c   Regional Lymph Nodes (pN)   Modifier: (sn): Only sentinel node(s) evaluated.   Category (pN): pN0   D: RIGHT BREAST, CAPSULECTOMY:   BENIGN SOFT TISSUE IDENTIFIED.   E: IMPLANT, RIGHT BREAST, REMOVAL:   BREAST PROSTHESIS IDENTIFIED (GROSS EXAMINATION ONLY).       05/16/17   LEFT BREAST MASS AT 11:30 O'CLOCK (2-3 CM FROM NIPPLE), CORE BIOPSIES:   INFILTRATING CARCINOMA, NUCLEAR GRADE 1, WITH MIXED DUCTAL AND LOBULAR   FEATURES.   ESTROGEN RECEPTOR: POSITIVE (100%) POSITIVE > 1%   PROGESTERONE RECEPTOR: POSITIVE (100%) POSITIVE > 1%   Negative for Her-2 amplification       Patient Active Problem List   Diagnosis Code   ??? Essential hypertension I10   ??? DM type 2, uncontrolled, with neuropathy ([place]) E11.40, E11.65   ??? Pain of left hip joint M25.552   ??? Low back pain M54.5   ??? Smoker F17.200   ??? Dupuytren contracture M72.0   ??? S/P mastectomy, left Z90.12   ??? Cigarette nicotine dependence without complication P82.423   ??? Recurrent depression ([facility]) F33.9   ??? 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 Ronnald Ramp 06/10/17 as well as a history of right breast cancer in 2008. Continue ovarian suppression and aromatase inhibitor (Lupron and Femara) per Dr. Brent General.  We reviewed CT chest/abdomen/pelvis and bone scan from January 04, 2019.     Tobacco cessation again recommended. I have recommended bilateral breast exam once safe for in-office visits per COVID.  Otherwise, will plan routine follow up 6 months.  Please call sooner with questions or concerns.

## 2019-01-29 NOTE — Telephone Encounter (Signed)
Refill request will be addressed at time of appointment, provider will return in office on 02/01/2019.

## 2019-01-29 NOTE — Telephone Encounter (Signed)
Pt requesting a refill for freestyle test strip. Unable to find it in patient's record. Pt was also advise that controlled susbtance medication requires an appointment to be refilled by NP Isaiah Blakes. Pt scheduled to be seen on 02/01/2019 at 3pm.

## 2019-02-01 ENCOUNTER — Telehealth: Attending: Nurse Practitioner | Primary: Nurse Practitioner

## 2019-02-01 ENCOUNTER — Telehealth
Admit: 2019-02-01 | Discharge: 2019-02-02 | Payer: PRIVATE HEALTH INSURANCE | Attending: Nurse Practitioner | Primary: Nurse Practitioner

## 2019-02-01 DIAGNOSIS — E119 Type 2 diabetes mellitus without complications: Secondary | ICD-10-CM

## 2019-02-01 MED ORDER — TIZANIDINE 2 MG TAB
2 mg | ORAL_TABLET | Freq: Three times a day (TID) | ORAL | 2 refills | Status: DC | PRN
Start: 2019-02-01 — End: 2019-05-09

## 2019-02-01 MED ORDER — FREESTYLE LIBRE 14 DAY SENSOR KIT
PACK | 5 refills | Status: AC
Start: 2019-02-01 — End: ?

## 2019-02-01 MED ORDER — ALPRAZOLAM 0.5 MG TAB
0.5 mg | ORAL_TABLET | Freq: Two times a day (BID) | ORAL | 2 refills | Status: DC | PRN
Start: 2019-02-01 — End: 2019-05-13

## 2019-02-01 MED ORDER — BLOOD SUGAR DIAGNOSTIC TEST STRIPS
ORAL_STRIP | 1 refills | Status: DC
Start: 2019-02-01 — End: 2019-04-27

## 2019-02-01 MED ORDER — FREESTYLE LIBRE 14 DAY READER
0 refills | Status: AC
Start: 2019-02-01 — End: ?

## 2019-02-01 NOTE — Progress Notes (Signed)
Barbara [name] presents via dosyme for dm, back/neck pain     Pt's current location: Home    Coordination of Care:    1. Have you been to the ER, urgent care clinic since your last visit? Hospitalized since your last visit? No    2. Have you seen or consulted any other health care providers outside of the [facility] since your last visit? Include any pap smears or colon screening. Surgical specialists    Consent:  She and/or health care decision maker is aware that that she may receive a bill for this telephone service, depending on her insurance coverage, and has provided verbal consent to proceed: Yes     Depression Screening:  3 most recent PHQ Screens 02/23/2018   Little interest or pleasure in doing things Several days   Feeling down, depressed, irritable, or hopeless More than half the days   Total Score PHQ 2 3   Trouble falling or staying asleep, or sleeping too much -   Feeling tired or having little energy -   Poor appetite, weight loss, or overeating -   Feeling bad about yourself - or that you are a failure or have let yourself or your family down -   Trouble concentrating on things such as school, work, reading, or watching TV -   Moving or speaking so slowly that other people could have noticed; or the opposite being so fidgety that others notice -   Thoughts of being better off dead, or hurting yourself in some way -   PHQ 9 Score -   How difficult have these problems made it for you to do your work, take care of your home and get along with others -       Learning Assessment:  Learning Assessment 02/23/2018   PRIMARY LEARNER Patient   HIGHEST LEVEL OF EDUCATION - PRIMARY LEARNER  2 YEARS OF COLLEGE   BARRIERS PRIMARY LEARNER NONE   CO-LEARNER CAREGIVER No   PRIMARY LANGUAGE ENGLISH   LEARNER PREFERENCE PRIMARY LISTENING   ANSWERED BY patient   RELATIONSHIP SELF       Abuse Screening:  Abuse Screening Questionnaire 10/23/2018   Do you ever feel afraid of your partner? N   Are you in a  relationship with someone who physically or mentally threatens you? N   Is it safe for you to go home? Y       Fall Risk  Fall Risk Assessment, last 12 mths 02/23/2018   Able to walk? Yes   Fall in past 12 months? No       Health Maintenance reviewed and discussed and ordered per Provider.  Health Maintenance Due   Topic Date Due   . Colonoscopy  11/27/1985   . PAP AKA CERVICAL CYTOLOGY  11/27/1988   . Shingrix Vaccine Age 11> (1 of 2) 11/27/2017   . A1C test (Diabetic or Prediabetic)  02/03/2019

## 2019-02-01 NOTE — Progress Notes (Signed)
Barbara [name] is a 53 y.o. female evaluated via telephone on 02/01/2019.      Consent:  She and/or health care decision maker is aware that that she may receive a bill for this telephone service, depending on her insurance coverage, and has provided verbal consent to proceed: Yes    I was in the office while conducting this encounter.  The patient was at home.  There were no other participants.    Documentation:    Diabetes, type 2 follow up:  Taking medications as prescribed: Yes  Following diabetic diet: No  Exercise: No   Checking blood sugar: Yes  Between 120 and 317. Mostly in the 200s.  Symptoms of hyperglycemia: excessive thirst and polyuria  Symptoms of hypoglycemia: shakes if she skips a meal  Last A1C was 11.6.   Plan:  Reviewed diabetic diet in detail. She eats twice a day at the same times that she takes her insulin. Advised to limited her carbs to 30 g per meal. Increase glucose monitoring to 4 times a day.  Ordered freestyle libre.  Follow up in 4 weeks, sooner as needed.    Anxiety:   Chronic. Some days worse depending on the situation. Worse with the coronavirus pandemic. Takes 1/2 tablet of xanax up to twice a day which has been effective.  Plan:  Continue xanax as needed. Reviewed PMP Aware. Dispensing history is appropriate and congruent with patient reported history.     Breast cancer:   Taking letrozole. Followed by oncology.  Plan:   Continue onc f/u.    Neck pain:    Chronic lower back pain. Now reporting pain in the posterior neck. Aching. Worse with movement. Voltaren does not help.  Dx: Muscle spasm  Plan:  Start zanaflex prn. Cautioned that it may caused somnolence and sedation.  Stretches and gentle rom exercises. Topical heat.         I affirm this is a Patient Initiated Episode with an Established Patient who has not had a related appointment within my department in the past 7 days or scheduled within the next 24 hours.    Total Time: minutes: 21-30 minutes   23 minutes    Note: not  billable if this call serves to triage the patient into an appointment for the relevant concern      Toniann Ket, NP

## 2019-02-01 NOTE — Progress Notes (Signed)
Barbara [name] presents via dosyme for dm, back/neck pain     Pt's current location: Home    Coordination of Care:    1. Have you been to the ER, urgent care clinic since your last visit? Hospitalized since your last visit? No    2. Have you seen or consulted any other health care providers outside of the [facility] since your last visit? Include any pap smears or colon screening. Surgical specialists    Consent:  She and/or health care decision maker is aware that that she may receive a bill for this telephone service, depending on her insurance coverage, and has provided verbal consent to proceed: Yes     Depression Screening:  3 most recent PHQ Screens 02/23/2018   Little interest or pleasure in doing things Several days   Feeling down, depressed, irritable, or hopeless More than half the days   Total Score PHQ 2 3   Trouble falling or staying asleep, or sleeping too much -   Feeling tired or having little energy -   Poor appetite, weight loss, or overeating -   Feeling bad about yourself - or that you are a failure or have let yourself or your family down -   Trouble concentrating on things such as school, work, reading, or watching TV -   Moving or speaking so slowly that other people could have noticed; or the opposite being so fidgety that others notice -   Thoughts of being better off dead, or hurting yourself in some way -   PHQ 9 Score -   How difficult have these problems made it for you to do your work, take care of your home and get along with others -       Learning Assessment:  Learning Assessment 02/23/2018   PRIMARY LEARNER Patient   HIGHEST LEVEL OF EDUCATION - PRIMARY LEARNER  2 YEARS OF COLLEGE   BARRIERS PRIMARY LEARNER NONE   CO-LEARNER CAREGIVER No   PRIMARY LANGUAGE ENGLISH   LEARNER PREFERENCE PRIMARY LISTENING   ANSWERED BY patient   RELATIONSHIP SELF       Abuse Screening:  Abuse Screening Questionnaire 10/23/2018   Do you ever feel afraid of your partner? N    Are you in a relationship with someone who physically or mentally threatens you? N   Is it safe for you to go home? Y       Fall Risk  Fall Risk Assessment, last 12 mths 02/23/2018   Able to walk? Yes   Fall in past 12 months? No       Health Maintenance reviewed and discussed and ordered per Provider.  Health Maintenance Due   Topic Date Due   ??? Colonoscopy  11/27/1985   ??? PAP AKA CERVICAL CYTOLOGY  11/27/1988   ??? Shingrix Vaccine Age 73> (1 of 2) 11/27/2017   ??? A1C test (Diabetic or Prediabetic)  02/03/2019

## 2019-02-01 NOTE — Progress Notes (Signed)
Barbara [name] is a 53 y.o. female evaluated via telephone on 02/01/2019.      Consent:  She and/or health care decision maker is aware that that she may receive a bill for this telephone service, depending on her insurance coverage, and has provided verbal consent to proceed: Yes    I was in the office while conducting this encounter.  The patient was at home.  There were no other participants.    Documentation:    Diabetes, type 2 follow up:  Taking medications as prescribed: Yes  Following diabetic diet: No  Exercise: No   Checking blood sugar: Yes  Between 120 and 317. Mostly in the 200s.  Symptoms of hyperglycemia: excessive thirst and polyuria  Symptoms of hypoglycemia: shakes if she skips a meal  Last A1C was 11.6.   Plan:  Reviewed diabetic diet in detail. She eats twice a day at the same times that she takes her insulin. Advised to limited her carbs to 30 g per meal. Increase glucose monitoring to 4 times a day.  Ordered freestyle libre.  Follow up in 4 weeks, sooner as needed.    Anxiety:   Chronic. Some days worse depending on the situation. Worse with the coronavirus pandemic. Takes 1/2 tablet of xanax up to twice a day which has been effective.  Plan:  Continue xanax as needed. Reviewed PMP Aware. Dispensing history is appropriate and congruent with patient reported history.     Breast cancer:   Taking letrozole. Followed by oncology.  Plan:   Continue onc f/u.    Neck pain:    Chronic lower back pain. Now reporting pain in the posterior neck. Aching. Worse with movement. Voltaren does not help.  Dx: Muscle spasm  Plan:  Start zanaflex prn. Cautioned that it may caused somnolence and sedation.  Stretches and gentle rom exercises. Topical heat.         I affirm this is a Patient Initiated Episode with an Established Patient who has not had a related appointment within my department in the past 7 days or scheduled within the next 24 hours.    Total Time: minutes: 21-30 minutes   23 minutes     Note: not billable if this call serves to triage the patient into an appointment for the relevant concern      Toniann Ket, NP

## 2019-02-01 NOTE — Telephone Encounter (Signed)
Suber from [facility] is needing clarification on directions for Xanax. Please advise.

## 2019-02-02 NOTE — Telephone Encounter (Signed)
Left vm message for the pt to call our office to schedule her 6 month follow up with Dr. Vassie Moselle.

## 2019-02-02 NOTE — Telephone Encounter (Signed)
Order clarification given. This encounter will be closed.

## 2019-02-02 NOTE — Telephone Encounter (Signed)
Max amount is 0.5 mg as written on the prescription.

## 2019-02-02 NOTE — Telephone Encounter (Signed)
Please clarify max daily amount.

## 2019-02-15 ENCOUNTER — Encounter

## 2019-02-15 MED ORDER — CHOLECALCIFEROL (VITAMIN D3) 2,000 UNIT CAPSULE
ORAL_CAPSULE | Freq: Every day | ORAL | 3 refills | Status: AC
Start: 2019-02-15 — End: ?

## 2019-03-01 ENCOUNTER — Encounter: Attending: Nurse Practitioner | Primary: Nurse Practitioner

## 2019-03-02 ENCOUNTER — Telehealth: Attending: Nurse Practitioner | Primary: Nurse Practitioner

## 2019-03-02 ENCOUNTER — Telehealth: Admit: 2019-03-02 | Payer: PRIVATE HEALTH INSURANCE | Attending: Nurse Practitioner | Primary: Nurse Practitioner

## 2019-03-02 ENCOUNTER — Inpatient Hospital Stay
Admit: 2019-03-02 | Discharge: 2019-03-03 | Disposition: A | Discharge status: Home / Self Care | Payer: PRIVATE HEALTH INSURANCE | Attending: Emergency Medicine

## 2019-03-02 ENCOUNTER — Emergency Department: Admit: 2019-03-02 | Payer: PRIVATE HEALTH INSURANCE | Primary: Nurse Practitioner

## 2019-03-02 DIAGNOSIS — E119 Type 2 diabetes mellitus without complications: Secondary | ICD-10-CM

## 2019-03-02 DIAGNOSIS — M4722 Other spondylosis with radiculopathy, cervical region: Secondary | ICD-10-CM

## 2019-03-02 LAB — CBC WITH AUTOMATED DIFF
ABS. BASOPHILS: 0 10*3/uL (ref 0.0–0.1)
ABS. EOSINOPHILS: 0.2 10*3/uL (ref 0.0–0.4)
ABS. LYMPHOCYTES: 3.4 10*3/uL (ref 0.9–3.6)
ABS. MONOCYTES: 0.7 10*3/uL (ref 0.05–1.2)
ABS. NEUTROPHILS: 5.3 10*3/uL (ref 1.8–8.0)
BASOPHILS: 0 % (ref 0–2)
EOSINOPHILS: 2 % (ref 0–5)
HCT: 45.3 % — ABNORMAL HIGH (ref 35.0–45.0)
HGB: 15.9 g/dL (ref 12.0–16.0)
LYMPHOCYTES: 35 % (ref 21–52)
MCH: 32.8 PG (ref 24.0–34.0)
MCHC: 35.1 g/dL (ref 31.0–37.0)
MCV: 93.4 FL (ref 74.0–97.0)
MONOCYTES: 7 % (ref 3–10)
MPV: 10.1 FL (ref 9.2–11.8)
NEUTROPHILS: 56 % (ref 40–73)
PLATELET: 239 10*3/uL (ref 135–420)
RBC: 4.85 M/uL (ref 4.20–5.30)
RDW: 12.6 % (ref 11.6–14.5)
WBC: 9.7 10*3/uL (ref 4.6–13.2)

## 2019-03-02 LAB — METABOLIC PANEL, COMPREHENSIVE
A-G Ratio: 0.8 (ref 0.8–1.7)
ALT (SGPT): 29 U/L (ref 13–56)
AST (SGOT): 27 U/L (ref 10–38)
Albumin: 3.9 g/dL (ref 3.4–5.0)
Alk. phosphatase: 125 U/L — ABNORMAL HIGH (ref 45–117)
Anion gap: 2 mmol/L — ABNORMAL LOW (ref 3.0–18)
BUN/Creatinine ratio: 12 (ref 12–20)
BUN: 6 MG/DL — ABNORMAL LOW (ref 7.0–18)
Bilirubin, total: 0.4 MG/DL (ref 0.2–1.0)
CO2: 30 mmol/L (ref 21–32)
Calcium: 9.9 MG/DL (ref 8.5–10.1)
Chloride: 107 mmol/L (ref 100–111)
Creatinine: 0.52 MG/DL — ABNORMAL LOW (ref 0.6–1.3)
GFR est AA: >60 mL/min/{1.73_m2} (ref >60)
GFR est non-AA: >60 mL/min/{1.73_m2} (ref >60)
Globulin: 4.6 g/dL — ABNORMAL HIGH (ref 2.0–4.0)
Glucose: 119 mg/dL — ABNORMAL HIGH (ref 74–99)
Potassium: 3.9 mmol/L (ref 3.5–5.5)
Protein, total: 8.5 g/dL — ABNORMAL HIGH (ref 6.4–8.2)
Sodium: 139 mmol/L (ref 136–145)

## 2019-03-02 LAB — PROTHROMBIN TIME + INR
INR: 1.1 (ref 0.8–1.2)
Prothrombin time: 13.8 s (ref 11.5–15.2)

## 2019-03-02 LAB — CBC WITH AUTO DIFFERENTIAL
Basophils %: 0 % (ref 0–2)
Basophils Absolute: 0 10*3/uL (ref 0.0–0.1)
Eosinophils %: 2 % (ref 0–5)
Eosinophils Absolute: 0.2 10*3/uL (ref 0.0–0.4)
Hematocrit: 45.3 % — ABNORMAL HIGH (ref 35.0–45.0)
Hemoglobin: 15.9 g/dL (ref 12.0–16.0)
Lymphocytes %: 35 % (ref 21–52)
Lymphocytes Absolute: 3.4 10*3/uL (ref 0.9–3.6)
MCH: 32.8 PG (ref 24.0–34.0)
MCHC: 35.1 g/dL (ref 31.0–37.0)
MCV: 93.4 FL (ref 74.0–97.0)
MPV: 10.1 FL (ref 9.2–11.8)
Monocytes %: 7 % (ref 3–10)
Monocytes Absolute: 0.7 10*3/uL (ref 0.05–1.2)
Neutrophils %: 56 % (ref 40–73)
Neutrophils Absolute: 5.3 10*3/uL (ref 1.8–8.0)
Platelets: 239 10*3/uL (ref 135–420)
RBC: 4.85 M/uL (ref 4.20–5.30)
RDW: 12.6 % (ref 11.6–14.5)
WBC: 9.7 10*3/uL (ref 4.6–13.2)

## 2019-03-02 LAB — COMPREHENSIVE METABOLIC PANEL
ALT: 29 U/L (ref 13–56)
AST: 27 U/L (ref 10–38)
Albumin/Globulin Ratio: 0.8 (ref 0.8–1.7)
Albumin: 3.9 g/dL (ref 3.4–5.0)
Alkaline Phosphatase: 125 U/L — ABNORMAL HIGH (ref 45–117)
Anion Gap: 2 mmol/L — ABNORMAL LOW (ref 3.0–18)
BUN: 6 MG/DL — ABNORMAL LOW (ref 7.0–18)
Bun/Cre Ratio: 12 (ref 12–20)
CO2: 30 mmol/L (ref 21–32)
Calcium: 9.9 MG/DL (ref 8.5–10.1)
Chloride: 107 mmol/L (ref 100–111)
Creatinine: 0.52 MG/DL — ABNORMAL LOW (ref 0.6–1.3)
EGFR IF NonAfrican American: >60 mL/min/{1.73_m2} (ref >60)
GFR African American: >60 mL/min/{1.73_m2} (ref >60)
Globulin: 4.6 g/dL — ABNORMAL HIGH (ref 2.0–4.0)
Glucose: 119 mg/dL — ABNORMAL HIGH (ref 74–99)
Potassium: 3.9 mmol/L (ref 3.5–5.5)
Sodium: 139 mmol/L (ref 136–145)
Total Bilirubin: 0.4 MG/DL (ref 0.2–1.0)
Total Protein: 8.5 g/dL — ABNORMAL HIGH (ref 6.4–8.2)

## 2019-03-02 LAB — PROTIME-INR
INR: 1.1 (ref 0.8–1.2)
Protime: 13.8 s (ref 11.5–15.2)

## 2019-03-02 MED ORDER — SODIUM CHLORIDE 0.9 % IV
Freq: Once | INTRAVENOUS | Status: AC
Start: 2019-03-02 — End: 2019-03-02
  Administered 2019-03-02: 23:00:00 via INTRAVENOUS

## 2019-03-02 MED ORDER — NALOXONE 0.4 MG/ML INJECTION
0.4 mg/mL | INTRAMUSCULAR | Status: DC | PRN
Start: 2019-03-02 — End: 2019-03-03

## 2019-03-02 MED ORDER — MORPHINE 2 MG/ML INJECTION
2 mg/mL | Freq: Once | INTRAMUSCULAR | Status: AC
Start: 2019-03-02 — End: 2019-03-02
  Administered 2019-03-02: 22:00:00 via INTRAVENOUS

## 2019-03-02 MED ORDER — ONDANSETRON (PF) 4 MG/2 ML INJECTION
4 mg/2 mL | INTRAMUSCULAR | Status: AC
Start: 2019-03-02 — End: 2019-03-02
  Administered 2019-03-02: 22:00:00 via INTRAVENOUS

## 2019-03-02 MED ORDER — IOPAMIDOL 61 % IV SOLN
61 % | Freq: Once | INTRAVENOUS | Status: AC
Start: 2019-03-02 — End: 2019-03-02
  Administered 2019-03-02: 23:00:00 via INTRAVENOUS

## 2019-03-02 MED ORDER — SODIUM CHLORIDE 0.9% BOLUS IV
0.9 % | Freq: Once | INTRAVENOUS | Status: AC
Start: 2019-03-02 — End: 2019-03-02
  Administered 2019-03-02: 22:00:00 via INTRAVENOUS

## 2019-03-02 MED FILL — SODIUM CHLORIDE 0.9 % IV: INTRAVENOUS | Qty: 1000

## 2019-03-02 MED FILL — ISOVUE-300  61 % INTRAVENOUS SOLUTION: 300 mg iodine /mL (61 %) | INTRAVENOUS | Qty: 100

## 2019-03-02 MED FILL — SODIUM CHLORIDE 0.9 % IV: INTRAVENOUS | Qty: 100

## 2019-03-02 MED FILL — MORPHINE 2 MG/ML INJECTION: 2 mg/mL | INTRAMUSCULAR | Qty: 2

## 2019-03-02 MED FILL — ONDANSETRON (PF) 4 MG/2 ML INJECTION: 4 mg/2 mL | INTRAMUSCULAR | Qty: 2

## 2019-03-02 NOTE — ED Notes (Signed)
I have reviewed discharge instructions with the patient.  The patient verbalized understanding.  Dc home in stable condition.  Given rx and follow up info.  Pt states understanding.
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
## 2019-03-02 NOTE — ED Notes (Signed)
Assumed care of pt at this time.  awaiting radiology results.  No issues at this time.

## 2019-03-02 NOTE — ED Provider Notes (Signed)
[facility]  EMERGENCY DEPARTMENT HISTORY AND PHYSICAL EXAM       Date: 03/02/2019   Patient Name: Barbara [name]   Date of Birth: 09-27-1966  Medical Record Number: [id number]    HISTORY OF PRESENTING ILLNESS:     Barbara [name] is a 53 y.o. female presenting with the noted PMH to the ED c/o left-sided neck pain.  Patient states that she is got pain on left side of her neck.  She states started couple days ago.  Constant.  No increasing or decreasing factors.  She states she called her primary doctor and her doctor told her that she had a blood clot before.  She should come to the emergency room.  She denies falling or injury.  She states is in the left side of her neck rating into her left arm area.  Denies any chest pain or shortness of breath.  Denies abdominal pain.  Denies any nasal congestion or sore throat.  She reports a cough for the last 4 to 5 months.  She states she does smoke cigarettes.  Denies any abdominal pain.  Denies any fevers or chills.  Denies any urine or bowel changes.  Rest of 10 systems reviewed and negative.    Primary Care Provider: Toniann Ket, NP   Specialist:    Past Medical History:   Past Medical History:   Diagnosis Date   ??? Back pain    ??? Breast cancer ([place]) 2008 or 2009   ??? Chronic pain    ??? Diabetes ([place])    ??? FH: chemotherapy    ??? Hypertension    ??? Hypertensive retinopathy 2020   ??? Nausea & vomiting    ??? Neuropathy    ??? Radiation therapy complication 4098    NOT complication, just radiation        Past Surgical History:   Past Surgical History:   Procedure Laterality Date   ??? BIOPSY BREAST     ??? BIOPSY FINE NEEDLE     ??? HX BREAST AUGMENTATION Right 2010   ??? HX BREAST AUGMENTATION Left 09/17/2017    left breast reconstruction with exchange of tissue expander per breast prostesis and left breast tissue expander performed by [name], MD at [facility]   ??? HX BREAST AUGMENTATION Right 04/01/2018    removal of tissue expanders with placment of  right permanent breast prostesis performed by [name], MD at [facility]   ??? HX BREAST RECONSTRUCTION Right 2011    per pt - attempted latissimus dorsi flap, then saline implant   ??? HX BREAST RECONSTRUCTION Bilateral 06/10/2017    right breast prosthesis and capsule removal and bilateral breast tissue  expander reconstruction.  performed by [name], MD at [place]   ??? HX BREAST RECONSTRUCTION Left 07/09/2017    left breast  EXCISION and closure of wound performed by [name], MD at [facility]   ??? HX BREAST RECONSTRUCTION Bilateral     Tissue expanders, to prepare for implants    ??? HX CESAREAN SECTION     ??? HX LUMBAR DISKECTOMY      herniated disc   ??? HX MASTECTOMY Right 2010    Total mastectomy per pt.     ??? HX MASTECTOMY Left 06/10/2017    left mastectomy,Sentinal node biopsies. performed by [name], MD at [facility] MAIN OR   ??? HX MASTECTOMY Bilateral    ??? HX MASTOPEXY (BREAST LIFT)  Left 2011   ??? IMPLANT BREAST SILICONE/EQ Right 3244    post mastectomy   ??? THUMB SUPPORT      thumb surgery to mobilize thumb        Social History:   Social History     Tobacco Use   ??? Smoking status: Current Some Day Smoker     Packs/day: 0.50     Years: 15.00     Pack years: 7.50     Types: Cigarettes   ??? Smokeless tobacco: Never Used   ??? Tobacco comment: 5-6 cigs per day patient education given   Substance Use Topics   ??? Alcohol use: No   ??? Drug use: No        Allergies:   Allergies   Allergen Reactions   ??? Tramadol Anaphylaxis   ??? Metformin Diarrhea   ??? Trulicity [Dulaglutide] Nausea Only        REVIEW OF SYSTEMS:  Review of Systems      PHYSICAL EXAM:  Vitals:    03/02/19 1658 03/02/19 1714 03/02/19 1718 03/02/19 1901   BP: (!) 213/119  159/89 137/89   Pulse: 93      Resp: 20   18   Temp: 98.6 ??F (37 ??C)   97.6 ??F (36.4 ??C)   SpO2: 100%   99%   Weight:  61.2 kg (135 lb)     Height:  5' 2"  (1.575 m)         Physical Exam   Vital signs reviewed.  Alert oriented x 3 in NAD.  HEENT: normocephalic  atraumatic.    Eyes are PERRLA EOMI.  Conjunctiva normal.    External ears and nose normal.    Neck: normal external exam. No midline neck or back TTP.    Mild TTP along left trapezius.  No overlying skin changes.  No step-offs or crepitus.  Lungs are clear to ascultation bilaterally.  normal effort  Heart is regular rate and rhythm with no murmurs.    Abdomen soft and nontender.  No rebound rigidity or guarding.    Extremities: Moves all 4 extremities and no distress.  Full range of motion.  2+ pulses and BCR in all 4 extremities.  Neuro: Normal gait.  5 out of 5 strength in all 4 extremities. No facial droop.   Skin examination: intact. no rashes. No petechia or purpura.      Medications   naloxone (NARCAN) injection 0.4 mg (has no administration in time range)   sodium chloride 0.9 % bolus infusion 1,000 mL (0 mL IntraVENous IV Completed 03/02/19 2138)   morphine injection 4 mg (4 mg IntraVENous Given 03/02/19 1829)   ondansetron (ZOFRAN) injection 4 mg (4 mg IntraVENous Given 03/02/19 1829)   iopamidoL (ISOVUE 300) 61 % contrast injection 100 mL (80 mL IntraVENous Given 03/02/19 1913)   0.9% sodium chloride infusion 100 mL (0 mL IntraVENous IV Completed 03/02/19 2138)   oxyCODONE-acetaminophen (PERCOCET) 5-325 mg per tablet 1 Tab (1 Tab Oral Given 03/02/19 2136)       RESULTS:    Labs -   Labs Reviewed   CBC WITH AUTOMATED DIFF - Abnormal; Notable for the following components:       Result Value    HCT 45.3 (*)     All other components within normal limits   METABOLIC PANEL, COMPREHENSIVE - Abnormal; Notable for the following components:    Anion gap 2 (*)     Glucose 119 (*)     BUN  6 (*)     Creatinine 0.52 (*)     Alk. phosphatase 125 (*)     Protein, total 8.5 (*)     Globulin 4.6 (*)     All other components within normal limits   PROTHROMBIN TIME + INR       Radiologic Studies -  No results found.      MEDICAL DECISION MAKING    1935.  Patient reassessed.  States is gone for CT.  Awaiting results.  2041.   Patient reassessed.  Still awaiting CT.  2129.  Patient reassessed.  States pain is coming back.  Results and precautions explained.  Patient states understanding and agrees with plan.  Patient states that she seen orthopedics before for her lower spine.  Had to have surgery done then about 6 years ago.  Talk to patient about following back up with spine to be rechecked regarding her neck.  As she may need additional testing and procedures done.  Patient states understanding and agrees with plan.  CTA showing chronic changes.  Nothing acute.  Patient states she follows up with her primary doctor regarding those.  No evidence of any strokelike symptoms.  No fevers or meningitis.  No chest pain ACS or STEMI.  No pneumonia or pneumothorax.  Results and precaution explained.  Patient states understanding and agrees with plan.  Seems more like torticollis versus osteoarthritis with radiculopathy.  No evidence of cauda equina or transverse myelitis.  Results and precaution explained.  Patient states understanding and agrees with plan.    Patient has no new complaints, changes, or physical findings.   Results were reviewed with the patient.  Pt's questions and concerns were addressed. Care plan was outlined, including follow-up with PCP/specialist and return precautions were discussed. Patient is felt to be stable for discharge at this time.       Diagnosis   Clinical Impression:   1. 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    Follow-up Information     Follow up With Specialties Details Why Contact Info    Toniann Ket, NP Nurse Practitioner Call in 1 day  [address]  [place] [address]  [phone]      [facility]  Call in 1 day  [address]  [place]  [phone]    [facility] EMERGENCY DEPT Emergency Medicine Go in 2 days If symptoms worsen,  As needed [place]  [phone]          Current Discharge Medication List      START taking these medications    Details   oxyCODONE-acetaminophen (Percocet) 5-325 mg per tablet Take 1 Tab by mouth every six (6) hours as needed for Pain for up to 6 doses. Max Daily Amount: 4 Tabs.  Qty: 6 Tab, Refills: 0    Associated Diagnoses: Osteoarthritis of spine with radiculopathy, cervical region             Discharged in stable and improved condition.    This chart was completed using Dragon, a dictation transcription service. Errors may have resulted from using this device.

## 2019-03-02 NOTE — ED Notes (Signed)
Pt arrives with chronic neck pain. Pt thinks she may have a blood clot in her neck.     2121  Waiting on test results.Vices no complaints
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
## 2019-03-02 NOTE — ED Provider Notes (Addendum)
[facility]  EMERGENCY DEPARTMENT HISTORY AND PHYSICAL EXAM       Date: 03/02/2019   Patient Name: Barbara [name]   Date of Birth: December 30, 1965  Medical Record Number: [id number]    HISTORY OF PRESENTING ILLNESS:     Audreena Sachdeva is a 53 y.o. female presenting with the noted PMH to the ED c/o left-sided neck pain.  Patient states that she is got pain on left side of her neck.  She states started couple days ago.  Constant.  No increasing or decreasing factors.  She states she called her primary doctor and her doctor told her that she had a blood clot before.  She should come to the emergency room.  She denies falling or injury.  She states is in the left side of her neck rating into her left arm area.  Denies any chest pain or shortness of breath.  Denies abdominal pain.  Denies any nasal congestion or sore throat.  She reports a cough for the last 4 to 5 months.  She states she does smoke cigarettes.  Denies any abdominal pain.  Denies any fevers or chills.  Denies any urine or bowel changes.  Rest of 10 systems reviewed and negative.    Primary Care Provider: Toniann Ket, NP   Specialist:    Past Medical History:   Past Medical History:   Diagnosis Date   ??? Back pain    ??? Breast cancer ([place]) 2008 or 2009   ??? Chronic pain    ??? Diabetes ([place])    ??? FH: chemotherapy    ??? Hypertension    ??? Hypertensive retinopathy 2020   ??? Nausea & vomiting    ??? Neuropathy    ??? Radiation therapy complication 9518    NOT complication, just radiation        Past Surgical History:   Past Surgical History:   Procedure Laterality Date   ??? BIOPSY BREAST     ??? BIOPSY FINE NEEDLE     ??? HX BREAST AUGMENTATION Right 2010   ??? HX BREAST AUGMENTATION Left 09/17/2017    left breast reconstruction with exchange of tissue expander per breast prostesis and left breast tissue expander performed by [name], MD at [place]   ??? HX BREAST AUGMENTATION Right 04/01/2018     removal of tissue expanders with placment of right permanent breast prostesis performed by [name], MD at [place]   ??? HX BREAST RECONSTRUCTION Right 2011    per pt - attempted latissimus dorsi flap, then saline implant   ??? HX BREAST RECONSTRUCTION Bilateral 06/10/2017    right breast prosthesis and capsule removal and bilateral breast tissue  expander reconstruction.  performed by [name], MD at [place]   ??? HX BREAST RECONSTRUCTION Left 07/09/2017    left breast  EXCISION and closure of wound performed by [name], MD at [place]   ??? HX BREAST RECONSTRUCTION Bilateral     Tissue expanders, to prepare for implants    ??? HX CESAREAN SECTION     ??? HX LUMBAR DISKECTOMY      herniated disc   ??? HX MASTECTOMY Right 2010    Total mastectomy per pt.     ??? HX MASTECTOMY Left 06/10/2017    left mastectomy,Sentinal node biopsies. performed by [name], MD at [facility] MAIN OR   ??? HX MASTECTOMY Bilateral    ??? HX MASTOPEXY (BREAST LIFT)  Left 2011   ??? IMPLANT BREAST SILICONE/EQ Right 8756    post mastectomy   ??? THUMB SUPPORT      thumb surgery to mobilize thumb        Social History:   Social History     Tobacco Use   ??? Smoking status: Current Some Day Smoker     Packs/day: 0.50     Years: 15.00     Pack years: 7.50     Types: Cigarettes   ??? Smokeless tobacco: Never Used   ??? Tobacco comment: 5-6 cigs per day patient education given   Substance Use Topics   ??? Alcohol use: No   ??? Drug use: No        Allergies:   Allergies   Allergen Reactions   ??? Tramadol Anaphylaxis   ??? Metformin Diarrhea   ??? Trulicity [Dulaglutide] Nausea Only        REVIEW OF SYSTEMS:  Review of Systems      PHYSICAL EXAM:  Vitals:    03/02/19 1658 03/02/19 1714 03/02/19 1718 03/02/19 1901   BP: (!) 213/119  159/89 137/89   Pulse: 93      Resp: 20   18   Temp: 98.6 ??F (37 ??C)   97.6 ??F (36.4 ??C)   SpO2: 100%   99%   Weight:  61.2 kg (135 lb)     Height:  5' 2"  (1.575 m)         Physical Exam   Vital signs reviewed.   Alert oriented x 3 in NAD.  HEENT: normocephalic atraumatic.    Eyes are PERRLA EOMI.  Conjunctiva normal.    External ears and nose normal.    Neck: normal external exam. No midline neck or back TTP.    Mild TTP along left trapezius.  No overlying skin changes.  No step-offs or crepitus.  Lungs are clear to ascultation bilaterally.  normal effort  Heart is regular rate and rhythm with no murmurs.    Abdomen soft and nontender.  No rebound rigidity or guarding.    Extremities: Moves all 4 extremities and no distress.  Full range of motion.  2+ pulses and BCR in all 4 extremities.  Neuro: Normal gait.  5 out of 5 strength in all 4 extremities. No facial droop.   Skin examination: intact. no rashes. No petechia or purpura.      Medications   naloxone (NARCAN) injection 0.4 mg (has no administration in time range)   sodium chloride 0.9 % bolus infusion 1,000 mL (0 mL IntraVENous IV Completed 03/02/19 2138)   morphine injection 4 mg (4 mg IntraVENous Given 03/02/19 1829)   ondansetron (ZOFRAN) injection 4 mg (4 mg IntraVENous Given 03/02/19 1829)   iopamidoL (ISOVUE 300) 61 % contrast injection 100 mL (80 mL IntraVENous Given 03/02/19 1913)   0.9% sodium chloride infusion 100 mL (0 mL IntraVENous IV Completed 03/02/19 2138)   oxyCODONE-acetaminophen (PERCOCET) 5-325 mg per tablet 1 Tab (1 Tab Oral Given 03/02/19 2136)       RESULTS:    Labs -   Labs Reviewed   CBC WITH AUTOMATED DIFF - Abnormal; Notable for the following components:       Result Value    HCT 45.3 (*)     All other components within normal limits   METABOLIC PANEL, COMPREHENSIVE - Abnormal; Notable for the following components:    Anion gap 2 (*)     Glucose 119 (*)     BUN  6 (*)     Creatinine 0.52 (*)     Alk. phosphatase 125 (*)     Protein, total 8.5 (*)     Globulin 4.6 (*)     All other components within normal limits   PROTHROMBIN TIME + INR       Radiologic Studies -  No results found.      MEDICAL DECISION MAKING     1935.  Patient reassessed.  States is gone for CT.  Awaiting results.  2041.  Patient reassessed.  Still awaiting CT.  2129.  Patient reassessed.  States pain is coming back.  Results and precautions explained.  Patient states understanding and agrees with plan.  Patient states that she seen orthopedics before for her lower spine.  Had to have surgery done then about 6 years ago.  Talk to patient about following back up with spine to be rechecked regarding her neck.  As she may need additional testing and procedures done.  Patient states understanding and agrees with plan.  CTA showing chronic changes.  Nothing acute.  Patient states she follows up with her primary doctor regarding those.  No evidence of any strokelike symptoms.  No fevers or meningitis.  No chest pain ACS or STEMI.  No pneumonia or pneumothorax.  Results and precaution explained.  Patient states understanding and agrees with plan.  Seems more like torticollis versus osteoarthritis with radiculopathy.  No evidence of cauda equina or transverse myelitis.  Results and precaution explained.  Patient states understanding and agrees with plan.    Patient has no new complaints, changes, or physical findings.   Results were reviewed with the patient.  Pt's questions and concerns were addressed. Care plan was outlined, including follow-up with PCP/specialist and return precautions were discussed. Patient is felt to be stable for discharge at this time.       Diagnosis   Clinical Impression:   1. 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    Follow-up Information     Follow up With Specialties Details Why Contact Info    Toniann Ket, NP Nurse Practitioner Call in 1 day  [address]  [place] [address]  [phone]      [facility]  Call in 1 day  [address]  [place]   [phone]    [facility] EMERGENCY DEPT Emergency Medicine Go in 2 days If symptoms worsen, As needed [place]  [phone]          Current Discharge Medication List      START taking these medications    Details   oxyCODONE-acetaminophen (Percocet) 5-325 mg per tablet Take 1 Tab by mouth every six (6) hours as needed for Pain for up to 6 doses. Max Daily Amount: 4 Tabs.  Qty: 6 Tab, Refills: 0    Associated Diagnoses: Osteoarthritis of spine with radiculopathy, cervical region             Discharged in stable and improved condition.    This chart was completed using Dragon, a dictation transcription service. Errors may have resulted from using this device.

## 2019-03-02 NOTE — ED Triage Notes (Addendum)
Pt arrives with chronic neck pain. Pt thinks she may have a blood clot in her neck.     2121  Waiting on test results.Vices no complaints

## 2019-03-03 MED ORDER — OXYCODONE-ACETAMINOPHEN 5 MG-325 MG TAB
5-325 mg | Freq: Once | ORAL | Status: AC
Start: 2019-03-03 — End: 2019-03-02
  Administered 2019-03-03: 02:00:00 via ORAL

## 2019-03-03 MED ORDER — OXYCODONE-ACETAMINOPHEN 5 MG-325 MG TAB
5-325 mg | ORAL_TABLET | Freq: Four times a day (QID) | ORAL | 0 refills | Status: AC | PRN
Start: 2019-03-03 — End: 2019-03-04

## 2019-03-03 MED FILL — OXYCODONE-ACETAMINOPHEN 5 MG-325 MG TAB: 5-325 mg | ORAL | Qty: 1

## 2019-03-03 NOTE — Telephone Encounter (Signed)
Pt. Is asking if she still need to get MRI and US done since she went to ER last night and they did a CT scan. Please advise.

## 2019-03-03 NOTE — Telephone Encounter (Signed)
She will not need the carotid US, but will still need the cervical MRI.

## 2019-03-04 NOTE — Telephone Encounter (Signed)
Pt. Was advised the [place] is not needed, but MRI is needed. Pt. Understood.

## 2019-03-05 NOTE — Telephone Encounter (Signed)
Pt. Would like to speak with Lelan Pons regarding the hip and knee doctor (Dr. Noberto Retort), that she went to see today. She stated the ER referred her to them and the doctor did not know why she was referred to him because she has neck issues. Pt. Stated Dr. Noberto Retort referred her to the back doctor in the same office. Please assist.

## 2019-03-09 NOTE — Telephone Encounter (Signed)
Could you please find out more detail about her question for me or what she needs?

## 2019-03-09 NOTE — Telephone Encounter (Signed)
Lvm to return call.

## 2019-03-10 NOTE — Telephone Encounter (Signed)
Set up VV?

## 2019-03-10 NOTE — Telephone Encounter (Signed)
Pt. Stated she doesn't know what is wrong with her. She doesn't know if she has inflammation or a vascular problem. She was told she has two different diagnoses. She stated Dr. Noberto Retort is a "knee and hip" doctor and not a "back and knee doctor". She would like to speak with Lelan Pons regarding her diagnosis.

## 2019-03-10 NOTE — Telephone Encounter (Signed)
Yes, VV please

## 2019-03-11 NOTE — Telephone Encounter (Signed)
Patient set up for VV tomorrow at 1:30 using Doxy.

## 2019-03-12 ENCOUNTER — Telehealth: Attending: Nurse Practitioner | Primary: Nurse Practitioner

## 2019-03-12 ENCOUNTER — Encounter: Attending: Nurse Practitioner | Primary: Nurse Practitioner

## 2019-03-12 ENCOUNTER — Telehealth: Admit: 2019-03-12 | Payer: PRIVATE HEALTH INSURANCE | Attending: Nurse Practitioner | Primary: Nurse Practitioner

## 2019-03-12 ENCOUNTER — Encounter

## 2019-03-12 DIAGNOSIS — R9389 Abnormal findings on diagnostic imaging of other specified body structures: Secondary | ICD-10-CM

## 2019-03-12 MED ORDER — RAMIPRIL 10 MG CAP
10 mg | ORAL_CAPSULE | ORAL | 1 refills | Status: DC
Start: 2019-03-12 — End: 2019-08-09

## 2019-03-12 MED ORDER — OXYCODONE-ACETAMINOPHEN 5 MG-325 MG TAB
5-325 mg | ORAL_TABLET | Freq: Three times a day (TID) | ORAL | 0 refills | Status: DC | PRN
Start: 2019-03-12 — End: 2019-04-27

## 2019-03-12 NOTE — Progress Notes (Signed)
Barbara [name] was seen by synchronous (real-time) audio-video technology DOXY on 03/12/2019.      Consent: Barbara [name], who was seen by synchronous (real-time) audio-video technology, and/or her healthcare decision maker, is aware that this patient-initiated, Telehealth encounter on 03/12/2019 is a billable service, with coverage as determined by her insurance carrier. She is aware that she may receive a bill and has provided verbal consent to proceed: yes  712  Subjective:     HPI:  Barbara [name] is a 53 y.o. female who was seen for the following:   ER follow up.     She reports to follow-up with acute on chronic neck pain.  The medications help to take the edge off but continues with significant pain.    She recently went to an ER for the neck pain.  CTA of the neck showed:    IMPRESSION:  ??  1.  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  The right vertebral artery is very diminutive in size and the  intracranial segment is poorly visualized, predominantly ending in the posterior  inferior cerebellar artery which may be stenotic or or partially occluded.  ??  2.  No evidence of hemodynamically significant stenosis or occlusion involving  the main cervical vessels.    Will be seeing spine specialist on 03/17/19 for her cervical spine.  Also has cervical spine MRI ordered.          Review of Systems   Neck pain per HPI.  Otherwise feeling okay.    Past Medical History, Past Surgery History, Allergies, Social History, and Family History were reviewed and updated.      Patient Active Problem List   Diagnosis Code   ??? Essential hypertension I10   ??? 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 Pain of left hip joint M25.552   ??? Low back pain M54.5   ??? Smoker F17.200   ??? Dupuytren contracture M72.0   ??? S/P mastectomy, left Z90.12   ??? Cigarette nicotine dependence without complication Q96.438   ??? Recurrent depression (HCC) F33.9   ??? History of breast  cancer Z85.3   ??? Hypertensive retinopathy H35.039     Past Medical History:   Diagnosis Date   ??? Atherosclerosis 2020   ??? Back pain    ??? Breast cancer ([place]) 2008 or 2009   ??? Chronic pain    ??? Diabetes ([place])    ??? FH: chemotherapy    ??? Hypertension    ??? Hypertensive retinopathy 2020   ??? Nausea & vomiting    ??? Neuropathy    ??? Radiation therapy complication 3818    NOT complication, just radiation     Patient Care Team:  Toniann Ket, NP as PCP - General (Nurse Practitioner)  Toniann Ket, NP as PCP - [facility] Empaneled Provider  Anastasio Auerbach., RN as Nurse Navigator (Oncology)  Reita Cliche, MD (Breast Surgery)  Lilian Coma, MD as Physician (Physical Medicine and Rehab)    Past Surgical History:   Procedure Laterality Date   ??? BIOPSY BREAST     ??? BIOPSY FINE NEEDLE     ??? HX BREAST AUGMENTATION Right 2010   ??? HX BREAST AUGMENTATION Left 09/17/2017    left breast reconstruction with exchange of tissue expander per breast prostesis and left breast tissue expander performed by [name], MD at [place]   ??? HX BREAST AUGMENTATION  Right 04/01/2018    removal of tissue expanders with placment of right permanent breast prostesis performed by [name], MD at [place]   ??? HX BREAST RECONSTRUCTION Right 2011    per pt - attempted latissimus dorsi flap, then saline implant   ??? HX BREAST RECONSTRUCTION Bilateral 06/10/2017    right breast prosthesis and capsule removal and bilateral breast tissue  expander reconstruction.  performed by [name], MD at [place]   ??? HX BREAST RECONSTRUCTION Left 07/09/2017    left breast  EXCISION and closure of wound performed by [name], MD at [place]   ??? HX BREAST RECONSTRUCTION Bilateral     Tissue expanders, to prepare for implants    ??? HX CESAREAN SECTION     ??? HX LUMBAR DISKECTOMY      herniated disc   ??? HX MASTECTOMY Right 2010    Total mastectomy per pt.     ??? HX MASTECTOMY Left 06/10/2017    left mastectomy,Sentinal  node biopsies. performed by [name], MD at [facility] MAIN OR   ??? HX MASTECTOMY Bilateral    ??? HX MASTOPEXY (BREAST LIFT) Left 2011   ??? IMPLANT BREAST SILICONE/EQ Right 7544    post mastectomy   ??? THUMB SUPPORT      thumb surgery to mobilize thumb     Family History   Problem Relation Age of Onset   ??? Diabetes Mother    ??? No Known Problems Father      Social History     Tobacco Use   ??? Smoking status: Current Some Day Smoker     Packs/day: 0.50     Years: 15.00     Pack years: 7.50     Types: Cigarettes   ??? Smokeless tobacco: Never Used   ??? Tobacco comment: 5-6 cigs per day patient education given   Substance Use Topics   ??? Alcohol use: No   ??? Drug use: No     Allergies   Allergen Reactions   ??? Tramadol Anaphylaxis   ??? Metformin Diarrhea   ??? Trulicity [Dulaglutide] Nausea Only     Current Outpatient Medications on File Prior to Visit   Medication Sig Dispense Refill   ??? cholecalciferol (VITAMIN D3) (2,000 UNITS /50 MCG) cap capsule Take 2,000 Units by mouth daily. 90 Cap 3   ??? flash glucose scanning reader (FreeStyle Libre 14 Day Reader) misc Used as directed to check blood sugar 4 times a day 1 Each 0   ??? flash glucose sensor (FreeStyle Libre 14 Day Sensor) kit Use as directed to check blood sugar 4 times a day 1 Kit 5   ??? glucose blood VI test strips (FreeStyle Lite Strips) strip Use as directed to check blood sugar 4 times a day 100 Strip 1   ??? ALPRAZolam (Xanax) 0.5 mg tablet Take 0.5 Tabs by mouth two (2) times daily as needed for Anxiety. Max Daily Amount: 0.5 mg. 30 Tab 2   ??? tiZANidine (ZANAFLEX) 2 mg tablet Take 1 Tab by mouth three (3) times daily as needed for Muscle Spasm(s). 30 Tab 2   ??? diclofenac EC (VOLTAREN) 75 mg EC tablet Take 1 Tab by mouth two (2) times a day. 30 Tab 3   ??? venlafaxine-SR (EFFEXOR-XR) 75 mg capsule Take 3 Caps by mouth daily. 90 Cap 3   ??? albuterol (PROVENTIL HFA, VENTOLIN HFA, PROAIR HFA) 90 mcg/actuation inhaler Take 2 Puffs by inhalation every six (6)  hours as needed for  Wheezing. 1 Inhaler 3   ??? insulin NPH (NOVOLIN N, HUMULIN N) 100 unit/mL injection 40 Units in the AM, 35 Units in the PM (Patient taking differently: 45 Units in the AM, 40 Units in the PM) 1 Vial 5   ??? ibuprofen (MOTRIN) 600 mg tablet Take 1 Tab by mouth every six (6) hours as needed for Pain. 20 Tab 0   ??? gabapentin (NEURONTIN) 400 mg capsule 800 mg PO TID 270 Cap 3   ??? atorvastatin (LIPITOR) 10 mg tablet Take 1 Tab by mouth daily. 90 Tab 1   ??? nystatin (MYCOSTATIN) powder Apply to rash 4 times a day 60 g 3   ??? ketoconazole (NIZORAL) 2 % topical cream Apply  to affected area two (2) times a day. 60 g 3   ??? [DISCONTINUED] ramipril (ALTACE) 10 mg capsule Take 1 Cap by mouth daily. 90 Cap 3   ??? letrozole (FEMARA) 2.5 mg tablet Take 2.5 mg by mouth daily.       No current facility-administered medications on file prior to visit.      Health Maintenance Due   Topic Date Due   ??? Colonoscopy  11/27/1985   ??? PAP AKA CERVICAL CYTOLOGY  11/27/1988   ??? Shingrix Vaccine Age 60> (1 of 2) 11/27/2017   ??? A1C test (Diabetic or Prediabetic)  02/03/2019         Objective     PHYSICAL EXAMINATION:    Vital Signs: (As obtained by patient/caregiver at home) There were no vitals taken for this visit.      General: Alert, cooperative, no distress   Mental  status: Normal mood, behavior, speech, dress, motor activity, and thought processes, able to follow commands   HENT: Normocephalic, atraumatic   Neck: No visualized mass   Resp: No respiratory distress   Neuro: No gross deficits   Skin: No discoloration or lesions of concern on visible areas   Psychiatric: Normal affect, consistent with stated mood, no evidence of hallucinations     Additional exam findings: none      LABS     TESTS      Assessment and Plan     1. Abnormal finding on CT scan  We will obtain an MRA of the brain to further evaluate abnormalities noted on CTA of the neck.  - MRA BRAIN W CONT; Future    2. Chronic neck pain with history of cervical spinal  surgery  Follow-up with Ortho spine specialist as planned.  Continue previous medications.  Add Percocet as needed for breakthrough pain.  - oxyCODONE-acetaminophen (PERCOCET) 5-325 mg per tablet; Take 1 Tab by mouth every eight (8) hours as needed for Pain for up to 7 days. Max Daily Amount: 3 Tabs.  Dispense: 10 Tab; Refill: 0      Follow-up and Dispositions    ?? Return in about 4 weeks (around 04/09/2019) for follow up on MRI/MRA results.             712  We discussed the expected course, resolution and complications of the diagnosis(es) in detail.  Medication risks, benefits, costs, interactions, and alternatives were discussed as indicated.  I advised her to contact the office if her condition worsens, changes or fails to improve as anticipated. She expressed understanding with the diagnosis(es) and plan.     Barbara [name] is a 53 y.o. female who was evaluated by a video visit encounter for concerns as above. Patient identification was  verified prior to start of the visit. A caregiver was present when appropriate. 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  Pursuant to the emergency declaration under the [place], 1135 waiver authority and the [employer] Act, this Virtual  Visit was conducted, with patient's (and/or legal guardian's) consent, to reduce the patient's risk of exposure to COVID-19 and provide necessary medical care.     Services were provided through a video synchronous discussion virtually to substitute for in-person clinic visit.   Patient was located at home and provider was located at the office.      Signed the office electronically by Lawrence Marseilles, DNP, FNP-BC

## 2019-03-12 NOTE — Progress Notes (Signed)
Barbara [name] was seen by synchronous (real-time) audio-video technology DOXY on 03/12/2019.      Consent: Barbara [name], who was seen by synchronous (real-time) audio-video technology, and/or her healthcare decision maker, is aware that this patient-initiated, Telehealth encounter on 03/12/2019 is a billable service, with coverage as determined by her insurance carrier. She is aware that she may receive a bill and has provided verbal consent to proceed: yes  712  Subjective:     HPI:  Barbara [name] is a 53 y.o. female who was seen for the following:   ER follow up.     She reports to follow-up with acute on chronic neck pain.  The medications help to take the edge off but continues with significant pain.    She recently went to an ER for the neck pain.  CTA of the neck showed:    IMPRESSION:  ??  1.  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  The right vertebral artery is very diminutive in size and the  intracranial segment is poorly visualized, predominantly ending in the posterior  inferior cerebellar artery which may be stenotic or or partially occluded.  ??  2.  No evidence of hemodynamically significant stenosis or occlusion involving  the main cervical vessels.    Will be seeing spine specialist on 03/17/19 for her cervical spine.  Also has cervical spine MRI ordered.          Review of Systems   Neck pain per HPI.  Otherwise feeling okay.    Past Medical History, Past Surgery History, Allergies, Social History, and Family History were reviewed and updated.      Patient Active Problem List   Diagnosis Code   ??? Essential hypertension I10   ??? 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 Pain of left hip joint M25.552   ??? Low back pain M54.5   ??? Smoker F17.200   ??? Dupuytren contracture M72.0   ??? S/P mastectomy, left Z90.12   ??? Cigarette nicotine dependence without complication U93.235   ??? Recurrent depression (HCC) F33.9    ??? History of breast cancer Z85.3   ??? Hypertensive retinopathy H35.039     Past Medical History:   Diagnosis Date   ??? Atherosclerosis 2020   ??? Back pain    ??? Breast cancer ([place]) 2008 or 2009   ??? Chronic pain    ??? Diabetes ([place])    ??? FH: chemotherapy    ??? Hypertension    ??? Hypertensive retinopathy 2020   ??? Nausea & vomiting    ??? Neuropathy    ??? Radiation therapy complication 5732    NOT complication, just radiation     Patient Care Team:  Toniann Ket, NP as PCP - General (Nurse Practitioner)  Toniann Ket, NP as PCP - [facility] Empaneled Provider  Anastasio Auerbach., RN as Nurse Navigator (Oncology)  Reita Cliche, MD (Breast Surgery)  Lilian Coma, MD as Physician (Physical Medicine and Rehab)    Past Surgical History:   Procedure Laterality Date   ??? BIOPSY BREAST     ??? BIOPSY FINE NEEDLE     ??? HX BREAST AUGMENTATION Right 2010   ??? HX BREAST AUGMENTATION Left 09/17/2017    left breast reconstruction with exchange of tissue expander per breast prostesis and left breast tissue expander performed by [name], MD at [place]   ??? HX BREAST AUGMENTATION  Right 04/01/2018    removal of tissue expanders with placment of right permanent breast prostesis performed by [name], MD at [place]   ??? HX BREAST RECONSTRUCTION Right 2011    per pt - attempted latissimus dorsi flap, then saline implant   ??? HX BREAST RECONSTRUCTION Bilateral 06/10/2017    right breast prosthesis and capsule removal and bilateral breast tissue  expander reconstruction.  performed by [name], MD at [place]   ??? HX BREAST RECONSTRUCTION Left 07/09/2017    left breast  EXCISION and closure of wound performed by [name], MD at [place]   ??? HX BREAST RECONSTRUCTION Bilateral     Tissue expanders, to prepare for implants    ??? HX CESAREAN SECTION     ??? HX LUMBAR DISKECTOMY      herniated disc   ??? HX MASTECTOMY Right 2010    Total mastectomy per pt.     ??? HX MASTECTOMY Left 06/10/2017     left mastectomy,Sentinal node biopsies. performed by [name], MD at [facility] MAIN OR   ??? HX MASTECTOMY Bilateral    ??? HX MASTOPEXY (BREAST LIFT) Left 2011   ??? IMPLANT BREAST SILICONE/EQ Right 7371    post mastectomy   ??? THUMB SUPPORT      thumb surgery to mobilize thumb     Family History   Problem Relation Age of Onset   ??? Diabetes Mother    ??? No Known Problems Father      Social History     Tobacco Use   ??? Smoking status: Current Some Day Smoker     Packs/day: 0.50     Years: 15.00     Pack years: 7.50     Types: Cigarettes   ??? Smokeless tobacco: Never Used   ??? Tobacco comment: 5-6 cigs per day patient education given   Substance Use Topics   ??? Alcohol use: No   ??? Drug use: No     Allergies   Allergen Reactions   ??? Tramadol Anaphylaxis   ??? Metformin Diarrhea   ??? Trulicity [Dulaglutide] Nausea Only     Current Outpatient Medications on File Prior to Visit   Medication Sig Dispense Refill   ??? cholecalciferol (VITAMIN D3) (2,000 UNITS /50 MCG) cap capsule Take 2,000 Units by mouth daily. 90 Cap 3   ??? flash glucose scanning reader (FreeStyle Libre 14 Day Reader) misc Used as directed to check blood sugar 4 times a day 1 Each 0   ??? flash glucose sensor (FreeStyle Libre 14 Day Sensor) kit Use as directed to check blood sugar 4 times a day 1 Kit 5   ??? glucose blood VI test strips (FreeStyle Lite Strips) strip Use as directed to check blood sugar 4 times a day 100 Strip 1   ??? ALPRAZolam (Xanax) 0.5 mg tablet Take 0.5 Tabs by mouth two (2) times daily as needed for Anxiety. Max Daily Amount: 0.5 mg. 30 Tab 2   ??? tiZANidine (ZANAFLEX) 2 mg tablet Take 1 Tab by mouth three (3) times daily as needed for Muscle Spasm(s). 30 Tab 2   ??? diclofenac EC (VOLTAREN) 75 mg EC tablet Take 1 Tab by mouth two (2) times a day. 30 Tab 3   ??? venlafaxine-SR (EFFEXOR-XR) 75 mg capsule Take 3 Caps by mouth daily. 90 Cap 3   ??? albuterol (PROVENTIL HFA, VENTOLIN HFA, PROAIR HFA) 90 mcg/actuation  inhaler Take 2 Puffs by inhalation every six (  6) hours as needed for Wheezing. 1 Inhaler 3   ??? insulin NPH (NOVOLIN N, HUMULIN N) 100 unit/mL injection 40 Units in the AM, 35 Units in the PM (Patient taking differently: 45 Units in the AM, 40 Units in the PM) 1 Vial 5   ??? ibuprofen (MOTRIN) 600 mg tablet Take 1 Tab by mouth every six (6) hours as needed for Pain. 20 Tab 0   ??? gabapentin (NEURONTIN) 400 mg capsule 800 mg PO TID 270 Cap 3   ??? atorvastatin (LIPITOR) 10 mg tablet Take 1 Tab by mouth daily. 90 Tab 1   ??? nystatin (MYCOSTATIN) powder Apply to rash 4 times a day 60 g 3   ??? ketoconazole (NIZORAL) 2 % topical cream Apply  to affected area two (2) times a day. 60 g 3   ??? [DISCONTINUED] ramipril (ALTACE) 10 mg capsule Take 1 Cap by mouth daily. 90 Cap 3   ??? letrozole (FEMARA) 2.5 mg tablet Take 2.5 mg by mouth daily.       No current facility-administered medications on file prior to visit.      Health Maintenance Due   Topic Date Due   ??? Colonoscopy  11/27/1985   ??? PAP AKA CERVICAL CYTOLOGY  11/27/1988   ??? Shingrix Vaccine Age 77> (1 of 2) 11/27/2017   ??? A1C test (Diabetic or Prediabetic)  02/03/2019         Objective     PHYSICAL EXAMINATION:    Vital Signs: (As obtained by patient/caregiver at home) There were no vitals taken for this visit.      General: Alert, cooperative, no distress   Mental  status: Normal mood, behavior, speech, dress, motor activity, and thought processes, able to follow commands   HENT: Normocephalic, atraumatic   Neck: No visualized mass   Resp: No respiratory distress   Neuro: No gross deficits   Skin: No discoloration or lesions of concern on visible areas   Psychiatric: Normal affect, consistent with stated mood, no evidence of hallucinations     Additional exam findings: none      LABS     TESTS      Assessment and Plan     1. Abnormal finding on CT scan  We will obtain an MRA of the brain to further evaluate abnormalities noted on CTA of the neck.  - MRA BRAIN W CONT; Future     2. Chronic neck pain with history of cervical spinal surgery  Follow-up with Ortho spine specialist as planned.  Continue previous medications.  Add Percocet as needed for breakthrough pain.  - oxyCODONE-acetaminophen (PERCOCET) 5-325 mg per tablet; Take 1 Tab by mouth every eight (8) hours as needed for Pain for up to 7 days. Max Daily Amount: 3 Tabs.  Dispense: 10 Tab; Refill: 0      Follow-up and Dispositions    ?? Return in about 4 weeks (around 04/09/2019) for follow up on MRI/MRA results.             712  We discussed the expected course, resolution and complications of the diagnosis(es) in detail.  Medication risks, benefits, costs, interactions, and alternatives were discussed as indicated.  I advised her to contact the office if her condition worsens, changes or fails to improve as anticipated. She expressed understanding with the diagnosis(es) and plan.     Barbara [name] is a 53 y.o. female who was evaluated by a video visit encounter for concerns as above. Patient identification was  verified prior to start of the visit. A caregiver was present when appropriate. 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  Pursuant to the emergency declaration under the [place], 1135 waiver authority and the [employer] Act, this Virtual  Visit was conducted, with patient's (and/or legal guardian's) consent, to reduce the patient's risk of exposure to COVID-19 and provide necessary medical care.     Services were provided through a video synchronous discussion virtually to substitute for in-person clinic visit.   Patient was located at home and provider was located at the office.      Signed the office electronically by Lawrence Marseilles, DNP, FNP-BC

## 2019-03-15 ENCOUNTER — Inpatient Hospital Stay: Admit: 2019-03-15 | Payer: PRIVATE HEALTH INSURANCE | Attending: Nurse Practitioner | Primary: Nurse Practitioner

## 2019-03-15 DIAGNOSIS — M4802 Spinal stenosis, cervical region: Secondary | ICD-10-CM

## 2019-03-15 NOTE — Progress Notes (Signed)
Spoke with patient, informed her of results. She was wondering about MRA brain as well but I told her it's still in process. She verbalized understanding.

## 2019-03-19 ENCOUNTER — Encounter

## 2019-03-19 MED ORDER — ATORVASTATIN 10 MG TAB
10 mg | ORAL_TABLET | Freq: Every day | ORAL | 1 refills | Status: DC
Start: 2019-03-19 — End: 2019-09-15

## 2019-03-19 NOTE — Telephone Encounter (Signed)
yours

## 2019-03-24 ENCOUNTER — Inpatient Hospital Stay: Admit: 2019-03-24 | Payer: PRIVATE HEALTH INSURANCE | Attending: Nurse Practitioner | Primary: Nurse Practitioner

## 2019-03-24 DIAGNOSIS — I6501 Occlusion and stenosis of right vertebral artery: Secondary | ICD-10-CM

## 2019-03-25 ENCOUNTER — Telehealth: Attending: Nurse Practitioner | Primary: Nurse Practitioner

## 2019-03-25 ENCOUNTER — Telehealth
Admit: 2019-03-25 | Discharge: 2019-03-25 | Payer: PRIVATE HEALTH INSURANCE | Attending: Nurse Practitioner | Primary: Nurse Practitioner

## 2019-03-25 DIAGNOSIS — M542 Cervicalgia: Secondary | ICD-10-CM

## 2019-03-25 MED ORDER — OXYCODONE-ACETAMINOPHEN 5 MG-325 MG TAB
5-325 mg | ORAL_TABLET | Freq: Three times a day (TID) | ORAL | 0 refills | Status: DC | PRN
Start: 2019-03-25 — End: 2019-04-01

## 2019-03-25 NOTE — Progress Notes (Signed)
Barbara [name] was seen by synchronous (real-time) audio-video technology DOXY on 03/25/2019.      Consent: Barbara [name], who was seen by synchronous (real-time) audio-video technology, and/or her healthcare decision maker, is aware that this patient-initiated, Telehealth encounter on 03/25/2019 is a billable service, with coverage as determined by her insurance carrier. She is aware that she may receive a bill and has provided verbal consent to proceed: yes  712  Subjective:     HPI:  Barbara [name] is a 53 y.o. female who was seen for the following:   Neck pain    She continues with posterior neck pain over the cervical spine, radiating down the shoulders and back when turning her head.     She saw ortho spine specialist Dr. Carlos American who referred her to vascular and PT.    Referred to [facility], vascular who she will be seeing on 04/01/19.     Right now pain is 5/10, but often worse than that. Gabapentin helps. Percocet helped, but she has run out. Denies side effects from it.    I reviewed her recent imaging results. See below.     Review of Systems   Constitutional: Negative for fatigue and fever.   HENT: Negative for congestion and sore throat.    Respiratory: Negative for cough.    Cardiovascular: Negative for chest pain and palpitations.   Genitourinary: Negative for dysuria.   Musculoskeletal: Positive for neck pain and neck stiffness. Negative for back pain.   Neurological: Negative for dizziness and headaches.        Past Medical History, Past Surgery History, Allergies, Social History, and Family History were reviewed and updated.      Patient Active Problem List   Diagnosis Code   ??? Essential hypertension I10   ??? 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 Pain of left hip joint M25.552   ??? Low back pain M54.5   ??? Smoker F17.200   ??? Dupuytren contracture M72.0   ??? S/P mastectomy, left Z90.12   ??? Cigarette nicotine dependence without complication K74.259   ??? Recurrent depression (HCC)  F33.9   ??? History of breast cancer Z85.3   ??? Hypertensive retinopathy H35.039   ??? Aneurysm (HCC) I72.9   ??? 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 DDD (degenerative disc disease), cervical M50.30     Past Medical History:   Diagnosis Date   ??? 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 Atherosclerosis 2020   ??? Back pain    ??? Breast cancer ([place]) 2008 or 2009   ??? Chronic pain    ??? DDD (degenerative disc disease), cervical     multilevel   ??? Diabetes ([place])    ??? Facet joint disease of cervical region    ??? FH: chemotherapy    ??? Hypertension    ??? Hypertensive retinopathy 2020   ??? Nausea & vomiting    ??? Neuropathy    ??? Radiation therapy complication 5638    NOT complication, just radiation   ??? Vertebral artery occlusion, right 2020     Patient Care Team:  Toniann Ket, NP as PCP - General (Nurse Practitioner)  Isaiah Blakes Kerrin Champagne, NP as PCP - [facility] Empaneled Provider  Anastasio Auerbach., RN as Nurse Navigator (Oncology)  Reita Cliche, MD (Breast Surgery)  [place],  Odette Horns, MD as Physician (Physical Medicine and Rehab)    Past Surgical History:   Procedure Laterality Date   ??? BIOPSY BREAST     ??? BIOPSY FINE NEEDLE     ??? HX BREAST AUGMENTATION Right 2010   ??? HX BREAST AUGMENTATION Left 09/17/2017    left breast reconstruction with exchange of tissue expander per breast prostesis and left breast tissue expander performed by [name], MD at [place]   ??? HX BREAST AUGMENTATION Right 04/01/2018    removal of tissue expanders with placment of right permanent breast prostesis performed by [name], MD at [place]   ??? HX BREAST RECONSTRUCTION Right 2011    per pt - attempted latissimus dorsi flap, then saline implant   ??? HX BREAST RECONSTRUCTION Bilateral 06/10/2017    right breast prosthesis and capsule removal and bilateral breast tissue  expander reconstruction.  performed by [name], MD at [place]   ??? HX BREAST RECONSTRUCTION Left 07/09/2017    left breast  EXCISION and closure of wound performed by [name], MD at [place]   ??? HX BREAST RECONSTRUCTION Bilateral     Tissue expanders, to prepare for implants    ??? HX CESAREAN SECTION     ??? HX LUMBAR DISKECTOMY      herniated disc   ??? HX MASTECTOMY Right 2010    Total mastectomy per pt.     ??? HX MASTECTOMY Left 06/10/2017    left mastectomy,Sentinal node biopsies. performed by [name], MD at [facility] MAIN OR   ??? HX MASTECTOMY Bilateral    ??? HX MASTOPEXY (BREAST LIFT) Left 2011   ??? IMPLANT BREAST SILICONE/EQ Right 0347    post mastectomy   ??? THUMB SUPPORT      thumb surgery to mobilize thumb     Family History   Problem Relation Age of Onset   ??? Diabetes Mother    ??? No Known Problems Father      Social History     Tobacco Use   ??? Smoking status: Current Some Day Smoker     Packs/day: 0.50     Years: 15.00     Pack years: 7.50     Types: Cigarettes   ??? Smokeless tobacco: Never Used   ??? Tobacco comment: 5-6 cigs per day patient education given   Substance Use Topics   ??? Alcohol use: No   ??? Drug use: No     Allergies   Allergen Reactions   ??? Tramadol Anaphylaxis   ??? Metformin Diarrhea   ??? Trulicity [Dulaglutide] Nausea Only     Current Outpatient Medications on File Prior to Visit   Medication Sig Dispense Refill   ??? atorvastatin (LIPITOR) 10 mg tablet Take 1 Tab by mouth daily. 90 Tab 1   ??? ramipriL (ALTACE) 10 mg capsule Take 1 capsule by mouth once daily 90 Cap 1   ??? cholecalciferol (VITAMIN D3) (2,000 UNITS /50 MCG) cap capsule Take 2,000 Units by mouth daily. 90 Cap 3   ??? flash glucose scanning reader (FreeStyle Libre 14 Day Reader) misc Used as directed to check blood sugar 4 times a day 1 Each 0   ??? flash glucose sensor (FreeStyle Libre 14 Day Sensor) kit Use as directed to check blood sugar 4 times a day 1 Kit 5   ??? glucose blood VI test strips (FreeStyle Lite Strips) strip Use as directed to check blood sugar 4 times  a day 100 Strip 1    ??? ALPRAZolam (Xanax) 0.5 mg tablet Take 0.5 Tabs by mouth two (2) times daily as needed for Anxiety. Max Daily Amount: 0.5 mg. 30 Tab 2   ??? tiZANidine (ZANAFLEX) 2 mg tablet Take 1 Tab by mouth three (3) times daily as needed for Muscle Spasm(s). 30 Tab 2   ??? diclofenac EC (VOLTAREN) 75 mg EC tablet Take 1 Tab by mouth two (2) times a day. 30 Tab 3   ??? venlafaxine-SR (EFFEXOR-XR) 75 mg capsule Take 3 Caps by mouth daily. 90 Cap 3   ??? albuterol (PROVENTIL HFA, VENTOLIN HFA, PROAIR HFA) 90 mcg/actuation inhaler Take 2 Puffs by inhalation every six (6) hours as needed for Wheezing. 1 Inhaler 3   ??? insulin NPH (NOVOLIN N, HUMULIN N) 100 unit/mL injection 40 Units in the AM, 35 Units in the PM (Patient taking differently: 45 Units in the AM, 40 Units in the PM) 1 Vial 5   ??? ibuprofen (MOTRIN) 600 mg tablet Take 1 Tab by mouth every six (6) hours as needed for Pain. 20 Tab 0   ??? gabapentin (NEURONTIN) 400 mg capsule 800 mg PO TID 270 Cap 3   ??? nystatin (MYCOSTATIN) powder Apply to rash 4 times a day 60 g 3   ??? ketoconazole (NIZORAL) 2 % topical cream Apply  to affected area two (2) times a day. 60 g 3   ??? letrozole (FEMARA) 2.5 mg tablet Take 2.5 mg by mouth daily.       No current facility-administered medications on file prior to visit.      Health Maintenance Due   Topic Date Due   ??? Colonoscopy  11/27/1985   ??? PAP AKA CERVICAL CYTOLOGY  11/27/1988   ??? Shingrix Vaccine Age 23> (1 of 2) 11/27/2017   ??? A1C test (Diabetic or Prediabetic)  02/03/2019         Objective     PHYSICAL EXAMINATION:    Vital Signs: (As obtained by patient/caregiver at home)   No flowsheet data found.       General: Alert, cooperative, no distress   Mental  status: Normal mood, behavior, speech, dress, motor activity, and thought processes, able to follow commands   HENT: Normocephalic, atraumatic   Neck: No visualized mass   Resp: No respiratory distress   Neuro: No gross deficits   Skin: No discoloration or lesions of concern on visible areas    Psychiatric: Normal affect, consistent with stated mood, no evidence of hallucinations     Additional exam findings: none      LABS     TESTS    MRA brain 03/24/19  IMPRESSION:  ??  1.  Redemonstrated right vertebral artery occlusion.  ??  2.  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  ??  3.  Left ICA carotid cave aneurysm, 3 mm.     MRI cervical spine 03/15/19  IMPRESSION:  ??  1.  Small, broad-based right paracentral disc protrusion is noted at C3-4 with  associated mild impingement upon the right side of the cord.  ??  2.  Multilevel degenerative disc and degenerative facet disease.  Overall,  central canal stenosis is mild.  Multilevel foraminal narrowing is present and  is moderate to severe on the right at C3-4  ??   Please refer to the body of the report for a level by level description of the  degenerative changes and associated areas of canal and foraminal encroachment.  CTA neck 03/02/19  IMPRESSION:  ??  1.  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  The right vertebral artery is very diminutive in size and the  intracranial segment is poorly visualized, predominantly ending in the posterior  inferior cerebellar artery which may be stenotic or or partially occluded.  ??  2.  No evidence of hemodynamically significant stenosis or occlusion involving  the main cervical vessels.  ??  Initial interpretation was provided to the emergency room by [facility].  ??  Stenosis Key:  ??  Mild-less than 25%  Mild to moderate- 25-50%  Moderate- 40-60%  Moderate to severe- 50-75%  Severe-greater than 75%  ??          Assessment and Plan     1. Chronic neck pain with history of cervical spinal surgery  Continue percocet and other analgesics.   Reviewed PMP Aware. Dispensing history is appropriate and congruent with patient reported history.   Start PT as directed by ortho. F/u with ortho.   - oxyCODONE-acetaminophen (PERCOCET) 5-325 mg per tablet; Take 1 Tab by mouth every eight (8) hours  as needed for Pain for up to 7 days. Max Daily Amount: 3 Tabs.  Dispense: 10 Tab; Refill: 0    2. Aneurysm ([place])  3. Vertebral artery occlusion, right  Vascular consult as planned    4. DDD (degenerative disc disease), cervical  5. Facet joint disease of cervical region  F/u with ortho               712  We discussed the expected course, resolution and complications of the diagnosis(es) in detail.  Medication risks, benefits, costs, interactions, and alternatives were discussed as indicated.  I advised her to contact the office if her condition worsens, changes or fails to improve as anticipated. She expressed understanding with the diagnosis(es) and plan.     Barbara [name] is a 53 y.o. female who was evaluated by a video visit encounter for concerns as above. Patient identification was verified prior to start of the visit. A caregiver was present when appropriate. Due to this being a Scientist, physiological (During QJJHE-17 public health emergency), evaluation of the following organ systems was limited: Vitals/Constitutional/EENT/Resp/CV/GI/GU/MS/Neuro/Skin/Heme-Lymph-Imm.  Pursuant to the emergency declaration under the [place], 1135 waiver authority and the [employer] Act, this Virtual  Visit was conducted, with patient's (and/or legal guardian's) consent, to reduce the patient's risk of exposure to COVID-19 and provide necessary medical care.     Services were provided through a video synchronous discussion virtually to substitute for in-person clinic visit.   Patient was located at home and provider was located at home.      Signed electronically by Lawrence Marseilles, DNP, FNP-BC

## 2019-03-25 NOTE — Progress Notes (Signed)
Barbara [name] was seen by synchronous (real-time) audio-video technology DOXY on 03/25/2019.      Consent: Barbara [name], who was seen by synchronous (real-time) audio-video technology, and/or her healthcare decision maker, is aware that this patient-initiated, Telehealth encounter on 03/25/2019 is a billable service, with coverage as determined by her insurance carrier. She is aware that she may receive a bill and has provided verbal consent to proceed: yes  712  Subjective:     HPI:  Barbara [name] is a 53 y.o. female who was seen for the following:   Neck pain    She continues with posterior neck pain over the cervical spine, radiating down the shoulders and back when turning her head.     She saw ortho spine specialist Dr. Carlos American who referred her to vascular and PT.    Referred to [facility], vascular who she will be seeing on 04/01/19.     Right now pain is 5/10, but often worse than that. Gabapentin helps. Percocet helped, but she has run out. Denies side effects from it.    I reviewed her recent imaging results. See below.     Review of Systems   Constitutional: Negative for fatigue and fever.   HENT: Negative for congestion and sore throat.    Respiratory: Negative for cough.    Cardiovascular: Negative for chest pain and palpitations.   Genitourinary: Negative for dysuria.   Musculoskeletal: Positive for neck pain and neck stiffness. Negative for back pain.   Neurological: Negative for dizziness and headaches.        Past Medical History, Past Surgery History, Allergies, Social History, and Family History were reviewed and updated.      Patient Active Problem List   Diagnosis Code   ??? Essential hypertension I10   ??? 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 Pain of left hip joint M25.552   ??? Low back pain M54.5   ??? Smoker F17.200   ??? Dupuytren contracture M72.0   ??? S/P mastectomy, left Z90.12   ??? Cigarette nicotine dependence without complication C37.628    ??? Recurrent depression (HCC) F33.9   ??? History of breast cancer Z85.3   ??? Hypertensive retinopathy H35.039   ??? Aneurysm (HCC) I72.9   ??? 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 DDD (degenerative disc disease), cervical M50.30     Past Medical History:   Diagnosis Date   ??? 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 Atherosclerosis 2020   ??? Back pain    ??? Breast cancer ([place]) 2008 or 2009   ??? Chronic pain    ??? DDD (degenerative disc disease), cervical     multilevel   ??? Diabetes ([place])    ??? Facet joint disease of cervical region    ??? FH: chemotherapy    ??? Hypertension    ??? Hypertensive retinopathy 2020   ??? Nausea & vomiting    ??? Neuropathy    ??? Radiation therapy complication 3151    NOT complication, just radiation   ??? Vertebral artery occlusion, right 2020     Patient Care Team:  Toniann Ket, NP as PCP - General (Nurse Practitioner)  Isaiah Blakes Kerrin Champagne, NP as PCP - [facility] Empaneled Provider  Anastasio Auerbach., RN as Nurse Navigator (Oncology)  Reita Cliche, MD (Breast Surgery)  [place],  Odette Horns, MD as Physician (Physical Medicine and Rehab)    Past Surgical History:   Procedure Laterality Date   ??? BIOPSY BREAST     ??? BIOPSY FINE NEEDLE     ??? HX BREAST AUGMENTATION Right 2010   ??? HX BREAST AUGMENTATION Left 09/17/2017    left breast reconstruction with exchange of tissue expander per breast prostesis and left breast tissue expander performed by [name], MD at [place]   ??? HX BREAST AUGMENTATION Right 04/01/2018    removal of tissue expanders with placment of right permanent breast prostesis performed by [name], MD at [place]   ??? HX BREAST RECONSTRUCTION Right 2011    per pt - attempted latissimus dorsi flap, then saline implant   ??? HX BREAST RECONSTRUCTION Bilateral 06/10/2017    right breast prosthesis and capsule removal and bilateral breast tissue   expander reconstruction.  performed by [name], MD at [place]   ??? HX BREAST RECONSTRUCTION Left 07/09/2017    left breast  EXCISION and closure of wound performed by [name], MD at [place]   ??? HX BREAST RECONSTRUCTION Bilateral     Tissue expanders, to prepare for implants    ??? HX CESAREAN SECTION     ??? HX LUMBAR DISKECTOMY      herniated disc   ??? HX MASTECTOMY Right 2010    Total mastectomy per pt.     ??? HX MASTECTOMY Left 06/10/2017    left mastectomy,Sentinal node biopsies. performed by [name], [facility] at [facility] MAIN OR   ??? HX MASTECTOMY Bilateral    ??? HX MASTOPEXY (BREAST LIFT) Left 2011   ??? IMPLANT BREAST SILICONE/EQ Right 1517    post mastectomy   ??? THUMB SUPPORT      thumb surgery to mobilize thumb     Family History   Problem Relation Age of Onset   ??? Diabetes Mother    ??? No Known Problems Father      Social History     Tobacco Use   ??? Smoking status: Current Some Day Smoker     Packs/day: 0.50     Years: 15.00     Pack years: 7.50     Types: Cigarettes   ??? Smokeless tobacco: Never Used   ??? Tobacco comment: 5-6 cigs per day patient education given   Substance Use Topics   ??? Alcohol use: No   ??? Drug use: No     Allergies   Allergen Reactions   ??? Tramadol Anaphylaxis   ??? Metformin Diarrhea   ??? Trulicity [Dulaglutide] Nausea Only     Current Outpatient Medications on File Prior to Visit   Medication Sig Dispense Refill   ??? atorvastatin (LIPITOR) 10 mg tablet Take 1 Tab by mouth daily. 90 Tab 1   ??? ramipriL (ALTACE) 10 mg capsule Take 1 capsule by mouth once daily 90 Cap 1   ??? cholecalciferol (VITAMIN D3) (2,000 UNITS /50 MCG) cap capsule Take 2,000 Units by mouth daily. 90 Cap 3   ??? flash glucose scanning reader (FreeStyle Libre 14 Day Reader) misc Used as directed to check blood sugar 4 times a day 1 Each 0   ??? flash glucose sensor (FreeStyle Libre 14 Day Sensor) kit Use as directed to check blood sugar 4 times a day 1 Kit 5    ??? glucose blood VI test strips (FreeStyle Lite Strips) strip Use as directed to check blood sugar 4  times a day 100 Strip 1   ??? ALPRAZolam (Xanax) 0.5 mg tablet Take 0.5 Tabs by mouth two (2) times daily as needed for Anxiety. Max Daily Amount: 0.5 mg. 30 Tab 2   ??? tiZANidine (ZANAFLEX) 2 mg tablet Take 1 Tab by mouth three (3) times daily as needed for Muscle Spasm(s). 30 Tab 2   ??? diclofenac EC (VOLTAREN) 75 mg EC tablet Take 1 Tab by mouth two (2) times a day. 30 Tab 3   ??? venlafaxine-SR (EFFEXOR-XR) 75 mg capsule Take 3 Caps by mouth daily. 90 Cap 3   ??? albuterol (PROVENTIL HFA, VENTOLIN HFA, PROAIR HFA) 90 mcg/actuation inhaler Take 2 Puffs by inhalation every six (6) hours as needed for Wheezing. 1 Inhaler 3   ??? insulin NPH (NOVOLIN N, HUMULIN N) 100 unit/mL injection 40 Units in the AM, 35 Units in the PM (Patient taking differently: 45 Units in the AM, 40 Units in the PM) 1 Vial 5   ??? ibuprofen (MOTRIN) 600 mg tablet Take 1 Tab by mouth every six (6) hours as needed for Pain. 20 Tab 0   ??? gabapentin (NEURONTIN) 400 mg capsule 800 mg PO TID 270 Cap 3   ??? nystatin (MYCOSTATIN) powder Apply to rash 4 times a day 60 g 3   ??? ketoconazole (NIZORAL) 2 % topical cream Apply  to affected area two (2) times a day. 60 g 3   ??? letrozole (FEMARA) 2.5 mg tablet Take 2.5 mg by mouth daily.       No current facility-administered medications on file prior to visit.      Health Maintenance Due   Topic Date Due   ??? Colonoscopy  11/27/1985   ??? PAP AKA CERVICAL CYTOLOGY  11/27/1988   ??? Shingrix Vaccine Age 74> (1 of 2) 11/27/2017   ??? A1C test (Diabetic or Prediabetic)  02/03/2019         Objective     PHYSICAL EXAMINATION:    Vital Signs: (As obtained by patient/caregiver at home)   No flowsheet data found.       General: Alert, cooperative, no distress   Mental  status: Normal mood, behavior, speech, dress, motor activity, and thought processes, able to follow commands   HENT: Normocephalic, atraumatic    Neck: No visualized mass   Resp: No respiratory distress   Neuro: No gross deficits   Skin: No discoloration or lesions of concern on visible areas   Psychiatric: Normal affect, consistent with stated mood, no evidence of hallucinations     Additional exam findings: none      LABS     TESTS    MRA brain 03/24/19  IMPRESSION:  ??  1.  Redemonstrated right vertebral artery occlusion.  ??  2.  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  ??  3.  Left ICA carotid cave aneurysm, 3 mm.     MRI cervical spine 03/15/19  IMPRESSION:  ??  1.  Small, broad-based right paracentral disc protrusion is noted at C3-4 with  associated mild impingement upon the right side of the cord.  ??  2.  Multilevel degenerative disc and degenerative facet disease.  Overall,  central canal stenosis is mild.  Multilevel foraminal narrowing is present and  is moderate to severe on the right at C3-4  ??   Please refer to the body of the report for a level by level description of the  degenerative changes and associated areas of canal and foraminal encroachment.  CTA neck 03/02/19  IMPRESSION:  ??  1.  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  The right vertebral artery is very diminutive in size and the  intracranial segment is poorly visualized, predominantly ending in the posterior  inferior cerebellar artery which may be stenotic or or partially occluded.  ??  2.  No evidence of hemodynamically significant stenosis or occlusion involving  the main cervical vessels.  ??  Initial interpretation was provided to the emergency room by [facility].  ??  Stenosis Key:  ??  Mild-less than 25%  Mild to moderate- 25-50%  Moderate- 40-60%  Moderate to severe- 50-75%  Severe-greater than 75%  ??          Assessment and Plan     1. Chronic neck pain with history of cervical spinal surgery  Continue percocet and other analgesics.   Reviewed PMP Aware. Dispensing history is appropriate and congruent with patient reported history.    Start PT as directed by ortho. F/u with ortho.   - oxyCODONE-acetaminophen (PERCOCET) 5-325 mg per tablet; Take 1 Tab by mouth every eight (8) hours as needed for Pain for up to 7 days. Max Daily Amount: 3 Tabs.  Dispense: 10 Tab; Refill: 0    2. Aneurysm ([place])  3. Vertebral artery occlusion, right  Vascular consult as planned    4. DDD (degenerative disc disease), cervical  5. Facet joint disease of cervical region  F/u with ortho               712  We discussed the expected course, resolution and complications of the diagnosis(es) in detail.  Medication risks, benefits, costs, interactions, and alternatives were discussed as indicated.  I advised her to contact the office if her condition worsens, changes or fails to improve as anticipated. She expressed understanding with the diagnosis(es) and plan.     Barbara [name] is a 53 y.o. female who was evaluated by a video visit encounter for concerns as above. Patient identification was verified prior to start of the visit. A caregiver was present when appropriate. Due to this being a Scientist, physiological (During PNTIR-44 public health emergency), evaluation of the following organ systems was limited: Vitals/Constitutional/EENT/Resp/CV/GI/GU/MS/Neuro/Skin/Heme-Lymph-Imm.  Pursuant to the emergency declaration under the [place], 1135 waiver authority and the [employer] Act, this Virtual  Visit was conducted, with patient's (and/or legal guardian's) consent, to reduce the patient's risk of exposure to COVID-19 and provide necessary medical care.     Services were provided through a video synchronous discussion virtually to substitute for in-person clinic visit.   Patient was located at home and provider was located at home.      Signed electronically by Lawrence Marseilles, DNP, FNP-BC

## 2019-04-01 ENCOUNTER — Encounter

## 2019-04-01 MED ORDER — OXYCODONE-ACETAMINOPHEN 5 MG-325 MG TAB
5-325 mg | ORAL_TABLET | Freq: Three times a day (TID) | ORAL | 0 refills | Status: AC | PRN
Start: 2019-04-01 — End: 2019-04-08

## 2019-04-01 NOTE — Telephone Encounter (Signed)
I sent in another 10 tabs of the percocet.

## 2019-04-01 NOTE — Telephone Encounter (Signed)
Pt called in and was wanting to know if Lelan Pons would be able to refill her oxyCODONE-acetaminophen (PERCOCET) 5-325 mg per tablet and send it to the [facility] on file. I did advise she may need to make an appt but she did just have a VV on 03/25/2019. She also wanted to let Lelan Pons know that she was referred to neurology by her pain management doctor. Please advise.

## 2019-04-01 NOTE — Telephone Encounter (Signed)
Nurse spoke to patient, advised that 10 tabs sent to pharmacy, this encounter will be closed.

## 2019-04-01 NOTE — Telephone Encounter (Signed)
Please advise.

## 2019-04-02 NOTE — Telephone Encounter (Signed)
Please advise.

## 2019-04-02 NOTE — Telephone Encounter (Signed)
Pt called in and was wanting to know what she needed to do to get her handicap sticker renewed. VV or F2F? Please advise.

## 2019-04-05 ENCOUNTER — Encounter: Primary: Nurse Practitioner

## 2019-04-05 NOTE — Telephone Encounter (Signed)
Nurse called and left detailed message. Asked patient to call back if she would like it mailed or picked up.

## 2019-04-05 NOTE — Telephone Encounter (Signed)
I've seen her recently. Form filled out and ready to mail or pick up.

## 2019-04-20 ENCOUNTER — Telehealth
Admit: 2019-04-20 | Discharge: 2019-04-20 | Payer: PRIVATE HEALTH INSURANCE | Attending: Nurse Practitioner | Primary: Nurse Practitioner

## 2019-04-20 NOTE — Progress Notes (Signed)
Patient not seen. Rescheduled for another day.

## 2019-04-26 ENCOUNTER — Encounter

## 2019-04-26 NOTE — Telephone Encounter (Signed)
Pt called in and informed me that she needed refills on the medications listed and then she also was wanting to know if she could please get a referral for a neck doctor as well as a hip doctor. She stated the pain in her neck is getting worse and now causing headaches. And then she wanted to know if there was a status on her handicap sticker that she had requested a few times a little bit ago. Please advise

## 2019-04-26 NOTE — Telephone Encounter (Signed)
Pt. Is also requesting refill on Percocet. Pt. Was last seen on 04/20/2019.

## 2019-04-27 ENCOUNTER — Telehealth: Attending: Family Medicine | Primary: Nurse Practitioner

## 2019-04-27 ENCOUNTER — Telehealth
Admit: 2019-04-27 | Discharge: 2019-04-27 | Payer: PRIVATE HEALTH INSURANCE | Attending: Family Medicine | Primary: Nurse Practitioner

## 2019-04-27 ENCOUNTER — Encounter: Admit: 2019-04-27 | Discharge: 2019-04-27 | Payer: PRIVATE HEALTH INSURANCE | Primary: Nurse Practitioner

## 2019-04-27 DIAGNOSIS — M542 Cervicalgia: Secondary | ICD-10-CM

## 2019-04-27 DIAGNOSIS — Z9889 Other specified postprocedural states: Secondary | ICD-10-CM

## 2019-04-27 MED ORDER — FREESTYLE LITE STRIPS
ORAL_STRIP | 1 refills | Status: AC
Start: 2019-04-27 — End: ?

## 2019-04-27 MED ORDER — OXYCODONE-ACETAMINOPHEN 5 MG-325 MG TAB
5-325 mg | ORAL_TABLET | Freq: Three times a day (TID) | ORAL | 0 refills | Status: AC | PRN
Start: 2019-04-27 — End: 2019-05-04

## 2019-04-27 NOTE — Progress Notes (Signed)
Barbara [name] is a 53 y.o. Caucasian female and presents with    Chief Complaint   Patient presents with   ??? Depression   ??? Anxiety   ??? Hypertension   ??? Diabetes     Barbara [name], who was evaluated through a synchronous (real-time) audio-video encounter, and/or her healthcare decision maker, is aware that it is a billable service, with coverage as determined by her insurance carrier. She provided verbal consent to proceed: Yes, and patient identification was verified. It was conducted pursuant to the emergency declaration under the [place], [place] waiver authority and the [employer] Act. A caregiver was present when appropriate. Ability to conduct physical exam was limited. I was in the office. The patient was at home.    Subjective:  She has chronic pain, diabetes mellitus, breast cancer and anxiety; she has aneurysm and is requesting referral to neurology.    Diabetes, type 2 follow up:  Taking medications as prescribed: Yes  Following diabetic diet: No  Exercise: No   Checking blood sugar and needs refill of test strips.  Symptoms of hyperglycemia: excessive thirst and polyuria  Symptoms of hypoglycemia.l  Last A1C was 11.6.   Plan:  Reviewed diabetic diet in detail. She eats twice a day at the same times that she takes her insulin. Advised to limited her carbs to 30 g per meal. Increase glucose monitoring to 4 times a day.  Ordered freestyle libre.  Follow up in 4 weeks, sooner as needed.  ??  Anxiety:   Chronic. Some days worse depending on the situation. Worse with the coronavirus pandemic. Takes 1/2 tablet of xanax up to twice a day which has been effective.  Plan:  Continue xanax as needed. Reviewed PMP Aware. Dispensing history is appropriate and congruent with patient reported history.   ??  Breast cancer:   Taking letrozole. Followed by oncology.  Plan:   Continue onc f/u.  ??  Neck pain:    Chronic lower back  pain. Now reporting pain in the posterior neck. Aching. Worse with movement. Voltaren does not help.  Dx: Muscle spasm  Plan:  Use oxycodone with caution;   Stretches and gentle rom exercises. Topical heat.   ??  ??    ROS   General ROS: negative for - chills or fever  Psychological ROS: positive for - anxiety  Ophthalmic ROS: negative for - blurry vision  ENT ROS: negative for - headaches or sore throat  Respiratory ROS: no cough, shortness of breath, or wheezing  Cardiovascular ROS: no chest pain or dyspnea on exertion  Gastrointestinal ROS: no abdominal pain, change in bowel habits, or black or bloody stools  Genito-Urinary ROS: no dysuria, trouble voiding, or hematuria  Dermatological ROS: negative for - rash or skin lesion changes    All other systems reviewed and are negative.      Objective:  There were no vitals filed for this visit.    alert, well appearing, and in no distress, oriented to person, place, and time and normal appearing weight  Mental status - anxious  Neck - pain with motion  Chest - normal work of breathing  Neurological - cranial nerves II through XII intact    LABS     TESTS      Assessment/Plan:    1. Chronic neck pain with history of cervical spinal surgery  This is a chronic problem that is worsened.  Per review of  available records and patient???s PMP, there are not sign of overuse, misuse, diversion, or concerning side effects. Today we reviewed: the risk of overdose, addiction, and dependency proper storage and disposal of medications the goals of treatment (improve functionality, quality of life, and pain)  The following changes were made to the patient???s current treatment plan: medications adjusted, see orders.          - REFERRAL TO PHYSICAL THERAPY  - oxyCODONE-acetaminophen (PERCOCET) 5-325 mg per tablet; Take 1 Tab by mouth every eight (8) hours as needed for Pain for up to 7 days. Max Daily Amount: 3 Tabs.  Dispense: 10 Tab; Refill: 0    2. Depression with anxiety  Continue  treatment    3. Type 2 diabetes mellitus without complication, with long-term current use of insulin (HCC)  Goal hgb a1c <7; lab pending  - glucose blood VI test strips (FreeStyle Lite Strips) strip; Use as directed to check blood sugar 4 times a day  Dispense: 100 Strip; Refill: 1    4. Aneurysm ([place])  Pt needs evaluation by neurovascular surgeon  - REFERRAL TO NEUROLOGY    Lab review: orders written for new lab studies as appropriate; see orders      I have discussed the diagnosis with the patient and the intended plan as seen in the above orders.  I have discussed medication side effects and warnings with the patient as well. I have reviewed the plan of care with the patient, accepted their input and they are in agreement with the treatment goals.

## 2019-04-27 NOTE — Progress Notes (Signed)
Barbara [name] presents today for   Chief Complaint   Patient presents with   . Depression   . Anxiety   . Hypertension   . Diabetes       Is someone accompanying this pt? na    Is the patient using any DME equipment during OV? na    Depression Screening:  3 most recent PHQ Screens 02/23/2018   Little interest or pleasure in doing things Several days   Feeling down, depressed, irritable, or hopeless More than half the days   Total Score PHQ 2 3   Trouble falling or staying asleep, or sleeping too much -   Feeling tired or having little energy -   Poor appetite, weight loss, or overeating -   Feeling bad about yourself - or that you are a failure or have let yourself or your family down -   Trouble concentrating on things such as school, work, reading, or watching TV -   Moving or speaking so slowly that other people could have noticed; or the opposite being so fidgety that others notice -   Thoughts of being better off dead, or hurting yourself in some way -   PHQ 9 Score -   How difficult have these problems made it for you to do your work, take care of your home and get along with others -       Learning Assessment:  Learning Assessment 02/23/2018   PRIMARY LEARNER Patient   HIGHEST LEVEL OF EDUCATION - PRIMARY LEARNER  2 YEARS OF COLLEGE   BARRIERS PRIMARY LEARNER NONE   CO-LEARNER CAREGIVER No   PRIMARY LANGUAGE ENGLISH   LEARNER PREFERENCE PRIMARY LISTENING   ANSWERED BY patient   RELATIONSHIP SELF       Abuse Screening:  Abuse Screening Questionnaire 10/23/2018   Do you ever feel afraid of your partner? N   Are you in a relationship with someone who physically or mentally threatens you? N   Is it safe for you to go home? Y       Fall Risk  Fall Risk Assessment, last 12 mths 02/23/2018   Able to walk? Yes   Fall in past 12 months? No       Health Maintenance reviewed and discussed and ordered per Provider.    Health Maintenance Due   Topic Date Due   . Colonoscopy  11/27/1985   . PAP AKA CERVICAL CYTOLOGY   11/27/1988   . Shingrix Vaccine Age 65> (1 of 2) 11/27/2017   . A1C test (Diabetic or Prediabetic)  02/03/2019   .      Coordination of Care:  1. Have you been to the ER, urgent care clinic since your last visit? Hospitalized since your last visit? no    2. Have you seen or consulted any other health care providers outside of the [facility] since your last visit? Include any pap smears or colon screening. no      Last PMP Checked na  Last UDS Checked na  Last Pain contract signed: na    Patient concerns today: med refills

## 2019-04-27 NOTE — Telephone Encounter (Signed)
Patient is asking if her paperwork can be filled out to get a handicap sticker. Patient explained that she doesn't have the paperwork but the doctor should have a copy of a paperwork. Patient asking to be called back once its ready for pick up.

## 2019-04-27 NOTE — Telephone Encounter (Signed)
Form is up front waiting for pick up. Patient verbalized understanding.

## 2019-04-27 NOTE — Progress Notes (Signed)
Barbara [name] is a 53 y.o. Caucasian female and presents with    Chief Complaint   Patient presents with   ??? Depression   ??? Anxiety   ??? Hypertension   ??? Diabetes     Barbara [name], who was evaluated through a synchronous (real-time) audio-video encounter, and/or her healthcare decision maker, is aware that it is a billable service, with coverage as determined by her insurance carrier. She provided verbal consent to proceed: Yes, and patient identification was verified. It was conducted pursuant to the emergency declaration under the [place], [place] waiver authority and the [employer] Act. A caregiver was present when appropriate. Ability to conduct physical exam was limited. I was in the office. The patient was at home.    Subjective:  She has chronic pain, diabetes mellitus, breast cancer and anxiety; she has aneurysm and is requesting referral to neurology.    Diabetes, type 2 follow up:  Taking medications as prescribed: Yes  Following diabetic diet: No  Exercise: No   Checking blood sugar and needs refill of test strips.  Symptoms of hyperglycemia: excessive thirst and polyuria  Symptoms of hypoglycemia.l  Last A1C was 11.6.   Plan:  Reviewed diabetic diet in detail. She eats twice a day at the same times that she takes her insulin. Advised to limited her carbs to 30 g per meal. Increase glucose monitoring to 4 times a day.  Ordered freestyle libre.  Follow up in 4 weeks, sooner as needed.  ??  Anxiety:   Chronic. Some days worse depending on the situation. Worse with the coronavirus pandemic. Takes 1/2 tablet of xanax up to twice a day which has been effective.  Plan:  Continue xanax as needed. Reviewed PMP Aware. Dispensing history is appropriate and congruent with patient reported history.   ??  Breast cancer:   Taking letrozole. Followed by oncology.  Plan:   Continue onc f/u.  ??  Neck pain:     Chronic lower back pain. Now reporting pain in the posterior neck. Aching. Worse with movement. Voltaren does not help.  Dx: Muscle spasm  Plan:  Use oxycodone with caution;   Stretches and gentle rom exercises. Topical heat.   ??  ??    ROS   General ROS: negative for - chills or fever  Psychological ROS: positive for - anxiety  Ophthalmic ROS: negative for - blurry vision  ENT ROS: negative for - headaches or sore throat  Respiratory ROS: no cough, shortness of breath, or wheezing  Cardiovascular ROS: no chest pain or dyspnea on exertion  Gastrointestinal ROS: no abdominal pain, change in bowel habits, or black or bloody stools  Genito-Urinary ROS: no dysuria, trouble voiding, or hematuria  Dermatological ROS: negative for - rash or skin lesion changes    All other systems reviewed and are negative.      Objective:  There were no vitals filed for this visit.    alert, well appearing, and in no distress, oriented to person, place, and time and normal appearing weight  Mental status - anxious  Neck - pain with motion  Chest - normal work of breathing  Neurological - cranial nerves II through XII intact    LABS     TESTS      Assessment/Plan:    1. Chronic neck pain with history of cervical spinal surgery  This is a chronic problem that is worsened.  Per review of  available records and patient???s PMP, there are not sign of overuse, misuse, diversion, or concerning side effects. Today we reviewed: the risk of overdose, addiction, and dependency proper storage and disposal of medications the goals of treatment (improve functionality, quality of life, and pain)  The following changes were made to the patient???s current treatment plan: medications adjusted, see orders.          - REFERRAL TO PHYSICAL THERAPY  - oxyCODONE-acetaminophen (PERCOCET) 5-325 mg per tablet; Take 1 Tab by mouth every eight (8) hours as needed for Pain for up to 7 days. Max Daily Amount: 3 Tabs.  Dispense: 10 Tab; Refill: 0     2. Depression with anxiety  Continue treatment    3. Type 2 diabetes mellitus without complication, with long-term current use of insulin (HCC)  Goal hgb a1c <7; lab pending  - glucose blood VI test strips (FreeStyle Lite Strips) strip; Use as directed to check blood sugar 4 times a day  Dispense: 100 Strip; Refill: 1    4. Aneurysm ([place])  Pt needs evaluation by neurovascular surgeon  - REFERRAL TO NEUROLOGY    Lab review: orders written for new lab studies as appropriate; see orders      I have discussed the diagnosis with the patient and the intended plan as seen in the above orders.  I have discussed medication side effects and warnings with the patient as well. I have reviewed the plan of care with the patient, accepted their input and they are in agreement with the treatment goals.

## 2019-04-27 NOTE — Progress Notes (Signed)
Barbara [name] presents today for   Chief Complaint   Patient presents with   ??? Depression   ??? Anxiety   ??? Hypertension   ??? Diabetes       Is someone accompanying this pt? na    Is the patient using any DME equipment during OV? na    Depression Screening:  3 most recent PHQ Screens 02/23/2018   Little interest or pleasure in doing things Several days   Feeling down, depressed, irritable, or hopeless More than half the days   Total Score PHQ 2 3   Trouble falling or staying asleep, or sleeping too much -   Feeling tired or having little energy -   Poor appetite, weight loss, or overeating -   Feeling bad about yourself - or that you are a failure or have let yourself or your family down -   Trouble concentrating on things such as school, work, reading, or watching TV -   Moving or speaking so slowly that other people could have noticed; or the opposite being so fidgety that others notice -   Thoughts of being better off dead, or hurting yourself in some way -   PHQ 9 Score -   How difficult have these problems made it for you to do your work, take care of your home and get along with others -       Learning Assessment:  Learning Assessment 02/23/2018   PRIMARY LEARNER Patient   HIGHEST LEVEL OF EDUCATION - PRIMARY LEARNER  2 YEARS OF COLLEGE   BARRIERS PRIMARY LEARNER NONE   CO-LEARNER CAREGIVER No   PRIMARY LANGUAGE ENGLISH   LEARNER PREFERENCE PRIMARY LISTENING   ANSWERED BY patient   RELATIONSHIP SELF       Abuse Screening:  Abuse Screening Questionnaire 10/23/2018   Do you ever feel afraid of your partner? N   Are you in a relationship with someone who physically or mentally threatens you? N   Is it safe for you to go home? Y       Fall Risk  Fall Risk Assessment, last 12 mths 02/23/2018   Able to walk? Yes   Fall in past 12 months? No       Health Maintenance reviewed and discussed and ordered per Provider.    Health Maintenance Due   Topic Date Due   ??? Colonoscopy  11/27/1985    ??? PAP AKA CERVICAL CYTOLOGY  11/27/1988   ??? Shingrix Vaccine Age 61> (1 of 2) 11/27/2017   ??? A1C test (Diabetic or Prediabetic)  02/03/2019   .      Coordination of Care:  1. Have you been to the ER, urgent care clinic since your last visit? Hospitalized since your last visit? no    2. Have you seen or consulted any other health care providers outside of the [place] since your last visit? Include any pap smears or colon screening. no      Last PMP Checked na  Last UDS Checked na  Last Pain contract signed: na    Patient concerns today: med refills

## 2019-04-27 NOTE — Telephone Encounter (Signed)
Pt is scheduled to be seen today 04/17/2019.

## 2019-04-28 LAB — METABOLIC PANEL, COMPREHENSIVE
A-G Ratio: 1.8 ratio (ref 1.1–2.6)
ALT (SGPT): 16 U/L (ref 5–40)
AST (SGOT): 20 U/L (ref 10–37)
Albumin: 4.4 g/dL (ref 3.5–5.0)
Alk. phosphatase: 161 U/L — ABNORMAL HIGH (ref 25–115)
Anion gap: 12 mmol/L (ref 3.0–15.0)
BUN: 5 mg/dL — ABNORMAL LOW (ref 6–22)
Bilirubin, total: 0.2 mg/dL (ref 0.2–1.2)
CO2: 27 mmol/L (ref 20–32)
Calcium: 9.3 mg/dL (ref 8.4–10.5)
Chloride: 101 mmol/L (ref 98–110)
Creatinine: 0.5 mg/dL (ref 0.5–1.2)
GFRAA: >60 (ref >60.0)
GFRNA: >60 (ref >60.0)
Globulin: 2.5 g/dL (ref 2.0–4.0)
Glucose: 331 mg/dL — ABNORMAL HIGH (ref 70–99)
Potassium: 4 mmol/L (ref 3.5–5.5)
Protein, total: 6.9 g/dL (ref 6.4–8.3)
Sodium: 140 mmol/L (ref 133–145)

## 2019-04-28 LAB — URINALYSIS W/MICROSCOPIC
Bilirubin: NEGATIVE
Blood: NEGATIVE
Ketone: NEGATIVE mg/dL
Leukocyte Esterase: NEGATIVE
Nitrites: NEGATIVE
Protein: NEGATIVE mg/dL
Specific Gravity: 1.031 — ABNORMAL HIGH (ref 1.005–1.03)
Urobilinogen: <2 mg/dL (ref <2.0)
pH (UA): 7 pH (ref 5.0–8.0)

## 2019-04-28 LAB — CBC WITH AUTOMATED DIFF
ABS. BASOPHILS: 0.1 10*3/uL (ref 0.0–0.2)
ABS. EOSINOPHILS: 0.1 10*3/uL (ref 0.0–0.5)
ABS. MONOCYTES: 0.7 10*3/uL (ref 0.1–1.0)
ABS. NEUTROPHILS: 5.6 10*3/uL (ref 1.8–7.7)
ABSOLUTE LYMPHOCYTE COUNT: 3.4 10*3/uL (ref 1.0–4.8)
BASOPHILS: 1 % (ref 0–2)
EOSINOPHILS: 1 % (ref 0–6)
HCT: 42.7 % (ref 35.1–48.0)
HGB: 13.7 g/dL (ref 11.7–16.0)
Lymphocytes: 34 % (ref 20–45)
MCH: 31 pg (ref 26–34)
MCHC: 32 g/dL (ref 31–36)
MCV: 97 fL (ref 81–99)
MONOCYTES: 7 % (ref 3–12)
MPV: 10.8 fL (ref 9.0–13.0)
NEUTROPHILS: 57 % (ref 40–75)
PLATELET: 243 10*3/uL (ref 140–440)
RBC: 4.4 M/uL (ref 3.80–5.20)
RDW: 12.9 % (ref 10.0–15.5)
WBC: 9.9 10*3/uL (ref 4.0–11.0)

## 2019-04-28 LAB — HEMOGLOBIN A1C W/O EAG
AVERAGE GLUCOSE: 248 mg/dL — ABNORMAL HIGH (ref 91–123)
AVG GLU: 248 mg/dL — ABNORMAL HIGH (ref 91–123)
Hemoglobin A1C: 10.3 % — ABNORMAL HIGH (ref 4.8–5.6)
Hemoglobin A1c: 10.3 % — ABNORMAL HIGH (ref 4.8–5.6)

## 2019-04-28 LAB — COMPREHENSIVE METABOLIC PANEL
ALT: 16 U/L (ref 5–40)
AST: 20 U/L (ref 10–37)
Albumin/Globulin Ratio: 1.8 ratio (ref 1.1–2.6)
Albumin: 4.4 g/dL (ref 3.5–5.0)
Alkaline Phosphatase: 161 U/L — ABNORMAL HIGH (ref 25–115)
Anion Gap: 12 mmol/L (ref 3.0–15.0)
BUN: 5 mg/dL — ABNORMAL LOW (ref 6–22)
CO2: 27 mmol/L (ref 20–32)
Calcium: 9.3 mg/dL (ref 8.4–10.5)
Chloride: 101 mmol/L (ref 98–110)
Creatinine: 0.5 mg/dL (ref 0.5–1.2)
GFR African American: >60 (ref >60.0)
GFR Non-African American: >60 (ref >60.0)
Globulin: 2.5 g/dL (ref 2.0–4.0)
Glucose: 331 mg/dL — ABNORMAL HIGH (ref 70–99)
Potassium: 4 mmol/L (ref 3.5–5.5)
Sodium: 140 mmol/L (ref 133–145)
Total Bilirubin: 0.2 mg/dL (ref 0.2–1.2)
Total Protein: 6.9 g/dL (ref 6.4–8.3)

## 2019-04-28 LAB — CBC WITH AUTO DIFFERENTIAL
ABSOLUTE LYMPHOCYTE COUNT, 10803: 3.4 10*3/uL (ref 1.0–4.8)
Basophils %: 1 % (ref 0–2)
Basophils Absolute: 0.1 10*3/uL (ref 0.0–0.2)
Eosinophils %: 1 % (ref 0–6)
Eosinophils Absolute: 0.1 10*3/uL (ref 0.0–0.5)
Hematocrit: 42.7 % (ref 35.1–48.0)
Hemoglobin: 13.7 g/dL (ref 11.7–16.0)
Lymphocytes %: 34 % (ref 20–45)
MCH: 31 pg (ref 26–34)
MCHC: 32 g/dL (ref 31–36)
MCV: 97 fL (ref 81–99)
MPV: 10.8 fL (ref 9.0–13.0)
Monocytes %: 7 % (ref 3–12)
Monocytes Absolute: 0.7 10*3/uL (ref 0.1–1.0)
Neutrophils %: 57 % (ref 40–75)
Neutrophils Absolute: 5.6 10*3/uL (ref 1.8–7.7)
Platelets: 243 10*3/uL (ref 140–440)
RBC: 4.4 M/uL (ref 3.80–5.20)
RDW: 12.9 % (ref 10.0–15.5)
WBC: 9.9 10*3/uL (ref 4.0–11.0)

## 2019-04-28 LAB — URINALYSIS WITH MICROSCOPIC
Bilirubin, Urine: NEGATIVE
Blood, Urine: NEGATIVE
Ketones, Urine: NEGATIVE mg/dL
Leukocyte Esterase, Urine: NEGATIVE
Nitrite, Urine: NEGATIVE
Protein, UA: NEGATIVE mg/dL
Specific Gravity, UA: 1.031 NA — ABNORMAL HIGH (ref 1.005–1.03)
Urobilinogen, Urine: <2 mg/dL (ref <2.0)
pH, UA: 7 pH (ref 5.0–8.0)

## 2019-05-07 ENCOUNTER — Encounter

## 2019-05-07 MED ORDER — FLUCONAZOLE 150 MG TAB
150 mg | ORAL_TABLET | Freq: Once | ORAL | 0 refills | Status: AC
Start: 2019-05-07 — End: 2019-05-07

## 2019-05-07 NOTE — Telephone Encounter (Signed)
Pt called in and would like a call back with the results from her labs and she would also like to speak with nurse as well. Please advise.

## 2019-05-07 NOTE — Telephone Encounter (Signed)
Pt called in and informed me that she did not need a prescription for a yeast infection and asked for a call back from the nurse. She would not tell me what kind of antibiotic she needed. Please advise.

## 2019-05-07 NOTE — Telephone Encounter (Signed)
Nurse spoke to patient concerning labs. Pt verbalized understanding,

## 2019-05-07 NOTE — Telephone Encounter (Signed)
Wednesday, May 12, 2019 10:15 AM  Pt is scheduled.

## 2019-05-07 NOTE — Telephone Encounter (Signed)
I sent the result note. Please call her.

## 2019-05-07 NOTE — Telephone Encounter (Signed)
Please advise

## 2019-05-09 ENCOUNTER — Emergency Department: Admit: 2019-05-09 | Payer: PRIVATE HEALTH INSURANCE | Primary: Nurse Practitioner

## 2019-05-09 ENCOUNTER — Inpatient Hospital Stay
Admit: 2019-05-09 | Discharge: 2019-05-10 | Disposition: A | Discharge status: Home / Self Care | Payer: PRIVATE HEALTH INSURANCE | Attending: Emergency Medicine

## 2019-05-09 DIAGNOSIS — G8929 Other chronic pain: Secondary | ICD-10-CM

## 2019-05-09 LAB — CBC WITH AUTOMATED DIFF
ABS. BASOPHILS: 0 10*3/uL (ref 0.0–0.1)
ABS. EOSINOPHILS: 0.2 10*3/uL (ref 0.0–0.4)
ABS. LYMPHOCYTES: 4.2 10*3/uL — ABNORMAL HIGH (ref 0.9–3.6)
ABS. MONOCYTES: 0.6 10*3/uL (ref 0.05–1.2)
ABS. NEUTROPHILS: 5.5 10*3/uL (ref 1.8–8.0)
BASOPHILS: 0 % (ref 0–2)
EOSINOPHILS: 1 % (ref 0–5)
HCT: 44.7 % (ref 35.0–45.0)
HGB: 15.4 g/dL (ref 12.0–16.0)
LYMPHOCYTES: 40 % (ref 21–52)
MCH: 32.6 PG (ref 24.0–34.0)
MCHC: 34.5 g/dL (ref 31.0–37.0)
MCV: 94.7 FL (ref 74.0–97.0)
MONOCYTES: 6 % (ref 3–10)
MPV: 11.2 FL (ref 9.2–11.8)
NEUTROPHILS: 53 % (ref 40–73)
PLATELET: 215 10*3/uL (ref 135–420)
RBC: 4.72 M/uL (ref 4.20–5.30)
RDW: 12.9 % (ref 11.6–14.5)
WBC: 10.5 10*3/uL (ref 4.6–13.2)

## 2019-05-09 LAB — METABOLIC PANEL, BASIC
Anion gap: 4 mmol/L (ref 3.0–18)
BUN/Creatinine ratio: 16 (ref 12–20)
BUN: 6 MG/DL — ABNORMAL LOW (ref 7.0–18)
CO2: 27 mmol/L (ref 21–32)
Calcium: 9.1 MG/DL (ref 8.5–10.1)
Chloride: 111 mmol/L (ref 100–111)
Creatinine: 0.38 MG/DL — ABNORMAL LOW (ref 0.6–1.3)
GFR est AA: >60 mL/min/{1.73_m2} (ref >60)
GFR est non-AA: >60 mL/min/{1.73_m2} (ref >60)
Glucose: 72 mg/dL — ABNORMAL LOW (ref 74–99)
Potassium: 4.7 mmol/L (ref 3.5–5.5)
Sodium: 142 mmol/L (ref 136–145)

## 2019-05-09 LAB — GLUCOSE, POC
Glucose (POC): 77 mg/dL (ref 70–110)
Glucose (POC): 191 mg/dL — ABNORMAL HIGH (ref 70–110)

## 2019-05-09 LAB — CBC WITH AUTO DIFFERENTIAL
Basophils %: 0 % (ref 0–2)
Basophils Absolute: 0 10*3/uL (ref 0.0–0.1)
Eosinophils %: 1 % (ref 0–5)
Eosinophils Absolute: 0.2 10*3/uL (ref 0.0–0.4)
Hematocrit: 44.7 % (ref 35.0–45.0)
Hemoglobin: 15.4 g/dL (ref 12.0–16.0)
Lymphocytes %: 40 % (ref 21–52)
Lymphocytes Absolute: 4.2 10*3/uL — ABNORMAL HIGH (ref 0.9–3.6)
MCH: 32.6 PG (ref 24.0–34.0)
MCHC: 34.5 g/dL (ref 31.0–37.0)
MCV: 94.7 FL (ref 74.0–97.0)
MPV: 11.2 FL (ref 9.2–11.8)
Monocytes %: 6 % (ref 3–10)
Monocytes Absolute: 0.6 10*3/uL (ref 0.05–1.2)
Neutrophils %: 53 % (ref 40–73)
Neutrophils Absolute: 5.5 10*3/uL (ref 1.8–8.0)
Platelets: 215 10*3/uL (ref 135–420)
RBC: 4.72 M/uL (ref 4.20–5.30)
RDW: 12.9 % (ref 11.6–14.5)
WBC: 10.5 10*3/uL (ref 4.6–13.2)

## 2019-05-09 LAB — POCT GLUCOSE
POC Glucose: 77 mg/dL (ref 70–110)
POC Glucose: 191 mg/dL — ABNORMAL HIGH (ref 70–110)

## 2019-05-09 LAB — BASIC METABOLIC PANEL
Anion Gap: 4 mmol/L (ref 3.0–18)
BUN: 6 MG/DL — ABNORMAL LOW (ref 7.0–18)
Bun/Cre Ratio: 16 (ref 12–20)
CO2: 27 mmol/L (ref 21–32)
Calcium: 9.1 MG/DL (ref 8.5–10.1)
Chloride: 111 mmol/L (ref 100–111)
Creatinine: 0.38 MG/DL — ABNORMAL LOW (ref 0.6–1.3)
EGFR IF NonAfrican American: >60 mL/min/{1.73_m2} (ref >60)
GFR African American: >60 mL/min/{1.73_m2} (ref >60)
Glucose: 72 mg/dL — ABNORMAL LOW (ref 74–99)
Potassium: 4.7 mmol/L (ref 3.5–5.5)
Sodium: 142 mmol/L (ref 136–145)

## 2019-05-09 MED ORDER — DEXTROSE 10% BOLUS IV (NEONATE)
10 % | INTRAVENOUS | Status: AC
Start: 2019-05-09 — End: 2019-05-09
  Administered 2019-05-09: 22:00:00 via INTRAVENOUS

## 2019-05-09 MED ORDER — SODIUM CHLORIDE 0.9% BOLUS IV
0.9 % | Freq: Once | INTRAVENOUS | Status: AC
Start: 2019-05-09 — End: 2019-05-09
  Administered 2019-05-09: 20:00:00 via INTRAVENOUS

## 2019-05-09 MED ORDER — DEXTROSE 10% IN WATER (D10W) IV
10 % | Freq: Once | INTRAVENOUS | Status: DC
Start: 2019-05-09 — End: 2019-05-09

## 2019-05-09 MED ORDER — DEXTROSE 10% BOLUS IV (NEONATE)
10 % | Freq: Once | INTRAVENOUS | Status: AC
Start: 2019-05-09 — End: 2019-05-09

## 2019-05-09 MED ORDER — OXYCODONE-ACETAMINOPHEN 5 MG-325 MG TAB
5-325 mg | ORAL | Status: AC
Start: 2019-05-09 — End: 2019-05-09
  Administered 2019-05-09: 20:00:00 via ORAL

## 2019-05-09 MED ORDER — OXYCODONE-ACETAMINOPHEN 5 MG-325 MG TAB
5-325 mg | Freq: Once | ORAL | Status: AC
Start: 2019-05-09 — End: 2019-05-09
  Administered 2019-05-09: 17:00:00 via ORAL

## 2019-05-09 MED ORDER — IOPAMIDOL 76 % IV SOLN
370 mg iodine /mL (76 %) | Freq: Once | INTRAVENOUS | Status: AC
Start: 2019-05-09 — End: 2019-05-09
  Administered 2019-05-09: 20:00:00 via INTRAVENOUS

## 2019-05-09 MED FILL — OXYCODONE-ACETAMINOPHEN 5 MG-325 MG TAB: 5-325 mg | ORAL | Qty: 1

## 2019-05-09 MED FILL — ISOVUE-370  76 % INTRAVENOUS SOLUTION: 370 mg iodine /mL (76 %) | INTRAVENOUS | Qty: 100

## 2019-05-09 MED FILL — DEXTROSE 10% IN WATER (D10W) IV: 10 % | INTRAVENOUS | Qty: 250

## 2019-05-09 MED FILL — SODIUM CHLORIDE 0.9 % IV: INTRAVENOUS | Qty: 100

## 2019-05-09 NOTE — ED Notes (Signed)
Pt waiting. Requesting to go home and wait for results Ashlee NP spoke with pt and pt agreed to wait in ER. CT called

## 2019-05-09 NOTE — ED Notes (Signed)
Pt OOB to BR with a steady upright gait. Pt reports increasing neck pain and is requesting further medication. Ashlee PA aware . Plan to premedicate with percocet

## 2019-05-09 NOTE — ED Provider Notes (Signed)
ED Provider Notes by Meade Maw at 05/09/19 1229                Author: Meade Maw  Service: Emergency Medicine  Author Type: Physician Assistant       Filed: 05/09/19 1519  Date of Service: 05/09/19 1229  Status: Attested           Editor: Meade Maw  Cosigner: Mariane Masters, MD at 05/20/19 1137          Attestation signed by Mariane Masters, MD at 05/20/19 1137          11:37 AM   I was personally available for consultation in the emergency department.  I have reviewed the chart and agree with the documentation recorded by the APP, including the assessment, treatment plan, and disposition.   Mariane Masters, MD                                  EMERGENCY DEPARTMENT HISTORY AND PHYSICAL EXAM      12:29 PM         Date: 05/09/2019   Patient Name: Barbara [name]        History of Presenting Illness          Chief Complaint       Patient presents with        ?  Neck Pain        ?  Headache              History Provided By: Patient      Additional History (Context): Barbara [name]  is a 53 y.o. female with  PMHx as noted below who presents with complaints of headache x 3-4 days and complaints of chronic neck pain. The patient states that the headache began suddenly, and she has been taking Tylenol and  ibuprofen at home without relief. She denies any change in quality of her headache for the last 3-4 days. She states that she has aneurysms and was told that any time she had a headache that did not go away to come to the ED for evaluation. The patient  reports that she has chronic neck pain with radiculopathy that she follows with orthopedics for, and has an appointment in the coming week. She denies any change in quality of her neck pain, denies any new injury or trauma. She denies any other symptoms  at this time.       PCP: Toniann Ket, NP        Current Outpatient Medications          Medication  Sig  Dispense  Refill           ?  glucose blood VI test strips  (FreeStyle Lite Strips) strip  Use as directed to check blood sugar 4 times a day  100 Strip  1     ?  atorvastatin (LIPITOR) 10 mg tablet  Take 1 Tab by mouth daily.  90 Tab  1     ?  ramipriL (ALTACE) 10 mg capsule  Take 1 capsule by mouth once daily  90 Cap  1     ?  cholecalciferol (VITAMIN D3) (2,000 UNITS /50 MCG) cap capsule  Take 2,000 Units by mouth daily.  90 Cap  3           ?  flash glucose scanning reader (FreeStyle  Libre 14 Day Reader) misc  Used as directed to check blood sugar 4 times a day  1 Each  0           ?  flash glucose sensor (FreeStyle Libre 14 Day Sensor) kit  Use as directed to check blood sugar 4 times a day  1 Kit  5     ?  ALPRAZolam (Xanax) 0.5 mg tablet  Take 0.5 Tabs by mouth two (2) times daily as needed for Anxiety. Max Daily Amount: 0.5 mg.  30 Tab  2     ?  tiZANidine (ZANAFLEX) 2 mg tablet  Take 1 Tab by mouth three (3) times daily as needed for Muscle Spasm(s).  30 Tab  2     ?  diclofenac EC (VOLTAREN) 75 mg EC tablet  Take 1 Tab by mouth two (2) times a day.  30 Tab  3     ?  venlafaxine-SR (EFFEXOR-XR) 75 mg capsule  Take 3 Caps by mouth daily.  90 Cap  3     ?  albuterol (PROVENTIL HFA, VENTOLIN HFA, PROAIR HFA) 90 mcg/actuation inhaler  Take 2 Puffs by inhalation every six (6) hours as needed for Wheezing.  1 Inhaler  3     ?  insulin NPH (NOVOLIN N, HUMULIN N) 100 unit/mL injection  40 Units in the AM, 35 Units in the PM (Patient taking differently: 45 Units in the AM, 40 Units in the PM)  1 Vial  5     ?  ibuprofen (MOTRIN) 600 mg tablet  Take 1 Tab by mouth every six (6) hours as needed for Pain.  20 Tab  0     ?  gabapentin (NEURONTIN) 400 mg capsule  800 mg PO TID  270 Cap  3     ?  nystatin (MYCOSTATIN) powder  Apply to rash 4 times a day  60 g  3     ?  ketoconazole (NIZORAL) 2 % topical cream  Apply  to affected area two (2) times a day.  60 g  3           ?  letrozole (FEMARA) 2.5 mg tablet  Take 2.5 mg by mouth daily.                 Past History        Past  Medical History:     Past Medical History:        Diagnosis  Date         ?  Aneurysm ([place])  2020          right MCA 3-4 mm and left ICA carotid cave 3 mm         ?  Atherosclerosis  2020     ?  Back pain       ?  Breast cancer ([facility])  2008 or 2009     ?  Carotid artery stenosis            2020 Dr. Constance Goltz, plans to repeat US in 2 years          ?  Chronic pain       ?  DDD (degenerative disc disease), cervical            multilevel         ?  Diabetes ([place])       ?  Facet joint disease of cervical region       ?  FH: chemotherapy       ?  Hypertension       ?  Hypertensive retinopathy  2020     ?  Nausea & vomiting       ?  Neuropathy       ?  Peripheral vascular disease ([facility])  2020          [facility] Dr. Constance Goltz         ?  Radiation therapy complication  5176          NOT complication, just radiation         ?  Vertebral artery stenosis            2020, per Dr. Constance Goltz - mild           Past Surgical History:     Past Surgical History:         Procedure  Laterality  Date          ?  BIOPSY BREAST         ?  BIOPSY FINE NEEDLE         ?  HX BREAST AUGMENTATION  Right  2010     ?  HX BREAST AUGMENTATION  Left  09/17/2017          left breast reconstruction with exchange of tissue expander per breast prostesis and left breast tissue expander performed by [name],  MD at [facility]          ?  HX BREAST AUGMENTATION  Right  04/01/2018          removal of tissue expanders with placment of right permanent breast prostesis performed by [name], MD at [facility]          ?  HX BREAST RECONSTRUCTION  Right  2011          per pt - attempted latissimus dorsi flap, then saline implant          ?  HX BREAST RECONSTRUCTION  Bilateral  06/10/2017          right breast prosthesis and capsule removal and bilateral breast tissue  expander reconstruction.  performed by [name], MD at [facility]          ?  HX BREAST RECONSTRUCTION  Left  07/09/2017          left breast  EXCISION and closure of wound performed  by [name], MD at [facility]          ?  HX BREAST RECONSTRUCTION  Bilateral            Tissue expanders, to prepare for implants           ?  HX CESAREAN SECTION         ?  HX LUMBAR DISKECTOMY              herniated disc          ?  HX MASTECTOMY  Right  2010          Total mastectomy per pt.            ?  HX MASTECTOMY  Left  06/10/2017          left mastectomy,Sentinal node biopsies. performed by [name], MD at [facility]          ?  HX MASTECTOMY  Bilateral       ?  HX MASTOPEXY (BREAST LIFT)  Left  2011     ?  IMPLANT BREAST SILICONE/EQ  Right  1610          post mastectomy          ?  THUMB SUPPORT              thumb surgery to mobilize thumb           Family History:     Family History         Problem  Relation  Age of Onset          ?  Diabetes  Mother            ?  No Known Problems  Father             Social History:     Social History          Tobacco Use         ?  Smoking status:  Current Some Day Smoker              Packs/day:  0.50         Years:  15.00         Pack years:  7.50         Types:  Cigarettes         ?  Smokeless tobacco:  Never Used        ?  Tobacco comment: 5-6 cigs per day patient education given       Substance Use Topics         ?  Alcohol use:  No         ?  Drug use:  No           Allergies:     Allergies        Allergen  Reactions         ?  Tramadol  Anaphylaxis     ?  Metformin  Diarrhea         ?  Trulicity [Dulaglutide]  Nausea Only                Review of Systems           Review of Systems    Constitutional: Negative.     HENT: Negative.     Respiratory: Negative.     Cardiovascular: Negative.     Gastrointestinal: Negative.     Genitourinary: Negative.     Musculoskeletal: Positive for neck pain. Negative for back pain, myalgias and neck stiffness.    Skin: Negative.     Neurological: Positive for headaches. Negative for dizziness, tremors, seizures, syncope, facial asymmetry, speech difficulty, weakness,  light-headedness and numbness.    [name] other  systems reviewed and are negative.              Physical Exam        Visit Vitals      BP  129/78 (BP 1 Location: Left arm)     Pulse  65     Temp  98.2 ??F (36.8 ??C)     Resp  19     Ht  5' 2"  (1.575 m)     Wt  62.6 kg (138 lb)     SpO2  99%        BMI  25.24 kg/m??              Physical Exam   Vitals signs reviewed.  Constitutional:        General: She is not in acute distress.     Appearance: Normal appearance. She is not ill-appearing, toxic-appearing or diaphoretic.    HENT:       Head: Normocephalic and atraumatic.      Right Ear: External ear normal.      Left Ear: External ear normal.      Nose: Nose normal.      Mouth/Throat:      Mouth: Mucous membranes are moist.      Pharynx: Oropharynx is clear.   Eyes:       Extraocular Movements: Extraocular movements intact.   Neck :       Musculoskeletal: Full passive range of motion without pain, normal range of motion and neck supple. Normal range of motion. Muscular tenderness  present. No edema, neck rigidity, crepitus, pain with movement or spinous process tenderness.      Comments: Bilateral paraspinal muscle tenderness to palpation of the cervical spine. No  midline, bony, or point tenderness. No crepitus or step-off.    Cardiovascular:       Rate and Rhythm: Normal rate and regular rhythm.      Pulses: Normal pulses.      Heart sounds: Normal heart sounds. No murmur. No gallop.    Pulmonary:       Effort: Pulmonary effort is normal.      Breath sounds: Normal breath sounds. No wheezing, rhonchi or rales.    Abdominal:      General: Bowel sounds are normal. There is no distension.      Palpations: Abdomen is soft. There is no mass.      Tenderness: There is no abdominal tenderness. There  is no guarding or rebound.    Skin:      General: Skin is warm and dry.      Capillary Refill: Capillary refill takes less than 2 seconds.    Neurological:       General: No focal deficit present.      Mental Status: She is alert and oriented to person, place, and time.       Cranial Nerves: No cranial nerve deficit.      Sensory: No sensory deficit.      Motor: No weakness.      Gait: Gait  normal.                 Diagnostic Study Results        Labs -   No results found for this or any previous visit (from the past 12 hour(s)).      Radiologic Studies -      CT HEAD WO CONT    (Results Pending)                Medical Decision Making     I am the first provider for this patient.      I reviewed the vital signs, available nursing notes, past medical history, past surgical history, family history and social history.      Vital Signs-Reviewed the patient's vital signs.      Records Reviewed: Nursing Notes and Old Medical Records (Time of Review: 12:29 PM)      ED Course: Progress Notes, Reevaluation, and Consults:   12:29 PM  Met with patient, reviewed history, performed physical exam. Will check CT head given complaints of headache x 3-4 days with history of headache and no improvement of headache during that  time.  3:12 PM : Pt care transferred to Maylon Peppers, [place], ED provider. History of patient complaint(s), available diagnostic reports and current treatment plan has been discussed thoroughly.    Bedside rounding on patient occured : no .   Intended disposition of patient : Home   Pending diagnostics reports and/or labs (please list): CT Head w/o contrast      PA Ysidro Evert' assistance in completion of this plan is greatly appreciated but it should be noted that I will be the provider of record for this patient.         Provider Notes (Medical Decision Making):   53 year old female seen in the ED for complaints of headache and neck pain. Workup included CT of the head without contrast which is pending at this time. Patient's headache and neck pain improved after Percocet in the ED. Patient has outpatient follow  up with orthopedics scheduled in the coming days.    Patient's care signed over to [place] for remainder of time in the ED. Intending disposition is home if CT head result  is unremarkable for acute changes.    Patient advised to follow up with her PCP for reassessment. Patient advised to return to the ED if symptoms worsen, or as needed.           Diagnosis        Clinical Impression:       1.  Acute nonintractable headache, unspecified headache type      2.  Cervical radiculopathy         3.  Neck pain            Disposition: home         Follow-up Information               Follow up With  Specialties  Details  Why  Contact Info              Toniann Ket, NP  Nurse Practitioner  Call in 1 day  For follow up regarding ER visit.  [address]   [address]   [place] [address]   [phone]                 [facility]    Call in 1 day  For follow up regarding ER visit.  [place]              [facility] EMERGENCY DEPT  Emergency Medicine    Immediately if symptoms worsen, As needed.  [place]                   Patient's Medications       Start Taking          No medications on file       Continue Taking           ALBUTEROL (PROVENTIL HFA, VENTOLIN HFA, PROAIR HFA) 90 MCG/ACTUATION INHALER     Take 2 Puffs by inhalation every six (6) hours as needed for Wheezing.           ALPRAZOLAM (XANAX) 0.5 MG TABLET     Take 0.5 Tabs by mouth two (2) times daily as needed for Anxiety. Max Daily Amount: 0.5 mg.           ATORVASTATIN (LIPITOR) 10 MG TABLET  Take 1 Tab by mouth daily.           CHOLECALCIFEROL (VITAMIN D3) (2,000 UNITS /50 MCG) CAP CAPSULE     Take 2,000 Units by mouth daily.           DICLOFENAC EC (VOLTAREN) 75 MG EC TABLET     Take 1 Tab by mouth two (2) times a day.           FLASH GLUCOSE SCANNING READER (FREESTYLE LIBRE 14 DAY READER) MISC     Used as directed to check blood sugar 4 times a day           FLASH GLUCOSE SENSOR (FREESTYLE LIBRE 14 DAY SENSOR) KIT     Use as directed to check blood sugar 4 times a day           GABAPENTIN (NEURONTIN)  400 MG CAPSULE     800 mg PO TID           GLUCOSE BLOOD VI TEST STRIPS (FREESTYLE LITE STRIPS) STRIP     Use as directed to check blood sugar 4 times a day           IBUPROFEN (MOTRIN) 600 MG TABLET     Take 1 Tab by mouth every six (6) hours as needed for Pain.           INSULIN NPH (NOVOLIN N, HUMULIN N) 100 UNIT/ML INJECTION     40 Units in the AM, 35 Units in the PM           KETOCONAZOLE (NIZORAL) 2 % TOPICAL CREAM     Apply  to affected area two (2) times a day.           LETROZOLE (FEMARA) 2.5 MG TABLET     Take 2.5 mg by mouth daily.           NYSTATIN (MYCOSTATIN) POWDER     Apply to rash 4 times a day           RAMIPRIL (ALTACE) 10 MG CAPSULE     Take 1 capsule by mouth once daily           TIZANIDINE (ZANAFLEX) 2 MG TABLET     Take 1 Tab by mouth three (3) times daily as needed for Muscle Spasm(s).           VENLAFAXINE-SR (EFFEXOR-XR) 75 MG CAPSULE     Take 3 Caps by mouth daily.       These Medications have changed          No medications on file       Stop Taking          No medications on file        Meade Maw, PA-C      Dictation disclaimer:  Please note that this dictation was completed with Dragon, the computer voice recognition software.  Quite often unanticipated grammatical, syntax, homophones, and other  interpretive errors are inadvertently transcribed by the computer software.  Please disregard these errors.  Please excuse any errors that have escaped final proofreading.

## 2019-05-09 NOTE — ED Notes (Signed)
Pt has bilateral mastectomies and chemo in past. Use left lower arm for BP. 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

## 2019-05-09 NOTE — ED Notes (Signed)
Pt to CT via w/c escorted by CT tech

## 2019-05-09 NOTE — ED Notes (Signed)
Pt to CT

## 2019-05-09 NOTE — ED Notes (Signed)
Pt called RN to bedside stating she feels that her BS is low. FSBS 77. ER provider aware . Plan to order  D10

## 2019-05-09 NOTE — ED Notes (Signed)
D10 infused. IV flushed with ease and site remains asymptomatic. Repeat FSBS 191. Pt reports she feels much better

## 2019-05-09 NOTE — ED Notes (Signed)
 Pt resting quietly, sleeping awakes easily with verbal stimuli. PT AOX4 Skin normal color warm and dry. Resp regular unlabored. Pt reports pain decreased to 5/10  Its much better Pt refused offer for further medication atthis time

## 2019-05-09 NOTE — ED Notes (Signed)
Caryl Pina NP called CT again to track results

## 2019-05-09 NOTE — ED Notes (Signed)
Pt continues to wait fro CT results. Apologies provided and CT has been called. Pt calm and co-operative

## 2019-05-09 NOTE — ED Notes (Signed)
Pt with chronic neck pain . States has ortho appointment soon. Hx herniated disc

## 2019-05-09 NOTE — ED Notes (Signed)
ED Notes  by Skeet Simmer, PA at 05/09/19 2205                Author: Skeet Simmer, PA  Service: EMERGENCY  Author Type: Physician Assistant       Filed: 05/09/19 2211  Date of Service: 05/09/19 2205  Status: Attested           Editor: Skeet Simmer, PA (Physician Assistant)  Cosigner: Argie Ramming, MD at 05/09/19 2226          Attestation signed by Argie Ramming, MD at 05/09/19 2226          Vitals:   Patient Vitals for the past 12 hrs:     Temp  Pulse  Resp  BP  SpO2      05/09/19 2136  98.3 ??F (36.8 ??C)  79  19  156/89  96 %      05/09/19 2035  --  89  19  132/78  96 %      05/09/19 1824  --  93  19  109/59  96 %      05/09/19 1554  98.3 ??F (36.8 ??C)  82  16  162/81  99 %      05/09/19 1332  98.2 ??F (36.8 ??C)  65  19  129/78  99 %      05/09/19 1223  98 ??F (36.7 ??C)  69  16  (!) 172/97  100 %                  Medications ordered:    Medications      oxyCODONE-acetaminophen (PERCOCET) 5-325 mg per tablet 1 Tab (1 Tab Oral Given 05/09/19 1244)      iopamidoL (ISOVUE-370) 76 % injection 100 mL (80 mL IntraVENous Given 05/09/19 1618)      sodium chloride 0.9 % bolus infusion 100 mL (0 mL IntraVENous IV Completed 05/09/19 1810)      oxyCODONE-acetaminophen (PERCOCET) 5-325 mg per tablet 1 Tab (1 Tab Oral Given 05/09/19 1607)      dextrose 10 % infusion 250 mL (0 mL IntraVENous IV Completed 05/09/19 1906)                 Lab findings:   Recent Results (from the past 12 hour(s))      CBC WITH AUTOMATED DIFF        Collection Time: 05/09/19  3:41 PM      Result  Value  Ref Range        WBC  10.5  4.6 - 13.2 K/uL        RBC  4.72  4.20 - 5.30 M/uL        HGB  15.4  12.0 - 16.0 g/dL        HCT  44.7  35.0 - 45.0 %        MCV  94.7  74.0 - 97.0 FL        MCH  32.6  24.0 - 34.0 PG        MCHC  34.5  31.0 - 37.0 g/dL        RDW  12.9  11.6 - 14.5 %        PLATELET  215  135 - 420 K/uL        MPV  11.2  9.2 - 11.8 FL        NEUTROPHILS  53  40 - 73 %  LYMPHOCYTES  40  21 - 52 %        MONOCYTES  6   3 - 10 %        EOSINOPHILS  1  0 - 5 %        BASOPHILS  0  0 - 2 %        ABS. NEUTROPHILS  5.5  1.8 - 8.0 K/UL        ABS. LYMPHOCYTES  4.2 (H)  0.9 - 3.6 K/UL        ABS. MONOCYTES  0.6  0.05 - 1.2 K/UL        ABS. EOSINOPHILS  0.2  0.0 - 0.4 K/UL        ABS. BASOPHILS  0.0  0.0 - 0.1 K/UL        DF  AUTOMATED         METABOLIC PANEL, BASIC        Collection Time: 05/09/19  3:41 PM      Result  Value  Ref Range        Sodium  142  136 - 145 mmol/L        Potassium  4.7  3.5 - 5.5 mmol/L        Chloride  111  100 - 111 mmol/L        CO2  27  21 - 32 mmol/L        Anion gap  4  3.0 - 18 mmol/L        Glucose  72 (L)  74 - 99 mg/dL        BUN  6 (L)  7.0 - 18 MG/DL        Creatinine  0.38 (L)  0.6 - 1.3 MG/DL        BUN/Creatinine ratio  16  12 - 20          GFR est AA  >60  >60 ml/min/1.70m        GFR est non-AA  >60  >60 ml/min/1.754m       Calcium  9.1  8.5 - 10.1 MG/DL      GLUCOSE, POC        Collection Time: 05/09/19  6:14 PM      Result  Value  Ref Range        Glucose (POC)  77  70 - 110 mg/dL      GLUCOSE, POC        Collection Time: 05/09/19  7:03 PM      Result  Value  Ref Range        Glucose (POC)  191 (H)  70 - 110 mg/dL               EKG interpretation by ED Physician:         X-Ray, CT or other radiology findings or impressions:   CT HEAD WO CONT      Final Result      IMPRESSION:            No acute intracranial abnormality.            CTA HEAD    (Results Pending)               Progress notes, Consult notes or additional Procedure notes:    Turned over to me by AsCaryl Pinao follow-up on CT results.  Patient has 4 mm right MCA aneurysm which appears unruptured with no stenosis or occlusion.  This is similar to her previous aneurysm.   I have also seen the patient directly and examined her.  Patient's real pain is really in the left side of her neck which is essentially chronic.  She really has no particular worsening headache per my history and examination.  She has no new neurologic  symptoms.   Suspect this is more related to her neck and chronic pain then ruptured aneurysm and does not need or require additional work-up here to include lumbar puncture or emergent consult or transfer      I have discussed with patient and/or family/sig other the results, interpretation of any imaging if performed, suspected diagnosis and treatment plan to include instructions regarding the diagnoses  listed to which understanding was expressed with all questions answered         Reevaluation of patient:    stable      Disposition:   Diagnosis:    1.  Acute nonintractable headache, unspecified headache type       2.  Cervical radiculopathy       3.  Neck pain                Disposition: home      Follow-up Information               Follow up With  Specialties  Details  Why  Contact Info        Toniann Ket, NP  Nurse Practitioner  Call in 1 day  For follow up regarding ER visit.  [address]   [address]   [place] [address]   [phone]           [facility]    Call in 1 day  For follow up regarding ER visit.  [place]        [facility] EMERGENCY DEPT  Emergency Medicine    Immediately if symptoms worsen, As needed.  [place]                             Patient's Medications      Start Taking        No medications on file      Continue Taking        ALBUTEROL (PROVENTIL HFA, VENTOLIN HFA, PROAIR HFA) 90 MCG/ACTUATION INHALER     Take 2 Puffs by inhalation every six (6) hours as needed for Wheezing.        ALPRAZOLAM (XANAX) 0.5 MG TABLET     Take 0.5 Tabs by mouth two (2) times daily as needed for Anxiety. Max Daily Amount: 0.5 mg.        ATORVASTATIN (LIPITOR) 10 MG TABLET     Take 1 Tab by mouth daily.        CHOLECALCIFEROL (VITAMIN D3) (2,000 UNITS /50 MCG) CAP CAPSULE     Take 2,000 Units by mouth daily.        DICLOFENAC EC (VOLTAREN) 75 MG EC TABLET     Take 1 Tab by mouth two (2) times  a day.        FLASH GLUCOSE SCANNING READER (FREESTYLE LIBRE 14 DAY READER) MISC     Used as directed to check blood sugar 4 times a day        FLASH GLUCOSE  SENSOR (FREESTYLE LIBRE 14 DAY SENSOR) KIT     Use as directed to check blood sugar 4 times a day        GABAPENTIN (NEURONTIN) 400 MG CAPSULE     800 mg PO TID        GLUCOSE BLOOD VI TEST STRIPS (FREESTYLE LITE STRIPS) STRIP     Use as directed to check blood sugar 4 times a day        IBUPROFEN (MOTRIN) 600 MG TABLET     Take 1 Tab by mouth every six (6) hours as needed for Pain.        INSULIN NPH (NOVOLIN N, HUMULIN N) 100 UNIT/ML INJECTION     40 Units in the AM, 35 Units in the PM        KETOCONAZOLE (NIZORAL) 2 % TOPICAL CREAM     Apply  to affected area two (2) times a day.        LETROZOLE (FEMARA) 2.5 MG TABLET     Take 2.5 mg by mouth daily.        NYSTATIN (MYCOSTATIN) POWDER     Apply to rash 4 times a day        RAMIPRIL (ALTACE) 10 MG CAPSULE     Take 1 capsule by mouth once daily        TIZANIDINE (ZANAFLEX) 2 MG TABLET     Take 1 Tab by mouth three (3) times daily as needed for Muscle Spasm(s).        VENLAFAXINE-SR (EFFEXOR-XR) 75 MG CAPSULE     Take 3 Caps by mouth daily.      These Medications have changed        No medications on file      Stop Taking        No medications on file                                             3:00 PM I have taken over care of the patient from [place], [place].      CT head noncontrast read resulted without acute process.      I discussed this patient with my attending, Dr. Lovena Le, who agrees with plan for CTA head. Patient aware and agreeable.       CTA was performed and it was taking a while to get the result.  I called over and spoke with the [place], who states that it was sent over by the prior tech and will be read by stat rad but takes quite some time.      8:36 PM I called over again to CT asking for results or if it had been lost somehow, he states he was just taking a long time.      8:50 PM  Aura called over and indicated that it had never been sent but that he would send it.  He also provided me with the number for [facility] radiologist to see if they would want to read it for me stat.      9:40 PM Aura came to the ED and asked if I gotten the read yet.  I told him I had not and he said he would send it to stat rad.  Informed him that I thought he had already sent as you told me that.  He states  he will send it now.      10:09 PM : Pt care transferred to Dr. Chrisandra Carota, ED provider. History of patient complaint(s), available diagnostic reports and current treatment plan has been discussed thoroughly.    Intended disposition of patient : TBD   Pending diagnostics reports and/or labs (please list): CTA head       Skeet Simmer, PA

## 2019-05-09 NOTE — ED Notes (Signed)
Pt finally for discharge. Lunch box provided. Pt requesting further medication before discharge. ERMD aware and will provide an order. Discharge medications reviewed with patient and appropriate educational materials and side effects teaching were provided. I have reviewed discharge instructions with the patient.  The patient verbalized understanding.

## 2019-05-09 NOTE — ED Notes (Signed)
Pt resting quietly, sleeping awakes easily with verbal stimuli. PT AOX4 Skin normal color warm and dry. Resp regular unlabored. Pt reports pain decreased to 5/10 " Its much better" Pt refused offer for further medication atthis time

## 2019-05-09 NOTE — ED Provider Notes (Signed)
EMERGENCY DEPARTMENT HISTORY AND PHYSICAL EXAM    12:29 PM      Date: 05/09/2019  Patient Name: Barbara [name]    History of Presenting Illness     Chief Complaint   Patient presents with   ??? Neck Pain   ??? Headache         History Provided By: Patient    Additional History (Context): Barbara [name] is a 53 y.o. female with PMHx as noted below who presents with complaints of headache x 3-4 days and complaints of chronic neck pain. The patient states that the headache began suddenly, and she has been taking Tylenol and ibuprofen at home without relief. She denies any change in quality of her headache for the last 3-4 days. She states that she has aneurysms and was told that any time she had a headache that did not go away to come to the ED for evaluation. The patient reports that she has chronic neck pain with radiculopathy that she follows with orthopedics for, and has an appointment in the coming week. She denies any change in quality of her neck pain, denies any new injury or trauma. She denies any other symptoms at this time.     PCP: Toniann Ket, NP    Current Outpatient Medications   Medication Sig Dispense Refill   ??? glucose blood VI test strips (FreeStyle Lite Strips) strip Use as directed to check blood sugar 4 times a day 100 Strip 1   ??? atorvastatin (LIPITOR) 10 mg tablet Take 1 Tab by mouth daily. 90 Tab 1   ??? ramipriL (ALTACE) 10 mg capsule Take 1 capsule by mouth once daily 90 Cap 1   ??? cholecalciferol (VITAMIN D3) (2,000 UNITS /50 MCG) cap capsule Take 2,000 Units by mouth daily. 90 Cap 3   ??? flash glucose scanning reader (FreeStyle Libre 14 Day Reader) misc Used as directed to check blood sugar 4 times a day 1 Each 0   ??? flash glucose sensor (FreeStyle Libre 14 Day Sensor) kit Use as directed to check blood sugar 4 times a day 1 Kit 5   ??? ALPRAZolam (Xanax) 0.5 mg tablet Take 0.5 Tabs by mouth two (2) times daily as needed for Anxiety. Max Daily Amount: 0.5 mg. 30 Tab 2    ??? tiZANidine (ZANAFLEX) 2 mg tablet Take 1 Tab by mouth three (3) times daily as needed for Muscle Spasm(s). 30 Tab 2   ??? diclofenac EC (VOLTAREN) 75 mg EC tablet Take 1 Tab by mouth two (2) times a day. 30 Tab 3   ??? venlafaxine-SR (EFFEXOR-XR) 75 mg capsule Take 3 Caps by mouth daily. 90 Cap 3   ??? albuterol (PROVENTIL HFA, VENTOLIN HFA, PROAIR HFA) 90 mcg/actuation inhaler Take 2 Puffs by inhalation every six (6) hours as needed for Wheezing. 1 Inhaler 3   ??? insulin NPH (NOVOLIN N, HUMULIN N) 100 unit/mL injection 40 Units in the AM, 35 Units in the PM (Patient taking differently: 45 Units in the AM, 40 Units in the PM) 1 Vial 5   ??? ibuprofen (MOTRIN) 600 mg tablet Take 1 Tab by mouth every six (6) hours as needed for Pain. 20 Tab 0   ??? gabapentin (NEURONTIN) 400 mg capsule 800 mg PO TID 270 Cap 3   ??? nystatin (MYCOSTATIN) powder Apply to rash 4 times a day 60 g 3   ??? ketoconazole (NIZORAL) 2 % topical cream Apply  to affected area two (2) times a day.  60 g 3   ??? letrozole (FEMARA) 2.5 mg tablet Take 2.5 mg by mouth daily.         Past History     Past Medical History:  Past Medical History:   Diagnosis Date   ??? Aneurysm ([place]) 2020    right MCA 3-4 mm and left ICA carotid cave 3 mm   ??? Atherosclerosis 2020   ??? Back pain    ??? Breast cancer ([place]) 2008 or 2009   ??? Carotid artery stenosis     2020 Dr. Constance Goltz, plans to repeat US in 2 years    ??? Chronic pain    ??? DDD (degenerative disc disease), cervical     multilevel   ??? Diabetes ([place])    ??? Facet joint disease of cervical region    ??? FH: chemotherapy    ??? Hypertension    ??? Hypertensive retinopathy 2020   ??? Nausea & vomiting    ??? Neuropathy    ??? Peripheral vascular disease ([place]) 2020    LLE Dr. Constance Goltz   ??? Radiation therapy complication 9509    NOT complication, just radiation   ??? Vertebral artery stenosis     2020, per Dr. Constance Goltz - mild       Past Surgical History:  Past Surgical History:   Procedure Laterality Date   ??? BIOPSY BREAST     ??? BIOPSY FINE NEEDLE      ??? HX BREAST AUGMENTATION Right 2010   ??? HX BREAST AUGMENTATION Left 09/17/2017    left breast reconstruction with exchange of tissue expander per breast prostesis and left breast tissue expander performed by [name], MD at [place]   ??? HX BREAST AUGMENTATION Right 04/01/2018    removal of tissue expanders with placment of right permanent breast prostesis performed by [name], MD at [place]   ??? HX BREAST RECONSTRUCTION Right 2011    per pt - attempted latissimus dorsi flap, then saline implant   ??? HX BREAST RECONSTRUCTION Bilateral 06/10/2017    right breast prosthesis and capsule removal and bilateral breast tissue  expander reconstruction.  performed by [name], MD at [place]   ??? HX BREAST RECONSTRUCTION Left 07/09/2017    left breast  EXCISION and closure of wound performed by [name], MD at [place]   ??? HX BREAST RECONSTRUCTION Bilateral     Tissue expanders, to prepare for implants    ??? HX CESAREAN SECTION     ??? HX LUMBAR DISKECTOMY      herniated disc   ??? HX MASTECTOMY Right 2010    Total mastectomy per pt.     ??? HX MASTECTOMY Left 06/10/2017    left mastectomy,Sentinal node biopsies. performed by [name], MD at [facility] MAIN OR   ??? HX MASTECTOMY Bilateral    ??? HX MASTOPEXY (BREAST LIFT) Left 2011   ??? IMPLANT BREAST SILICONE/EQ Right 3267    post mastectomy   ??? THUMB SUPPORT      thumb surgery to mobilize thumb       Family History:  Family History   Problem Relation Age of Onset   ??? Diabetes Mother    ??? No Known Problems Father        Social History:  Social History     Tobacco Use   ??? Smoking status: Current Some Day Smoker     Packs/day: 0.50     Years: 15.00  Pack years: 7.50     Types: Cigarettes   ??? Smokeless tobacco: Never Used   ??? Tobacco comment: 5-6 cigs per day patient education given   Substance Use Topics   ??? Alcohol use: No   ??? Drug use: No       Allergies:  Allergies   Allergen Reactions   ??? Tramadol Anaphylaxis   ??? Metformin Diarrhea    ??? Trulicity [Dulaglutide] Nausea Only         Review of Systems       Review of Systems   Constitutional: Negative.    HENT: Negative.    Respiratory: Negative.    Cardiovascular: Negative.    Gastrointestinal: Negative.    Genitourinary: Negative.    Musculoskeletal: Positive for neck pain. Negative for back pain, myalgias and neck stiffness.   Skin: Negative.    Neurological: Positive for headaches. Negative for dizziness, tremors, seizures, syncope, facial asymmetry, speech difficulty, weakness, light-headedness and numbness.   [name] other systems reviewed and are negative.        Physical Exam     Visit Vitals  BP 129/78 (BP 1 Location: Left arm)   Pulse 65   Temp 98.2 ??F (36.8 ??C)   Resp 19   Ht 5' 2" (1.575 m)   Wt 62.6 kg (138 lb)   SpO2 99%   BMI 25.24 kg/m??         Physical Exam  Vitals signs reviewed.   Constitutional:       General: She is not in acute distress.     Appearance: Normal appearance. She is not ill-appearing, toxic-appearing or diaphoretic.   HENT:      Head: Normocephalic and atraumatic.      Right Ear: External ear normal.      Left Ear: External ear normal.      Nose: Nose normal.      Mouth/Throat:      Mouth: Mucous membranes are moist.      Pharynx: Oropharynx is clear.   Eyes:      Extraocular Movements: Extraocular movements intact.   Neck:      Musculoskeletal: Full passive range of motion without pain, normal range of motion and neck supple. Normal range of motion. Muscular tenderness present. No edema, neck rigidity, crepitus, pain with movement or spinous process tenderness.      Comments: Bilateral paraspinal muscle tenderness to palpation of the cervical spine. No midline, bony, or point tenderness. No crepitus or step-off.   Cardiovascular:      Rate and Rhythm: Normal rate and regular rhythm.      Pulses: Normal pulses.      Heart sounds: Normal heart sounds. No murmur. No gallop.    Pulmonary:      Effort: Pulmonary effort is normal.       Breath sounds: Normal breath sounds. No wheezing, rhonchi or rales.   Abdominal:      General: Bowel sounds are normal. There is no distension.      Palpations: Abdomen is soft. There is no mass.      Tenderness: There is no abdominal tenderness. There is no guarding or rebound.   Skin:     General: Skin is warm and dry.      Capillary Refill: Capillary refill takes less than 2 seconds.   Neurological:      General: No focal deficit present.      Mental Status: She is alert and oriented to person, place, and time.  Cranial Nerves: No cranial nerve deficit.      Sensory: No sensory deficit.      Motor: No weakness.      Gait: Gait normal.           Diagnostic Study Results     Labs -  No results found for this or any previous visit (from the past 12 hour(s)).    Radiologic Studies -   CT HEAD WO CONT    (Results Pending)         Medical Decision Making   I am the first provider for this patient.    I reviewed the vital signs, available nursing notes, past medical history, past surgical history, family history and social history.    Vital Signs-Reviewed the patient's vital signs.    Records Reviewed: Nursing Notes and Old Medical Records (Time of Review: 12:29 PM)    ED Course: Progress Notes, Reevaluation, and Consults:  12:29 PM  Met with patient, reviewed history, performed physical exam. Will check CT head given complaints of headache x 3-4 days with history of headache and no improvement of headache during that time.     3:12 PM : Pt care transferred to Maylon Peppers, [place], ED provider. History of patient complaint(s), available diagnostic reports and current treatment plan has been discussed thoroughly.   Bedside rounding on patient occured : no .  Intended disposition of patient : Home  Pending diagnostics reports and/or labs (please list): CT Head w/o contrast    PA Ysidro Evert' assistance in completion of this plan is greatly appreciated but it should be noted that I will be the provider of record for this  patient.      Provider Notes (Medical Decision Making):  53 year old female seen in the ED for complaints of headache and neck pain. Workup included CT of the head without contrast which is pending at this time. Patient's headache and neck pain improved after Percocet in the ED. Patient has outpatient follow up with orthopedics scheduled in the coming days.   Patient's care signed over to [place] for remainder of time in the ED. Intending disposition is home if CT head result is unremarkable for acute changes.   Patient advised to follow up with her PCP for reassessment. Patient advised to return to the ED if symptoms worsen, or as needed.      Diagnosis     Clinical Impression:   1. Acute nonintractable headache, unspecified headache type    2. Cervical radiculopathy    3. Neck pain        Disposition: home     Follow-up Information     Follow up With Specialties Details Why Contact Info    Toniann Ket, NP Nurse Practitioner Call in 1 day For follow up regarding ER visit. [address]  [address]  [place] [address]  [phone]      [facility]  Call in 1 day For follow up regarding ER visit. [place]    [facility] EMERGENCY DEPT Emergency Medicine  Immediately if symptoms worsen, As needed. [place]  [phone]           Patient's Medications   Start Taking    No medications on file   Continue Taking    ALBUTEROL (PROVENTIL HFA, VENTOLIN HFA, PROAIR HFA) 90 MCG/ACTUATION INHALER    Take 2 Puffs by inhalation every six (  6) hours as needed for Wheezing.    ALPRAZOLAM (XANAX) 0.5 MG TABLET    Take 0.5 Tabs by mouth two (2) times daily as needed for Anxiety. Max Daily Amount: 0.5 mg.    ATORVASTATIN (LIPITOR) 10 MG TABLET    Take 1 Tab by mouth daily.    CHOLECALCIFEROL (VITAMIN D3) (2,000 UNITS /50 MCG) CAP CAPSULE    Take 2,000 Units by mouth daily.     DICLOFENAC EC (VOLTAREN) 75 MG EC TABLET    Take 1 Tab by mouth two (2) times a day.    FLASH GLUCOSE SCANNING READER (FREESTYLE LIBRE 14 DAY READER) MISC    Used as directed to check blood sugar 4 times a day    FLASH GLUCOSE SENSOR (FREESTYLE LIBRE 14 DAY SENSOR) KIT    Use as directed to check blood sugar 4 times a day    GABAPENTIN (NEURONTIN) 400 MG CAPSULE    800 mg PO TID    GLUCOSE BLOOD VI TEST STRIPS (FREESTYLE LITE STRIPS) STRIP    Use as directed to check blood sugar 4 times a day    IBUPROFEN (MOTRIN) 600 MG TABLET    Take 1 Tab by mouth every six (6) hours as needed for Pain.    INSULIN NPH (NOVOLIN N, HUMULIN N) 100 UNIT/ML INJECTION    40 Units in the AM, 35 Units in the PM    KETOCONAZOLE (NIZORAL) 2 % TOPICAL CREAM    Apply  to affected area two (2) times a day.    LETROZOLE (FEMARA) 2.5 MG TABLET    Take 2.5 mg by mouth daily.    NYSTATIN (MYCOSTATIN) POWDER    Apply to rash 4 times a day    RAMIPRIL (ALTACE) 10 MG CAPSULE    Take 1 capsule by mouth once daily    TIZANIDINE (ZANAFLEX) 2 MG TABLET    Take 1 Tab by mouth three (3) times daily as needed for Muscle Spasm(s).    VENLAFAXINE-SR (EFFEXOR-XR) 75 MG CAPSULE    Take 3 Caps by mouth daily.   These Medications have changed    No medications on file   Stop Taking    No medications on file     Meade Maw, PA-C    Dictation disclaimer:  Please note that this dictation was completed with Dragon, the computer voice recognition software.  Quite often unanticipated grammatical, syntax, homophones, and other interpretive errors are inadvertently transcribed by the computer software.  Please disregard these errors.  Please excuse any errors that have escaped final proofreading.

## 2019-05-09 NOTE — ED Notes (Signed)
3:00 PM I have taken over care of the patient from [place], [place].    CT head noncontrast read resulted without acute process.    I discussed this patient with my attending, Dr. Lovena Le, who agrees with plan for CTA head. Patient aware and agreeable.     CTA was performed and it was taking a while to get the result.  I called over and spoke with the [facility], who states that it was sent over by the prior tech and will be read by stat rad but takes quite some time.    8:36 PM I called over again to CT asking for results or if it had been lost somehow, he states he was just taking a long time.    8:50 PM Aura called over and indicated that it had never been sent but that he would send it.  He also provided me with the number for [facility] radiologist to see if they would want to read it for me stat.    9:40 PM Aura came to the ED and asked if I gotten the read yet.  I told him I had not and he said he would send it to stat rad.  Informed him that I thought he had already sent as you told me that.  He states he will send it now.    10:09 PM : Pt care transferred to Dr. Chrisandra Carota, ED provider. History of patient complaint(s), available diagnostic reports and current treatment plan has been discussed thoroughly.   Intended disposition of patient : TBD  Pending diagnostics reports and/or labs (please list): CTA head     Skeet Simmer, PA

## 2019-05-09 NOTE — ED Triage Notes (Addendum)
Pt has bilateral mastectomies and chemo in past. Use left lower arm for BP. 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

## 2019-05-09 NOTE — ED Triage Notes (Signed)
Pt with chronic neck pain . States has ortho appointment soon. Hx herniated disc

## 2019-05-10 MED ORDER — HYDROCODONE-ACETAMINOPHEN 5 MG-325 MG TAB
5-325 mg | ORAL_TABLET | Freq: Four times a day (QID) | ORAL | 0 refills | Status: AC | PRN
Start: 2019-05-10 — End: 2019-05-14

## 2019-05-10 MED ORDER — TIZANIDINE 2 MG TAB
2 mg | ORAL_TABLET | Freq: Three times a day (TID) | ORAL | 2 refills | Status: DC | PRN
Start: 2019-05-10 — End: 2019-09-15

## 2019-05-10 MED ORDER — OXYCODONE-ACETAMINOPHEN 5 MG-325 MG TAB
5-325 mg | ORAL | Status: AC
Start: 2019-05-10 — End: 2019-05-09
  Administered 2019-05-10: 03:00:00 via ORAL

## 2019-05-10 MED FILL — OXYCODONE-ACETAMINOPHEN 5 MG-325 MG TAB: 5-325 mg | ORAL | Qty: 1

## 2019-05-12 ENCOUNTER — Telehealth: Attending: Nurse Practitioner | Primary: Nurse Practitioner

## 2019-05-12 ENCOUNTER — Telehealth
Admit: 2019-05-12 | Discharge: 2019-05-12 | Payer: PRIVATE HEALTH INSURANCE | Attending: Nurse Practitioner | Primary: Nurse Practitioner

## 2019-05-12 DIAGNOSIS — E119 Type 2 diabetes mellitus without complications: Secondary | ICD-10-CM

## 2019-05-12 NOTE — Progress Notes (Signed)
Barbara [name] was seen by synchronous (real-time) audio-video technology DOXY on 05/12/2019.      Consent: [name], who was seen by synchronous (real-time) audio-video technology, and/or her healthcare decision maker, is aware that this patient-initiated, Telehealth encounter on 05/12/2019 is a billable service, with coverage as determined by her insurance carrier. She is aware that she may receive a bill and has provided verbal consent to proceed: yes  712  Subjective:     HPI:  Barbara [name] is a 53 y.o. female who was seen for the following:     Upcoming surgery on 8/10 to remove nodules from right hand. Dr. Mady Gemma. She requests insulin dosing instructions.     Her blood sugar for the last couple weeks ranges from 77 to 301.     When it was 77 she had skipped a meal. When it was 301 she had eaten a piece of cake.     Starting PT for her neck. Saw the spinal specialist and will be following up with the MRI disk.     States things at home are getting better. Her daughter is now helping her.       Review of Systems   Constitutional: Negative for appetite change, diaphoresis, fatigue and unexpected weight change.   Eyes: Negative for visual disturbance.   Respiratory: Negative for cough, chest tightness and shortness of breath.    Cardiovascular: Negative for chest pain, palpitations and leg swelling.   Gastrointestinal: Negative for abdominal distention, abdominal pain, blood in stool, constipation, diarrhea, nausea, rectal pain and vomiting.   Endocrine: Negative for polydipsia, polyphagia and polyuria.   Genitourinary: Negative for decreased urine volume, dysuria and frequency.   Musculoskeletal: Positive for neck pain (chronic). Negative for joint swelling and myalgias.   Skin: Negative for rash and wound.   Neurological: Negative for dizziness, weakness, light-headedness, numbness and headaches.   Psychiatric/Behavioral: Negative for dysphoric mood and sleep disturbance. The patient is not  nervous/anxious.         Past Medical History, Past Surgery History, Allergies, Social History, and Family History were reviewed and updated.      Patient Active Problem List   Diagnosis Code   ??? Essential hypertension I10   ??? 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 Pain of left hip joint M25.552   ??? Low back pain M54.5   ??? Smoker F17.200   ??? Dupuytren contracture M72.0   ??? S/P mastectomy, left Z90.12   ??? Cigarette nicotine dependence without complication T55.732   ??? Recurrent depression (HCC) F33.9   ??? History of breast cancer Z85.3   ??? Hypertensive retinopathy H35.039   ??? Aneurysm (HCC) I72.9   ??? 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 DDD (degenerative disc disease), cervical M50.30   ??? Peripheral vascular disease (HCC) I73.9   ??? Carotid artery stenosis I65.29   ??? Vertebral artery stenosis I65.09     Past Medical History:   Diagnosis Date   ??? Aneurysm ([place]) 2020    right MCA 3-4 mm and left ICA carotid cave 3 mm   ??? Atherosclerosis 2020   ??? Back pain    ??? Breast cancer ([place]) 2008 or 2009   ??? Carotid artery stenosis     2020 Dr. Constance Goltz, plans to repeat US in 2 years    ??? Chronic pain    ??? DDD (degenerative disc disease), cervical  multilevel   ??? Diabetes ([place])    ??? Facet joint disease of cervical region    ??? FH: chemotherapy    ??? Hypertension    ??? Hypertensive retinopathy 2020   ??? Nausea & vomiting    ??? Neuropathy    ??? Peripheral vascular disease ([place]) 2020    LLE Dr. Constance Goltz   ??? Radiation therapy complication 2440    NOT complication, just radiation   ??? Vertebral artery stenosis     2020, per Dr. Constance Goltz - mild     Patient Care Team:  Toniann Ket, NP as PCP - General (Nurse Practitioner)  Isaiah Blakes Kerrin Champagne, NP as PCP - [facility] Empaneled Provider  Anastasio Auerbach., RN as Nurse Navigator (Oncology)  Reita Cliche, MD (Breast Surgery)  Lilian Coma, MD as Physician (Physical Medicine and Rehabilitation)  Constance Goltz  Brynda Peon, MD (Vascular Surgery)    Past Surgical History:   Procedure Laterality Date   ??? BIOPSY BREAST     ??? BIOPSY FINE NEEDLE     ??? HX BREAST AUGMENTATION Right 2010   ??? HX BREAST AUGMENTATION Left 09/17/2017    left breast reconstruction with exchange of tissue expander per breast prostesis and left breast tissue expander performed by [name], MD at [place]   ??? HX BREAST AUGMENTATION Right 04/01/2018    removal of tissue expanders with placment of right permanent breast prostesis performed by [name], MD at [place]   ??? HX BREAST RECONSTRUCTION Right 2011    per pt - attempted latissimus dorsi flap, then saline implant   ??? HX BREAST RECONSTRUCTION Bilateral 06/10/2017    right breast prosthesis and capsule removal and bilateral breast tissue  expander reconstruction.  performed by [name], MD at [place]   ??? HX BREAST RECONSTRUCTION Left 07/09/2017    left breast  EXCISION and closure of wound performed by [name], MD at [place]   ??? HX BREAST RECONSTRUCTION Bilateral     Tissue expanders, to prepare for implants    ??? HX CESAREAN SECTION     ??? HX LUMBAR DISKECTOMY      herniated disc   ??? HX MASTECTOMY Right 2010    Total mastectomy per pt.     ??? HX MASTECTOMY Left 06/10/2017    left mastectomy,Sentinal node biopsies. performed by [name], MD at [facility] MAIN OR   ??? HX MASTECTOMY Bilateral    ??? HX MASTOPEXY (BREAST LIFT) Left 2011   ??? IMPLANT BREAST SILICONE/EQ Right 1027    post mastectomy   ??? THUMB SUPPORT      thumb surgery to mobilize thumb     Family History   Problem Relation Age of Onset   ??? Diabetes Mother    ??? No Known Problems Father      Social History     Tobacco Use   ??? Smoking status: Current Some Day Smoker     Packs/day: 0.50     Years: 15.00     Pack years: 7.50     Types: Cigarettes   ??? Smokeless tobacco: Never Used   ??? Tobacco comment: 5-6 cigs per day patient education given   Substance Use Topics   ??? Alcohol use: No   ??? Drug use: No      Allergies   Allergen Reactions   ??? Tramadol Anaphylaxis   ??? Metformin Diarrhea   ??? Trulicity [Dulaglutide] Nausea Only  Current Outpatient Medications on File Prior to Visit   Medication Sig Dispense Refill   ??? HYDROcodone-acetaminophen (Norco) 5-325 mg per tablet Take 1 Tab by mouth every six (6) hours as needed for Pain for up to 5 days. Max Daily Amount: 4 Tabs. 10 Tab 0   ??? tiZANidine (ZANAFLEX) 2 mg tablet Take 1 Tab by mouth three (3) times daily as needed for Muscle Spasm(s). 30 Tab 2   ??? glucose blood VI test strips (FreeStyle Lite Strips) strip Use as directed to check blood sugar 4 times a day 100 Strip 1   ??? atorvastatin (LIPITOR) 10 mg tablet Take 1 Tab by mouth daily. 90 Tab 1   ??? ramipriL (ALTACE) 10 mg capsule Take 1 capsule by mouth once daily 90 Cap 1   ??? cholecalciferol (VITAMIN D3) (2,000 UNITS /50 MCG) cap capsule Take 2,000 Units by mouth daily. 90 Cap 3   ??? flash glucose scanning reader (FreeStyle Libre 14 Day Reader) misc Used as directed to check blood sugar 4 times a day 1 Each 0   ??? flash glucose sensor (FreeStyle Libre 14 Day Sensor) kit Use as directed to check blood sugar 4 times a day 1 Kit 5   ??? ALPRAZolam (Xanax) 0.5 mg tablet Take 0.5 Tabs by mouth two (2) times daily as needed for Anxiety. Max Daily Amount: 0.5 mg. 30 Tab 2   ??? diclofenac EC (VOLTAREN) 75 mg EC tablet Take 1 Tab by mouth two (2) times a day. 30 Tab 3   ??? venlafaxine-SR (EFFEXOR-XR) 75 mg capsule Take 3 Caps by mouth daily. 90 Cap 3   ??? albuterol (PROVENTIL HFA, VENTOLIN HFA, PROAIR HFA) 90 mcg/actuation inhaler Take 2 Puffs by inhalation every six (6) hours as needed for Wheezing. 1 Inhaler 3   ??? insulin NPH (NOVOLIN N, HUMULIN N) 100 unit/mL injection 40 Units in the AM, 35 Units in the PM (Patient taking differently: 45 Units in the AM, 40 Units in the PM) 1 Vial 5   ??? ibuprofen (MOTRIN) 600 mg tablet Take 1 Tab by mouth every six (6) hours as needed for Pain. 20 Tab 0   ??? gabapentin (NEURONTIN) 400 mg  capsule 800 mg PO TID 270 Cap 3   ??? nystatin (MYCOSTATIN) powder Apply to rash 4 times a day 60 g 3   ??? ketoconazole (NIZORAL) 2 % topical cream Apply  to affected area two (2) times a day. 60 g 3   ??? letrozole (FEMARA) 2.5 mg tablet Take 2.5 mg by mouth daily.       No current facility-administered medications on file prior to visit.      Health Maintenance Due   Topic Date Due   ??? Colonoscopy  11/27/1985   ??? PAP AKA CERVICAL CYTOLOGY  11/27/1988   ??? Shingrix Vaccine Age 46> (1 of 2) 11/27/2017         Objective     PHYSICAL EXAMINATION:    Vital Signs: (As obtained by patient/caregiver at home)   No flowsheet data found.       General: Alert, cooperative, no distress   Mental  status: Normal mood, behavior, speech, dress, motor activity, and thought processes, able to follow commands   HENT: Normocephalic, atraumatic   Neck: No visualized mass   Resp: No respiratory distress   Neuro: No gross deficits   Skin: No discoloration or lesions of concern on visible areas   Psychiatric: Normal affect, consistent with stated mood, no evidence of  hallucinations     Additional exam findings: none        Assessment and Plan     1. Type 2 diabetes mellitus without complication, with long-term current use of insulin (HCC)  Hold insulin the morning of surgery. At next meal take 1/2 of your usual dose. When eating normally, then resume usual dose of insulin.     A1C was 10.3. Increase novolin to 48 am and  43 pm.     Discussed and stress the importance of limiting carbs to no more that 30 gm per meal. Pt verbalized understanding.         Follow-up and Dispositions    ?? Return in about 2 weeks (around 05/26/2019).     with blood sugar logs           712  We discussed the expected course, resolution and complications of the diagnosis(es) in detail.  Medication risks, benefits, costs, interactions, and alternatives were discussed as indicated.  I advised her to contact the office if her condition worsens, changes or fails to improve  as anticipated. She expressed understanding with the diagnosis(es) and plan.     Barbara [name] is a 53 y.o. female who was evaluated by a video visit encounter for concerns as above. Patient identification was verified prior to start of the visit. A caregiver was present when appropriate. Due to this being a Scientist, physiological (During AYTKZ-60 public health emergency), evaluation of the following organ systems was limited: Vitals/Constitutional/EENT/Resp/CV/GI/GU/MS/Neuro/Skin/Heme-Lymph-Imm.  Pursuant to the emergency declaration under the [place], 1135 waiver authority and the [employer] Act, this Virtual  Visit was conducted, with patient's (and/or legal guardian's) consent, to reduce the patient's risk of exposure to COVID-19 and provide necessary medical care.     Services were provided through a video synchronous discussion virtually to substitute for in-person clinic visit.   Patient was located at home and provider was located at home.      Signed electronically by Lawrence Marseilles, DNP, FNP-BC

## 2019-05-12 NOTE — Progress Notes (Signed)
Barbara [name] was seen by synchronous (real-time) audio-video technology DOXY on 05/12/2019.      Consent: [name], who was seen by synchronous (real-time) audio-video technology, and/or her healthcare decision maker, is aware that this patient-initiated, Telehealth encounter on 05/12/2019 is a billable service, with coverage as determined by her insurance carrier. She is aware that she may receive a bill and has provided verbal consent to proceed: yes  712  Subjective:     HPI:  Barbara [name] is a 53 y.o. female who was seen for the following:     Upcoming surgery on 8/10 to remove nodules from right hand. Dr. Mady Gemma. She requests insulin dosing instructions.     Her blood sugar for the last couple weeks ranges from 77 to 301.     When it was 77 she had skipped a meal. When it was 301 she had eaten a piece of cake.     Starting PT for her neck. Saw the spinal specialist and will be following up with the MRI disk.     States things at home are getting better. Her daughter is now helping her.       Review of Systems   Constitutional: Negative for appetite change, diaphoresis, fatigue and unexpected weight change.   Eyes: Negative for visual disturbance.   Respiratory: Negative for cough, chest tightness and shortness of breath.    Cardiovascular: Negative for chest pain, palpitations and leg swelling.   Gastrointestinal: Negative for abdominal distention, abdominal pain, blood in stool, constipation, diarrhea, nausea, rectal pain and vomiting.   Endocrine: Negative for polydipsia, polyphagia and polyuria.   Genitourinary: Negative for decreased urine volume, dysuria and frequency.   Musculoskeletal: Positive for neck pain (chronic). Negative for joint swelling and myalgias.   Skin: Negative for rash and wound.   Neurological: Negative for dizziness, weakness, light-headedness, numbness and headaches.   Psychiatric/Behavioral: Negative for dysphoric mood and sleep disturbance.  The patient is not nervous/anxious.         Past Medical History, Past Surgery History, Allergies, Social History, and Family History were reviewed and updated.      Patient Active Problem List   Diagnosis Code   ??? Essential hypertension I10   ??? 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 Pain of left hip joint M25.552   ??? Low back pain M54.5   ??? Smoker F17.200   ??? Dupuytren contracture M72.0   ??? S/P mastectomy, left Z90.12   ??? Cigarette nicotine dependence without complication Y09.983   ??? Recurrent depression (HCC) F33.9   ??? History of breast cancer Z85.3   ??? Hypertensive retinopathy H35.039   ??? Aneurysm (HCC) I72.9   ??? 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 DDD (degenerative disc disease), cervical M50.30   ??? Peripheral vascular disease (HCC) I73.9   ??? Carotid artery stenosis I65.29   ??? Vertebral artery stenosis I65.09     Past Medical History:   Diagnosis Date   ??? Aneurysm ([place]) 2020    right MCA 3-4 mm and left ICA carotid cave 3 mm   ??? Atherosclerosis 2020   ??? Back pain    ??? Breast cancer ([place]) 2008 or 2009   ??? Carotid artery stenosis     2020 Dr. Constance Goltz, plans to repeat US in 2 years    ??? Chronic pain    ??? DDD (degenerative disc disease), cervical  multilevel   ??? Diabetes ([place])    ??? Facet joint disease of cervical region    ??? FH: chemotherapy    ??? Hypertension    ??? Hypertensive retinopathy 2020   ??? Nausea & vomiting    ??? Neuropathy    ??? Peripheral vascular disease ([place]) 2020    LLE Dr. Constance Goltz   ??? Radiation therapy complication 7989    NOT complication, just radiation   ??? Vertebral artery stenosis     2020, per Dr. Constance Goltz - mild     Patient Care Team:  Toniann Ket, NP as PCP - General (Nurse Practitioner)  Isaiah Blakes Kerrin Champagne, NP as PCP - [facility] Empaneled Provider  Anastasio Auerbach., RN as Nurse Navigator (Oncology)  Reita Cliche, MD (Breast Surgery)  Lilian Coma, MD as Physician (Physical Medicine and  Rehabilitation)  Constance Goltz Brynda Peon, MD (Vascular Surgery)    Past Surgical History:   Procedure Laterality Date   ??? BIOPSY BREAST     ??? BIOPSY FINE NEEDLE     ??? HX BREAST AUGMENTATION Right 2010   ??? HX BREAST AUGMENTATION Left 09/17/2017    left breast reconstruction with exchange of tissue expander per breast prostesis and left breast tissue expander performed by [name], MD at [place]   ??? HX BREAST AUGMENTATION Right 04/01/2018    removal of tissue expanders with placment of right permanent breast prostesis performed by [name], MD at [place]   ??? HX BREAST RECONSTRUCTION Right 2011    per pt - attempted latissimus dorsi flap, then saline implant   ??? HX BREAST RECONSTRUCTION Bilateral 06/10/2017    right breast prosthesis and capsule removal and bilateral breast tissue  expander reconstruction.  performed by [name], MD at [place]   ??? HX BREAST RECONSTRUCTION Left 07/09/2017    left breast  EXCISION and closure of wound performed by [name], MD at [place]   ??? HX BREAST RECONSTRUCTION Bilateral     Tissue expanders, to prepare for implants    ??? HX CESAREAN SECTION     ??? HX LUMBAR DISKECTOMY      herniated disc   ??? HX MASTECTOMY Right 2010    Total mastectomy per pt.     ??? HX MASTECTOMY Left 06/10/2017    left mastectomy,Sentinal node biopsies. performed by [name], MD at [facility] MAIN OR   ??? HX MASTECTOMY Bilateral    ??? HX MASTOPEXY (BREAST LIFT) Left 2011   ??? IMPLANT BREAST SILICONE/EQ Right 2119    post mastectomy   ??? THUMB SUPPORT      thumb surgery to mobilize thumb     Family History   Problem Relation Age of Onset   ??? Diabetes Mother    ??? No Known Problems Father      Social History     Tobacco Use   ??? Smoking status: Current Some Day Smoker     Packs/day: 0.50     Years: 15.00     Pack years: 7.50     Types: Cigarettes   ??? Smokeless tobacco: Never Used   ??? Tobacco comment: 5-6 cigs per day patient education given   Substance Use Topics    ??? Alcohol use: No   ??? Drug use: No     Allergies   Allergen Reactions   ??? Tramadol Anaphylaxis   ??? Metformin Diarrhea   ??? Trulicity [Dulaglutide] Nausea Only  Current Outpatient Medications on File Prior to Visit   Medication Sig Dispense Refill   ??? HYDROcodone-acetaminophen (Norco) 5-325 mg per tablet Take 1 Tab by mouth every six (6) hours as needed for Pain for up to 5 days. Max Daily Amount: 4 Tabs. 10 Tab 0   ??? tiZANidine (ZANAFLEX) 2 mg tablet Take 1 Tab by mouth three (3) times daily as needed for Muscle Spasm(s). 30 Tab 2   ??? glucose blood VI test strips (FreeStyle Lite Strips) strip Use as directed to check blood sugar 4 times a day 100 Strip 1   ??? atorvastatin (LIPITOR) 10 mg tablet Take 1 Tab by mouth daily. 90 Tab 1   ??? ramipriL (ALTACE) 10 mg capsule Take 1 capsule by mouth once daily 90 Cap 1   ??? cholecalciferol (VITAMIN D3) (2,000 UNITS /50 MCG) cap capsule Take 2,000 Units by mouth daily. 90 Cap 3   ??? flash glucose scanning reader (FreeStyle Libre 14 Day Reader) misc Used as directed to check blood sugar 4 times a day 1 Each 0   ??? flash glucose sensor (FreeStyle Libre 14 Day Sensor) kit Use as directed to check blood sugar 4 times a day 1 Kit 5   ??? ALPRAZolam (Xanax) 0.5 mg tablet Take 0.5 Tabs by mouth two (2) times daily as needed for Anxiety. Max Daily Amount: 0.5 mg. 30 Tab 2   ??? diclofenac EC (VOLTAREN) 75 mg EC tablet Take 1 Tab by mouth two (2) times a day. 30 Tab 3   ??? venlafaxine-SR (EFFEXOR-XR) 75 mg capsule Take 3 Caps by mouth daily. 90 Cap 3   ??? albuterol (PROVENTIL HFA, VENTOLIN HFA, PROAIR HFA) 90 mcg/actuation inhaler Take 2 Puffs by inhalation every six (6) hours as needed for Wheezing. 1 Inhaler 3   ??? insulin NPH (NOVOLIN N, HUMULIN N) 100 unit/mL injection 40 Units in the AM, 35 Units in the PM (Patient taking differently: 45 Units in the AM, 40 Units in the PM) 1 Vial 5   ??? ibuprofen (MOTRIN) 600 mg tablet Take 1 Tab by mouth every six (6) hours as needed for Pain. 20 Tab 0    ??? gabapentin (NEURONTIN) 400 mg capsule 800 mg PO TID 270 Cap 3   ??? nystatin (MYCOSTATIN) powder Apply to rash 4 times a day 60 g 3   ??? ketoconazole (NIZORAL) 2 % topical cream Apply  to affected area two (2) times a day. 60 g 3   ??? letrozole (FEMARA) 2.5 mg tablet Take 2.5 mg by mouth daily.       No current facility-administered medications on file prior to visit.      Health Maintenance Due   Topic Date Due   ??? Colonoscopy  11/27/1985   ??? PAP AKA CERVICAL CYTOLOGY  11/27/1988   ??? Shingrix Vaccine Age 5> (1 of 2) 11/27/2017         Objective     PHYSICAL EXAMINATION:    Vital Signs: (As obtained by patient/caregiver at home)   No flowsheet data found.       General: Alert, cooperative, no distress   Mental  status: Normal mood, behavior, speech, dress, motor activity, and thought processes, able to follow commands   HENT: Normocephalic, atraumatic   Neck: No visualized mass   Resp: No respiratory distress   Neuro: No gross deficits   Skin: No discoloration or lesions of concern on visible areas   Psychiatric: Normal affect, consistent with stated mood, no evidence of  hallucinations     Additional exam findings: none        Assessment and Plan     1. Type 2 diabetes mellitus without complication, with long-term current use of insulin (HCC)  Hold insulin the morning of surgery. At next meal take 1/2 of your usual dose. When eating normally, then resume usual dose of insulin.     A1C was 10.3. Increase novolin to 48 am and  43 pm.     Discussed and stress the importance of limiting carbs to no more that 30 gm per meal. Pt verbalized understanding.         Follow-up and Dispositions    ?? Return in about 2 weeks (around 05/26/2019).     with blood sugar logs           712  We discussed the expected course, resolution and complications of the diagnosis(es) in detail.  Medication risks, benefits, costs, interactions, and alternatives were discussed as indicated.  I advised her to contact  the office if her condition worsens, changes or fails to improve as anticipated. She expressed understanding with the diagnosis(es) and plan.     Barbara [name] is a 53 y.o. female who was evaluated by a video visit encounter for concerns as above. Patient identification was verified prior to start of the visit. A caregiver was present when appropriate. Due to this being a Scientist, physiological (During KPVVZ-48 public health emergency), evaluation of the following organ systems was limited: Vitals/Constitutional/EENT/Resp/CV/GI/GU/MS/Neuro/Skin/Heme-Lymph-Imm.  Pursuant to the emergency declaration under the [place], 1135 waiver authority and the [employer] Act, this Virtual  Visit was conducted, with patient's (and/or legal guardian's) consent, to reduce the patient's risk of exposure to COVID-19 and provide necessary medical care.     Services were provided through a video synchronous discussion virtually to substitute for in-person clinic visit.   Patient was located at home and provider was located at home.      Signed electronically by Lawrence Marseilles, DNP, FNP-BC

## 2019-05-13 ENCOUNTER — Encounter

## 2019-05-13 NOTE — Telephone Encounter (Signed)
Pt called in and informed me she was wanting a refill of her prescription. Please advise.

## 2019-05-14 MED ORDER — ALPRAZOLAM 0.5 MG TAB
0.5 mg | ORAL_TABLET | Freq: Two times a day (BID) | ORAL | 2 refills | Status: DC | PRN
Start: 2019-05-14 — End: 2019-09-15

## 2019-05-14 NOTE — Telephone Encounter (Signed)
Pt called in and was checking on the status of her refill request. Please advise.

## 2019-05-18 ENCOUNTER — Encounter: Primary: Nurse Practitioner

## 2019-05-19 NOTE — Telephone Encounter (Signed)
Please schedule with next available for face to face.

## 2019-05-19 NOTE — Telephone Encounter (Signed)
Pt called in and was wanting to know if she could speak with Lelan Pons in regards to some swelling she is experiencing in her left leg and foot. She said she doesn't know why it's swelling. VV? Please advise.

## 2019-05-22 ENCOUNTER — Encounter

## 2019-05-26 ENCOUNTER — Encounter

## 2019-05-26 MED ORDER — VENLAFAXINE SR 75 MG 24 HR CAP
75 mg | ORAL_CAPSULE | ORAL | 0 refills | Status: DC
Start: 2019-05-26 — End: 2019-07-03

## 2019-05-26 NOTE — Telephone Encounter (Signed)
Pt called in and was requesting a refill of her medication. Please advise.

## 2019-05-31 MED ORDER — GABAPENTIN 400 MG CAP
400 mg | ORAL_CAPSULE | ORAL | 3 refills | Status: DC
Start: 2019-05-31 — End: 2019-09-15

## 2019-06-01 ENCOUNTER — Telehealth: Attending: Nurse Practitioner | Primary: Nurse Practitioner

## 2019-06-01 ENCOUNTER — Telehealth
Admit: 2019-06-01 | Discharge: 2019-06-01 | Payer: PRIVATE HEALTH INSURANCE | Attending: Nurse Practitioner | Primary: Nurse Practitioner

## 2019-06-01 DIAGNOSIS — E119 Type 2 diabetes mellitus without complications: Secondary | ICD-10-CM

## 2019-06-01 MED ORDER — INSULIN NPH HUMAN RECOMB 100 UNIT/ML INJECTION
100 unit/mL | SUBCUTANEOUS | 5 refills | Status: DC
Start: 2019-06-01 — End: 2019-08-17

## 2019-06-01 NOTE — Progress Notes (Signed)
Barbara [name] was seen by synchronous (real-time) audio-video technology DOXY on 06/01/2019.      Consent: [name], who was seen by synchronous (real-time) audio-video technology, and/or her healthcare decision maker, is aware that this patient-initiated, Telehealth encounter on 06/01/2019 is a billable service, with coverage as determined by her insurance carrier. She is aware that she may receive a bill and has provided verbal consent to proceed: yes  712  Subjective:     HPI:  Barbara [name] is a 53 y.o. female who was seen for the following:     Chronic neck pain:   Previously referred to PT, but has not started yet.     S/P wrist surgery:   Will be following up Friday to have her stiches removed. Her dressing is clean, dry, and intact. No longer taking pain medication.     Diabetes, type 2 follow up:  Taking medications as prescribed: Yes, humalog 70/30 is now 50 units with breakfast and dinner.   Following diabetic diet: Yes  Exercise: Yes  Checking blood sugar: Yes, fasting has been in 100s  Symptoms of hyperglycemia: none  Symptoms of hypoglycemia: none    Depression with anxiety:   Improved, but "I still have my moments." Takes effexor daily, xanax only prn.       Review of Systems   Constitutional: Negative for appetite change, diaphoresis, fatigue and unexpected weight change.   Eyes: Negative for visual disturbance.   Respiratory: Negative for cough, chest tightness and shortness of breath.    Cardiovascular: Negative for chest pain, palpitations and leg swelling.   Gastrointestinal: Negative for abdominal distention, abdominal pain, blood in stool, constipation, diarrhea, nausea, rectal pain and vomiting.   Endocrine: Negative for polydipsia, polyphagia and polyuria.   Genitourinary: Negative for decreased urine volume, dysuria and frequency.   Musculoskeletal: Positive for arthralgias and neck pain. Negative for joint swelling and myalgias.   Skin: Negative for rash and wound.    Neurological: Negative for dizziness, weakness, light-headedness, numbness and headaches.   Psychiatric/Behavioral: Negative for dysphoric mood, sleep disturbance and suicidal ideas. The patient is not nervous/anxious.         Past Medical History, Past Surgery History, Allergies, Social History, and Family History were reviewed and updated.      Patient Active Problem List   Diagnosis Code   ??? Essential hypertension I10   ??? DM type 2, uncontrolled, with neuropathy (Barbara [name]) E11.40, E11.65   ??? Pain of left hip joint M25.552   ??? Low back pain M54.5   ??? Smoker F17.200   ??? Dupuytren contracture M72.0   ??? S/P mastectomy, left Z90.12   ??? Cigarette nicotine dependence without complication A35.573   ??? Recurrent depression (HCC) F33.9   ??? History of breast cancer Z85.3   ??? Hypertensive retinopathy H35.039   ??? Aneurysm (HCC) I72.9   ??? 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 DDD (degenerative disc disease), cervical M50.30   ??? Peripheral vascular disease (HCC) I73.9   ??? Carotid artery stenosis I65.29   ??? Vertebral artery stenosis I65.09     Past Medical History:   Diagnosis Date   ??? Aneurysm ([place]) 2020    right MCA 3-4 mm and left ICA carotid cave 3 mm   ??? Atherosclerosis 2020   ??? Back pain    ??? Breast cancer ([place]) 2008 or 2009   ??? Carotid artery stenosis  2020 Dr. Constance Goltz, plans to repeat US in 2 years    ??? Chronic pain    ??? DDD (degenerative disc disease), cervical     multilevel   ??? Diabetes ([place])    ??? Facet joint disease of cervical region    ??? FH: chemotherapy    ??? Hypertension    ??? Hypertensive retinopathy 2020   ??? Nausea & vomiting    ??? Neuropathy    ??? Peripheral vascular disease ([place]) 2020    LLE Dr. Constance Goltz   ??? Radiation therapy complication 2595    NOT complication, just radiation   ??? Vertebral artery stenosis     2020, per Dr. Constance Goltz - mild     Patient Care Team:  Toniann Ket, NP as PCP - General (Nurse Practitioner)  Isaiah Blakes Kerrin Champagne, NP as PCP - [facility]  Empaneled Provider  Anastasio Auerbach., RN as Nurse Navigator (Oncology)  Reita Cliche, MD (Breast Surgery)  Lilian Coma, MD as Physician (Physical Medicine and Rehabilitation)  Constance Goltz Brynda Peon, MD (Vascular Surgery)    Past Surgical History:   Procedure Laterality Date   ??? BIOPSY BREAST     ??? BIOPSY FINE NEEDLE     ??? HX BREAST AUGMENTATION Right 2010   ??? HX BREAST AUGMENTATION Left 09/17/2017    left breast reconstruction with exchange of tissue expander per breast prostesis and left breast tissue expander performed by [name], MD at [place]   ??? HX BREAST AUGMENTATION Right 04/01/2018    removal of tissue expanders with placment of right permanent breast prostesis performed by [name], MD at [place]   ??? HX BREAST RECONSTRUCTION Right 2011    per pt - attempted latissimus dorsi flap, then saline implant   ??? HX BREAST RECONSTRUCTION Bilateral 06/10/2017    right breast prosthesis and capsule removal and bilateral breast tissue  expander reconstruction.  performed by [name], MD at [place]   ??? HX BREAST RECONSTRUCTION Left 07/09/2017    left breast  EXCISION and closure of wound performed by [name], MD at [place]   ??? HX BREAST RECONSTRUCTION Bilateral     Tissue expanders, to prepare for implants    ??? HX CESAREAN SECTION     ??? HX LUMBAR DISKECTOMY      herniated disc   ??? HX MASTECTOMY Right 2010    Total mastectomy per pt.     ??? HX MASTECTOMY Left 06/10/2017    left mastectomy,Sentinal node biopsies. performed by [name], MD at [facility] MAIN OR   ??? HX MASTECTOMY Bilateral    ??? HX MASTOPEXY (BREAST LIFT) Left 2011   ??? IMPLANT BREAST SILICONE/EQ Right 6387    post mastectomy   ??? THUMB SUPPORT      thumb surgery to mobilize thumb     Family History   Problem Relation Age of Onset   ??? Diabetes Mother    ??? No Known Problems Father      Social History     Tobacco Use   ??? Smoking status: Current Some Day Smoker     Packs/day: 0.50     Years:  15.00     Pack years: 7.50     Types: Cigarettes   ??? Smokeless tobacco: Never Used   ??? Tobacco comment: 5-6 cigs per day patient education given   Substance Use Topics   ??? Alcohol use: No   ??? Drug use:  No     Allergies   Allergen Reactions   ??? Tramadol Anaphylaxis   ??? Metformin Diarrhea   ??? Trulicity [Dulaglutide] Nausea Only     Current Outpatient Medications on File Prior to Visit   Medication Sig Dispense Refill   ??? gabapentin (NEURONTIN) 400 mg capsule 800 mg PO TID 180 Cap 3   ??? venlafaxine-SR (EFFEXOR-XR) 75 mg capsule TAKE 3 CAPSULES BY MOUTH ONCE DAILY 90 Cap 0   ??? ALPRAZolam (Xanax) 0.5 mg tablet Take 0.5 Tabs by mouth two (2) times daily as needed for Anxiety. Max Daily Amount: 0.5 mg. 30 Tab 2   ??? tiZANidine (ZANAFLEX) 2 mg tablet Take 1 Tab by mouth three (3) times daily as needed for Muscle Spasm(s). 30 Tab 2   ??? glucose blood VI test strips (FreeStyle Lite Strips) strip Use as directed to check blood sugar 4 times a day 100 Strip 1   ??? atorvastatin (LIPITOR) 10 mg tablet Take 1 Tab by mouth daily. 90 Tab 1   ??? ramipriL (ALTACE) 10 mg capsule Take 1 capsule by mouth once daily 90 Cap 1   ??? cholecalciferol (VITAMIN D3) (2,000 UNITS /50 MCG) cap capsule Take 2,000 Units by mouth daily. 90 Cap 3   ??? flash glucose scanning reader (FreeStyle Libre 14 Day Reader) misc Used as directed to check blood sugar 4 times a day 1 Each 0   ??? flash glucose sensor (FreeStyle Libre 14 Day Sensor) kit Use as directed to check blood sugar 4 times a day 1 Kit 5   ??? diclofenac EC (VOLTAREN) 75 mg EC tablet Take 1 Tab by mouth two (2) times a day. 30 Tab 3   ??? albuterol (PROVENTIL HFA, VENTOLIN HFA, PROAIR HFA) 90 mcg/actuation inhaler Take 2 Puffs by inhalation every six (6) hours as needed for Wheezing. 1 Inhaler 3   ??? ibuprofen (MOTRIN) 600 mg tablet Take 1 Tab by mouth every six (6) hours as needed for Pain. 20 Tab 0   ??? nystatin (MYCOSTATIN) powder Apply to rash 4 times a day 60 g 3   ??? ketoconazole (NIZORAL) 2 % topical  cream Apply  to affected area two (2) times a day. 60 g 3   ??? letrozole (FEMARA) 2.5 mg tablet Take 2.5 mg by mouth daily.       No current facility-administered medications on file prior to visit.      Health Maintenance Due   Topic Date Due   ??? Colonoscopy  11/27/1985   ??? PAP AKA CERVICAL CYTOLOGY  11/27/1988   ??? Shingrix Vaccine Age 46> (1 of 2) 11/27/2017         Objective     PHYSICAL EXAMINATION:    Vital Signs: (As obtained by patient/caregiver at home)   No flowsheet data found.       General: Alert, cooperative, no distress   Mental  status: Normal mood, behavior, speech, dress, motor activity, and thought processes, able to follow commands   HENT: Normocephalic, atraumatic   Neck: No visualized mass   Resp: No respiratory distress   Neuro: No gross deficits   Skin: No discoloration or lesions of concern on visible areas   Psychiatric: Normal affect, consistent with stated mood, no evidence of hallucinations     Additional exam findings: left wrist dressing appears clean, dry, and intact. Color of the fingers is normal.       Assessment and Plan     1. Type 2 diabetes mellitus without  complication, with long-term current use of insulin (HCC)  Fasting glucose improved. Recheck labs. Further plan after results.   - insulin NPH (NOVOLIN N, HUMULIN N) 100 unit/mL injection; 50 Units in the AM, 50 Units in the PM  Dispense: 1 Vial; Refill: 5  - CBC WITH AUTOMATED DIFF; Future  - HEMOGLOBIN A1C W/O EAG; Future  - METABOLIC PANEL, COMPREHENSIVE; Future  - URINALYSIS W/ RFLX MICROSCOPIC; Future    2. Depression with anxiety  Well controlled now. Continue current meds.     3. Chronic neck pain with history of cervical spinal surgery  Start PT.     4. Mass of hand, right  S/P surgical removal. Follow up with surgeon as planned.       Follow-up and Dispositions    ?? Return for labs in 2 months, then vv with Lelan Pons about 1 week later, diabetes EOV.             712  We discussed the expected course, resolution and  complications of the diagnosis(es) in detail.  Medication risks, benefits, costs, interactions, and alternatives were discussed as indicated.  I advised her to contact the office if her condition worsens, changes or fails to improve as anticipated. She expressed understanding with the diagnosis(es) and plan.     Barbara [name] is a 53 y.o. female who was evaluated by a video visit encounter for concerns as above. Patient identification was verified prior to start of the visit. A caregiver was present when appropriate. 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  Pursuant to the emergency declaration under the [place], 1135 waiver authority and the [employer] Act, this Virtual  Visit was conducted, with patient's (and/or legal guardian's) consent, to reduce the patient's risk of exposure to COVID-19 and provide necessary medical care.     Services were provided through a video synchronous discussion virtually to substitute for in-person clinic visit.   Patient was located at home and provider was located at the office.      Signed electronically by Lawrence Marseilles, DNP, FNP-BC

## 2019-06-01 NOTE — Progress Notes (Signed)
Barbara [name] was seen by synchronous (real-time) audio-video technology DOXY on 06/01/2019.      Consent: Barbara [name], who was seen by synchronous (real-time) audio-video technology, and/or her healthcare decision maker, is aware that this patient-initiated, Telehealth encounter on 06/01/2019 is a billable service, with coverage as determined by her insurance carrier. She is aware that she may receive a bill and has provided verbal consent to proceed: yes  712  Subjective:     HPI:  Barbara [name] is a 53 y.o. female who was seen for the following:     Chronic neck pain:   Previously referred to PT, but has not started yet.     S/P wrist surgery:   Will be following up Friday to have her stiches removed. Her dressing is clean, dry, and intact. No longer taking pain medication.     Diabetes, type 2 follow up:  Taking medications as prescribed: Yes, humalog 70/30 is now 50 units with breakfast and dinner.   Following diabetic diet: Yes  Exercise: Yes  Checking blood sugar: Yes, fasting has been in 100s  Symptoms of hyperglycemia: none  Symptoms of hypoglycemia: none    Depression with anxiety:   Improved, but "I still have my moments." Takes effexor daily, xanax only prn.       Review of Systems   Constitutional: Negative for appetite change, diaphoresis, fatigue and unexpected weight change.   Eyes: Negative for visual disturbance.   Respiratory: Negative for cough, chest tightness and shortness of breath.    Cardiovascular: Negative for chest pain, palpitations and leg swelling.   Gastrointestinal: Negative for abdominal distention, abdominal pain, blood in stool, constipation, diarrhea, nausea, rectal pain and vomiting.   Endocrine: Negative for polydipsia, polyphagia and polyuria.   Genitourinary: Negative for decreased urine volume, dysuria and frequency.   Musculoskeletal: Positive for arthralgias and neck pain. Negative for joint swelling and myalgias.   Skin: Negative for rash and wound.    Neurological: Negative for dizziness, weakness, light-headedness, numbness and headaches.   Psychiatric/Behavioral: Negative for dysphoric mood, sleep disturbance and suicidal ideas. The patient is not nervous/anxious.         Past Medical History, Past Surgery History, Allergies, Social History, and Family History were reviewed and updated.      Patient Active Problem List   Diagnosis Code   ??? Essential hypertension I10   ??? DM type 2, uncontrolled, with neuropathy (Barbara [name]) E11.40, E11.65   ??? Pain of left hip joint M25.552   ??? Low back pain M54.5   ??? Smoker F17.200   ??? Dupuytren contracture M72.0   ??? S/P mastectomy, left Z90.12   ??? Cigarette nicotine dependence without complication A35.573   ??? Recurrent depression (HCC) F33.9   ??? History of breast cancer Z85.3   ??? Hypertensive retinopathy H35.039   ??? Aneurysm (HCC) I72.9   ??? 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 DDD (degenerative disc disease), cervical M50.30   ??? Peripheral vascular disease (HCC) I73.9   ??? Carotid artery stenosis I65.29   ??? Vertebral artery stenosis I65.09     Past Medical History:   Diagnosis Date   ??? Aneurysm ([place]) 2020    right MCA 3-4 mm and left ICA carotid cave 3 mm   ??? Atherosclerosis 2020   ??? Back pain    ??? Breast cancer ([place]) 2008 or 2009   ??? Carotid artery stenosis  2020 Dr. Constance Goltz, plans to repeat US in 2 years    ??? Chronic pain    ??? DDD (degenerative disc disease), cervical     multilevel   ??? Diabetes (Barbara [name])    ??? Facet joint disease of cervical region    ??? FH: chemotherapy    ??? Hypertension    ??? Hypertensive retinopathy 2020   ??? Nausea & vomiting    ??? Neuropathy    ??? Peripheral vascular disease ([place]) 2020    LLE Dr. Constance Goltz   ??? Radiation therapy complication 5643    NOT complication, just radiation   ??? Vertebral artery stenosis     2020, per Dr. Constance Goltz - mild     Patient Care Team:  Toniann Ket, NP as PCP - General (Nurse Practitioner)   Isaiah Blakes Kerrin Champagne, NP as PCP - [facility] Empaneled Provider  Anastasio Auerbach., RN as Nurse Navigator (Oncology)  Reita Cliche, MD (Breast Surgery)  Lilian Coma, MD as Physician (Physical Medicine and Rehabilitation)  Constance Goltz Brynda Peon, MD (Vascular Surgery)    Past Surgical History:   Procedure Laterality Date   ??? BIOPSY BREAST     ??? BIOPSY FINE NEEDLE     ??? HX BREAST AUGMENTATION Right 2010   ??? HX BREAST AUGMENTATION Left 09/17/2017    left breast reconstruction with exchange of tissue expander per breast prostesis and left breast tissue expander performed by [name], MD at [place]   ??? HX BREAST AUGMENTATION Right 04/01/2018    removal of tissue expanders with placment of right permanent breast prostesis performed by [name], MD at [place]   ??? HX BREAST RECONSTRUCTION Right 2011    per pt - attempted latissimus dorsi flap, then saline implant   ??? HX BREAST RECONSTRUCTION Bilateral 06/10/2017    right breast prosthesis and capsule removal and bilateral breast tissue  expander reconstruction.  performed by [name], MD at [place]   ??? HX BREAST RECONSTRUCTION Left 07/09/2017    left breast  EXCISION and closure of wound performed by [name], MD at [place]   ??? HX BREAST RECONSTRUCTION Bilateral     Tissue expanders, to prepare for implants    ??? HX CESAREAN SECTION     ??? HX LUMBAR DISKECTOMY      herniated disc   ??? HX MASTECTOMY Right 2010    Total mastectomy per pt.     ??? HX MASTECTOMY Left 06/10/2017    left mastectomy,Sentinal node biopsies. performed by [name], MD at [facility] MAIN OR   ??? HX MASTECTOMY Bilateral    ??? HX MASTOPEXY (BREAST LIFT) Left 2011   ??? IMPLANT BREAST SILICONE/EQ Right 3295    post mastectomy   ??? THUMB SUPPORT      thumb surgery to mobilize thumb     Family History   Problem Relation Age of Onset   ??? Diabetes Mother    ??? No Known Problems Father      Social History     Tobacco Use    ??? Smoking status: Current Some Day Smoker     Packs/day: 0.50     Years: 15.00     Pack years: 7.50     Types: Cigarettes   ??? Smokeless tobacco: Never Used   ??? Tobacco comment: 5-6 cigs per day patient education given   Substance Use Topics   ??? Alcohol use: No   ??? Drug use:  No     Allergies   Allergen Reactions   ??? Tramadol Anaphylaxis   ??? Metformin Diarrhea   ??? Trulicity [Dulaglutide] Nausea Only     Current Outpatient Medications on File Prior to Visit   Medication Sig Dispense Refill   ??? gabapentin (NEURONTIN) 400 mg capsule 800 mg PO TID 180 Cap 3   ??? venlafaxine-SR (EFFEXOR-XR) 75 mg capsule TAKE 3 CAPSULES BY MOUTH ONCE DAILY 90 Cap 0   ??? ALPRAZolam (Xanax) 0.5 mg tablet Take 0.5 Tabs by mouth two (2) times daily as needed for Anxiety. Max Daily Amount: 0.5 mg. 30 Tab 2   ??? tiZANidine (ZANAFLEX) 2 mg tablet Take 1 Tab by mouth three (3) times daily as needed for Muscle Spasm(s). 30 Tab 2   ??? glucose blood VI test strips (FreeStyle Lite Strips) strip Use as directed to check blood sugar 4 times a day 100 Strip 1   ??? atorvastatin (LIPITOR) 10 mg tablet Take 1 Tab by mouth daily. 90 Tab 1   ??? ramipriL (ALTACE) 10 mg capsule Take 1 capsule by mouth once daily 90 Cap 1   ??? cholecalciferol (VITAMIN D3) (2,000 UNITS /50 MCG) cap capsule Take 2,000 Units by mouth daily. 90 Cap 3   ??? flash glucose scanning reader (FreeStyle Libre 14 Day Reader) misc Used as directed to check blood sugar 4 times a day 1 Each 0   ??? flash glucose sensor (FreeStyle Libre 14 Day Sensor) kit Use as directed to check blood sugar 4 times a day 1 Kit 5   ??? diclofenac EC (VOLTAREN) 75 mg EC tablet Take 1 Tab by mouth two (2) times a day. 30 Tab 3   ??? albuterol (PROVENTIL HFA, VENTOLIN HFA, PROAIR HFA) 90 mcg/actuation inhaler Take 2 Puffs by inhalation every six (6) hours as needed for Wheezing. 1 Inhaler 3   ??? ibuprofen (MOTRIN) 600 mg tablet Take 1 Tab by mouth every six (6) hours as needed for Pain. 20 Tab 0    ??? nystatin (MYCOSTATIN) powder Apply to rash 4 times a day 60 g 3   ??? ketoconazole (NIZORAL) 2 % topical cream Apply  to affected area two (2) times a day. 60 g 3   ??? letrozole (FEMARA) 2.5 mg tablet Take 2.5 mg by mouth daily.       No current facility-administered medications on file prior to visit.      Health Maintenance Due   Topic Date Due   ??? Colonoscopy  11/27/1985   ??? PAP AKA CERVICAL CYTOLOGY  11/27/1988   ??? Shingrix Vaccine Age 45> (1 of 2) 11/27/2017         Objective     PHYSICAL EXAMINATION:    Vital Signs: (As obtained by patient/caregiver at home)   No flowsheet data found.       General: Alert, cooperative, no distress   Mental  status: Normal mood, behavior, speech, dress, motor activity, and thought processes, able to follow commands   HENT: Normocephalic, atraumatic   Neck: No visualized mass   Resp: No respiratory distress   Neuro: No gross deficits   Skin: No discoloration or lesions of concern on visible areas   Psychiatric: Normal affect, consistent with stated mood, no evidence of hallucinations     Additional exam findings: left wrist dressing appears clean, dry, and intact. Color of the fingers is normal.       Assessment and Plan     1. Type 2 diabetes mellitus without  complication, with long-term current use of insulin (HCC)  Fasting glucose improved. Recheck labs. Further plan after results.   - insulin NPH (NOVOLIN N, HUMULIN N) 100 unit/mL injection; 50 Units in the AM, 50 Units in the PM  Dispense: 1 Vial; Refill: 5  - CBC WITH AUTOMATED DIFF; Future  - HEMOGLOBIN A1C W/O EAG; Future  - METABOLIC PANEL, COMPREHENSIVE; Future  - URINALYSIS W/ RFLX MICROSCOPIC; Future    2. Depression with anxiety  Well controlled now. Continue current meds.     3. Chronic neck pain with history of cervical spinal surgery  Start PT.     4. Mass of hand, right  S/P surgical removal. Follow up with surgeon as planned.       Follow-up and Dispositions     ?? Return for labs in 2 months, then vv with Lelan Pons about 1 week later, diabetes EOV.             712  We discussed the expected course, resolution and complications of the diagnosis(es) in detail.  Medication risks, benefits, costs, interactions, and alternatives were discussed as indicated.  I advised her to contact the office if her condition worsens, changes or fails to improve as anticipated. She expressed understanding with the diagnosis(es) and plan.     Barbara [name] is a 52 y.o. female who was evaluated by a video visit encounter for concerns as above. Patient identification was verified prior to start of the visit. A caregiver was present when appropriate. Due to this being a Scientist, physiological (During DTOIZ-12 public health emergency), evaluation of the following organ systems was limited: Vitals/Constitutional/EENT/Resp/CV/GI/GU/MS/Neuro/Skin/Heme-Lymph-Imm.  Pursuant to the emergency declaration under the [place], 1135 waiver authority and the [employer] Act, this Virtual  Visit was conducted, with patient's (and/or legal guardian's) consent, to reduce the patient's risk of exposure to COVID-19 and provide necessary medical care.     Services were provided through a video synchronous discussion virtually to substitute for in-person clinic visit.   Patient was located at home and provider was located at the office.      Signed electronically by Lawrence Marseilles, DNP, FNP-BC

## 2019-06-09 ENCOUNTER — Inpatient Hospital Stay
Admit: 2019-06-09 | Discharge: 2019-06-10 | Disposition: A | Discharge status: Home / Self Care | Payer: PRIVATE HEALTH INSURANCE | Attending: Emergency Medicine

## 2019-06-09 ENCOUNTER — Emergency Department: Admit: 2019-06-10 | Payer: PRIVATE HEALTH INSURANCE | Primary: Nurse Practitioner

## 2019-06-09 DIAGNOSIS — R1011 Right upper quadrant pain: Secondary | ICD-10-CM

## 2019-06-09 NOTE — ED Notes (Signed)
 Pt to triage c/o N/V x 2 days. States she cant keep anything down. Pt reports RUQ abd pain when she eats. Denies diarrhea, denies urinary symptoms.    Pt also has wrap to her right hand, states she had surgery there a week ago and has been following up appropriately, states I'm not here for that, I'm here for the vomiting    States she is taking several medications that also make her nauseas(including chemo pills) but she has been unable to keep them down. Doesn't have nausea medicine at home.

## 2019-06-09 NOTE — ED Notes (Signed)
I have reviewed discharge instructions with the patient.  The patient verbalized understanding.

## 2019-06-09 NOTE — ED Provider Notes (Signed)
Barbara [name] is a 53 y.o. female with history of breast cancer, cerebral aneurysm, cholelithiasis with complaints of onset of right upper quadrant pain sharp in nature with associated nausea and vomiting since yesterday with no diarrhea.  Patient states the pain is worse with eating.  She has no cough fever, new headache or numbness or tingling.  No urinary symptoms.  She has not take anything for the pain or the nausea today.    The history is provided by the patient and medical records.        Past Medical History:   Diagnosis Date   ??? Aneurysm ([place]) 2020    right MCA 3-4 mm and left ICA carotid cave 3 mm   ??? Atherosclerosis 2020   ??? Back pain    ??? Breast cancer ([place]) 2008 or 2009   ??? Carotid artery stenosis     2020 Dr. Constance Goltz, plans to repeat US in 2 years    ??? Chronic pain    ??? DDD (degenerative disc disease), cervical     multilevel   ??? Diabetes ([place])    ??? Facet joint disease of cervical region    ??? FH: chemotherapy    ??? Hypertension    ??? Hypertensive retinopathy 2020   ??? Nausea & vomiting    ??? Neuropathy    ??? Peripheral vascular disease ([place]) 2020    LLE Dr. Constance Goltz   ??? Radiation therapy complication 0938    NOT complication, just radiation   ??? Vertebral artery stenosis     2020, per Dr. Constance Goltz - mild       Past Surgical History:   Procedure Laterality Date   ??? BIOPSY BREAST     ??? BIOPSY FINE NEEDLE     ??? HX BREAST AUGMENTATION Right 2010   ??? HX BREAST AUGMENTATION Left 09/17/2017    left breast reconstruction with exchange of tissue expander per breast prostesis and left breast tissue expander performed by [name], MD at [place] [facility]   ??? HX BREAST AUGMENTATION Right 04/01/2018    removal of tissue expanders with placment of right permanent breast prostesis performed by [name], MD at [place]   ??? HX BREAST RECONSTRUCTION Right 2011    per pt - attempted latissimus dorsi flap, then saline implant   ??? HX BREAST RECONSTRUCTION Bilateral 06/10/2017    right breast prosthesis and capsule  removal and bilateral breast tissue  expander reconstruction.  performed by [name], MD at [facility]   ??? HX BREAST RECONSTRUCTION Left 07/09/2017    left breast  EXCISION and closure of wound performed by [name], MD at [facility]   ??? HX BREAST RECONSTRUCTION Bilateral     Tissue expanders, to prepare for implants    ??? HX CESAREAN SECTION     ??? HX LUMBAR DISKECTOMY      herniated disc   ??? HX MASTECTOMY Right 2010    Total mastectomy per pt.     ??? HX MASTECTOMY Left 06/10/2017    left mastectomy,Sentinal node biopsies. performed by [name], MD at [facility] MAIN OR   ??? HX MASTECTOMY Bilateral    ??? HX MASTOPEXY (BREAST LIFT) Left 2011   ??? IMPLANT BREAST SILICONE/EQ Right 1829    post mastectomy   ??? THUMB SUPPORT      thumb surgery to mobilize thumb         Family History:   Problem Relation Age of Onset   ???  Diabetes Mother    ??? No Known Problems Father        Social History     Socioeconomic History   ??? Marital status: MARRIED     Spouse name: Not on file   ??? Number of children: Not on file   ??? Years of education: Not on file   ??? Highest education level: Not on file   Occupational History   ??? Occupation: Psychologist, sport and exercise   Social Needs   ??? Financial resource strain: Not on file   ??? Food insecurity     Worry: Not on file     Inability: Not on file   ??? Transportation needs     Medical: Not on file     Non-medical: Not on file   Tobacco Use   ??? Smoking status: Current Some Day Smoker     Packs/day: 0.50     Years: 15.00     Pack years: 7.50     Types: Cigarettes   ??? Smokeless tobacco: Never Used   ??? Tobacco comment: 5-6 cigs per day patient education given   Substance and Sexual Activity   ??? Alcohol use: No   ??? Drug use: No   ??? Sexual activity: Not on file   Lifestyle   ??? Physical activity     Days per week: Not on file     Minutes per session: Not on file   ??? Stress: Not on file   Relationships   ??? Social Product manager on phone: Not on file     Gets together: Not on file     Attends  religious service: Not on file     Active member of club or organization: Not on file     Attends meetings of clubs or organizations: Not on file     Relationship status: Not on file   ??? Intimate partner violence     Fear of current or ex partner: Not on file     Emotionally abused: Not on file     Physically abused: Not on file     Forced sexual activity: Not on file   Other Topics Concern   ??? Military Service Not Asked   ??? Blood Transfusions Not Asked   ??? Caffeine Concern Not Asked   ??? Occupational Exposure Not Asked   ??? Hobby Hazards Not Asked   ??? Sleep Concern Not Asked   ??? Stress Concern Not Asked   ??? Weight Concern Not Asked   ??? Special Diet Not Asked   ??? Back Care Not Asked   ??? Exercise Not Asked   ??? Bike Helmet Not Asked   ??? Seat Belt Not Asked   ??? Self-Exams Not Asked   Social History Narrative   ??? Not on file         ALLERGIES: Tramadol; Metformin; and Trulicity [dulaglutide]    Review of Systems   Constitutional: Positive for appetite change. Negative for fever.   HENT: Negative for sore throat.    Eyes: Negative for visual disturbance.   Respiratory: Negative for shortness of breath.    Cardiovascular: Negative for chest pain.   Gastrointestinal: Positive for abdominal pain. Negative for blood in stool.   Genitourinary: Negative for difficulty urinating and dysuria.   Musculoskeletal: Negative for gait problem.   Skin: Negative for rash.   Allergic/Immunologic: Negative for immunocompromised state.   Neurological: Negative for syncope.   Psychiatric/Behavioral: Positive for sleep disturbance.  Vitals:    06/09/19 1957   BP: (!) 157/97   Pulse: 90   Resp: 20   Temp: 98.1 ??F (36.7 ??C)   SpO2: 97%   Weight: 61.2 kg (135 lb)   Height: 5' 2"  (1.575 m)            Physical Exam  Vitals signs and nursing note reviewed.   Constitutional:       General: She is in acute distress.      Appearance: She is well-developed. She is ill-appearing. She is not toxic-appearing or diaphoretic.   HENT:      Head:  Normocephalic and atraumatic.      Right Ear: External ear normal.      Left Ear: External ear normal.      Nose: Nose normal.      Mouth/Throat:      Pharynx: Uvula midline.   Eyes:      General: No scleral icterus.     Conjunctiva/sclera: Conjunctivae normal.   Neck:      Musculoskeletal: Neck supple.   Cardiovascular:      Rate and Rhythm: Normal rate and regular rhythm.      Heart sounds: Normal heart sounds.   Pulmonary:      Effort: Pulmonary effort is normal.      Breath sounds: Normal breath sounds.   Abdominal:      General: Abdomen is protuberant.      Palpations: Abdomen is soft. There is no hepatomegaly or splenomegaly.      Tenderness: There is abdominal tenderness in the right upper quadrant. There is guarding. There is no right CVA tenderness, left CVA tenderness or rebound. Negative signs include Murphy's sign and McBurney's sign.   Musculoskeletal:      Right lower leg: No edema.      Left lower leg: No edema.   Skin:     General: Skin is warm and dry.      Capillary Refill: Capillary refill takes less than 2 seconds.   Neurological:      Mental Status: She is alert and oriented to person, place, and time.      Gait: Gait normal.   Psychiatric:         Behavior: Behavior normal.          MDM       Procedures  Vitals:  Patient Vitals for the past 12 hrs:   Temp Pulse Resp BP SpO2   06/09/19 1957 98.1 ??F (36.7 ??C) 90 20 (!) 157/97 97 %         Medications ordered:   Medications   sodium chloride 0.9 % bolus infusion 1,000 mL (1,000 mL IntraVENous New Bag 06/09/19 2105)   ondansetron (ZOFRAN) injection 4 mg (4 mg IntraVENous Given 06/09/19 2105)   famotidine (PF) (PEPCID) injection 20 mg (20 mg IntraVENous Given 06/09/19 2105)   ketorolac (TORADOL) injection 30 mg (30 mg IntraVENous Given 06/09/19 2105)   morphine injection 4 mg (4 mg IntraVENous Given 06/09/19 2105)         Lab findings:  Recent Results (from the past 12 hour(s))   URINALYSIS W/ RFLX MICROSCOPIC    Collection Time: 06/09/19  8:00 PM    Result Value Ref Range    Color YELLOW      Appearance CLEAR      Specific gravity 1.025 1.005 - 1.030      pH (UA) >8.5 (H) 5.0 - 8.0    Protein TRACE (A) NEG mg/dL  Glucose 100 (A) NEG mg/dL    Ketone TRACE (A) NEG mg/dL    Bilirubin Negative NEG      Blood Negative NEG      Urobilinogen 1.0 0.2 - 1.0 EU/dL    Nitrites Negative NEG      Leukocyte Esterase TRACE (A) NEG     URINE MICROSCOPIC ONLY    Collection Time: 06/09/19  8:00 PM   Result Value Ref Range    WBC 0 to 3 0 - 4 /hpf    RBC Negative 0 - 5 /hpf    Epithelial cells 1+ 0 - 5 /lpf    Bacteria 1+ (A) NEG /hpf    Mucus 1+ (A) NEG /lpf   CBC WITH AUTOMATED DIFF    Collection Time: 06/09/19  8:28 PM   Result Value Ref Range    WBC 10.8 4.6 - 13.2 K/uL    RBC 4.69 4.20 - 5.30 M/uL    HGB 15.0 12.0 - 16.0 g/dL    HCT 43.0 35.0 - 45.0 %    MCV 91.7 74.0 - 97.0 FL    MCH 32.0 24.0 - 34.0 PG    MCHC 34.9 31.0 - 37.0 g/dL    RDW 12.7 11.6 - 14.5 %    PLATELET 274 135 - 420 K/uL    MPV 10.2 9.2 - 11.8 FL    NEUTROPHILS 59 40 - 73 %    LYMPHOCYTES 32 21 - 52 %    MONOCYTES 7 3 - 10 %    EOSINOPHILS 2 0 - 5 %    BASOPHILS 0 0 - 2 %    ABS. NEUTROPHILS 6.4 1.8 - 8.0 K/UL    ABS. LYMPHOCYTES 3.4 0.9 - 3.6 K/UL    ABS. MONOCYTES 0.8 0.05 - 1.2 K/UL    ABS. EOSINOPHILS 0.2 0.0 - 0.4 K/UL    ABS. BASOPHILS 0.0 0.0 - 0.1 K/UL    DF AUTOMATED     METABOLIC PANEL, COMPREHENSIVE    Collection Time: 06/09/19  8:28 PM   Result Value Ref Range    Sodium 138 136 - 145 mmol/L    Potassium 3.4 (L) 3.5 - 5.5 mmol/L    Chloride 108 100 - 111 mmol/L    CO2 26 21 - 32 mmol/L    Anion gap 4 3.0 - 18 mmol/L    Glucose 186 (H) 74 - 99 mg/dL    BUN 11 7.0 - 18 MG/DL    Creatinine 0.54 (L) 0.6 - 1.3 MG/DL    BUN/Creatinine ratio 20 12 - 20      GFR est AA >60 >60 ml/min/1.74m    GFR est non-AA >60 >60 ml/min/1.745m   Calcium 9.8 8.5 - 10.1 MG/DL    Bilirubin, total 0.6 0.2 - 1.0 MG/DL    ALT (SGPT) 26 13 - 56 U/L    AST (SGOT) 28 10 - 38 U/L    Alk. phosphatase 134 (H) 45 - 117 U/L     Protein, total 8.6 (H) 6.4 - 8.2 g/dL    Albumin 3.9 3.4 - 5.0 g/dL    Globulin 4.7 (H) 2.0 - 4.0 g/dL    A-G Ratio 0.8 0.8 - 1.7     LIPASE    Collection Time: 06/09/19  8:28 PM   Result Value Ref Range    Lipase 57 (L) 73 - 393 U/L       EKG interpretation by ED Physician:    Pulse ox: 97% room air, normal  X-Ray, CT or other radiology findings or impressions:  [place] ABD LTD    (Results Pending)   Large gallstone with no gallbladder wall thickening or free fluid or signs of biliary obstruction    Progress notes, Consult notes or additional Procedure notes:   Pain has significantly improved.  Abdomen soft and not significantly tender at this point.  Not feel patient requires admission or emergent surgical consult.    I have discussed with patient and/or family/sig other the results, interpretation of any imaging if performed, suspected diagnosis and treatment plan to include instructions regarding the diagnoses listed to which understanding was expressed with all questions answered      Reevaluation of patient:   stable    Disposition:  Diagnosis:   1. Acute abdominal pain in right upper quadrant    2. Cholelithiasis without cholecystitis    3. Non-intractable vomiting with nausea, unspecified vomiting type        Disposition: home    Follow-up Information     Follow up With Specialties Details Why Contact Info    Toniann Ket, NP Nurse Practitioner Schedule an appointment as soon as possible for a visit  [address]  [place]  [place] [address]  [phone]      [facility] EMERGENCY DEPT Emergency Medicine  If symptoms worsen [place], [place], MD General Surgery Schedule an appointment as soon as possible for a visit  [address]  [place]  [place] [address]  [phone]              Patient's Medications   Start Taking    HYDROCODONE-ACETAMINOPHEN (NORCO) 5-325 MG PER TABLET    Take 1 Tab by mouth every six (6) hours as needed for Pain for  up to 3 days. Max Daily Amount: 4 Tabs.    ONDANSETRON (ZOFRAN ODT) 4 MG DISINTEGRATING TABLET    Take 1 Tab by mouth every eight (8) hours as needed for Nausea or Vomiting.   Continue Taking    ALBUTEROL (PROVENTIL HFA, VENTOLIN HFA, PROAIR HFA) 90 MCG/ACTUATION INHALER    Take 2 Puffs by inhalation every six (6) hours as needed for Wheezing.    ALPRAZOLAM (XANAX) 0.5 MG TABLET    Take 0.5 Tabs by mouth two (2) times daily as needed for Anxiety. Max Daily Amount: 0.5 mg.    ATORVASTATIN (LIPITOR) 10 MG TABLET    Take 1 Tab by mouth daily.    CHOLECALCIFEROL (VITAMIN D3) (2,000 UNITS /50 MCG) CAP CAPSULE    Take 2,000 Units by mouth daily.    DICLOFENAC EC (VOLTAREN) 75 MG EC TABLET    Take 1 Tab by mouth two (2) times a day.    FLASH GLUCOSE SCANNING READER (FREESTYLE LIBRE 14 DAY READER) MISC    Used as directed to check blood sugar 4 times a day    FLASH GLUCOSE SENSOR (FREESTYLE LIBRE 14 DAY SENSOR) KIT    Use as directed to check blood sugar 4 times a day    GABAPENTIN (NEURONTIN) 400 MG CAPSULE    800 mg PO TID    GLUCOSE BLOOD VI TEST STRIPS (FREESTYLE LITE STRIPS) STRIP    Use as directed to check blood sugar 4 times a day    INSULIN NPH (NOVOLIN N, HUMULIN N) 100 UNIT/ML INJECTION    50 Units in the AM, 50 Units in the PM    KETOCONAZOLE (NIZORAL) 2 %  TOPICAL CREAM    Apply  to affected area two (2) times a day.    LETROZOLE (FEMARA) 2.5 MG TABLET    Take 2.5 mg by mouth daily.    NYSTATIN (MYCOSTATIN) POWDER    Apply to rash 4 times a day    RAMIPRIL (ALTACE) 10 MG CAPSULE    Take 1 capsule by mouth once daily    TIZANIDINE (ZANAFLEX) 2 MG TABLET    Take 1 Tab by mouth three (3) times daily as needed for Muscle Spasm(s).    VENLAFAXINE-SR (EFFEXOR-XR) 75 MG CAPSULE    TAKE 3 CAPSULES BY MOUTH ONCE DAILY   These Medications have changed    Modified Medication Previous Medication    IBUPROFEN (MOTRIN) 600 MG TABLET ibuprofen (MOTRIN) 600 mg tablet       Take 1 Tab by mouth every six (6) hours as needed for  Pain.    Take 1 Tab by mouth every six (6) hours as needed for Pain.   Stop Taking    No medications on file

## 2019-06-09 NOTE — ED Provider Notes (Signed)
Barbara [name] is a 53 y.o. female with history of breast cancer, cerebral aneurysm, cholelithiasis with complaints of onset of right upper quadrant pain sharp in nature with associated nausea and vomiting since yesterday with no diarrhea.  Patient states the pain is worse with eating.  She has no cough fever, new headache or numbness or tingling.  No urinary symptoms.  She has not take anything for the pain or the nausea today.    The history is provided by the patient and medical records.        Past Medical History:   Diagnosis Date   ??? Aneurysm ([place]) 2020    right MCA 3-4 mm and left ICA carotid cave 3 mm   ??? Atherosclerosis 2020   ??? Back pain    ??? Breast cancer ([place]) 2008 or 2009   ??? Carotid artery stenosis     2020 Dr. Constance Goltz, plans to repeat US in 2 years    ??? Chronic pain    ??? DDD (degenerative disc disease), cervical     multilevel   ??? Diabetes ([place])    ??? Facet joint disease of cervical region    ??? FH: chemotherapy    ??? Hypertension    ??? Hypertensive retinopathy 2020   ??? Nausea & vomiting    ??? Neuropathy    ??? Peripheral vascular disease ([place]) 2020    LLE Dr. Constance Goltz   ??? Radiation therapy complication 6948    NOT complication, just radiation   ??? Vertebral artery stenosis     2020, per Dr. Constance Goltz - mild       Past Surgical History:   Procedure Laterality Date   ??? BIOPSY BREAST     ??? BIOPSY FINE NEEDLE     ??? HX BREAST AUGMENTATION Right 2010   ??? HX BREAST AUGMENTATION Left 09/17/2017    left breast reconstruction with exchange of tissue expander per breast prostesis and left breast tissue expander performed by [name], MD at [place]   ??? HX BREAST AUGMENTATION Right 04/01/2018    removal of tissue expanders with placment of right permanent breast prostesis performed by [name], MD at [facility]   ??? HX BREAST RECONSTRUCTION Right 2011    per pt - attempted latissimus dorsi flap, then saline implant   ??? HX BREAST RECONSTRUCTION Bilateral 06/10/2017     right breast prosthesis and capsule removal and bilateral breast tissue  expander reconstruction.  performed by [name], MD at [facility]   ??? HX BREAST RECONSTRUCTION Left 07/09/2017    left breast  EXCISION and closure of wound performed by [name], MD at [place]   ??? HX BREAST RECONSTRUCTION Bilateral     Tissue expanders, to prepare for implants    ??? HX CESAREAN SECTION     ??? HX LUMBAR DISKECTOMY      herniated disc   ??? HX MASTECTOMY Right 2010    Total mastectomy per pt.     ??? HX MASTECTOMY Left 06/10/2017    left mastectomy,Sentinal node biopsies. performed by [name], MD at [facility] MAIN OR   ??? HX MASTECTOMY Bilateral    ??? HX MASTOPEXY (BREAST LIFT) Left 2011   ??? IMPLANT BREAST SILICONE/EQ Right 5462    post mastectomy   ??? THUMB SUPPORT      thumb surgery to mobilize thumb         Family History:   Problem Relation Age of Onset   ???  Diabetes Mother    ??? No Known Problems Father        Social History     Socioeconomic History   ??? Marital status: MARRIED     Spouse name: Not on file   ??? Number of children: Not on file   ??? Years of education: Not on file   ??? Highest education level: Not on file   Occupational History   ??? Occupation: Psychologist, sport and exercise   Social Needs   ??? Financial resource strain: Not on file   ??? Food insecurity     Worry: Not on file     Inability: Not on file   ??? Transportation needs     Medical: Not on file     Non-medical: Not on file   Tobacco Use   ??? Smoking status: Current Some Day Smoker     Packs/day: 0.50     Years: 15.00     Pack years: 7.50     Types: Cigarettes   ??? Smokeless tobacco: Never Used   ??? Tobacco comment: 5-6 cigs per day patient education given   Substance and Sexual Activity   ??? Alcohol use: No   ??? Drug use: No   ??? Sexual activity: Not on file   Lifestyle   ??? Physical activity     Days per week: Not on file     Minutes per session: Not on file   ??? Stress: Not on file   Relationships   ??? Social Product manager on phone: Not on file      Gets together: Not on file     Attends religious service: Not on file     Active member of club or organization: Not on file     Attends meetings of clubs or organizations: Not on file     Relationship status: Not on file   ??? Intimate partner violence     Fear of current or ex partner: Not on file     Emotionally abused: Not on file     Physically abused: Not on file     Forced sexual activity: Not on file   Other Topics Concern   ??? Military Service Not Asked   ??? Blood Transfusions Not Asked   ??? Caffeine Concern Not Asked   ??? Occupational Exposure Not Asked   ??? Hobby Hazards Not Asked   ??? Sleep Concern Not Asked   ??? Stress Concern Not Asked   ??? Weight Concern Not Asked   ??? Special Diet Not Asked   ??? Back Care Not Asked   ??? Exercise Not Asked   ??? Bike Helmet Not Asked   ??? Seat Belt Not Asked   ??? Self-Exams Not Asked   Social History Narrative   ??? Not on file         ALLERGIES: Tramadol; Metformin; and Trulicity [dulaglutide]    Review of Systems   Constitutional: Positive for appetite change. Negative for fever.   HENT: Negative for sore throat.    Eyes: Negative for visual disturbance.   Respiratory: Negative for shortness of breath.    Cardiovascular: Negative for chest pain.   Gastrointestinal: Positive for abdominal pain. Negative for blood in stool.   Genitourinary: Negative for difficulty urinating and dysuria.   Musculoskeletal: Negative for gait problem.   Skin: Negative for rash.   Allergic/Immunologic: Negative for immunocompromised state.   Neurological: Negative for syncope.   Psychiatric/Behavioral: Positive for sleep disturbance.  Vitals:    06/09/19 1957   BP: (!) 157/97   Pulse: 90   Resp: 20   Temp: 98.1 ??F (36.7 ??C)   SpO2: 97%   Weight: 61.2 kg (135 lb)   Height: 5' 2"  (1.575 m)            Physical Exam  Vitals signs and nursing note reviewed.   Constitutional:       General: She is in acute distress.      Appearance: She is well-developed. She is ill-appearing. She is not  toxic-appearing or diaphoretic.   HENT:      Head: Normocephalic and atraumatic.      Right Ear: External ear normal.      Left Ear: External ear normal.      Nose: Nose normal.      Mouth/Throat:      Pharynx: Uvula midline.   Eyes:      General: No scleral icterus.     Conjunctiva/sclera: Conjunctivae normal.   Neck:      Musculoskeletal: Neck supple.   Cardiovascular:      Rate and Rhythm: Normal rate and regular rhythm.      Heart sounds: Normal heart sounds.   Pulmonary:      Effort: Pulmonary effort is normal.      Breath sounds: Normal breath sounds.   Abdominal:      General: Abdomen is protuberant.      Palpations: Abdomen is soft. There is no hepatomegaly or splenomegaly.      Tenderness: There is abdominal tenderness in the right upper quadrant. There is guarding. There is no right CVA tenderness, left CVA tenderness or rebound. Negative signs include Murphy's sign and McBurney's sign.   Musculoskeletal:      Right lower leg: No edema.      Left lower leg: No edema.   Skin:     General: Skin is warm and dry.      Capillary Refill: Capillary refill takes less than 2 seconds.   Neurological:      Mental Status: She is alert and oriented to person, place, and time.      Gait: Gait normal.   Psychiatric:         Behavior: Behavior normal.          MDM       Procedures  Vitals:  Patient Vitals for the past 12 hrs:   Temp Pulse Resp BP SpO2   06/09/19 1957 98.1 ??F (36.7 ??C) 90 20 (!) 157/97 97 %         Medications ordered:   Medications   sodium chloride 0.9 % bolus infusion 1,000 mL (1,000 mL IntraVENous New Bag 06/09/19 2105)   ondansetron (ZOFRAN) injection 4 mg (4 mg IntraVENous Given 06/09/19 2105)   famotidine (PF) (PEPCID) injection 20 mg (20 mg IntraVENous Given 06/09/19 2105)   ketorolac (TORADOL) injection 30 mg (30 mg IntraVENous Given 06/09/19 2105)   morphine injection 4 mg (4 mg IntraVENous Given 06/09/19 2105)         Lab findings:  Recent Results (from the past 12 hour(s))    URINALYSIS W/ RFLX MICROSCOPIC    Collection Time: 06/09/19  8:00 PM   Result Value Ref Range    Color YELLOW      Appearance CLEAR      Specific gravity 1.025 1.005 - 1.030      pH (UA) >8.5 (H) 5.0 - 8.0    Protein TRACE (A) NEG mg/dL  Glucose 100 (A) NEG mg/dL    Ketone TRACE (A) NEG mg/dL    Bilirubin Negative NEG      Blood Negative NEG      Urobilinogen 1.0 0.2 - 1.0 EU/dL    Nitrites Negative NEG      Leukocyte Esterase TRACE (A) NEG     URINE MICROSCOPIC ONLY    Collection Time: 06/09/19  8:00 PM   Result Value Ref Range    WBC 0 to 3 0 - 4 /hpf    RBC Negative 0 - 5 /hpf    Epithelial cells 1+ 0 - 5 /lpf    Bacteria 1+ (A) NEG /hpf    Mucus 1+ (A) NEG /lpf   CBC WITH AUTOMATED DIFF    Collection Time: 06/09/19  8:28 PM   Result Value Ref Range    WBC 10.8 4.6 - 13.2 K/uL    RBC 4.69 4.20 - 5.30 M/uL    HGB 15.0 12.0 - 16.0 g/dL    HCT 43.0 35.0 - 45.0 %    MCV 91.7 74.0 - 97.0 FL    MCH 32.0 24.0 - 34.0 PG    MCHC 34.9 31.0 - 37.0 g/dL    RDW 12.7 11.6 - 14.5 %    PLATELET 274 135 - 420 K/uL    MPV 10.2 9.2 - 11.8 FL    NEUTROPHILS 59 40 - 73 %    LYMPHOCYTES 32 21 - 52 %    MONOCYTES 7 3 - 10 %    EOSINOPHILS 2 0 - 5 %    BASOPHILS 0 0 - 2 %    ABS. NEUTROPHILS 6.4 1.8 - 8.0 K/UL    ABS. LYMPHOCYTES 3.4 0.9 - 3.6 K/UL    ABS. MONOCYTES 0.8 0.05 - 1.2 K/UL    ABS. EOSINOPHILS 0.2 0.0 - 0.4 K/UL    ABS. BASOPHILS 0.0 0.0 - 0.1 K/UL    DF AUTOMATED     METABOLIC PANEL, COMPREHENSIVE    Collection Time: 06/09/19  8:28 PM   Result Value Ref Range    Sodium 138 136 - 145 mmol/L    Potassium 3.4 (L) 3.5 - 5.5 mmol/L    Chloride 108 100 - 111 mmol/L    CO2 26 21 - 32 mmol/L    Anion gap 4 3.0 - 18 mmol/L    Glucose 186 (H) 74 - 99 mg/dL    BUN 11 7.0 - 18 MG/DL    Creatinine 0.54 (L) 0.6 - 1.3 MG/DL    BUN/Creatinine ratio 20 12 - 20      GFR est AA >60 >60 ml/min/1.49m    GFR est non-AA >60 >60 ml/min/1.73m   Calcium 9.8 8.5 - 10.1 MG/DL    Bilirubin, total 0.6 0.2 - 1.0 MG/DL    ALT (SGPT) 26 13 - 56 U/L     AST (SGOT) 28 10 - 38 U/L    Alk. phosphatase 134 (H) 45 - 117 U/L    Protein, total 8.6 (H) 6.4 - 8.2 g/dL    Albumin 3.9 3.4 - 5.0 g/dL    Globulin 4.7 (H) 2.0 - 4.0 g/dL    A-G Ratio 0.8 0.8 - 1.7     LIPASE    Collection Time: 06/09/19  8:28 PM   Result Value Ref Range    Lipase 57 (L) 73 - 393 U/L       EKG interpretation by ED Physician:    Pulse ox: 97% room air, normal  X-Ray, CT or other radiology findings or impressions:  [place] ABD LTD    (Results Pending)   Large gallstone with no gallbladder wall thickening or free fluid or signs of biliary obstruction    Progress notes, Consult notes or additional Procedure notes:   Pain has significantly improved.  Abdomen soft and not significantly tender at this point.  Not feel patient requires admission or emergent surgical consult.    I have discussed with patient and/or family/sig other the results, interpretation of any imaging if performed, suspected diagnosis and treatment plan to include instructions regarding the diagnoses listed to which understanding was expressed with all questions answered      Reevaluation of patient:   stable    Disposition:  Diagnosis:   1. Acute abdominal pain in right upper quadrant    2. Cholelithiasis without cholecystitis    3. Non-intractable vomiting with nausea, unspecified vomiting type        Disposition: home    Follow-up Information     Follow up With Specialties Details Why Contact Info    Toniann Ket, NP Nurse Practitioner Schedule an appointment as soon as possible for a visit  [address]  [place]  [place] [address]  [phone]      [facility] EMERGENCY DEPT Emergency Medicine  If symptoms worsen [place]on, [place], MD General Surgery Schedule an appointment as soon as possible for a visit  [address]  [place]  [place] [address]  [phone]              Patient's Medications   Start Taking     HYDROCODONE-ACETAMINOPHEN (NORCO) 5-325 MG PER TABLET    Take 1 Tab by mouth every six (6) hours as needed for Pain for up to 3 days. Max Daily Amount: 4 Tabs.    ONDANSETRON (ZOFRAN ODT) 4 MG DISINTEGRATING TABLET    Take 1 Tab by mouth every eight (8) hours as needed for Nausea or Vomiting.   Continue Taking    ALBUTEROL (PROVENTIL HFA, VENTOLIN HFA, PROAIR HFA) 90 MCG/ACTUATION INHALER    Take 2 Puffs by inhalation every six (6) hours as needed for Wheezing.    ALPRAZOLAM (XANAX) 0.5 MG TABLET    Take 0.5 Tabs by mouth two (2) times daily as needed for Anxiety. Max Daily Amount: 0.5 mg.    ATORVASTATIN (LIPITOR) 10 MG TABLET    Take 1 Tab by mouth daily.    CHOLECALCIFEROL (VITAMIN D3) (2,000 UNITS /50 MCG) CAP CAPSULE    Take 2,000 Units by mouth daily.    DICLOFENAC EC (VOLTAREN) 75 MG EC TABLET    Take 1 Tab by mouth two (2) times a day.    FLASH GLUCOSE SCANNING READER (FREESTYLE LIBRE 14 DAY READER) MISC    Used as directed to check blood sugar 4 times a day    FLASH GLUCOSE SENSOR (FREESTYLE LIBRE 14 DAY SENSOR) KIT    Use as directed to check blood sugar 4 times a day    GABAPENTIN (NEURONTIN) 400 MG CAPSULE    800 mg PO TID    GLUCOSE BLOOD VI TEST STRIPS (FREESTYLE LITE STRIPS) STRIP    Use as directed to check blood sugar 4 times a day    INSULIN NPH (NOVOLIN N, HUMULIN N) 100 UNIT/ML INJECTION    50 Units in the AM, 50 Units in the PM    KETOCONAZOLE (NIZORAL) 2 %  TOPICAL CREAM    Apply  to affected area two (2) times a day.    LETROZOLE (FEMARA) 2.5 MG TABLET    Take 2.5 mg by mouth daily.    NYSTATIN (MYCOSTATIN) POWDER    Apply to rash 4 times a day    RAMIPRIL (ALTACE) 10 MG CAPSULE    Take 1 capsule by mouth once daily    TIZANIDINE (ZANAFLEX) 2 MG TABLET    Take 1 Tab by mouth three (3) times daily as needed for Muscle Spasm(s).    VENLAFAXINE-SR (EFFEXOR-XR) 75 MG CAPSULE    TAKE 3 CAPSULES BY MOUTH ONCE DAILY   These Medications have changed    Modified Medication Previous Medication     IBUPROFEN (MOTRIN) 600 MG TABLET ibuprofen (MOTRIN) 600 mg tablet       Take 1 Tab by mouth every six (6) hours as needed for Pain.    Take 1 Tab by mouth every six (6) hours as needed for Pain.   Stop Taking    No medications on file

## 2019-06-09 NOTE — ED Triage Notes (Addendum)
Pt to triage c/o N/V x 2 days. States she cant keep anything down. Pt reports RUQ abd pain when she eats. Denies diarrhea, denies urinary symptoms.    Pt also has wrap to her right hand, states she had surgery there a week ago and has been following up appropriately, states "I'm not here for that, I'm here for the vomiting"    States she is taking several medications that also make her nauseas(including chemo pills) but she has been unable to keep them down. Doesn't have nausea medicine at home.

## 2019-06-10 LAB — CBC WITH AUTOMATED DIFF
ABS. BASOPHILS: 0 10*3/uL (ref 0.0–0.1)
ABS. EOSINOPHILS: 0.2 10*3/uL (ref 0.0–0.4)
ABS. LYMPHOCYTES: 3.4 10*3/uL (ref 0.9–3.6)
ABS. MONOCYTES: 0.8 10*3/uL (ref 0.05–1.2)
ABS. NEUTROPHILS: 6.4 10*3/uL (ref 1.8–8.0)
BASOPHILS: 0 % (ref 0–2)
EOSINOPHILS: 2 % (ref 0–5)
HCT: 43 % (ref 35.0–45.0)
HGB: 15 g/dL (ref 12.0–16.0)
LYMPHOCYTES: 32 % (ref 21–52)
MCH: 32 PG (ref 24.0–34.0)
MCHC: 34.9 g/dL (ref 31.0–37.0)
MCV: 91.7 FL (ref 74.0–97.0)
MONOCYTES: 7 % (ref 3–10)
MPV: 10.2 FL (ref 9.2–11.8)
NEUTROPHILS: 59 % (ref 40–73)
PLATELET: 274 10*3/uL (ref 135–420)
RBC: 4.69 M/uL (ref 4.20–5.30)
RDW: 12.7 % (ref 11.6–14.5)
WBC: 10.8 10*3/uL (ref 4.6–13.2)

## 2019-06-10 LAB — URINALYSIS W/ RFLX MICROSCOPIC
Bilirubin, Urine: NEGATIVE
Bilirubin: NEGATIVE
Blood, Urine: NEGATIVE
Blood: NEGATIVE
Glucose, Ur: 100 mg/dL — AB
Glucose: 100 mg/dL — AB
Nitrite, Urine: NEGATIVE
Nitrites: NEGATIVE
Specific Gravity, UA: 1.025 (ref 1.005–1.030)
Specific gravity: 1.025 (ref 1.005–1.030)
Urobilinogen, UA, POCT: 1 EU/dL (ref 0.2–1.0)
Urobilinogen: 1 EU/dL (ref 0.2–1.0)
pH (UA): >8.5 — ABNORMAL HIGH (ref 5.0–8.0)
pH, UA: >8.5 NA — ABNORMAL HIGH (ref 5.0–8.0)

## 2019-06-10 LAB — METABOLIC PANEL, COMPREHENSIVE
A-G Ratio: 0.8 (ref 0.8–1.7)
ALT (SGPT): 26 U/L (ref 13–56)
AST (SGOT): 28 U/L (ref 10–38)
Albumin: 3.9 g/dL (ref 3.4–5.0)
Alk. phosphatase: 134 U/L — ABNORMAL HIGH (ref 45–117)
Anion gap: 4 mmol/L (ref 3.0–18)
BUN/Creatinine ratio: 20 (ref 12–20)
BUN: 11 MG/DL (ref 7.0–18)
Bilirubin, total: 0.6 MG/DL (ref 0.2–1.0)
CO2: 26 mmol/L (ref 21–32)
Calcium: 9.8 MG/DL (ref 8.5–10.1)
Chloride: 108 mmol/L (ref 100–111)
Creatinine: 0.54 MG/DL — ABNORMAL LOW (ref 0.6–1.3)
GFR est AA: >60 mL/min/{1.73_m2} (ref >60)
GFR est non-AA: >60 mL/min/{1.73_m2} (ref >60)
Globulin: 4.7 g/dL — ABNORMAL HIGH (ref 2.0–4.0)
Glucose: 186 mg/dL — ABNORMAL HIGH (ref 74–99)
Potassium: 3.4 mmol/L — ABNORMAL LOW (ref 3.5–5.5)
Protein, total: 8.6 g/dL — ABNORMAL HIGH (ref 6.4–8.2)
Sodium: 138 mmol/L (ref 136–145)

## 2019-06-10 LAB — URINE MICROSCOPIC ONLY
RBC, UA: NEGATIVE /hpf (ref 0–5)
RBC: NEGATIVE /hpf (ref 0–5)
WBC, UA: 0 /hpf (ref 0–4)
WBC: 0 /hpf (ref 0–4)

## 2019-06-10 LAB — LIPASE
Lipase: 57 U/L — ABNORMAL LOW (ref 73–393)
Lipase: 57 U/L — ABNORMAL LOW (ref 73–393)

## 2019-06-10 LAB — COMPREHENSIVE METABOLIC PANEL
ALT: 26 U/L (ref 13–56)
AST: 28 U/L (ref 10–38)
Albumin/Globulin Ratio: 0.8 (ref 0.8–1.7)
Albumin: 3.9 g/dL (ref 3.4–5.0)
Alkaline Phosphatase: 134 U/L — ABNORMAL HIGH (ref 45–117)
Anion Gap: 4 mmol/L (ref 3.0–18)
BUN: 11 MG/DL (ref 7.0–18)
Bun/Cre Ratio: 20 (ref 12–20)
CO2: 26 mmol/L (ref 21–32)
Calcium: 9.8 MG/DL (ref 8.5–10.1)
Chloride: 108 mmol/L (ref 100–111)
Creatinine: 0.54 MG/DL — ABNORMAL LOW (ref 0.6–1.3)
EGFR IF NonAfrican American: >60 mL/min/{1.73_m2} (ref >60)
GFR African American: >60 mL/min/{1.73_m2} (ref >60)
Globulin: 4.7 g/dL — ABNORMAL HIGH (ref 2.0–4.0)
Glucose: 186 mg/dL — ABNORMAL HIGH (ref 74–99)
Potassium: 3.4 mmol/L — ABNORMAL LOW (ref 3.5–5.5)
Sodium: 138 mmol/L (ref 136–145)
Total Bilirubin: 0.6 MG/DL (ref 0.2–1.0)
Total Protein: 8.6 g/dL — ABNORMAL HIGH (ref 6.4–8.2)

## 2019-06-10 LAB — CBC WITH AUTO DIFFERENTIAL
Basophils %: 0 % (ref 0–2)
Basophils Absolute: 0 10*3/uL (ref 0.0–0.1)
Eosinophils %: 2 % (ref 0–5)
Eosinophils Absolute: 0.2 10*3/uL (ref 0.0–0.4)
Hematocrit: 43 % (ref 35.0–45.0)
Hemoglobin: 15 g/dL (ref 12.0–16.0)
Lymphocytes %: 32 % (ref 21–52)
Lymphocytes Absolute: 3.4 10*3/uL (ref 0.9–3.6)
MCH: 32 PG (ref 24.0–34.0)
MCHC: 34.9 g/dL (ref 31.0–37.0)
MCV: 91.7 FL (ref 74.0–97.0)
MPV: 10.2 FL (ref 9.2–11.8)
Monocytes %: 7 % (ref 3–10)
Monocytes Absolute: 0.8 10*3/uL (ref 0.05–1.2)
Neutrophils %: 59 % (ref 40–73)
Neutrophils Absolute: 6.4 10*3/uL (ref 1.8–8.0)
Platelets: 274 10*3/uL (ref 135–420)
RBC: 4.69 M/uL (ref 4.20–5.30)
RDW: 12.7 % (ref 11.6–14.5)
WBC: 10.8 10*3/uL (ref 4.6–13.2)

## 2019-06-10 MED ORDER — SODIUM CHLORIDE 0.9% BOLUS IV
0.9 % | Freq: Once | INTRAVENOUS | Status: AC
Start: 2019-06-10 — End: 2019-06-09
  Administered 2019-06-10: 01:00:00 via INTRAVENOUS

## 2019-06-10 MED ORDER — FAMOTIDINE (PF) 20 MG/2 ML IV
20 mg/2 mL | INTRAVENOUS | Status: AC
Start: 2019-06-10 — End: 2019-06-09
  Administered 2019-06-10: 01:00:00 via INTRAVENOUS

## 2019-06-10 MED ORDER — ONDANSETRON (PF) 4 MG/2 ML INJECTION
4 mg/2 mL | INTRAMUSCULAR | Status: AC
Start: 2019-06-10 — End: 2019-06-09
  Administered 2019-06-10: 01:00:00 via INTRAVENOUS

## 2019-06-10 MED ORDER — MORPHINE 2 MG/ML INJECTION
2 mg/mL | Freq: Once | INTRAMUSCULAR | Status: AC
Start: 2019-06-10 — End: 2019-06-09
  Administered 2019-06-10: 01:00:00 via INTRAVENOUS

## 2019-06-10 MED ORDER — MORPHINE 4 MG/ML INTRAVENOUS SOLUTION
4 mg/mL | INTRAVENOUS | Status: DC
Start: 2019-06-10 — End: 2019-06-09

## 2019-06-10 MED ORDER — KETOROLAC TROMETHAMINE 30 MG/ML INJECTION
30 mg/mL (1 mL) | INTRAMUSCULAR | Status: AC
Start: 2019-06-10 — End: 2019-06-09
  Administered 2019-06-10: 01:00:00 via INTRAVENOUS

## 2019-06-10 MED ORDER — HYDROCODONE-ACETAMINOPHEN 5 MG-325 MG TAB
5-325 mg | ORAL_TABLET | Freq: Four times a day (QID) | ORAL | 0 refills | Status: AC | PRN
Start: 2019-06-10 — End: 2019-06-12

## 2019-06-10 MED ORDER — IBUPROFEN 600 MG TAB
600 mg | ORAL_TABLET | Freq: Four times a day (QID) | ORAL | 0 refills | Status: AC | PRN
Start: 2019-06-10 — End: ?

## 2019-06-10 MED ORDER — ONDANSETRON 4 MG TAB, RAPID DISSOLVE
4 mg | ORAL_TABLET | Freq: Three times a day (TID) | ORAL | 0 refills | Status: AC | PRN
Start: 2019-06-10 — End: ?

## 2019-06-10 MED FILL — MORPHINE 4 MG/ML INTRAVENOUS SOLUTION: 4 mg/mL | INTRAVENOUS | Qty: 1

## 2019-06-10 MED FILL — SODIUM CHLORIDE 0.9 % IV: INTRAVENOUS | Qty: 1000

## 2019-06-10 MED FILL — ONDANSETRON (PF) 4 MG/2 ML INJECTION: 4 mg/2 mL | INTRAMUSCULAR | Qty: 2

## 2019-06-10 MED FILL — FAMOTIDINE (PF) 20 MG/2 ML IV: 20 mg/2 mL | INTRAVENOUS | Qty: 2

## 2019-06-10 MED FILL — MORPHINE 2 MG/ML INJECTION: 2 mg/mL | INTRAMUSCULAR | Qty: 2

## 2019-06-10 MED FILL — KETOROLAC TROMETHAMINE 30 MG/ML INJECTION: 30 mg/mL (1 mL) | INTRAMUSCULAR | Qty: 1

## 2019-06-10 NOTE — Telephone Encounter (Signed)
Date/Time:  06/10/2019 10:45 AM  Attempted to reach patient by telephone. Left HIPPA compliant message requesting a return call. Will attempt to reach patient again.

## 2019-06-11 NOTE — Telephone Encounter (Signed)
Date/Time:  06/11/2019 10:19 AM  Attempted to reach patient by telephone. Left HIPPA compliant message requesting a return call.

## 2019-06-14 NOTE — Telephone Encounter (Signed)
Patient resolved from Transition of Care episode on 06/14/2019  Discussed COVID-19 related testing which was not done at this time. Test results were not done. Patient informed of results, if available? Test not done     Called patient twice requesting a return call patient never returned my call

## 2019-06-28 ENCOUNTER — Encounter

## 2019-06-28 MED ORDER — OMEGA-3 ACID ETHYL ESTERS 1 GRAM CAP
1 gram | ORAL_CAPSULE | Freq: Two times a day (BID) | ORAL | 11 refills | Status: AC
Start: 2019-06-28 — End: 2020-06-22

## 2019-07-03 ENCOUNTER — Encounter

## 2019-07-03 MED ORDER — VENLAFAXINE SR 75 MG 24 HR CAP
75 mg | ORAL_CAPSULE | ORAL | 0 refills | Status: DC
Start: 2019-07-03 — End: 2019-08-09

## 2019-07-07 NOTE — Telephone Encounter (Signed)
Pt called in and was wanting to know if she could get a orthopedic doctor here in [place] so she doesn't have to keep paying tolls. Please advise.

## 2019-07-07 NOTE — Telephone Encounter (Signed)
More information needed. Last ortho referral placed on 08/21/2017 and closed on 11/10/2017. Who is patient currently seeing and why?

## 2019-07-08 NOTE — Telephone Encounter (Signed)
Pt requesting a referral to orthopedics for her neck. Last appt per patient was in July or August. Please advise.

## 2019-07-09 ENCOUNTER — Encounter

## 2019-07-09 NOTE — Telephone Encounter (Signed)
I put in the new referral

## 2019-07-15 NOTE — Telephone Encounter (Signed)
Pt referred  [facility]  [address],   [place], [place] [address]  f [phone]  t [phone] patient line  t [phone] provider line

## 2019-07-19 NOTE — Telephone Encounter (Signed)
Returned patients call LVM.

## 2019-07-19 NOTE — Telephone Encounter (Signed)
Pt called in and informed me that she needs a call back with some advice on what she can do for her neck pain. She stated she can barely mover her head without being in pain. Please advise.

## 2019-07-21 ENCOUNTER — Emergency Department: Admit: 2019-07-21 | Payer: PRIVATE HEALTH INSURANCE | Primary: Nurse Practitioner

## 2019-07-21 ENCOUNTER — Inpatient Hospital Stay
Admit: 2019-07-21 | Discharge: 2019-07-21 | Disposition: A | Discharge status: Home / Self Care | Payer: PRIVATE HEALTH INSURANCE | Attending: Emergency Medicine

## 2019-07-21 DIAGNOSIS — R519 Headache, unspecified: Secondary | ICD-10-CM

## 2019-07-21 NOTE — ED Provider Notes (Signed)
ED Provider Notes by Barbara Saucer, PA-C at 07/21/19 1103                Author: August Saucer, PA-C  Service: --  Author Type: Physician Assistant       Filed: 07/21/19 1211  Date of Service: 07/21/19 1103  Status: Attested           Editor: Barbara [name] (Physician Assistant)  Cosigner: Barbara Masters, MD at 07/21/19 1305          Attestation signed by Barbara Masters, MD at 07/21/19 1305          I was present for the entirety of this patient's stay in the emergency department available for consultation by the midlevel provider reviewed and I agree with  history physical assessment and plan      Barbara Killian, MD                                 EMERGENCY DEPARTMENT HISTORY AND PHYSICAL EXAM      11:03 AM         Date: 07/21/2019   Patient Name: Barbara [name]        History of Presenting Illness          Chief Complaint       Patient presents with        ?  Neck Pain        ?  Headache           History Provided By: Patient      Chief Complaint: headache, neck pain   Duration: 3 Weeks   Timing:  Acute   Location:    Quality: Aching   Severity: Moderate   Modifying Factors: none   Associated Symptoms: denies any other associated signs or symptoms         Additional History (Context):Barbara [name]  is a 53 y.o. 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  Patient reports she was diagnosed 1 month ago with the aneurysms.  Patient states she had her MRA in the first place due to chronic headaches.  Patient  denies any recent change in vision, nausea, vomiting, URI symptoms, cough, chest pain, shortness of breath, fever, chills, weakness, numbness,  tingling, back pain, perianal numbness or incontinence of bowels or bladder, or any other complaints.  No treatments  prior to arrival.  Patient reports symptoms are better when she hangs her head forward.         PCP:  Barbara Ket, NP              Current Outpatient Medications          Medication  Sig  Dispense  Refill           ?  venlafaxine-SR (EFFEXOR-XR) 75 mg capsule  TAKE 3 CAPSULES BY MOUTH ONCE DAILY  90 Cap  0     ?  omega-3 acid ethyl esters (Lovaza) 1 gram capsule  Take 2 Caps by mouth two (2) times a day for 360 days.  120 Cap  11     ?  ibuprofen (MOTRIN) 600 mg tablet  Take 1 Tab by mouth every six (6) hours as needed for Pain.  20 Tab  0     ?  ondansetron (Zofran ODT) 4 mg disintegrating tablet  Take 1 Tab by mouth every eight (8) hours as needed for Nausea or Vomiting.  8 Tab  0     ?  insulin NPH (NOVOLIN N, HUMULIN N) 100 unit/mL injection  50 Units in the AM, 50 Units in the PM  1 Vial  5     ?  gabapentin (NEURONTIN) 400 mg capsule  800 mg PO TID  180 Cap  3     ?  ALPRAZolam (Xanax) 0.5 mg tablet  Take 0.5 Tabs by mouth two (2) times daily as needed for Anxiety. Max Daily Amount: 0.5 mg.  30 Tab  2     ?  tiZANidine (ZANAFLEX) 2 mg tablet  Take 1 Tab by mouth three (3) times daily as needed for Muscle Spasm(s).  30 Tab  2     ?  glucose blood VI test strips (FreeStyle Lite Strips) strip  Use as directed to check blood sugar 4 times a day  100 Strip  1     ?  atorvastatin (LIPITOR) 10 mg tablet  Take 1 Tab by mouth daily.  90 Tab  1     ?  ramipriL (ALTACE) 10 mg capsule  Take 1 capsule by mouth once daily  90 Cap  1     ?  cholecalciferol (VITAMIN D3) (2,000 UNITS /50 MCG) cap capsule  Take 2,000 Units by mouth daily.  90 Cap  3     ?  flash glucose scanning reader (FreeStyle Libre 14 Day Reader) misc  Used as directed to check blood sugar 4 times a day  1 Each  0     ?  flash glucose sensor (FreeStyle Libre 14 Day Sensor) kit  Use as directed to check blood sugar 4 times a day  1 Kit   5     ?  diclofenac EC (VOLTAREN) 75 mg EC tablet  Take 1 Tab by mouth two (2) times a day.  30 Tab  3     ?  albuterol (PROVENTIL HFA, VENTOLIN HFA, PROAIR HFA) 90 mcg/actuation inhaler  Take 2 Puffs by inhalation every six (6) hours as needed for Wheezing.  1 Inhaler  3     ?  nystatin (MYCOSTATIN) powder  Apply to rash 4 times a day  60 g  3     ?  ketoconazole (NIZORAL) 2 % topical cream  Apply  to affected area two (2) times a day.  60 g  3           ?  letrozole (FEMARA) 2.5 mg tablet  Take 2.5 mg by mouth daily.                 Past History        Past Medical History:     Past Medical History:        Diagnosis  Date         ?  Aneurysm ([facility])  2020          right MCA 3-4 mm and left ICA carotid cave 3 mm         ?  Atherosclerosis  2020     ?  Back pain       ?  Breast cancer ([facility] [facility])  2008 or  2009     ?  Carotid artery stenosis            2020 Dr. Constance Goltz, plans to repeat US in 2 years          ?  Chronic pain       ?  DDD (degenerative disc disease), cervical            multilevel         ?  Diabetes ([facility])       ?  Facet joint disease of cervical region       ?  FH: chemotherapy       ?  Hypertension       ?  Hypertensive retinopathy  2020     ?  Nausea & vomiting       ?  Neuropathy       ?  Peripheral vascular disease ([facility])  2020          LLE Dr. Constance Goltz         ?  Radiation therapy complication  1610          NOT complication, just radiation         ?  Vertebral artery stenosis            2020, per Dr. Constance Goltz - mild           Past Surgical History:     Past Surgical History:         Procedure  Laterality  Date          ?  BIOPSY BREAST         ?  BIOPSY FINE NEEDLE         ?  HX BREAST AUGMENTATION  Right  2010     ?  HX BREAST AUGMENTATION  Left  09/17/2017          left breast reconstruction with exchange of tissue expander per breast prostesis and left breast tissue expander performed by [name],  MD at [place]          ?  HX BREAST AUGMENTATION  Right  04/01/2018          removal of tissue  expanders with placment of right permanent breast prostesis performed by [name], MD at [facility]          ?  HX BREAST RECONSTRUCTION  Right  2011          per pt - attempted latissimus dorsi flap, then saline implant          ?  HX BREAST RECONSTRUCTION  Bilateral  06/10/2017          right breast prosthesis and capsule removal and bilateral breast tissue  expander reconstruction.  performed by [name], MD at [place]          ?  HX BREAST RECONSTRUCTION  Left  07/09/2017          left breast  EXCISION and closure of wound performed by [name], MD at [facility]          ?  HX BREAST RECONSTRUCTION  Bilateral            Tissue expanders, to prepare for implants           ?  HX CESAREAN SECTION         ?  HX LUMBAR DISKECTOMY  herniated disc          ?  HX MASTECTOMY  Right  2010          Total mastectomy per pt.            ?  HX MASTECTOMY  Left  06/10/2017          left mastectomy,Sentinal node biopsies. performed by [name], MD at [place]          ?  HX MASTECTOMY  Bilateral            ?  HX MASTOPEXY (BREAST LIFT)  Left  2011     ?  IMPLANT BREAST SILICONE/EQ  Right  2725          post mastectomy          ?  THUMB SUPPORT              thumb surgery to mobilize thumb           Family History:     Family History         Problem  Relation  Age of Onset          ?  Diabetes  Mother            ?  No Known Problems  Father             Social History:     Social History          Tobacco Use         ?  Smoking status:  Current Some Day Smoker              Packs/day:  0.50         Years:  15.00         Pack years:  7.50         Types:  Cigarettes         ?  Smokeless tobacco:  Never Used        ?  Tobacco comment: 5-6 cigs per day patient education given       Substance Use Topics         ?  Alcohol use:  No         ?  Drug use:  No           Allergies:     Allergies        Allergen  Reactions         ?  Tramadol  Anaphylaxis     ?  Metformin  Diarrhea          ?  Trulicity [Dulaglutide]  Nausea Only                Review of Systems           Review of Systems    Constitutional: Negative for chills and fever.    HENT: Negative for congestion, rhinorrhea and sore throat.     Eyes: Negative for photophobia and visual disturbance.    Respiratory: Negative for cough and shortness of breath.     Cardiovascular: Negative for chest pain.    Gastrointestinal: Negative for abdominal pain, blood in stool, constipation, diarrhea, nausea and vomiting.    Genitourinary: Negative for dysuria, frequency and hematuria.    Musculoskeletal: Positive for neck pain. Negative for back pain and myalgias.    Skin: Negative for rash and wound.    Neurological: Positive for headaches.  Negative for dizziness.    [name] other systems reviewed and are negative.              Physical Exam        Visit Vitals      BP  (!) 156/96 (BP 1 Location: Left arm, BP Patient Position: At rest)     Pulse  79     Temp  97.9 ??F (36.6 ??C)     Resp  18     Ht  5' 2" (1.575 m)     Wt  61.2 kg (135 lb)     SpO2  99%        BMI  24.69 kg/m??           Physical Exam   Vitals signs and nursing note reviewed.   Constitutional:        General: She is not in acute distress.     Appearance: She is well-developed. She is not diaphoretic.      Comments: Well-appearing, nontoxic    HENT :       Head: Normocephalic and atraumatic.   Eyes :       Conjunctiva/sclera: Conjunctivae normal.   Neck :       Musculoskeletal: Normal range of motion and neck supple.   Cardiovascular :       Rate and Rhythm: Normal rate and regular rhythm.      Heart sounds: Normal heart sounds.    Pulmonary:       Effort: Pulmonary effort is normal. No respiratory distress.      Breath sounds: Normal breath sounds. No stridor. No wheezing, rhonchi  or rales.   Chest :       Chest wall: No tenderness.   Abdominal :      Tenderness: There is no rebound.     Musculoskeletal:          General: Tenderness present. No deformity.      Comments: Tender to palpation   along bilateral cervical paraspinal muscles.  Decreased range of motion of head secondary to pain.    Skin:      General: Skin is warm and dry.   Neurological :       General: No focal deficit present.      Mental Status: She is alert and oriented to person, place, and time.      Cranial Nerves: No cranial nerve deficit.      Sensory: No sensory deficit.      Motor: No weakness.      Coordination:  Coordination normal.      Deep Tendon Reflexes: Reflexes are normal and symmetric.      Comments: Pt is awake, alert, oriented x 3.  CNs 2-12 intact and normal.  No facial droop.  Pt is  answering questions and following commands appropriately.  Normal finger to nose.  No pronator drift.  Negative Romberg.  Slow RAMs.  Pt ambulatory with even, steady gait, moving BUE and BLE with equal strength and intact distal neurovascular status.                   Diagnostic Study Results        Labs -   No results found for this or any previous visit (from the past 12 hour(s)).      Radiologic Studies -    Ct Head Wo Cont      Result Date: 07/21/2019   EXAM: CT head INDICATION: Headache. COMPARISON: None.  TECHNIQUE: Axial CT imaging of the head was performed without intravenous contrast. Standard multiplanar coronal and sagittal reformatted images were obtained and are included in interpretation. One  or more dose reduction techniques were used on this CT: automated exposure control, adjustment of the mAs and/or kVp according to patient size, and iterative reconstruction techniques.  The specific techniques used on this CT exam have been documented  in the patient's electronic medical record.  Digital Imaging and Communications in Medicine (DICOM) format image data are available to nonaffiliated external healthcare facilities or entities on a secure, media free, reciprocally searchable basis with  patient authorization for at least a 11-monthperiod after this study. _______________ FINDINGS: BRAIN AND POSTERIOR FOSSA: The sulci,  folia, ventricles and basal cisterns are within normal limits for the patient?s age. There is no intracranial hemorrhage,  mass effect, or midline shift. There are no areas of abnormal parenchymal attenuation. EXTRA-AXIAL SPACES AND MENINGES: There are no abnormal extra-axial fluid collections. CALVARIUM: Intact. SINUSES: Clear. OTHER: None. _______________       IMPRESSION: No acute intracranial abnormality.              Medical Decision Making     I am the first provider for this patient.      I reviewed the vital signs, available nursing notes, past medical history, past surgical history, family history and social history.      Vital Signs-Reviewed the patient's vital signs.      Pulse Oximetry Analysis -  99% on room  air (Interpretation)      Records Reviewed: Nursing Notes and Old Medical Records  (Time of Review: 11:03 AM)      ED Course: Progress Notes, Reevaluation, and Consults:      Provider Notes (Medical Decision Making):    differential diagnosis: DJD, cervicalgia, myalgia, sprain, much less likely ICB      Plan: Patient presents ambulatory in no significant distress with elevated blood pressure and otherwise normal vitals.  Patient has history of left ICA aneurysm and right MCA bifurcation aneurysm.  CT head today demonstrates no bleeding.  Symptoms x 3  weeks.  Advised to follow-up with PCP for management of this chronic issue. Discussed case with Dr. JDarci Needle       At this time, patient is stable and appropriate for discharge home.  Patient demonstrates understanding of current diagnoses and is in agreement with the treatment plan.  They are advised that while the likelihood of serious underlying condition is low  at this point given the evaluation performed today, we cannot fully rule it out.  They are advised to immediately return with any new symptoms or worsening of current condition.  [name] questions have been answered.  Patient is given educational material  regarding their diagnoses, including  danger symptoms and when to return to the ED.         Diagnosis        Clinical Impression:       1.  Acute nonintractable headache, unspecified headache type         2.  Neck pain            Disposition: DC Home        Follow-up Information               Follow up With  Specialties  Details  Why  Contact Info              CToniann Ket NP  Nurse Practitioner  Call today  For follow-up and re-evaluation  [address]   [place] [address]   [phone]                 [facility] EMERGENCY DEPT  Emergency Medicine  Go to  As needed, If symptoms worsen  [place]   [phone]                   Patient's Medications       Start Taking          No medications on file       Continue Taking           ALBUTEROL (PROVENTIL HFA, VENTOLIN HFA, PROAIR HFA) 90 MCG/ACTUATION INHALER     Take 2 Puffs by inhalation every six (6) hours as needed for Wheezing.           ALPRAZOLAM (XANAX) 0.5 MG TABLET     Take 0.5 Tabs by mouth two (2) times daily as needed for Anxiety. Max Daily Amount: 0.5 mg.           ATORVASTATIN (LIPITOR) 10 MG TABLET     Take 1 Tab by mouth daily.           CHOLECALCIFEROL (VITAMIN D3) (2,000 UNITS /50 MCG) CAP CAPSULE     Take 2,000 Units by mouth daily.           DICLOFENAC EC (VOLTAREN) 75 MG EC TABLET     Take 1 Tab by mouth two (2) times a day.           FLASH GLUCOSE SCANNING READER (FREESTYLE LIBRE 14 DAY READER) MISC     Used as directed to check blood sugar 4 times a day           FLASH GLUCOSE SENSOR (FREESTYLE LIBRE 14 DAY SENSOR) KIT     Use as directed to check blood sugar 4 times a day           GABAPENTIN (NEURONTIN) 400 MG CAPSULE     800 mg PO TID           GLUCOSE BLOOD VI TEST STRIPS (FREESTYLE LITE STRIPS) STRIP     Use as directed to check blood sugar 4 times a day           IBUPROFEN (MOTRIN) 600 MG TABLET     Take 1 Tab by mouth every six (6) hours as needed for Pain.           INSULIN NPH (NOVOLIN N, HUMULIN N) 100 UNIT/ML INJECTION     50  Units in the AM, 50 Units in the PM           KETOCONAZOLE (NIZORAL) 2 % TOPICAL CREAM     Apply  to affected area two (2) times a day.           LETROZOLE (FEMARA) 2.5 MG TABLET     Take 2.5 mg by mouth daily.           NYSTATIN (MYCOSTATIN) POWDER     Apply to rash 4 times a day           OMEGA-3 ACID ETHYL ESTERS (LOVAZA) 1 GRAM CAPSULE     Take 2 Caps by mouth two (2) times a day for 360 days.           ONDANSETRON (ZOFRAN ODT) 4 MG DISINTEGRATING TABLET  Take 1 Tab by mouth every eight (8) hours as needed for Nausea or Vomiting.           RAMIPRIL (ALTACE) 10 MG CAPSULE     Take 1 capsule by mouth once daily           TIZANIDINE (ZANAFLEX) 2 MG TABLET     Take 1 Tab by mouth three (3) times daily as needed for Muscle Spasm(s).           VENLAFAXINE-SR (EFFEXOR-XR) 75 MG CAPSULE     TAKE 3 CAPSULES BY MOUTH ONCE DAILY       These Medications have changed          No medications on file       Stop Taking          No medications on file        _______________________________      This note was dictated utilizing voice recognition software which may lead to typographical errors.  I apologize in advance if the situation occurs.  If questions arise please do not hesitate to contact  me or call our department.  Barbara Saucer, PA-C

## 2019-07-21 NOTE — ED Notes (Signed)
 I have reviewed discharge instructions with the patient.  The patient verbalized understanding.  Pt in no distress at time of discharge and agreeable to terms of discharge.

## 2019-07-21 NOTE — ED Notes (Signed)
Patient ambulatory to triage c/o neck pain and a headache x 1 week. Denies known injury. Pain increases with movement.

## 2019-07-21 NOTE — ED Notes (Signed)
I have reviewed discharge instructions with the patient.  The patient verbalized understanding.  Pt in no distress at time of discharge and agreeable to terms of discharge.

## 2019-07-21 NOTE — ED Provider Notes (Signed)
EMERGENCY DEPARTMENT HISTORY AND PHYSICAL EXAM    11:03 AM      Date: 07/21/2019  Patient Name: Barbara [name]    History of Presenting Illness     Chief Complaint   Patient presents with   ??? Neck Pain   ??? Headache       History Provided By: Patient    Chief Complaint: headache, neck pain  Duration: 3 Weeks  Timing:  Acute  Location:   Quality: Aching  Severity: Moderate  Modifying Factors: none  Associated Symptoms: denies any other associated signs or symptoms      Additional History (Context):Barbara [name] is a 53 y.o. 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  Patient reports she was diagnosed 1 month ago with the aneurysms.  Patient states she had her MRA in the first place due to chronic headaches.  Patient denies any recent change in vision, nausea, vomiting, URI symptoms, cough, chest pain, shortness of breath, fever, chills, weakness, numbness, tingling, back pain, perianal numbness or incontinence of bowels or bladder, or any other complaints.  No treatments prior to arrival.  Patient reports symptoms are better when she hangs her head forward.      PCP:  Toniann Ket, NP        Current Outpatient Medications   Medication Sig Dispense Refill   ??? venlafaxine-SR (EFFEXOR-XR) 75 mg capsule TAKE 3 CAPSULES BY MOUTH ONCE DAILY 90 Cap 0   ??? omega-3 acid ethyl esters (Lovaza) 1 gram capsule Take 2 Caps by mouth two (2) times a day for 360 days. 120 Cap 11   ??? ibuprofen (MOTRIN) 600 mg tablet Take 1 Tab by mouth every six (6) hours as needed for Pain. 20 Tab 0   ??? ondansetron (Zofran ODT) 4 mg disintegrating tablet Take 1 Tab by mouth  every eight (8) hours as needed for Nausea or Vomiting. 8 Tab 0   ??? insulin NPH (NOVOLIN N, HUMULIN N) 100 unit/mL injection 50 Units in the AM, 50 Units in the PM 1 Vial 5   ??? gabapentin (NEURONTIN) 400 mg capsule 800 mg PO TID 180 Cap 3   ??? ALPRAZolam (Xanax) 0.5 mg tablet Take 0.5 Tabs by mouth two (2) times daily as needed for Anxiety. Max Daily Amount: 0.5 mg. 30 Tab 2   ??? tiZANidine (ZANAFLEX) 2 mg tablet Take 1 Tab by mouth three (3) times daily as needed for Muscle Spasm(s). 30 Tab 2   ??? glucose blood VI test strips (FreeStyle Lite Strips) strip Use as directed to check blood sugar 4 times a day 100 Strip 1   ??? atorvastatin (LIPITOR) 10 mg tablet Take 1 Tab by mouth daily. 90 Tab 1   ??? ramipriL (ALTACE) 10 mg capsule Take 1 capsule by mouth once daily 90 Cap 1   ??? cholecalciferol (VITAMIN D3) (2,000 UNITS /50 MCG) cap capsule Take 2,000 Units by mouth daily. 90 Cap 3   ??? flash glucose scanning reader (FreeStyle Libre 14 Day Reader) misc Used as directed to check blood sugar 4 times a day 1 Each 0   ??? flash glucose sensor (FreeStyle Libre 14 Day Sensor) kit Use as directed to check blood sugar 4 times a day  1 Kit 5   ??? diclofenac EC (VOLTAREN) 75 mg EC tablet Take 1 Tab by mouth two (2) times a day. 30 Tab 3   ??? albuterol (PROVENTIL HFA, VENTOLIN HFA, PROAIR HFA) 90 mcg/actuation inhaler Take 2 Puffs by inhalation every six (6) hours as needed for Wheezing. 1 Inhaler 3   ??? nystatin (MYCOSTATIN) powder Apply to rash 4 times a day 60 g 3   ??? ketoconazole (NIZORAL) 2 % topical cream Apply  to affected area two (2) times a day. 60 g 3   ??? letrozole (FEMARA) 2.5 mg tablet Take 2.5 mg by mouth daily.         Past History     Past Medical History:  Past Medical History:   Diagnosis Date   ??? Aneurysm ([place]) 2020    right MCA 3-4 mm and left ICA carotid cave 3 mm   ??? Atherosclerosis 2020   ??? Back pain    ??? Breast cancer ([name]) 2008 or 2009   ??? Carotid artery stenosis     2020 Dr. Constance Goltz, plans to repeat US in 2 years     ??? Chronic pain    ??? DDD (degenerative disc disease), cervical     multilevel   ??? Diabetes ([place])    ??? Facet joint disease of cervical region    ??? FH: chemotherapy    ??? Hypertension    ??? Hypertensive retinopathy 2020   ??? Nausea & vomiting    ??? Neuropathy    ??? Peripheral vascular disease ([place]) 2020    LLE Dr. Constance Goltz   ??? Radiation therapy complication 1761    NOT complication, just radiation   ??? Vertebral artery stenosis     2020, per Dr. Constance Goltz - mild       Past Surgical History:  Past Surgical History:   Procedure Laterality Date   ??? BIOPSY BREAST     ??? BIOPSY FINE NEEDLE     ??? HX BREAST AUGMENTATION Right 2010   ??? HX BREAST AUGMENTATION Left 09/17/2017    left breast reconstruction with exchange of tissue expander per breast prostesis and left breast tissue expander performed by [name], MD at [place]   ??? HX BREAST AUGMENTATION Right 04/01/2018    removal of tissue expanders with placment of right permanent breast prostesis performed by [name], MD at [place]   ??? HX BREAST RECONSTRUCTION Right 2011    per pt - attempted latissimus dorsi flap, then saline implant   ??? HX BREAST RECONSTRUCTION Bilateral 06/10/2017    right breast prosthesis and capsule removal and bilateral breast tissue  expander reconstruction.  performed by [name], MD at [facility] [place]   ??? HX BREAST RECONSTRUCTION Left 07/09/2017    left breast  EXCISION and closure of wound performed by [name], MD at [facility]   ??? HX BREAST RECONSTRUCTION Bilateral     Tissue expanders, to prepare for implants    ??? HX CESAREAN SECTION     ??? HX LUMBAR DISKECTOMY      herniated disc   ??? HX MASTECTOMY Right 2010    Total mastectomy per pt.     ??? HX MASTECTOMY Left 06/10/2017    left mastectomy,Sentinal node biopsies. performed by [name], MD at [facility]   ??? HX MASTECTOMY Bilateral    ??? HX MASTOPEXY (BREAST LIFT) Left 2011   ??? IMPLANT BREAST SILICONE/EQ Right 6073    post  mastectomy   ??? THUMB SUPPORT       thumb surgery to mobilize thumb       Family History:  Family History   Problem Relation Age of Onset   ??? Diabetes Mother    ??? No Known Problems Father        Social History:  Social History     Tobacco Use   ??? Smoking status: Current Some Day Smoker     Packs/day: 0.50     Years: 15.00     Pack years: 7.50     Types: Cigarettes   ??? Smokeless tobacco: Never Used   ??? Tobacco comment: 5-6 cigs per day patient education given   Substance Use Topics   ??? Alcohol use: No   ??? Drug use: No       Allergies:  Allergies   Allergen Reactions   ??? Tramadol Anaphylaxis   ??? Metformin Diarrhea   ??? Trulicity [Dulaglutide] Nausea Only         Review of Systems       Review of Systems   Constitutional: Negative for chills and fever.   HENT: Negative for congestion, rhinorrhea and sore throat.    Eyes: Negative for photophobia and visual disturbance.   Respiratory: Negative for cough and shortness of breath.    Cardiovascular: Negative for chest pain.   Gastrointestinal: Negative for abdominal pain, blood in stool, constipation, diarrhea, nausea and vomiting.   Genitourinary: Negative for dysuria, frequency and hematuria.   Musculoskeletal: Positive for neck pain. Negative for back pain and myalgias.   Skin: Negative for rash and wound.   Neurological: Positive for headaches. Negative for dizziness.   [name] other systems reviewed and are negative.        Physical Exam     Visit Vitals  BP (!) 156/96 (BP 1 Location: Left arm, BP Patient Position: At rest)   Pulse 79   Temp 97.9 ??F (36.6 ??C)   Resp 18   Ht 5' 2"  (1.575 m)   Wt 61.2 kg (135 lb)   SpO2 99%   BMI 24.69 kg/m??       Physical Exam  Vitals signs and nursing note reviewed.   Constitutional:       General: She is not in acute distress.     Appearance: She is well-developed. She is not diaphoretic.      Comments: Well-appearing, nontoxic   HENT:      Head: Normocephalic and atraumatic.   Eyes:      Conjunctiva/sclera: Conjunctivae normal.   Neck:       Musculoskeletal: Normal range of motion and neck supple.   Cardiovascular:      Rate and Rhythm: Normal rate and regular rhythm.      Heart sounds: Normal heart sounds.   Pulmonary:      Effort: Pulmonary effort is normal. No respiratory distress.      Breath sounds: Normal breath sounds. No stridor. No wheezing, rhonchi or rales.   Chest:      Chest wall: No tenderness.   Abdominal:      Tenderness: There is no rebound.   Musculoskeletal:         General: Tenderness present. No deformity.      Comments: Tender to palpation along bilateral cervical paraspinal muscles.  Decreased range of motion of head secondary to pain.   Skin:     General: Skin is warm and dry.   Neurological:      General: No  focal deficit present.      Mental Status: She is alert and oriented to person, place, and time.      Cranial Nerves: No cranial nerve deficit.      Sensory: No sensory deficit.      Motor: No weakness.      Coordination: Coordination normal.      Deep Tendon Reflexes: Reflexes are normal and symmetric.      Comments: Pt is awake, alert, oriented x 3.  CNs 2-12 intact and normal.  No facial droop.  Pt is answering questions and following commands appropriately.  Normal finger to nose.  No pronator drift.  Negative Romberg.  Slow RAMs.  Pt ambulatory with even, steady gait, moving BUE and BLE with equal strength and intact distal neurovascular status.             Diagnostic Study Results     Labs -  No results found for this or any previous visit (from the past 12 hour(s)).    Radiologic Studies -   Ct Head Wo Cont    Result Date: 07/21/2019  EXAM: CT head INDICATION: Headache. COMPARISON: None. TECHNIQUE: Axial CT imaging of the head was performed without intravenous contrast. Standard multiplanar coronal and sagittal reformatted images were obtained and are included in interpretation. One or more dose reduction techniques were used on this CT: automated exposure control, adjustment of the mAs and/or  kVp according to patient size, and iterative reconstruction techniques.  The specific techniques used on this CT exam have been documented in the patient's electronic medical record.  Digital Imaging and Communications in Medicine (DICOM) format image data are available to nonaffiliated external healthcare facilities or entities on a secure, media free, reciprocally searchable basis with patient authorization for at least a 52-monthperiod after this study. _______________ FINDINGS: BRAIN AND POSTERIOR FOSSA: The sulci, folia, ventricles and basal cisterns are within normal limits for the patient?s age. There is no intracranial hemorrhage, mass effect, or midline shift. There are no areas of abnormal parenchymal attenuation. EXTRA-AXIAL SPACES AND MENINGES: There are no abnormal extra-axial fluid collections. CALVARIUM: Intact. SINUSES: Clear. OTHER: None. _______________     IMPRESSION: No acute intracranial abnormality.        Medical Decision Making   I am the first provider for this patient.    I reviewed the vital signs, available nursing notes, past medical history, past surgical history, family history and social history.    Vital Signs-Reviewed the patient's vital signs.    Pulse Oximetry Analysis -  99% on room air (Interpretation)    Records Reviewed: Nursing Notes and Old Medical Records (Time of Review: 11:03 AM)    ED Course: Progress Notes, Reevaluation, and Consults:    Provider Notes (Medical Decision Making):   differential diagnosis: DJD, cervicalgia, myalgia, sprain, much less likely ICB    Plan: Patient presents ambulatory in no significant distress with elevated blood pressure and otherwise normal vitals.  Patient has history of left ICA aneurysm and right MCA bifurcation aneurysm.  CT head today demonstrates no bleeding.  Symptoms x 3 weeks.  Advised to follow-up with PCP for management of this chronic issue. Discussed case with Dr. JDarci Needle      At this time, patient is stable and appropriate for discharge home.  Patient demonstrates understanding of current diagnoses and is in agreement with the treatment plan.  They are advised that while the likelihood of serious underlying condition is low at this point given the evaluation performed today,  we cannot fully rule it out.  They are advised to immediately return with any new symptoms or worsening of current condition.  [name] questions have been answered.  Patient is given educational material regarding their diagnoses, including danger symptoms and when to return to the ED.     Diagnosis     Clinical Impression:   1. Acute nonintractable headache, unspecified headache type    2. Neck pain        Disposition: DC Home    Follow-up Information     Follow up With Specialties Details Why Contact Info    Toniann Ket, NP Nurse Practitioner Call today For follow-up and re-evaluation [address]  [place] [address]  [phone]      [facility] EMERGENCY DEPT Emergency Medicine Go to As needed, If symptoms worsen [address]  [phone]           Patient's Medications   Start Taking    No medications on file   Continue Taking    ALBUTEROL (PROVENTIL HFA, VENTOLIN HFA, PROAIR HFA) 90 MCG/ACTUATION INHALER    Take 2 Puffs by inhalation every six (6) hours as needed for Wheezing.    ALPRAZOLAM (XANAX) 0.5 MG TABLET    Take 0.5 Tabs by mouth two (2) times daily as needed for Anxiety. Max Daily Amount: 0.5 mg.    ATORVASTATIN (LIPITOR) 10 MG TABLET    Take 1 Tab by mouth daily.    CHOLECALCIFEROL (VITAMIN D3) (2,000 UNITS /50 MCG) CAP CAPSULE    Take 2,000 Units by mouth daily.    DICLOFENAC EC (VOLTAREN) 75 MG EC TABLET    Take 1 Tab by mouth two (2) times a day.    FLASH GLUCOSE SCANNING READER (FREESTYLE LIBRE 14 DAY READER) MISC    Used as directed to check blood sugar 4 times a day    FLASH GLUCOSE SENSOR (FREESTYLE LIBRE 14 DAY SENSOR) KIT    Use as  directed to check blood sugar 4 times a day    GABAPENTIN (NEURONTIN) 400 MG CAPSULE    800 mg PO TID    GLUCOSE BLOOD VI TEST STRIPS (FREESTYLE LITE STRIPS) STRIP    Use as directed to check blood sugar 4 times a day    IBUPROFEN (MOTRIN) 600 MG TABLET    Take 1 Tab by mouth every six (6) hours as needed for Pain.    INSULIN NPH (NOVOLIN N, HUMULIN N) 100 UNIT/ML INJECTION    50 Units in the AM, 50 Units in the PM    KETOCONAZOLE (NIZORAL) 2 % TOPICAL CREAM    Apply  to affected area two (2) times a day.    LETROZOLE (FEMARA) 2.5 MG TABLET    Take 2.5 mg by mouth daily.    NYSTATIN (MYCOSTATIN) POWDER    Apply to rash 4 times a day    OMEGA-3 ACID ETHYL ESTERS (LOVAZA) 1 GRAM CAPSULE    Take 2 Caps by mouth two (2) times a day for 360 days.    ONDANSETRON (ZOFRAN ODT) 4 MG DISINTEGRATING TABLET    Take 1 Tab by mouth every eight (8) hours as needed for Nausea or Vomiting.    RAMIPRIL (ALTACE) 10 MG CAPSULE    Take 1 capsule by mouth once daily    TIZANIDINE (ZANAFLEX) 2 MG TABLET    Take 1 Tab by mouth three (3) times daily as needed for Muscle Spasm(s).    VENLAFAXINE-SR (  EFFEXOR-XR) 75 MG CAPSULE    TAKE 3 CAPSULES BY MOUTH ONCE DAILY   These Medications have changed    No medications on file   Stop Taking    No medications on file     _______________________________    This note was dictated utilizing voice recognition software which may lead to typographical errors.  I apologize in advance if the situation occurs.  If questions arise please do not hesitate to contact me or call our department.  August Saucer, PA-C

## 2019-07-21 NOTE — ED Triage Notes (Signed)
Patient ambulatory to triage c/o neck pain and a headache x 1 week. Denies known injury. Pain increases with movement.

## 2019-07-22 ENCOUNTER — Ambulatory Visit: Attending: Family Medicine | Primary: Nurse Practitioner

## 2019-07-22 ENCOUNTER — Ambulatory Visit
Admit: 2019-07-22 | Discharge: 2019-07-22 | Payer: PRIVATE HEALTH INSURANCE | Attending: Family Medicine | Primary: Nurse Practitioner

## 2019-07-22 DIAGNOSIS — Z0000 Encounter for general adult medical examination without abnormal findings: Secondary | ICD-10-CM

## 2019-07-22 MED ORDER — BUPROPION SR 150 MG TAB
150 mg | ORAL_TABLET | Freq: Two times a day (BID) | ORAL | 1 refills | Status: AC
Start: 2019-07-22 — End: ?

## 2019-07-22 NOTE — Progress Notes (Signed)
Barbara [name] is a 53 y.o. Caucasian female and presents with    Chief Complaint   Patient presents with   ??? Pain (Chronic)   ??? Depression   ??? Anxiety   ??? Diabetes       Subjective:  Ear Pain  Patient complains of ear pain and possible ear infection. Symptoms include right ear pain, plugged sensation in right ear and rhinorrhea. Onset of symptoms was 3 days ago, unchanged since that time. She also c/o 3 days congestion.  She is drinking moderate amounts of fluids.    Diabetes Mellitus:  She has diabetes mellitus, and  hypertension and hyperlipidemia.  Diabetic ROS - medication compliance: compliant all of the time, diabetic diet compliance: noncompliant some of the time.   Lab review: labs reviewed, I note that glycosylated hemoglobin abnormal .   Depression Review:  Patient is seen for followup of depression. Treatment includes Effexor and individual therapy.   Ongoing symptoms include depressed mood, anhedonia, insomnia and fatigue but improved.  She denies feelings of worthlessness/guilt, hopelessness and recurrent thoughts of death.   She experiences the following side effects from the treatment: none.  Anxiety Review:  Patient is seen for anxiety disorder. Current treatment includes Effexor, Xanax and individual therapy.   Ongoing symptoms include: palpitations, shortness of breath, paresthesias, racing thoughts, psychomotor agitation, feelings of losing control, difficulty concentrating.   Patient denies: suicidal ideation.   Reported side effects from the treatment: none.  Osteoarthritis and Chronic Pain:  Patient has osteoarthritis, primarily affecting the back and hips.   Symptoms onset: problem is longstanding.  Rheumatological ROS: ongoing significant pain in back and hips which is stable and controlled by PRN meds.   Response to treatment plan: symptoms have progressed to a point and plateaued..     ROS   Constitutional: Positive for appetite change. Negative for fever.   HENT: Negative for sore  throat.    Eyes: Negative for visual disturbance.   Respiratory: Negative for shortness of breath.    Cardiovascular: Negative for chest pain.   Gastrointestinal: Positive for abdominal pain. Negative for blood in stool.   Genitourinary: Negative for difficulty urinating and dysuria.   Musculoskeletal: Negative for gait problem.   Skin: Negative for rash.   Allergic/Immunologic: Negative for immunocompromised state.   Neurological: Negative for syncope.   Psychiatric/Behavioral: Positive for sleep disturbance.   ??    All other systems reviewed and are negative.      Objective:  Vitals:    07/22/19 1405   BP: (!) 148/90   Pulse: 81   Resp: 16   Temp: (!) 96.4 ??F (35.8 ??C)   TempSrc: Oral   SpO2: 98%   Weight: 132 lb (59.9 kg)   Height: 5\' 2"  (1.575 m)   PainSc:   4   PainLoc: Neck     BMI 24.14 kg/m??       General appearance  alert, cooperative, no distress, appears stated age   Head  Normocephalic, without obvious abnormality, atraumatic   Eyes  conjunctivae/corneas clear. PERRL, EOM's intact.   Ears  Right TM with fluid; no erythema; left normal TM and external ear canals AU   Nose Nares normal. Septum midline. Mucosa normal. No drainage or sinus tenderness.   Throat Lips, mucosa, and tongue normal. Teeth and gums normal   Neck supple, symmetrical, trachea midline, no adenopathy, thyroid: not enlarged, symmetric, no tenderness/mass/nodules   Back   symmetric, no curvature. ROM normal. No CVA tenderness  Lungs   clear to auscultation bilaterally   Breasts  Not examined   Heart  regular rate and rhythm, S1, S2 normal, no murmur, click, rub or gallop   Abdomen   soft, non-tender. Bowel sounds normal. No masses,  No organomegaly   Pelvic Deferred   Extremities extremities normal, atraumatic, no cyanosis or edema   Pulses 2+ and symmetric   Skin Skin color, texture, turgor normal. No rashes or lesions   Lymph nodes Cervical, supraclavicular, and axillary nodes normal.   Neurologic Normal     Diabetic foot exam:      Left Foot:   Visual Exam: normal    Pulse DP: 2+ (normal)   Filament test: reduced sensation       Right Foot:   Visual Exam: normal    Pulse DP: 2+ (normal)   Filament test: reduced sensation       LABS     TESTS      Assessment/Plan:    1. Annual physical exam  Reviewed preventive recommendations    2. Diabetes mellitus due to underlying condition with diabetic autonomic neuropathy, with long-term current use of insulin (HCC)    - HM DIABETES FOOT EXAM  - AMB SUPPLY ORDER    3. Depression with anxiety  Mood stable with current medications    4. Hot flashes due to menopause  Continue effexor    5. Lumbar radiculopathy, acute  Gabapentin; stretching exercises    6. Aneurysm ([place])  F/u with vascular surgery    7. DDD (degenerative disc disease), cervical  Range of motion exercises    8. Hypercholesterolemia  Encourage healthy diet    9. Cigarette nicotine dependence without complication  Counseled on smoking cessation for > 42minutes; start medication to assist with smoking cessation  - PR SMOKING AND TOBACCO USE CESSATION 3 - 10 MINUTES  - buPROPion SR (WELLBUTRIN SR) 150 mg SR tablet; Take 1 Tab by mouth two (2) times a day.  Dispense: 60 Tab; Refill: 1    10. Acute dysfunction of right eustachian tube  Pt reassured; start antihistamine    Lab review: labs reviewed, I note that glycosylated hemoglobin abnormal       I have discussed the diagnosis with the patient and the intended plan as seen in the above orders.  The patient has received an after-visit summary and questions were answered concerning future plans.  I have discussed medication side effects and warnings with the patient as well. I have reviewed the plan of care with the patient, accepted their input and they are in agreement with the treatment goals.

## 2019-07-22 NOTE — Progress Notes (Signed)
Barbara [name] presents today for   Chief Complaint   Patient presents with   . Pain (Chronic)   . Depression   . Anxiety   . Diabetes       Is someone accompanying this pt? no    Is the patient using any DME equipment during OV? no    Depression Screening:  3 most recent PHQ Screens 07/22/2019   Little interest or pleasure in doing things Several days   Feeling down, depressed, irritable, or hopeless Several days   Total Score PHQ 2 2   Trouble falling or staying asleep, or sleeping too much -   Feeling tired or having little energy -   Poor appetite, weight loss, or overeating -   Feeling bad about yourself - or that you are a failure or have let yourself or your family down -   Trouble concentrating on things such as school, work, reading, or watching TV -   Moving or speaking so slowly that other people could have noticed; or the opposite being so fidgety that others notice -   Thoughts of being better off dead, or hurting yourself in some way -   PHQ 9 Score -   How difficult have these problems made it for you to do your work, take care of your home and get along with others -       Learning Assessment:  Learning Assessment 02/23/2018   PRIMARY LEARNER Patient   HIGHEST LEVEL OF EDUCATION - PRIMARY LEARNER  2 YEARS OF COLLEGE   BARRIERS PRIMARY LEARNER NONE   CO-LEARNER CAREGIVER No   PRIMARY LANGUAGE ENGLISH   LEARNER PREFERENCE PRIMARY LISTENING   ANSWERED BY patient   RELATIONSHIP SELF       Abuse Screening:  Abuse Screening Questionnaire 10/23/2018   Do you ever feel afraid of your partner? N   Are you in a relationship with someone who physically or mentally threatens you? N   Is it safe for you to go home? Y       Fall Risk  Fall Risk Assessment, last 12 mths 02/23/2018   Able to walk? Yes   Fall in past 12 months? No       Health Maintenance reviewed and discussed and ordered per Provider.    Health Maintenance Due   Topic Date Due   . Colonoscopy  11/27/1985   . PAP AKA CERVICAL CYTOLOGY  11/27/1988    . Shingrix Vaccine Age 99> (1 of 2) 11/27/2017   . Flu Vaccine (1) 06/15/2019   . A1C test (Diabetic or Prediabetic)  07/28/2019   . Foot Exam Q1  08/13/2019   .      Coordination of Care:  1. Have you been to the ER, urgent care clinic since your last visit? Hospitalized since your last visit? Yes, 07/21/19, [facility], neck pain    2. Have you seen or consulted any other health care providers outside of the [facility] since your last visit? Include any pap smears or colon screening. no      Last PMP Checked na  Last UDS Checked na  Last Pain contract signed: na    Patient presents in office today for routine care.  Patient concerns: diabetic shoe order.

## 2019-07-22 NOTE — Progress Notes (Signed)
Barbara [name] is a 53 y.o. Caucasian female and presents with    Chief Complaint   Patient presents with   ??? Pain (Chronic)   ??? Depression   ??? Anxiety   ??? Diabetes       Subjective:  Ear Pain  Patient complains of ear pain and possible ear infection. Symptoms include right ear pain, plugged sensation in right ear and rhinorrhea. Onset of symptoms was 3 days ago, unchanged since that time. She also c/o 3 days congestion.  She is drinking moderate amounts of fluids.    Diabetes Mellitus:  She has diabetes mellitus, and  hypertension and hyperlipidemia.  Diabetic ROS - medication compliance: compliant all of the time, diabetic diet compliance: noncompliant some of the time.   Lab review: labs reviewed, I note that glycosylated hemoglobin abnormal .   Depression Review:  Patient is seen for followup of depression. Treatment includes Effexor and individual therapy.   Ongoing symptoms include depressed mood, anhedonia, insomnia and fatigue but improved.  She denies feelings of worthlessness/guilt, hopelessness and recurrent thoughts of death.   She experiences the following side effects from the treatment: none.  Anxiety Review:  Patient is seen for anxiety disorder. Current treatment includes Effexor, Xanax and individual therapy.   Ongoing symptoms include: palpitations, shortness of breath, paresthesias, racing thoughts, psychomotor agitation, feelings of losing control, difficulty concentrating.   Patient denies: suicidal ideation.   Reported side effects from the treatment: none.  Osteoarthritis and Chronic Pain:  Patient has osteoarthritis, primarily affecting the back and hips.   Symptoms onset: problem is longstanding.  Rheumatological ROS: ongoing significant pain in back and hips which is stable and controlled by PRN meds.   Response to treatment plan: symptoms have progressed to a point and plateaued..     ROS   Constitutional: Positive for appetite change. Negative for fever.    HENT: Negative for sore throat.    Eyes: Negative for visual disturbance.   Respiratory: Negative for shortness of breath.    Cardiovascular: Negative for chest pain.   Gastrointestinal: Positive for abdominal pain. Negative for blood in stool.   Genitourinary: Negative for difficulty urinating and dysuria.   Musculoskeletal: Negative for gait problem.   Skin: Negative for rash.   Allergic/Immunologic: Negative for immunocompromised state.   Neurological: Negative for syncope.   Psychiatric/Behavioral: Positive for sleep disturbance.   ??    All other systems reviewed and are negative.      Objective:  Vitals:    07/22/19 1405   BP: (!) 148/90   Pulse: 81   Resp: 16   Temp: (!) 96.4 ??F (35.8 ??C)   TempSrc: Oral   SpO2: 98%   Weight: 132 lb (59.9 kg)   Height: 5\' 2"  (1.575 m)   PainSc:   4   PainLoc: Neck     BMI 24.14 kg/m??       General appearance  alert, cooperative, no distress, appears stated age   Head  Normocephalic, without obvious abnormality, atraumatic   Eyes  conjunctivae/corneas clear. PERRL, EOM's intact.   Ears  Right TM with fluid; no erythema; left normal TM and external ear canals AU   Nose Nares normal. Septum midline. Mucosa normal. No drainage or sinus tenderness.   Throat Lips, mucosa, and tongue normal. Teeth and gums normal   Neck supple, symmetrical, trachea midline, no adenopathy, thyroid: not enlarged, symmetric, no tenderness/mass/nodules   Back   symmetric, no curvature. ROM normal. No CVA tenderness  Lungs   clear to auscultation bilaterally   Breasts  Not examined   Heart  regular rate and rhythm, S1, S2 normal, no murmur, click, rub or gallop   Abdomen   soft, non-tender. Bowel sounds normal. No masses,  No organomegaly   Pelvic Deferred   Extremities extremities normal, atraumatic, no cyanosis or edema   Pulses 2+ and symmetric   Skin Skin color, texture, turgor normal. No rashes or lesions   Lymph nodes Cervical, supraclavicular, and axillary nodes normal.   Neurologic Normal      Diabetic foot exam:     Left Foot:   Visual Exam: normal    Pulse DP: 2+ (normal)   Filament test: reduced sensation       Right Foot:   Visual Exam: normal    Pulse DP: 2+ (normal)   Filament test: reduced sensation       LABS     TESTS      Assessment/Plan:    1. Annual physical exam  Reviewed preventive recommendations    2. Diabetes mellitus due to underlying condition with diabetic autonomic neuropathy, with long-term current use of insulin (HCC)    - HM DIABETES FOOT EXAM  - AMB SUPPLY ORDER    3. Depression with anxiety  Mood stable with current medications    4. Hot flashes due to menopause  Continue effexor    5. Lumbar radiculopathy, acute  Gabapentin; stretching exercises    6. Aneurysm ([place])  F/u with vascular surgery    7. DDD (degenerative disc disease), cervical  Range of motion exercises    8. Hypercholesterolemia  Encourage healthy diet    9. Cigarette nicotine dependence without complication  Counseled on smoking cessation for > 43minutes; start medication to assist with smoking cessation  - PR SMOKING AND TOBACCO USE CESSATION 3 - 10 MINUTES  - buPROPion SR (WELLBUTRIN SR) 150 mg SR tablet; Take 1 Tab by mouth two (2) times a day.  Dispense: 60 Tab; Refill: 1    10. Acute dysfunction of right eustachian tube  Pt reassured; start antihistamine    Lab review: labs reviewed, I note that glycosylated hemoglobin abnormal       I have discussed the diagnosis with the patient and the intended plan as seen in the above orders.  The patient has received an after-visit summary and questions were answered concerning future plans.  I have discussed medication side effects and warnings with the patient as well. I have reviewed the plan of care with the patient, accepted their input and they are in agreement with the treatment goals.

## 2019-07-22 NOTE — Progress Notes (Signed)
Kristinia Lyn Rencher presents today for   Chief Complaint   Patient presents with   ??? Pain (Chronic)   ??? Depression   ??? Anxiety   ??? Diabetes       Is someone accompanying this pt? no    Is the patient using any DME equipment during [place]? no    Depression Screening:  3 most recent PHQ Screens 07/22/2019   Little interest or pleasure in doing things Several days   Feeling down, depressed, irritable, or hopeless Several days   Total Score PHQ 2 2   Trouble falling or staying asleep, or sleeping too much -   Feeling tired or having little energy -   Poor appetite, weight loss, or overeating -   Feeling bad about yourself - or that you are a failure or have let yourself or your family down -   Trouble concentrating on things such as school, work, reading, or watching TV -   Moving or speaking so slowly that other people could have noticed; or the opposite being so fidgety that others notice -   Thoughts of being better off dead, or hurting yourself in some way -   PHQ 9 Score -   How difficult have these problems made it for you to do your work, take care of your home and get along with others -       Learning Assessment:  Learning Assessment 02/23/2018   PRIMARY LEARNER Patient   HIGHEST LEVEL OF EDUCATION - PRIMARY LEARNER  2 YEARS OF COLLEGE   BARRIERS PRIMARY LEARNER NONE   CO-LEARNER CAREGIVER No   PRIMARY LANGUAGE ENGLISH   LEARNER PREFERENCE PRIMARY LISTENING   ANSWERED BY patient   RELATIONSHIP SELF       Abuse Screening:  Abuse Screening Questionnaire 10/23/2018   Do you ever feel afraid of your partner? N   Are you in a relationship with someone who physically or mentally threatens you? N   Is it safe for you to go home? Y       Fall Risk  Fall Risk Assessment, last 12 mths 02/23/2018   Able to walk? Yes   Fall in past 12 months? No       Health Maintenance reviewed and discussed and ordered per Provider.    Health Maintenance Due   Topic Date Due   ??? Colonoscopy  11/27/1985   ??? PAP AKA CERVICAL CYTOLOGY  11/27/1988    ??? Shingrix Vaccine Age 35> (1 of 2) 11/27/2017   ??? Flu Vaccine (1) 06/15/2019   ??? A1C test (Diabetic or Prediabetic)  07/28/2019   ??? Foot Exam Q1  08/13/2019   .      Coordination of Care:  1. Have you been to the ER, urgent care clinic since your last visit? Hospitalized since your last visit? Yes, 07/21/19, [facility], neck pain    2. Have you seen or consulted any other health care providers outside of the [facility] since your last visit? Include any pap smears or colon screening. no      Last PMP Checked na  Last UDS Checked na  Last Pain contract signed: na    Patient presents in office today for routine care.  Patient concerns: diabetic shoe order.

## 2019-07-23 LAB — HEMOGLOBIN A1C W/O EAG
AVERAGE GLUCOSE: 217 mg/dL — ABNORMAL HIGH (ref 91–123)
AVG GLU: 217 mg/dL — ABNORMAL HIGH (ref 91–123)
Hemoglobin A1C POC: 9.2 %
Hemoglobin A1C: 9.2 % — ABNORMAL HIGH (ref 4.8–5.6)
Hemoglobin A1c (POC): 9.2 %
Hemoglobin A1c: 9.2 % — ABNORMAL HIGH (ref 4.8–5.6)

## 2019-08-01 ENCOUNTER — Encounter

## 2019-08-04 NOTE — Telephone Encounter (Signed)
Pt. Would like to discuss A1C result. Please advise.

## 2019-08-05 ENCOUNTER — Encounter: Attending: Surgery | Primary: Nurse Practitioner

## 2019-08-07 ENCOUNTER — Encounter

## 2019-08-09 MED ORDER — RAMIPRIL 10 MG CAP
10 mg | ORAL_CAPSULE | ORAL | 0 refills | Status: AC
Start: 2019-08-09 — End: ?

## 2019-08-09 MED ORDER — VENLAFAXINE SR 75 MG 24 HR CAP
75 mg | ORAL_CAPSULE | ORAL | 0 refills | Status: DC
Start: 2019-08-09 — End: 2019-08-17

## 2019-08-10 NOTE — Telephone Encounter (Signed)
Please let her know, her A1C was 9.3, which is better. Great job! It is still above goal so please follow up to discuss medication adjustments.

## 2019-08-10 NOTE — Telephone Encounter (Signed)
Pt. Advised of results per Lelan Pons and tolerated well. Pt. Scheduled for vv on 08/17/2019 at 11:15.

## 2019-08-12 ENCOUNTER — Encounter: Payer: PRIVATE HEALTH INSURANCE | Attending: Surgery | Primary: Nurse Practitioner

## 2019-08-17 ENCOUNTER — Telehealth: Attending: Nurse Practitioner | Primary: Nurse Practitioner

## 2019-08-17 ENCOUNTER — Telehealth
Admit: 2019-08-17 | Discharge: 2019-08-17 | Payer: PRIVATE HEALTH INSURANCE | Attending: Nurse Practitioner | Primary: Nurse Practitioner

## 2019-08-17 DIAGNOSIS — E1140 Type 2 diabetes mellitus with diabetic neuropathy, unspecified: Secondary | ICD-10-CM

## 2019-08-17 DIAGNOSIS — N951 Menopausal and female climacteric states: Secondary | ICD-10-CM

## 2019-08-17 MED ORDER — INSULIN LISPRO PROTAMINE/INSULIN LISPRO (75/25) 100 UNIT/ML (3 ML) SUB-Q PEN
100 unit/mL (75-25) | Freq: Two times a day (BID) | SUBCUTANEOUS | 5 refills | Status: AC
Start: 2019-08-17 — End: ?

## 2019-08-17 MED ORDER — VENLAFAXINE SR 75 MG 24 HR CAP
75 mg | ORAL_CAPSULE | Freq: Every day | ORAL | 2 refills | Status: DC
Start: 2019-08-17 — End: 2019-09-15

## 2019-08-17 NOTE — Progress Notes (Signed)
Barbara [name] is a 53 y.o. female evaluated via audio only technology on 08/17/2019.      Consent: She and/or her health care decision maker is aware that she may receive a bill for this audio only encounter, depending on her insurance coverage, and has provided verbal consent to proceed: yes    I communicated with the patient and/or health care decision maker about the nature and details of the following:    Subjective:   Barbara [name] is a 53 y.o. female who was seen for follow up.     HPI    Diabetes, type 2 follow up:  A1C is 9.2 down from 10.3.  Taking medications as prescribed: Has not taken insulin in the last 2 days. The NPH insulin is not covered by her insurance.  Following diabetic diet: doing better  Checking blood sugar: has her glucometer, but has not checked the last few days.   Symptoms of hyperglycemia: none  Symptoms of hypoglycemia: none      Review of Systems   Constitutional: Negative for appetite change, diaphoresis, fatigue and unexpected weight change.   Eyes: Negative for visual disturbance.   Respiratory: Negative for cough, chest tightness and shortness of breath.    Cardiovascular: Negative for chest pain, palpitations and leg swelling.   Gastrointestinal: Negative for abdominal distention, abdominal pain, blood in stool, constipation, diarrhea, nausea, rectal pain and vomiting.   Endocrine: Negative for polydipsia, polyphagia and polyuria.   Genitourinary: Negative for decreased urine volume, dysuria and frequency.   Musculoskeletal: Negative for joint swelling and myalgias.   Skin: Negative for rash and wound.   Neurological: Negative for dizziness, weakness, light-headedness, numbness and headaches.   Psychiatric/Behavioral: Positive for dysphoric mood. Negative for sleep disturbance and suicidal ideas. The patient is nervous/anxious.        Patient Active Problem List   Diagnosis Code   ??? Essential hypertension I10   ??? DM type 2, uncontrolled, with neuropathy ([place]) E11.40, E11.65    ??? Pain of left hip joint M25.552   ??? Low back pain M54.5   ??? Smoker F17.200   ??? Dupuytren contracture M72.0   ??? S/P mastectomy, left Z90.12   ??? Cigarette nicotine dependence without complication U13.244   ??? Recurrent depression (HCC) F33.9   ??? History of breast cancer Z85.3   ??? Hypertensive retinopathy H35.039   ??? Aneurysm (HCC) I72.9   ??? 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 DDD (degenerative disc disease), cervical M50.30   ??? Peripheral vascular disease (HCC) I73.9   ??? Carotid artery stenosis I65.29   ??? Vertebral artery stenosis I65.09     Past Medical History:   Diagnosis Date   ??? Aneurysm ([place]) 2020    right MCA 3-4 mm and left ICA carotid cave 3 mm   ??? Atherosclerosis 2020   ??? Back pain    ??? Breast cancer ([place]) 2008 or 2009   ??? Carotid artery stenosis     2020 Dr. Constance Goltz, plans to repeat US in 2 years    ??? Chronic pain    ??? DDD (degenerative disc disease), cervical     multilevel   ??? Diabetes ([place])    ??? Facet joint disease of cervical region    ??? FH: chemotherapy    ??? Hypertension    ??? Hypertensive retinopathy 2020   ??? Nausea & vomiting    ??? Neuropathy    ??? Peripheral  vascular disease ([place]) 2020    LLE Dr. Constance Goltz   ??? Radiation therapy complication 9811    NOT complication, just radiation   ??? Vertebral artery stenosis     2020, per Dr. Constance Goltz - mild     Patient Care Team:  Toniann Ket, NP as PCP - General (Nurse Practitioner)  Isaiah Blakes Kerrin Champagne, NP as PCP - [facility] Empaneled Provider  Anastasio Auerbach., RN as Nurse Navigator (Oncology)  Reita Cliche, MD (Breast Surgery)  Lilian Coma, MD as Physician (Physical Medicine and Rehabilitation)  Constance Goltz Brynda Peon, MD (Vascular Surgery)    Past Surgical History:   Procedure Laterality Date   ??? BIOPSY BREAST     ??? BIOPSY FINE NEEDLE     ??? HX BREAST AUGMENTATION Right 2010   ??? HX BREAST AUGMENTATION Left 09/17/2017    left breast reconstruction with exchange of tissue expander per breast  prostesis and left breast tissue expander performed by [name], MD at [place]   ??? HX BREAST AUGMENTATION Right 04/01/2018    removal of tissue expanders with placment of right permanent breast prostesis performed by [name], MD at [place]   ??? HX BREAST RECONSTRUCTION Right 2011    per pt - attempted latissimus dorsi flap, then saline implant   ??? HX BREAST RECONSTRUCTION Bilateral 06/10/2017    right breast prosthesis and capsule removal and bilateral breast tissue  expander reconstruction.  performed by [name], MD at [place]   ??? HX BREAST RECONSTRUCTION Left 07/09/2017    left breast  EXCISION and closure of wound performed by [name], MD at [place]   ??? HX BREAST RECONSTRUCTION Bilateral     Tissue expanders, to prepare for implants    ??? HX CESAREAN SECTION     ??? HX LUMBAR DISKECTOMY      herniated disc   ??? HX MASTECTOMY Right 2010    Total mastectomy per pt.     ??? HX MASTECTOMY Left 06/10/2017    left mastectomy,Sentinal node biopsies. performed by [name], MD at [facility] MAIN OR   ??? HX MASTECTOMY Bilateral    ??? HX MASTOPEXY (BREAST LIFT) Left 2011   ??? IMPLANT BREAST SILICONE/EQ Right 9147    post mastectomy   ??? THUMB SUPPORT      thumb surgery to mobilize thumb     Family History   Problem Relation Age of Onset   ??? Diabetes Mother    ??? No Known Problems Father      Social History     Tobacco Use   ??? Smoking status: Current Some Day Smoker     Packs/day: 0.50     Years: 15.00     Pack years: 7.50     Types: Cigarettes   ??? Smokeless tobacco: Never Used   ??? Tobacco comment: 5-6 cigs per day patient education given   Substance Use Topics   ??? Alcohol use: No   ??? Drug use: No     Allergies   Allergen Reactions   ??? Tramadol Anaphylaxis   ??? Metformin Diarrhea   ??? Trulicity [Dulaglutide] Nausea Only     Current Outpatient Medications on File Prior to Visit   Medication Sig Dispense Refill   ??? ramipriL (ALTACE) 10 mg capsule Take 1 capsule by mouth once daily 90  Cap 0   ??? [DISCONTINUED] venlafaxine-SR (EFFEXOR-XR) 75 mg capsule TAKE 3 CAPSULES BY MOUTH ONCE DAILY  90 Cap 0   ??? buPROPion SR (WELLBUTRIN SR) 150 mg SR tablet Take 1 Tab by mouth two (2) times a day. 60 Tab 1   ??? omega-3 acid ethyl esters (Lovaza) 1 gram capsule Take 2 Caps by mouth two (2) times a day for 360 days. 120 Cap 11   ??? ibuprofen (MOTRIN) 600 mg tablet Take 1 Tab by mouth every six (6) hours as needed for Pain. 20 Tab 0   ??? ondansetron (Zofran ODT) 4 mg disintegrating tablet Take 1 Tab by mouth every eight (8) hours as needed for Nausea or Vomiting. 8 Tab 0   ??? [DISCONTINUED] insulin NPH (NOVOLIN N, HUMULIN N) 100 unit/mL injection 50 Units in the AM, 50 Units in the PM 1 Vial 5   ??? gabapentin (NEURONTIN) 400 mg capsule 800 mg PO TID 180 Cap 3   ??? ALPRAZolam (Xanax) 0.5 mg tablet Take 0.5 Tabs by mouth two (2) times daily as needed for Anxiety. Max Daily Amount: 0.5 mg. (Patient taking differently: Take 0.25 mg by mouth two (2) times daily as needed for Anxiety.) 30 Tab 2   ??? tiZANidine (ZANAFLEX) 2 mg tablet Take 1 Tab by mouth three (3) times daily as needed for Muscle Spasm(s). 30 Tab 2   ??? glucose blood VI test strips (FreeStyle Lite Strips) strip Use as directed to check blood sugar 4 times a day 100 Strip 1   ??? atorvastatin (LIPITOR) 10 mg tablet Take 1 Tab by mouth daily. 90 Tab 1   ??? cholecalciferol (VITAMIN D3) (2,000 UNITS /50 MCG) cap capsule Take 2,000 Units by mouth daily. 90 Cap 3   ??? flash glucose scanning reader (FreeStyle Libre 14 Day Reader) misc Used as directed to check blood sugar 4 times a day 1 Each 0   ??? flash glucose sensor (FreeStyle Libre 14 Day Sensor) kit Use as directed to check blood sugar 4 times a day 1 Kit 5   ??? diclofenac EC (VOLTAREN) 75 mg EC tablet Take 1 Tab by mouth two (2) times a day. 30 Tab 3   ??? albuterol (PROVENTIL HFA, VENTOLIN HFA, PROAIR HFA) 90 mcg/actuation inhaler Take 2 Puffs by inhalation every six (6) hours as needed for Wheezing. 1 Inhaler 3   ???  nystatin (MYCOSTATIN) powder Apply to rash 4 times a day 60 g 3   ??? ketoconazole (NIZORAL) 2 % topical cream Apply  to affected area two (2) times a day. 60 g 3   ??? letrozole (FEMARA) 2.5 mg tablet Take 2.5 mg by mouth daily.       No current facility-administered medications on file prior to visit.      Health Maintenance Due   Topic Date Due   ??? PAP AKA CERVICAL CYTOLOGY  11/27/1988   ??? Shingrix Vaccine Age 38> (1 of 2) 11/27/2017   ??? Colorectal Cancer Screening Combo  11/27/2017         Objective     No flowsheet data found.       Observations based on audio-only communication:   Alert and oriented to person, place, and time.   Voice normal.   No cough or labored breathing audible during encounter.  Mood, affect, cognition, and memory normal.    Labs:     Assessment and Plan     1. DM type 2, uncontrolled, with neuropathy ([place])  Reviewed preferred drug list from her insurance provider. NPH is tier 2 and she states it is hard for her to  afford it. humalog 75/25 is tier 1. Will switch. Stop NPH. Start humalog 75/25. Start with 25 units before breakfast and dinner.   Continue checking glucose 4 times a day and keep a log. Then follow up with me in one week to adjust the dose.   - insulin lispro protamin-lispro (HUMALOG 75-25 MIX) flexpen; 25 Units by SubCUTAneous route Before breakfast and dinner.  Dispense: 30 mL; Refill: 5    2. Hot flashes due to menopause  3. Depression with anxiety  Fairly controlled. Continue current meds.   - venlafaxine-SR (EFFEXOR-XR) 75 mg capsule; Take 3 Caps by mouth daily.  Dispense: 90 Cap; Refill: 2                 I affirm this is a Patient-Initiated Episode with a Patient who has not had a related appointment within my department in the past 7 days or scheduled within the next 24 hours.    Total Time: minutes: 11-20 minutes    Note: not billable if this call serves to triage the patient into an appointment for the relevant concern    Signed electronically by Lawrence Marseilles,  DNP, FNP-BC

## 2019-08-17 NOTE — Telephone Encounter (Signed)
Please call her: I ordered a new insulin which is on your insurance list for tier one meds. Humalog 75/25 pen. Start with 25 units before breakfast and dinner.   Continue checking glucose 4 times a day and keep a log. Then follow up with me in one week to adjust the dose.

## 2019-08-17 NOTE — Progress Notes (Signed)
Barbara [name] is a 53 y.o. female evaluated via audio only technology on 08/17/2019.      Consent: She and/or her health care decision maker is aware that she may receive a bill for this audio only encounter, depending on her insurance coverage, and has provided verbal consent to proceed: yes    I communicated with the patient and/or health care decision maker about the nature and details of the following:    Subjective:   Barbara [name] is a 53 y.o. female who was seen for follow up.     HPI    Diabetes, type 2 follow up:  A1C is 9.2 down from 10.3.  Taking medications as prescribed: Has not taken insulin in the last 2 days. The NPH insulin is not covered by her insurance.  Following diabetic diet: doing better  Checking blood sugar: has her glucometer, but has not checked the last few days.   Symptoms of hyperglycemia: none  Symptoms of hypoglycemia: none      Review of Systems   Constitutional: Negative for appetite change, diaphoresis, fatigue and unexpected weight change.   Eyes: Negative for visual disturbance.   Respiratory: Negative for cough, chest tightness and shortness of breath.    Cardiovascular: Negative for chest pain, palpitations and leg swelling.   Gastrointestinal: Negative for abdominal distention, abdominal pain, blood in stool, constipation, diarrhea, nausea, rectal pain and vomiting.   Endocrine: Negative for polydipsia, polyphagia and polyuria.   Genitourinary: Negative for decreased urine volume, dysuria and frequency.   Musculoskeletal: Negative for joint swelling and myalgias.   Skin: Negative for rash and wound.   Neurological: Negative for dizziness, weakness, light-headedness, numbness and headaches.   Psychiatric/Behavioral: Positive for dysphoric mood. Negative for sleep disturbance and suicidal ideas. The patient is nervous/anxious.        Patient Active Problem List   Diagnosis Code   ??? Essential hypertension I10    ??? DM type 2, uncontrolled, with neuropathy ([place]) E11.40, E11.65   ??? Pain of left hip joint M25.552   ??? Low back pain M54.5   ??? Smoker F17.200   ??? Dupuytren contracture M72.0   ??? S/P mastectomy, left Z90.12   ??? Cigarette nicotine dependence without complication U93.235   ??? Recurrent depression (HCC) F33.9   ??? History of breast cancer Z85.3   ??? Hypertensive retinopathy H35.039   ??? Aneurysm (HCC) I72.9   ??? 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 DDD (degenerative disc disease), cervical M50.30   ??? Peripheral vascular disease (HCC) I73.9   ??? Carotid artery stenosis I65.29   ??? Vertebral artery stenosis I65.09     Past Medical History:   Diagnosis Date   ??? Aneurysm ([place]) 2020    right MCA 3-4 mm and left ICA carotid cave 3 mm   ??? Atherosclerosis 2020   ??? Back pain    ??? Breast cancer ([place]) 2008 or 2009   ??? Carotid artery stenosis     2020 Dr. Constance Goltz, plans to repeat US in 2 years    ??? Chronic pain    ??? DDD (degenerative disc disease), cervical     multilevel   ??? Diabetes ([place])    ??? Facet joint disease of cervical region    ??? FH: chemotherapy    ??? Hypertension    ??? Hypertensive retinopathy 2020   ??? Nausea & vomiting    ??? Neuropathy    ??? Peripheral  vascular disease ([place]) 2020    LLE Dr. Constance Goltz   ??? Radiation therapy complication 5409    NOT complication, just radiation   ??? Vertebral artery stenosis     2020, per Dr. Constance Goltz - mild     Patient Care Team:  Toniann Ket, NP as PCP - General (Nurse Practitioner)  Isaiah Blakes Kerrin Champagne, NP as PCP - [facility] Empaneled Provider  Anastasio Auerbach., RN as Nurse Navigator (Oncology)  Reita Cliche, MD (Breast Surgery)  Lilian Coma, MD as Physician (Physical Medicine and Rehabilitation)  Constance Goltz Brynda Peon, MD (Vascular Surgery)    Past Surgical History:   Procedure Laterality Date   ??? BIOPSY BREAST     ??? BIOPSY FINE NEEDLE     ??? HX BREAST AUGMENTATION Right 2010   ??? HX BREAST AUGMENTATION Left 09/17/2017     left breast reconstruction with exchange of tissue expander per breast prostesis and left breast tissue expander performed by [name], MD at [place]   ??? HX BREAST AUGMENTATION Right 04/01/2018    removal of tissue expanders with placment of right permanent breast prostesis performed by [name], MD at [place]   ??? HX BREAST RECONSTRUCTION Right 2011    per pt - attempted latissimus dorsi flap, then saline implant   ??? HX BREAST RECONSTRUCTION Bilateral 06/10/2017    right breast prosthesis and capsule removal and bilateral breast tissue  expander reconstruction.  performed by [name], MD at [place]   ??? HX BREAST RECONSTRUCTION Left 07/09/2017    left breast  EXCISION and closure of wound performed by [name], MD at [place]   ??? HX BREAST RECONSTRUCTION Bilateral     Tissue expanders, to prepare for implants    ??? HX CESAREAN SECTION     ??? HX LUMBAR DISKECTOMY      herniated disc   ??? HX MASTECTOMY Right 2010    Total mastectomy per pt.     ??? HX MASTECTOMY Left 06/10/2017    left mastectomy,Sentinal node biopsies. performed by [name], [facility]   ??? HX MASTECTOMY Bilateral    ??? HX MASTOPEXY (BREAST LIFT) Left 2011   ??? IMPLANT BREAST SILICONE/EQ Right 8119    post mastectomy   ??? THUMB SUPPORT      thumb surgery to mobilize thumb     Family History   Problem Relation Age of Onset   ??? Diabetes Mother    ??? No Known Problems Father      Social History     Tobacco Use   ??? Smoking status: Current Some Day Smoker     Packs/day: 0.50     Years: 15.00     Pack years: 7.50     Types: Cigarettes   ??? Smokeless tobacco: Never Used   ??? Tobacco comment: 5-6 cigs per day patient education given   Substance Use Topics   ??? Alcohol use: No   ??? Drug use: No     Allergies   Allergen Reactions   ??? Tramadol Anaphylaxis   ??? Metformin Diarrhea   ??? Trulicity [Dulaglutide] Nausea Only     Current Outpatient Medications on File Prior to Visit   Medication Sig Dispense Refill    ??? ramipriL (ALTACE) 10 mg capsule Take 1 capsule by mouth once daily 90 Cap 0   ??? [DISCONTINUED] venlafaxine-SR (EFFEXOR-XR) 75 mg capsule TAKE 3 CAPSULES BY MOUTH ONCE DAILY  90 Cap 0   ??? buPROPion SR (WELLBUTRIN SR) 150 mg SR tablet Take 1 Tab by mouth two (2) times a day. 60 Tab 1   ??? omega-3 acid ethyl esters (Lovaza) 1 gram capsule Take 2 Caps by mouth two (2) times a day for 360 days. 120 Cap 11   ??? ibuprofen (MOTRIN) 600 mg tablet Take 1 Tab by mouth every six (6) hours as needed for Pain. 20 Tab 0   ??? ondansetron (Zofran ODT) 4 mg disintegrating tablet Take 1 Tab by mouth every eight (8) hours as needed for Nausea or Vomiting. 8 Tab 0   ??? [DISCONTINUED] insulin NPH (NOVOLIN N, HUMULIN N) 100 unit/mL injection 50 Units in the AM, 50 Units in the PM 1 Vial 5   ??? gabapentin (NEURONTIN) 400 mg capsule 800 mg PO TID 180 Cap 3   ??? ALPRAZolam (Xanax) 0.5 mg tablet Take 0.5 Tabs by mouth two (2) times daily as needed for Anxiety. Max Daily Amount: 0.5 mg. (Patient taking differently: Take 0.25 mg by mouth two (2) times daily as needed for Anxiety.) 30 Tab 2   ??? tiZANidine (ZANAFLEX) 2 mg tablet Take 1 Tab by mouth three (3) times daily as needed for Muscle Spasm(s). 30 Tab 2   ??? glucose blood VI test strips (FreeStyle Lite Strips) strip Use as directed to check blood sugar 4 times a day 100 Strip 1   ??? atorvastatin (LIPITOR) 10 mg tablet Take 1 Tab by mouth daily. 90 Tab 1   ??? cholecalciferol (VITAMIN D3) (2,000 UNITS /50 MCG) cap capsule Take 2,000 Units by mouth daily. 90 Cap 3   ??? flash glucose scanning reader (FreeStyle Libre 14 Day Reader) misc Used as directed to check blood sugar 4 times a day 1 Each 0   ??? flash glucose sensor (FreeStyle Libre 14 Day Sensor) kit Use as directed to check blood sugar 4 times a day 1 Kit 5   ??? diclofenac EC (VOLTAREN) 75 mg EC tablet Take 1 Tab by mouth two (2) times a day. 30 Tab 3   ??? albuterol (PROVENTIL HFA, VENTOLIN HFA, PROAIR HFA) 90 mcg/actuation  inhaler Take 2 Puffs by inhalation every six (6) hours as needed for Wheezing. 1 Inhaler 3   ??? nystatin (MYCOSTATIN) powder Apply to rash 4 times a day 60 g 3   ??? ketoconazole (NIZORAL) 2 % topical cream Apply  to affected area two (2) times a day. 60 g 3   ??? letrozole (FEMARA) 2.5 mg tablet Take 2.5 mg by mouth daily.       No current facility-administered medications on file prior to visit.      Health Maintenance Due   Topic Date Due   ??? PAP AKA CERVICAL CYTOLOGY  11/27/1988   ??? Shingrix Vaccine Age 77> (1 of 2) 11/27/2017   ??? Colorectal Cancer Screening Combo  11/27/2017         Objective     No flowsheet data found.       Observations based on audio-only communication:   Alert and oriented to person, place, and time.   Voice normal.   No cough or labored breathing audible during encounter.  Mood, affect, cognition, and memory normal.    Labs:     Assessment and Plan     1. DM type 2, uncontrolled, with neuropathy ([place])  Reviewed preferred drug list from her insurance provider. NPH is tier 2 and she states it is hard for her to  afford it. humalog 75/25 is tier 1. Will switch. Stop NPH. Start humalog 75/25. Start with 25 units before breakfast and dinner.   Continue checking glucose 4 times a day and keep a log. Then follow up with me in one week to adjust the dose.   - insulin lispro protamin-lispro (HUMALOG 75-25 MIX) flexpen; 25 Units by SubCUTAneous route Before breakfast and dinner.  Dispense: 30 mL; Refill: 5    2. Hot flashes due to menopause  3. Depression with anxiety  Fairly controlled. Continue current meds.   - venlafaxine-SR (EFFEXOR-XR) 75 mg capsule; Take 3 Caps by mouth daily.  Dispense: 90 Cap; Refill: 2                 I affirm this is a Patient-Initiated Episode with a Patient who has not had a related appointment within my department in the past 7 days or scheduled within the next 24 hours.    Total Time: minutes: 11-20 minutes     Note: not billable if this call serves to triage the patient into an appointment for the relevant concern    Signed electronically by Lawrence Marseilles, DNP, FNP-BC

## 2019-08-18 NOTE — Telephone Encounter (Signed)
Called patient told about insulin and to keep a record of BS.

## 2019-08-20 ENCOUNTER — Encounter: Payer: PRIVATE HEALTH INSURANCE | Attending: Surgery | Primary: Nurse Practitioner

## 2019-08-20 NOTE — Progress Notes (Deleted)
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 Ronnald Ramp 06/10/17 as well as a remote history of right breast cancer in 2008, s/p mastectomy.  Her Oncotype returned as 13 with an 8% 5 year recurrence with Tamoxifen alone. She has been started on ovarian suppression and aromatase inhibitor (Lupron and Femara) per Dr. Brent General.     She initially presented August 2018  for evaluation regarding a new diagnosis of a left breast cancer.  The patient had a recent left breast mammogram that showed an area of calcifications and possibly small mass in about the 1130 position 3 cm from the nipple measuring about 1.8 cm.  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  The tumor was estrogen and progesterone receptor positive, HER-2 negative.    Of note in 2008 the patient developed right breast cancer.  She believes she had gene testing at that time when she was 53 years old.  She underwent a mastectomy and had neoadjuvant chemotherapy.  Subsequently she had radiation therapy.  She apparently had some latissimus dorsi flap reconstruction but this failed and then subsequent to that she had an implant.  Subsequent to that she had a reduction mammoplasty of the left breast.  She has not been aware of any recurrent disease since her first cancer until the development of this new cancer in the other breast.  She states a pill was not recommended at that time.    She underwent revision of scar with excision of necrotic tissue and removal of expander by Dr. Ronnald Ramp 07/09/17 and subsequent submuscular placement 09/17/17 followed by removal of tissue expanders on both sides with placement of right permanent breast prosthesis 04/01/18.      She is taking Femara and Lupron per Dr. Brent General. She is overall doing well and is without complaint related to her breast.  ***        Allergies   Allergen Reactions   ??? Tramadol Anaphylaxis   ??? Metformin Diarrhea   ??? Trulicity [Dulaglutide] Nausea Only     Past Medical History:   Diagnosis Date   ??? Aneurysm ([place]) 2020    right MCA 3-4 mm and left ICA carotid cave 3 mm   ??? Atherosclerosis 2020   ??? Back pain    ??? Breast cancer ([place]) 2008 or 2009   ??? Carotid artery stenosis     2020 Dr. Constance Goltz, plans to repeat US in 2 years    ??? Chronic pain    ??? DDD (degenerative disc disease), cervical     multilevel   ??? Diabetes ([place])    ??? Facet joint disease of cervical region    ??? FH: chemotherapy    ??? Hypertension    ??? Hypertensive retinopathy 2020   ??? Nausea & vomiting    ??? Neuropathy    ??? Peripheral vascular disease ([place]) 2020    LLE Dr. Constance Goltz   ??? Radiation therapy complication 0102    NOT complication, just radiation   ??? Vertebral artery stenosis     2020, per Dr. Constance Goltz - mild     Past Surgical History:   Procedure Laterality Date   ??? BIOPSY BREAST     ??? BIOPSY FINE NEEDLE     ??? HX  BREAST AUGMENTATION Right 2010   ??? HX BREAST AUGMENTATION Left 09/17/2017    left breast reconstruction with exchange of tissue expander per breast prostesis and left breast tissue expander performed by [name], MD at [place]   ??? HX BREAST AUGMENTATION Right 04/01/2018    removal of tissue expanders with placment of right permanent breast prostesis performed by [name], MD at [place]   ??? HX BREAST RECONSTRUCTION Right 2011    per pt - attempted latissimus dorsi flap, then saline implant   ??? HX BREAST RECONSTRUCTION Bilateral 06/10/2017    right breast prosthesis and capsule removal and bilateral breast tissue  expander reconstruction.  performed by [name], MD at [place]   ??? HX BREAST RECONSTRUCTION Left 07/09/2017    left breast  EXCISION and closure of wound performed by [name], MD at [place]   ??? HX BREAST RECONSTRUCTION Bilateral     Tissue expanders, to prepare for implants    ??? HX CESAREAN SECTION     ??? HX LUMBAR DISKECTOMY      herniated disc   ??? HX MASTECTOMY Right 2010    Total mastectomy per pt.     ??? HX MASTECTOMY Left 06/10/2017    left mastectomy,Sentinal node biopsies. performed by [name], MD at [facility] MAIN OR   ??? HX MASTECTOMY Bilateral    ??? HX MASTOPEXY (BREAST LIFT) Left 2011   ??? IMPLANT BREAST SILICONE/EQ Right 2536    post mastectomy   ??? THUMB SUPPORT      thumb surgery to mobilize thumb     Social History     Socioeconomic History   ??? Marital status: MARRIED     Spouse name: Not on file   ??? Number of children: Not on file   ??? Years of education: Not on file   ??? Highest education level: Not on file   Occupational History   ??? Occupation: Psychologist, sport and exercise   Tobacco Use   ??? Smoking status: Current Some Day Smoker     Packs/day: 0.50     Years: 15.00     Pack years: 7.50     Types: Cigarettes   ??? Smokeless tobacco: Never Used   ??? Tobacco comment: 5-6 cigs per day patient education given   Substance and Sexual Activity   ??? Alcohol use: No   ??? Drug use: No   Other Topics Concern     REVIEW OF SYSTEMS     Constitutional: No fever, weight loss, fatigue or recent chills.   Skin:  No recent rashes, dermatitis or abnormal moles.   HEENT:  No changes in vision, vertigo, epistaxis, dysphasia, or hoarseness.   Cardiac:  No chest pain, palpitations, or edema.    Respiratory: No chronic cough, shortness of breath, wheezing, hemoptysis, or history of sleep apnea.  Patient has been a longtime smoker and still smokes about 1 pack of cigarettes per day-probable mild COPD   Breasts/GYN:   See the history of present illness   Gastrointestinal:  No significant food intolerances, no recent vomiting, no chronic abdominal pain, no change in bowel habits, no melena. No history of GERD.   Genitourinary:  No history of hematuria, dysuria, frequency, or stress  urinary incontinence. No nocturia.   Musculoskeletal: No weakness, joint pains, or arthritis.  Patient does have a history of chronic back pain   Endocrine:  No history of thyroid disease.  History of insulin-dependent diabetes with  neuropathy.  Patient's previous hemoglobin A1c was over 8   Lymph/hemo:  No history of blood transfusions or easy bruising. No anemia.  Patient states that she had "blood clots" with previous surgeries but she pointed to her chest   Neuro: No dizziness or headaches or fainting.  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    PHYSICAL EXAM    There were no vitals taken for this visit.    Gen:  No distress  Head: normocephalic, atraumatic  Mouth: Clear, no overt lesions, oral mucosa pink and moist  Neck: supple, no masses, no adenopathy, trachea midline  Resp: clear bilaterally  Cardio: Regular rate and rhythm  Abdomen: soft, nontender, nondistended  Extremeties: warm, well-perfused  Neuro: sensation and strength grossly intact and symmetrical  Psych: alert and oriented to person, place and time  Breasts:   Right: Examined in both the supine and upright positions.  There was no supraclavicular, infraclavicular, or axillary lympadenopathy.   There were no dominant masses, no skin changes, no asymmetry identified ***  Left: Examined in both the supine and upright positions.  There was no supraclavicular, infraclavicular, or axillary lympadenopathy.   There were no dominant masses, no skin changes, no asymmetry identified ***'    Imaging:  01/04/19 CT/bone scan  1. No evidence for metastatic disease within the chest, abdomen, or pelvis.  ??  2. Cholelithiasis.  ??  No evidence of osseous metastatic disease.    Pathology:  06/10/17   A: SENTINEL LYMPH NODE #1, LEFT AXILLA, BIOPSY:   ONE LYMPH NODE, NEGATIVE FOR MALIGNANCY (0/1).   B: SENTINEL LYMPH NODE #2, LEFT AXILLA, BIOPSY:   ONE LYMPH NODE, NEGATIVE FOR MALIGNANCY (0/1).   C: LEFT BREAST, SIMPLE MASTECTOMY:    INVASIVE CARCINOMA OF THE BREAST.   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   Nuclear Grade: Grade II (intermediate)   Lobular Carcinoma In Situ (LCIS): Present   Tumor Extent   Nipple DCIS: DCIS does not involve the nipple epidermis   Accessory Findings   Treatment Effect in the Breast: No known presurgical therapy   Treatment Effect in the Lymph Nodes: No known presurgical therapy   MARGINS   Invasive Carcinoma Margins: Uninvolved by invasive carcinoma   Distance from Closest Margin in Millimeters: Distance is > 10 Millimeters   Closest Margin: Posterior   DCIS Margins: Uninvolved by DCIS   Distance of DCIS from Closest Margin in Millimeters: Distance is > 10 Millimeters   Closest Margin: Posterior   LYMPH NODES   Number of Lymph Nodes with Macrometastases (> 2 mm): 0   Number of Lymph Nodes with Micrometastases (> 0.2 mm to 2 mm and / or > 200 cells): 0   Number of Lymph Nodes with Isolated Tumor Cells (<= 0.2 mm and <= 200 cells): 0   Number of Lymph Nodes Examined: 2 (see above)   Number of Sentinel Nodes Examined: 2   PATHOLOGIC STAGE CLASSIFICATION (pTNM, AJCC 8th Edition)   Primary Tumor (Invasive Carcinoma) (pT): pT1c   Regional Lymph Nodes (  pN)   Modifier: (sn): Only sentinel node(s) evaluated.   Category (pN): pN0   D: RIGHT BREAST, CAPSULECTOMY:   BENIGN SOFT TISSUE IDENTIFIED.   E: IMPLANT, RIGHT BREAST, REMOVAL:   BREAST PROSTHESIS IDENTIFIED (GROSS EXAMINATION ONLY).       05/16/17   LEFT BREAST MASS AT 11:30 O'CLOCK (2-3 CM FROM NIPPLE), CORE BIOPSIES:    INFILTRATING CARCINOMA, NUCLEAR GRADE 1, WITH MIXED DUCTAL AND LOBULAR   FEATURES.   ESTROGEN RECEPTOR: POSITIVE (100%) POSITIVE > 1%   PROGESTERONE RECEPTOR: POSITIVE (100%) POSITIVE > 1%   Negative for Her-2 amplification       Patient Active Problem List   Diagnosis Code   ??? Essential hypertension I10   ??? DM type 2, uncontrolled, with neuropathy ([place]) E11.40, E11.65   ??? Pain of left hip joint M25.552   ??? Low back pain M54.5   ??? Smoker F17.200   ??? Dupuytren contracture M72.0   ??? S/P mastectomy, left Z90.12   ??? Cigarette nicotine dependence without complication P38.250   ??? Recurrent depression (HCC) F33.9   ??? History of breast cancer Z85.3   ??? Hypertensive retinopathy H35.039   ??? Aneurysm (HCC) I72.9   ??? 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 DDD (degenerative disc disease), cervical M50.30   ??? Peripheral vascular disease (HCC) I73.9   ??? Carotid artery stenosis I65.29   ??? 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 Ronnald Ramp 06/10/17 as well as a history of right breast cancer in 2008. Continue ovarian suppression and aromatase inhibitor (Lupron and Femara) per Dr. Brent General. ***  Tobacco cessation again recommended. I have recommended annual bilateral breast exam.***  Please call sooner with questions or concerns.

## 2019-08-24 ENCOUNTER — Encounter: Attending: Nurse Practitioner | Primary: Nurse Practitioner

## 2019-08-30 NOTE — Telephone Encounter (Signed)
Barbara [name] from [place] is checking the status of paperwork for diabetic shoes and inserts on 08/16/2019 and she is also requestiing last diabetes assessment OV note. Please assist.

## 2019-09-02 NOTE — Telephone Encounter (Signed)
Have you seen her paperwork? I will fill it out tomorrow if you can help me find it. Thanks!
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
## 2019-09-15 ENCOUNTER — Telehealth: Attending: Nurse Practitioner | Primary: Nurse Practitioner

## 2019-09-15 ENCOUNTER — Telehealth
Admit: 2019-09-15 | Discharge: 2019-09-15 | Payer: PRIVATE HEALTH INSURANCE | Attending: Nurse Practitioner | Primary: Nurse Practitioner

## 2019-09-15 DIAGNOSIS — E1140 Type 2 diabetes mellitus with diabetic neuropathy, unspecified: Secondary | ICD-10-CM

## 2019-09-15 DIAGNOSIS — IMO0002 Reserved for concepts with insufficient information to code with codable children: Secondary | ICD-10-CM

## 2019-09-15 MED ORDER — TIZANIDINE 2 MG TAB
2 mg | ORAL_TABLET | Freq: Three times a day (TID) | ORAL | 2 refills | Status: AC | PRN
Start: 2019-09-15 — End: ?

## 2019-09-15 MED ORDER — GABAPENTIN 400 MG CAP
400 mg | ORAL_CAPSULE | ORAL | 3 refills | Status: AC
Start: 2019-09-15 — End: ?

## 2019-09-15 MED ORDER — ATORVASTATIN 10 MG TAB
10 mg | ORAL_TABLET | Freq: Every day | ORAL | 1 refills | Status: AC
Start: 2019-09-15 — End: ?

## 2019-09-15 MED ORDER — ALPRAZOLAM 0.5 MG TAB
0.5 mg | ORAL_TABLET | Freq: Two times a day (BID) | ORAL | 2 refills | Status: AC | PRN
Start: 2019-09-15 — End: ?

## 2019-09-15 MED ORDER — VENLAFAXINE SR 75 MG 24 HR CAP
75 mg | ORAL_CAPSULE | Freq: Every day | ORAL | 2 refills | Status: AC
Start: 2019-09-15 — End: ?
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
## 2019-10-06 ENCOUNTER — Encounter

## 2019-10-06 NOTE — Telephone Encounter (Signed)
Requested Prescriptions     Pending Prescriptions Disp Refills   ??? gabapentin (NEURONTIN) 400 mg capsule 180 Cap 3     Sig: 800 mg PO TID     Pharmacy unable to refill until call pharmacy because of Dewy Rose 12/21

## 2019-10-06 NOTE — Telephone Encounter (Signed)
I called and spoke with pharmacist to authorize the refill.

## 2019-10-06 NOTE — Telephone Encounter (Signed)
Requested Prescriptions     Pending Prescriptions Disp Refills   ??? gabapentin (NEURONTIN) 400 mg capsule 180 Cap 3     Sig: 800 mg PO TID     Done thank you

## 2021-02-06 ENCOUNTER — Ambulatory Visit (HOSPITAL_COMMUNITY): Payer: Self-pay | Admitting: NURSE PRACTITIONER

## 2021-12-27 ENCOUNTER — Other Ambulatory Visit: Payer: Self-pay

## 2021-12-27 ENCOUNTER — Encounter (HOSPITAL_BASED_OUTPATIENT_CLINIC_OR_DEPARTMENT_OTHER): Payer: Self-pay

## 2021-12-27 ENCOUNTER — Emergency Department
Admission: EM | Admit: 2021-12-27 | Discharge: 2021-12-28 | Disposition: A | Discharge status: Home / Self Care | Payer: Medicaid Other | Attending: Emergency Medicine | Admitting: Emergency Medicine

## 2021-12-27 DIAGNOSIS — Z9049 Acquired absence of other specified parts of digestive tract: Secondary | ICD-10-CM | POA: Insufficient documentation

## 2021-12-27 DIAGNOSIS — R1032 Left lower quadrant pain: Secondary | ICD-10-CM | POA: Insufficient documentation

## 2021-12-27 DIAGNOSIS — F17210 Nicotine dependence, cigarettes, uncomplicated: Secondary | ICD-10-CM | POA: Insufficient documentation

## 2021-12-27 HISTORY — DX: Essential (primary) hypertension: I10

## 2021-12-27 HISTORY — DX: Type 2 diabetes mellitus without complications: E11.9

## 2021-12-27 HISTORY — DX: Malignant neoplasm of unspecified site of unspecified female breast: C50.919

## 2021-12-27 HISTORY — DX: Malignant (primary) neoplasm, unspecified: C80.1

## 2021-12-27 MED ORDER — FENTANYL (PF) 50 MCG/ML INJECTION SOLUTION
50.0000 ug | INTRAMUSCULAR | Status: AC
Start: 2021-12-28 — End: 2021-12-28
  Administered 2021-12-28: 50 ug via INTRAMUSCULAR

## 2021-12-27 NOTE — ED Triage Notes (Signed)
EMS reports pt was woke with lower left abd pain tonight. Pt does states some diarrhea, but no vomiting. PT states close to one of her post surgical wounds from gallbladder sx in feb.

## 2021-12-28 ENCOUNTER — Emergency Department (EMERGENCY_DEPARTMENT_HOSPITAL): Payer: Medicaid Other

## 2021-12-28 DIAGNOSIS — Z6820 Body mass index (BMI) 20.0-20.9, adult: Secondary | ICD-10-CM

## 2021-12-28 DIAGNOSIS — R1032 Left lower quadrant pain: Secondary | ICD-10-CM

## 2021-12-28 DIAGNOSIS — R109 Unspecified abdominal pain: Secondary | ICD-10-CM

## 2021-12-28 LAB — URINALYSIS, MACRO/MICRO
BILIRUBIN: NEGATIVE mg/dL
BLOOD: NEGATIVE mg/dL
GLUCOSE: >=1000 mg/dL — AB
KETONES: 40 mg/dL — AB
LEUKOCYTES: NEGATIVE WBCs/uL
NITRITE: NEGATIVE
PH: 6 (ref 4.6–8.0)
PROTEIN: NEGATIVE mg/dL
SPECIFIC GRAVITY: 1.01 (ref 1.003–1.035)
UROBILINOGEN: 0.2 mg/dL (ref 0.2–1.0)

## 2021-12-28 LAB — COMPREHENSIVE METABOLIC PANEL, NON-FASTING
ALBUMIN/GLOBULIN RATIO: 0.8 (ref 0.8–1.4)
ALBUMIN: 3.6 g/dL (ref 3.4–5.0)
ALKALINE PHOSPHATASE: 252 U/L — ABNORMAL HIGH (ref 46–116)
ALT (SGPT): 44 U/L (ref <=78)
ANION GAP: 11 mmol/L (ref 10–20)
AST (SGOT): 32 U/L (ref 15–37)
BILIRUBIN TOTAL: 0.5 mg/dL (ref 0.2–1.0)
BUN/CREA RATIO: 18
BUN: 10 mg/dL (ref 7–18)
CALCIUM, CORRECTED: 10.1 mg/dL
CALCIUM: 9.7 mg/dL (ref 8.5–10.1)
CHLORIDE: 103 mmol/L (ref 98–107)
CO2 TOTAL: 24 mmol/L (ref 21–32)
CREATININE: 0.56 mg/dL (ref 0.55–1.02)
ESTIMATED GFR: 108 mL/min/{1.73_m2} (ref >59)
GLOBULIN: 4.4
GLUCOSE: 313 mg/dL — ABNORMAL HIGH (ref 74–106)
OSMOLALITY, CALCULATED: 287 mOsm/kg (ref 270–290)
POTASSIUM: 3.9 mmol/L (ref 3.5–5.1)
PROTEIN TOTAL: 8 g/dL (ref 6.4–8.2)
SODIUM: 138 mmol/L (ref 136–145)

## 2021-12-28 LAB — CBC WITH DIFF
BASOPHIL #: 0.05 10*3/uL (ref 0.00–2.50)
BASOPHIL %: 0 % (ref 0–3)
EOSINOPHIL #: 0.23 10*3/uL (ref 0.00–2.40)
EOSINOPHIL %: 2 % (ref 0–7)
HCT: 47.9 % — ABNORMAL HIGH (ref 37.0–47.0)
HGB: 16.8 g/dL — ABNORMAL HIGH (ref 12.5–16.0)
LYMPHOCYTE #: 3.15 10*3/uL (ref 2.10–11.00)
LYMPHOCYTE %: 30 % (ref 25–45)
MCH: 31.5 pg (ref 27.0–32.0)
MCHC: 35 g/dL (ref 32.0–36.0)
MCV: 89.9 fL (ref 78.0–99.0)
MONOCYTE #: 0.71 10*3/uL (ref 0.00–4.10)
MONOCYTE %: 7 % (ref 0–12)
MPV: 7.8 fL (ref 7.4–10.4)
NEUTROPHIL #: 6.38 10*3/uL (ref 4.10–29.00)
NEUTROPHIL %: 61 % (ref 40–76)
PLATELETS: 289 10*3/uL (ref 140–440)
RBC: 5.33 10*6/uL (ref 4.20–5.40)
RDW: 16 % — ABNORMAL HIGH (ref 11.6–14.8)
WBC: 10.5 10*3/uL (ref 4.0–10.5)

## 2021-12-28 LAB — LIPASE: LIPASE: 98 U/L (ref 73–393)

## 2021-12-28 LAB — PT/INR
INR: 1.15 — ABNORMAL HIGH (ref 0.88–1.10)
PROTHROMBIN TIME: 12.2 seconds — ABNORMAL HIGH (ref 9.2–12.1)

## 2021-12-28 LAB — GOLD TOP TUBE

## 2021-12-28 MED ORDER — PROCHLORPERAZINE EDISYLATE 10 MG/2 ML (5 MG/ML) INJECTION SOLUTION
10.0000 mg | INTRAMUSCULAR | Status: AC
Start: 2021-12-28 — End: 2021-12-28
  Administered 2021-12-28: 10 mg via INTRAMUSCULAR

## 2021-12-28 MED ORDER — KETOROLAC 60 MG/2 ML INTRAMUSCULAR SOLUTION
60.0000 mg | INTRAMUSCULAR | Status: AC
Start: 2021-12-28 — End: 2021-12-28
  Administered 2021-12-28: 60 mg via INTRAMUSCULAR

## 2021-12-28 MED ORDER — FENTANYL (PF) 50 MCG/ML INJECTION SOLUTION
INTRAMUSCULAR | Status: AC
Start: 2021-12-28 — End: 2021-12-28
  Filled 2021-12-28: qty 2

## 2021-12-28 MED ORDER — PROCHLORPERAZINE EDISYLATE 10 MG/2 ML (5 MG/ML) INJECTION SOLUTION
INTRAMUSCULAR | Status: AC
Start: 2021-12-28 — End: 2021-12-28
  Filled 2021-12-28: qty 2

## 2021-12-28 MED ORDER — KETOROLAC 60 MG/2 ML INTRAMUSCULAR SOLUTION
INTRAMUSCULAR | Status: AC
Start: 2021-12-28 — End: 2021-12-28
  Filled 2021-12-28: qty 2

## 2021-12-28 NOTE — ED Attending Note (Signed)
[facility] emergency department         HISTORY OF PRESENT ILLNESS     Date:  12/28/2021  Patient's Name:  Gail Morris  Date of Birth:  [birth year]    Patient with history of cholecystectomy in her referral for presents with onset of abdominal pain left lower quadrant about 2 hours prior to coming to the ER positive nausea no vomiting no diarrhea patient seems very uncomfortable while being evaluated.  She denies any kidney stones no hematuria          Review of Systems     Review of Systems   HENT: Negative.    Eyes: Negative.    Respiratory: Negative.    Cardiovascular: Negative.    Gastrointestinal: Positive for abdominal pain.   Musculoskeletal: Negative.    Skin: Negative.    Psychiatric/Behavioral: Negative.    All other systems reviewed and are negative.      Previous History     Past Medical History:  Past Medical History:   Diagnosis Date   . Breast cancer (CMS [place])    . Cancer (CMS [place])    . Diabetes mellitus, type 2 (CMS [facility])    . HTN (hypertension)        Past Surgical History:  Past Surgical History:   Procedure Laterality Date   . Hx cholecystectomy     . Simple mastectomy Bilateral        Social History:  Social History     Tobacco Use   . Smoking status: Every Day     Packs/day: 1.00     Types: Cigarettes   Substance Use Topics   . Alcohol use: Never   . Drug use: Never     Social History     Substance and Sexual Activity   Drug Use Never       Family History:  No family history on file.    Medication History:  Current Outpatient Medications   Medication Sig   . empagliflozin (JARDIANCE) 25 mg Oral Tablet Take 1 Tablet (25 mg total) by mouth Once a day   . gabapentin (NEURONTIN) 400 mg Oral Capsule Take 2 Capsules (800 mg total) by mouth Three times a day   . insulin glargine 100 unit/mL Subcutaneous injection (vial) Inject under the skin Every night   . ramipriL (ALTACE) 10 mg Oral Capsule Take 1 Capsule (10 mg total) by mouth Once a day        Allergies:  Allergies   Allergen Reactions   . Metformin    . Tramadol    . Trulicity [Dulaglutide]        Physical Exam     Vitals:    BP (!) 140/109   Pulse 94   Temp 36.7 C (98.1 F)   Resp 20   Ht 1.575 m ('5\' 2"'$ )   Wt 50.8 kg (112 lb)   SpO2 95%   BMI 20.49 kg/m           Physical Exam  Vitals and nursing note reviewed.   Constitutional:       General: She is not in acute distress.     Appearance: She is well-developed.   HENT:      Head: Normocephalic and atraumatic.   Eyes:      Conjunctiva/sclera: Conjunctivae normal.   Cardiovascular:      Rate and Rhythm: Normal rate and regular rhythm.      Heart sounds: No murmur  heard.  Pulmonary:      Effort: Pulmonary effort is normal. No respiratory distress.      Breath sounds: Normal breath sounds.   Abdominal:      Palpations: Abdomen is soft.      Tenderness: There is no abdominal tenderness.   Musculoskeletal:         General: No swelling.      Cervical back: Neck supple.   Skin:     General: Skin is warm and dry.      Capillary Refill: Capillary refill takes less than 2 seconds.   Neurological:      Mental Status: She is alert.   Psychiatric:         Mood and Affect: Mood normal.         Diagnostic Studies/Treatment     Medications:  Medications Administered in the ED   ketorolac (TORADOL) '60mg'$ /2 mL IM injection (has no administration in time range)   prochlorperazine (COMPAZINE) 5 mg/mL injection (has no administration in time range)   fentaNYL (SUBLIMAZE) 50 mcg/mL injection (50 mcg IntraMUSCULAR Given 12/28/21 0009)       New Prescriptions    No medications on file       Labs:    Results for orders placed or performed during the hospital encounter of 12/27/21 (from the past 12 hour(s))   LIPASE   Result Value Ref Range    LIPASE 98 73 - 393 U/L   PT/INR   Result Value Ref Range    PROTHROMBIN TIME 12.2 (H) 9.2 - 12.1 seconds    INR 1.15 (H) 0.88 - 1.10   COMPREHENSIVE METABOLIC PANEL, NON-FASTING   Result Value Ref Range    SODIUM 138 136 - 145  mmol/L    POTASSIUM 3.9 3.5 - 5.1 mmol/L    CHLORIDE 103 98 - 107 mmol/L    CO2 TOTAL 24 21 - 32 mmol/L    ANION GAP 11 10 - 20 mmol/L    BUN 10 7 - 18 mg/dL    CREATININE 0.56 0.55 - 1.02 mg/dL    BUN/CREA RATIO 18     ESTIMATED GFR 108 >59 mL/min/1.25m2    ALBUMIN 3.6 3.4 - 5.0 g/dL    CALCIUM 9.7 8.5 - 10.1 mg/dL    GLUCOSE 313 (H) 74 - 106 mg/dL    ALKALINE PHOSPHATASE 252 (H) 46 - 116 U/L    ALT (SGPT) 44 <=78 U/L    AST (SGOT) 32 15 - 37 U/L    BILIRUBIN TOTAL 0.5 0.2 - 1.0 mg/dL    PROTEIN TOTAL 8.0 6.4 - 8.2 g/dL    ALBUMIN/GLOBULIN RATIO 0.8 0.8 - 1.4    OSMOLALITY, CALCULATED 287 270 - 290 mOsm/kg    CALCIUM, CORRECTED 10.1 mg/dL    GLOBULIN 4.4    CBC WITH DIFF   Result Value Ref Range    WBCS UNCORRECTED      WBC 10.5 4.0 - 10.5 x10^3/uL    RBC 5.33 4.20 - 5.40 x10^6/uL    HGB 16.8 (H) 12.5 - 16.0 g/dL    HCT 47.9 (H) 37.0 - 47.0 %    MCV 89.9 78.0 - 99.0 fL    MCH 31.5 27.0 - 32.0 pg    MCHC 35.0 32.0 - 36.0 g/dL    RDW 16.0 (H) 11.6 - 14.8 %    PLATELETS 289 140 - 440 x10^3/uL    MPV 7.8 7.4 - 10.4 fL    NEUTROPHIL % 61 40 - 76 %  LYMPHOCYTE % 30 25 - 45 %    MONOCYTE % 7 0 - 12 %    EOSINOPHIL % 2 0 - 7 %    BASOPHIL % 0 0 - 3 %    NEUTROPHIL # 6.38 4.10 - 29.00 x10^3/uL    LYMPHOCYTE # 3.15 2.10 - 11.00 x10^3/uL    MONOCYTE # 0.71 0.00 - 4.10 x10^3/uL    EOSINOPHIL # 0.23 0.00 - 2.40 x10^3/uL    BASOPHIL # 0.05 0.00 - 2.50 x10^3/uL   URINALYSIS, MACRO/MICRO   Result Value Ref Range    COLOR Yellow Yellow, Colorless, Light Yellow, Dark Yellow    APPEARANCE Clear Clear    SPECIFIC GRAVITY 1.010 1.003 - 1.035    PH 6.0 4.6 - 8.0    LEUKOCYTES Negative Negative WBCs/uL    NITRITE Negative Negative    PROTEIN Negative Negative mg/dL    GLUCOSE >=1000 (A) Negative mg/dL    KETONES 40 (A) Negative mg/dL    BILIRUBIN Negative Negative mg/dL    BLOOD Negative Negative mg/dL    UROBILINOGEN 0.2 0.2 - 1.0 mg/dL        Radiology:  CT ABDOMEN PELVIS WO IV CONTRAST  CT abdomen negative for any pathology  CT  ABDOMEN PELVIS WO IV CONTRAST    (Results Pending)       ECG:  NONE            Differential diagnosis    Renal colic constipation abdominal pain small-bowel obstruction  Course/Disposition/Plan     Course:  CT negative for any significant pathology patient discharged home        Disposition:    Discharged    Condition at Disposition:    Stable    Follow up:   Peri Maris, DO  [place] [id number]  [phone]    Schedule an appointment as soon as possible for a visit in 3 days  If symptoms worsen      Clinical Impression:     Clinical Impression   Left lower quadrant abdominal pain (Primary)         Winfred Burn, MD

## 2022-01-26 ENCOUNTER — Other Ambulatory Visit: Payer: Self-pay

## 2022-01-26 ENCOUNTER — Emergency Department
Admission: EM | Admit: 2022-01-26 | Discharge: 2022-01-27 | Disposition: A | Discharge status: Home / Self Care | Payer: Medicaid Other | Attending: Emergency Medicine | Admitting: Emergency Medicine

## 2022-01-26 ENCOUNTER — Encounter (HOSPITAL_BASED_OUTPATIENT_CLINIC_OR_DEPARTMENT_OTHER): Payer: Self-pay

## 2022-01-26 DIAGNOSIS — J029 Acute pharyngitis, unspecified: Secondary | ICD-10-CM | POA: Insufficient documentation

## 2022-01-26 DIAGNOSIS — J028 Acute pharyngitis due to other specified organisms: Secondary | ICD-10-CM

## 2022-01-26 DIAGNOSIS — F17210 Nicotine dependence, cigarettes, uncomplicated: Secondary | ICD-10-CM | POA: Insufficient documentation

## 2022-01-26 DIAGNOSIS — H6692 Otitis media, unspecified, left ear: Secondary | ICD-10-CM | POA: Insufficient documentation

## 2022-01-26 LAB — RAPID THROAT SCREEN, STREPTOCOCCUS, WITH REFLEX: THROAT RAPID SCREEN, STREPTOCOCCUS: NEGATIVE

## 2022-01-26 MED ORDER — LIDOCAINE HCL 10 MG/ML (1 %) INJECTION SOLUTION
INTRAMUSCULAR | Status: AC
Start: 2022-01-26 — End: 2022-01-26
  Filled 2022-01-26: qty 20

## 2022-01-26 MED ORDER — CEFTRIAXONE 1 GRAM SOLUTION FOR INJECTION
INTRAMUSCULAR | Status: AC
Start: 2022-01-26 — End: 2022-01-26
  Filled 2022-01-26: qty 10

## 2022-01-26 MED ORDER — LIDOCAINE HCL 10 MG/ML (1 %) INJECTION SOLUTION
1.0000 g | Freq: Once | INTRAMUSCULAR | Status: AC
Start: 2022-01-27 — End: 2022-01-26
  Administered 2022-01-26: 1 g via INTRAMUSCULAR

## 2022-01-26 MED ORDER — LORATADINE 10 MG TABLET
10.0000 mg | ORAL_TABLET | Freq: Every day | ORAL | 0 refills | Status: AC
Start: 2022-01-26 — End: 2022-02-05

## 2022-01-26 MED ORDER — CEFUROXIME AXETIL 250 MG TABLET
250.0000 mg | ORAL_TABLET | Freq: Two times a day (BID) | ORAL | 0 refills | Status: DC
Start: 2022-01-26 — End: 2022-02-13

## 2022-01-26 MED ORDER — DEXAMETHASONE SODIUM PHOSPHATE 4 MG/ML INJECTION SOLUTION
4.0000 mg | INTRAMUSCULAR | Status: AC
Start: 2022-01-27 — End: 2022-01-26
  Administered 2022-01-26: 4 mg via INTRAMUSCULAR

## 2022-01-26 MED ORDER — DEXAMETHASONE SODIUM PHOSPHATE 4 MG/ML INJECTION SOLUTION
INTRAMUSCULAR | Status: AC
Start: 2022-01-26 — End: 2022-01-26
  Filled 2022-01-26: qty 1

## 2022-01-26 MED ORDER — LORATADINE 10 MG TABLET
10.0000 mg | ORAL_TABLET | Freq: Every day | ORAL | Status: DC
Start: 2022-01-27 — End: 2022-01-27

## 2022-01-26 NOTE — ED Triage Notes (Signed)
Complains of sore throat that is itchy and hurts, also complains of left ear pain.

## 2022-01-26 NOTE — ED Attending Note (Signed)
[facility] emergency department         HISTORY OF PRESENT ILLNESS     Date:  01/26/2022  Patient's Name:  Gail Morris  Date of Birth:  1966/07/30    Patient is a 56 year old presenting with a 2 day history of scratchy throat difficulty swallowing and left ear pain.  History of multiple infections in the past.  Denies COVID exposure.  Denies strep exposure.          Review of Systems     Review of Systems   Constitutional: Negative.    HENT: Positive for congestion, ear pain, sinus pain and sore throat.    Respiratory: Negative.    Cardiovascular: Negative.    Gastrointestinal: Negative.    Endocrine: Negative.    Genitourinary: Negative.    All other systems reviewed and are negative.      Previous History     Past Medical History:  Past Medical History:   Diagnosis Date   . Breast cancer (CMS [place])    . Cancer (CMS [place])    . Diabetes mellitus, type 2 (CMS HCC)    . HTN (hypertension)        Past Surgical History:  Past Surgical History:   Procedure Laterality Date   . Hx cholecystectomy     . Simple mastectomy Bilateral        Social History:  Social History     Tobacco Use   . Smoking status: Every Day     Packs/day: 1.00     Types: Cigarettes   Substance Use Topics   . Alcohol use: Never   . Drug use: Never     Social History     Substance and Sexual Activity   Drug Use Never       Family History:  No family history on file.    Medication History:  Current Outpatient Medications   Medication Sig   . cefuroxime (CEFTIN) 250 mg Oral Tablet Take 1 Tablet (250 mg total) by mouth Twice daily for 7 days   . empagliflozin (JARDIANCE) 25 mg Oral Tablet Take 1 Tablet (25 mg total) by mouth Once a day   . gabapentin (NEURONTIN) 400 mg Oral Capsule Take 2 Capsules (800 mg total) by mouth Three times a day   . insulin glargine 100 unit/mL Subcutaneous injection (vial) Inject under the skin Every night   . loratadine (CLARITIN) 10 mg Oral Tablet Take 1 Tablet (10 mg total) by mouth  Once a day for 10 days   . ramipriL (ALTACE) 10 mg Oral Capsule Take 1 Capsule (10 mg total) by mouth Once a day       Allergies:  Allergies   Allergen Reactions   . Metformin    . Tramadol    . Trulicity [Dulaglutide]        Physical Exam     Vitals:    There were no vitals taken for this visit.          Physical Exam  Vitals and nursing note reviewed.   Constitutional:       General: She is not in acute distress.     Appearance: She is well-developed.   HENT:      Head: Normocephalic and atraumatic.      Ears:      Comments: Left TM hyperemic with scar  Eyes:      Conjunctiva/sclera: Conjunctivae normal.   Cardiovascular:      Rate  and Rhythm: Normal rate and regular rhythm.      Heart sounds: No murmur heard.  Pulmonary:      Effort: Pulmonary effort is normal. No respiratory distress.      Breath sounds: Normal breath sounds.   Abdominal:      Palpations: Abdomen is soft.      Tenderness: There is no abdominal tenderness.   Musculoskeletal:         General: No swelling.      Cervical back: Neck supple.   Skin:     General: Skin is warm and dry.      Capillary Refill: Capillary refill takes less than 2 seconds.   Neurological:      Mental Status: She is alert.   Psychiatric:         Mood and Affect: Mood normal.         Diagnostic Studies/Treatment     Medications:  Medications Administered in the ED   cefTRIAXone (ROCEPHIN) 1 g in lidocaine 2.86 mL (tot vol) IM injection (has no administration in time range)   dexamethasone 4 mg/mL injection (has no administration in time range)   loratadine (CLARITIN) tablet (has no administration in time range)       New Prescriptions    CEFUROXIME (CEFTIN) 250 MG ORAL TABLET    Take 1 Tablet (250 mg total) by mouth Twice daily for 7 days    LORATADINE (CLARITIN) 10 MG ORAL TABLET    Take 1 Tablet (10 mg total) by mouth Once a day for 10 days       Labs:    Results for orders placed or performed during the hospital encounter of 01/26/22 (from the past 12 hour(s))   RAPID  THROAT SCREEN, STREPTOCOCCUS, WITH REFLEX    Specimen: Throat; Swab   Result Value Ref Range    THROAT RAPID SCREEN, STREPTOCOCCUS Negative Negative        Radiology:  None    No orders to display       ECG:  NONE            Differential diagnosis    For strep pharyngitis, upper respiratory infection, otitis media  Course/Disposition/Plan     Course:      Treated with IM Rocephin and Decadron discharged home  Disposition:    Discharged    Condition at Disposition:      Stable  Follow up:   Peri Maris, DO  [place] [address]  [phone]    Schedule an appointment as soon as possible for a visit   If symptoms worsen      Clinical Impression:     Clinical Impression   Left otitis media, unspecified otitis media type (Primary)   Pharyngitis due to other organism         Winfred Burn, MD

## 2022-01-30 LAB — THROAT CULTURE, BETA HEMOLYTIC STREPTOCOCCUS: THROAT CULTURE: NORMAL

## 2022-02-07 ENCOUNTER — Encounter (INDEPENDENT_AMBULATORY_CARE_PROVIDER_SITE_OTHER): Payer: Self-pay | Admitting: OTOLARYNGOLOGY

## 2022-02-12 ENCOUNTER — Encounter (INDEPENDENT_AMBULATORY_CARE_PROVIDER_SITE_OTHER): Payer: Self-pay | Admitting: OTOLARYNGOLOGY

## 2022-02-13 ENCOUNTER — Emergency Department (HOSPITAL_COMMUNITY): Payer: MEDICAID

## 2022-02-13 ENCOUNTER — Emergency Department
Admission: EM | Admit: 2022-02-13 | Discharge: 2022-02-13 | Disposition: A | Discharge status: Home / Self Care | Payer: MEDICAID | Attending: Emergency Medicine | Admitting: Emergency Medicine

## 2022-02-13 ENCOUNTER — Other Ambulatory Visit: Payer: Self-pay

## 2022-02-13 DIAGNOSIS — S52202A Unspecified fracture of shaft of left ulna, initial encounter for closed fracture: Secondary | ICD-10-CM

## 2022-02-13 DIAGNOSIS — W19XXXA Unspecified fall, initial encounter: Secondary | ICD-10-CM | POA: Insufficient documentation

## 2022-02-13 DIAGNOSIS — S52692A Other fracture of lower end of left ulna, initial encounter for closed fracture: Secondary | ICD-10-CM | POA: Insufficient documentation

## 2022-02-13 MED ORDER — HYDROCODONE 5 MG-ACETAMINOPHEN 325 MG TABLET
ORAL_TABLET | ORAL | Status: AC
Start: 2022-02-13 — End: 2022-02-13
  Filled 2022-02-13: qty 2

## 2022-02-13 MED ORDER — HYDROCODONE 7.5 MG-ACETAMINOPHEN 325 MG TABLET
1.0000 | ORAL_TABLET | Freq: Four times a day (QID) | ORAL | 0 refills | Status: DC | PRN
Start: 2022-02-13 — End: 2022-03-14

## 2022-02-13 MED ORDER — HYDROCODONE 5 MG-ACETAMINOPHEN 325 MG TABLET
2.0000 | ORAL_TABLET | ORAL | Status: DC
Start: 2022-02-13 — End: 2022-02-13
  Administered 2022-02-13: 0 via ORAL

## 2022-02-13 NOTE — ED Nurses Note (Signed)
Pt d/c home at this time. IV removed, intact, pressure dressing applied. Pt verbalized understanding of d/c instructions. Pt ambulated out of department.

## 2022-02-13 NOTE — ED Triage Notes (Signed)
Trip on concrete left arm pain , rtr leg . Hit side face with tenderness no loc acc [phone]

## 2022-02-13 NOTE — Discharge Instructions (Addendum)
Norco every 6 hours as needed for severe pain  Continue all your normal medications at home as prescribed by your doctor  Keep the temporary cast on until seen by an orthopedic surgeon.  See an orthopedic surgeon from the list provided below within 1 week for a more definitive cast.  Dr. Murlean Hark is on-call tonight.  Return to the ER for any emergencies you may have.    [facility] -- Dr. Marcelino Scot, Dr. Carin Primrose and Dr. Nicole Cella  [phone]  [place], [place] [address]    Mervyn Skeeters  [address] [place], [place] [address]  Phone: [phone]

## 2022-02-14 NOTE — ED Provider Notes (Signed)
[facility]  ED Primary Provider Note  Patient Name: Gail Morris  Patient Age: 56 y.o.  Date of Birth: Sep 14, 1966    Chief Complaint: Fall        History of Present Illness       Gail Morris is a 56 y.o. female who had concerns including Fall.     HPI   Chief Complaint: Fall    HPI:   Symptoms: Patient has left wrist/forearm pain.  She also states she has bilateral knee pain but it appears that she is scraped her knees.  Patient states she did not hit her head.  She initially stated that she did not have any neck pain but when I palpated it she complained of pain.  She denies any abdominal pain.  She denies any nausea vomit or diarrhea.  There is no numbness or tingling  Location: Left wrist  Radiation: Left forearm  Severity: Moderate  Quality: Aching  Duration: Constant  Onset: Acute  Timing: Immediately prior to arrival  Context: Patient arrived by EMS  Mechanism: Patient was stepping up onto the sidewalk when her toe caught and she fell forward landing on outstretched hand  Additional Symptoms: Patient denies any new back pain.  She can ambulate but chooses not to due to pain in her wrist.  There is no broken skin on her wrist.  She does have abrasions to her knees.  She denies any confusion or altered level of consciousness.  Additional Information: Patient states that her tetanus shot is up-to-date    Social History  Smoking: Positive  Alcohol: None  [employer] Drugs: None    Past Medical History:Reviewed and listed in the chart below.  Hypertension, breast cancer, diabetes    Past Surgical History: Reviewed and listed on the chart below.  Mastectomy, cholecystectomy    Family History: Noncontributory    Allergies: Reviewed and listed on the chart    Medications: Reviewed and listed on the chart.    The nursing notes were reviewed and agreed upon unless otherwise stated    The vital signs were reviewed and are listed on the chart          Review of Systems       ROS: No other  overt Review of Systems are noted to be positive except noted in the HPI.        Physical Exam   ED Triage Vitals [02/13/22 1432]   BP (Non-Invasive) (!) 174/90   Heart Rate (!) 101   Respiratory Rate 18   Temperature 36.3 C (97.4 F)   SpO2 96 %   Weight 50.8 kg (112 lb)   Height 1.585 m (5' 2.4")         Physical Exam   Physical exam  Constitutional/general:     56 year old female who looks older than her stated age.  She is in no acute distress.  She appears to be in mild to moderate pain.  HEENT: Eyes show normal extraocular movements.  Well-hydrated oral mucosa is noted.  There is no facial trauma or abnormalities.  Neck: Patient has midline tenderness to palpation on C5/C6 and C6 on 7.  I kept her neck immobilizer until got the lateral view of the neck and radiology suite.  It was then removed.  After it was removed, the patient had full range of motion.  No JVD.  Cardiovascular: Heart is regular rate and rhythm S1-S2 sounds were auscultated without murmur click or rub  Respiratory: Lungs are clear to auscultation in all fields without wheeze rale or rhonchi.  GI: Abdomen is soft non tender normal bowel sounds are auscultated.  There is no rebound tenderness or guarding  Neuro cranial nerves II through XII are intact and normal.  Patient has normal speech and normal gait.  There is no muscle weakness in any extremities.  Sensation is intact throughout.  Psych: [Patient is alert and oriented person place and time.  Patient is very pleasant converse with has a euthymic affect.  Patient appears to be depressed   skin: Patient has abrasion of the bilateral knees  Musculoskeletal: There is tenderness palpation to bilateral knees but peers to be mainly related to her skin.  She does have tenderness to palpation to the ulnar surface of the left forearm.  The maximum pain is at the left wrist ulnar area.  She does have some tenderness palpation in the proximal ulna but it is mainly related to pain in the wrist.   There is no other tenderness palpation in any other body region.  GU: Deferred    Splint/Cast    Date/Time: 02/14/2022 11:52 AM  Performed by: Darryll Capers, DO  Authorized by: Darryll Capers, DO     Consent:     Consent obtained:  Verbal    Consent given by:  Patient    Risks, benefits, and alternatives were discussed: yes      Alternatives discussed:  No treatment and delayed treatment  Universal protocol:     Patient identity confirmed:  Verbally with patient  Pre-procedure details:     Distal neurologic exam:  Normal  Procedure details:     Location:  Wrist    Wrist location:  L wrist    Splint type:  Ulnar gutter    Supplies:  Fiberglass  Post-procedure details:     Distal neurologic exam:  Normal    Distal perfusion: brisk capillary refill      Procedure completion:  Tolerated well, no immediate complications    Post-procedure imaging: not applicable    Comments:      Pro cast was placed by the ER tech and verified by me for placement.           Patient Data   Labs Ordered/Reviewed - No data to display    XR CERVICAL SPINE COMPLETE (4 OR MORE VIEWS)   Final Result by Edi, Radresults In (05/03 1630)   NO ACUTE FINDINGS. IF THE PATIENT IS 46 YEARS OF AGE OR OLDER AND CERVICAL SPINE IMAGING IS INDICATED BY CLINICAL CRITERIA, HOWEVER, THEN CT SHOULD ALSO BE PERFORMED.               Radiologist location ID: [id number]         XR WRIST LEFT   Final Result by Edi, Radresults In (05/03 1633)   Oblique nondisplaced fracture of the distal ulna                Radiologist location ID: [place] [id number]          Medical Decision Making  Fall, initial encounter: acute illness or injury  Left ulnar fracture: acute illness or injury  Amount and/or Complexity of Data Reviewed  Radiology: ordered. Decision-making details documented in ED Course.      Risk  Prescription drug management.  Parenteral controlled substances.  Clinical Impression   Left ulnar fracture  (Primary)   Fall, initial encounter         Discharge Medication List as of 02/13/2022  6:54 PM      START taking these medications    Details   HYDROcodone-acetaminophen (NORCO) 7.5-325 mg Oral Tablet Take 1 Tablet by mouth Every 6 hours as needed for Pain for up to 10 doses, Disp-10 Tablet, R-0, Print             Discharged    Norco every 6 hours as needed for severe pain  Continue all your normal medications at home as prescribed by your doctor  Keep the temporary cast on until seen by an orthopedic surgeon.  See an orthopedic surgeon from the list provided below within 1 week for a more definitive cast.  Dr. Murlean Hark is on-call tonight.  Return to the ER for any emergencies you may have.    [facility] -- Dr. Marcelino Scot, Dr. Carin Primrose and Dr. Nicole Cella  [phone]  [place], [place] [address]    Mervyn Skeeters  [address], [place], [place] [address]  Phone: [phone]      _______________________________  Darryll Capers, D.O.  Emergency Medicine  [facility]

## 2022-02-20 ENCOUNTER — Other Ambulatory Visit: Payer: Self-pay

## 2022-02-20 ENCOUNTER — Inpatient Hospital Stay
Admission: RE | Admit: 2022-02-20 | Discharge: 2022-02-20 | Disposition: A | Discharge status: Home / Self Care | Payer: MEDICAID | Source: Ambulatory Visit | Attending: Family Medicine | Admitting: Family Medicine

## 2022-02-20 ENCOUNTER — Other Ambulatory Visit (HOSPITAL_COMMUNITY): Payer: Self-pay | Admitting: Family Medicine

## 2022-02-20 DIAGNOSIS — M25552 Pain in left hip: Secondary | ICD-10-CM

## 2022-03-14 ENCOUNTER — Emergency Department
Admission: EM | Admit: 2022-03-14 | Discharge: 2022-03-14 | Disposition: A | Discharge status: Home / Self Care | Payer: MEDICAID | Attending: Emergency Medicine | Admitting: Emergency Medicine

## 2022-03-14 ENCOUNTER — Other Ambulatory Visit: Payer: Self-pay

## 2022-03-14 ENCOUNTER — Encounter (HOSPITAL_BASED_OUTPATIENT_CLINIC_OR_DEPARTMENT_OTHER): Payer: Self-pay

## 2022-03-14 DIAGNOSIS — S50862A Insect bite (nonvenomous) of left forearm, initial encounter: Secondary | ICD-10-CM | POA: Insufficient documentation

## 2022-03-14 DIAGNOSIS — S70361A Insect bite (nonvenomous), right thigh, initial encounter: Secondary | ICD-10-CM | POA: Insufficient documentation

## 2022-03-14 DIAGNOSIS — S40861A Insect bite (nonvenomous) of right upper arm, initial encounter: Secondary | ICD-10-CM | POA: Insufficient documentation

## 2022-03-14 DIAGNOSIS — L989 Disorder of the skin and subcutaneous tissue, unspecified: Secondary | ICD-10-CM

## 2022-03-14 DIAGNOSIS — Z720 Tobacco use: Secondary | ICD-10-CM | POA: Insufficient documentation

## 2022-03-14 DIAGNOSIS — W57XXXA Bitten or stung by nonvenomous insect and other nonvenomous arthropods, initial encounter: Secondary | ICD-10-CM | POA: Insufficient documentation

## 2022-03-14 DIAGNOSIS — S90861A Insect bite (nonvenomous), right foot, initial encounter: Secondary | ICD-10-CM | POA: Insufficient documentation

## 2022-03-14 NOTE — Discharge Instructions (Signed)
I your seen emergency department for spots under skin which are consistent with insect bites.  Use hydrocortisone to help deal with your itchiness and redness.  It is important that you clean your your home in her clothes thoroughly.  Follow up primary care ideally within the next 3-5 days as needed.  Return to emergency department sooner should he have worsening redness uncontrollable fevers chills or any other concerning symptoms.  Thank you for visiting [facility]

## 2022-03-14 NOTE — ED Provider Notes (Signed)
[facility], [facility] Emergency Department  ED Primary Provider Note  HPI:  Gail Morris is a 56 y.o. female     Patient complains of bug bites has been occurring over the past week or so.  She notices lesions on her arms legs her thigh.  No fevers no chills though not particularly painful but they are itchy.  She is use topical lidocaine with little improvement in symptoms.  Patient is status post bilateral mastectomy and also concerned about her blood pressure.  She states she is been compliant with her medications.  Active smoker.  No other medical problems.  Not COVID vaccinated.  Full code.    ROS review and negative aside from stated in HPI.    Physical Exam:  ED Triage Vitals [03/14/22 2208]   BP (Non-Invasive) (!) 215/121   Heart Rate (!) 101   Respiratory Rate 19   Temperature 37.4 C (99.4 F)   SpO2 97 %   Weight 50.8 kg (112 lb)   Height 1.575 m ('5\' 2"'$ )     No acute distress.  Patient awake alert oriented x3.  Mood is appropriate.  Pupils 3 mm equal round reactive.  Extraocular movements are intact.  Oropharynx is clear.  Mucous membranes moist.  Trachea midline.  Neck is supple.  Heart has regular rate and rhythm without significant murmurs rubs or gallops.  Lungs are clear to auscultation.  Abdomen soft nontender, nondistended.  Moving all extremities without difficulty.  Patient has proximally 1 cm diameter sporadic lesions over her right upper arm her right thigh lateral aspect of right foot and left forearm.  There is no mucosal involvement.  There is no overlying redness or warmth.  No tenderness.  No fluctuance    Patient data:  Labs Ordered/Reviewed - No data to display  No orders to display       MDM:    Patient has lesions which are consistent with insect bites.  This is non herpetic there is no overlying cellulitis.  I did review my assessment with the patient and attempted answered her questions.  Recommended hydrocortisone cream.  I gave strict return to ED  instructions in both a verbal written manner.  Patient comfortable with discharge.     ED Course as of 03/14/22 2236   Thu Mar 14, 2022   2224 BP (Non-Invasive)(!): 153/91      Discharged  Clinical Impression   Insect bite (Primary)           Current Discharge Medication List      CONTINUE these medications - NO CHANGES were made during your visit.      Details   Cholecalciferol (Vitamin D3) 50 mcg (2,000 unit) Capsule   2,000 Units, Oral  Refills: 0     gabapentin 400 mg Capsule  Commonly known as: NEURONTIN   900 mg, Oral, 3 TIMES DAILY  Refills: 0     LORazepam 0.5 mg Tablet  Commonly known as: ATIVAN   Lorazepam Oral        active  Refills: 0     ramipriL 10 mg Capsule  Commonly known as: ALTACE   10 mg, Oral, DAILY  Refills: 0     sAXagliptin 5 mg Tablet  Commonly known as: ONGLYZA   Saxagliptin Oral        active  Refills: 0     tizanidine 2 mg Capsule  Commonly known as: ZANAFLEX   Tizanidine Oral  active  Refills: 0     venlafaxine 75 mg Capsule, Sust. Release 24 hr  Commonly known as: EFFEXOR XR   Venlafaxine Oral 24 hr Cap        active  Refills: 0

## 2022-03-14 NOTE — ED Nurses Note (Signed)
Patient discharged home with family.  AVS reviewed with patient/care giver.  A written copy of the AVS and discharge instructions was given to the patient/care giver.  Questions sufficiently answered as needed.  Patient/care giver encouraged to follow up with PCP as indicated.  In the event of an emergency, patient/care giver instructed to call 911 or go to the nearest emergency room.

## 2022-03-14 NOTE — ED Triage Notes (Signed)
Pt c/o of insect bites on her right forearm. Inner right upper arm, shoulder, side, and on her feet. Pt states that she's concerned about getting an infection from the bites due to having type two diabetes. Just wants to be checked and make sure she's "ok".

## 2022-04-03 ENCOUNTER — Other Ambulatory Visit: Payer: Self-pay

## 2022-04-03 ENCOUNTER — Other Ambulatory Visit (HOSPITAL_COMMUNITY): Payer: MEDICAID

## 2022-04-03 ENCOUNTER — Other Ambulatory Visit (HOSPITAL_COMMUNITY): Payer: Self-pay | Admitting: Podiatrist

## 2022-04-03 ENCOUNTER — Inpatient Hospital Stay
Admission: RE | Admit: 2022-04-03 | Discharge: 2022-04-03 | Disposition: A | Discharge status: Home / Self Care | Payer: MEDICAID | Source: Ambulatory Visit | Attending: Podiatrist | Admitting: Podiatrist

## 2022-04-03 DIAGNOSIS — Z01818 Encounter for other preprocedural examination: Secondary | ICD-10-CM

## 2022-04-03 DIAGNOSIS — E119 Type 2 diabetes mellitus without complications: Secondary | ICD-10-CM

## 2022-04-03 LAB — URINALYSIS, MACROSCOPIC
BILIRUBIN: NEGATIVE mg/dL
BLOOD: NEGATIVE mg/dL
GLUCOSE: >1000 mg/dL — AB
KETONES: NEGATIVE mg/dL
LEUKOCYTES: NEGATIVE WBCs/uL
NITRITE: NEGATIVE
PH: 6.5 (ref 5.0–9.0)
PROTEIN: NEGATIVE mg/dL
SPECIFIC GRAVITY: 1.036 — ABNORMAL HIGH (ref 1.002–1.030)
UROBILINOGEN: NORMAL mg/dL

## 2022-04-03 LAB — CBC WITH DIFF
BASOPHIL #: 0.1 10*3/uL (ref 0.00–0.30)
BASOPHIL %: 1 % (ref 0–3)
EOSINOPHIL #: 0.2 10*3/uL (ref 0.00–0.80)
EOSINOPHIL %: 2 % (ref 0–7)
HCT: 43.3 % (ref 37.0–47.0)
HGB: 14.8 g/dL (ref 12.5–16.0)
LYMPHOCYTE #: 3.4 10*3/uL (ref 1.10–5.00)
LYMPHOCYTE %: 34 % (ref 25–45)
MCH: 31 pg (ref 27.0–32.0)
MCHC: 34.3 g/dL (ref 32.0–36.0)
MCV: 90.3 fL (ref 78.0–99.0)
MONOCYTE #: 0.7 10*3/uL (ref 0.00–1.30)
MONOCYTE %: 7 % (ref 0–12)
MPV: 7.7 fL (ref 7.4–10.4)
NEUTROPHIL #: 5.5 10*3/uL (ref 1.80–8.40)
NEUTROPHIL %: 56 % (ref 40–76)
PLATELETS: 263 10*3/uL (ref 140–440)
RBC: 4.8 10*6/uL (ref 4.20–5.40)
RDW: 13.7 % (ref 11.6–14.8)
WBC: 9.9 10*3/uL (ref 4.0–10.5)
WBCS UNCORRECTED: 9.9 10*3/uL

## 2022-04-03 LAB — BASIC METABOLIC PANEL
ANION GAP: 8 mmol/L — ABNORMAL LOW (ref 10–20)
BUN/CREA RATIO: 32 — ABNORMAL HIGH (ref 6–22)
BUN: 13 mg/dL (ref 7–25)
CALCIUM: 9.4 mg/dL (ref 8.6–10.3)
CHLORIDE: 106 mmol/L (ref 98–107)
CO2 TOTAL: 24 mmol/L (ref 21–31)
CREATININE: 0.41 mg/dL — ABNORMAL LOW (ref 0.60–1.30)
ESTIMATED GFR: 117 mL/min/{1.73_m2} (ref >59)
GLUCOSE: 178 mg/dL — ABNORMAL HIGH (ref 74–109)
OSMOLALITY, CALCULATED: 280 mOsm/kg (ref 270–290)
POTASSIUM: 3.7 mmol/L (ref 3.5–5.1)
SODIUM: 138 mmol/L (ref 136–145)

## 2022-04-03 LAB — URINALYSIS, MICROSCOPIC
BACTERIA: NEGATIVE /hpf
RBCS: 1 /hpf (ref <4)
SQUAMOUS EPITHELIAL: 6 /hpf (ref <28)
WBCS: 4 /hpf (ref <6)

## 2022-04-03 LAB — HGA1C (HEMOGLOBIN A1C WITH EST AVG GLUCOSE): HEMOGLOBIN A1C: 9.3 % — ABNORMAL HIGH (ref 4.0–6.0)

## 2022-04-04 LAB — ECG 12 LEAD
Atrial Rate: 67 {beats}/min
Calculated P Axis: 73 degrees
Calculated R Axis: 67 degrees
Calculated T Axis: 67 degrees
PR Interval: 172 ms
QRS Duration: 92 ms
QT Interval: 438 ms
QTC Calculation: 462 ms
Ventricular rate: 67 {beats}/min

## 2022-04-08 ENCOUNTER — Inpatient Hospital Stay
Admission: RE | Admit: 2022-04-08 | Discharge: 2022-04-08 | Disposition: A | Discharge status: Home / Self Care | Payer: MEDICAID | Source: Ambulatory Visit | Attending: Podiatrist | Admitting: Podiatrist

## 2022-04-08 ENCOUNTER — Ambulatory Visit (HOSPITAL_COMMUNITY): Payer: MEDICAID | Admitting: Anesthesiology

## 2022-04-08 ENCOUNTER — Other Ambulatory Visit: Payer: Self-pay

## 2022-04-08 ENCOUNTER — Encounter (HOSPITAL_COMMUNITY)
Admission: RE | Disposition: A | Discharge status: Home / Self Care | Payer: Self-pay | Source: Ambulatory Visit | Attending: Podiatrist

## 2022-04-08 ENCOUNTER — Encounter (HOSPITAL_COMMUNITY): Payer: Self-pay | Admitting: Podiatrist

## 2022-04-08 DIAGNOSIS — C44729 Squamous cell carcinoma of skin of left lower limb, including hip: Secondary | ICD-10-CM | POA: Insufficient documentation

## 2022-04-08 HISTORY — DX: Migraine, unspecified, not intractable, without status migrainosus: G43.909

## 2022-04-08 HISTORY — DX: Gastro-esophageal reflux disease without esophagitis: K21.9

## 2022-04-08 HISTORY — DX: Anxiety disorder, unspecified: F41.9

## 2022-04-08 HISTORY — DX: Polyneuropathy, unspecified: G62.9

## 2022-04-08 HISTORY — DX: Primary generalized (osteo)arthritis: M15.0

## 2022-04-08 HISTORY — DX: Depression, unspecified: F32.A

## 2022-04-08 HISTORY — DX: Hiccough: R06.6

## 2022-04-08 HISTORY — DX: Personal history of other venous thrombosis and embolism: Z86.718

## 2022-04-08 LAB — POC BLOOD GLUCOSE (RESULTS): GLUCOSE, POC: 129 mg/dl (ref 50–500)

## 2022-04-08 SURGERY — AMPUTATION TOE
Anesthesia: Monitor Anesthesia Care | Site: Foot | Laterality: Left | Wound class: Clean Wound: Uninfected operative wounds in which no inflammation occurred

## 2022-04-08 MED ORDER — FAMOTIDINE (PF) 20 MG/2 ML INTRAVENOUS SOLUTION
INTRAVENOUS | Status: AC
Start: 2022-04-08 — End: 2022-04-08
  Filled 2022-04-08: qty 2

## 2022-04-08 MED ORDER — CEFAZOLIN 1 GRAM SOLUTION FOR INJECTION
INTRAMUSCULAR | Status: AC
Start: 2022-04-08 — End: 2022-04-08
  Filled 2022-04-08: qty 20

## 2022-04-08 MED ORDER — FENTANYL (PF) 50 MCG/ML INJECTION SOLUTION
INTRAMUSCULAR | Status: AC
Start: 2022-04-08 — End: 2022-04-08
  Filled 2022-04-08: qty 2

## 2022-04-08 MED ORDER — ONDANSETRON HCL (PF) 4 MG/2 ML INJECTION SOLUTION
INTRAMUSCULAR | Status: AC
Start: 2022-04-08 — End: 2022-04-08
  Filled 2022-04-08: qty 2

## 2022-04-08 MED ORDER — LIDOCAINE HCL 10 MG/ML (1 %) INJECTION SOLUTION
INTRAMUSCULAR | Status: AC
Start: 2022-04-08 — End: 2022-04-08
  Filled 2022-04-08: qty 20

## 2022-04-08 MED ORDER — ONDANSETRON HCL (PF) 4 MG/2 ML INJECTION SOLUTION
4.0000 mg | Freq: Once | INTRAMUSCULAR | Status: AC
Start: 2022-04-08 — End: 2022-04-08
  Administered 2022-04-08: 4 mg via INTRAVENOUS

## 2022-04-08 MED ORDER — MIDAZOLAM 5 MG/ML INJECTION WRAPPER
Freq: Once | INTRAMUSCULAR | Status: DC | PRN
Start: 2022-04-08 — End: 2022-04-08
  Administered 2022-04-08: 2 mg via INTRAVENOUS
  Administered 2022-04-08: 3 mg via INTRAVENOUS

## 2022-04-08 MED ORDER — ONDANSETRON HCL (PF) 4 MG/2 ML INJECTION SOLUTION
4.0000 mg | Freq: Once | INTRAMUSCULAR | Status: DC | PRN
Start: 2022-04-08 — End: 2022-04-08

## 2022-04-08 MED ORDER — MIDAZOLAM 5 MG/ML INJECTION WRAPPER
INTRAMUSCULAR | Status: AC
Start: 2022-04-08 — End: 2022-04-08
  Filled 2022-04-08: qty 1

## 2022-04-08 MED ORDER — SODIUM CHLORIDE 0.9 % (FLUSH) INJECTION SYRINGE
3.0000 mL | INJECTION | INTRAMUSCULAR | Status: DC | PRN
Start: 2022-04-08 — End: 2022-04-08

## 2022-04-08 MED ORDER — FENTANYL (PF) 50 MCG/ML INJECTION WRAPPER
50.0000 ug | INJECTION | INTRAMUSCULAR | Status: DC | PRN
Start: 2022-04-08 — End: 2022-04-08

## 2022-04-08 MED ORDER — IPRATROPIUM 0.5 MG-ALBUTEROL 3 MG (2.5 MG BASE)/3 ML NEBULIZATION SOLN
3.0000 mL | INHALATION_SOLUTION | Freq: Once | RESPIRATORY_TRACT | Status: DC | PRN
Start: 2022-04-08 — End: 2022-04-08

## 2022-04-08 MED ORDER — LIDOCAINE HCL 10 MG/ML (1 %) INJECTION SOLUTION
Freq: Once | INTRAMUSCULAR | Status: DC | PRN
Start: 2022-04-08 — End: 2022-04-08
  Administered 2022-04-08: 10 mL via INTRAMUSCULAR

## 2022-04-08 MED ORDER — ALBUTEROL SULFATE 2.5 MG/3 ML (0.083 %) SOLUTION FOR NEBULIZATION
2.5000 mg | INHALATION_SOLUTION | Freq: Once | RESPIRATORY_TRACT | Status: DC | PRN
Start: 2022-04-08 — End: 2022-04-08

## 2022-04-08 MED ORDER — SODIUM CHLORIDE 0.9 % (FLUSH) INJECTION SYRINGE
3.0000 mL | INJECTION | Freq: Three times a day (TID) | INTRAMUSCULAR | Status: DC
Start: 2022-04-08 — End: 2022-04-08

## 2022-04-08 MED ORDER — EPHEDRINE SULFATE 50 MG/ML INTRAVENOUS SOLUTION
Freq: Once | INTRAVENOUS | Status: DC | PRN
Start: 2022-04-08 — End: 2022-04-08
  Administered 2022-04-08 (×2): 10 mg via INTRAVENOUS

## 2022-04-08 MED ORDER — LACTATED RINGERS INTRAVENOUS SOLUTION
INTRAVENOUS | Status: DC
Start: 2022-04-08 — End: 2022-04-08

## 2022-04-08 MED ORDER — OXYCODONE-ACETAMINOPHEN 5 MG-325 MG TABLET
1.0000 | ORAL_TABLET | ORAL | Status: DC | PRN
Start: 2022-04-08 — End: 2022-04-08
  Administered 2022-04-08: 1 via ORAL
  Filled 2022-04-08: qty 1

## 2022-04-08 MED ORDER — FENTANYL (PF) 50 MCG/ML INJECTION WRAPPER
INJECTION | Freq: Once | INTRAMUSCULAR | Status: DC | PRN
Start: 2022-04-08 — End: 2022-04-08
  Administered 2022-04-08 (×2): 50 ug via INTRAVENOUS

## 2022-04-08 MED ORDER — ROPIVACAINE (PF) 2 MG/ML (0.2 %) INJECTION SOLUTION
INTRAMUSCULAR | Status: AC
Start: 2022-04-08 — End: 2022-04-08
  Filled 2022-04-08: qty 10

## 2022-04-08 MED ORDER — SODIUM CHLORIDE 0.9 % INTRAVENOUS PIGGYBACK
INJECTION | INTRAVENOUS | Status: AC
Start: 2022-04-08 — End: 2022-04-08
  Filled 2022-04-08: qty 100

## 2022-04-08 MED ORDER — PROPOFOL 10 MG/ML IV BOLUS
INJECTION | Freq: Once | INTRAVENOUS | Status: DC | PRN
Start: 2022-04-08 — End: 2022-04-08
  Administered 2022-04-08 (×2): 20 mg via INTRAVENOUS

## 2022-04-08 MED ORDER — SODIUM CHLORIDE 0.9 % INTRAVENOUS PIGGYBACK
2.0000 g | Freq: Once | INTRAVENOUS | Status: AC
Start: 2022-04-08 — End: 2022-04-08
  Administered 2022-04-08: 2 g via INTRAVENOUS

## 2022-04-08 MED ORDER — FAMOTIDINE (PF) 20 MG/2 ML INTRAVENOUS SOLUTION
20.0000 mg | Freq: Once | INTRAVENOUS | Status: AC
Start: 2022-04-08 — End: 2022-04-08
  Administered 2022-04-08: 20 mg via INTRAVENOUS

## 2022-04-08 MED ORDER — PROPOFOL 10 MG/ML INTRAVENOUS EMULSION
INTRAVENOUS | Status: DC | PRN
Start: 2022-04-08 — End: 2022-04-08
  Administered 2022-04-08: 0 ug/kg/min via INTRAVENOUS
  Administered 2022-04-08: 50 ug/kg/min via INTRAVENOUS
  Administered 2022-04-08: 60 ug/kg/min via INTRAVENOUS

## 2022-04-08 MED ORDER — FENTANYL (PF) 50 MCG/ML INJECTION WRAPPER
25.0000 ug | INJECTION | INTRAMUSCULAR | Status: DC | PRN
Start: 2022-04-08 — End: 2022-04-08

## 2022-04-08 MED ORDER — MIDAZOLAM 5 MG/ML INJECTION WRAPPER
1.5000 mg | Freq: Once | INTRAMUSCULAR | Status: DC | PRN
Start: 2022-04-08 — End: 2022-04-08
  Administered 2022-04-08: 1.5 mg via INTRAVENOUS

## 2022-04-08 SURGICAL SUPPLY — 81 items
ALLODERM 4X2CM THK.55-.25 X THN RECT ACLR DRML ALLOGRAFT RGNRT MATRIX TISS (IMPLANTS GRAFT/TISSUE) ×1 IMPLANT
BAG BIOHAZ RD 30X24IN THK3 MIL C8-10GL LLDPE INFCT WASTE CAN (MED SURG SUPPLIES) ×2
BAG BIOHAZ RD 30X24IN THK3 MIL C8-10GL LLDPE INFCT WASTE CAN PNCT RST (MED SURG SUPPLIES) ×1
BAG BIOHAZ RD 43X30IN THK3 MIL C20-30GL LLDPE INFCT WASTE (MED SURG SUPPLIES) ×2
BAG BIOHAZ RD 43X30IN THK3 MIL C20-30GL LLDPE INFCT WASTE CAN PNCT RST (MED SURG SUPPLIES) ×1 IMPLANT
BAG SUT DVN STRL LF (SUTURE/WOUND CLOSURE) ×1 IMPLANT
BAG SUTURE DEVON STERILE LATEX FREE (SUTURE/WOUND CLOSURE) ×2
BANDAGE 3.6YDX3.4IN 6 PLY HYPOALL LOFT LIGHT STRCH COTTON GAUZE STRL LF  DISP (WOUND CARE SUPPLY) ×1 IMPLANT
BANDAGE COFLX 5YDX4IN NONST CHSV SLF ADH FOAM COMPRESS TAN LF (WOUND CARE SUPPLY) ×1 IMPLANT
BANDAGE COFLX 5YDX4IN NONST CH_SV SLF ADH FOAM COMPRESS TAN (WOUND CARE/ENTEROSTOMAL SUPPLY) ×2
BANDAGE ESMARK NV+ 9FTX3IN STRL COMPRESS LF  DISP (WOUND CARE SUPPLY) ×1 IMPLANT
BANDAGE PRESSURE 9X3FT NOVAPLUS ESMK V1831 9 20/CS NON-LATEX (WOUND CARE/ENTEROSTOMAL SUPPLY) ×2
BLADE 15 2 END CBNSTL SURG STRL DISP (CUTTING ELEMENTS) ×4
BLADE 15 2 END CBNSTL SURG STRL DISP (SURGICAL CUTTING SUPPLIES) ×4 IMPLANT
CLEANER INSTR PREPZYME MUL-TRD CONTAINR NARSL NEUT PH BDGR (MISCELLANEOUS PT CARE ITEMS) ×2
CONV USE 123874 - SYRINGE AMSURE MDCHC 60CC LF  STRL TIP PRTC SM TUBE ADPR IRRG DISC BULB POLYPROP (MED SURG SUPPLIES) ×1 IMPLANT
CONV USE 31829 - NEEDLE HYPO  18GA 1.5IN STD REG BVL LF (MED SURG SUPPLIES) ×1 IMPLANT
CONV USE ITEM 321837 - GLOVE SURG 7.5 LTX PF NONST CRM (GLOVES AND ACCESSORIES) ×1 IMPLANT
CONV USE ITEM 321983 - GLOVE SURG 8 LTX CHEMO PF SMOOTH BEAD CUF STRL WHT 11.6IN PLMR THK.2MM THK.21MM (GLOVES AND ACCESSORIES) ×1 IMPLANT
CONV USE ITEM 322852 - ROLL ECODRI-SAFE ABS POLY BCK ORTHO (ORTHOPEDICS (NOT IMPLANTS)) ×1 IMPLANT
CONV USE ITEM 323185 - PAD EG 15SQ IN UNIV FOAM SPLT NONCORD ADULT 9100 SER (SURGICAL CUTTING SUPPLIES) ×1 IMPLANT
CONV USE ITEM 329146 - CLEANER INSTR PREPZYME MUL-TRD CONTAINR NARSL NEUT PH BDGR 22OZ (MISCELLANEOUS PT CARE ITEMS) ×1 IMPLANT
CONV USE ITEM 338662 - PACK SURG ASCP STRL DISP ~~LOC~~ BPT MED CNTR LF (CUSTOM TRAYS & PACK) ×1 IMPLANT
CONV USE ITEM 34153 - ELECTRODE ESURG BLADE PNCL 3/32IN STRL SS CAUT PSHBTN STD SHAFT LF  VEGA SER (SURGICAL CUTTING SUPPLIES) ×1 IMPLANT
CONV USE ITEM 343591 - SOLIDIFY FLUID 1500CC NONST LF  PREM SOLIDIFY + (MED SURG SUPPLIES) ×1 IMPLANT
CONV USE ITEM 46133 - SUTURE 4-0 C-13 MONOSOF 18IN BLK MONOF NONAB (SUTURE/WOUND CLOSURE) ×4 IMPLANT
CONV USE ITEM 81225 - BAG BIOHAZ RD 30X24IN THK3 MIL C8-10GL LLDPE INFCT WASTE CAN PNCT RST (MED SURG SUPPLIES) ×1 IMPLANT
COUNTER 20 CNT BLOCK ADH NEEDLE STRL LF  RD SHARP FOAM 15.75X11.5X14IN DISP (MED SURG SUPPLIES) ×1 IMPLANT
COUNTER 20 CNT BLOCK ADH NEEDLE STRL LF RD SHARP FOAM 15.75 (MED SURG SUPPLIES) ×2
COVER TBL 90X50IN STD SMS REINF FNFLD STRL LF  DISP (DRAPE/PACKS/SHEETS/OR TOWEL) ×2 IMPLANT
COVER TBL 90X50IN STD SMS REINF FNFLD STRL LF DISP (DRAPE/PACKS/SHEETS/OR TOWEL) ×4
DRESS PETRO 9X5IN CURAD XR COTTON NONADH OCL IMPREGNATE LF  STRL WHT (WOUND CARE SUPPLY) ×1 IMPLANT
DRESS PETRO 9X5IN CURAD XR COT_TON NONADH OCL IMPREGNATE LF (WOUND CARE/ENTEROSTOMAL SUPPLY) ×2
DURAPREP 26ML 8630 CS/20 (MED SURG SUPPLIES) ×2
ELECTRODE ESURG BLADE PNCL 3/32IN STRL SS CAUT PSHBTN STD (CUTTING ELEMENTS) ×2
GAUZE BND ROLL 3.4IN X 3.6 YDS_MEDC (WOUND CARE/ENTEROSTOMAL SUPPLY) ×2
GLOVE SURG 7 LF  PF STRL PLISPRN DISP (GLOVES AND ACCESSORIES) ×1 IMPLANT
GLOVE SURG 7 LF PF SMOOTH STRL WHT PLISPRN (GLOVES AND ACCESSORIES) ×2
GLOVE SURG 7.5 LTX PF SMOOTH STRL CRM (GLOVES AND ACCESSORIES) ×2
GLOVE SURG 8 LF  BEAD CUF DERMASHIELD PLISPRN (GLOVES AND ACCESSORIES) ×1 IMPLANT
GLOVE SURG 8 LF PF SMOOTH STRL WHT PLISPRN (GLOVES AND ACCESSORIES) ×2
GLOVE SURG 8 LTX PF SMOOTH STRL CRM (GLOVES AND ACCESSORIES) ×2
GOWN SURG LRG STD LGTH REG L3 NONREINFORCE BRTHBL TWL STRL (DRAPE/PACKS/SHEETS/OR TOWEL) ×2
GOWN SURG LRG STD LGTH REG L3 NONREINFORCE BRTHBL TWL STRL LF  DISP BLU HALYARD SPECTRUM SMS (DRAPE/PACKS/SHEETS/OR TOWEL) ×1 IMPLANT
GOWN SURG XL STD LGTH L3 NONREINFORCE HKLP CLSR TWL STRL LF (DRAPE/PACKS/SHEETS/OR TOWEL) ×2
GOWN SURG XL STD LGTH L3 NONREINFORCE HKLP CLSR TWL STRL LF  DISP BLU SPECTRUM SMS (DRAPE/PACKS/SHEETS/OR TOWEL) ×1
GOWN SURG XL STD LGTH L3 NONREINFORCE HKLP CLSR TWL STRL LF DISP BLU SPECTRUM SMS (DRAPE/PACKS/SHEETS/OR TOWEL) ×1 IMPLANT
HDPE THK22 UM C40-45 GL L48 IN X W40 IN NATURAL (MISCELLANEOUS PT CARE ITEMS) ×2 IMPLANT
LINER SUCT MEDIVAC CRD TW LOCK LID SHTOF VALVE CAN PORT 3L LF  DISP (MED SURG SUPPLIES) ×1 IMPLANT
LINER SUCT MEDIVAC CRD TW LOCK_LID SHTOF VALVE CAN PORT 3L (MED SURG SUPPLIES) ×2
NEEDLE HYPO  25GA 1.25IN STD MONOJECT AL SS REG BVL LL HUB UL SHRP ANTICORE RD STRL LF  DISP (MED SURG SUPPLIES) ×2 IMPLANT
NEEDLE HYPO 18GA 1.5IN STD RE_G BVL LF (MED SURG SUPPLIES) ×2
NEEDLE HYPO 25GA 1.25IN STD M_ONOJECT AL SS REG BVL LL HUB (MED SURG SUPPLIES) ×4
PACK ARTHROGRAM MDLN INDUSTRIES INC. RAD GEN N/M S DISP (CUSTOM TRAYS & PACK) ×2
PACK SURG ASCP STRL DISP ~~LOC~~ BPT MED CNTR LF (CUSTOM TRAYS & PACK) ×1
PAD EG 15SQ IN UNIV FOAM SPLT NONCORD ADULT 9100 SER (CUTTING ELEMENTS) ×2
ROLL ECODRI-SAFE ABS POLY BCK ORTHO (ORTHOPEDICS (NOT IMPLANTS)) ×2
SOL SURG PREP 26ML DRPRP 74% ISPRP 0.7% IOD POVACRYLEX SLF CNTN APPL SKIN STRL PREOP (MED SURG SUPPLIES) ×1 IMPLANT
SOLIDIFY FLUID 1500CC NONST LF  PREM SOLIDIFY + (MED SURG SUPPLIES) ×1
SOLIDIFY FLUID 1500CC NONST LF PREM SOLIDIFY + (MED SURG SUPPLIES) ×2
SPONGE GAUZE 4X4IN MDCHC COTTON 12 PLY TY 7 LF  STRL DISP (WOUND CARE SUPPLY) ×1 IMPLANT
SPONGE GAUZE 4X4IN MDCHC COTTO_N 12 PLY TY 7 LF STRL DISP (WOUND CARE/ENTEROSTOMAL SUPPLY) ×2
SPONGE SURG 4X4IN 16 PLY XRY DETECT COTTON STRL LF  DISP (WOUND CARE SUPPLY) ×1 IMPLANT
SPONGE SURG 4X4IN 16 PLY_RADOPQ COT STRL LF DISP (WOUND CARE/ENTEROSTOMAL SUPPLY) ×2
STKNT ORTHO 48X4IN COTTON PLSTR 1 PLY PCUT SEWN END LF  TUB STRL OFF WHT (ORTHOPEDICS (NOT IMPLANTS)) ×1 IMPLANT
STKNT ORTHO 48X4IN COTTON PLSTR 1 PLY PCUT SEWN END LF TUB (ORTHOPEDICS (NOT IMPLANTS)) ×2
SUTURE 3-0 C-17 MONOSOF 30IN BLK MONOF NONAB (SUTURE/WOUND CLOSURE) ×4 IMPLANT
SUTURE 4-0 C-13 MONOSOF 18IN BLK MONOF NONAB (SUTURE/WOUND CLOSURE) ×8
SYRINGE AMSURE MDCHC 60CC LF  STRL TIP PRTC SM TUBE ADPR IRRG DISC BULB POLYPROP (MED SURG SUPPLIES) ×1
SYRINGE AMSURE MDCHC 60CC LF STRL TIP PRTC SM TUBE ADPR (MED SURG SUPPLIES) ×2
SYRINGE LL 10ML LF  STRL GRAD N-PYRG DEHP-FR PVC FREE MED DISP (MED SURG SUPPLIES) ×2 IMPLANT
SYRINGE LL 10ML LF STRL MED D_ISP (MED SURG SUPPLIES) ×4
TOWEL 24X16IN COTTON BLU DISP SURG STRL LF (DRAPE/PACKS/SHEETS/OR TOWEL) ×4 IMPLANT
TUBE BUBBLE CONNECTING_8888280214 1EA/BX/CS (MED SURG SUPPLIES) ×2
TUBING SUCT CLR 100FT 3/16IN ARGYLE UNIV PVC NCDTV BBL NONST LF (MED SURG SUPPLIES) ×1 IMPLANT
TUBING SUCT CLR 6FT .25IN ARGYLE PVC NCDTV STR MALE FEMALE (MED SURG SUPPLIES) ×2
TUBING SUCT CLR 6FT .25IN ARGYLE PVC NCDTV STR MALE FEMALE MLD CONN STRL LF (MED SURG SUPPLIES) ×1 IMPLANT
WATER STRL 1000ML PRSV FR N-PYRG DEHP-FR PLASTIC PR BTL AQLT (MED SURG SUPPLIES) ×2
WATER STRL 1000ML PRSV FR N-PYRG DEHP-FR PLASTIC PR BTL AQLT LF (MED SURG SUPPLIES) ×1 IMPLANT
WOUND IRRG IRRISEPT DBRD CLNSG 0.05% CHG SYSTEM STRL LF (WOUND CARE SUPPLY) ×1 IMPLANT
WOUND IRRG IRRISEPT DBRD CLNSG_0.05% CHG SYSTEM STRL LF (WOUND CARE/ENTEROSTOMAL SUPPLY) ×2

## 2022-04-08 NOTE — OR Nursing (Signed)
Alloderm Select Tissue Matrix taken to OR 2 at 1053.  Opened for use and placed in sterile field at 1110.  Given to [place], [facility] [employer] Assist.  Prepared for use and applied to operative site by Dr. Donovan Kail.

## 2022-04-08 NOTE — Anesthesia Postprocedure Evaluation (Signed)
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    Patient is sufficiently recovered from the effects of anesthesia to participate in the evaluation and has returned to their pre-procedure level.  Patient location during evaluation: PACU       Patient participation: complete - patient participated  Level of consciousness: awake and alert and responsive to verbal stimuli    Pain management: adequate  Airway patency: patent    Anesthetic complications: no  Cardiovascular status: acceptable  Respiratory status: acceptable  Hydration status: acceptable  Patient post-procedure temperature: Pt Normothermic   PONV Status: Absent

## 2022-04-08 NOTE — Brief Op Note (Signed)
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 WITH CLOSURE OF DEFECT WITH SKIN FLAP FROM LEFT FOOT AND APPLICATION OF ALLODERM TISSUE MATRIX: 85929 (CPT)     Attending Surgeon: Leland Her, DPM     Anesthesia Staff:  Anesthesiologist: Ardelle Anton, DO  CRNA: Judyann Munson, CRNA    Anesthesia Type: .Monitor Anesthesia Care     Estimated Blood Loss:  Minimal    Specimens Removed:   ID Type Source Tests Collected by Time Destination   1 : SQUAMOUS CELL CARCINOMA  DORSAL LEFT FOOT (SUTURE = PROXIMAL [facility]) Tissue Skin SURGICAL PATHOLOGY SPECIMEN Leland Her, DPM 04/08/2022 0918       Order Name Source Comment Collection Info Order Time   SURGICAL PATHOLOGY SPECIMEN Skin Pre-op diagnosis:  SQUAMOUS CELL CARCINOMA LEFT FOOT Collected By: Leland Her, DPM 04/08/2022 10:06 AM     Release to patient   Automated             Complications:  None    Condition:  Stable    Disposition:   PACU - hemodynamically stable.    Indications:  Patient is a 56 y.o. female who presents for excision of squamous cell carcinoma from dorsal L foot with closure using skin flap and skin graft due to skin cancer.    Description of Procedure/Operation:  The patient was brought to the operating suite, placed supine upon the operating room table where anesthesia services provided MAC anesthesia. The patient's LLE was then prepped and draped in usual sterile fashion in anticipation of surgery noted above.   Attention was then directed to the dorsal aspect of the left foot.  The hyper pigmented area of skin was then excised it in its entirety with 3 mm margins being used.  This defect was measured at 3.5 x 3 cm.  It was  then sent to pathology for a fresh frozen that was confirmed that clear margins were obtained.  Then utilizing skin flap technique rotational flap was then developed and transitioned into the area and the resulting defect was then too large to get closed and a 2nd flap was then developed and transitioned into this area.  Flap closure was then obtained for approximately 80% of the initial defect with remaining area that was unable to be closed over measuring at 2 x 1.2 cm.  At this time decision was made to apply AlloDerm Select RTM skin graft over the area that was unable to be closed.  A nonadherent dry sterile dressing was then applied over the surgical site.  Tourniquet was deflated and a prompt hyperemic response was noted to the left foot and digits of the left foot.  The patient tolerated the procedure well and was returned to the postanesthesia care unit in stable condition after extubation in the operating suite.  Prior to closure all sponge, instrument, and needle counts were correct with excellent wound hemostasis.  Leland Her, DPM

## 2022-04-08 NOTE — Discharge Instructions (Addendum)
FOLLOW UP WITH DR. Donovan Kail    Thursday   June 29,2023    10:00AM   [facility]    DO NOT remove dressing or get it wet.  Dr will change at follow up appt.    Full weight bearing to surgery foot.   WITH SURGICAL SHOE    Ice pack 4mn on 10 min off.    Elevate your surgery foot    Call Dr for any problems.     DO NOT forget to get your prescriptions at the pharmacy.     PERCOCET '5MG'$   TAKE AS PRESCRIBED   NR

## 2022-04-08 NOTE — Anesthesia Transfer of Care (Signed)
ANESTHESIA TRANSFER OF CARE   Gail Morris is a 56 y.o. 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 Anxiety    . Breast cancer (CMS [place])    . Cancer (CMS [place])    . Depression    . Diabetes mellitus, type 2 (CMS [facility])    . Esophageal reflux    . H/O blood clots    . Hiccups    . HTN (hypertension)    . Migraine    . Neuropathy (CMS HCC)    . Primary generalized (osteo)arthritis       Allergy History as of 04/08/22     TRAMADOL       Noted Status Severity Type Reaction    04/08/22 0700 Melinda Crutch, LPN 93/23/55 Active    Other Adverse Reaction (Add comment)    Comments: Tachycardia       12/27/21 2336 Lenore Manner, RN 12/27/21 Active             DULAGLUTIDE       Noted Status Severity Type Reaction    04/08/22 0701 Melinda Crutch, LPN 73/22/02 Active    Other Adverse Reaction (Add comment)    Comments: Loss of appetite     12/27/21 2336 Lenore Manner, RN 12/27/21 Active             METFORMIN       Noted Status Severity Type Reaction    04/08/22 0700 Melinda Crutch, LPN 54/27/06 Active    Other Adverse Reaction (Add comment)    Comments: "Dehydrates me"     12/27/21 2336 Lenore Manner, RN 12/27/21 Active                 I completed my transfer of care / handoff to the receiving personnel during which we discussed:  Access, Airway, All key/critical aspects of case discussed, Analgesia, Antibiotics, Expectation of post procedure, Fluids/Product, Gave opportunity for questions and acknowledgement of understanding, Labs and PMHx  Report given to: Toribio Harbour, LPN    Post Location: PACU                                                           Last OR Temp: Temperature: 36.6 C (97.8 F)  ABG:  POTASSIUM   Date Value Ref Range Status    04/03/2022 3.7 3.5 - 5.1 mmol/L Final     KETONES   Date Value Ref Range Status   04/03/2022 Negative Negative, Trace mg/dL Final     CALCIUM   Date Value Ref Range Status   04/03/2022 9.4 8.6 - 10.3 mg/dL Final     Calculated P Axis   Date Value Ref Range Status   04/03/2022 73 degrees Final     Calculated R Axis   Date Value Ref Range Status   04/03/2022 67 degrees Final     Calculated T Axis   Date Value Ref Range Status   04/03/2022 67 degrees Final     Airway:* No LDAs found *  Blood pressure [name])  141/92, pulse 67, temperature 36.6 C (97.8 F), resp. rate 12, height 1.575 m ('5\' 2"'$ ), weight 52.2 kg (115 lb), SpO2 100 %.

## 2022-04-08 NOTE — H&P (Signed)
[facility]  H&P Update Form    Gail Morris, Gail Morris, 56 y.o. female  Encounter Start Date:  04/08/2022  Inpatient Admission Date:   Date of Birth:  02/14/1966    04/08/2022    STOP: IF H&P IS GREATER THAN 30 DAYS FROM SURGICAL DAY COMPLETE NEW H&P IS REQUIRED.     H & P updated the day of the procedure.  1.  H&P completed within 30 days of surgical procedure by pt's PCP  and has been reviewed within 24 hours of the surgery, the patient has been examined, and no change has occured in the patients condition since the H&P was completed.       Change in medications: No          Last Menstrual Period: Not applicable      Comments:     2.  Patient continues to be appropiate candidate for planned surgical procedure. YES      Leland Her, DPM

## 2022-04-08 NOTE — Anesthesia Preprocedure Evaluation (Signed)
ANESTHESIA PRE-OP EVALUATION  Planned Procedure: EXCISION OF SQUAMOUS CELL CARCINOMA LEFT FOOT (Left: Foot)  Review of Systems     anesthesia history negative     patient summary reviewed  nursing notes reviewed        Pulmonary   current smoker,   Cardiovascular    Hypertension and ECG reviewed ,No peripheral edema,        GI/Hepatic/Renal    GERD        Endo/Other      type 2 diabetes/ controlled with insulin    Neuro/Psych/MS    headaches, anxiety, depression    peripheral neuropathy,  Cancer  CA,   Bilateral breast cancer,                   Physical Assessment      Airway       Mallampati: II    TM distance: >3 FB    Mouth Opening: good.            Dental                    Pulmonary    Breath sounds clear to auscultation  (-) no rhonchi, no decreased breath sounds, no wheezes, no rales and no stridor     Cardiovascular    Rhythm: regular  Rate: Normal  (-) no friction rub, carotid bruit is not present, no peripheral edema and no murmur     Other findings            Plan  ASA 3     Planned anesthesia type: MAC                     Intravenous induction     Anesthesia issues/risks discussed are: Dental Injuries, Stroke, Sore Throat and Cardiac Events/MI.  Anesthetic plan and risks discussed with patient             Patient's NPO status is appropriate for Anesthesia.                    implanted dentures

## 2022-04-09 DIAGNOSIS — D0472 Carcinoma in situ of skin of left lower limb, including hip: Secondary | ICD-10-CM

## 2022-04-09 LAB — SURGICAL PATHOLOGY SPECIMEN

## 2022-04-12 ENCOUNTER — Other Ambulatory Visit: Payer: Self-pay

## 2022-04-12 ENCOUNTER — Emergency Department
Admission: EM | Admit: 2022-04-12 | Discharge: 2022-04-12 | Disposition: A | Discharge status: Home / Self Care | Payer: MEDICAID | Attending: Family | Admitting: Family

## 2022-04-12 ENCOUNTER — Encounter (HOSPITAL_BASED_OUTPATIENT_CLINIC_OR_DEPARTMENT_OTHER): Payer: Self-pay

## 2022-04-12 DIAGNOSIS — Z4800 Encounter for change or removal of nonsurgical wound dressing: Secondary | ICD-10-CM

## 2022-04-12 DIAGNOSIS — Z945 Skin transplant status: Secondary | ICD-10-CM

## 2022-04-12 DIAGNOSIS — Z4801 Encounter for change or removal of surgical wound dressing: Secondary | ICD-10-CM | POA: Insufficient documentation

## 2022-04-12 NOTE — ED Nurses Note (Signed)
Patient discharged home with family.  AVS reviewed with patient/care giver.  A written copy of the AVS and discharge instructions was given to the patient/care giver.  Questions sufficiently answered as needed.  Patient/care giver encouraged to follow up with PCP as indicated.  In the event of an emergency, patient/care giver instructed to call 911 or go to the nearest emergency room. LEFT foot wrapped in Kurlex and [place] and made pt aware not to get dressing wet

## 2022-04-12 NOTE — ED Provider Notes (Signed)
[facility], [facility] Emergency Department  ED Primary Provider Note  History of Present Illness   Chief Complaint   Patient presents with   . Skin Problem     Arrival: The patient arrived by Ambulance    Gail Morris is a 56 y.o. female who had concerns including Skin Problem.  Pt states she needs a dressing change. Had skin graft left dorsal foot for basal cell ca dr Alf[facility]. States took shower left foot out as instructed but water ran down and wet dressing. No other co. surg done on Monday.     Review of Systems   Constitutional: No fever, chills or weakness   Skin: No rash or diaphoresis, wet dressing skin graft.   HENT: No headaches, or congestion  Eyes: No vision changes or photophobia   Cardio: No chest pain, palpitations or leg swelling   Respiratory: No cough, wheezing or SOB  GI:  No nausea, vomiting or stool changes  GU:  No dysuria, hematuria, or increased frequency  MSK: No muscle aches, joint or back pain  Neuro: No seizures, LOC, numbness, tingling, or focal weakness  Psychiatric: No depression, SI or substance abuse  All other systems reviewed and are negative.    Historical Data   History Reviewed This Encounter:  All noted and reviewed.     Physical Exam   ED Triage Vitals [04/12/22 2126]   BP (Non-Invasive) (!) 159/94   Heart Rate 78   Respiratory Rate 20   Temperature 36.8 C (98.2 F)   SpO2 98 %   Weight 52.2 kg (115 lb)   Height 1.575 m ('5\' 2"'$ )       Constitutional:  56 y.o. female who appears in no distress. Normal color, no cyanosis.   HENT:   Head: Normocephalic and atraumatic.   Mouth/Throat: Oropharynx is clear and moist.   Eyes: EOMI, PERRL   Neck: Trachea midline. Neck supple.  Cardiovascular: RRR, No murmurs, rubs or gallops. Intact distal pulses.  Pulmonary/Chest: BS equal bilaterally. No respiratory distress. No wheezes, rales or chest tenderness.   Abdominal: Bowel sounds present and normal. Abdomen soft, no tenderness,  no rebound and no guarding.  Back: No midline spinal tenderness, no paraspinal tenderness, no CVA tenderness.           Musculoskeletal: No edema, tenderness or deformity.  Skin: warm and dry. No rash, erythema, pallor or cyanosis. Skin graft intact xeroform dressing intact directly over wound is dry. Remainder or dressing wet no redness no DC no bleeding. Sensation intact.   Psychiatric: normal mood and affect. Behavior is normal.   Neurological: Patient keenly alert and responsive, easily able to raise eyebrows, facial muscles/expressions symmetric, speaking in fluent sentences, moving all extremities equally and fully, normal gait  Patient Data   Labs Ordered/Reviewed - No data to display  No orders to display     Medical Decision Making  diff dx of dressing change. Wound infection, wound complication.          Clinical Impression   Dressing change (Primary)   H/O skin graft       Disposition: Data Unavailable

## 2022-04-12 NOTE — ED Triage Notes (Signed)
EMS reports pt recently had skin graft to left foot. Pt got dressing wet, requests dressing change

## 2022-04-25 ENCOUNTER — Emergency Department
Admission: EM | Admit: 2022-04-25 | Discharge: 2022-04-25 | Disposition: A | Discharge status: Home / Self Care | Payer: MEDICAID | Attending: Family | Admitting: Family

## 2022-04-25 ENCOUNTER — Other Ambulatory Visit: Payer: Self-pay

## 2022-04-25 ENCOUNTER — Emergency Department (HOSPITAL_BASED_OUTPATIENT_CLINIC_OR_DEPARTMENT_OTHER): Payer: MEDICAID

## 2022-04-25 ENCOUNTER — Encounter (HOSPITAL_BASED_OUTPATIENT_CLINIC_OR_DEPARTMENT_OTHER): Payer: Self-pay

## 2022-04-25 DIAGNOSIS — S20212A Contusion of left front wall of thorax, initial encounter: Secondary | ICD-10-CM | POA: Insufficient documentation

## 2022-04-25 DIAGNOSIS — W19XXXA Unspecified fall, initial encounter: Secondary | ICD-10-CM | POA: Insufficient documentation

## 2022-04-25 DIAGNOSIS — Z8589 Personal history of malignant neoplasm of other organs and systems: Secondary | ICD-10-CM | POA: Insufficient documentation

## 2022-04-25 LAB — URINALYSIS, MACRO/MICRO
BILIRUBIN: NEGATIVE mg/dL
BLOOD: NEGATIVE mg/dL
GLUCOSE: >=1000 mg/dL — AB
LEUKOCYTES: NEGATIVE WBCs/uL
NITRITE: NEGATIVE
PH: 5.5 (ref 4.6–8.0)
PROTEIN: NEGATIVE mg/dL
SPECIFIC GRAVITY: 1.01 (ref 1.003–1.035)
UROBILINOGEN: 0.2 mg/dL (ref 0.2–1.0)

## 2022-04-25 MED ORDER — CYCLOBENZAPRINE 10 MG TABLET
10.0000 mg | ORAL_TABLET | Freq: Three times a day (TID) | ORAL | 0 refills | Status: DC | PRN
Start: 2022-04-25 — End: 2022-08-31

## 2022-04-25 MED ORDER — HYDROCODONE 5 MG-ACETAMINOPHEN 325 MG TABLET
2.0000 | ORAL_TABLET | ORAL | Status: AC
Start: 2022-04-25 — End: 2022-04-25
  Administered 2022-04-25: 2 via ORAL

## 2022-04-25 MED ORDER — HYDROCODONE 5 MG-ACETAMINOPHEN 325 MG TABLET
ORAL_TABLET | ORAL | Status: AC
Start: 2022-04-25 — End: 2022-04-25
  Filled 2022-04-25: qty 2

## 2022-04-25 MED ORDER — KETOROLAC 10 MG TABLET
10.0000 mg | ORAL_TABLET | Freq: Four times a day (QID) | ORAL | 0 refills | Status: DC | PRN
Start: 2022-04-25 — End: 2022-08-31

## 2022-04-25 NOTE — ED Provider Notes (Signed)
[facility], [facility] Emergency Department  ED Primary Provider Note  History of Present Illness   Chief Complaint   Patient presents with   . Rib Pain     Left rib pain     Arrival: The patient arrived by Car    Gail Morris is a 56 y.o. female who had concerns including Rib Pain.  Fell left rib pain. Denies sob. States every breath dont hurt but certain moves causes hurting denies hx of clots. hxof ca in foot.   Review of Systems   Constitutional: No fever, chills or weakness   Skin: No rash or diaphoresis  HENT: No headaches, or congestion  Eyes: No vision changes or photophobia   Cardio: No chest pain, palpitations or leg swelling  + left pain.   Respiratory: No cough, wheezing or SOB  GI:  No nausea, vomiting or stool changes  GU:  No dysuria, hematuria, or increased frequency  MSK: No muscle aches, joint or back pain  Neuro: No seizures, LOC, numbness, tingling, or focal weakness  Psychiatric: No depression, SI or substance abuse  All other systems reviewed and are negative.    Historical Data   History Reviewed This Encounter: all noted and reviewed.   Physical Exam   ED Triage Vitals [04/25/22 2118]   BP (Non-Invasive) (!) 144/98   Heart Rate (!) 113   Respiratory Rate 20   Temperature 36.7 C (98 F)   SpO2 95 %   Weight 50.8 kg (112 lb)   Height 1.575 m (5' 2")       Constitutional:  56 y.o. female who appears in no distress. Normal color, no cyanosis.   HENT:   Head: Normocephalic and atraumatic.   Mouth/Throat: Oropharynx is clear and moist.   Eyes: EOMI, PERRL   Neck: Trachea midline. Neck supple.  Cardiovascular: RRR, No murmurs, rubs or gallops. Intact distal pulses.  Pulmonary/Chest: BS equal bilaterally. No respiratory distress. No wheezes, rales + left lateral  chest tenderness. No bruising.  Abdominal: Bowel sounds present and normal. Abdomen soft, no tenderness, no rebound and no guarding.  Back: No midline spinal tenderness, no paraspinal tenderness, no CVA  tenderness.           Musculoskeletal: No edema, tenderness or deformity.  Skin: warm and dry. No rash, erythema, pallor or cyanosis  Psychiatric: normal mood and affect. Behavior is normal.   Neurological: Patient keenly alert and responsive, easily able to raise eyebrows, facial muscles/expressions symmetric, speaking in fluent sentences, moving all extremities equally and fully, normal gait  Patient Data     Labs Ordered/Reviewed   URINALYSIS, MACRO/MICRO - Abnormal; Notable for the following components:       Result Value    GLUCOSE >=1000 (*)     KETONES Trace (*)     All other components within normal limits   URINALYSIS WITH REFLEX MICROSCOPIC AND CULTURE IF POSITIVE    Narrative:     The following orders were created for panel order URINALYSIS WITH REFLEX MICROSCOPIC AND CULTURE IF POSITIVE.  Procedure                               Abnormality         Status                     ---------                               -----------         ------  URINALYSIS, MACRO/MICRO[[id number]]      Abnormal            Final result                 Please view results for these tests on the individual orders.     XR RIBS LEFT   Final Result by Edi, Radresults In (07/13 2224)   NO EVIDENCE OF ACUTE RIB FRACTURE OR PNEUMOTHORAX.               Radiologist location ID: [id number]         XR CHEST AP   Final Result by Edi, Radresults In (07/13 2223)   NO ACUTE FINDINGS.         Radiologist location ID: [place] Decision Making  diff dx of rib fx clot pneumonia. Met ca. Intercostal strain.          Medications Administered in the ED   HYDROcodone-acetaminophen (NORCO) 5-325 mg per tablet (2 Tablets Oral Given 04/25/22 2225)     Clinical Impression   Rib contusion, left, initial encounter (Primary)   Fall       Disposition: Data Unavailable

## 2022-04-25 NOTE — ED Nurses Note (Signed)
Patient discharged home with family.  AVS reviewed with patient/care giver.  A written copy of the AVS and discharge instructions was given to the patient/care giver. Scripts sent to pharmacy. Questions sufficiently answered as needed.  Patient/care giver encouraged to follow up with PCP as indicated.  In the event of an emergency, patient/care giver instructed to call 911 or go to the nearest emergency room.

## 2022-04-25 NOTE — Discharge Instructions (Addendum)
If worse or not better, return immediately.

## 2022-04-25 NOTE — ED Triage Notes (Signed)
Pt c/o left rib pain. Pain started approx four days ago. Pt states is worse today than it has been. Pt fell four days ago.

## 2022-04-28 ENCOUNTER — Other Ambulatory Visit: Payer: Self-pay

## 2022-06-03 ENCOUNTER — Encounter (HOSPITAL_BASED_OUTPATIENT_CLINIC_OR_DEPARTMENT_OTHER): Payer: Self-pay

## 2022-06-03 ENCOUNTER — Emergency Department
Admission: EM | Admit: 2022-06-03 | Discharge: 2022-06-03 | Disposition: A | Discharge status: Home / Self Care | Payer: MEDICAID | Attending: Emergency Medicine | Admitting: Emergency Medicine

## 2022-06-03 ENCOUNTER — Other Ambulatory Visit: Payer: Self-pay

## 2022-06-03 DIAGNOSIS — K047 Periapical abscess without sinus: Secondary | ICD-10-CM | POA: Insufficient documentation

## 2022-06-03 DIAGNOSIS — E119 Type 2 diabetes mellitus without complications: Secondary | ICD-10-CM | POA: Insufficient documentation

## 2022-06-03 DIAGNOSIS — R030 Elevated blood-pressure reading, without diagnosis of hypertension: Secondary | ICD-10-CM | POA: Insufficient documentation

## 2022-06-03 DIAGNOSIS — F17200 Nicotine dependence, unspecified, uncomplicated: Secondary | ICD-10-CM | POA: Insufficient documentation

## 2022-06-03 MED ORDER — AMOXICILLIN 500 MG CAPSULE
500.0000 mg | ORAL_CAPSULE | ORAL | Status: AC
Start: 2022-06-03 — End: 2022-06-03
  Administered 2022-06-03: 500 mg via ORAL

## 2022-06-03 MED ORDER — FLUCONAZOLE 150 MG TABLET
150.0000 mg | ORAL_TABLET | Freq: Once | ORAL | 0 refills | Status: AC
Start: 2022-06-03 — End: 2022-06-03

## 2022-06-03 MED ORDER — KETOROLAC 30 MG/ML (1 ML) INJECTION SOLUTION
INTRAMUSCULAR | Status: AC
Start: 2022-06-03 — End: 2022-06-03
  Filled 2022-06-03: qty 1

## 2022-06-03 MED ORDER — AMOXICILLIN 500 MG CAPSULE
ORAL_CAPSULE | ORAL | Status: AC
Start: 2022-06-03 — End: 2022-06-03
  Filled 2022-06-03: qty 1

## 2022-06-03 MED ORDER — KETOROLAC 30 MG/ML (1 ML) INJECTION SOLUTION
15.0000 mg | INTRAMUSCULAR | Status: DC
Start: 2022-06-03 — End: 2022-06-03

## 2022-06-03 MED ORDER — FLUCONAZOLE 100 MG TABLET
150.0000 mg | ORAL_TABLET | ORAL | Status: AC
Start: 2022-06-03 — End: 2022-06-03
  Administered 2022-06-03: 150 mg via ORAL

## 2022-06-03 MED ORDER — FLUCONAZOLE 100 MG TABLET
ORAL_TABLET | ORAL | Status: AC
Start: 2022-06-03 — End: 2022-06-03
  Filled 2022-06-03: qty 2

## 2022-06-03 MED ORDER — AMOXICILLIN 875 MG TABLET
875.0000 mg | ORAL_TABLET | Freq: Two times a day (BID) | ORAL | 0 refills | Status: AC
Start: 2022-06-03 — End: 2022-06-10

## 2022-06-03 MED ORDER — KETOROLAC 30 MG/ML (1 ML) INJECTION SOLUTION
15.0000 mg | INTRAMUSCULAR | Status: AC
Start: 2022-06-03 — End: 2022-06-03
  Administered 2022-06-03: 15 mg via INTRAMUSCULAR

## 2022-06-03 MED ORDER — CHLORHEXIDINE GLUCONATE 0.12 % MOUTHWASH
15.0000 mL | MOUTHWASH | Freq: Two times a day (BID) | 0 refills | Status: AC
Start: 2022-06-03 — End: 2022-06-10

## 2022-06-03 MED ORDER — ACETAMINOPHEN 325 MG TABLET
1000.0000 mg | ORAL_TABLET | ORAL | Status: AC
Start: 2022-06-03 — End: 2022-06-03
  Administered 2022-06-03: 975 mg via ORAL

## 2022-06-03 MED ORDER — ACETAMINOPHEN 325 MG TABLET
ORAL_TABLET | ORAL | Status: AC
Start: 2022-06-03 — End: 2022-06-03
  Filled 2022-06-03: qty 3

## 2022-06-03 NOTE — ED Triage Notes (Signed)
Pt c/o left bottom toothache (broken tooth) and right sided earache since yesterday. States pain is too bad to even sleep

## 2022-06-03 NOTE — ED Provider Notes (Signed)
[facility], [facility] Emergency Department  ED Primary Provider Note  HPI:  Gail Morris is a 56 y.o. female      Patient complains of 2 days of dental pain.  Located to the left maxillary region.  Also complains of right ear pain.  She finds it difficult to lay down on your side having difficulty sleeping.  She is used an antibiotic mouthwash for his pain.  Patient is a type 2 diabetic.  Active smoker.  Full code.  ROS review and negative aside from stated in HPI.    Physical Exam:  ED Triage Vitals [06/03/22 0502]   BP (Non-Invasive) (!) 181/109   Heart Rate 95   Respiratory Rate 18   Temperature 37 C (98.6 F)   SpO2 99 %   Weight 50.8 kg (112 lb)   Height 1.575 m ('5\' 2"'$ )     No acute distress.  Patient awake alert oriented x3.  Mood is appropriate.   Right TM is within normal limits.  Left  TM appears to have a chronic puncture wound. Pupils 3 mm equal round reactive.  Extraocular movements are intact.  Oropharynx is clear.   Partial dentition noted.  Pain appears  to be originating from tooth 20  or 21.  No discrete abscess.  No trismus.Mucous membranes moist.  Trachea midline.  Neck is supple.  Heart has regular rate and rhythm without significant murmurs rubs or gallops.  Lungs are clear to auscultation.  Abdomen soft nontender, nondistended.  Moving all extremities without difficulty.  No rash no edema.      Patient data:  Labs Ordered/Reviewed - No data to display  No orders to display       MDM:    Dental pain.    No evidence of periapical abscess.  Likely dental infection.  There is no airway involvement.  Patient hypertensive to 181/90.  She was started on amoxicillin given Tylenol and Toradol.  Will discharge on Amoxil and antibiotic mouthwash.  Dental referral provided.  I gave strict return to ED instructions in both a verbal and written manner.  She is comfortable with discharge      Discharged  Clinical Impression   Dental infection (Primary)   Elevated blood  pressure reading     Medications Administered in the ED   acetaminophen (TYLENOL) tablet (has no administration in time range)   amoxicillin (AMOXIL) capsule (has no administration in time range)   ketorolac (TORADOL) 30 mg/mL injection (has no administration in time range)        Current Discharge Medication List        START taking these medications.        Details   amoxicillin 875 mg Tablet  Commonly known as: AMOXIL   875 mg, Oral, 2 TIMES DAILY  Qty: 14 Tablet  Refills: 0     chlorhexidine gluconate 0.12 % Mouthwash  Commonly known as: PERIDEX   15 mL, Swish & Spit, 2 TIMES DAILY  Qty: 210 mL  Refills: 0            CONTINUE these medications - NO CHANGES were made during your visit.        Details   busPIRone 5 mg Tablet  Commonly known as: BUSPAR   5 mg, Oral, DAILY  Refills: 0     cyclobenzaprine 10 mg Tablet  Commonly known as: FLEXERIL   10 mg, Oral, 3 TIMES DAILY PRN  Qty: 12 Tablet  Refills: 0  docusate sodium 100 mg Capsule  Commonly known as: COLACE   100 mg, Oral, 2 TIMES DAILY PRN  Refills: 0     fluticasone propionate 50 mcg/actuation Spray, Suspension  Commonly known as: FLONASE   1-2 Sprays, Each Nostril, DAILY PRN  Refills: 0     * Neurontin 800 mg Tablet  Generic drug: gabapentin   800 mg, Oral, 3 TIMES DAILY  Refills: 0     * gabapentin 100 mg Capsule  Commonly known as: NEURONTIN   100 mg, 3 TIMES DAILY  Refills: 0     insulin glargine 100 unit/mL injection (vial)   40 Units, Subcutaneous, NIGHTLY  Refills: 0     ipratropium-albuteroL 0.5 mg-3 mg(2.5 mg base)/3 mL nebulizer solution  Commonly known as: DUONEB   3 mL, Nebulization, 2 TIMES DAILY PRN  Refills: 0     Jardiance 25 mg Tablet  Generic drug: empagliflozin   25 mg, Oral, DAILY  Refills: 0     ketorolac tromethamine 10 mg Tablet  Commonly known as: TORADOL   10 mg, Oral, EVERY 6 HOURS PRN  Qty: 15 Tablet  Refills: 0     ondansetron 4 mg Tablet, Rapid Dissolve  Commonly known as: ZOFRAN ODT   4 mg, Sublingual, EVERY 6 HOURS  PRN  Refills: 0     ramipriL 10 mg Capsule  Commonly known as: ALTACE   10 mg, Oral, DAILY  Refills: 0     Ventolin HFA 90 mcg/actuation oral inhaler  Generic drug: albuterol sulfate   2 Puffs, Inhalation, EVERY 4 HOURS PRN  Refills: 0           * This list has 2 medication(s) that are the same as other medications prescribed for you. Read the directions carefully, and ask your doctor or other care provider to review them with you.

## 2022-06-03 NOTE — Discharge Instructions (Addendum)
You were seen in the emergency department for dental pain.  Your evaluation did not reveal critical condition requiring hospitalization.  Please take antibiotics as prescribed to completion.  Alternate between Tylenol and ibuprofen every 4-6 hours for dental pain.  Contact with a dentist provided in area.  Return to emergency department if you have worsening swelling difficulty breathing or any other concerning symptoms.  Thank you for visiting

## 2022-06-03 NOTE — ED Nurses Note (Signed)
Patient states that earache started hurting yesterday morning, and the tooth pain this morning. Patient rates pain at 8 out of  10. Describes pain has a throbbing sensation.

## 2022-06-03 NOTE — ED Nurses Note (Signed)
Patient discharged home with family.  AVS reviewed with patient/care giver.  A written copy of the AVS and discharge instructions was given to the patient/care giver. Scripts handed to patient/care giver. Questions sufficiently answered as needed.  Patient/care giver encouraged to follow up with PCP as indicated.  In the event of an emergency, patient/care giver instructed to call 911 or go to the nearest emergency room.

## 2022-06-12 ENCOUNTER — Other Ambulatory Visit: Payer: Self-pay

## 2022-06-12 ENCOUNTER — Encounter (INDEPENDENT_AMBULATORY_CARE_PROVIDER_SITE_OTHER): Payer: Self-pay | Admitting: HEMATOLOGY-ONCOLOGY

## 2022-06-12 ENCOUNTER — Ambulatory Visit: Payer: MEDICAID | Attending: HEMATOLOGY-ONCOLOGY

## 2022-06-12 ENCOUNTER — Other Ambulatory Visit (HOSPITAL_COMMUNITY): Payer: MEDICAID

## 2022-06-12 ENCOUNTER — Encounter (HOSPITAL_COMMUNITY): Payer: Self-pay

## 2022-06-12 VITALS — BP 118/72 | HR 84 | Temp 97.2°F | Ht 62.0 in | Wt 115.2 lb

## 2022-06-12 DIAGNOSIS — Z923 Personal history of irradiation: Secondary | ICD-10-CM | POA: Insufficient documentation

## 2022-06-12 DIAGNOSIS — Z9221 Personal history of antineoplastic chemotherapy: Secondary | ICD-10-CM | POA: Insufficient documentation

## 2022-06-12 DIAGNOSIS — F17210 Nicotine dependence, cigarettes, uncomplicated: Secondary | ICD-10-CM | POA: Insufficient documentation

## 2022-06-12 DIAGNOSIS — Z853 Personal history of malignant neoplasm of breast: Secondary | ICD-10-CM | POA: Insufficient documentation

## 2022-06-12 DIAGNOSIS — Z08 Encounter for follow-up examination after completed treatment for malignant neoplasm: Secondary | ICD-10-CM | POA: Insufficient documentation

## 2022-06-12 DIAGNOSIS — C50919 Malignant neoplasm of unspecified site of unspecified female breast: Secondary | ICD-10-CM

## 2022-06-12 DIAGNOSIS — Z859 Personal history of malignant neoplasm, unspecified: Secondary | ICD-10-CM | POA: Insufficient documentation

## 2022-06-12 DIAGNOSIS — Z79810 Long term (current) use of selective estrogen receptor modulators (SERMs): Secondary | ICD-10-CM | POA: Insufficient documentation

## 2022-06-12 DIAGNOSIS — Z9013 Acquired absence of bilateral breasts and nipples: Secondary | ICD-10-CM | POA: Insufficient documentation

## 2022-06-12 LAB — COMPREHENSIVE METABOLIC PANEL, NON-FASTING
ALBUMIN/GLOBULIN RATIO: 1.2 (ref 0.8–1.4)
ALBUMIN: 4.4 g/dL (ref 3.5–5.7)
ALKALINE PHOSPHATASE: 157 U/L — ABNORMAL HIGH (ref 34–104)
ALT (SGPT): 22 U/L (ref 7–52)
ANION GAP: 8 mmol/L (ref 4–13)
AST (SGOT): 25 U/L (ref 13–39)
BILIRUBIN TOTAL: 0.4 mg/dL (ref 0.3–1.2)
BUN/CREA RATIO: 24 — ABNORMAL HIGH (ref 6–22)
BUN: 12 mg/dL (ref 7–25)
CALCIUM, CORRECTED: 9.2 mg/dL (ref 8.9–10.8)
CALCIUM: 9.6 mg/dL (ref 8.6–10.3)
CHLORIDE: 103 mmol/L (ref 98–107)
CO2 TOTAL: 28 mmol/L (ref 21–31)
CREATININE: 0.5 mg/dL — ABNORMAL LOW (ref 0.60–1.30)
ESTIMATED GFR: 111 mL/min/{1.73_m2} (ref >59)
GLOBULIN: 3.7 (ref 2.9–5.4)
GLUCOSE: 126 mg/dL — ABNORMAL HIGH (ref 74–109)
OSMOLALITY, CALCULATED: 279 mOsm/kg (ref 270–290)
POTASSIUM: 4 mmol/L (ref 3.5–5.1)
PROTEIN TOTAL: 8.1 g/dL (ref 6.4–8.9)
SODIUM: 139 mmol/L (ref 136–145)

## 2022-06-12 LAB — CBC
HCT: 45.2 % (ref 37.0–47.0)
HGB: 15.7 g/dL (ref 12.5–16.0)
MCH: 31.4 pg (ref 27.0–32.0)
MCHC: 34.8 g/dL (ref 32.0–36.0)
MCV: 90.2 fL (ref 78.0–99.0)
MPV: 7.2 fL — ABNORMAL LOW (ref 7.4–10.4)
PLATELETS: 297 10*3/uL (ref 140–440)
RBC: 5.01 10*6/uL (ref 4.20–5.40)
RDW: 13.9 % (ref 11.6–14.8)
WBC: 11.2 10*3/uL — ABNORMAL HIGH (ref 4.0–10.5)
WBCS UNCORRECTED: 11.2 10*3/uL

## 2022-06-12 NOTE — Progress Notes (Signed)
Department of Hematology/Oncology  History and Physical    Name: Gail Morris  SHU:[id number]  Date of Birth: 03-04-1966  Encounter Date: 06/12/2022    REFERRING PROVIDER:  Peri Maris, DO  [address]  [place],  [place] [address]    TELEMEDICINE DOCUMENTATION:  Patient Location:  [facility], [facility] outpatient Hematology/Oncology [address], [place] [address]  Patient/family aware of provider location:  yes  Patient/family consent for telemedicine:  yes    REASON FOR OFFICE VISIT:  New patient for evaluation and management of breast cancer    HISTORY OF PRESENT ILLNESS:  Gail Morris is a 56 y.o. female who presents today for management of breast cancer.     Her first episode of breast cancer was around 2009, and was treated with surgery, chemo, radiation, and hormone blockade. She was reportedly on Femara and leuprolide at the time that she developed a recurrence. She states that the recurrence was almost 5 years prior to her initial consultation with me (August 2018) and she is being treated with tamoxifen adjuvantly. 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 She was previously BRCA tested and found to be negative.     She states that she has been having more problems with back pain recently, and is requesting imaging to evaluate for cancer recurrence. She has periodically had Ca 15-3 levels checked, and they have generally been in normal range.     ROS:   Review of Systems   Constitutional:  Negative for appetite change, chills and fatigue.   HENT:   Negative for sore throat and trouble swallowing.    Eyes:  Negative for eye problems.   Respiratory:  Negative for cough and shortness of breath.    Cardiovascular:  Negative for chest pain and leg swelling.   Gastrointestinal:  Negative for abdominal pain.   Genitourinary:  Negative for dysuria and hematuria.    Musculoskeletal:  Negative for arthralgias and  gait problem.   Skin:  Negative for rash.   Neurological:  Negative for gait problem.   Hematological:  Negative for adenopathy.   Psychiatric/Behavioral:  Negative for depression.       HISTORY:  Past Medical History:   Diagnosis Date    Anxiety     Breast cancer (CMS HCC)     Cancer (CMS HCC)     Depression     Diabetes mellitus, type 2 (CMS HCC)     Esophageal reflux     H/O blood clots     Hiccups     HTN (hypertension)     Migraine     Neuropathy (CMS HCC)     Primary generalized (osteo)arthritis          Past Surgical History:   Procedure Laterality Date    HAND SURGERY      HX BACK SURGERY      HX BREAST RECONSTRUCTION      HX CESAREAN SECTION      HX CHOLECYSTECTOMY      SHOULDER ADHESION RELEASE Left     SIMPLE MASTECTOMY Bilateral          Social History     Socioeconomic History    Marital status: Divorced     Spouse name: Not on file    Number of children: Not on file    Years of education: Not on file    Highest education level: Not  on file   Occupational History    Not on file   Tobacco Use    Smoking status: Every Day     Packs/day: 1.00     Types: Cigarettes    Smokeless tobacco: Never   Vaping Use    Vaping Use: Never used   Substance and Sexual Activity    Alcohol use: Never    Drug use: Yes     Types: Marijuana     Comment: USED AROUND 3 DAYS AGO    Sexual activity: Not on file   Other Topics Concern    Not on file   Social History Narrative    Not on file     Social Determinants of Health     Financial Resource Strain: Not on file   Transportation Needs: Not on file   Social Connections: Not on file   Intimate Partner Violence: Not on file   Housing Stability: Not on file     Family Medical History:    None         Current Outpatient Medications   Medication Sig    BD INSULIN SYRINGE ULTRA-FINE 1 mL 31 gauge x 5/16 Syringe USE TO INJECT INSULIN AS DIRECTED    busPIRone (BUSPAR) 5 mg Oral Tablet Take 1 Tablet (5 mg total) by mouth Once a day    cyclobenzaprine (FLEXERIL) 10 mg Oral Tablet Take 1  Tablet (10 mg total) by mouth Three times a day as needed for Muscle spasms    docusate sodium (COLACE) 100 mg Oral Capsule Take 1 Capsule (100 mg total) by mouth Twice per day as needed    empagliflozin (JARDIANCE) 25 mg Oral Tablet Take 1 Tablet (25 mg total) by mouth Once a day    fluticasone propionate (FLONASE) 50 mcg/actuation Nasal Spray, Suspension Administer 1-2 Sprays into each nostril Once per day as needed    gabapentin (NEURONTIN) 100 mg Oral Capsule 1 Capsule (100 mg total) Three times a day    gabapentin (NEURONTIN) 800 mg Oral Tablet Take 1 Tablet (800 mg total) by mouth Three times a day    insulin glargine 100 unit/mL Subcutaneous injection (vial) Inject 40 Units under the skin Every night    ipratropium-albuterol 0.5 mg-3 mg(2.5 mg base)/3 mL Solution for Nebulization Take 3 mL by nebulization Twice per day as needed    ketorolac tromethamine (TORADOL) 10 mg Oral Tablet Take 1 Tablet (10 mg total) by mouth Every 6 hours as needed for Pain    ondansetron (ZOFRAN ODT) 4 mg Oral Tablet, Rapid Dissolve Place 1 Tablet (4 mg total) under the tongue Every 6 hours as needed    ramipriL (ALTACE) 10 mg Oral Capsule Take 1 Capsule (10 mg total) by mouth Once a day    VENTOLIN HFA 90 mcg/actuation Inhalation oral inhaler Take 2 Puffs by inhalation Every 4 hours as needed     Allergies   Allergen Reactions    Metformin  Other Adverse Reaction (Add comment)     "Dehydrates me"    Tramadol  Other Adverse Reaction (Add comment)     Tachycardia      Trulicity [Dulaglutide]  Other Adverse Reaction (Add comment)     Loss of appetite       PHYSICAL EXAM:  Most Recent Vitals    Flowsheet Row Telemedicine from 06/12/2022 in Hematology/Oncology,   [facility]   Temperature 36.2 C (97.2 F) filed at... 06/12/2022 1434   Heart Rate 84 filed at... 06/12/2022 1434  Respiratory Rate --   BP (Non-Invasive) 118/72 filed at... 06/12/2022 1434   SpO2 --   Height 1.575 m ('5\' 2"'$ ) filed at... 06/12/2022 1434    Weight 52.3 kg (115 lb 3.2 oz) filed at... 06/12/2022 1434   BMI (Calculated) 21.11 filed at... 06/12/2022 1434   BSA (Calculated) 1.51 filed at... 06/12/2022 1434      ECOG Status: (1) Restricted in physically strenuous activity, ambulatory and able to do work of light nature   Physical Exam    DIAGNOSTIC DATA:  No results found for this or any previous visit (from the past 17520 hour(s)).    LABS:   CBC  Diff   Lab Results   Component Value Date/Time    WBC 9.9 04/03/2022 02:09 PM    HGB 14.8 04/03/2022 02:09 PM    HCT 43.3 04/03/2022 02:09 PM    PLTCNT 263 04/03/2022 02:09 PM    RBC 4.80 04/03/2022 02:09 PM    MCV 90.3 04/03/2022 02:09 PM    MCHC 34.3 04/03/2022 02:09 PM    MCH 31.0 04/03/2022 02:09 PM    RDW 13.7 04/03/2022 02:09 PM    MPV 7.7 04/03/2022 02:09 PM    Lab Results   Component Value Date/Time    PMNS 56 04/03/2022 02:09 PM    LYMPHOCYTES 34 04/03/2022 02:09 PM    EOSINOPHIL 2 04/03/2022 02:09 PM    MONOCYTES 7 04/03/2022 02:09 PM    BASOPHILS 1 04/03/2022 02:09 PM    BASOPHILS 0.10 04/03/2022 02:09 PM    PMNABS 5.50 04/03/2022 02:09 PM    LYMPHSABS 3.40 04/03/2022 02:09 PM    EOSABS 0.20 04/03/2022 02:09 PM    MONOSABS 0.70 04/03/2022 02:09 PM            ASSESSMENT:  No diagnosis found.     PLAN:   1. All relevant medical records were reviewed including available pertinent provider notes, procedure notes, imaging, laboratory, and pathology.   2. All pertinent labs and/or imaging were reviewed with the patient.   3. Breast cancer:  The patient is status post prior definitive therapy x2.  Although I would like to have additional records, she indicates that she is about 5 years out from the most recent recurrence and is almost done taking 5 years of adjuvant tamoxifen.  I will go ahead and make arrangements to recheck a Ca 27-29 at this time, and we will see about a PET-CT scan for restaging purposes and to evaluate the back pain which she is mentioning at this time.  We discussed that if everything  looks good, then we will likely switch to annual follow up.    Japneet L Denn was given the chance to ask questions, and these were answered to their satisfaction. The patient is welcome to call with any questions or concerns in the meantime.     On the day of the encounter, a total of 45 minutes was spent on this patient encounter including review of historical information, examination, documentation and post-visit activities.   No follow-ups on file.   Narda Rutherford, MD  06/12/2022, 15:15  The patient was seen as part of a collaborative telemedicine service with Dr. Jake Shark who participated in the encounter by active presence via approved video/audio means for portions of the encounter.  The patient's insurance company bears full legal and financial responsibility resulting from any deviations that they cause to my recommended treatment plan.     CC:  Peri Maris, DO  [place]  [place] [address]    Peri Maris, [address]  [place],  [place] [address]    This note was partially generated using MModal Fluency Direct system, and there may be some incorrect words, spellings, and punctuation that were not noted in checking the note before saving.

## 2022-06-16 LAB — CA 27,29: CA 27.29: 48 U/mL — ABNORMAL HIGH (ref <38)

## 2022-06-20 ENCOUNTER — Ambulatory Visit (HOSPITAL_COMMUNITY): Payer: Self-pay | Admitting: HEMATOLOGY-ONCOLOGY

## 2022-06-27 ENCOUNTER — Other Ambulatory Visit: Payer: Self-pay

## 2022-06-27 ENCOUNTER — Inpatient Hospital Stay
Admission: RE | Admit: 2022-06-27 | Discharge: 2022-06-27 | Disposition: A | Discharge status: Home / Self Care | Payer: MEDICAID | Source: Ambulatory Visit | Attending: HEMATOLOGY-ONCOLOGY | Admitting: HEMATOLOGY-ONCOLOGY

## 2022-06-27 DIAGNOSIS — C50919 Malignant neoplasm of unspecified site of unspecified female breast: Secondary | ICD-10-CM

## 2022-07-01 ENCOUNTER — Ambulatory Visit (HOSPITAL_COMMUNITY): Payer: Self-pay | Admitting: HEMATOLOGY-ONCOLOGY

## 2022-08-19 ENCOUNTER — Other Ambulatory Visit (HOSPITAL_COMMUNITY): Payer: Self-pay | Admitting: Family Medicine

## 2022-08-19 DIAGNOSIS — Z780 Asymptomatic menopausal state: Secondary | ICD-10-CM

## 2022-08-23 ENCOUNTER — Other Ambulatory Visit (HOSPITAL_COMMUNITY): Payer: Self-pay | Admitting: Family Medicine

## 2022-08-23 DIAGNOSIS — Z853 Personal history of malignant neoplasm of breast: Secondary | ICD-10-CM

## 2022-08-29 ENCOUNTER — Other Ambulatory Visit: Payer: Self-pay

## 2022-08-29 ENCOUNTER — Inpatient Hospital Stay
Admission: RE | Admit: 2022-08-29 | Discharge: 2022-08-29 | Disposition: A | Discharge status: Home / Self Care | Payer: MEDICAID | Source: Ambulatory Visit | Attending: Family Medicine | Admitting: Family Medicine

## 2022-08-29 DIAGNOSIS — Z780 Asymptomatic menopausal state: Secondary | ICD-10-CM | POA: Insufficient documentation

## 2022-08-29 DIAGNOSIS — M810 Age-related osteoporosis without current pathological fracture: Secondary | ICD-10-CM

## 2022-08-31 ENCOUNTER — Emergency Department (HOSPITAL_BASED_OUTPATIENT_CLINIC_OR_DEPARTMENT_OTHER): Payer: MEDICAID

## 2022-08-31 ENCOUNTER — Emergency Department
Admission: EM | Admit: 2022-08-31 | Discharge: 2022-08-31 | Disposition: A | Discharge status: Home / Self Care | Payer: MEDICAID | Attending: Family | Admitting: Family

## 2022-08-31 ENCOUNTER — Encounter (HOSPITAL_BASED_OUTPATIENT_CLINIC_OR_DEPARTMENT_OTHER): Payer: Self-pay

## 2022-08-31 ENCOUNTER — Other Ambulatory Visit: Payer: Self-pay

## 2022-08-31 DIAGNOSIS — F17210 Nicotine dependence, cigarettes, uncomplicated: Secondary | ICD-10-CM | POA: Insufficient documentation

## 2022-08-31 DIAGNOSIS — U071 COVID-19: Secondary | ICD-10-CM | POA: Insufficient documentation

## 2022-08-31 HISTORY — DX: Other specified postprocedural states: Z98.890

## 2022-08-31 LAB — COVID-19, FLU A/B, RSV RAPID BY PCR
INFLUENZA VIRUS TYPE A: NOT DETECTED
INFLUENZA VIRUS TYPE B: NOT DETECTED
RESPIRATORY SYNCTIAL VIRUS (RSV): NOT DETECTED
SARS-CoV-2: DETECTED — AB

## 2022-08-31 MED ORDER — GUAIFENESIN 100 MG/5 ML ORAL LIQUID
200.0000 mg | ORAL | Status: AC
Start: 2022-08-31 — End: 2022-08-31
  Administered 2022-08-31: 200 mg via ORAL

## 2022-08-31 MED ORDER — BENZONATATE 100 MG CAPSULE
100.0000 mg | ORAL_CAPSULE | Freq: Three times a day (TID) | ORAL | 0 refills | Status: DC | PRN
Start: 2022-08-31 — End: 2023-12-09

## 2022-08-31 MED ORDER — GUAIFENESIN ER 600 MG TABLET, EXTENDED RELEASE 12 HR
600.0000 mg | EXTENDED_RELEASE_TABLET | Freq: Two times a day (BID) | ORAL | Status: DC
Start: 2022-08-31 — End: 2023-12-09

## 2022-08-31 MED ORDER — IPRATROPIUM 0.5 MG-ALBUTEROL 3 MG (2.5 MG BASE)/3 ML NEBULIZATION SOLN
INHALATION_SOLUTION | RESPIRATORY_TRACT | Status: AC
Start: 2022-08-31 — End: 2022-08-31
  Filled 2022-08-31: qty 3

## 2022-08-31 MED ORDER — IPRATROPIUM 0.5 MG-ALBUTEROL 3 MG (2.5 MG BASE)/3 ML NEBULIZATION SOLN
3.0000 mL | INHALATION_SOLUTION | RESPIRATORY_TRACT | Status: AC
Start: 2022-08-31 — End: 2022-08-31
  Administered 2022-08-31: 3 mL via RESPIRATORY_TRACT

## 2022-08-31 MED ORDER — GUAIFENESIN 100 MG/5 ML ORAL LIQUID
ORAL | Status: AC
Start: 2022-08-31 — End: 2022-08-31
  Filled 2022-08-31: qty 10

## 2022-08-31 NOTE — ED Provider Notes (Signed)
[facility], [facility] Emergency Department  ED Primary Provider Note  History of Present Illness   Chief Complaint   Patient presents with    Flu Like Symptoms            Gail Morris is a 56 y.o. female who had concerns including Flu Like Symptoms.  Arrival: The patient arrived by Car    Patient 57 female to the emergency department complaining of cough, congestion rhinorrhea ongoing since last night.  Patient states she is been around other with similar symptoms at home.  Patient denies any history of COPD, asthma but does state she smokes pack per day.  Patient states she has had a slight nonproductive cough but denies any shortness of breath or chest pain.  Patient denies any fevers at home otherwise and denies taking medicine other than Motrin.      History Reviewed This Encounter: Medical History  Surgical History  Family History  Social History    Physical Exam   ED Triage Vitals [08/31/22 1609]   BP (Non-Invasive) 125/82   Heart Rate 81   Respiratory Rate 18   Temperature 37.1 C (98.8 F)   SpO2 100 %   Weight 49 kg (108 lb)   Height 1.575 m ('5\' 2"'$ )     Physical Exam  Vitals and nursing note reviewed.   Constitutional:       General: She is not in acute distress.     Appearance: She is well-developed.   HENT:      Head: Normocephalic and atraumatic.   Eyes:      Conjunctiva/sclera: Conjunctivae normal.   Cardiovascular:      Rate and Rhythm: Normal rate and regular rhythm.      Heart sounds: No murmur heard.  Pulmonary:      Effort: Pulmonary effort is normal. No respiratory distress.      Breath sounds: Normal breath sounds.   Abdominal:      Palpations: Abdomen is soft.      Tenderness: There is no abdominal tenderness.   Musculoskeletal:         General: No swelling.      Cervical back: Neck supple.   Skin:     General: Skin is warm and dry.      Capillary Refill: Capillary refill takes less than 2 seconds.   Neurological:      Mental Status: She is alert.    Psychiatric:         Mood and Affect: Mood normal.       Patient Data     Labs Ordered/Reviewed   COVID-19, FLU A/B, RSV RAPID BY PCR - Abnormal; Notable for the following components:       Result Value    SARS-CoV-2 Detected (*)     All other components within normal limits    Narrative:     Results are for the simultaneous qualitative identification of SARS-CoV-2 (formerly 2019-nCoV), Influenza A, Influenza B, and RSV RNA. These etiologic agents are generally detectable in nasopharyngeal and nasal swabs during the ACUTE PHASE of infection. Hence, this test is intended to be performed on respiratory specimens collected from individuals with signs and symptoms of upper respiratory tract infection who meet Centers for Disease Control and Prevention (CDC) clinical and/or epidemiological criteria for Coronavirus Disease 2019 (COVID-19) testing. CDC COVID-19 criteria for testing on human specimens is available at [facility] webpage information for Healthcare Professionals: Coronavirus Disease 2019 (COVID-19) ([employer]).     False-negative  results may occur if the virus has genomic mutations, insertions, deletions, or rearrangements or if performed very early in the course of illness. Otherwise, negative results indicate virus specific RNA targets are not detected, however negative results do not preclude SARS-CoV-2 infection/COVID-19, Influenza, or Respiratory syncytial virus infection. Results should not be used as the sole basis for patient management decisions. Negative results must be combined with clinical observations, patient history, and epidemiological information. If upper respiratory tract infection is still suspected based on exposure history together with other clinical findings, re-testing should be considered.    Disclaimer:   This assay has been authorized by FDA under an Emergency Use Authorization for use in laboratories certified under the Clinical  Laboratory Improvement Amendments of 1988 (CLIA), 42 U.S.C. 573 150 4374, to perform high complexity tests. The impacts of vaccines, antiviral therapeutics, antibiotics, chemotherapeutic or immunosuppressant drugs have not been evaluated.     Test methodology:   Cepheid Xpert Xpress SARS-CoV-2/Flu/RSV Assay real-time polymerase chain reaction (RT-PCR) test on the GeneXpert Dx and Xpert Xpress systems.     XR CHEST AP   Final Result by Edi, Rad[facility] In (11/18 1650)   NO ACUTE FINDINGS.         Radiologist location ID: [facility]        Medical Decision Making  Patient 35 female to the emergency department complaining of cough, congestion rhinorrhea ongoing since last night.  Patient states she is been around other with similar symptoms at home.  Patient denies any history of COPD, asthma but does state she smokes pack per day.  Patient states she has had a slight nonproductive cough but denies any shortness of breath or chest pain.  Patient denies any fevers at home otherwise and denies taking medicine other than Motrin.  Differential diagnosis include but are not limited to COVID, flu, RSV, pneumonia.  On physical examination mild wheezing in bilateral upper lobes.  Albuterol given as as Mucinex.  On re-evaluation patient states she is feels slightly better.  COVID returns positive with negative flu and RSV and chest x-ray within normal limits.  Patient discharged to use test on guaifenesin for cough and to quarantine per CDC line lines.  Patient return to ED if any further shortness of breath or worsening of symptoms otherwise follow up with PCP.    Amount and/or Complexity of Data Reviewed  Radiology: ordered.    Risk  OTC drugs.  Prescription drug management.             Medications Administered in the ED   guaiFENesin '100mg'$  per 64m oral liquid - for cough (expectorant) (200 mg Oral Given 08/31/22 1630)   ipratropium-albuterol 0.5 mg-3 mg(2.5 mg base)/3 mL Solution for Nebulization  (3 mL Nebulization Given 08/31/22 1632)     Clinical Impression   COVID (Primary)       Disposition: Discharged

## 2022-08-31 NOTE — ED Triage Notes (Signed)
Patient reports ears hurting, headache, not sleeping, and running a fever since yesterday. Reports highest temperature was 101.

## 2022-09-02 ENCOUNTER — Telehealth (HOSPITAL_BASED_OUTPATIENT_CLINIC_OR_DEPARTMENT_OTHER): Payer: Self-pay

## 2022-09-02 NOTE — ED Nurses Note (Signed)
Follow-up 1: Left Message requesting call back.

## 2022-09-03 ENCOUNTER — Telehealth (HOSPITAL_BASED_OUTPATIENT_CLINIC_OR_DEPARTMENT_OTHER): Payer: Self-pay

## 2022-09-03 NOTE — ED Nurses Note (Signed)
Patient states that she uses Marijuana for pain management and does not feel that she over uses.   Initial BI and documentation completed.

## 2022-09-03 NOTE — ED Nurses Note (Signed)
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    Prior treatment history?: No    Currently enrolled in substance use program?: No    Within the last 30 days, what substances has the patient used?: Marijuana  Patient's age at first substance use?: 15-20  Drug route of administration: Smoked    Has the patient ever had sustained abstinence?: No              Family, Social, Home & Safety History  Marital Status: Divorced            Need to improve relationships with family?: No    Social network: Immediate family, Substance using peers, Non-substance using peers/friends/other    Current living situation: Independent  Any help needed with the following?: None  Contact phone number for the patient: [phone]       Has the patient had any legal issues within the past 30 days?: None         Employment  Current employment status: Disabled    Needs vocational training?: No  Needs assistance with job search?: No    Engagement  Readiness ruler: 1  Summary of assessment priority areas: comments: Patient does not feel that there is an issue with her use and why she uses.    Brief Intervention  Discussed plan to reduce/quit substance use?: Yes  Discussed willingness to enter treatment?: Yes  Indicated patient's stage of change:: 1 - Precontemplation (Patient is not willing to make a change because Marijuana "helps" her pain.)    Patient seen by Peer Recovery Coach and is a candidate for buprenorphine administration in the ED. Patient needs assessment for bup treatment.: No    Plan  Was the patient  referred to treatment?: No    Was patient referred to physician for Buprenorphine Assessment in the ED?: No    Did patient receive Narcan in the ED?: No (Non-Opoid.)         Follow-up  Patient admitted for treatment?: No        Need for additional follow-up?: No       Rossie Muskrat, Peer Recovery Coach 09/03/2022 11:23

## 2022-09-08 ENCOUNTER — Ambulatory Visit (HOSPITAL_COMMUNITY): Payer: Self-pay

## 2022-11-11 ENCOUNTER — Emergency Department
Admission: EM | Admit: 2022-11-11 | Discharge: 2022-11-11 | Discharge status: Against Medical Advice | Payer: MEDICAID | Attending: Family Medicine | Admitting: Family Medicine

## 2022-11-11 ENCOUNTER — Emergency Department (HOSPITAL_COMMUNITY): Payer: MEDICAID

## 2022-11-11 ENCOUNTER — Other Ambulatory Visit: Payer: Self-pay

## 2022-11-11 ENCOUNTER — Encounter (HOSPITAL_COMMUNITY): Payer: Self-pay

## 2022-11-11 DIAGNOSIS — R519 Headache, unspecified: Secondary | ICD-10-CM | POA: Insufficient documentation

## 2022-11-11 DIAGNOSIS — H40212 Acute angle-closure glaucoma, left eye: Secondary | ICD-10-CM | POA: Insufficient documentation

## 2022-11-11 DIAGNOSIS — Z5329 Procedure and treatment not carried out because of patient's decision for other reasons: Secondary | ICD-10-CM | POA: Insufficient documentation

## 2022-11-11 DIAGNOSIS — I10 Essential (primary) hypertension: Secondary | ICD-10-CM | POA: Insufficient documentation

## 2022-11-11 HISTORY — DX: Aneurysm of unspecified site: I72.9

## 2022-11-11 LAB — URINALYSIS, MACROSCOPIC
BILIRUBIN: NEGATIVE mg/dL
BLOOD: NEGATIVE mg/dL
GLUCOSE: >1000 mg/dL — AB
KETONES: 60 mg/dL — AB
LEUKOCYTES: NEGATIVE WBCs/uL
NITRITE: NEGATIVE
PH: 7 (ref 5.0–9.0)
PROTEIN: NEGATIVE mg/dL
SPECIFIC GRAVITY: 1.046 — ABNORMAL HIGH (ref 1.002–1.030)
UROBILINOGEN: NORMAL mg/dL

## 2022-11-11 LAB — MANUAL DIFFERENTIAL
BASOPHIL %: 1 % (ref 0–3)
BASOPHIL ABSOLUTE: 0.09 10*3/uL (ref 0.00–0.30)
BASOPHILS MANUAL: 1
EOSINOPHIL %: 2 % (ref 0–7)
EOSINOPHIL ABSOLUTE: 0.19 10*3/uL (ref 0.00–0.80)
EOSINOPHILS MANUAL: 2
LYMPHOCYTE %: 29 % (ref 25–45)
LYMPHOCYTE ABSOLUTE: 2.7 10*3/uL (ref 1.10–5.00)
LYMPHOCYTES MANUAL: 29
MONOCYTE %: 8 % (ref 0–12)
MONOCYTE ABSOLUTE: 0.74 10*3/uL (ref 0.00–1.30)
MONOCYTES MANUAL: 8
NEUTROPHIL %: 60 % (ref 40–76)
NEUTROPHIL ABSOLUTE: 5.58 10*3/uL (ref 1.80–8.40)
NEUTROPHILS MANUAL: 60
PLATELET MORPHOLOGY COMMENT: NORMAL
TOTAL CELLS COUNTED [#] IN BLOOD: 100
WBC: 9.3 10*3/uL

## 2022-11-11 LAB — URINALYSIS, MICROSCOPIC
RBCS: 5 /hpf — ABNORMAL HIGH (ref <4)
SQUAMOUS EPITHELIAL: 6 /hpf (ref <28)
WBCS: 4 /hpf (ref <6)

## 2022-11-11 LAB — DRUG SCREEN, NO CONFIRMATION, URINE
AMPHET QL: NEGATIVE
BARB QL: NEGATIVE
BENZO QL: NEGATIVE
BUP QL: NEGATIVE
CANNAQL: POSITIVE — AB
COCQL: NEGATIVE
FENTANYL, RANDOM URINE: NEGATIVE
METHQL: NEGATIVE
OPIATE: NEGATIVE
OXYCODONE URINE: NEGATIVE
PCP QL: NEGATIVE

## 2022-11-11 LAB — PTT (PARTIAL THROMBOPLASTIN TIME): APTT: 29.4 seconds (ref 25.0–38.0)

## 2022-11-11 LAB — TROPONIN-I: TROPONIN I: 7 ng/L (ref <15)

## 2022-11-11 LAB — COMPREHENSIVE METABOLIC PANEL, NON-FASTING
ALBUMIN/GLOBULIN RATIO: 1.1 (ref 0.8–1.4)
ALBUMIN: 4.2 g/dL (ref 3.5–5.7)
ALKALINE PHOSPHATASE: 180 U/L — ABNORMAL HIGH (ref 34–104)
ALT (SGPT): 22 U/L (ref 7–52)
ANION GAP: 9 mmol/L (ref 4–13)
AST (SGOT): 34 U/L (ref 13–39)
BILIRUBIN TOTAL: 0.7 mg/dL (ref 0.3–1.2)
BUN/CREA RATIO: 24 — ABNORMAL HIGH (ref 6–22)
BUN: 10 mg/dL (ref 7–25)
CALCIUM, CORRECTED: 9.5 mg/dL (ref 8.9–10.8)
CALCIUM: 9.7 mg/dL (ref 8.6–10.3)
CHLORIDE: 99 mmol/L (ref 98–107)
CO2 TOTAL: 23 mmol/L (ref 21–31)
CREATININE: 0.42 mg/dL — ABNORMAL LOW (ref 0.60–1.30)
ESTIMATED GFR: 116 mL/min/{1.73_m2} (ref >59)
GLOBULIN: 3.9 (ref 2.9–5.4)
GLUCOSE: 387 mg/dL — ABNORMAL HIGH (ref 74–109)
OSMOLALITY, CALCULATED: 278 mOsm/kg (ref 270–290)
POTASSIUM: 5.1 mmol/L (ref 3.5–5.1)
PROTEIN TOTAL: 8.1 g/dL (ref 6.4–8.9)
SODIUM: 131 mmol/L — ABNORMAL LOW (ref 136–145)

## 2022-11-11 LAB — PT/INR
INR: 1.03 (ref 0.84–1.10)
PROTHROMBIN TIME: 11.9 seconds (ref 9.8–12.7)

## 2022-11-11 LAB — CBC WITH DIFF
HCT: 44.5 % — ABNORMAL HIGH (ref 31.2–41.9)
HGB: 15.1 g/dL — ABNORMAL HIGH (ref 10.9–14.3)
MCH: 30.4 pg (ref 24.7–32.8)
MCHC: 33.9 g/dL (ref 32.3–35.6)
MCV: 89.5 fL (ref 75.5–95.3)
MPV: 9 fL (ref 7.9–10.8)
PLATELETS: 250 10*3/uL (ref 140–440)
RBC: 4.97 10*6/uL — ABNORMAL HIGH (ref 3.63–4.92)
RDW: 13.8 % (ref 12.3–17.7)
WBC: 9.3 10*3/uL (ref 3.8–11.8)

## 2022-11-11 LAB — C-REACTIVE PROTEIN (CRP): C-REACTIVE PROTEIN (CRP): 0.3 mg/dL (ref 0.1–0.5)

## 2022-11-11 MED ORDER — INSULIN GLARGINE 100 UNITS/ML SUBQ - CHARGE BY DOSE
SUBCUTANEOUS | Status: AC
Start: 2022-11-11 — End: 2022-11-11
  Filled 2022-11-11: qty 40

## 2022-11-11 MED ORDER — TIMOLOL MALEATE 0.5 % EYE DROPS
1.0000 [drp] | OPHTHALMIC | Status: AC
Start: 2022-11-11 — End: 2022-11-11
  Administered 2022-11-11: 1 [drp] via OPHTHALMIC

## 2022-11-11 MED ORDER — BALANCED SALT SOLUTION COMBINATION NO.2 INTRAOCULAR IRRIGATION
INTRAOCULAR | Status: AC
Start: 2022-11-11 — End: 2022-11-11
  Filled 2022-11-11: qty 15

## 2022-11-11 MED ORDER — INSULIN GLARGINE 100 UNITS/ML SUBQ - CHARGE BY DOSE
40.0000 [IU] | SUBCUTANEOUS | Status: AC
Start: 2022-11-11 — End: 2022-11-11
  Administered 2022-11-11: 40 [IU] via SUBCUTANEOUS

## 2022-11-11 MED ORDER — BRIMONIDINE 0.2 % EYE DROPS
1.0000 [drp] | OPHTHALMIC | Status: AC
Start: 2022-11-11 — End: 2022-11-11
  Administered 2022-11-11: 1 [drp] via OPHTHALMIC

## 2022-11-11 MED ORDER — MORPHINE 4 MG/ML INJECTION WRAPPER
4.0000 mg | INJECTION | INTRAMUSCULAR | Status: DC
Start: 2022-11-11 — End: 2022-11-11
  Administered 2022-11-11: 0 mg via INTRAVENOUS

## 2022-11-11 MED ORDER — DORZOLAMIDE 2 % EYE DROPS
1.0000 [drp] | OPHTHALMIC | Status: AC
Start: 2022-11-11 — End: 2022-11-11
  Administered 2022-11-11: 1 [drp] via OPHTHALMIC
  Filled 2022-11-11: qty 10

## 2022-11-11 MED ORDER — BRIMONIDINE 0.2 % EYE DROPS
1.0000 [drp] | OPHTHALMIC | Status: AC
Start: 2022-11-11 — End: 2022-11-11
  Administered 2022-11-11: 1 [drp] via OPHTHALMIC
  Filled 2022-11-11: qty 5

## 2022-11-11 MED ORDER — TIMOLOL MALEATE 0.5 % EYE DROPS
1.0000 [drp] | Freq: Once | OPHTHALMIC | Status: AC
Start: 2022-11-11 — End: 2022-11-11
  Administered 2022-11-11: 1 [drp] via OPHTHALMIC

## 2022-11-11 MED ORDER — TETRACAINE 0.5 % EYE DROPS
OPHTHALMIC | Status: AC
Start: 2022-11-11 — End: 2022-11-11
  Filled 2022-11-11: qty 100

## 2022-11-11 MED ORDER — ACETAZOLAMIDE 500 MG SOLUTION FOR INJECTION
500.0000 mg | INTRAMUSCULAR | Status: AC
Start: 2022-11-11 — End: 2022-11-11
  Administered 2022-11-11: 500 mg via INTRAVENOUS
  Filled 2022-11-11: qty 5

## 2022-11-11 MED ORDER — FLUORESCEIN 1 MG EYE STRIPS
ORAL_STRIP | OPHTHALMIC | Status: AC
Start: 2022-11-11 — End: 2022-11-11
  Filled 2022-11-11: qty 1

## 2022-11-11 MED ORDER — ONDANSETRON HCL (PF) 4 MG/2 ML INJECTION SOLUTION
4.0000 mg | INTRAMUSCULAR | Status: DC
Start: 2022-11-11 — End: 2022-11-11
  Administered 2022-11-11: 0 mg via INTRAVENOUS

## 2022-11-11 MED ORDER — DORZOLAMIDE 2 % EYE DROPS
1.0000 [drp] | OPHTHALMIC | Status: AC
Start: 2022-11-11 — End: 2022-11-11
  Administered 2022-11-11: 1 [drp] via OPHTHALMIC

## 2022-11-11 MED ORDER — TETRACAINE 0.5 % EYE DROPS
1.0000 [drp] | OPHTHALMIC | Status: AC
Start: 2022-11-11 — End: 2022-11-11
  Administered 2022-11-11: 1 [drp] via OPHTHALMIC

## 2022-11-11 MED ORDER — DEXAMETHASONE SODIUM PHOSPHATE (PF) 10 MG/ML INJECTION SOLUTION
10.0000 mg | INTRAMUSCULAR | Status: AC
Start: 2022-11-11 — End: 2022-11-11
  Administered 2022-11-11: 10 mg via INTRAVENOUS

## 2022-11-11 MED ORDER — DEXAMETHASONE SODIUM PHOSPHATE (PF) 10 MG/ML INJECTION SOLUTION
INTRAMUSCULAR | Status: AC
Start: 2022-11-11 — End: 2022-11-11
  Filled 2022-11-11: qty 1

## 2022-11-11 MED ORDER — TIMOLOL MALEATE 0.5 % EYE DROPS
OPHTHALMIC | Status: AC
Start: 2022-11-11 — End: 2022-11-11
  Filled 2022-11-11: qty 5

## 2022-11-11 NOTE — ED Provider Notes (Addendum)
[facility]  ED Primary Provider Note  Patient Name: Gail Morris  Patient Age: 57 y.o.  Date of Birth: 1965/11/14    Chief Complaint: Headache        History of Present Illness       Gail Morris is a 57 y.o. female who had concerns including Headache.  PATIENT PRESENTED TO THE EMERGENCY DEPARTMENT WITH COMPLAINTS OF LEFT-SIDED HEADACHE AND LEFT-SIDED EYE PAIN WITH BLURRY VISION THAT STARTED LAST NIGHT.  PATIENT REPORTS DISCOMFORT IN THE LEFT EYE THAT WORSENS WITH BLINKING.  SHE STATES THAT SHE FEELS AS THOUGH SOMETHING IS SCRATCHING HER EYE.  SHE COMPLAINS OF PAIN IN THE EYE, AS WELL AS LEFT-SIDED HEADACHE.  SHE DOES ADMIT TO BLURRY VISION, BUT DENIES ANY VISION LOSS.  PATIENT STATES THAT SHE HAS NOT EXPERIENCED THIS BEFORE.  SYMPTOMS STARTED LAST NIGHT, BUT DID GET MUCH WORSE TODAY.  PATIENT DENIES ANY SLURRED SPEECH, FACIAL DROOP, MUSCLE WEAKNESS, CHEST PAIN, SHORTNESS OF BREATH OR RECENT FEVERS/CHILLS.  NOTHING REALLY SEEMS TO MAKE HER SYMPTOMS BETTER.  PATIENT IS UNCOMFORTABLE, BUT IN NO APPARENT DISTRESS AND DENIES ANY FURTHER COMPLAINTS AT TIME OF EXAMINATION.        Review of Systems     No other overt Review of Systems are noted to be positive except noted in the HPI.      Historical Data   History Reviewed This Encounter: Medical History  Surgical History  Family History  Social History      Physical Exam   ED Triage Vitals [11/11/22 0729]   BP (Non-Invasive) (!) 205/140   Heart Rate (!) 136   Respiratory Rate 20   Temperature 36.8 C (98.2 F)   SpO2 100 %   Weight 49.9 kg (110 lb)   Height          Nursing notes reviewed for what could be assessed. Past Medical, Surgical, and Social history reviewed for what has been completed.     Constitutional:  UNCOMFORTABLE.  NO APPARENT DISTRESS.  Head: Normocephalic, atraumatic.  Mouth/Throat: no nasal discharge  Eyes:  PATIENT HAS WHAT APPEARS TO BE A BULGING LEFT EYE WITH IRREGULARITIES SEEN WITHIN THE PUPIL.  PUPIL IS  ROUND.  THERE WAS REDNESS OF THE CONJUNCTIVA.  NO OBVIOUS FOREIGN BODY OR ABRASION NOTED ON EXAM.  RIGHT EYE IS MILDLY INJECTED, BUT APPEARS OTHERWISE UNREMARKABLE.  Cardiovascular: Regular Rate and Rhythm, extremities well perfused.  Pulmonary/Chest: No respiratory distress. Lungs are symmetric to auscultation bilaterally.  Abdominal: Soft, non-tender, non-distended.   MSK: No Lower Extremity Edema.  Skin: Warm, dry, and intact  Neuro: Appropriate, CN II-XII grossly intact   Psych: Cooperative           Procedures      Patient Data     Labs Ordered/Reviewed   COMPREHENSIVE METABOLIC PANEL, NON-FASTING - Abnormal; Notable for the following components:       Result Value    SODIUM 131 (*)     CREATININE 0.42 (*)     BUN/CREA RATIO 24 (*)     GLUCOSE 387 (*)     ALKALINE PHOSPHATASE 180 (*)     All other components within normal limits    Narrative:     Estimated Glomerular Filtration Rate (eGFR) is calculated using the CKD-EPI (2021) equation, intended for patients 57 years of age and older. If gender is not documented or "unknown", there will be no eGFR calculation.     DRUG SCREEN, NO CONFIRMATION, URINE -  Abnormal; Notable for the following components:    CANNAQL Positive (*)     All other components within normal limits    Narrative:     Any results reported as "positive" on this urine drug screen are unconfirmed screening results and should be used for medical(i.e.,treatment)purposes only. Unconfirmed screening results must not be used for non-medical purposes (e.g. employment or legal testing). Upon request, all results reported as "positive" can be sent to a reference laboratory for confirmation by [place].     Reporting Limits (cut-off concentrations)     Cocaine 300 ng/mL  Opiates 300 ng/mL  THC 50 ng/mL  Amphetamine 1000 ng/mL  Phencyclidine 25 ng/mL  Benzodiazepine 300 ng/mL  Barbiturates 300 ng/mL  Methadone 300 ng/mL  Oxycodone 100 ng/mL  Buprenorphine 5 ng/mL  Fentanyl 5 ng/mL     CBC WITH DIFF - Abnormal;  Notable for the following components:    RBC 4.97 (*)     HGB 15.1 (*)     HCT 44.5 (*)     All other components within normal limits   URINALYSIS, MACROSCOPIC - Abnormal; Notable for the following components:    SPECIFIC GRAVITY 1.046 (*)     GLUCOSE >1000 (*)     KETONES 60 (*)     All other components within normal limits   URINALYSIS, MICROSCOPIC - Abnormal; Notable for the following components:    BUDDING YEAST Rare (*)     RBCS 5 (*)     All other components within normal limits   TROPONIN-I - Normal   C-REACTIVE PROTEIN (CRP) - Normal   PT/INR - Normal    Narrative:     INR OF 2.0-3.0  RECOMMENDED FOR: PROPHYLAXIS/TREATMENT OF VENEOUS THROMBOSIS, PULMONARY EMBOLISM, PREVENTION OF SYSTEMIC EMBOLISM FROM ATRIAL FIBRILATION, MYOCARDIAL INFARCTION.    INR OF 2.5-3.5  RECOMMENDED FOR MECHANICAL PROSTHETIC HEART VALVES, RECURRENT SYSTEMIC EMBOLISM, RECURRENT MYOCARDIAL INFARCTION.     PTT (PARTIAL THROMBOPLASTIN TIME) - Normal   CBC/DIFF    Narrative:     The following orders were created for panel order CBC/DIFF.  Procedure                               Abnormality         Status                     ---------                               -----------         ------                     CBC WITH YVOP[[id number]]                Abnormal            Final result               MANUAL DIFFERENTIAL[[id number]]                              Final result                 Please view results for these tests on the individual orders.   URINALYSIS, MACROSCOPIC AND MICROSCOPIC W/CULTURE REFLEX    Narrative:  The following orders were created for panel order URINALYSIS, MACROSCOPIC AND MICROSCOPIC W/CULTURE REFLEX.  Procedure                               Abnormality         Status                     ---------                               -----------         ------                     URINALYSIS, MACROSCOPIC[[id number]]      Abnormal            Final result               URINALYSIS, MICROSCOPIC[[id number]]      Abnormal             Final result                 Please view results for these tests on the individual orders.   MANUAL DIFFERENTIAL       CT BRAIN WO IV CONTRAST   Final Result by Edi, Rad[facility] In (01/29 6063)   NO ACUTE FINDINGS         One or more dose reduction techniques were used (e.g., Automated exposure control, adjustment of the mA and/or kV according to patient size, use of iterative reconstruction technique).         Radiologist location ID: [place] Decision Making          Medical Decision Making        Studies Assessed:  LAB WORK, IMAGING, EKG    EKG:   This EKG interpreted by me shows:    Rate:  81    Interpretation:  NORMAL AXIS, SINUS RHYTHM, RATE 81, NO ST-T WAVE CHANGES      MDM Narrative:  PATIENT PRESENTED TO THE EMERGENCY DEPARTMENT WITH COMPLAINTS OF LEFT-SIDED HEADACHE AND LEFT-SIDED EYE PAIN WITH BLURRY VISION THAT STARTED LAST NIGHT.  PATIENT REPORTS DISCOMFORT IN THE LEFT EYE THAT WORSENS WITH BLINKING.  SHE STATES THAT SHE FEELS AS THOUGH SOMETHING IS SCRATCHING HER EYE.  SHE COMPLAINS OF PAIN IN THE EYE, AS WELL AS LEFT-SIDED HEADACHE.  SHE DOES ADMIT TO BLURRY VISION, BUT DENIES ANY VISION LOSS.  PATIENT STATES THAT SHE HAS NOT EXPERIENCED THIS BEFORE.  SYMPTOMS STARTED LAST NIGHT, BUT DID GET MUCH WORSE TODAY.  PATIENT DENIES ANY SLURRED SPEECH, FACIAL DROOP, MUSCLE WEAKNESS, CHEST PAIN, SHORTNESS OF BREATH OR RECENT FEVERS/CHILLS.  NOTHING REALLY SEEMS TO MAKE HER SYMPTOMS BETTER.  PATIENT IS UNCOMFORTABLE, BUT IN NO APPARENT DISTRESS AND DENIES ANY FURTHER COMPLAINTS AT TIME OF EXAMINATION.  PHYSICAL EXAMINATION REVEALS SOME TYPE OF ABNORMALITY WITHIN THE LEFT PUPIL.  I AM UNSURE EXACTLY WHAT I AM LOOKING AT.  PATIENT HAS NO DEFICIT IN EXTRAOCULAR MOVEMENTS.  PAIN DOES NOT SEEM TO BE ELICITED WITH MOVEMENT.  PUPILS ARE EQUAL, ROUND AND REACTIVE TO LIGHT AND ACCOMMODATION.  HOWEVER, THE LEFT PUPIL SEEMS TO BE BULGING, COMPARED TO THE RIGHT.  SLIT-LAMP EXAM WITH FLUORESCEIN DID  NOT REVEAL ANY OBVIOUS ABRASIONS OR FOREIGN BODIES.  TETRACAINE WAS ADMINISTERED  AND PATIENT DID HAVE A SIGNIFICANT IMPROVEMENT IN SYMPTOMS.  SHE DOES STILL COMPLAIN OF SOME LEFT-SIDED HEADACHE, BUT STATES THAT IT IS ALSO IMPROVING.  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  TONO-PEN WAS ATTEMPTED MULTIPLE TIMES, BUT WAS UNSUCCESSFUL, AS IT WAS UNABLE TO BE CALIBRATED.  HEART AND LUNG EXAM WITHIN NORMAL LIMITS.  BLOOD PRESSURE HAS IMPROVED WITHOUT MEDICATION AND PATIENT STATES THAT IT ALWAYS INCREASES WITH PAIN.  SHE HAS NOT TAKEN HER HOME DOSE OF RAMIPRIL THIS MORNING.      ED Course as of 11/11/22 1156   Mon Nov 11, 2022   0830 I WAS ABLE TO SPEAK WITH THE TRANSFER CENTER AT [facility].  CASE WAS DISCUSSED AND INFORMATION WAS PROVIDED.  THEY WILL SPEAK WITH THEIR OPHTHALMOLOGIST AND CALL ME BACK AS SOON AS POSSIBLE.   0846 I WAS ABLE TO SPEAK WITH DR. Terance Hart, OPHTHALMOLOGY AT [facility].  HE RECOMMENDS IV DIAMOX, TIMOLOL, BRIMONIDINE AND DORZOLAMIDE.  HE RECOMMENDS CONTINUING TO CALIBRATE THE TONO-PEN AND CALL HIM BACK WITH IMPROVEMENT OR WORSENING OF SYMPTOMS.   0847 WBC 9.3, HEMOGLOBIN 15.1, PLATELET COUNT 250, PTT 29.4, PT 11.9, INR 1.03, CRP 0.3, SODIUM 131, POTASSIUM 5.1, BUN 10, CREATININE 0.42, GLUCOSE 387   0848 BLOOD PRESSURE HAS SIGNIFICANTLY IMPROVED TO 141/92   0852 TROPONIN 7   0854 I WAS ABLE TO CALIBRATE THE TONO-PEN, BUT PATIENT WAS IN CT.  I WILL TRY AGAIN WHEN SHE RETURNS.   0912 I WAS ABLE TO OBTAIN TONO-PEN READINGS AND PATIENT HAS LEFT EYE HAD A PRESSURE OF 80.  EYEDROPS AND IV MEDICATION HAS BEEN ORDERED.   0623 CT IMAGING OF THE BRAIN REVEALED:   FINDINGS:  There is no acute intracranial hemorrhage, mass effect, or evidence of large acute infarct.     Brain: Low density in the periventricular white matter suggests mild chronic small vessel ischemic changes.     CSF Spaces: Normal      Sinuses/Mastoids:  There is chronic  opacification of the right aspect of sphenoid sinus with some bony sclerosis indicating chronic sinusitis. There is some mild mucosal thickening involving the ethmoid and maxillary sinuses.      Bones: Unremarkable        IMPRESSION:  NO ACUTE FINDINGS   0952 PATIENT'S HOME DOSE OF LANTUS 40 MG ORDERED.  WE WILL CONTINUE TO MONITOR FOR THE NEED FOR ADDITIONAL INSULIN THERAPY.   0958 URINALYSIS REVEALED GLUCOSURIA AND KETONES, BUT NO EVIDENCE OF URINARY TRACT INFECTION   1003 PATIENT HAD REPORTED IMPROVEMENT IN THE EYE AND STATES THAT THE PAIN IS NOW MORE OF A "SORE FEELING." HOWEVER, I WAS JUST ABLE TO RE-EXAMINE THE PATIENT AND SHE STATES THAT SHE STARTED HAVING MORE PAIN ABOUT 10-15 MINUTES AGO.  TONO-PEN WAS USED AND PRESSURE IN THE LEFT EYE WAS 85.  NURSING STAFF INSTRUCTED TO RE-ADMINISTER EYEDROPS AT THIS TIME.   1019 UDS WAS POSITIVE FOR CANNABINOIDS   1035 RECHECK PRESSURE REVEALS LEFT EYE PRESSURE OF 41.  PATIENT ADMITS TO SIGNIFICANT IMPROVEMENT IN SYMPTOMS.   [place] WITH THE [facility].  INFORMATION WAS PROVIDED.  THEY WILL CONTACT THE OPHTHALMOLOGIST AND CALL ME BACK AS SOON AS POSSIBLE.         Medications Administered in the ED   ondansetron (ZOFRAN) 2 mg/mL injection (0 mg Intravenous Not Given 11/11/22 0900)   tetracaine (PONTOCAINE) 0.5 % ophthalmic solution (1 Drop Both Eyes Given  11/11/22 0825)   acetaZOLAMIDE (DIAMOX) 100 mg/mL injection (500 mg Intravenous Given 11/11/22 0913)   brimonidine (ALPHAGAN) 0.2% ophthalmic solution (1 Drop Left Eye Given 11/11/22 0913)   timolol (TIMOPTIC) 0.5% ophthalmic solution (1 Drop Left Eye Given 11/11/22 0924)   dorzolamide (TRUSOPT) 2 % ophthalmic solution (1 Drop Left Eye Given 11/11/22 0913)   dexAMETHasone (PF) 10 mg/mL injection (10 mg Intravenous Given 11/11/22 0945)   insulin glargine 100 units/mL injection (40 Units Subcutaneous Given 11/11/22 0950)   brimonidine (ALPHAGAN) 0.2% ophthalmic solution (1 Drop Left Eye Given 11/11/22  1009)   dorzolamide (TRUSOPT) 2 % ophthalmic solution (1 Drop Left Eye Given 11/11/22 1011)   timolol (TIMOPTIC) 0.5% ophthalmic solution (1 Drop Left Eye Given 11/11/22 1013)   timolol (TIMOPTIC) 0.5% ophthalmic solution (1 Drop Right Eye Given 11/11/22 1020)     I CALLED [facility] TO GET AN UPDATE.  OPHTHALMOLOGIST HAS SEEN THE MESSAGE, BUT HAS NOT CALLED BACK.  PATIENT STATES THAT SHE IS LEAVING AGAINST MEDICAL ADVICE.  SHE STATES THAT SHE WILL NOT RIDE IN AN AMBULANCE TO [place] AND REFUSES TRANSPORT OR TRANSFER AT THIS TIME.  MYSELF, NURSING STAFF AND FAMILY MEMBERS BOTH PLEADED WITH HER TO STAY FOR TRANSFER, BUT PATIENT REFUSES.  SHE STATES THAT SHE WILL HAVE FAMILY MEMBERS DRIVE HER TO THE FACILITY.  SHE WAS INFORMED THAT I WAS UNABLE TO SPEAK WITH THE OPHTHALMOLOGIST, BUT SHE REFUSES TO STAY ANY LONGER.  PATIENT WAS COUNSELED AND EDUCATED ON THE RISKS ASSOCIATED WITH LEAVING AGAINST MEDICAL ADVICE, INCLUDING LOSS OF VISION IN THAT EYE OR WORSE.  PATIENT VERBALIZED UNDERSTANDING.  ALL QUESTIONS WERE ANSWERED TO SATISFACTION.  PAPERWORK WAS FILED.  PATIENT LEFT AGAINST MEDICAL ADVICE.      I recommended and offered further workup/admission to the patient for further evaluation and treatment.  Risk of worsening illness, long term disability, or even death as a result of declining further workup/admission discussed with and understood by patient.  Patient demonstrates adequate comprehension and decision making capacity.  Patient stated understanding of why, as the treating physician, I have recommended further workup/admission and the possible adverse outcomes of declining such at this time.  All questions were answered.  The patient is encouraged to return to the Emergency Department at any time, and for any reason, to be re-evaluated and treated further.    AMA    AFTER PATIENT HAD LEFT AGAINST MEDICAL ADVICE FROM THE EMERGENCY DEPARTMENT, OPHTHALMOLOGY CALLED ME BACK.  THEY WERE INFORMED THAT  PATIENT WOULD BE TRAVELING TO THEIR FACILITY BY PRIVATE VEHICLE.  THEY WERE UPDATED ON IMPROVEMENTS IN INTRA-OCULAR PRESSURE FOLLOWING EYEDROPS AND IV MEDICATION.  THEY WERE PROVIDED WITH TELEPHONE NUMBERS TO REACH THE PATIENT, IF NEEDED.  THEY WILL ATTEMPT TO HAVE PATIENT FOLLOW UP IN THE OFFICE, IF POSSIBLE.         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

## 2022-11-11 NOTE — ED Nurses Note (Signed)
Right eye noted to be blood shot, provider reports IOP is 35-40 and verbal order given to instill 1 drop timolol at this time.

## 2022-11-11 NOTE — ED Nurses Note (Signed)
Pt reports after provider administered tetracain pain in eye is diminished, as well as patient is no longer tearful and appears more relaxed.

## 2022-11-11 NOTE — ED Nurses Note (Signed)
Pt called this nurse to bedside, request to sign out AMA. States "I cannot ride in an ambulance and I just want to leave" Pt educated on risk including loss of vision that would be permanent if eye pressure is not properly treated. Pt states "I understand Ill go straight to [facility] myself but Im not riding in in an ambulance". Pt advised we are still waiting to hear back from glaucoma specialist on acceptance and instructions. Pt reports "I don't care I just got to get out of here". Provider made aware. Glaucoma drops that were used on patient and at bedside sent with patient with instruction to not use them without direction from a physician. Advised pt that she should not smoke or drink caffeine to prevent increased IOP. IV removed, pressure dressing applied with no bleeding observed. Pt dresses self, ambulates with stable gait out of department with relatives at bedside x2.

## 2022-11-11 NOTE — ED Nurses Note (Signed)
Provider rechecked eye pressure in left eye at this time IOP currently 41

## 2022-11-11 NOTE — ED Triage Notes (Signed)
Headache with left eye pain started last night . Hx aneurysm.

## 2022-11-11 NOTE — ED Nurses Note (Signed)
Per provider, eye pressure in left eye back to 85 with c/o pain over past 15 min.

## 2022-11-11 NOTE — ED Nurses Note (Signed)
Left eye pain that started last night spontaneously while "playing bingo" that lead to headache. Pt reports she took ibuprofen and the pain let up and she went to bed but woke up "screaming" in pain this AM that is made better with continuous pressure to eye. BP noted to be elevated but she states "it does that when I hurt and I didn't take my BP pill this morning".

## 2022-11-11 NOTE — ED Nurses Note (Signed)
Provider at bedside at this time, advised pt of risk of leaving. Pt states to provider she plans to go straight to [facility] ER from [facility]. Pt ambulatory out of department at this time with personal belongings and accompanied by relative.

## 2022-11-16 DIAGNOSIS — R9431 Abnormal electrocardiogram [ECG] [EKG]: Secondary | ICD-10-CM

## 2022-11-16 LAB — ECG 12 LEAD
Atrial Rate: 81 {beats}/min
Calculated P Axis: 75 degrees
Calculated R Axis: 68 degrees
Calculated T Axis: 73 degrees
PR Interval: 154 ms
QRS Duration: 88 ms
QT Interval: 422 ms
QTC Calculation: 490 ms
Ventricular rate: 81 {beats}/min

## 2022-11-20 ENCOUNTER — Other Ambulatory Visit: Payer: Self-pay

## 2022-11-20 ENCOUNTER — Other Ambulatory Visit (HOSPITAL_COMMUNITY): Payer: Self-pay | Admitting: Family Medicine

## 2022-11-20 ENCOUNTER — Inpatient Hospital Stay
Admission: RE | Admit: 2022-11-20 | Discharge: 2022-11-20 | Disposition: A | Discharge status: Home / Self Care | Payer: MEDICAID | Source: Ambulatory Visit | Attending: Family Medicine | Admitting: Family Medicine

## 2022-11-20 ENCOUNTER — Inpatient Hospital Stay (HOSPITAL_BASED_OUTPATIENT_CLINIC_OR_DEPARTMENT_OTHER)
Admission: RE | Admit: 2022-11-20 | Discharge: 2022-11-20 | Disposition: A | Discharge status: Home / Self Care | Payer: MEDICAID | Source: Ambulatory Visit

## 2022-11-20 DIAGNOSIS — R52 Pain, unspecified: Secondary | ICD-10-CM

## 2022-11-20 DIAGNOSIS — R0781 Pleurodynia: Secondary | ICD-10-CM | POA: Insufficient documentation

## 2022-11-20 DIAGNOSIS — Z853 Personal history of malignant neoplasm of breast: Secondary | ICD-10-CM

## 2022-11-20 DIAGNOSIS — R1012 Left upper quadrant pain: Secondary | ICD-10-CM

## 2022-11-20 DIAGNOSIS — M7989 Other specified soft tissue disorders: Secondary | ICD-10-CM

## 2022-11-21 DIAGNOSIS — R0781 Pleurodynia: Secondary | ICD-10-CM

## 2022-11-28 ENCOUNTER — Encounter (HOSPITAL_BASED_OUTPATIENT_CLINIC_OR_DEPARTMENT_OTHER): Payer: Self-pay

## 2022-11-28 ENCOUNTER — Emergency Department
Admission: EM | Admit: 2022-11-28 | Discharge: 2022-11-29 | Disposition: A | Discharge status: Home / Self Care | Payer: MEDICAID | Attending: Family | Admitting: Family

## 2022-11-28 ENCOUNTER — Emergency Department (HOSPITAL_BASED_OUTPATIENT_CLINIC_OR_DEPARTMENT_OTHER): Payer: MEDICAID

## 2022-11-28 ENCOUNTER — Other Ambulatory Visit: Payer: Self-pay

## 2022-11-28 DIAGNOSIS — R739 Hyperglycemia, unspecified: Secondary | ICD-10-CM

## 2022-11-28 DIAGNOSIS — I6529 Occlusion and stenosis of unspecified carotid artery: Secondary | ICD-10-CM | POA: Insufficient documentation

## 2022-11-28 DIAGNOSIS — E1165 Type 2 diabetes mellitus with hyperglycemia: Secondary | ICD-10-CM | POA: Insufficient documentation

## 2022-11-28 DIAGNOSIS — R000 Tachycardia, unspecified: Secondary | ICD-10-CM | POA: Insufficient documentation

## 2022-11-28 DIAGNOSIS — E860 Dehydration: Secondary | ICD-10-CM

## 2022-11-28 DIAGNOSIS — I10 Essential (primary) hypertension: Secondary | ICD-10-CM

## 2022-11-28 DIAGNOSIS — Z1152 Encounter for screening for COVID-19: Secondary | ICD-10-CM | POA: Insufficient documentation

## 2022-11-28 DIAGNOSIS — Z7984 Long term (current) use of oral hypoglycemic drugs: Secondary | ICD-10-CM | POA: Insufficient documentation

## 2022-11-28 DIAGNOSIS — G43909 Migraine, unspecified, not intractable, without status migrainosus: Secondary | ICD-10-CM

## 2022-11-28 DIAGNOSIS — H5789 Other specified disorders of eye and adnexa: Secondary | ICD-10-CM | POA: Insufficient documentation

## 2022-11-28 DIAGNOSIS — Z794 Long term (current) use of insulin: Secondary | ICD-10-CM | POA: Insufficient documentation

## 2022-11-28 DIAGNOSIS — H579 Unspecified disorder of eye and adnexa: Secondary | ICD-10-CM

## 2022-11-28 LAB — CBC WITH DIFF
BASOPHIL #: 0.03 10*3/uL (ref 0.00–0.30)
BASOPHIL %: 0 % (ref 0–3)
EOSINOPHIL #: 0.12 10*3/uL (ref 0.00–0.80)
EOSINOPHIL %: 1 % (ref 0–7)
HCT: 44.4 % (ref 37.0–47.0)
HGB: 15.3 g/dL (ref 12.5–16.0)
LYMPHOCYTE #: 2.57 10*3/uL (ref 1.10–5.00)
LYMPHOCYTE %: 29 % (ref 25–45)
MCH: 32.5 pg — ABNORMAL HIGH (ref 27.0–32.0)
MCHC: 34.4 g/dL (ref 32.0–36.0)
MCV: 94.4 fL (ref 78.0–99.0)
MONOCYTE #: 0.53 10*3/uL (ref 0.00–1.30)
MONOCYTE %: 6 % (ref 0–12)
MPV: 8.1 fL (ref 7.4–10.4)
NEUTROPHIL #: 5.63 10*3/uL (ref 1.80–8.40)
NEUTROPHIL %: 63 % (ref 40–76)
PLATELETS: 310 10*3/uL (ref 140–440)
RBC: 4.7 10*6/uL (ref 4.20–5.40)
RDW: 15.1 % — ABNORMAL HIGH (ref 11.6–14.8)
WBC: 8.9 10*3/uL (ref 4.0–10.5)

## 2022-11-28 LAB — COMPREHENSIVE METABOLIC PANEL, NON-FASTING
ALBUMIN/GLOBULIN RATIO: 0.9 (ref 0.8–1.4)
ALBUMIN: 3.5 g/dL (ref 3.4–5.0)
ALKALINE PHOSPHATASE: 179 U/L — ABNORMAL HIGH (ref 46–116)
ALT (SGPT): 13 U/L (ref <=78)
ANION GAP: 14 mmol/L — ABNORMAL HIGH (ref 4–13)
AST (SGOT): 12 U/L — ABNORMAL LOW (ref 15–37)
BILIRUBIN TOTAL: 0.3 mg/dL (ref 0.2–1.0)
BUN/CREA RATIO: 20
BUN: 11 mg/dL (ref 7–18)
CALCIUM, CORRECTED: 9.5 mg/dL
CALCIUM: 9.1 mg/dL (ref 8.5–10.1)
CHLORIDE: 99 mmol/L (ref 98–107)
CO2 TOTAL: 23 mmol/L (ref 21–32)
CREATININE: 0.55 mg/dL (ref 0.55–1.02)
ESTIMATED GFR: 108 mL/min/{1.73_m2} (ref >59)
GLOBULIN: 4.1
GLUCOSE: 251 mg/dL — ABNORMAL HIGH (ref 74–106)
OSMOLALITY, CALCULATED: 280 mOsm/kg (ref 270–290)
POTASSIUM: 3.5 mmol/L (ref 3.5–5.1)
PROTEIN TOTAL: 7.6 g/dL (ref 6.4–8.2)
SODIUM: 136 mmol/L (ref 136–145)

## 2022-11-28 LAB — COVID-19, FLU A/B, RSV RAPID BY PCR
INFLUENZA VIRUS TYPE A: NOT DETECTED
INFLUENZA VIRUS TYPE B: NOT DETECTED
RESPIRATORY SYNCTIAL VIRUS (RSV): NOT DETECTED
SARS-CoV-2: NOT DETECTED

## 2022-11-28 LAB — VENOUS BLOOD GAS/LACTATE
%FIO2 (VENOUS): 21 %
BASE EXCESS: 0.4 mmol/L (ref <=3.0)
BICARBONATE (VENOUS): 23.7 mmol/L (ref 22.0–26.0)
CARBOXYHEMOGLOBIN: 9.4 % (ref <=1.5)
LACTATE: 1.5 mmol/L (ref <=2.0)
MET-HEMOGLOBIN: 0.2 % (ref <=2.0)
O2CT: 16.7 %
OXYHEMOGLOBIN: 78.6 %
PCO2 (VENOUS): 34 mm/Hg — ABNORMAL LOW (ref 41–51)
PH (VENOUS): 7.45 — ABNORMAL HIGH (ref 7.31–7.41)
PO2 (VENOUS): 47 mm/Hg (ref 35–50)

## 2022-11-28 LAB — TROPONIN-I: TROPONIN I: 10 ng/L (ref <15)

## 2022-11-28 LAB — LACTIC ACID LEVEL W/ REFLEX FOR LEVEL >2.0: LACTIC ACID: 1.7 mmol/L (ref 0.4–2.0)

## 2022-11-28 LAB — MAGNESIUM: MAGNESIUM: 1.6 mg/dL — ABNORMAL LOW (ref 1.8–2.4)

## 2022-11-28 MED ORDER — BUPRENORPHINE HCL 0.3 MG/ML INJECTION SOLUTION
INTRAMUSCULAR | Status: AC
Start: 2022-11-28 — End: 2022-11-28
  Filled 2022-11-28: qty 1

## 2022-11-28 MED ORDER — ONDANSETRON HCL (PF) 4 MG/2 ML INJECTION SOLUTION
4.0000 mg | INTRAMUSCULAR | Status: AC
Start: 2022-11-28 — End: 2022-11-28
  Administered 2022-11-28: 4 mg via INTRAVENOUS

## 2022-11-28 MED ORDER — BUPRENORPHINE HCL 0.3 MG/ML INJECTION SOLUTION
0.1500 mg | INTRAMUSCULAR | Status: AC
Start: 2022-11-28 — End: 2022-11-28
  Administered 2022-11-28: 0.15 mg via INTRAVENOUS

## 2022-11-28 MED ORDER — METOPROLOL TARTRATE 5 MG/5 ML INTRAVENOUS SOLUTION
2.5000 mg | INTRAVENOUS | Status: AC
Start: 2022-11-28 — End: 2022-11-28
  Administered 2022-11-28: 2.5 mg via INTRAVENOUS

## 2022-11-28 MED ORDER — BALANCED SALT SOLUTION COMBINATION NO.2 INTRAOCULAR IRRIGATION
INTRAOCULAR | Status: AC
Start: 2022-11-28 — End: 2022-11-28
  Filled 2022-11-28: qty 15

## 2022-11-28 MED ORDER — ONDANSETRON HCL (PF) 4 MG/2 ML INJECTION SOLUTION
INTRAMUSCULAR | Status: AC
Start: 2022-11-28 — End: 2022-11-28
  Filled 2022-11-28: qty 2

## 2022-11-28 MED ORDER — SODIUM CHLORIDE 0.9 % (FLUSH) INJECTION SYRINGE
3.0000 mL | INJECTION | Freq: Three times a day (TID) | INTRAMUSCULAR | Status: DC
Start: 2022-11-28 — End: 2022-11-29
  Administered 2022-11-28: 0 mL

## 2022-11-28 MED ORDER — METOPROLOL TARTRATE 5 MG/5 ML INTRAVENOUS SOLUTION
INTRAVENOUS | Status: AC
Start: 2022-11-28 — End: 2022-11-28
  Filled 2022-11-28: qty 5

## 2022-11-28 MED ORDER — SODIUM CHLORIDE 0.9 % (FLUSH) INJECTION SYRINGE
3.0000 mL | INJECTION | INTRAMUSCULAR | Status: DC | PRN
Start: 2022-11-28 — End: 2022-11-29

## 2022-11-28 MED ORDER — SODIUM CHLORIDE 0.9 % IV BOLUS
1000.0000 mL | INJECTION | Status: AC
Start: 2022-11-28 — End: 2022-11-28
  Administered 2022-11-28: 1000 mL via INTRAVENOUS
  Administered 2022-11-28: 0 mL via INTRAVENOUS

## 2022-11-28 MED ORDER — MAGNESIUM SULFATE 1 GRAM/100 ML IN DEXTROSE 5 % INTRAVENOUS PIGGYBACK
INJECTION | INTRAVENOUS | Status: AC
Start: 2022-11-28 — End: 2022-11-28
  Filled 2022-11-28: qty 200

## 2022-11-28 MED ORDER — MAGNESIUM SULFATE 1 GRAM/100 ML IN DEXTROSE 5 % INTRAVENOUS PIGGYBACK
1.0000 g | INJECTION | Freq: Once | INTRAVENOUS | Status: AC
Start: 2022-11-29 — End: 2022-11-28
  Administered 2022-11-28: 1 g via INTRAVENOUS
  Administered 2022-11-28: 0 g via INTRAVENOUS

## 2022-11-28 MED ORDER — TETRACAINE 0.5 % EYE DROPS
OPHTHALMIC | Status: AC
Start: 2022-11-28 — End: 2022-11-28
  Filled 2022-11-28: qty 100

## 2022-11-28 MED ORDER — PROCHLORPERAZINE EDISYLATE 10 MG/2 ML (5 MG/ML) INJECTION SOLUTION
10.0000 mg | INTRAMUSCULAR | Status: AC
Start: 2022-11-28 — End: 2022-11-28
  Administered 2022-11-28: 10 mg via INTRAVENOUS

## 2022-11-28 MED ORDER — MAGNESIUM SULFATE 1 GRAM/100 ML IN DEXTROSE 5 % INTRAVENOUS PIGGYBACK
1.0000 g | INJECTION | Freq: Once | INTRAVENOUS | Status: AC
Start: 2022-11-29 — End: 2022-11-28
  Administered 2022-11-28: 0 g via INTRAVENOUS
  Administered 2022-11-28: 1 g via INTRAVENOUS

## 2022-11-28 MED ORDER — TETRACAINE 0.5 % EYE DROPS
1.0000 [drp] | OPHTHALMIC | Status: AC
Start: 2022-11-28 — End: 2022-11-28
  Administered 2022-11-28: 1 [drp] via OPHTHALMIC

## 2022-11-28 MED ORDER — IOHEXOL 350 MG IODINE/ML INTRAVENOUS SOLUTION
100.0000 mL | INTRAVENOUS | Status: AC
Start: 2022-11-28 — End: 2022-11-28
  Administered 2022-11-28: 100 mL via INTRAVENOUS

## 2022-11-28 MED ORDER — PROCHLORPERAZINE EDISYLATE 10 MG/2 ML (5 MG/ML) INJECTION SOLUTION
INTRAMUSCULAR | Status: AC
Start: 2022-11-28 — End: 2022-11-28
  Filled 2022-11-28: qty 2

## 2022-11-28 MED ORDER — FLUORESCEIN 1 MG EYE STRIPS
ORAL_STRIP | OPHTHALMIC | Status: AC
Start: 2022-11-28 — End: 2022-11-28
  Filled 2022-11-28: qty 1

## 2022-11-28 MED ORDER — METOPROLOL TARTRATE 5 MG/5 ML INTRAVENOUS SOLUTION
5.0000 mg | INTRAVENOUS | Status: DC
Start: 2022-11-28 — End: 2022-11-28

## 2022-11-28 NOTE — Discharge Instructions (Signed)
You were seen in the emergency department for eye pain and headache.  Call Dr. Jacki Cones tomorrow for re-evaluation.  Return to emergency department if you blurry vision , return of your headache nausea vomiting confusion or any other concerning symptoms.  Thank you for visiting [place]

## 2022-11-28 NOTE — ED Attending Handoff Note (Signed)
MDM:    ED Course as of 11/28/22 2357   Thu Nov 28, 2022   2346 Following administration of Compazine and fluids patient is resting comfortably.  Her headache and eye pain has resolved.  Denies blurry vision.  Intra-ocular pressures are 6 and 9 OS/OD respectively.  CT imaging of the head with contrast and angiography do not reveal an acute abnormality.  Visual acuity is preserved.  Presentation is consistent with a migraine-type headache as opposed to acute angle glaucoma CRA 0 or other critical pathology.  I did review these findings and my assessment with the family and answered the patient's questions.  I reviewed patient's most recent ED know where pupillary abnormalities and very elevated intra-ocular pressures were noted.  I am not seeing this here tonight.  Recommended close primary and ophtho follow-up.  I gave strict return to ED instructions in both a verbal and written manner.  She was comfortable with with this plan.      Discharged  Clinical Impression   Eye pressure   Hypertension, uncontrolled   Hyperglycemia   Dehydration   Hypomagnesemia   Migraine headache (Primary)     Medications Administered in the ED   NS flush syringe (0 mL Intracatheter Not Given 11/28/22 2200)   NS flush syringe (has no administration in time range)   NS bolus infusion 1,000 mL (0 mL Intravenous Stopped 11/28/22 2305)   buprenorphine (BUPRENEX) 0.3 mg/mL injection (0.15 mg Intravenous Given 11/28/22 2205)   ondansetron (ZOFRAN) 2 mg/mL injection (4 mg Intravenous Given 11/28/22 2205)   metoprolol (LOPRESSOR) 1 mg/mL injection (2.5 mg Intravenous Given 11/28/22 2300)   tetracaine (PONTOCAINE) 0.5 % ophthalmic solution (1 Drop Both Eyes Given 11/28/22 2255)   metoprolol (LOPRESSOR) 1 mg/mL injection (2.5 mg Intravenous Given 11/28/22 2315)   magnesium sulfate 1 G in D5W 100 mL premix IVPB (0 g Intravenous Stopped 11/28/22 2326)   magnesium sulfate 1 G in D5W 100 mL premix IVPB (0 g Intravenous Stopped 11/28/22 2326)   prochlorperazine  (COMPAZINE) 5 mg/mL injection (10 mg Intravenous Given 11/28/22 2313)   iohexol (OMNIPAQUE 350) infusion (100 mL Intravenous Given 11/28/22 2318)        Current Discharge Medication List        CONTINUE these medications - NO CHANGES were made during your visit.        Details   aspirin 81 mg Tablet, Chewable   81 mg, Oral, DAILY  Refills: 0     benzonatate 100 mg Capsule  Commonly known as: TESSALON   100 mg, Oral, EVERY 8 HOURS PRN  Qty: 12 Capsule  Refills: 0     empagliflozin 25 mg Tablet  Commonly known as: JARDIANCE   25 mg, Oral, DAILY  Refills: 0     * gabapentin 800 mg Tablet  Commonly known as: NEURONTIN   800 mg, Oral, 3 TIMES DAILY  Refills: 0     * gabapentin 100 mg Capsule  Commonly known as: NEURONTIN   100 mg, 3 TIMES DAILY  Refills: 0     guaiFENesin 600 mg Tablet Extended Release 12hr  Commonly known as: MUCINEX   600 mg, Oral, EVERY 12 HOURS  Refills: 0     insulin glargine 100 unit/mL injection (vial)   40 Units, Subcutaneous, NIGHTLY  Refills: 0     ramipriL 10 mg Capsule  Commonly known as: ALTACE   10 mg, Oral, DAILY  Refills: 0           *  This list has 2 medication(s) that are the same as other medications prescribed for you. Read the directions carefully, and ask your doctor or other care provider to review them with you.

## 2022-11-28 NOTE — ED Triage Notes (Signed)
EMS reports pt c/o left sided HA, into left eye. C/o left eye vision is blurry. Elevated BP. Hx of similar episode. Denies n/v.

## 2022-11-28 NOTE — ED Provider Notes (Signed)
[facility], [facility] Emergency Department  ED Primary Provider Note  History of Present Illness   Chief Complaint   Patient presents with    Headache    Eye Pain     Arrival: The patient arrived by Ambulance  Gail Morris is a 57 y.o. female who had concerns including Headache and Eye Pain. Pt states she has a severe headache and hurts behind eyes left worse into back of head hx of aneurysms     Review of Systems   Constitutional: No fever, chills or weakness + loss of appetite.   Skin: No rash or diaphoresis  HENT: +headaches, or congestion  Eyes: No vision changes + photophobia + pressure behind eyes.   Cardio: No chest pain, palpitations or leg swelling   Respiratory: No cough, wheezing or SOB  GI:  No nausea, vomiting or stool changes  GU:  No dysuria, hematuria, or increased frequency  MSK: No muscle aches, joint or back pain  Neuro: No seizures, LOC, numbness, tingling, or focal weakness  Psychiatric: No depression, SI or substance abuse  All other systems reviewed and are negative.    Historical Data   History Reviewed This Encounter: all noted and reviewed    Physical Exam   ED Triage Vitals [11/28/22 2142]   BP (Non-Invasive) (!) 128/92   Heart Rate (!) 108   Respiratory Rate 20   Temperature 37.4 C (99.4 F)   SpO2 98 %   Weight 52.2 kg (115 lb)   Height 1.626 m (5' 4"$ )       Constitutional:  57 y.o. female who appears uncomfortable .  Normal color, no cyanosis.   HENT:   Head: Normocephalic and atraumatic.   Mouth/Throat: Oropharynx is clear and very dry.   Eyes: EOMI, PERRL , difficult to do eye exam pt withdrawing from light bil.   Neck: Trachea midline. Neck supple.  Cardiovascular: RRR, No murmurs, rubs or gallops. Intact distal pulses.  Pulmonary/Chest: BS equal bilaterally. No respiratory distress. No wheezes, rales or chest tenderness.   Abdominal: Bowel sounds present and normal. Abdomen soft, no tenderness, no rebound and no guarding.  Back: No midline spinal  tenderness, no paraspinal tenderness, no CVA tenderness.           Musculoskeletal: No edema, tenderness or deformity.  Skin: warm and dry. No rash, erythema, pallor or cyanosis  Psychiatric: normal mood and affect. Behavior is normal.   Neurological: Patient keenly alert and responsive, easily able to raise eyebrows, facial muscles/expressions symmetric, speaking in fluent sentences, moving all extremities equally and fully, normal gait  Patient Data     Labs Ordered/Reviewed   VENOUS BLOOD GAS/LACTATE - Abnormal; Notable for the following components:       Result Value    PH (VENOUS) 7.45 (*)     PCO2 (VENOUS) 34 (*)     CARBOXYHEMOGLOBIN 9.4 (*)     All other components within normal limits   COMPREHENSIVE METABOLIC PANEL, NON-FASTING - Abnormal; Notable for the following components:    ANION GAP 14 (*)     GLUCOSE 251 (*)     ALKALINE PHOSPHATASE 179 (*)     AST (SGOT) 12 (*)     All other components within normal limits    Narrative:     Estimated Glomerular Filtration Rate (eGFR) is calculated using the CKD-EPI (2021) equation, intended for patients 14 years of age and older. If gender is not documented or "unknown", there will be no  eGFR calculation.   MAGNESIUM - Abnormal; Notable for the following components:    MAGNESIUM 1.6 (*)     All other components within normal limits   CBC WITH DIFF - Abnormal; Notable for the following components:    MCH 32.5 (*)     RDW 15.1 (*)     All other components within normal limits   LACTIC ACID LEVEL W/ REFLEX FOR LEVEL >2.0 - Normal   TROPONIN-I - Normal    Narrative:     Values received on females ranging between 12-15 ng/L MUST include the next serial troponin to review changes in the delta differences as the reference range for the Access II chemistry analyzer is lower than the established reference range.     CBC/DIFF    Narrative:     The following orders were created for panel order CBC/DIFF.  Procedure                               Abnormality         Status                      ---------                               -----------         ------                     CBC WITH NG:[id number]                Abnormal            Final result                 Please view results for these tests on the individual orders.   EXTRA TUBES   C-REACTIVE PROTEIN (CRP)   SEDIMENTATION RATE   COVID-19, FLU A/B, RSV RAPID BY PCR     No orders to display     Medical Decision Making   Diff dx aneurysm , migraine.  Hypertensive urgency dka  glaucoma.  Temporal arteritis. Hyponatremia   2259 care endorsed to dr. Nelson Chimes cts pending.        Medications Administered in the ED   NS flush syringe (0 mL Intracatheter Not Given 11/28/22 2200)   NS flush syringe (has no administration in time range)   NS bolus infusion 1,000 mL (1,000 mL Intravenous New Bag/New Syringe 11/28/22 2205)   tetracaine (PONTOCAINE) 0.5 % ophthalmic solution (has no administration in time range)   metoprolol (LOPRESSOR) 1 mg/mL injection (has no administration in time range)   magnesium sulfate 1 G in D5W 100 mL premix IVPB (has no administration in time range)   magnesium sulfate 1 G in D5W 100 mL premix IVPB (has no administration in time range)   buprenorphine (BUPRENEX) 0.3 mg/mL injection (0.15 mg Intravenous Given 11/28/22 2205)   ondansetron (ZOFRAN) 2 mg/mL injection (4 mg Intravenous Given 11/28/22 2205)   metoprolol (LOPRESSOR) 1 mg/mL injection (2.5 mg Intravenous Given 11/28/22 2300)     Clinical Impression   Severe headache (Primary)   Eye pressure   Hypertension, uncontrolled   Hyperglycemia   Dehydration   Hypomagnesemia       Disposition: Transfered to Another Facility

## 2022-11-29 LAB — SEDIMENTATION RATE: ERYTHROCYTE SEDIMENTATION RATE (ESR): 42 mm/hr — ABNORMAL HIGH (ref <30)

## 2022-11-29 LAB — C-REACTIVE PROTEIN (CRP): C-REACTIVE PROTEIN (CRP): 0.5 mg/dL (ref 0.1–0.5)

## 2022-11-29 NOTE — ED Nurses Note (Signed)
IV DC. Cath intact.  NO redness or edema at site.  VSS.  Verbalized understanding of discharge instructions and follow up information. AVS given to patient.  Left Er with no further complaints.

## 2022-11-30 DIAGNOSIS — R000 Tachycardia, unspecified: Secondary | ICD-10-CM

## 2022-11-30 LAB — ECG 12 LEAD
Atrial Rate: 109 {beats}/min
Calculated P Axis: 66 degrees
Calculated R Axis: 53 degrees
Calculated T Axis: 77 degrees
PR Interval: 150 ms
QRS Duration: 84 ms
QT Interval: 368 ms
QTC Calculation: 495 ms
Ventricular rate: 109 {beats}/min

## 2022-12-18 ENCOUNTER — Emergency Department
Admission: EM | Admit: 2022-12-18 | Discharge: 2022-12-18 | Disposition: A | Discharge status: Home / Self Care | Payer: Self-pay | Attending: Emergency Medicine | Admitting: Emergency Medicine

## 2022-12-18 ENCOUNTER — Other Ambulatory Visit: Payer: Self-pay

## 2022-12-18 ENCOUNTER — Encounter (HOSPITAL_BASED_OUTPATIENT_CLINIC_OR_DEPARTMENT_OTHER): Payer: Self-pay

## 2022-12-18 DIAGNOSIS — R030 Elevated blood-pressure reading, without diagnosis of hypertension: Secondary | ICD-10-CM | POA: Insufficient documentation

## 2022-12-18 DIAGNOSIS — K047 Periapical abscess without sinus: Secondary | ICD-10-CM | POA: Insufficient documentation

## 2022-12-18 MED ORDER — KETOROLAC 30 MG/ML (1 ML) INJECTION SOLUTION
15.0000 mg | INTRAMUSCULAR | Status: AC
Start: 2022-12-18 — End: 2022-12-18
  Administered 2022-12-18: 15 mg via INTRAMUSCULAR

## 2022-12-18 MED ORDER — AMOXICILLIN 500 MG CAPSULE
500.0000 mg | ORAL_CAPSULE | Freq: Three times a day (TID) | ORAL | 0 refills | Status: AC
Start: 2022-12-18 — End: 2022-12-25

## 2022-12-18 MED ORDER — KETOROLAC 30 MG/ML (1 ML) INJECTION SOLUTION
INTRAMUSCULAR | Status: AC
Start: 2022-12-18 — End: 2022-12-18
  Filled 2022-12-18: qty 1

## 2022-12-18 MED ORDER — AMOXICILLIN 500 MG CAPSULE
500.0000 mg | ORAL_CAPSULE | ORAL | Status: AC
Start: 2022-12-18 — End: 2022-12-18
  Administered 2022-12-18: 500 mg via ORAL

## 2022-12-18 MED ORDER — CHLORHEXIDINE GLUCONATE 0.12 % MOUTHWASH
15.0000 mL | MOUTHWASH | Freq: Two times a day (BID) | 0 refills | Status: AC
Start: 2022-12-18 — End: 2022-12-25

## 2022-12-18 MED ORDER — AMOXICILLIN 500 MG CAPSULE
ORAL_CAPSULE | ORAL | Status: AC
Start: 2022-12-18 — End: 2022-12-18
  Filled 2022-12-18: qty 1

## 2022-12-18 NOTE — Discharge Instructions (Signed)
Take antibiotics as prescribed to completion.  Alternate between Tylenol and ibuprofen every 4-6 hours.  See a dentist ideally within the next 2-3 days for re-evaluation.  Return to emergency department sooner if you have trouble breathing neck stiffness fevers that can not be controlled or any other concerning symptoms.  Thank you for visiting [facility]

## 2022-12-18 NOTE — ED Nurses Note (Signed)
Patient given written and verbal d/c instructions. All questions/concerns addressed. Informed to return with any concerns. Informed to follow up with dentist as soon as possible and informed of x2 prescriptions at pharmacy. Patient leaving ambulatory at this time.

## 2022-12-18 NOTE — ED Provider Notes (Signed)
[facility], [facility] Emergency Department  ED Primary Provider Note  HPI:  Gail Morris is a 57 y.o. female     Patient patient complains of dental pain.  Symptoms have been ongoing for about 2-3 days.  Denies swelling difficulty opening her mouth changes in voice trouble breathing.  No fevers no chills.  She states that she has a dental implant at the site and nobody will take it out.  No recent antibiotics.  Patient is a diabetic.[name]  She is    ROS review and negative aside from stated in HPI.    Physical Exam:  ED Triage Vitals [12/18/22 0633]   BP (Non-Invasive) (!) 151/79   Heart Rate 79   Respiratory Rate 16   Temperature 36.3 C (97.4 F)   SpO2 98 %   Weight 49.9 kg (110 lb)   Height 1.575 m (5\' 2" )     No acute distress.  Patient awake alert oriented x3.  Mood is appropriate.  Pupils 3 mm equal round reactive.  Extraocular movements are intact.  Oropharynx is clear.  There is no trismus.  Phonating appropriately.  No swelling of the floor of the mouth.  Pain appears to be originating from tooth 11.  There is no fluctuance there is no swelling or drainage.  from  Mucous membranes moist.  Trachea midline.  Neck is supple.  Heart has regular rate and rhythm without significant murmurs rubs or gallops.  Lungs are clear to auscultation.  Abdomen soft nontender, nondistended.  Moving all extremities without difficulty.  No rash no edema.      Patient data:  Labs Ordered/Reviewed - No data to display  No orders to display       MDM:  Dental pain likely secondary to dental infection.  Estimate low probability PTA RPA Ludwig's or deep space infection.  Patient mildly hypertensive otherwise vitals within normal limits.  She was started on amoxicillin given a dose of Toradol.  She will be discharged with a round of antibiotics, antibiotic mouthwash and provided list of area dentist.  Strict return to ED instructions were given.  Patient comfortable with discharge.       Discharged  Clinical Impression   Dental infection (Primary)     Medications Administered in the ED   ketorolac (TORADOL) 30 mg/mL injection (has no administration in time range)   amoxicillin (AMOXIL) capsule (has no administration in time range)        Current Discharge Medication List        START taking these medications.        Details   amoxicillin 500 mg Capsule  Commonly known as: AMOXIL   500 mg, Oral, 3 TIMES DAILY  Qty: 21 Capsule  Refills: 0     chlorhexidine gluconate 0.12 % Mouthwash  Commonly known as: PERIDEX   15 mL, Swish & Spit, 2 TIMES DAILY  Qty: 210 mL  Refills: 0            CONTINUE these medications - NO CHANGES were made during your visit.        Details   aspirin 81 mg Tablet, Chewable   81 mg, Oral, DAILY  Refills: 0     benzonatate 100 mg Capsule  Commonly known as: TESSALON   100 mg, Oral, EVERY 8 HOURS PRN  Qty: 12 Capsule  Refills: 0     empagliflozin 25 mg Tablet  Commonly known as: JARDIANCE   25 mg, Oral, DAILY  Refills: 0     * gabapentin 800 mg Tablet  Commonly known as: NEURONTIN   800 mg, Oral, 3 TIMES DAILY  Refills: 0     * gabapentin 100 mg Capsule  Commonly known as: NEURONTIN   100 mg, 3 TIMES DAILY  Refills: 0     guaiFENesin 600 mg Tablet Extended Release 12hr  Commonly known as: MUCINEX   600 mg, Oral, EVERY 12 HOURS  Refills: 0     insulin glargine 100 unit/mL injection (vial)   40 Units, Subcutaneous, NIGHTLY  Refills: 0     ramipriL 10 mg Capsule  Commonly known as: ALTACE   10 mg, Oral, DAILY  Refills: 0           * This list has 2 medication(s) that are the same as other medications prescribed for you. Read the directions carefully, and ask your doctor or other care provider to review them with you.

## 2022-12-18 NOTE — ED Triage Notes (Signed)
Pt reports left upper dental pain X 1 week

## 2022-12-18 NOTE — ED Nurses Note (Signed)
Pt c/o dental pain of upper L tooth. Pt states she has had the pain for about 2 weeks but the pain subsided and then started again tonight. Denies shortness of breath, denies chest pain. Family at bedside. Respirations even and unlabored w/ no s/s of acute distress noted at this time.

## 2023-03-16 ENCOUNTER — Emergency Department
Admission: EM | Admit: 2023-03-16 | Discharge: 2023-03-17 | Disposition: A | Discharge status: Home / Self Care | Payer: MEDICAID | Attending: Emergency Medicine | Admitting: Emergency Medicine

## 2023-03-16 ENCOUNTER — Emergency Department (HOSPITAL_COMMUNITY): Payer: MEDICAID

## 2023-03-16 ENCOUNTER — Encounter (HOSPITAL_COMMUNITY): Payer: Self-pay | Admitting: Emergency Medicine

## 2023-03-16 ENCOUNTER — Other Ambulatory Visit: Payer: Self-pay

## 2023-03-16 DIAGNOSIS — K5730 Diverticulosis of large intestine without perforation or abscess without bleeding: Secondary | ICD-10-CM | POA: Insufficient documentation

## 2023-03-16 DIAGNOSIS — Z7984 Long term (current) use of oral hypoglycemic drugs: Secondary | ICD-10-CM | POA: Insufficient documentation

## 2023-03-16 DIAGNOSIS — R81 Glycosuria: Secondary | ICD-10-CM | POA: Insufficient documentation

## 2023-03-16 DIAGNOSIS — Z8719 Personal history of other diseases of the digestive system: Secondary | ICD-10-CM | POA: Insufficient documentation

## 2023-03-16 DIAGNOSIS — Z9049 Acquired absence of other specified parts of digestive tract: Secondary | ICD-10-CM | POA: Insufficient documentation

## 2023-03-16 DIAGNOSIS — K5900 Constipation, unspecified: Secondary | ICD-10-CM | POA: Insufficient documentation

## 2023-03-16 DIAGNOSIS — Z794 Long term (current) use of insulin: Secondary | ICD-10-CM | POA: Insufficient documentation

## 2023-03-16 DIAGNOSIS — R109 Unspecified abdominal pain: Secondary | ICD-10-CM

## 2023-03-16 DIAGNOSIS — E1165 Type 2 diabetes mellitus with hyperglycemia: Secondary | ICD-10-CM | POA: Insufficient documentation

## 2023-03-16 DIAGNOSIS — R739 Hyperglycemia, unspecified: Secondary | ICD-10-CM

## 2023-03-16 LAB — COMPREHENSIVE METABOLIC PANEL, NON-FASTING
ALBUMIN/GLOBULIN RATIO: 1.2 (ref 0.8–1.4)
ALBUMIN: 4.2 g/dL (ref 3.5–5.7)
ALKALINE PHOSPHATASE: 220 U/L — ABNORMAL HIGH (ref 34–104)
ALT (SGPT): 14 U/L (ref 7–52)
ANION GAP: 11 mmol/L (ref 4–13)
AST (SGOT): 22 U/L (ref 13–39)
BILIRUBIN TOTAL: 0.3 mg/dL (ref 0.3–1.2)
BUN/CREA RATIO: 12 (ref 6–22)
BUN: 7 mg/dL (ref 7–25)
CALCIUM, CORRECTED: 9.3 mg/dL (ref 8.9–10.8)
CALCIUM: 9.5 mg/dL (ref 8.6–10.3)
CHLORIDE: 99 mmol/L (ref 98–107)
CO2 TOTAL: 23 mmol/L (ref 21–31)
CREATININE: 0.59 mg/dL — ABNORMAL LOW (ref 0.60–1.30)
ESTIMATED GFR: 106 mL/min/{1.73_m2} (ref >59)
GLOBULIN: 3.5 (ref 2.9–5.4)
GLUCOSE: 571 mg/dL (ref 74–109)
OSMOLALITY, CALCULATED: 291 mOsm/kg — ABNORMAL HIGH (ref 270–290)
POTASSIUM: 4.2 mmol/L (ref 3.5–5.1)
PROTEIN TOTAL: 7.7 g/dL (ref 6.4–8.9)
SODIUM: 133 mmol/L — ABNORMAL LOW (ref 136–145)

## 2023-03-16 LAB — URINALYSIS, MACROSCOPIC
BILIRUBIN: NEGATIVE mg/dL
BLOOD: NEGATIVE mg/dL
GLUCOSE: >1000 mg/dL — AB
KETONES: NEGATIVE mg/dL
LEUKOCYTES: NEGATIVE WBCs/uL
NITRITE: NEGATIVE
PH: 6.5 (ref 5.0–9.0)
PROTEIN: NEGATIVE mg/dL
SPECIFIC GRAVITY: 1.039 — ABNORMAL HIGH (ref 1.002–1.030)
UROBILINOGEN: NORMAL mg/dL

## 2023-03-16 LAB — BETA-HYDROXYBUTYRATE, PLASMA OR SERUM: BETA-HYDROXYBUTYRATE: 0.16 mmol/L (ref 0.02–0.29)

## 2023-03-16 LAB — CBC WITH DIFF
BASOPHIL #: 0.1 10*3/uL (ref 0.00–0.10)
BASOPHIL %: 1 % (ref 0–1)
EOSINOPHIL #: 0.2 10*3/uL (ref 0.00–0.50)
EOSINOPHIL %: 1 %
HCT: 43.8 % — ABNORMAL HIGH (ref 31.2–41.9)
HGB: 15.1 g/dL — ABNORMAL HIGH (ref 10.9–14.3)
LYMPHOCYTE #: 3.2 10*3/uL — ABNORMAL HIGH (ref 1.00–3.00)
LYMPHOCYTE %: 30 % (ref 16–44)
MCH: 31.8 pg (ref 24.7–32.8)
MCHC: 34.5 g/dL (ref 32.3–35.6)
MCV: 92.2 fL (ref 75.5–95.3)
MONOCYTE #: 0.7 10*3/uL (ref 0.30–1.00)
MONOCYTE %: 6 % (ref 5–13)
MPV: 8.5 fL (ref 7.9–10.8)
NEUTROPHIL #: 6.6 10*3/uL (ref 1.85–7.80)
NEUTROPHIL %: 62 % (ref 43–77)
PLATELET COMMENT: NORMAL
PLATELETS: 249 10*3/uL (ref 140–440)
RBC COMMENT: NORMAL
RBC: 4.75 10*6/uL (ref 3.63–4.92)
RDW: 13.7 % (ref 12.3–17.7)
WBC: 10.7 10*3/uL (ref 3.8–11.8)

## 2023-03-16 LAB — URINALYSIS, MICROSCOPIC
RBCS: 3 /hpf (ref <4)
SQUAMOUS EPITHELIAL: 2 /hpf (ref <28)

## 2023-03-16 LAB — BLUE TOP TUBE

## 2023-03-16 LAB — LACTIC ACID - SECOND REFLEX: LACTIC ACID: 2 mmol/L (ref 0.5–2.2)

## 2023-03-16 LAB — LIPASE: LIPASE: 40 U/L (ref 11–82)

## 2023-03-16 LAB — LACTIC ACID LEVEL W/ REFLEX FOR LEVEL >2.0: LACTIC ACID: 4.3 mmol/L (ref 0.5–2.2)

## 2023-03-16 LAB — LACTIC ACID - FIRST REFLEX: LACTIC ACID: 2.9 mmol/L — ABNORMAL HIGH (ref 0.5–2.2)

## 2023-03-16 LAB — C-REACTIVE PROTEIN(CRP),INFLAMMATION: C-REACTIVE PROTEIN (CRP): 0.5 mg/dL (ref 0.1–0.5)

## 2023-03-16 MED ORDER — IOHEXOL 350 MG IODINE/ML INTRAVENOUS SOLUTION
100.0000 mL | INTRAVENOUS | Status: AC
Start: 2023-03-16 — End: 2023-03-16
  Administered 2023-03-16: 75 mL via INTRAVENOUS

## 2023-03-16 MED ORDER — BUPRENORPHINE HCL 0.3 MG/ML INJECTION SOLUTION
INTRAMUSCULAR | Status: AC
Start: 2023-03-16 — End: 2023-03-16
  Filled 2023-03-16: qty 1

## 2023-03-16 MED ORDER — INSULIN U-100 REGULAR HUMAN 100 UNIT/ML INJECTION SOLUTION
INTRAMUSCULAR | Status: AC
Start: 2023-03-16 — End: 2023-03-16
  Filled 2023-03-16: qty 36

## 2023-03-16 MED ORDER — BUPRENORPHINE HCL 0.3 MG/ML INJECTION SOLUTION
0.1500 mg | INTRAMUSCULAR | Status: AC
Start: 2023-03-17 — End: 2023-03-16
  Administered 2023-03-16: 0.15 mg via INTRAVENOUS

## 2023-03-16 MED ORDER — INSULIN REGULAR HUMAN 100 UNIT/ML INJECTION - CHARGE BY DOSE
12.0000 [IU] | INTRAMUSCULAR | Status: AC
Start: 2023-03-16 — End: 2023-03-16
  Administered 2023-03-16: 12 [IU] via SUBCUTANEOUS

## 2023-03-16 MED ORDER — SODIUM CHLORIDE 0.9 % IV BOLUS
1000.0000 mL | INJECTION | Status: AC
Start: 2023-03-16 — End: 2023-03-17
  Administered 2023-03-16: 1000 mL via INTRAVENOUS
  Administered 2023-03-17: 0 mL via INTRAVENOUS

## 2023-03-16 NOTE — ED Triage Notes (Signed)
Upper left abdominal pain since 1400.

## 2023-03-16 NOTE — ED Nurses Note (Signed)
Informed ED provider and triage nurse of critical results at this time.

## 2023-03-16 NOTE — ED Provider Notes (Shared)
[facility]  ED Primary Provider Note        Arrival: The patient arrived by Private Vehicle     History of Present Illness   chief complaint  Gail Morris is a 57 y.o. female who had concerns including Abdominal Pain.  Patient was a 57 year old female that presents emergency room with a chief complaint of left upper abdominal pain since approximately 2:00 p.m.[name]  She rates the pain as sharp in nature.  She rates it as 8/10.  She was had nausea but no vomiting.  She states she tried to induce herself to vomit/valves but was unable to.  She was states she was not had a bowel movement nor passed any gas today.  She was states she does have history of hemorrhoids.  She was had prior cholecystectomy.  Patient is diabetic and takes Lantus 45 units q.a.m.[name]  She states "I have not check my blood sugar and a long time".  She does report having polydipsia and polyuria.  All nursing notes reviewed    Review of Systems     No other overt Review of Systems are noted to be positive except noted in the HPI.    { Be sure to review and modify ROS as appropriate. As of Oct 14, 2021 you are only required to have a "medically appropriate" ROS and PE for billing purposes. This help text will disappear when signing your note.:123}  Historical Data   History Reviewed This Encounter: Medical History  Surgical History  Family History  Social History      Physical Exam   ED Triage Vitals [03/16/23 1901]   BP (Non-Invasive) (!) 154/95   Heart Rate 95   Respiratory Rate 20   Temperature 37 C (98.6 F)   SpO2 99 %   Weight 49.9 kg (110 lb)   Height 1.575 m (5\' 2" )     {Be sure to review and modify Physical Exam as appropriate. As of Oct 14, 2021 you are only required to have a "medically appropriate" ROS and PE for billing purposes. This help text will disappear when signing your note.:123}    Exam  Constitutional:  Patient alert orient x3 in no apparent distress.  No limitations.  Head: Atraumatic  normocephalic  Eyes :  Pupils are equal round reactive to light and accommodation extraocular muscles are intact.  Sclera and conjunctiva are unremarkable  Ears:  Tympanic membranes are pearly gray bilaterally; external auditory canals are unremarkable; external ears without any lesions  Nose:  Nares are patent turbinates are pink and moist  Mouth:  Mucosa is pink and moist without lesions.  Posterior pharynx is pink and moist without hypertrophy/exudate.  Neck:  Soft and supple without palpable lymphadenopathy.  Heart:  Regular rate and rhythm without audible murmur  Lungs:  Clear to auscultation bilaterally without any wheezing/rales/rhonchi  Abdomen:  Abdomen is soft.  She was tender to deep palpation of the left upper quadrant.  No rebound or guarding.  Tympanic to percussion, good bowel sounds throughout.    Genitalia:  Deferred  Skin:  Warm and dry without lesions.  Normal skin turgor.  Brisk capillary refill distally  Extremities:  Good strength bilaterally with full range of motion of upper and lower extremities.  Neuro:  Alert oriented x3.  Cranial nerves II-XII grossly intact as tested.  Excellent sensation distally over all dermatomes.  Psychiatric:  Patient cooperative, affect appropriate, insight and judgment goodam:  Procedures           Patient Data   {Click here to open the ED Workup Activity for clinical data review *This link will automatically disappear upon signing your note*:123}  Labs Ordered/Reviewed   COMPREHENSIVE METABOLIC PANEL, NON-FASTING - Abnormal; Notable for the following components:       Result Value    SODIUM 133 (*)     CREATININE 0.59 (*)     GLUCOSE 571 (*)     ALKALINE PHOSPHATASE 220 (*)     OSMOLALITY, CALCULATED 291 (*)     All other components within normal limits    Narrative:     Estimated Glomerular Filtration Rate (eGFR) is calculated using the CKD-EPI (2021) equation, intended for patients 46 years of age and older. If gender is not documented or "unknown",  there will be no eGFR calculation.     LACTIC ACID LEVEL W/ REFLEX FOR LEVEL >2.0 - Abnormal; Notable for the following components:    LACTIC ACID 4.3 (*)     All other components within normal limits   CBC WITH DIFF - Abnormal; Notable for the following components:    HGB 15.1 (*)     HCT 43.8 (*)     LYMPHOCYTE # 3.20 (*)     All other components within normal limits   URINALYSIS, MACROSCOPIC - Abnormal; Notable for the following components:    SPECIFIC GRAVITY 1.039 (*)     GLUCOSE >1000 (*)     All other components within normal limits   URINALYSIS, MICROSCOPIC - Abnormal; Notable for the following components:    AMORPHOUS SEDIMENT Rare (*)     BUDDING YEAST Occasional (*)     All other components within normal limits   LACTIC ACID - FIRST REFLEX - Abnormal; Notable for the following components:    LACTIC ACID 2.9 (*)     All other components within normal limits   C-REACTIVE PROTEIN(CRP),INFLAMMATION - Normal   LIPASE - Normal   BETA-HYDROXYBUTYRATE, PLASMA OR SERUM - Normal   CBC/DIFF    Narrative:     The following orders were created for panel order CBC/DIFF.  Procedure                               Abnormality         Status                     ---------                               -----------         ------                     CBC WITH FAOZ[[id number]]                Abnormal            Final result                 Please view results for these tests on the individual orders.   URINALYSIS, MACROSCOPIC AND MICROSCOPIC W/CULTURE REFLEX    Narrative:     The following orders were created for panel order URINALYSIS, MACROSCOPIC AND MICROSCOPIC W/CULTURE REFLEX.  Procedure  Abnormality         Status                     ---------                               -----------         ------                     URINALYSIS, MACROSCOPIC[[id number]]      Abnormal            Final result               URINALYSIS, MICROSCOPIC[[id number]]      Abnormal            Final result                  Please view results for these tests on the individual orders.   EXTRA TUBES    Narrative:     The following orders were created for panel order EXTRA TUBES.  Procedure                               Abnormality         Status                     ---------                               -----------         ------                     BLUE TOP ZOXW[[id number]]                                    Final result                 Please view results for these tests on the individual orders.   BLUE TOP TUBE   LACTIC ACID - SECOND REFLEX       XR KUB AND UPRIGHT ABDOMEN   Final Result by Edi, Radresults In (06/02 1933)   PROMINENT VOLUME OF STOOL IN THE RIGHT COLON.            Radiologist location ID: [id number]             Medical Decision Making     ED clinical impression missing, please click on the following link to add Clinical Impressions ***  then refresh the note prior to signing.  {Be sure to fill out the MDM SmartBlock in Notewriter to the left. Do not modify this italicized text, it will disappear upon signing your note:123}  Medical Decision Making      ED Course as of 03/16/23 2332   Sun Mar 16, 2023   2141 Flat upright abdomen:FINDINGS:  Bowel gas pattern is normal.  No evidence of bowel obstruction.  No free air.     No suspicious calcifications.     There is mild degenerative changes of the spine.     There is a moderate to large volume of stool predominantly in the right colon.        IMPRESSION:  PROMINENT VOLUME OF STOOL IN THE RIGHT COLON.     2141 CBC shows a hemoglobin of 15.1 with a hematocrit of 43.8 otherwise unremarkable   2141 Sodium 133 with creatinine 0.59, glucose 571, alkaline phosphatase 220, calculated osmolality 291   2141 Lipase 40, lactic 4.3, CRP 0.5, repeat lactic 2.9, beta hydroxybutyrate 0.16   2207 Urinalysis she was gravity 1.039 with a glucosuria greater than 1000 otherwise unremarkable   2331 Repeat lactic 2.0   2332 CT abdomen pelvis with IV contrast:FINDINGS:  Lung bases: Clear      Liver:   Unremarkable.     Gallbladder:   Surgically absent.     Spleen:   Unremarkable.     Pancreas:   Unremarkable.     Adrenals:   Unremarkable.     Kidneys:   Unremarkable.     Bladder:  Unremarkable.     Uterus and Adnexa:  Unremarkable.     Bowel:   Colonic diverticulosis without diverticulitis.     Appendix:  Not identified.     Lymph nodes:  No suspicious lymph node enlargement.     Vasculature:   Moderate atherosclerosis.      Peritoneum / Retroperitoneum: No ascites.  No free air.     Bones:   Unremarkable.        IMPRESSION:  NO ACUTE FINDINGS AT THE ABDOMEN OR PELVIS ON CONTRAST-ENHANCED CT.               Medications Administered in the ED   NS bolus infusion 1,000 mL (has no administration in time range)   insulin R human 100 units/mL injection (has no administration in time range)       {Disposition:38159}    {Critical Care Time (Optional):37527}           ED clinical impression missing, please click on the following link to add Clinical Impressions ***  then refresh the note prior to signing.      Current Discharge Medication List          R.A. Shantrell Placzek, DO  Department of Emergency Medicine              {Remember to refresh your note prior to signing. Use Control + F11 or click the refresh button at the bottom of the note. This reminder text will automatically disappear when you sign your note.:123}

## 2023-03-17 LAB — POC BLOOD GLUCOSE (RESULTS): GLUCOSE, POC: 306 mg/dl — ABNORMAL HIGH (ref 70–100)

## 2023-03-17 MED ORDER — LACTULOSE 10 GRAM/15 ML (15 ML) ORAL SOLUTION
30.0000 mL | ORAL | Status: AC
Start: 2023-03-17 — End: 2023-03-17
  Administered 2023-03-17: 30 mL via ORAL

## 2023-03-17 MED ORDER — LACTULOSE 10 GRAM/15 ML (15 ML) ORAL SOLUTION
ORAL | Status: AC
Start: 2023-03-17 — End: 2023-03-17
  Filled 2023-03-17: qty 30

## 2023-03-17 MED ORDER — LACTULOSE 10 GRAM/15 ML ORAL SOLUTION
30.0000 mL | Freq: Two times a day (BID) | ORAL | 0 refills | Status: AC
Start: 2023-03-17 — End: 2023-03-27

## 2023-03-17 NOTE — Discharge Instructions (Signed)
Take lactulose twice a day as prescribed  Drink plenty of fluids  Increase dietary fiber (fruits, vegetables, whole grains products)  Closely monitor your blood sugar maintain a blood sugar log.  Follow up with the family doctor for recheck in 3-4 days with your blood sugar log to discuss adjustments to your insulin  Return to emergency room for any increased pain, nausea/vomiting, or any concerns

## 2023-03-17 NOTE — ED Nurses Note (Signed)
Patient discharged home with family.  AVS reviewed with patient/care giver.  A written copy of the AVS and discharge instructions was given to the patient/care giver.  Questions sufficiently answered as needed.  Patient/care giver encouraged to follow up with PCP as indicated.  In the event of an emergency, patient/care giver instructed to call 911 or go to the nearest emergency room.

## 2023-03-20 ENCOUNTER — Telehealth (HOSPITAL_COMMUNITY): Payer: Self-pay

## 2023-03-20 NOTE — ED Nurses Note (Signed)
First attempt to follow up with patient; left message.

## 2023-03-21 ENCOUNTER — Telehealth (HOSPITAL_COMMUNITY): Payer: Self-pay

## 2023-03-21 NOTE — ED Nurses Note (Signed)
Second attempt to follow up with patient. Left messge.

## 2023-03-25 ENCOUNTER — Telehealth (HOSPITAL_COMMUNITY): Payer: Self-pay

## 2023-03-25 NOTE — ED Nurses Note (Signed)
Third Attempt to follow up with patient; left message.

## 2023-04-22 ENCOUNTER — Encounter (HOSPITAL_BASED_OUTPATIENT_CLINIC_OR_DEPARTMENT_OTHER): Payer: Self-pay

## 2023-04-22 ENCOUNTER — Other Ambulatory Visit: Payer: Self-pay

## 2023-04-22 ENCOUNTER — Emergency Department
Admission: EM | Admit: 2023-04-22 | Discharge: 2023-04-23 | Disposition: A | Discharge status: Home / Self Care | Payer: MEDICAID | Attending: Emergency Medicine | Admitting: Emergency Medicine

## 2023-04-22 DIAGNOSIS — T85848A Pain due to other internal prosthetic devices, implants and grafts, initial encounter: Secondary | ICD-10-CM | POA: Insufficient documentation

## 2023-04-22 DIAGNOSIS — K047 Periapical abscess without sinus: Secondary | ICD-10-CM | POA: Insufficient documentation

## 2023-04-22 MED ORDER — CEPHALEXIN 500 MG CAPSULE
500.0000 mg | ORAL_CAPSULE | Freq: Three times a day (TID) | ORAL | 0 refills | Status: AC
Start: 2023-04-22 — End: 2023-05-02

## 2023-04-22 MED ORDER — KETOROLAC 30 MG/ML (1 ML) INJECTION SOLUTION
15.0000 mg | INTRAMUSCULAR | Status: AC
Start: 2023-04-23 — End: 2023-04-22
  Administered 2023-04-22: 15 mg via INTRAMUSCULAR

## 2023-04-22 MED ORDER — MICONAZOLE NITRATE 1,200 MG-2 % VAGINAL KIT
1200.0000 mg | PACK | Freq: Every day | VAGINAL | 0 refills | Status: DC
Start: 2023-04-22 — End: 2023-12-09

## 2023-04-22 MED ORDER — KETOROLAC 30 MG/ML (1 ML) INJECTION SOLUTION
INTRAMUSCULAR | Status: AC
Start: 2023-04-22 — End: 2023-04-22
  Filled 2023-04-22: qty 1

## 2023-04-22 MED ORDER — CHLORHEXIDINE GLUCONATE 0.12 % MOUTHWASH
15.0000 mL | MOUTHWASH | Freq: Two times a day (BID) | 0 refills | Status: AC
Start: 2023-04-22 — End: 2023-04-29

## 2023-04-22 MED ORDER — AMOXICILLIN 250 MG CAPSULE
ORAL_CAPSULE | ORAL | Status: AC
Start: 2023-04-22 — End: 2023-04-22
  Filled 2023-04-22: qty 2

## 2023-04-22 MED ORDER — AMOXICILLIN 500 MG CAPSULE
500.0000 mg | ORAL_CAPSULE | ORAL | Status: AC
Start: 2023-04-23 — End: 2023-04-22
  Administered 2023-04-22: 500 mg via ORAL

## 2023-04-22 NOTE — Discharge Instructions (Addendum)
Your evaluation did not reveal critical condition requiring immediate hospitalization.  Take antibiotics as prescribed and to completion.  You also have an antibiotic mouthwash.  You have treatment available for yeast infection.  You should alternate between Tylenol and ibuprofen every 4-6 hours.  You may take up to 3200 mg of ibuprofen and 4000 mg of Tylenol safely daily.  It is important that you follow up with a orthodontist ideally within next 3-5 days.  A list of area providers has been given to you.  You may also want to reach out to a larger center such as [facility] (([phone]) and [facility] (([phone] if you have complicated medical problems preventing you from accessing regular orthodontist.  Return to emergency department sooner if you have uncontrolled fevers chills pain or any other concerning symptoms.  Thank you for visiting [facility].

## 2023-04-22 NOTE — ED Triage Notes (Signed)
Patient reports her front teeth are implanted. Patients states teeth are loose and painful.

## 2023-04-23 NOTE — ED Nurses Note (Signed)
Patient discharged home with family.  AVS reviewed with patient/care giver.  A written copy of the AVS and discharge instructions was given to the patient/care giver.  Questions sufficiently answered as needed.  Patient/care giver encouraged to follow up with PCP as indicated.  In the event of an emergency, patient/care giver instructed to call 911 or go to the nearest emergency room.

## 2023-04-24 NOTE — ED Nurses Note (Signed)
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    Prior treatment history?: No    Currently enrolled in substance use program?: No    Within the last 30 days, what substances has the patient used?: Marijuana  Patient's age at first substance use?: 15-20  Drug route of administration: Smoked    Has the patient ever had sustained abstinence?: No              Family, Social, Home & Safety History  Marital Status: Divorced            Need to improve relationships with family?: No    Social network: Immediate family, Substance using peers, Non-substance using peers/friends/other    Current living situation: Independent  Any help needed with the following?: None  Contact phone number for the patient: [phone]       Has the patient had any legal issues within the past 30 days?: None         Employment  Current employment status: Disabled    Diplomatic Services operational officer?: No  Needs assistance with job search?: No    Engagement  Readiness ruler: 1  Summary of assessment priority areas: Other    Brief Intervention  Discussed plan to reduce/quit substance use?: Yes  Discussed willingness to enter treatment?: Yes  Indicated patient's stage of change:: 1 - Precontemplation    Patient seen by Peer Recovery Coach and is a candidate for buprenorphine administration in the ED. Patient needs assessment for bup treatment.: No (Non opioid user)    Plan  Was the patient referred to treatment?: No    Was patient referred to physician for Buprenorphine Assessment in the ED?: No (Non opioid user)    Did patient receive  Narcan in the ED?: No         Follow-up  Patient admitted for treatment?: No        Need for additional follow-up?: No  Additional comments: Safe use and long-twerm affects that smoking has on the lungs were discussed.    Haynes Bast, Peer Recovery Coach 04/24/2023 11:54

## 2023-04-24 NOTE — ED Provider Notes (Signed)
[facility], [facility] Emergency Department  ED Primary Provider Note  HPI:  Gail Morris is a 57 y.o. female     Patient reports dental pain ongoing for weeks and that her front teeth implants are loose.  She denies any trauma.  No difficulty swallowing changes in voice or trouble breathing no neck stiffness.  She states she was having trouble finding a Community education officer.  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  She was COVID vaccinated.  Full code.    ROS review and negative aside from stated in HPI.    Physical Exam:  ED Triage Vitals [04/22/23 2253]   BP (Non-Invasive) (!) 184/115   Heart Rate (!) 104   Respiratory Rate 16   Temperature 36.1 C (97 F)   SpO2 97 %   Weight 49.9 kg (110 lb)   Height 1.575 m (5\' 2" )     No acute distress.  Patient awake alert oriented x3.  Mood is appropriate.  Pupils 3 mm equal round reactive.  Extraocular movements are intact.  There has no trismus no changes in voice.  Most of teeth in aside from 400 missing.  Patient has dental implants which are clearly eroded and somewhat loose but none per curious.  Mucous membranes moist.  Trachea midline.  Neck is supple.  Heart has regular rate and rhythm without significant murmurs rubs or gallops.  Lungs are clear to auscultation.  Abdomen soft nontender, nondistended.  Moving all extremities without difficulty.  No rash no edema.      Patient data:  Labs Ordered/Reviewed - No data to display  No orders to display       MDM:  Dental pain.  Patient likely has a dental infection he was no discrete abscess.  She has loose implants they are not per carefully placed.  Blood pressure 154/96.  Will start on cephalexin chlorhexidine.  Was the area dental providers given.  Also provided numbers for larger centers which may be better able to handle patient's complex needs.  Strict return to ED instructions provided      Discharged  Clinical Impression   Dental  infection (Primary)   Dental implant pain     Medications Administered in the ED   ketorolac (TORADOL) 30 mg/mL injection (15 mg IntraMUSCULAR Given 04/22/23 2356)   amoxicillin (AMOXIL) capsule (500 mg Oral Given 04/22/23 2356)        Current Discharge Medication List        START taking these medications.        Details   cephalexin 500 mg Capsule  Commonly known as: KEFLEX   500 mg, Oral, 3 TIMES DAILY  Qty: 30 Capsule  Refills: 0     chlorhexidine gluconate 0.12 % Mouthwash  Commonly known as: PERIDEX   15 mL, Swish & Spit, 2 TIMES DAILY  Qty: 210 mL  Refills: 0     Miconazole Nitrate 1,200-2 mg-% Kit   1,200 mg, Vaginal, DAILY  Qty: 1 Each  Refills: 0            CONTINUE these medications - NO CHANGES were made during your visit.        Details   aspirin 81 mg Tablet, Chewable   81 mg, Oral, DAILY  Refills: 0     benzonatate 100 mg Capsule  Commonly known as: TESSALON   100 mg, Oral, EVERY 8 HOURS PRN  Qty: 12 Capsule  Refills: 0     empagliflozin 25 mg Tablet  Commonly known as: JARDIANCE   25 mg, Oral, DAILY  Refills: 0     * gabapentin 800 mg Tablet  Commonly known as: NEURONTIN   800 mg, Oral, 3 TIMES DAILY  Refills: 0     * gabapentin 100 mg Capsule  Commonly known as: NEURONTIN   100 mg, 3 TIMES DAILY  Refills: 0     guaiFENesin 600 mg Tablet Extended Release 12hr  Commonly known as: MUCINEX   600 mg, Oral, EVERY 12 HOURS  Refills: 0     insulin glargine 100 unit/mL injection (vial)   40 Units, Subcutaneous, NIGHTLY  Refills: 0     ramipriL 10 mg Capsule  Commonly known as: ALTACE   10 mg, Oral, DAILY  Refills: 0           * This list has 2 medication(s) that are the same as other medications prescribed for you. Read the directions carefully, and ask your doctor or other care provider to review them with you.                ASK your doctor about these medications.        Details   lactulose 10 gram/15 mL Solution  Commonly known as: ENULOSE  Ask about: Should I take this medication?   30 mL, Oral, 2 TIMES  DAILY  Qty: 600 mL  Refills: 0

## 2023-05-11 ENCOUNTER — Other Ambulatory Visit: Payer: Self-pay

## 2023-05-11 ENCOUNTER — Inpatient Hospital Stay (HOSPITAL_COMMUNITY): Payer: MEDICAID

## 2023-05-11 ENCOUNTER — Emergency Department
Admission: EM | Admit: 2023-05-11 | Discharge: 2023-05-12 | Disposition: A | Discharge status: Home / Self Care | Payer: MEDICAID | Attending: Emergency Medicine | Admitting: Emergency Medicine

## 2023-05-11 ENCOUNTER — Encounter (HOSPITAL_COMMUNITY): Payer: Self-pay | Admitting: Emergency Medicine

## 2023-05-11 ENCOUNTER — Emergency Department (HOSPITAL_COMMUNITY): Payer: MEDICAID

## 2023-05-11 DIAGNOSIS — M25511 Pain in right shoulder: Secondary | ICD-10-CM | POA: Insufficient documentation

## 2023-05-11 DIAGNOSIS — R7989 Other specified abnormal findings of blood chemistry: Secondary | ICD-10-CM | POA: Insufficient documentation

## 2023-05-11 DIAGNOSIS — R202 Paresthesia of skin: Secondary | ICD-10-CM | POA: Insufficient documentation

## 2023-05-11 DIAGNOSIS — M19011 Primary osteoarthritis, right shoulder: Secondary | ICD-10-CM | POA: Insufficient documentation

## 2023-05-11 DIAGNOSIS — M4722 Other spondylosis with radiculopathy, cervical region: Secondary | ICD-10-CM | POA: Insufficient documentation

## 2023-05-11 DIAGNOSIS — Z86718 Personal history of other venous thrombosis and embolism: Secondary | ICD-10-CM | POA: Insufficient documentation

## 2023-05-11 DIAGNOSIS — M79601 Pain in right arm: Secondary | ICD-10-CM

## 2023-05-11 DIAGNOSIS — M5412 Radiculopathy, cervical region: Secondary | ICD-10-CM

## 2023-05-11 LAB — COMPREHENSIVE METABOLIC PANEL, NON-FASTING
ALBUMIN/GLOBULIN RATIO: 1.1 (ref 0.8–1.4)
ALBUMIN: 4.3 g/dL (ref 3.5–5.7)
ALKALINE PHOSPHATASE: 139 U/L — ABNORMAL HIGH (ref 34–104)
ALT (SGPT): 23 U/L (ref 7–52)
ANION GAP: 9 mmol/L (ref 4–13)
AST (SGOT): 24 U/L (ref 13–39)
BILIRUBIN TOTAL: 0.4 mg/dL (ref 0.3–1.0)
BUN/CREA RATIO: 16 (ref 6–22)
BUN: 8 mg/dL (ref 7–25)
CALCIUM, CORRECTED: 9.7 mg/dL (ref 8.9–10.8)
CALCIUM: 9.9 mg/dL (ref 8.6–10.3)
CHLORIDE: 101 mmol/L (ref 98–107)
CO2 TOTAL: 26 mmol/L (ref 21–31)
CREATININE: 0.51 mg/dL — ABNORMAL LOW (ref 0.60–1.30)
ESTIMATED GFR: 110 mL/min/{1.73_m2} (ref >59)
GLOBULIN: 3.8 (ref 2.9–5.4)
GLUCOSE: 311 mg/dL — ABNORMAL HIGH (ref 74–109)
OSMOLALITY, CALCULATED: 282 mOsm/kg (ref 270–290)
POTASSIUM: 4.2 mmol/L (ref 3.5–5.1)
PROTEIN TOTAL: 8.1 g/dL (ref 6.4–8.9)
SODIUM: 136 mmol/L (ref 136–145)

## 2023-05-11 LAB — CBC WITH DIFF
BASOPHIL #: 0.1 10*3/uL (ref 0.00–0.10)
BASOPHIL %: 1 % (ref 0–1)
EOSINOPHIL #: 0.1 10*3/uL (ref 0.00–0.50)
EOSINOPHIL %: 1 % (ref 1–7)
HCT: 44.3 % — ABNORMAL HIGH (ref 31.2–41.9)
HGB: 15.4 g/dL — ABNORMAL HIGH (ref 10.9–14.3)
LYMPHOCYTE #: 3.1 10*3/uL — ABNORMAL HIGH (ref 1.00–3.00)
LYMPHOCYTE %: 35 % (ref 16–44)
MCH: 31.5 pg (ref 24.7–32.8)
MCHC: 34.7 g/dL (ref 32.3–35.6)
MCV: 90.9 fL (ref 75.5–95.3)
MONOCYTE #: 0.5 10*3/uL (ref 0.30–1.00)
MONOCYTE %: 5 % (ref 5–13)
MPV: 7.6 fL — ABNORMAL LOW (ref 7.9–10.8)
NEUTROPHIL #: 5.2 10*3/uL (ref 1.85–7.80)
NEUTROPHIL %: 58 % (ref 43–77)
PLATELETS: 290 10*3/uL (ref 140–440)
RBC: 4.87 10*6/uL (ref 3.63–4.92)
RDW: 13.6 % (ref 12.3–17.7)
WBC: 9 10*3/uL (ref 3.8–11.8)

## 2023-05-11 LAB — LACTIC ACID LEVEL W/ REFLEX FOR LEVEL >2.0: LACTIC ACID: 1.7 mmol/L (ref 0.5–2.2)

## 2023-05-11 LAB — PT/INR
INR: 1 (ref 0.84–1.10)
PROTHROMBIN TIME: 11.7 seconds (ref 9.8–12.7)

## 2023-05-11 LAB — D-DIMER: D-DIMER: 594 ng/mL FEU (ref 215–500)

## 2023-05-11 LAB — MAGNESIUM: MAGNESIUM: 1.7 mg/dL — ABNORMAL LOW (ref 1.9–2.7)

## 2023-05-11 LAB — PTT (PARTIAL THROMBOPLASTIN TIME): APTT: 29.6 seconds (ref 25.0–38.0)

## 2023-05-11 NOTE — ED Triage Notes (Signed)
Right shoulder pain x 4 days

## 2023-05-11 NOTE — ED Provider Notes (Signed)
[facility]  ED Primary Provider Note        Arrival: The patient arrived by Private Vehicle     History of Present Illness   chief complaint  Gail Morris is a 56 y.o. female who had concerns including Shoulder Pain.  Patient is a 57 year old female that presents emergency room with a chief complaint of pain in the right clavicular region.  Patient was states she was had prior clots in his area.  Patient is reporting numbness and tingling to the fingers of the right hand.  She is on no antiplatelet/anticoagulation.  Patient also reports recent dental surgery.  Patient was does continue to smoke 1 pack per day.  She does have brisk capillary refill to the.  Radial pulses +2.  All nursing notes reviewed        Review of Systems     No other overt Review of Systems are noted to be positive except noted in the HPI.      Historical Data   History Reviewed This Encounter: Medical History  Surgical History  Family History  Social History      Physical Exam   ED Triage Vitals [05/11/23 2035]   BP (Non-Invasive) (!) 158/99   Heart Rate 84   Respiratory Rate 16   Temperature 37.4 C (99.3 F)   SpO2 100 %   Weight 49.9 kg (110 lb)   Height 1.575 m (5\' 2" )         Exam:   Constitutional:  Patient alert orient x3 in no apparent distress.  No limitations.  Head: Atraumatic normocephalic  Eyes :  Pupils are equal round reactive to light and accommodation extraocular muscles are intact.  Sclera and conjunctiva are unremarkable  Ears:  Tympanic membranes are pearly gray bilaterally; external auditory canals are unremarkable; external ears without any lesions  Nose:  Nares are patent turbinates are pink and moist  Mouth:  Mucosa is pink and moist without lesions.  Posterior pharynx is pink and moist without hypertrophy/exudate.  Neck:  Soft and supple without palpable lymphadenopathy.  Heart:  Regular rate and rhythm with a 1/6 holosystolic ejection murmur  Lungs:  Clear to auscultation  bilaterally without any wheezing/rales/rhonchi  Abdomen:  Soft nontender without any rebound or guarding; positive bowel sounds throughout  Genitalia:  Deferred  Skin:  Warm and dry without lesions.  Normal skin turgor.  Brisk capillary refill distally  Extremities:  Patient is tender to palpation in the right supraclavicular region.  Painful range of motion with abduction/elevation of right arm at shoulder.  Pain is also reproduced in the right shoulder/supraclavicular region with rotation of head to the left.  Brisk capillary distally.  Good sensation over all dermatomes  Neuro:  Alert oriented x3.  Cranial nerves II-XII grossly intact as tested.  Excellent sensation distally over all dermatomes.  Psychiatric:  Patient cooperative, affect appropriate, insight and judgment good          Procedures           Patient Data     Labs Ordered/Reviewed   COMPREHENSIVE METABOLIC PANEL, NON-FASTING - Abnormal; Notable for the following components:       Result Value    CREATININE 0.51 (*)     GLUCOSE 311 (*)     ALKALINE PHOSPHATASE 139 (*)     All other components within normal limits    Narrative:     Estimated Glomerular Filtration Rate (eGFR) is calculated using the CKD-EPI (  2021) equation, intended for patients 48 years of age and older. If gender is not documented or "unknown", there will be no eGFR calculation.     D-DIMER - Abnormal; Notable for the following components:    D-DIMER 594 (*)     All other components within normal limits    Narrative:     D-Dimers are reported in FEU per ng/mL.    IF PATIENT IS EXHIBITING SYMPTOMS DVT/PE, THIS D-DIMER RESULT MAY INDICATE A NEED FOR FURTHER TESTING FOR THESE CONDITIONS. IF PATIENT IS SUSPECTED OF DIC AND SYMPTOMS WORSEN OR PERSIST, A REPEAT DIC WORKUP SHOULD BE CONSIDERED.    NOTE: ALTHOUGH THE NORMAL RANGE FOR THIS TEST IS 215-500 ng/mL FEU, LITERATURE RECOMMENDS FURTHER TESTING FOR ANY RESULT >500ng/mL FEU.    A cut off value of 500ng/mL FEU or below can be used as an  aid in the diagnosis of Thromboembolism when used in conjunction with the patient's medical history, clinical presentation and other findings. Results of 500ng/mL FEU or below have a negative predictive value of 100%.     MAGNESIUM - Abnormal; Notable for the following components:    MAGNESIUM 1.7 (*)     All other components within normal limits   CBC WITH DIFF - Abnormal; Notable for the following components:    HGB 15.4 (*)     HCT 44.3 (*)     MPV 7.6 (*)     LYMPHOCYTE # 3.10 (*)     All other components within normal limits   PT/INR - Normal    Narrative:     INR OF 2.0-3.0  RECOMMENDED FOR: PROPHYLAXIS/TREATMENT OF VENEOUS THROMBOSIS, PULMONARY EMBOLISM, PREVENTION OF SYSTEMIC EMBOLISM FROM ATRIAL FIBRILATION, MYOCARDIAL INFARCTION.    INR OF 2.5-3.5  RECOMMENDED FOR MECHANICAL PROSTHETIC HEART VALVES, RECURRENT SYSTEMIC EMBOLISM, RECURRENT MYOCARDIAL INFARCTION.     PTT (PARTIAL THROMBOPLASTIN TIME) - Normal   LACTIC ACID LEVEL W/ REFLEX FOR LEVEL >2.0 - Normal   CBC/DIFF    Narrative:     The following orders were created for panel order CBC/DIFF.  Procedure                               Abnormality         Status                     ---------                               -----------         ------                     CBC WITH ZOXW[[id number]]                Abnormal            Final result                 Please view results for these tests on the individual orders.   EXTRA TUBES    Narrative:     The following orders were created for panel order EXTRA TUBES.  Procedure                               Abnormality         Status                     ---------                               -----------         ------  GOLD TOP GLOV[[id number]]                                    In process                   Please view results for these tests on the individual orders.   GOLD TOP TUBE       CT CERVICAL SPINE WO IV CONTRAST   Final Result by Edi, Radresults In (07/28 2130)   NO ACUTE CERVICAL  FRACTURE.  DEGENERATIVE CHANGES.         One or more dose reduction techniques were used (e.g., Automated exposure control, adjustment of the mA and/or kV according to patient size, use of iterative reconstruction technique).         Radiologist location ID: [id number]         XR SHOULDER RIGHT   Final Result by Edi, Radresults In (07/28 2056)   DEGENERATIVE OSTEOARTHROSIS. NO ACUTE FINDINGS. Possible calcific bursitis               Radiologist location ID: [id number]             Medical Decision Making          Medical Decision Making  CT of cervical spine does show some degenerative changes.  Three-view shoulder shows degenerative changes.  No deformity, no dislocation.  Duplex ultrasound of right upper extremity shows no deep vein thrombosis.  Patient was laboratory workup did show D-dimer of 594 and magnesium 1.7 otherwise unremarkable.  Patient was findings are consistent with cervical radiculopathy.  Patient was given Lortab 5 here in emergency room.  Will discharge her home on naproxen 500 b.i.d..  Patient was be advised to follow up with the primary care provider for recheck in next 2-3 days.  He was symptoms become worse patient was may need to have an MRI.  See discharge instructions for detailed    Amount and/or Complexity of Data Reviewed  Labs: ordered. Decision-making details documented in ED Course.  Radiology: ordered and independent interpretation performed. Decision-making details documented in ED Course.    Risk  Prescription drug management.    Critical Care  Total time providing critical care: 0 minutes        ED Course as of 05/12/23 0158   Sun May 11, 2023   2118 Three-view right shoulder shows no fracture, no deformity, no dislocation.  There are surgical clips noted in the right lateral chest wall.   2142 CT of cervical spine shows degenerative changes.  No fracture, no deformity   2332 Hemoglobin 15.4 with a hematocrit of 44.3 otherwise CBC unremarkable   2333 Glucose 311 with an  alkaline phosphatase of 139 otherwise CMP unremarkable   2333 Magnesium 1.7, lactic 1.7   2333 PT/INR/PTT are normal range   2333 D-dimer elevated at 594   2335 Due to patient was prior history of prior subclavian deep vein thrombosis and elevated D-dimer.  Duplex ultrasound of right upper extremity has been ordered.   Mon May 12, 2023   0145 Duplex right upper extremity; no deep vein thrombosis   0145 Review of board of Pharmacy shows patient was chronically on Neurontin.  No other controlled prescriptions         Medications Administered in the ED   HYDROcodone-acetaminophen (NORCO) 5-325 mg per tablet (1 Tablet Oral Given 05/12/23 0109)  Following the history, physical exam, and ED workup, the patient was deemed stable and suitable for discharge. The patient/caregiver was advised to return to the ED for any new or worsening symptoms. Discharge medications, and follow-up instructions were discussed with the patient/caregiver in detail, who verbalizes understanding. The patient/caregiver is in agreement and is comfortable with the plan of care.    Disposition: Discharged         Current Discharge Medication List        START taking these medications.        Details   naproxen 500 mg Tablet  Commonly known as: NAPROSYN   500 mg, Oral, EVERY 12 HOURS PRN  Qty: 20 Tablet  Refills: 0            CONTINUE these medications - NO CHANGES were made during your visit.        Details   aspirin 81 mg Tablet, Chewable   81 mg, Oral, DAILY  Refills: 0     benzonatate 100 mg Capsule  Commonly known as: TESSALON   100 mg, Oral, EVERY 8 HOURS PRN  Qty: 12 Capsule  Refills: 0     empagliflozin 25 mg Tablet  Commonly known as: JARDIANCE   25 mg, Oral, DAILY  Refills: 0     * gabapentin 800 mg Tablet  Commonly known as: NEURONTIN   800 mg, Oral, 3 TIMES DAILY  Refills: 0     * gabapentin 100 mg Capsule  Commonly known as: NEURONTIN   100 mg, 3 TIMES DAILY  Refills: 0     guaiFENesin 600 mg Tablet Extended Release 12hr  Commonly  known as: MUCINEX   600 mg, Oral, EVERY 12 HOURS  Refills: 0     insulin glargine 100 unit/mL injection (vial)   40 Units, Subcutaneous, NIGHTLY  Refills: 0     Miconazole Nitrate 1,200-2 mg-% Kit   1,200 mg, Vaginal, DAILY  Qty: 1 Each  Refills: 0     ramipriL 10 mg Capsule  Commonly known as: ALTACE   10 mg, Oral, DAILY  Refills: 0           * This list has 2 medication(s) that are the same as other medications prescribed for you. Read the directions carefully, and ask your doctor or other care provider to review them with you.                ASK your doctor about these medications.        Details   cephalexin 500 mg Capsule  Commonly known as: KEFLEX  Ask about: Should I take this medication?   500 mg, Oral, 3 TIMES DAILY  Qty: 30 Capsule  Refills: 0     chlorhexidine gluconate 0.12 % Mouthwash  Commonly known as: PERIDEX  Ask about: Should I take this medication?   15 mL, Swish & Spit, 2 TIMES DAILY  Qty: 210 mL  Refills: 0            Follow up:   Mariah Milling, DO  [address]  [place] [address]  [phone]    In 3 days                   Clinical Impression   Right upper limb pain   Cervical radiculopathy (Primary)   Paresthesia of right upper extremity   Right shoulder pain         Current Discharge Medication List  START taking these medications    Details   naproxen (NAPROSYN) 500 mg Oral Tablet Take 1 Tablet (500 mg total) by mouth Every 12 hours as needed for Pain  Qty: 20 Tablet, Refills: 0             R.A. Santiago Bumpers, DO  Department of Emergency Medicine

## 2023-05-12 ENCOUNTER — Emergency Department (HOSPITAL_COMMUNITY): Payer: MEDICAID

## 2023-05-12 LAB — GOLD TOP TUBE

## 2023-05-12 MED ORDER — NAPROXEN 500 MG TABLET
500.0000 mg | ORAL_TABLET | Freq: Two times a day (BID) | ORAL | 0 refills | Status: DC
Start: 2023-05-12 — End: 2023-05-12

## 2023-05-12 MED ORDER — NAPROXEN 500 MG TABLET
500.0000 mg | ORAL_TABLET | Freq: Two times a day (BID) | ORAL | 0 refills | Status: DC | PRN
Start: 2023-05-12 — End: 2023-12-09

## 2023-05-12 MED ORDER — HYDROCODONE 5 MG-ACETAMINOPHEN 325 MG TABLET
ORAL_TABLET | ORAL | Status: AC
Start: 2023-05-12 — End: 2023-05-12
  Filled 2023-05-12: qty 1

## 2023-05-12 MED ORDER — HYDROCODONE 5 MG-ACETAMINOPHEN 325 MG TABLET
1.0000 | ORAL_TABLET | ORAL | Status: AC
Start: 2023-05-12 — End: 2023-05-12
  Administered 2023-05-12: 1 via ORAL

## 2023-05-12 NOTE — ED Nurses Note (Signed)
Patient discharged home with family.  AVS reviewed with patient/care giver.  A written copy of the AVS and discharge instructions was given to the patient/care giver.  Questions sufficiently answered as needed.  Patient/care giver encouraged to follow up with PCP as indicated.  In the event of an emergency, patient/care giver instructed to call 911 or go to the nearest emergency room.

## 2023-05-12 NOTE — Discharge Instructions (Signed)
Take naproxen every 12 hours as needed for discomfort  Follow up with family doctor for recheck in 2-3 days  Return to emergency room for any increased pain, numbness, tingling, or any concerns

## 2023-06-03 ENCOUNTER — Inpatient Hospital Stay
Admission: RE | Admit: 2023-06-03 | Discharge: 2023-06-03 | Disposition: A | Discharge status: Home / Self Care | Payer: MEDICAID | Source: Ambulatory Visit | Attending: Family Medicine | Admitting: Family Medicine

## 2023-06-03 ENCOUNTER — Other Ambulatory Visit: Payer: Self-pay

## 2023-06-03 DIAGNOSIS — Z853 Personal history of malignant neoplasm of breast: Secondary | ICD-10-CM

## 2023-07-05 ENCOUNTER — Encounter (HOSPITAL_COMMUNITY): Payer: Self-pay | Admitting: Emergency Medicine

## 2023-07-05 ENCOUNTER — Other Ambulatory Visit: Payer: Self-pay

## 2023-07-05 ENCOUNTER — Emergency Department
Admission: EM | Admit: 2023-07-05 | Discharge: 2023-07-05 | Disposition: A | Discharge status: Home / Self Care | Payer: MEDICAID | Source: Home / Self Care | Attending: Emergency Medicine | Admitting: Emergency Medicine

## 2023-07-05 ENCOUNTER — Emergency Department (HOSPITAL_COMMUNITY): Payer: MEDICAID

## 2023-07-05 DIAGNOSIS — E1165 Type 2 diabetes mellitus with hyperglycemia: Secondary | ICD-10-CM | POA: Insufficient documentation

## 2023-07-05 DIAGNOSIS — R739 Hyperglycemia, unspecified: Secondary | ICD-10-CM

## 2023-07-05 DIAGNOSIS — R5381 Other malaise: Secondary | ICD-10-CM

## 2023-07-05 DIAGNOSIS — Z794 Long term (current) use of insulin: Secondary | ICD-10-CM | POA: Insufficient documentation

## 2023-07-05 DIAGNOSIS — Z1152 Encounter for screening for COVID-19: Secondary | ICD-10-CM | POA: Insufficient documentation

## 2023-07-05 LAB — MANUAL DIFFERENTIAL
BAND %: 1 % — ABNORMAL LOW (ref 5–11)
BANDS NEUTROPHILS MANUAL: 1
LYMPHOCYTE %: 25 % (ref 25–45)
LYMPHOCYTE ABSOLUTE: 2.58 10*3/uL (ref 1.10–5.00)
LYMPHOCYTES MANUAL: 25
MONOCYTE %: 1 % (ref 0–12)
MONOCYTE ABSOLUTE: 0.1 10*3/uL (ref 0.00–1.30)
MONOCYTES MANUAL: 1
NEUTROPHIL %: 73 % (ref 40–76)
NEUTROPHIL ABSOLUTE: 7.62 10*3/uL (ref 1.80–8.40)
NEUTROPHILS MANUAL: 73
PLATELET MORPHOLOGY COMMENT: NORMAL
RBC MORPHOLOGY COMMENT: NORMAL
TOTAL CELLS COUNTED [#] IN BLOOD: 100
WBC: 10.3 10*3/uL

## 2023-07-05 LAB — COMPREHENSIVE METABOLIC PANEL, NON-FASTING
ALBUMIN/GLOBULIN RATIO: 1.3 (ref 0.8–1.4)
ALBUMIN: 4.6 g/dL (ref 3.5–5.7)
ALKALINE PHOSPHATASE: 280 U/L — ABNORMAL HIGH (ref 34–104)
ALT (SGPT): 12 U/L (ref 7–52)
ANION GAP: 11 mmol/L (ref 4–13)
AST (SGOT): 16 U/L (ref 13–39)
BILIRUBIN TOTAL: 0.4 mg/dL (ref 0.3–1.0)
BUN/CREA RATIO: 10 (ref 6–22)
BUN: 5 mg/dL — ABNORMAL LOW (ref 7–25)
CALCIUM, CORRECTED: 9.7 mg/dL (ref 8.9–10.8)
CALCIUM: 10.2 mg/dL (ref 8.6–10.3)
CHLORIDE: 98 mmol/L (ref 98–107)
CO2 TOTAL: 25 mmol/L (ref 21–31)
CREATININE: 0.51 mg/dL — ABNORMAL LOW (ref 0.60–1.30)
ESTIMATED GFR: 110 mL/min/{1.73_m2} (ref >59)
GLOBULIN: 3.6 (ref 2.9–5.4)
GLUCOSE: 435 mg/dL — ABNORMAL HIGH (ref 74–109)
OSMOLALITY, CALCULATED: 284 mOsm/kg (ref 270–290)
POTASSIUM: 4 mmol/L (ref 3.5–5.1)
PROTEIN TOTAL: 8.2 g/dL (ref 6.4–8.9)
SODIUM: 134 mmol/L — ABNORMAL LOW (ref 136–145)

## 2023-07-05 LAB — CBC WITH DIFF
HCT: 48.4 % — ABNORMAL HIGH (ref 31.2–41.9)
HGB: 16.7 g/dL — ABNORMAL HIGH (ref 10.9–14.3)
MCH: 31.3 pg (ref 24.7–32.8)
MCHC: 34.5 g/dL (ref 32.3–35.6)
MCV: 90.7 fL (ref 75.5–95.3)
MPV: 8.3 fL (ref 7.9–10.8)
PLATELETS: 261 10*3/uL (ref 140–440)
RBC: 5.33 10*6/uL — ABNORMAL HIGH (ref 3.63–4.92)
RDW: 13.7 % (ref 12.3–17.7)
WBC: 10.3 10*3/uL (ref 3.8–11.8)

## 2023-07-05 LAB — ARTERIAL BLOOD GAS/LACTATE
%FIO2 (ARTERIAL): 21 %
BASE DEFICIT: 0.3 mmol/L (ref 0.0–2.0)
BICARBONATE (ARTERIAL): 24.3 mmol/L (ref 20.0–26.0)
CARBOXYHEMOGLOBIN: 4.8 % — ABNORMAL HIGH (ref <=1.5)
LACTATE: 1.6 mmol/L (ref <=2.0)
MET-HEMOGLOBIN: 0.2 % (ref <=2.0)
O2CT: 18.5 %
OXYHEMOGLOBIN: 90.7 % (ref 88.0–100.0)
PCO2 (ARTERIAL): 38 mm/Hg (ref 35–45)
PH (ARTERIAL): 7.41 (ref 7.35–7.45)
PO2 (ARTERIAL): 75 mm/Hg — ABNORMAL LOW (ref 80–100)

## 2023-07-05 LAB — POC BLOOD GLUCOSE (RESULTS): GLUCOSE, POC: 301 mg/dl — ABNORMAL HIGH (ref 70–100)

## 2023-07-05 LAB — COVID-19, FLU A/B, RSV RAPID BY PCR
INFLUENZA VIRUS TYPE A: NOT DETECTED
INFLUENZA VIRUS TYPE B: NOT DETECTED
RESPIRATORY SYNCTIAL VIRUS (RSV): NOT DETECTED
SARS-CoV-2: NOT DETECTED

## 2023-07-05 LAB — URINALYSIS, MACROSCOPIC
BILIRUBIN: NEGATIVE mg/dL
BLOOD: NEGATIVE mg/dL
GLUCOSE: >1000 mg/dL — AB
KETONES: NEGATIVE mg/dL
LEUKOCYTES: NEGATIVE WBCs/uL
NITRITE: NEGATIVE
PH: 7 (ref 5.0–9.0)
PROTEIN: NEGATIVE mg/dL
SPECIFIC GRAVITY: 1.045 — ABNORMAL HIGH (ref 1.002–1.030)
UROBILINOGEN: NORMAL mg/dL

## 2023-07-05 LAB — TROPONIN-I
TROPONIN I: 4 ng/L (ref <15)
TROPONIN I: 6 ng/L (ref <15)

## 2023-07-05 LAB — URINALYSIS, MICROSCOPIC
RBCS: 1 /hpf (ref <4)
SQUAMOUS EPITHELIAL: 1 /hpf (ref <28)

## 2023-07-05 LAB — LACTIC ACID LEVEL W/ REFLEX FOR LEVEL >2.0: LACTIC ACID: 3.2 mmol/L — ABNORMAL HIGH (ref 0.5–2.2)

## 2023-07-05 LAB — THYROID STIMULATING HORMONE (SENSITIVE TSH): TSH: 1.972 u[IU]/mL (ref 0.450–5.330)

## 2023-07-05 MED ORDER — SODIUM CHLORIDE 0.9 % IV BOLUS
2000.0000 mL | INJECTION | Status: AC
Start: 2023-07-05 — End: 2023-07-05
  Administered 2023-07-05: 2000 mL via INTRAVENOUS
  Administered 2023-07-05: 0 mL via INTRAVENOUS

## 2023-07-05 MED ORDER — INSULIN U-100 REGULAR HUMAN 100 UNIT/ML INJECTION SOLUTION
INTRAMUSCULAR | Status: AC
Start: 2023-07-05 — End: 2023-07-05
  Filled 2023-07-05: qty 3

## 2023-07-05 MED ORDER — INSULIN REGULAR HUMAN 100 UNIT/ML INJECTION - CHARGE BY DOSE
5.0000 [IU] | INTRAMUSCULAR | Status: AC
Start: 2023-07-05 — End: 2023-07-05
  Administered 2023-07-05: 5 [IU] via SUBCUTANEOUS

## 2023-07-05 NOTE — Discharge Instructions (Signed)
Thank you for allowing [place] to be part of your care.    Please follow-up your abnormal x-ray with her primary care provider.  Please discuss your diabetic regimen with your primary care provider.    Please discuss all medications with your pharmacist to ensure there are no concerns of interactions.    Please ensure all questions or concerns are addressed prior to leaving the hospital. We want to make sure your concerns are addressed to make sure you are as safe and healthy as possible. By leaving the hospital, it is understood you are in agreement with your treatment plan.    You may have received sedating medication during your visit. Please discuss this with your discharging provider nurse as you may not be able to operate machines while the medication is in your system, or while you are taking any potentially sedating prescriptions.    Please call the hospital medical records office for a copy of your finalized results, and review them with a primary care physician, for any findings needing further attention.    If you feel your situation worsens, or does not get better in 48 hours, please see a physician for evaluation.    We encourage you to see your regular doctor as soon as possible to let them know you were seen in the emergency department. They may want to do further testing. If you do not have a doctor, please feel free to call the hospital, and ask for contact information of accepting providers. Please also discuss your vaccinations, and ensure all are up to date.    You may use this document to take today off work or school.

## 2023-07-05 NOTE — ED Nurses Note (Signed)
Patient discharged home with family.  AVS reviewed with patient/care giver.  A written copy of the AVS and discharge instructions was given to the patient/care giver.  Questions sufficiently answered as needed.  Patient/care giver encouraged to follow up with PCP as indicated.  In the event of an emergency, patient/care giver instructed to call 911 or go to the nearest emergency room.

## 2023-07-05 NOTE — ED Provider Notes (Signed)
[facility]  ED Primary Provider Note  Patient Name: Gail Morris  Patient Age: 57 y.o.  Date of Birth: 30-Oct-1965    Chief Complaint: Weakness        History of Present Illness       Gail Morris is a 57 y.o. female who had concerns including Weakness.  This patient is a 57 year old female who presents with weakness.  Patient states she has been having decreased energy, and decreased appetite since Tuesday.  She was a type 2 diabetic, and does use insulin.  Chart review also shows she is prescribed Jardiance.        Review of Systems     No other overt Review of Systems are noted to be positive except noted in the HPI.      Historical Data   History Reviewed This Encounter: Medical History  Surgical History  Family History  Social History      Physical Exam   ED Triage Vitals [07/05/23 1913]   BP (Non-Invasive) (!) 144/81   Heart Rate 87   Respiratory Rate 16   Temperature 36.8 C (98.2 F)   SpO2 100 %   Weight 49.9 kg (110 lb)   Height 1.575 m (5\' 2" )         Nursing notes reviewed for what could be assessed. Past Medical, Surgical, and Social history reviewed for what has been completed.    Constitutional: NAD. Well-Developed. Well Nourished.  Head: Normocephalic, atraumatic.  Mouth/Throat:  Symmetric facial movement.  Eyes: EOM grossly intact, conjunctiva normal.  Neck: Supple  Cardiovascular:  Non tachycardic Rate and Rhythm, extremities well perfused.  Pulmonary/Chest: No respiratory distress.  Abdominal: Soft, non-tender, non-distended. Non peritoneal, no rebound, no guarding.  MSK: No Lower Extremity Edema.  Skin: Warm, dry, and intact  Neuro: Appropriate, CN II-XII grossly intact. Gait not ataxic.  Psych: Pleasant              Procedures      Patient Data     Labs Ordered/Reviewed   COMPREHENSIVE METABOLIC PANEL, NON-FASTING - Abnormal; Notable for the following components:       Result Value    SODIUM 134 (*)     BUN 5 (*)     CREATININE 0.51 (*)     GLUCOSE 435 (*)      ALKALINE PHOSPHATASE 280 (*)     All other components within normal limits    Narrative:     Estimated Glomerular Filtration Rate (eGFR) is calculated using the CKD-EPI (2021) equation, intended for patients 75 years of age and older. If gender is not documented or "unknown", there will be no eGFR calculation.     LACTIC ACID LEVEL W/ REFLEX FOR LEVEL >2.0 - Abnormal; Notable for the following components:    LACTIC ACID 3.2 (*)     All other components within normal limits   CBC WITH DIFF - Abnormal; Notable for the following components:    RBC 5.33 (*)     HGB 16.7 (*)     HCT 48.4 (*)     All other components within normal limits   URINALYSIS, MACROSCOPIC - Abnormal; Notable for the following components:    SPECIFIC GRAVITY 1.045 (*)     GLUCOSE >1000 (*)     All other components within normal limits   MANUAL DIFFERENTIAL - Abnormal; Notable for the following components:    BAND % 1 (*)     All other components within  normal limits   ARTERIAL BLOOD GAS/LACTATE - Abnormal; Notable for the following components:    CARBOXYHEMOGLOBIN 4.8 (*)     PO2 (ARTERIAL) 75 (*)     All other components within normal limits    Narrative:     .  [name]   POC BLOOD GLUCOSE (RESULTS) - Abnormal; Notable for the following components:    GLUCOSE, POC 301 (*)     All other components within normal limits   TROPONIN-I - Normal   COVID-19, FLU A/B, RSV RAPID BY PCR - Normal    Narrative:     Results are for the simultaneous qualitative identification of SARS-CoV-2 (formerly 2019-nCoV), Influenza A, Influenza B, and RSV RNA. These etiologic agents are generally detectable in nasopharyngeal and nasal swabs during the ACUTE PHASE of infection. Hence, this test is intended to be performed on respiratory specimens collected from individuals with signs and symptoms of upper respiratory tract infection who meet Centers for Disease Control and Prevention (CDC) clinical and/or epidemiological criteria for Coronavirus Disease 2019 (COVID-19)  testing. CDC COVID-19 criteria for testing on human specimens is available at [facility] webpage information for Healthcare Professionals: Coronavirus Disease 2019 (COVID-19) ([email or web address]).     False-negative results may occur if the virus has genomic mutations, insertions, deletions, or rearrangements or if performed very early in the course of illness. Otherwise, negative results indicate virus specific RNA targets are not detected, however negative results do not preclude SARS-CoV-2 infection/COVID-19, Influenza, or Respiratory syncytial virus infection. Results should not be used as the sole basis for patient management decisions. Negative results must be combined with clinical observations, patient history, and epidemiological information. If upper respiratory tract infection is still suspected based on exposure history together with other clinical findings, re-testing should be considered.    Test methodology:   Cepheid Xpert Xpress SARS-CoV-2/Flu/RSV Assay real-time polymerase chain reaction (RT-PCR) test on the GeneXpert Dx and Xpert Xpress systems.   THYROID STIMULATING HORMONE (SENSITIVE TSH) - Normal   TROPONIN-I - Normal   CBC/DIFF    Narrative:     The following orders were created for panel order CBC/DIFF.  Procedure                               Abnormality         Status                     ---------                               -----------         ------                     CBC WITH LKGM[[id number]]                Abnormal            Final result               MANUAL DIFFERENTIAL[[id number]]          Abnormal            Final result                 Please view results for these tests on the individual orders.   URINALYSIS, MACROSCOPIC AND MICROSCOPIC W/CULTURE REFLEX    Narrative:     The  following orders were created for panel order URINALYSIS, MACROSCOPIC AND MICROSCOPIC W/CULTURE REFLEX [PRN ONLY].  Procedure                               Abnormality          Status                     ---------                               -----------         ------                     URINALYSIS, MACROSCOPIC[[id number]]      Abnormal            Final result               URINALYSIS, MICROSCOPIC[[id number]]                          Final result                 Please view results for these tests on the individual orders.   URINALYSIS, MICROSCOPIC       XR CHEST AP AND LATERAL   Final Result by Edi, Radresults In (09/21 2204)   No active infiltrate   Left upper lung granuloma versus other nodule. Nonemergent chest CT follow-up could BE considered         Radiologist location ID: [id number]             Medical Decision Making          MDM        Studies Assessed:  Lab, EKG, radiology    EKG:   This EKG interpreted by me shows:    Rate:  79 beats per minute    Interpretation:  PR 140, No consistent ST Elevation, No Acute STEMI Identified.      MDM Narrative:  This patient is a 57 year old female who presents with general malaise.  She also complains of weakness.  She has history of diabetes, for which screening blood work did note hyperglycemia.  Patient was given IV fluids, in addition to insulin.  On follow-up evaluation she did feel much improved.  The patient preferred to be discharged home prior to the follow up lactate to be obtained.  She has full capacity to make this decision.  Return precautions given.  Notably, did discuss the abnormality on her chest x-ray, and need to follow up with the primary care provider.  Patient understood these instructions.        Differential includes but not limited to:  General malaise:  Patient complains of malaise and could be consistent with dehydration in the setting of the hyperglycemia  Hyperglycemia: The patient did have an elevated blood glucose of 435 on blood labs.  IV fluids, and insulin was administered.  Infectious etiology: The patient did not have any red flag infectious findings,      Follow-Up Discussion:  Patient preferred to be  discharged home    Please see documentation above for specific labs and radiology.  Medications Administered in the ED   insulin R human 100 units/mL injection (5 Units Subcutaneous Given 07/05/23 2121)   NS bolus infusion 2,000 mL (0 mL Intravenous Stopped 07/05/23 2239)       Following the history, physical exam, and ED workup, the patient was deemed stable and suitable for discharge. The patient/caregiver was advised to return to the ED for any new or worsening symptoms. Discharge medications, and follow-up instructions were discussed with the patient/caregiver in detail, who verbalizes understanding. The patient/caregiver is in agreement and is comfortable with the plan of care.    Disposition: Discharged         Current Discharge Medication List        CONTINUE these medications - NO CHANGES were made during your visit.        Details   aspirin 81 mg Tablet, Chewable   81 mg, Oral, DAILY  Refills: 0     benzonatate 100 mg Capsule  Commonly known as: TESSALON   100 mg, Oral, EVERY 8 HOURS PRN  Qty: 12 Capsule  Refills: 0     empagliflozin 25 mg Tablet  Commonly known as: JARDIANCE   25 mg, Oral, DAILY  Refills: 0     * gabapentin 800 mg Tablet  Commonly known as: NEURONTIN   800 mg, Oral, 3 TIMES DAILY  Refills: 0     * gabapentin 100 mg Capsule  Commonly known as: NEURONTIN   100 mg, 3 TIMES DAILY  Refills: 0     guaiFENesin 600 mg Tablet Extended Release 12hr  Commonly known as: MUCINEX   600 mg, Oral, EVERY 12 HOURS  Refills: 0     insulin glargine 100 unit/mL injection (vial)   40 Units, Subcutaneous, NIGHTLY  Refills: 0     Miconazole Nitrate 1,200-2 mg-% Kit   1,200 mg, Vaginal, DAILY  Qty: 1 Each  Refills: 0     naproxen 500 mg Tablet  Commonly known as: NAPROSYN   500 mg, Oral, EVERY 12 HOURS PRN  Qty: 20 Tablet  Refills: 0     ramipriL 10 mg Capsule  Commonly known as: ALTACE   10 mg, Oral, DAILY  Refills: 0           * This list has 2 medication(s) that are the same as other  medications prescribed for you. Read the directions carefully, and ask your doctor or other care provider to review them with you.                Follow up:   No follow-up provider specified.             Clinical Impression   Hyperglycemia (Primary)   Malaise         Discharge Medication List as of 07/05/2023 11:10 PM            /R. Tobey Bride, MD, Lacie Scotts  Department of Emergency Medicine  [facility]

## 2023-07-05 NOTE — ED Triage Notes (Signed)
States since Tuesday hasn't had any energy, feels weak, no appetite.

## 2023-07-06 ENCOUNTER — Telehealth (HOSPITAL_COMMUNITY): Payer: Self-pay

## 2023-07-06 NOTE — ED Notes (Signed)
First attempt to conduct initial BI. No answer, no voicemail.

## 2023-07-07 DIAGNOSIS — R531 Weakness: Secondary | ICD-10-CM

## 2023-07-07 LAB — ECG 12 LEAD
Atrial Rate: 79 {beats}/min
Calculated P Axis: 72 degrees
Calculated R Axis: 53 degrees
Calculated T Axis: 68 degrees
PR Interval: 140 ms
QRS Duration: 86 ms
QT Interval: 390 ms
QTC Calculation: 447 ms
Ventricular rate: 79 {beats}/min

## 2023-08-09 ENCOUNTER — Emergency Department
Admission: EM | Admit: 2023-08-09 | Discharge: 2023-08-09 | Disposition: A | Discharge status: Home / Self Care | Payer: MEDICAID | Source: Home / Self Care | Attending: Physician Assistant | Admitting: Physician Assistant

## 2023-08-09 ENCOUNTER — Emergency Department (HOSPITAL_COMMUNITY): Payer: MEDICAID

## 2023-08-09 ENCOUNTER — Other Ambulatory Visit: Payer: Self-pay

## 2023-08-09 ENCOUNTER — Encounter (HOSPITAL_COMMUNITY): Payer: Self-pay

## 2023-08-09 DIAGNOSIS — Z87891 Personal history of nicotine dependence: Secondary | ICD-10-CM | POA: Insufficient documentation

## 2023-08-09 DIAGNOSIS — E119 Type 2 diabetes mellitus without complications: Secondary | ICD-10-CM | POA: Insufficient documentation

## 2023-08-09 DIAGNOSIS — J069 Acute upper respiratory infection, unspecified: Secondary | ICD-10-CM | POA: Insufficient documentation

## 2023-08-09 DIAGNOSIS — Z1152 Encounter for screening for COVID-19: Secondary | ICD-10-CM | POA: Insufficient documentation

## 2023-08-09 LAB — COVID-19, FLU A/B, RSV RAPID BY PCR
INFLUENZA VIRUS TYPE A: NOT DETECTED
INFLUENZA VIRUS TYPE B: NOT DETECTED
RESPIRATORY SYNCTIAL VIRUS (RSV): NOT DETECTED
SARS-CoV-2: NOT DETECTED

## 2023-08-09 MED ORDER — METHYLPREDNISOLONE 4 MG TABLETS IN A DOSE PACK
ORAL_TABLET | ORAL | 0 refills | Status: DC
Start: 2023-08-09 — End: 2023-12-09

## 2023-08-09 NOTE — ED Triage Notes (Signed)
Cough, congestion x five days with body aches

## 2023-08-09 NOTE — ED Nurses Note (Signed)
Patient discharged home with family. AVS reviewed with patient. A written copy of the AVS and discharge instructions were given to the patient. Questions sufficiently answered as needed. Patient encouraged to follow up with PCP as indicated. In the event of an emergency, patient instructed to call 911 or go to the nearest emergency room. Patient left department via ambulation.

## 2023-08-09 NOTE — ED Provider Notes (Signed)
[facility] - Emergency Department  ED Primary Provider Note  History of Present Illness   Chief Complaint   Patient presents with    Flu Like Symptoms     DONNALYN TIESZEN is a 57 y.o. female who had concerns including Flu Like Symptoms.  Arrival: The patient arrived by Car    Patient 57 year old female past will history of tobacco use presents emergency department with complaints of cough and congestion and fever for the past 4-5 days.  She reports some shortness of breath.  She has a nebulizer at home she took a breathing treatment before arrival.  States her sister has similar symptoms it was also been ill denies any cardiac history denies chest pain      History Reviewed This Encounter: Medical History  Surgical History  Family History  Social History    Physical Exam   ED Triage Vitals [08/09/23 0203]   BP (Non-Invasive) 117/70   Heart Rate (!) 110   Respiratory Rate 20   Temperature 36.2 C (97.1 F)   SpO2 100 %   Weight 49.9 kg (110 lb)   Height 1.575 m (5\' 2" )     Physical Exam  HENT:      Head: Normocephalic.      Nose: Nose normal.      Mouth/Throat:      Mouth: Mucous membranes are moist.   Eyes:      Extraocular Movements: Extraocular movements intact.      Pupils: Pupils are equal, round, and reactive to light.   Cardiovascular:      Rate and Rhythm: Normal rate and regular rhythm.      Pulses: Normal pulses.      Heart sounds: Normal heart sounds.   Pulmonary:      Effort: Pulmonary effort is normal.      Comments: Decreased breath sounds throughout  Abdominal:      General: Abdomen is flat.      Palpations: Abdomen is soft.   Musculoskeletal:      Cervical back: Normal range of motion.   Neurological:      Mental Status: She is alert.       Patient Data     Labs Ordered/Reviewed   COVID-19, FLU A/B, RSV RAPID BY PCR - Normal    Narrative:     Results are for the simultaneous qualitative identification of SARS-CoV-2 (formerly 2019-nCoV), Influenza A, Influenza B, and RSV RNA.  These etiologic agents are generally detectable in nasopharyngeal and nasal swabs during the ACUTE PHASE of infection. Hence, this test is intended to be performed on respiratory specimens collected from individuals with signs and symptoms of upper respiratory tract infection who meet Centers for Disease Control and Prevention (CDC) clinical and/or epidemiological criteria for Coronavirus Disease 2019 (COVID-19) testing. CDC COVID-19 criteria for testing on human specimens is available at [facility] webpage information for Healthcare Professionals: Coronavirus Disease 2019 (COVID-19) ([email or web address]).     False-negative results may occur if the virus has genomic mutations, insertions, deletions, or rearrangements or if performed very early in the course of illness. Otherwise, negative results indicate virus specific RNA targets are not detected, however negative results do not preclude SARS-CoV-2 infection/COVID-19, Influenza, or Respiratory syncytial virus infection. Results should not be used as the sole basis for patient management decisions. Negative results must be combined with clinical observations, patient history, and epidemiological information. If upper respiratory tract infection is still suspected based on exposure history together with other clinical findings, re-testing should  be considered.    Test methodology:   Cepheid Xpert Xpress SARS-CoV-2/Flu/RSV Assay real-time polymerase chain reaction (RT-PCR) test on the GeneXpert Dx and Xpert Xpress systems.     No orders to display     Medical Decision Making        Medical Decision Making  Patient's chest x-ray clear flu COVID RSV negative.  Likely URI.  We will put patient on Medrol Dosepak given her smoking history she was advised on paying attention to her blood sugar given her history of diabetes.  Recommended follow up with primary care if new or worsening symptoms return to the emergency department    Amount  and/or Complexity of Data Reviewed  Radiology: ordered.                   Clinical Impression   Upper respiratory tract infection, unspecified type (Primary)       Disposition: Discharged

## 2023-08-09 NOTE — Discharge Instructions (Signed)
Flu COVID RSV negative chest x-ray looks okay.  A prescription for a steroid Dosepak was written take this as prescribed he to watch your blood sugar very carefully because this can cause it to be elevated.  Drink plenty of fluids.  Use your breathing treatments 2-3 times per day Follow up with the primary care to make sure getting better.  If new or worsening symptoms return to the emergency department.

## 2023-10-15 ENCOUNTER — Emergency Department
Admission: EM | Admit: 2023-10-15 | Discharge: 2023-10-16 | Disposition: A | Discharge status: Home / Self Care | Payer: MEDICAID | Attending: Emergency Medicine | Admitting: Emergency Medicine

## 2023-10-15 ENCOUNTER — Other Ambulatory Visit: Payer: Self-pay

## 2023-10-15 ENCOUNTER — Emergency Department (HOSPITAL_COMMUNITY): Payer: MEDICAID

## 2023-10-15 ENCOUNTER — Encounter (HOSPITAL_COMMUNITY): Payer: Self-pay

## 2023-10-15 DIAGNOSIS — M940 Chondrocostal junction syndrome [Tietze]: Secondary | ICD-10-CM | POA: Insufficient documentation

## 2023-10-15 DIAGNOSIS — R079 Chest pain, unspecified: Secondary | ICD-10-CM

## 2023-10-15 DIAGNOSIS — R000 Tachycardia, unspecified: Secondary | ICD-10-CM | POA: Insufficient documentation

## 2023-10-15 DIAGNOSIS — S46911A Strain of unspecified muscle, fascia and tendon at shoulder and upper arm level, right arm, initial encounter: Secondary | ICD-10-CM | POA: Insufficient documentation

## 2023-10-15 DIAGNOSIS — F17210 Nicotine dependence, cigarettes, uncomplicated: Secondary | ICD-10-CM | POA: Insufficient documentation

## 2023-10-15 DIAGNOSIS — Z8249 Family history of ischemic heart disease and other diseases of the circulatory system: Secondary | ICD-10-CM

## 2023-10-15 DIAGNOSIS — F17200 Nicotine dependence, unspecified, uncomplicated: Secondary | ICD-10-CM

## 2023-10-15 DIAGNOSIS — Z853 Personal history of malignant neoplasm of breast: Secondary | ICD-10-CM | POA: Insufficient documentation

## 2023-10-15 DIAGNOSIS — Y999 Unspecified external cause status: Secondary | ICD-10-CM

## 2023-10-15 LAB — MANUAL DIFFERENTIAL
EOSINOPHIL %: 1 % (ref 0–7)
EOSINOPHIL ABSOLUTE: 0.12 10*3/uL (ref 0.00–0.80)
EOSINOPHILS MANUAL: 1
LYMPHOCYTE %: 31 % (ref 25–45)
LYMPHOCYTE ABSOLUTE: 3.63 10*3/uL (ref 1.10–5.00)
LYMPHOCYTES MANUAL: 31
MONOCYTE %: 9 % (ref 0–12)
MONOCYTE ABSOLUTE: 1.05 10*3/uL (ref 0.00–1.30)
MONOCYTES MANUAL: 9
MYELOCYTE %: 2 %
MYELOCYTE ABSOLUTE: 0.23 10*3/uL
MYELOCYTES MANUAL: 2
NEUTROPHIL %: 57 % (ref 40–76)
NEUTROPHIL ABSOLUTE: 6.67 10*3/uL (ref 1.80–8.40)
NEUTROPHILS MANUAL: 57
PLATELET MORPHOLOGY COMMENT: NORMAL
RBC MORPHOLOGY COMMENT: NORMAL
TOTAL CELLS COUNTED [#] IN BLOOD: 100
WBC: 11.7 10*3/uL

## 2023-10-15 LAB — PT/INR
INR: 1.02 (ref 0.84–1.10)
PROTHROMBIN TIME: 11.9 s (ref 9.8–12.7)

## 2023-10-15 LAB — CBC WITH DIFF
HCT: 46 % — ABNORMAL HIGH (ref 31.2–41.9)
HGB: 15.5 g/dL — ABNORMAL HIGH (ref 10.9–14.3)
MCH: 31 pg (ref 24.7–32.8)
MCHC: 33.7 g/dL (ref 32.3–35.6)
MCV: 91.9 fL (ref 75.5–95.3)
MPV: 8.3 fL (ref 7.9–10.8)
PLATELETS: 269 10*3/uL (ref 140–440)
RBC: 5 10*6/uL — ABNORMAL HIGH (ref 3.63–4.92)
RDW: 13.8 % (ref 12.3–17.7)
WBC: 11.7 10*3/uL (ref 3.8–11.8)

## 2023-10-15 LAB — COMPREHENSIVE METABOLIC PANEL, NON-FASTING
ALBUMIN/GLOBULIN RATIO: 1.2 (ref 0.8–1.4)
ALBUMIN: 4.6 g/dL (ref 3.5–5.7)
ALKALINE PHOSPHATASE: 123 U/L — ABNORMAL HIGH (ref 34–104)
ALT (SGPT): 13 U/L (ref 7–52)
ANION GAP: 17 mmol/L — ABNORMAL HIGH (ref 4–13)
AST (SGOT): 19 U/L (ref 13–39)
BILIRUBIN TOTAL: 0.7 mg/dL (ref 0.3–1.0)
BUN/CREA RATIO: 28 — ABNORMAL HIGH (ref 6–22)
BUN: 15 mg/dL (ref 7–25)
CALCIUM, CORRECTED: 9.6 mg/dL (ref 8.9–10.8)
CALCIUM: 10.1 mg/dL (ref 8.6–10.3)
CHLORIDE: 102 mmol/L (ref 98–107)
CO2 TOTAL: 18 mmol/L — ABNORMAL LOW (ref 21–31)
CREATININE: 0.53 mg/dL — ABNORMAL LOW (ref 0.60–1.30)
ESTIMATED GFR: 109 mL/min/{1.73_m2} (ref >59)
GLOBULIN: 3.9 — ABNORMAL HIGH (ref 2.0–3.5)
GLUCOSE: 260 mg/dL — ABNORMAL HIGH (ref 74–109)
OSMOLALITY, CALCULATED: 284 mosm/kg (ref 270–290)
POTASSIUM: 3.6 mmol/L (ref 3.5–5.1)
PROTEIN TOTAL: 8.5 g/dL (ref 6.4–8.9)
SODIUM: 137 mmol/L (ref 136–145)

## 2023-10-15 LAB — TROPONIN-I
TROPONIN I: 5 ng/L (ref <15)
TROPONIN I: 6 ng/L (ref <15)

## 2023-10-15 LAB — PTT (PARTIAL THROMBOPLASTIN TIME): APTT: 30.5 s (ref 25.0–38.0)

## 2023-10-15 MED ORDER — IOHEXOL 350 MG IODINE/ML INTRAVENOUS SOLUTION
50.0000 mL | INTRAVENOUS | Status: AC
Start: 2023-10-15 — End: 2023-10-15
  Administered 2023-10-15: 75 mL via INTRAVENOUS

## 2023-10-15 MED ORDER — ASPIRIN 81 MG CHEWABLE TABLET
324.0000 mg | CHEWABLE_TABLET | ORAL | Status: AC
Start: 2023-10-15 — End: 2023-10-15
  Administered 2023-10-15: 324 mg via ORAL

## 2023-10-15 MED ORDER — ASPIRIN 81 MG CHEWABLE TABLET
CHEWABLE_TABLET | ORAL | Status: AC
Start: 2023-10-15 — End: 2023-10-15
  Filled 2023-10-15: qty 4

## 2023-10-15 NOTE — ED Triage Notes (Addendum)
Right shoulder hurting x 2 months. Stated she was driving home from church this evening and started having chest pressure and tightness in center of chest. States she is now having back pain.

## 2023-10-15 NOTE — ED Provider Notes (Signed)
[facility]  ED Primary Provider Note  Patient Name: Gail Morris  Patient Age: 58 y.o.  Date of Birth: 10-01-1966    Chief Complaint: Chest Pain         History of Present Illness       Gail Morris is a 58 y.o. female who had concerns including Chest Pain .     HPI     *** chief complaint chest pain.      HPI   Location midsternum   Radiation: The pain actually started a couple of weeks in her right shoulder but then moved to the midsternum.  Other than this there was no significant radiation   Onset gradual with acute worsening  Timing immediately prior to arrival  Activity at onset: Patient was driving home from church when the midsternal chest pain started.  Severity moderate to severe   Quality sharp stabbing pain with a baseline pressure pain  Duration constant.  Prior to this it was intermittent shoulder pain   Modifying factors:  Patient states that nothing helps.  Nothing makes it worse.  Shoulder pain: Patient states that 2 weeks ago she started having gradual shoulder pain.  She states it progressively got worse until the patient states she can not longer use her right arm.  She states she can no longer fix her hair or do dishes with the right arm due to the pain.  Additional symptoms: Patient denies any fever or chills.  She does have shortness of breath that is worse than her baseline.  Patient has no cough.  She initially denied any palpitations but she significantly tachycardic today.  She also has significant hypertension today despite medications.  She has no syncope but she feels like she could pass out.  She does have chest pain in the midsternum.  She has no abdominal pain.  There was no nausea vomiting or diarrhea.  PMH: reviewed and listed on the chart below.   SH: reviewed and listed on the chart below.   Social History: reviewed and listed on the chart below.   Family History: reviewed and listed on the chart below.   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  Patient continues to smoke.  She has a history of breast cancer past.  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    Anxiety     Breast cancer (CMS HCC)     Cancer (CMS HCC)     Depression     Diabetes mellitus, type 2 (CMS HCC)     Esophageal reflux     H/O blood clots     H/O foot surgery     Hiccups     HTN (hypertension)     Migraine     Neuropathy (CMS HCC)     Primary generalized (osteo)arthritis     PSHx:   Past Surgical History:   Procedure Laterality Date    HAND SURGERY      HX BACK SURGERY      HX BREAST RECONSTRUCTION      HX CESAREAN SECTION      HX CHOLECYSTECTOMY  SHOULDER ADHESION RELEASE Left     SIMPLE MASTECTOMY Bilateral        Allergies:    Allergies   Allergen Reactions    Metformin  Other Adverse Reaction (Add comment)     "Dehydrates me"    Tramadol  Other Adverse Reaction (Add comment)     Tachycardia      Trulicity [Dulaglutide]  Other Adverse Reaction (Add comment)     Loss of appetite    Social History  Social History     Tobacco Use    Smoking status: Every Day     Current packs/day: 1.00     Types: Cigarettes    Smokeless tobacco: Never   Vaping Use    Vaping status: Never Used   Substance Use Topics    Alcohol use: Never    Drug use: Yes     Types: Marijuana     Comment: USED AROUND 3 DAYS AGO       Family History  Family Medical History:    None            Home Meds:      Prior to Admission medications    Medication Sig Start Date End Date Taking? Authorizing Provider   aspirin 81 mg Oral Tablet, Chewable Chew 1 Tablet (81 mg total) Once a day    Provider, Historical   benzonatate (TESSALON) 100 mg Oral Capsule Take 1 Capsule (100 mg total) by mouth Every 8 hours as needed for  Cough 08/31/22   Quick, Cydney, FNP-BC   empagliflozin (JARDIANCE) 25 mg Oral Tablet Take 1 Tablet (25 mg total) by mouth Once a day    Provider, Historical   gabapentin (NEURONTIN) 100 mg Oral Capsule 1 Capsule (100 mg total) Three times a day 03/18/22   Provider, Historical   gabapentin (NEURONTIN) 800 mg Oral Tablet Take 1 Tablet (800 mg total) by mouth Three times a day    Provider, Historical   guaiFENesin (MUCINEX) 600 mg Oral Tablet Extended Release 12hr Take 1 Tablet (600 mg total) by mouth Every 12 hours 08/31/22   Quick, Cydney, FNP-BC   insulin glargine 100 unit/mL Subcutaneous injection (vial) Inject 40 Units under the skin Every night    Provider, Historical   Methylprednisolone (MEDROL DOSEPACK) 4 mg Oral Tablets, Dose Pack Take as instructed. 08/09/23   McDuffee, Elmyra Ricks, PA-C   Miconazole Nitrate 1,200-2 mg-% Vaginal Kit Insert 1,200 mg into the vagina Once a day 04/22/23   Feliberto Gottron, MD   naproxen (NAPROSYN) 500 mg Oral Tablet Take 1 Tablet (500 mg total) by mouth Every 12 hours as needed for Pain 05/12/23   Madelaine Bhat, DO   ramipriL (ALTACE) 10 mg Oral Capsule Take 1 Capsule (10 mg total) by mouth Once a day    Provider, Historical          ROS: A total of 10 systems were reviewed by me at the time of the visit. All are negative except the HPI and the following.           Physical Exam   ED Triage Vitals   BP (Non-Invasive) 10/15/23 2137 (!) 224/120   Heart Rate 10/15/23 2137 (!) 115   Respiratory Rate 10/15/23 2137 20   Temperature 10/15/23 2137 36.4 C (97.5 F)   SpO2 10/15/23 2137 99 %   Weight 10/15/23 2209 49.9 kg (110 lb)   Height 10/15/23 2209 1.575 m (5\' 2" )         Physical exam  Constitutional/general: The nursing notes were reviewed and agreed upon.  The vital signs were reviewed and are listed on the chart.   58 year old female who appears to be much older than her stated age.  She has physiologic age of a 58 year old female.  That said, she is  in no acute distress.  Patient  appears to be in moderate acute pain.  She is complaining of both right shoulder pain in midsternal pain.  HEENT: Eyes show normal extraocular movements.  Well-hydrated oral mucosa is noted.  There is no facial trauma or abnormalities.  Neck: No midline tenderness, no meningeal signs.  Full range of motion.  Bilateral JVD.    Cardiovascular:  Heart is regular rhythm but accelerated rate at 115 beats per minute.  The patient has no murmur, pericardial rub click.  She does have mild JVD by feel that this is mainly hypertension.      Respiratory: Lungs are clear to auscultation in all fields without wheeze rale or rhonchi.  GI: Abdomen is soft non tender normal bowel sounds are auscultated.  There is no rebound tenderness or guarding  Neuro cranial nerves II through XII are intact and normal.  Patient has normal speech and normal gait.  There is no muscle weakness in any extremities.  Sensation is intact throughout.   Psych: Patient is alert and oriented person place and time.  Patient is very pleasant converse with has a euthymic affect.  Patient appears to be moderately anxious.    Skin: No rash.  No petechiae or purpura.  The skin is warm and dry without diaphoresis.  There is no pallor.  Musculoskeletal:  Patient has moderate to severe tenderness to palpation to the right anterior and lateral humeral head.  Patient does have some tenderness palpation to the right teres minor.  She has exquisite tenderness palpation along the ribcage of the right upper lateral chest.  She does have some tenderness palpation in the midsternum but it is not the same.  There is no tenderness to palpation in any other body region.  There is no lower extremity edema and no calf tenderness.  Negative Homan's sign bilaterally.  GU: Deferred        Procedures      Patient Data   {Click here to open the ED Workup Activity for clinical data review *This link will automatically disappear upon signing your note*:123}  Labs Ordered/Reviewed    COMPREHENSIVE METABOLIC PANEL, NON-FASTING - Abnormal; Notable for the following components:       Result Value    CO2 TOTAL 18 (*)     ANION GAP 17 (*)     CREATININE 0.53 (*)     BUN/CREA RATIO 28 (*)     GLUCOSE 260 (*)     ALKALINE PHOSPHATASE 123 (*)     GLOBULIN 3.9 (*)     All other components within normal limits    Narrative:     Estimated Glomerular Filtration Rate (eGFR) is calculated using the CKD-EPI (2021) equation, intended for patients 42 years of age and older. If gender is not documented or "unknown", there will be no eGFR calculation.     CBC WITH DIFF - Abnormal; Notable for the following components:    RBC 5.00 (*)     HGB 15.5 (*)     HCT 46.0 (*)     All other components within normal limits   PT/INR - Normal    Narrative:     In the setting of  warfarin therapy, a moderate-intensity INR goal range is 2.0 to 3.0 and a high-intensity INR goal range is 2.5 to 3.5.    INR is ONLY validated to determine the level of anticoagulation with vitamin K antagonists (warfarin). Other factors may elevate the INR including but not limited to direct oral anticoagulants (DOACs), liver dysfunction, vitamin K deficiency, DIC, factor deficiencies, and factor inhibitors.   PTT (PARTIAL THROMBOPLASTIN TIME) - Normal   TROPONIN-I - Normal   CBC/DIFF    Narrative:     The following orders were created for panel order CBC/DIFF.  Procedure                               Abnormality         Status                     ---------                               -----------         ------                     CBC WITH XLKG[[id number]]                Abnormal            Final result               MANUAL DIFFERENTIAL[[id number]]                              Final result                 Please view results for these tests on the individual orders.   MANUAL DIFFERENTIAL   TROPONIN-I   TROPONIN-I       BMP:   137 (01/01 2205) 102 (01/01 2205) 15 (01/01 2205)    /     260* (01/01 2205)   3.6 (01/01 2205) 18* (01/01 2205) 0.53* (01/01  2205) \              CBC:     11.7, 11.7 (01/01 2205) \   15.5* (01/01 2205) /   269 (01/01 2205)      / 46.0* (01/01 2205) \             CT ANGIO CHEST FOR PULMONARY EMBOLUS W IV CONTRAST   Final Result by Edi, Radresults In (01/01 2317)   No CTA evidence of pulmonary embolus.   Mild bronchial wall thickening involving both lower lobes which can be seen with bronchial wall infection or inflammation.   Small amount of secretion or mucous plug within a left lower lobe medial basal segmental bronchus.   Mildly prominent right hilar nodes which are nonspecific in etiology.   Other nonacute findings as detailed above.            One or more dose reduction techniques were used (e.g., Automated exposure control, adjustment of the mA and/or kV according to patient size, use of iterative reconstruction technique).         Radiologist location ID: [id number]         XR AP MOBILE CHEST   Final Result by Edi, Radresults In (01/01 2217)   NO ACUTE FINDINGS.  Radiologist location ID: [id number]                 ED clinical impression missing, please click on the following link to add Clinical Impressions ***  then refresh the note prior to signing.    Medical Decision Making  Feel the patient is actually chest pain is musculoskeletal related.  My biggest concern was pulmonary embolism.  However the CT scan does not reveal pulmonary emboli.  I feel safe in letting her go home as long as her 2nd troponin is okay.    Amount and/or Complexity of Data Reviewed  Labs: ordered. Decision-making details documented in ED Course.  Radiology: ordered and independent interpretation performed. Decision-making details documented in ED Course.     Details: I personally viewed all radiologic studies at the time of the visit. I agree with the radiologist.    ECG/medicine tests: ordered and independent interpretation performed. Decision-making details documented in ED Course.     Details: EKG is interpreted at.  This will be  cardiologist across QT/.  No ST Q-waves to, 3 sinus tachycardic at 115 per min            ***      ED Course as of 10/15/23 2350   Wed Oct 15, 2023   2153 BP (Non-Invasive)(!): 224/120   2153 Heart Rate(!): 115   2153 Respiratory Rate: 20   2154 SpO2: 99 %   2154 Temperature: 36.4 C (97.5 F)   2345 ANION GAP(!): 17   2346 CARBON DIOXIDE(!): 18   2346 WBC: 11.7   2346 HGB(!): 15.5   2346 HCT(!): 46.0   2346 PLATELET COUNT: 269   2346 CBC/DIFF(!)   2346 COMPREHENSIVE METABOLIC PANEL, NON-FASTING(!)   2346 BUN: 15   2346 CREATININE(!): 0.53   2346 GLUCOSE(!): 260   2346 PT/INR   2346 PTT (PARTIAL THROMBOPLASTIN TIME)   2346 TROPONIN-I: 5   2346 XR AP MOBILE CHEST  I personally viewed all radiologic studies at the time of the visit. I agree with the radiologist.     2347 CT ANGIO CHEST FOR PULMONARY EMBOLUS W IV CONTRAST  I personally viewed all radiologic studies at the time of the visit. I agree with the radiologist.     [phone] ECG 12 LEAD  EKG is interpreted at.  This will be cardiologist across QT/.  No ST Q-waves to, 3 sinus tachycardic at 115 per min           Medications Administered in the ED   aspirin chewable tablet 324 mg (324 mg Oral Given 10/15/23 2200)   iohexol (OMNIPAQUE 350) infusion (75 mL Intravenous Given 10/15/23 2254)                 ED clinical impression missing, please click on the following link to add Clinical Impressions ***  then refresh the note prior to signing.        Data Unavailable      Current Discharge Medication List            ***      _______________________________  Alphonzo Cruise, D.O.  Emergency Medicine  [place] [facility]          This note may have been partially generated using MModal Fluency Direct system, and there may be some incorrect words, spellings, and punctuation that were not noted in checking the note before saving, though effort was made to avoid such errors.    {Remember to refresh your note  prior to signing. Use Control + F11 or click the refresh button at  the bottom of the note. This reminder text will automatically disappear when you sign your note.:123}

## 2023-10-16 DIAGNOSIS — R000 Tachycardia, unspecified: Secondary | ICD-10-CM

## 2023-10-16 DIAGNOSIS — R9431 Abnormal electrocardiogram [ECG] [EKG]: Secondary | ICD-10-CM

## 2023-10-16 LAB — ECG 12 LEAD
Atrial Rate: 116 {beats}/min
Calculated P Axis: 76 degrees
Calculated R Axis: 57 degrees
Calculated T Axis: 67 degrees
PR Interval: 140 ms
QRS Duration: 78 ms
QT Interval: 346 ms
QTC Calculation: 480 ms
Ventricular rate: 116 {beats}/min

## 2023-10-16 MED ORDER — HYDROCODONE 7.5 MG-ACETAMINOPHEN 325 MG TABLET
1.0000 | ORAL_TABLET | ORAL | 0 refills | Status: DC | PRN
Start: 2023-10-16 — End: 2023-12-09

## 2023-10-16 MED ORDER — HYDROCODONE 10 MG-ACETAMINOPHEN 325 MG TABLET
ORAL_TABLET | ORAL | Status: AC
Start: 2023-10-16 — End: 2023-10-16
  Filled 2023-10-16: qty 1

## 2023-10-16 MED ORDER — HYDROCODONE 10 MG-ACETAMINOPHEN 325 MG TABLET
1.0000 | ORAL_TABLET | ORAL | Status: AC
Start: 2023-10-16 — End: 2023-10-16
  Administered 2023-10-16: 1 via ORAL

## 2023-10-16 NOTE — ED Nurses Note (Signed)

## 2023-10-16 NOTE — Discharge Instructions (Addendum)
Norco every 6 hours needed for severe pain. Be careful with this medication since you take Gabapentin. The combination can make you excessively drowsy.   See your primary care provider within 1 week for additional testing on your shoulder   Return to the ER if there are any emergencies   Physical therapy to the right shoulder.  I have written an order for this.    Continue to decrease smoking into can finally quit   Continue all your normal medications at home as prescribed by your doctor

## 2023-12-08 ENCOUNTER — Other Ambulatory Visit: Payer: Self-pay

## 2023-12-08 ENCOUNTER — Emergency Department
Admission: EM | Admit: 2023-12-08 | Discharge: 2023-12-09 | Disposition: A | Discharge status: Home / Self Care | Payer: MEDICAID | Attending: Emergency Medicine | Admitting: Emergency Medicine

## 2023-12-08 ENCOUNTER — Ambulatory Visit (HOSPITAL_COMMUNITY): Payer: MEDICAID

## 2023-12-08 DIAGNOSIS — I951 Orthostatic hypotension: Secondary | ICD-10-CM | POA: Insufficient documentation

## 2023-12-08 DIAGNOSIS — Z720 Tobacco use: Secondary | ICD-10-CM | POA: Diagnosis present

## 2023-12-08 DIAGNOSIS — M25511 Pain in right shoulder: Secondary | ICD-10-CM | POA: Insufficient documentation

## 2023-12-08 DIAGNOSIS — R079 Chest pain, unspecified: Principal | ICD-10-CM | POA: Insufficient documentation

## 2023-12-08 DIAGNOSIS — F17210 Nicotine dependence, cigarettes, uncomplicated: Secondary | ICD-10-CM | POA: Insufficient documentation

## 2023-12-08 DIAGNOSIS — M545 Low back pain, unspecified: Secondary | ICD-10-CM | POA: Insufficient documentation

## 2023-12-08 DIAGNOSIS — Z794 Long term (current) use of insulin: Secondary | ICD-10-CM | POA: Insufficient documentation

## 2023-12-08 DIAGNOSIS — E119 Type 2 diabetes mellitus without complications: Secondary | ICD-10-CM | POA: Insufficient documentation

## 2023-12-08 DIAGNOSIS — Z7984 Long term (current) use of oral hypoglycemic drugs: Secondary | ICD-10-CM | POA: Insufficient documentation

## 2023-12-08 DIAGNOSIS — I959 Hypotension, unspecified: Secondary | ICD-10-CM | POA: Diagnosis present

## 2023-12-08 DIAGNOSIS — M549 Dorsalgia, unspecified: Secondary | ICD-10-CM

## 2023-12-08 MED ORDER — NITROGLYCERIN 2 % TRANSDERMAL OINTMENT - PACKET
TOPICAL_OINTMENT | TRANSDERMAL | Status: AC
Start: 2023-12-08 — End: 2023-12-08
  Filled 2023-12-08: qty 1

## 2023-12-08 MED ORDER — ASPIRIN 81 MG CHEWABLE TABLET
324.0000 mg | CHEWABLE_TABLET | ORAL | Status: AC
Start: 2023-12-08 — End: 2023-12-08
  Administered 2023-12-08: 324 mg via ORAL

## 2023-12-08 MED ORDER — NITROGLYCERIN 2 % TRANSDERMAL OINTMENT - PACKET
1.0000 [in_us] | TOPICAL_OINTMENT | TRANSDERMAL | Status: DC
Start: 2023-12-08 — End: 2023-12-09
  Administered 2023-12-08: 0 [in_us] via TOPICAL

## 2023-12-08 MED ORDER — ASPIRIN 81 MG CHEWABLE TABLET
CHEWABLE_TABLET | ORAL | Status: AC
Start: 2023-12-08 — End: 2023-12-08
  Filled 2023-12-08: qty 4

## 2023-12-08 NOTE — ED Provider Notes (Signed)
 Emergency Medicine    Name: Gail Morris  Age and Gender: 58 y.o. female  Date of Birth: 05-Jul-1966  MRN: [id number]  PCP: Mariah Milling, DO    CC:  Chief Complaint   Patient presents with    Chest Pain     Back Pain     Lower       HPI:  Gail Morris is a 58 y.o. White female with history of chest pain for 'a couple of days,' but with no nausea, dyspnea or new sweats.  She also has had some right lower back pain. She has been cleaning her house and admits she may have pulled a muscle.     She has had months of right shoulder pain and can't do dishes due to the pain. She was expecting a referral to therapy but it was never ordered.        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    Anxiety     Breast cancer (CMS HCC)     Cancer (CMS HCC)     Depression     Diabetes mellitus, type 2 (CMS HCC)     Esophageal reflux     H/O blood clots     H/O foot surgery     Hiccups     HTN (hypertension)     Migraine     Neuropathy (CMS HCC)     Primary generalized (osteo)arthritis            Allergies   Allergen Reactions    Metformin  Other Adverse Reaction (Add comment)     "Dehydrates me"    Tramadol  Other Adverse Reaction (Add comment)     Tachycardia      Trulicity [Dulaglutide]  Other Adverse Reaction (Add comment)     Loss of appetite       Past Surgical History:   Procedure Laterality Date    HAND SURGERY      HX BACK SURGERY      HX BREAST RECONSTRUCTION      HX CESAREAN SECTION       HX CHOLECYSTECTOMY      SHOULDER ADHESION RELEASE Left     SIMPLE MASTECTOMY Bilateral            Social History        Objective:    ED Triage Vitals [12/08/23 2251]   BP (Non-Invasive) (!) 151/88   Heart Rate 83   Respiratory Rate 18   Temperature 36.5 C (97.7 F)   SpO2 98 %   Weight 49.9 kg (110 lb)   Height 1.575 m (5\' 2" )     Filed Vitals:    12/09/23 0345 12/09/23 0445  12/09/23 0457 12/09/23 0500   BP: (!) 83/58 (!) 80/64  93/74   Pulse: 73 69  79   Resp: (!) 9 12  18    Temp:   36.3 C (97.3 F)    SpO2: 97% 96%  99%       Nursing notes and vital signs reviewed.    Constitutional - No acute distress.  Alert and Active.  HEENT - Normocephalic. Atraumatic. PERRL. EOMI. Conjunctiva clear. Oropharynx with no erythema, lesions, or exudates. Moist mucous membranes.   Neck - Trachea midline. No stridor. No hoarseness.  Cardiac - Regular rate and rhythm. No murmurs, rubs, or gallops.  Respiratory - Clear to auscultation bilaterally. No rales, wheezes or rhonchi.  Abdomen - Non-tender, soft, non-distended. No rebound or guarding.   Musculoskeletal - Right shoulder mildly tender.  Right flank/LS paraspinous region tender without rash.  Skin - Warm and dry, without any rashes or other lesions.  Neuro - Cranial nerves II-XII are grossly intact.  Moving all extremities symmetrically.    Any pertinent labs and imaging obtained during this encounter reviewed below in MDM.    MDM/ED Course:    EKG shows a sinus rhythm, rate of 85. PR 146 QRS 84. No STEMI.      This came in with chest pain for two days (negative trops) and EKG. She reports some chronic right shoulder pain and recent right lower back pain she attributes to lifting. She was doing fine initially but then was hypotensive in the 80's. Takes jardiance, gabapentin, hydrocodone and Altace. Abdomen non-tender. After a NS bolus she remains hypotensive in the 80's and is orthostatic.     I have asked Dr. Melida Gimenez, hospitalist, to evaluate her as I can't discharge  her with such low blood pressures.    Medical Decision Making  Amount and/or Complexity of Data Reviewed  Labs: ordered. Decision-making details documented in ED Course.  Radiology: ordered. Decision-making details documented in ED Course.  ECG/medicine tests: ordered. Decision-making details documented in ED Course.    Risk  Prescription drug management.  Decision regarding hospitalization.             Impression:   Clinical Impression   Chest pain (Primary)   Back pain   Hypotension       Disposition: Admitted          Portions of this note may have been dictated using voice recognition software.     Cain Saupe, MD  [facility] ED    -----------------------  Results for orders placed or performed during the hospital encounter of 12/08/23 (from the past 12 hours)   COMPREHENSIVE METABOLIC PANEL, NON-FASTING   Result Value Ref Range    SODIUM 140 136 - 145 mmol/L    POTASSIUM 4.3 3.5 - 5.1 mmol/L    CHLORIDE 106 98 - 107 mmol/L    CO2 TOTAL 23 21 - 31 mmol/L    ANION GAP 11 4 - 13 mmol/L    BUN 6 (L) 7 - 25 mg/dL    CREATININE 6.04 (L) 0.60 - 1.30 mg/dL    BUN/CREA RATIO 13 6 - 22    ESTIMATED GFR 111 >59 mL/min/1.31m^2    ALBUMIN 4.3 3.5 - 5.7 g/dL    CALCIUM 9.7 8.6 - 54.0 mg/dL    GLUCOSE 981 (H) 74 - 109 mg/dL    ALKALINE PHOSPHATASE 133 (H) 34 - 104 U/L    ALT (SGPT) 17 7 - 52 U/L    AST (SGOT) 24  13 - 39 U/L    BILIRUBIN TOTAL 0.4 0.3 - 1.0 mg/dL    PROTEIN TOTAL 7.7 6.4 - 8.9 g/dL    ALBUMIN/GLOBULIN RATIO 1.3 0.8 - 1.4    OSMOLALITY, CALCULATED 283 270 - 290 mOsm/kg    CALCIUM, CORRECTED 9.5 8.9 - 10.8 mg/dL    GLOBULIN 3.4 2.0 - 3.5   TROPONIN NOW   Result Value Ref Range    TROPONIN I 7 <15 ng/L   CBC WITH DIFF   Result Value Ref Range    WBC 8.8 3.8 - 11.8 x10^3/uL    RBC 4.67 3.63 - 4.92 x10^6/uL    HGB 14.8 (H) 10.9 - 14.3 g/dL    HCT 21.3 (H) 08.6 - 41.9 %    MCV 92.5 75.5 - 95.3 fL    MCH 31.7 24.7 - 32.8 pg    MCHC 34.3 32.3 - 35.6 g/dL    RDW 57.8 46.9 - 62.9 %    PLATELETS 269 140  - 440 x10^3/uL    MPV 8.3 7.9 - 10.8 fL    NEUTROPHIL % 59 43 - 77 %    LYMPHOCYTE % 32 16 - 46 %    MONOCYTE % 7 4 - 11 %    EOSINOPHIL % 1 1 - 7 %    BASOPHIL % 1 0 - 1 %    NEUTROPHIL # 5.10 1.90 - 8.20 x10^3/uL    LYMPHOCYTE # 2.80 1.10 - 3.10 x10^3/uL    MONOCYTE # 0.60 0.20 - 0.90 x10^3/uL    EOSINOPHIL # 0.10 0.00 - 0.50 x10^3/uL    BASOPHIL # 0.10 0.00 - 0.10 x10^3/uL   BLUE TOP TUBE   Result Value Ref Range    RAINBOW/EXTRA TUBE AUTO RESULT Yes    TROPONIN IN ONE HOUR   Result Value Ref Range    TROPONIN I 7 <15 ng/L   URINALYSIS, MACROSCOPIC   Result Value Ref Range    COLOR Light Yellow Colorless, Light Yellow, Yellow    APPEARANCE Clear Clear    SPECIFIC GRAVITY 1.043 (H) 1.002 - 1.030    PH 6.0 5.0 - 9.0    LEUKOCYTES Negative Negative, 100  WBCs/uL    NITRITE Negative Negative    PROTEIN Negative Negative, 10 , 20  mg/dL    GLUCOSE >5284 (A) Negative, 30  mg/dL    KETONES Negative Negative, Trace mg/dL    BILIRUBIN Negative Negative, 0.5 mg/dL    BLOOD Negative Negative, 0.03 mg/dL    UROBILINOGEN Normal Normal mg/dL   URINALYSIS, MICROSCOPIC   Result Value Ref Range    RBCS 3 <4 /hpf    WBCS 1 <6 /hpf    SQUAMOUS EPITHELIAL 2 <28 /hpf     XR AP MOBILE CHEST   Final Result   NO ACUTE FINDINGS.            Radiologist location ID: [id number]

## 2023-12-09 ENCOUNTER — Observation Stay (HOSPITAL_COMMUNITY): Payer: MEDICAID

## 2023-12-09 ENCOUNTER — Encounter (HOSPITAL_COMMUNITY): Payer: Self-pay

## 2023-12-09 DIAGNOSIS — R079 Chest pain, unspecified: Principal | ICD-10-CM | POA: Diagnosis present

## 2023-12-09 DIAGNOSIS — Z720 Tobacco use: Secondary | ICD-10-CM | POA: Diagnosis present

## 2023-12-09 DIAGNOSIS — M25511 Pain in right shoulder: Secondary | ICD-10-CM

## 2023-12-09 DIAGNOSIS — M545 Low back pain, unspecified: Secondary | ICD-10-CM

## 2023-12-09 DIAGNOSIS — I959 Hypotension, unspecified: Secondary | ICD-10-CM | POA: Diagnosis present

## 2023-12-09 DIAGNOSIS — F17210 Nicotine dependence, cigarettes, uncomplicated: Secondary | ICD-10-CM

## 2023-12-09 DIAGNOSIS — E119 Type 2 diabetes mellitus without complications: Secondary | ICD-10-CM

## 2023-12-09 DIAGNOSIS — Z794 Long term (current) use of insulin: Secondary | ICD-10-CM

## 2023-12-09 LAB — CBC WITH DIFF
BASOPHIL #: 0.1 10*3/uL (ref 0.00–0.10)
BASOPHIL %: 1 % (ref 0–1)
EOSINOPHIL #: 0.1 10*3/uL (ref 0.00–0.50)
EOSINOPHIL %: 1 % (ref 1–7)
HCT: 43.2 % — ABNORMAL HIGH (ref 31.2–41.9)
HGB: 14.8 g/dL — ABNORMAL HIGH (ref 10.9–14.3)
LYMPHOCYTE #: 2.8 10*3/uL (ref 1.10–3.10)
LYMPHOCYTE %: 32 % (ref 16–46)
MCH: 31.7 pg (ref 24.7–32.8)
MCHC: 34.3 g/dL (ref 32.3–35.6)
MCV: 92.5 fL (ref 75.5–95.3)
MONOCYTE #: 0.6 10*3/uL (ref 0.20–0.90)
MONOCYTE %: 7 % (ref 4–11)
MPV: 8.3 fL (ref 7.9–10.8)
NEUTROPHIL #: 5.1 10*3/uL (ref 1.90–8.20)
NEUTROPHIL %: 59 % (ref 43–77)
PLATELETS: 269 10*3/uL (ref 140–440)
RBC: 4.67 10*6/uL (ref 3.63–4.92)
RDW: 13.9 % (ref 12.3–17.7)
WBC: 8.8 10*3/uL (ref 3.8–11.8)

## 2023-12-09 LAB — COMPREHENSIVE METABOLIC PANEL, NON-FASTING
ALBUMIN/GLOBULIN RATIO: 1.3 (ref 0.8–1.4)
ALBUMIN: 4.3 g/dL (ref 3.5–5.7)
ALKALINE PHOSPHATASE: 133 U/L — ABNORMAL HIGH (ref 34–104)
ALT (SGPT): 17 U/L (ref 7–52)
ANION GAP: 11 mmol/L (ref 4–13)
AST (SGOT): 24 U/L (ref 13–39)
BILIRUBIN TOTAL: 0.4 mg/dL (ref 0.3–1.0)
BUN/CREA RATIO: 13 (ref 6–22)
BUN: 6 mg/dL — ABNORMAL LOW (ref 7–25)
CALCIUM, CORRECTED: 9.5 mg/dL (ref 8.9–10.8)
CALCIUM: 9.7 mg/dL (ref 8.6–10.3)
CHLORIDE: 106 mmol/L (ref 98–107)
CO2 TOTAL: 23 mmol/L (ref 21–31)
CREATININE: 0.48 mg/dL — ABNORMAL LOW (ref 0.60–1.30)
ESTIMATED GFR: 111 mL/min/{1.73_m2} (ref >59)
GLOBULIN: 3.4 (ref 2.0–3.5)
GLUCOSE: 206 mg/dL — ABNORMAL HIGH (ref 74–109)
OSMOLALITY, CALCULATED: 283 mosm/kg (ref 270–290)
POTASSIUM: 4.3 mmol/L (ref 3.5–5.1)
PROTEIN TOTAL: 7.7 g/dL (ref 6.4–8.9)
SODIUM: 140 mmol/L (ref 136–145)

## 2023-12-09 LAB — URINALYSIS, MACROSCOPIC
BILIRUBIN: NEGATIVE mg/dL
BLOOD: NEGATIVE mg/dL
GLUCOSE: >1000 mg/dL — AB
KETONES: NEGATIVE mg/dL
LEUKOCYTES: NEGATIVE WBCs/uL
NITRITE: NEGATIVE
PH: 6 (ref 5.0–9.0)
PROTEIN: NEGATIVE mg/dL
SPECIFIC GRAVITY: 1.043 — ABNORMAL HIGH (ref 1.002–1.030)
UROBILINOGEN: NORMAL mg/dL

## 2023-12-09 LAB — ECG 12 LEAD
Atrial Rate: 85 {beats}/min
Calculated P Axis: 67 degrees
Calculated R Axis: 46 degrees
Calculated T Axis: 63 degrees
PR Interval: 146 ms
QRS Duration: 84 ms
QT Interval: 386 ms
QTC Calculation: 459 ms
Ventricular rate: 85 {beats}/min

## 2023-12-09 LAB — URINALYSIS, MICROSCOPIC
RBCS: 3 /HPF (ref <4)
SQUAMOUS EPITHELIAL: 2 /HPF (ref <28)
WBCS: 1 /HPF (ref <6)

## 2023-12-09 LAB — TROPONIN-I
TROPONIN I: 7 ng/L (ref <15)
TROPONIN I: 7 ng/L (ref <15)

## 2023-12-09 LAB — BLUE TOP TUBE

## 2023-12-09 LAB — LACTIC ACID LEVEL W/ REFLEX FOR LEVEL >2.0: LACTIC ACID: 1.6 mmol/L (ref 0.5–2.2)

## 2023-12-09 LAB — POC BLOOD GLUCOSE (RESULTS)
GLUCOSE, POC: 103 mg/dL — ABNORMAL HIGH (ref 70–100)
GLUCOSE, POC: 140 mg/dL — ABNORMAL HIGH (ref 70–100)

## 2023-12-09 MED ORDER — ENOXAPARIN 40 MG/0.4 ML SUBCUTANEOUS SYRINGE
INJECTION | SUBCUTANEOUS | Status: AC
Start: 2023-12-09 — End: 2023-12-09
  Filled 2023-12-09: qty 0.4

## 2023-12-09 MED ORDER — ALUMINUM-MAG HYDROXIDE-SIMETHICONE 200 MG-200 MG-20 MG/5 ML ORAL SUSP
30.0000 mL | ORAL | Status: DC | PRN
Start: 2023-12-09 — End: 2023-12-09

## 2023-12-09 MED ORDER — ACETAMINOPHEN 325 MG TABLET
650.0000 mg | ORAL_TABLET | ORAL | Status: DC | PRN
Start: 2023-12-09 — End: 2023-12-09

## 2023-12-09 MED ORDER — SODIUM CHLORIDE 0.9 % INTRAVENOUS SOLUTION
INTRAVENOUS | Status: DC
Start: 2023-12-09 — End: 2023-12-09
  Administered 2023-12-09: 0 mL via INTRAVENOUS

## 2023-12-09 MED ORDER — INSULIN LISPRO 100 UNIT/ML SUB-Q - CHARGE BY DOSE
SUBCUTANEOUS | Status: AC
Start: 2023-12-09 — End: 2023-12-09
  Filled 2023-12-09: qty 1

## 2023-12-09 MED ORDER — ASPIRIN 81 MG CHEWABLE TABLET
CHEWABLE_TABLET | ORAL | Status: AC
Start: 2023-12-09 — End: 2023-12-09
  Filled 2023-12-09: qty 1

## 2023-12-09 MED ORDER — NICOTINE 21 MG/24 HR DAILY TRANSDERMAL PATCH
21.0000 mg | MEDICATED_PATCH | Freq: Every day | TRANSDERMAL | Status: DC
Start: 2023-12-09 — End: 2023-12-09
  Administered 2023-12-09: 21 mg via TRANSDERMAL

## 2023-12-09 MED ORDER — DAPAGLIFLOZIN PROPANEDIOL 10 MG TABLET
ORAL_TABLET | ORAL | Status: AC
Start: 2023-12-09 — End: 2023-12-09
  Filled 2023-12-09: qty 1

## 2023-12-09 MED ORDER — IPRATROPIUM 0.5 MG-ALBUTEROL 3 MG (2.5 MG BASE)/3 ML NEBULIZATION SOLN
3.0000 mL | INHALATION_SOLUTION | RESPIRATORY_TRACT | Status: DC | PRN
Start: 2023-12-09 — End: 2023-12-09

## 2023-12-09 MED ORDER — ENOXAPARIN 40 MG/0.4 ML SUBCUTANEOUS SYRINGE
40.0000 mg | INJECTION | SUBCUTANEOUS | Status: DC
Start: 2023-12-09 — End: 2023-12-09
  Administered 2023-12-09: 40 mg via SUBCUTANEOUS

## 2023-12-09 MED ORDER — SODIUM CHLORIDE 0.9 % (FLUSH) INJECTION SYRINGE
3.0000 mL | INJECTION | INTRAMUSCULAR | Status: DC | PRN
Start: 2023-12-09 — End: 2023-12-09

## 2023-12-09 MED ORDER — IOHEXOL 350 MG IODINE/ML INTRAVENOUS SOLUTION
100.0000 mL | INTRAVENOUS | Status: AC
Start: 2023-12-09 — End: 2023-12-09
  Administered 2023-12-09: 100 mL via INTRAVENOUS

## 2023-12-09 MED ORDER — ONDANSETRON HCL (PF) 4 MG/2 ML INJECTION SOLUTION
4.0000 mg | Freq: Four times a day (QID) | INTRAMUSCULAR | Status: DC | PRN
Start: 2023-12-09 — End: 2023-12-09

## 2023-12-09 MED ORDER — CYCLOBENZAPRINE 10 MG TABLET
10.0000 mg | ORAL_TABLET | ORAL | Status: AC
Start: 2023-12-09 — End: 2023-12-09
  Administered 2023-12-09: 10 mg via ORAL

## 2023-12-09 MED ORDER — DAPAGLIFLOZIN PROPANEDIOL 10 MG TABLET
10.0000 mg | ORAL_TABLET | Freq: Every day | ORAL | Status: DC
Start: 2023-12-09 — End: 2023-12-09
  Administered 2023-12-09: 10 mg via ORAL

## 2023-12-09 MED ORDER — LISINOPRIL 5 MG TABLET
10.0000 mg | ORAL_TABLET | Freq: Every day | ORAL | Status: DC
Start: 2023-12-09 — End: 2023-12-09

## 2023-12-09 MED ORDER — KETOROLAC 30 MG/ML (1 ML) INJECTION SOLUTION
15.0000 mg | Freq: Four times a day (QID) | INTRAMUSCULAR | Status: DC | PRN
Start: 2023-12-09 — End: 2023-12-09

## 2023-12-09 MED ORDER — INSULIN LISPRO 100 UNIT/ML SUB-Q SSIP VIAL
2.0000 [IU] | INJECTION | Freq: Four times a day (QID) | SUBCUTANEOUS | Status: DC
Start: 2023-12-09 — End: 2023-12-09
  Administered 2023-12-09 (×2): 0 [IU] via SUBCUTANEOUS

## 2023-12-09 MED ORDER — DEXTROSE 40 % ORAL GEL
15.0000 g | ORAL | Status: DC | PRN
Start: 2023-12-09 — End: 2023-12-09

## 2023-12-09 MED ORDER — DEXTROSE 50 % IN WATER (D50W) INTRAVENOUS SYRINGE
12.5000 g | INJECTION | INTRAVENOUS | Status: DC | PRN
Start: 2023-12-09 — End: 2023-12-09

## 2023-12-09 MED ORDER — ASPIRIN 81 MG CHEWABLE TABLET
81.0000 mg | CHEWABLE_TABLET | Freq: Every day | ORAL | Status: DC
Start: 2023-12-09 — End: 2023-12-09
  Administered 2023-12-09: 81 mg via ORAL

## 2023-12-09 MED ORDER — CYCLOBENZAPRINE 10 MG TABLET
ORAL_TABLET | ORAL | Status: AC
Start: 2023-12-09 — End: 2023-12-09
  Filled 2023-12-09: qty 1

## 2023-12-09 MED ORDER — GLUCAGON HCL 1 MG/ML SOLUTION FOR INJECTION
1.0000 mg | Freq: Once | INTRAMUSCULAR | Status: DC | PRN
Start: 2023-12-09 — End: 2023-12-09

## 2023-12-09 MED ORDER — SODIUM CHLORIDE 0.9 % IV BOLUS
1000.0000 mL | INJECTION | Status: AC
Start: 2023-12-09 — End: 2023-12-09
  Administered 2023-12-09: 1000 mL via INTRAVENOUS
  Administered 2023-12-09: 0 mL via INTRAVENOUS

## 2023-12-09 MED ORDER — SODIUM CHLORIDE 0.9 % (FLUSH) INJECTION SYRINGE
3.0000 mL | INJECTION | Freq: Three times a day (TID) | INTRAMUSCULAR | Status: DC
Start: 2023-12-09 — End: 2023-12-09
  Administered 2023-12-09: 0 mL

## 2023-12-09 MED ORDER — NICOTINE 21 MG/24 HR DAILY TRANSDERMAL PATCH
MEDICATED_PATCH | TRANSDERMAL | Status: AC
Start: 2023-12-09 — End: 2023-12-09
  Filled 2023-12-09: qty 1

## 2023-12-09 NOTE — ED Nurses Note (Signed)
 Pt back from CT, ambulate to restroom with no complaints at this time.

## 2023-12-09 NOTE — H&P (Signed)
 [place] MEDICINE [facility]    HOSPITALIST H&P    Gail Morris 58 y.o. female ED26/ED26   Date of Service: 12/09/2023    Date of Admission:  12/08/2023   PCP: Mariah Milling, DO Code Status:FULL CODE: ATTEMPT RESUSCITATION/CPR       Chief Complaint:  " Right-sided chest pain, right shoulder pain, low back pain "    HPI:   Gail Morris is a 58 year old female who presented to the emergency room on 12/08/2023 right-sided chest that radiated to right arm and shoulder.  Patient also reported having lower back pain.  The patient states that pain occurred after cleaning out her closet and possibly straining her back and shoulder.  Patient denies having any nausea, vomiting, dyspnea, diaphoresis, syncope/presyncope, dizziness.  Patient states that she has not been drinking well, mostly drinking coffee.  She states that she last took her blood pressure medicines yesterday morning.  Patient had systolic blood pressure as low as the 80s while in ER.  Patient given fluid bolus and blood pressure improved to 100 systolic.  Labs upon arrival to the ER were as follows:  Hemoglobin 14.8, hematocrit 43.2, BUN 6, creatinine 0.48, glucose 206, troponin 7/7.  Chest x-ray negative.  Urinalysis showing specific gravity of 1.043, greater than a 1000 glucose.  Patient was seen examined for hospitalist admission while in ER.        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    Anxiety     Breast cancer (CMS HCC)     Cancer (CMS  HCC)     Depression     Diabetes mellitus, type 2 (CMS HCC)     Esophageal reflux     H/O blood clots     H/O foot surgery     Hiccups     HTN (hypertension)     Migraine     Neuropathy (CMS HCC)     Primary generalized (osteo)arthritis         PSHx:   Past Surgical History:   Procedure Laterality Date    HAND SURGERY      HX BACK SURGERY      HX BREAST RECONSTRUCTION      HX CESAREAN SECTION      HX CHOLECYSTECTOMY      SHOULDER ADHESION RELEASE Left     SIMPLE MASTECTOMY Bilateral           Allergies:    Allergies   Allergen Reactions    Metformin  Other Adverse Reaction (Add comment)     "Dehydrates me"    Tramadol  Other Adverse Reaction (Add comment)     Tachycardia      Trulicity [Dulaglutide]  Other Adverse Reaction (Add comment)     Loss of appetite    Social History  Social History     Tobacco Use    Smoking status: Every Day  Current packs/day: 1.00     Types: Cigarettes    Smokeless tobacco: Never   Vaping Use    Vaping status: Never Used   Substance Use Topics    Alcohol use: Never    Drug use: Yes     Types: Marijuana     Comment: USED AROUND 3 DAYS AGO       Family History  Family Medical History:       Problem Relation (Age of Onset)    Diabetes type II Mother    No Known Problems Father               Home Meds:      Prior to Admission medications    Medication Sig Start Date End Date Taking? Authorizing Provider   aspirin 81 mg Oral Tablet, Chewable Chew 1 Tablet (81 mg total) Once a day    Provider, Historical   benzonatate (TESSALON) 100 mg Oral Capsule Take 1 Capsule (100 mg total) by mouth Every 8 hours as needed for Cough 08/31/22   Quick, Cydney, FNP-BC   empagliflozin (JARDIANCE) 25 mg Oral Tablet Take 1 Tablet (25 mg total) by mouth Once a day    Provider, Historical   gabapentin (NEURONTIN) 100 mg Oral Capsule 1 Capsule (100 mg total) Three times a day 03/18/22   Provider, Historical   gabapentin (NEURONTIN) 800 mg Oral Tablet Take 1 Tablet (800 mg total) by mouth Three times a day     Provider, Historical   guaiFENesin (MUCINEX) 600 mg Oral Tablet Extended Release 12hr Take 1 Tablet (600 mg total) by mouth Every 12 hours 08/31/22   Quick, Cydney, FNP-BC   HYDROcodone-acetaminophen (NORCO) 7.5-325 mg Oral Tablet Take 1 Tablet by mouth Every 4 hours as needed for Pain for up to 10 doses 10/16/23   Alphonzo Cruise, DO   insulin glargine 100 unit/mL Subcutaneous injection (vial) Inject 40 Units under the skin Every night    Provider, Historical   Methylprednisolone (MEDROL DOSEPACK) 4 mg Oral Tablets, Dose Pack Take as instructed. 08/09/23   McDuffee, Elmyra Ricks, PA-C   Miconazole Nitrate 1,200-2 mg-% Vaginal Kit Insert 1,200 mg into the vagina Once a day 04/22/23   Feliberto Gottron, MD   naproxen (NAPROSYN) 500 mg Oral Tablet Take 1 Tablet (500 mg total) by mouth Every 12 hours as needed for Pain 05/12/23   Madelaine Bhat, DO   ramipriL (ALTACE) 10 mg Oral Capsule Take 1 Capsule (10 mg total) by mouth Once a day    Provider, Historical          ROS:   Review of systems completed and was negative except for what was mentioned in HPI.      Results for orders placed or performed during the hospital encounter of 12/08/23 (from the past 24 hours)   COMPREHENSIVE METABOLIC PANEL, NON-FASTING   Result Value Ref Range    SODIUM 140 136 - 145 mmol/L    POTASSIUM 4.3 3.5 - 5.1 mmol/L    CHLORIDE 106 98 - 107 mmol/L    CO2 TOTAL 23 21 - 31 mmol/L    ANION GAP 11 4 - 13 mmol/L    BUN 6 (L) 7 - 25 mg/dL    CREATININE 8.75 (L) 0.60 - 1.30 mg/dL    BUN/CREA RATIO 13 6 - 22    ESTIMATED GFR 111 >59 mL/min/1.3m^2    ALBUMIN 4.3 3.5 - 5.7 g/dL    CALCIUM 9.7 8.6 - 64.3 mg/dL    GLUCOSE  206 (H) 74 - 109 mg/dL    ALKALINE PHOSPHATASE 133 (H) 34 - 104 U/L    ALT (SGPT) 17 7 - 52 U/L    AST (SGOT) 24 13 - 39 U/L    BILIRUBIN TOTAL 0.4 0.3 - 1.0 mg/dL    PROTEIN TOTAL 7.7 6.4 - 8.9 g/dL    ALBUMIN/GLOBULIN RATIO 1.3 0.8 - 1.4    OSMOLALITY, CALCULATED 283 270 - 290 mOsm/kg    CALCIUM, CORRECTED 9.5 8.9 - 10.8 mg/dL    GLOBULIN  3.4 2.0 - 3.5   TROPONIN NOW   Result Value Ref Range    TROPONIN I 7 <15 ng/L   CBC WITH DIFF   Result Value Ref Range    WBC 8.8 3.8 - 11.8 x10^3/uL    RBC 4.67 3.63 - 4.92 x10^6/uL    HGB 14.8 (H) 10.9 - 14.3 g/dL    HCT 42.5 (H) 95.6 - 41.9 %    MCV 92.5 75.5 - 95.3 fL    MCH 31.7 24.7 - 32.8 pg    MCHC 34.3 32.3 - 35.6 g/dL    RDW 38.7 56.4 - 33.2 %    PLATELETS 269 140 - 440 x10^3/uL    MPV 8.3 7.9 - 10.8 fL    NEUTROPHIL % 59 43 - 77 %    LYMPHOCYTE % 32 16 - 46 %    MONOCYTE % 7 4 - 11 %    EOSINOPHIL % 1 1 - 7 %    BASOPHIL % 1 0 - 1 %    NEUTROPHIL # 5.10 1.90 - 8.20 x10^3/uL    LYMPHOCYTE # 2.80 1.10 - 3.10 x10^3/uL    MONOCYTE # 0.60 0.20 - 0.90 x10^3/uL    EOSINOPHIL # 0.10 0.00 - 0.50 x10^3/uL    BASOPHIL # 0.10 0.00 - 0.10 x10^3/uL   BLUE TOP TUBE   Result Value Ref Range    RAINBOW/EXTRA TUBE AUTO RESULT Yes    TROPONIN IN ONE HOUR   Result Value Ref Range    TROPONIN I 7 <15 ng/L   URINALYSIS, MACROSCOPIC   Result Value Ref Range    COLOR Light Yellow Colorless, Light Yellow, Yellow    APPEARANCE Clear Clear    SPECIFIC GRAVITY 1.043 (H) 1.002 - 1.030    PH 6.0 5.0 - 9.0    LEUKOCYTES Negative Negative, 100  WBCs/uL    NITRITE Negative Negative    PROTEIN Negative Negative, 10 , 20  mg/dL    GLUCOSE >9518 (A) Negative, 30  mg/dL    KETONES Negative Negative, Trace mg/dL    BILIRUBIN Negative Negative, 0.5 mg/dL    BLOOD Negative Negative, 0.03 mg/dL    UROBILINOGEN Normal Normal mg/dL   URINALYSIS, MICROSCOPIC   Result Value Ref Range    RBCS 3 <4 /hpf    WBCS 1 <6 /hpf    SQUAMOUS EPITHELIAL 2 <28 /hpf   LACTIC ACID LEVEL W/ REFLEX FOR LEVEL >2.0   Result Value Ref Range    LACTIC ACID 1.6 0.5 - 2.2 mmol/L          Physical:  Filed Vitals:    12/09/23 0345 12/09/23 0445 12/09/23 0457 12/09/23 0500   BP: (!) 83/58 (!) 80/64  93/74   Pulse: 73 69  79   Resp: (!) 9 12  18    Temp:   36.3 C (97.3 F)    SpO2: 97% 96%  99%      General: Patient is alert and  oriented to person, place, and time. No acute  distress. Communicates appropriately.  Patient was sitting up on side of bed.  No dizziness or unsteadiness noted.  Head: Normocephalic and atraumatic.    Eyes: Pupils equally round and react to light and accommodate. Extraocular movements intact.  Conjunctiva normal. Sclerae are normal.    Nose: Nasal passages clear. Mucosa moist.    Throat: Moist oral mucosa. No erythema or exudate of the pharynx. Clear oropharynx.    Neck: Supple. No cervical lymphadenopathy or supraclavicular nodes detected. Trachea midline   Heart: Regular rate and rhythm. S1 & S2 present. No S3 or S4. No rubs, gallops, or murmurs appreciated.  Radial and dorsalis pedis pulses +2/4 bilaterally.  Brisk capillary refill.    Lungs: Clear/diminished to auscultation bilaterally with no wheezes or rales. Equal chest excursion.  No conversational dyspnea. No respiratory distress noted.  On room air  Abdomen: Soft, nontender, nondistended belly. Bowel sounds are present in all four quadrants. No rigidity.  No guarding.  No ascites.   Extremities: No edema, cyanosis, or clubbing. Grossly moves all extremities.    Skin: Warm and dry without lesions. No ecchymosis noted.    Neurologic: Cranial nerves II through XII are grossly intact. Sensation to light touch is intact. Strength 5/5 in upper extremities and lower extremities bilaterally.    Genitourinary:  No urinary incontinence or Foley catheter   Psychiatric: Judgment and insight are intact. Mood and affect are appropriate for the situation.         Assessments:  Active Hospital Problems   (*Primary Problem)    Diagnosis    *Hypotension    Acute low back pain    Acute pain of right shoulder    Chest pain    Tobacco abuse     Hypotension   Hypotension improved after being given fluid bolus in ER.  Patient did not have positive orthostatics, blood pressure consistent despite position.  Patient had systolic blood pressure in 100s at time of examination.  The patient is started on IV fluid of normal saline  at 100 mL/hour.  Home medications that affect blood pressure will be held at this time.  Orthostatic vital signs ordered Q shift.    2.  Acute low back pain       Acute right shoulder pain       Acute right-sided chest pain   Felt to be due to strain/injury while cleaning out her closet.  Patient admits to lifting and pushing heavy objects.  Blood pressure too low to be given any type of narcotic.  Patient ordered Toradol IV p.r.n..  Chest pain was right-sided with radiation to shoulder.  Pain was sharp, reproducible with movement, felt to be not cardiac related.    3.  Tobacco abuse   Patient states that she smokes about 1 pack per day of cigarettes.  Nicotine patch ordered daily with 1st dose now.    4.  Type 2 diabetes   Sliding scale insulin coverage ordered for control of blood sugar.  Hemoglobin A1c ordered for a.m.[name]    Plan:  Patient will be placed in observation for the above problems.  She will be monitored on telemetry.  Labs ordered for a.m..  Home medications will be resumed as appropriate once confirmed.  Further orders will depend upon clinical course.  The Hospitalist personally evaluated and examined the patient in conjunction with the MLP and agree with the assessments, treatment plan and disposition of the patient as recorded by  the MLP.      Code status: FULL CODE: ATTEMPT RESUSCITATION/CPR  DVT prophylaxis:  Lovenox    Diet: DIET DIABETIC Calorie amount: CC 1800; Do you want to initiate MNT Protocol? Yes    Disposition:  The patient is currently acutely ill requiring treatment on the medical floor for observation. Patient will be closely evaluated monitor and remove be adjusted accordingly.  Estimated length of stay less than 48 hr to obtain full medical treatment.      Stanford Breed, FNP-BC    [facility] MEDICINE HOSPITALIST

## 2023-12-09 NOTE — Discharge Summary (Signed)
 [place] MEDICINE [facility]     DISCHARGE SUMMARY      PATIENT NAME:  Gail Morris   MRN:  [id number]  DOB:  [birth year]    INPATIENT ADMISSION DATE: 12/08/2023   DATE OF DISCHARGE:  12/09/23     ATTENDING PHYSICIAN:  Tarry Kos, MD    HOSPITAL PRESENTATION:  Please see full admission H&P for details.    As per HPI:    58 year old female presented with right-sided chest pain radiating to the right arm and shoulder.    FURTHER HOSPITAL COURSE:    At the time of admission on evaluation the chest pain was thought to be likely musculoskeletal as it was reproducible.  Cardiac enzymes negative with no EKG changes.  Toradol relieved the pain.  And was felt to be not cardiac.  But at the same time she was also found to be hypotensive and required IV fluids.  She was observed overnight and this morning while measuring blood pressure in both arms there was discrepancy close to 30 mmHg in systolic between the right and left.  CT angio was done but did not show any acute abnormality.  This discrepancy could be related to patient having her lymph nodes removed all of them on the right and couple on the left but this has been many years ago.  Patient walked around in the ED with no difficulty.       PROBLEM LIST:  Active Hospital Problems    Diagnosis Date Noted    Principal Problem: Hypotension [I95.9] 12/09/2023    Acute low back pain [M54.50] 12/09/2023    Acute pain of right shoulder [M25.511] 12/09/2023    Chest pain [R07.9] 12/09/2023    Tobacco abuse [Z72.0] 12/09/2023      Resolved Hospital Problems   No resolved problems to display.     There are no active non-hospital problems to display for this patient.      Nutrition:   DIET DIABETIC Calorie amount: CC 1800; Do you want to initiate MNT Protocol? Yes             Additional clinical characteristics related to nutrition:    - monitor for weight changes   - monitor intake and output    - monitor bowel functions        Lab Results   Component Value  Date    ALBUMIN 4.3 12/08/2023        PHYSICAL EXAM DAY OF DISCHARGE:  BP 130/72   Pulse 71   Temp 36.3 C (97.3 F)   Resp 14   Ht 1.575 m (5\' 2" )   Wt 49.9 kg (110 lb)   SpO2 96%   BMI 20.12 kg/m        Physical Exam    LABS WITHIN LAST 24 HOURS:   Results for orders placed or performed during the hospital encounter of 12/08/23 (from the past 24 hours)   ECG 12 LEAD   Result Value Ref Range    Ventricular rate 85 BPM    Atrial Rate 85 BPM    PR Interval 146 ms    QRS Duration 84 ms    QT Interval 386 ms    QTC Calculation 459 ms    Calculated P Axis 67 degrees    Calculated R Axis 46 degrees    Calculated T Axis 63 degrees   COMPREHENSIVE METABOLIC PANEL, NON-FASTING   Result Value Ref Range    SODIUM 140 136 - 145 mmol/L  POTASSIUM 4.3 3.5 - 5.1 mmol/L    CHLORIDE 106 98 - 107 mmol/L    CO2 TOTAL 23 21 - 31 mmol/L    ANION GAP 11 4 - 13 mmol/L    BUN 6 (L) 7 - 25 mg/dL    CREATININE 1.61 (L) 0.60 - 1.30 mg/dL    BUN/CREA RATIO 13 6 - 22    ESTIMATED GFR 111 >59 mL/min/1.52m^2    ALBUMIN 4.3 3.5 - 5.7 g/dL    CALCIUM 9.7 8.6 - 09.6 mg/dL    GLUCOSE 045 (H) 74 - 109 mg/dL    ALKALINE PHOSPHATASE 133 (H) 34 - 104 U/L    ALT (SGPT) 17 7 - 52 U/L    AST (SGOT) 24 13 - 39 U/L    BILIRUBIN TOTAL 0.4 0.3 - 1.0 mg/dL    PROTEIN TOTAL 7.7 6.4 - 8.9 g/dL    ALBUMIN/GLOBULIN RATIO 1.3 0.8 - 1.4    OSMOLALITY, CALCULATED 283 270 - 290 mOsm/kg    CALCIUM, CORRECTED 9.5 8.9 - 10.8 mg/dL    GLOBULIN 3.4 2.0 - 3.5   TROPONIN NOW   Result Value Ref Range    TROPONIN I 7 <15 ng/L   CBC WITH DIFF   Result Value Ref Range    WBC 8.8 3.8 - 11.8 x10^3/uL    RBC 4.67 3.63 - 4.92 x10^6/uL    HGB 14.8 (H) 10.9 - 14.3 g/dL    HCT 40.9 (H) 81.1 - 41.9 %    MCV 92.5 75.5 - 95.3 fL    MCH 31.7 24.7 - 32.8 pg    MCHC 34.3 32.3 - 35.6 g/dL    RDW 91.4 78.2 - 95.6 %    PLATELETS 269 140 - 440 x10^3/uL    MPV 8.3 7.9 - 10.8 fL    NEUTROPHIL % 59 43 - 77 %    LYMPHOCYTE % 32 16 - 46 %    MONOCYTE % 7 4 - 11 %    EOSINOPHIL % 1 1 - 7 %     BASOPHIL % 1 0 - 1 %    NEUTROPHIL # 5.10 1.90 - 8.20 x10^3/uL    LYMPHOCYTE # 2.80 1.10 - 3.10 x10^3/uL    MONOCYTE # 0.60 0.20 - 0.90 x10^3/uL    EOSINOPHIL # 0.10 0.00 - 0.50 x10^3/uL    BASOPHIL # 0.10 0.00 - 0.10 x10^3/uL   BLUE TOP TUBE   Result Value Ref Range    RAINBOW/EXTRA TUBE AUTO RESULT Yes    TROPONIN IN ONE HOUR   Result Value Ref Range    TROPONIN I 7 <15 ng/L   URINALYSIS, MACROSCOPIC   Result Value Ref Range    COLOR Light Yellow Colorless, Light Yellow, Yellow    APPEARANCE Clear Clear    SPECIFIC GRAVITY 1.043 (H) 1.002 - 1.030    PH 6.0 5.0 - 9.0    LEUKOCYTES Negative Negative, 100  WBCs/uL    NITRITE Negative Negative    PROTEIN Negative Negative, 10 , 20  mg/dL    GLUCOSE >2130 (A) Negative, 30  mg/dL    KETONES Negative Negative, Trace mg/dL    BILIRUBIN Negative Negative, 0.5 mg/dL    BLOOD Negative Negative, 0.03 mg/dL    UROBILINOGEN Normal Normal mg/dL   URINALYSIS, MICROSCOPIC   Result Value Ref Range    RBCS 3 <4 /hpf    WBCS 1 <6 /hpf    SQUAMOUS EPITHELIAL 2 <28 /hpf   LACTIC ACID LEVEL W/ REFLEX  FOR LEVEL >2.0   Result Value Ref Range    LACTIC ACID 1.6 0.5 - 2.2 mmol/L   POC BLOOD GLUCOSE (RESULTS)   Result Value Ref Range    GLUCOSE, POC 103 (H) 70 - 100 mg/dl   POC BLOOD GLUCOSE (RESULTS)   Result Value Ref Range    GLUCOSE, POC 140 (H) 70 - 100 mg/dl        IMAGING WITHIN LAST 24 HOURS:   No results found.  CT ANGIO CHEST W IV CONTRAST   Final Result   UNREMARKABLE CHEST CTA. NO PULMONARY EMBOLUS. THE LUNGS ARE CLEAR            One or more dose reduction techniques were used (e.g., Automated exposure control, adjustment of the mA and/or kV according to patient size, use of iterative reconstruction technique).         Radiologist location ID: [id number]         XR AP MOBILE CHEST   Final Result   NO ACUTE FINDINGS.            Radiologist location ID: [id number]              MICROBIOLOGY WITHIN LAST 24 HOURS:   No results found for any visits on 12/08/23 (from the past 24  hours).     DISCHARGE MEDICATIONS:     Current Discharge Medication List        CONTINUE these medications - NO CHANGES were made during your visit.        Details   aspirin 81 mg Tablet, Chewable   81 mg, DAILY  Refills: 0     empagliflozin 25 mg Tablet  Commonly known as: JARDIANCE   25 mg, DAILY  Refills: 0     ergocalciferol (vitamin D2) 1,250 mcg (50,000 unit) Capsule  Commonly known as: DRISDOL   50,000 Units, EVERY 7 DAYS  Refills: 0     fluticasone propionate 50 mcg/actuation Spray, Suspension  Commonly known as: FLONASE   1 Spray, DAILY PRN  Refills: 0     * gabapentin 800 mg Tablet  Commonly known as: NEURONTIN   800 mg, 3 TIMES DAILY  Refills: 0     * gabapentin 100 mg Capsule  Commonly known as: NEURONTIN   100 mg, 3 TIMES DAILY  Refills: 0     hydrOXYzine HCL 50 mg Tablet  Commonly known as: ATARAX   50 mg, 4 TIMES DAILY PRN  Refills: 0     Nurtec ODT 75 mg Tablet, Rapid Dissolve  Generic drug: rimegepant   75 mg, EVERY 24 HOURS PRN  Refills: 0     ramipriL 10 mg Capsule  Commonly known as: ALTACE   10 mg, DAILY  Refills: 0     simvastatin 10 mg Tablet  Commonly known as: ZOCOR   SMARTSIG:1 Tablet(s) By Mouth Every Evening  Refills: 0     Toujeo Max U-300 SoloStar 300 unit/mL (3 mL) Insulin Pen  Generic drug: insulin glargine U-300 conc   55 Units, DAILY  Refills: 0     Ventolin HFA 90 mcg/actuation oral inhaler  Generic drug: albuterol sulfate   2 Puffs, EVERY 4 HOURS PRN  Refills: 0           * This list has 2 medication(s) that are the same as other medications prescribed for you. Read the directions carefully, and ask your doctor or other care provider to review them with you.  DISCHARGE DISPOSITION:  Home    DISCHARGE INSTRUCTIONS:  No discharge procedures on file.         Copies sent to Care Team         Relationship Specialty Notifications Start End    Mariah Milling, DO PCP - General FAMILY MEDICINE  12/28/21     Phone: [phone] Fax: [phone]         [address]  [place] [address]            The hospitalist examined patient, reviewed material, and agreed with discharge at this time.   >30 minutes total were spent coordinating discharge day today    Dijon Kohlman Jan Fireman, MD  [facility] Medicine   12/09/2023      This note was partially created using voice recognition software and is inherently subject to errors including those of syntax and "sound alike " substitutions which may escape proof reading. In such instances, original meaning may be extrapolated by contextual derivation.

## 2023-12-09 NOTE — ED Nurses Note (Signed)
 Patient suggestive of taking blood pressure in left lower arm, where patient says "they always take it there." Patient hx of lymph node removal in left axilla region. Blood pressure on left forearm reads 173/98. Blood pressure taken in right lower leg that reads 175/93. Provider Minta Balsam made aware of situation. Right arm has been used for blood pressure purposes up to this point due to hx of lymph node removal more recent on the left side. Blood pressure cuff remains in place on right lower leg.

## 2023-12-31 ENCOUNTER — Encounter (HOSPITAL_COMMUNITY): Payer: Self-pay | Admitting: Family Medicine

## 2023-12-31 ENCOUNTER — Emergency Department
Admission: EM | Admit: 2023-12-31 | Discharge: 2023-12-31 | Disposition: A | Discharge status: Home / Self Care | Payer: MEDICAID | Attending: Family Medicine | Admitting: Family Medicine

## 2023-12-31 ENCOUNTER — Other Ambulatory Visit: Payer: Self-pay

## 2023-12-31 ENCOUNTER — Emergency Department (HOSPITAL_COMMUNITY): Payer: MEDICAID

## 2023-12-31 DIAGNOSIS — Z1152 Encounter for screening for COVID-19: Secondary | ICD-10-CM | POA: Insufficient documentation

## 2023-12-31 DIAGNOSIS — J069 Acute upper respiratory infection, unspecified: Secondary | ICD-10-CM | POA: Insufficient documentation

## 2023-12-31 LAB — COVID-19, FLU A/B, RSV RAPID BY PCR
INFLUENZA VIRUS TYPE A: NOT DETECTED
INFLUENZA VIRUS TYPE B: NOT DETECTED
RESPIRATORY SYNCTIAL VIRUS (RSV): NOT DETECTED
SARS-CoV-2: NOT DETECTED

## 2023-12-31 LAB — RAPID THROAT SCREEN, STREPTOCOCCUS, WITH REFLEX: THROAT RAPID SCREEN, STREPTOCOCCUS: NEGATIVE

## 2023-12-31 NOTE — ED Provider Notes (Shared)
 [facility]  ED Primary Provider Note  Patient Name: Gail Morris  Patient Age: 58 y.o.  Date of Birth: [birth year]    Chief Complaint: Flu Like Symptoms        History of Present Illness       Gail Morris is a 58 y.o. female who had concerns including Flu Like Symptoms. ***        Review of Systems     No other overt Review of Systems are noted to be positive except noted in the HPI.    { Be sure to review and modify ROS as appropriate. As of Oct 14, 2021 you are only required to have a "medically appropriate" ROS and PE for billing purposes. This help text will disappear when signing your note.:123}  Historical Data   History Reviewed This Encounter: Medical History  Surgical History  Family History  Social History      Physical Exam   ED Triage Vitals [12/31/23 1957]   BP (Non-Invasive) (!) 154/89   Heart Rate (!) 120   Respiratory Rate 20   Temperature 37.3 C (99.1 F)   SpO2 97 %   Weight 49.9 kg (110 lb)   Height 1.575 m (5\' 2" )     {Be sure to review and modify Physical Exam as appropriate. As of Oct 14, 2021 you are only required to have a "medically appropriate" ROS and PE for billing purposes. This help text will disappear when signing your note.:123}    Nursing notes reviewed for what could be assessed. Past Medical, Surgical, and Social history reviewed for what has been completed.     Constitutional: NAD. Well-Developed. Well Nourished.  Head: Normocephalic, atraumatic.  Mouth/Throat: no nasal discharge, posterior pharynx WNL  Eyes: EOM grossly intact, conjunctiva normal.  Neck: Supple  Cardiovascular: Regular Rate and Rhythm, extremities well perfused.  Pulmonary/Chest: No respiratory distress. Lungs are symmetric to auscultation bilaterally.  Abdominal: Soft, non-tender, non-distended. Non peritoneal, no rebound, no guarding.  MSK: No Lower Extremity Edema.  Skin: Warm, dry, and intact  Neuro: Appropriate, CN II-XII grossly intact   Psych: Cooperative            Procedures      Patient Data   {Click here to open the ED Workup Activity for clinical data review *This link will automatically disappear upon signing your note*:123}  Labs Ordered/Reviewed   RAPID THROAT SCREEN, STREPTOCOCCUS, WITH REFLEX - Normal    Narrative:     Walk-Away Mode   COVID-19, FLU A/B, RSV RAPID BY PCR - Normal    Narrative:     Results are for the simultaneous qualitative identification of SARS-CoV-2 (formerly 2019-nCoV), Influenza A, Influenza B, and RSV RNA. These etiologic agents are generally detectable in nasopharyngeal and nasal swabs during the ACUTE PHASE of infection. Hence, this test is intended to be performed on respiratory specimens collected from individuals with signs and symptoms of upper respiratory tract infection who meet Centers for Disease Control and Prevention (CDC) clinical and/or epidemiological criteria for Coronavirus Disease 2019 (COVID-19) testing. CDC COVID-19 criteria for testing on human specimens is available at [facility] webpage information for Healthcare Professionals: Coronavirus Disease 2019 (COVID-19) ([employer]).     False-negative results may occur if the virus has genomic mutations, insertions, deletions, or rearrangements or if performed very early in the course of illness. Otherwise, negative results indicate virus specific RNA targets are not detected, however negative results do not preclude SARS-CoV-2 infection/COVID-19, Influenza, or Respiratory  syncytial virus infection. Results should not be used as the sole basis for patient management decisions. Negative results must be combined with clinical observations, patient history, and epidemiological information. If upper respiratory tract infection is still suspected based on exposure history together with other clinical findings, re-testing should be considered.    Test methodology:   Cepheid Xpert Xpress SARS-CoV-2/Flu/RSV Assay real-time polymerase  chain reaction (RT-PCR) test on the GeneXpert Dx and Xpert Xpress systems.   THROAT CULTURE, BETA HEMOLYTIC STREPTOCOCCUS       XR CHEST PA AND LATERAL   Final Result by Edi, Radresults In (03/19 2016)   NO ACUTE FINDINGS.         Radiologist location ID: [id number]             Medical Decision Making        {Be sure to fill out the MDM SmartBlock in Notewriter to the left. Do not modify this italicized text, it will disappear upon signing your note:123}  Medical Decision Making        Studies Assessed: ***    EKG:   This EKG interpreted by me shows:    Rate: ***    Interpretation: ***      MDM Narrative:  ***      ED Course as of 12/31/23 2134   Wed Dec 31, 2023   2037 CHEST X-RAY REVEALED:   FINDINGS:  Surgical clips over the right side of the chest.     The heart size is normal.  The mediastinal contour is unremarkable.  The lungs are clear.   Degenerative changes are present in the thoracic spine.          IMPRESSION:  NO ACUTE FINDINGS.   2039 RAPID STREP SCREEN WAS NEGATIVE   2110 COVID-19, RSV, INFLUENZA TESTING WAS NEGATIVE              Following the history, physical exam, and ED workup, the patient was deemed stable and suitable for discharge. The patient/caregiver was advised to return to the ED for any new or worsening symptoms. Discharge medications, and follow-up instructions were discussed with the patient/caregiver in detail, who verbalizes understanding. The patient/caregiver is in agreement and is comfortable with the plan of care.    Disposition: Discharged         Current Discharge Medication List        CONTINUE these medications - NO CHANGES were made during your visit.        Details   aspirin 81 mg Tablet, Chewable   81 mg, Daily  Refills: 0     empagliflozin 25 mg Tablet  Commonly known as: JARDIANCE   25 mg, Daily  Refills: 0     ergocalciferol (vitamin D2) 1,250 mcg (50,000 unit) Capsule  Commonly known as: DRISDOL   50,000 Units, EVERY 7 DAYS  Refills: 0     fluticasone propionate 50  mcg/actuation Spray, Suspension  Commonly known as: FLONASE   1 Spray, DAILY PRN  Refills: 0     * gabapentin 800 mg Tablet  Commonly known as: NEURONTIN   800 mg, 3 TIMES DAILY  Refills: 0     * gabapentin 100 mg Capsule  Commonly known as: NEURONTIN   100 mg, 3 TIMES DAILY  Refills: 0     hydrOXYzine HCL 50 mg Tablet  Commonly known as: ATARAX   50 mg, 4 TIMES DAILY PRN  Refills: 0     Nurtec ODT 75 mg Tablet, Rapid Dissolve  Generic drug: rimegepant   75 mg, EVERY 24 HOURS PRN  Refills: 0     ramipriL 10 mg Capsule  Commonly known as: ALTACE   10 mg, Daily  Refills: 0     simvastatin 10 mg Tablet  Commonly known as: ZOCOR   SMARTSIG:1 Tablet(s) By Mouth Every Evening  Refills: 0     Toujeo Max U-300 SoloStar 300 unit/mL (3 mL) Insulin Pen  Generic drug: insulin glargine U-300 conc   55 Units, Daily  Refills: 0     Ventolin HFA 90 mcg/actuation oral inhaler  Generic drug: albuterol sulfate   2 Puffs, EVERY 4 HOURS PRN  Refills: 0           * This list has 2 medication(s) that are the same as other medications prescribed for you. Read the directions carefully, and ask your doctor or other care provider to review them with you.                Follow up:   Mariah Milling, DO  [address]  [place] [address]  [phone]    In 1 day      [facility] - Emergency Department  [address]  [place] [address]  [phone]    As needed, If symptoms worsen               Clinical Impression   Acute upper respiratory infection (Primary)         Current Discharge Medication List            Tawanna Sat, DO              {Remember to refresh your note prior to signing. Use Control + F11 or click the refresh button at the bottom of the note. This reminder text will automatically disappear when you sign your note.:123}

## 2023-12-31 NOTE — ED Nurses Note (Signed)
 Patient discharged from waiting room prior to bed assignment. No primary nurse assigned so no assessment completed. Respirations even and unlabored at time of D/C, VSS. Patient able to ambulate out of department without further concern.

## 2023-12-31 NOTE — ED Notes (Signed)
 [facility] - Emergency Department  Peer Recovery Coach Assessment    Initial Evaluation  Referred by:: Nurse  Location of Evaluation: Emergency Department  How many times in the last 12 months have you been to the ED?: 6 or more  Have you ever served or are you currently serving in the Armed Forces?: No             Substance Use History  Patient current substance use status: Patient states on the audit c that she has used marijuana in the past 12 months.              Drug route of administration: Smoked                   Family, Social, Home & Safety History  Marital Status: Divorced            Need to improve relationships with family?: No               Contact phone number for the patient: [phone]  Emergency contact name and phone number: Leta Jungling (Sister)  [phone]              Employment            Engagement  Readiness ruler: 1    Brief Intervention  Discussed plan to reduce/quit substance use?: No  Discussed willingness to enter treatment?: No  Indicated patient's stage of change:: 1 - Precontemplation    Patient seen by Peer Recovery Coach and is a candidate for buprenorphine administration in the ED. Patient needs assessment for bup treatment.: No    Plan  Was the patient referred to treatment?: No    Was patient referred to physician for Buprenorphine Assessment in the ED?: No    Did patient receive Narcan in the ED?: No         Follow-up           Need for additional follow-up?: No       Trina Ao, Peer Recovery Coach 12/31/2023 20:09

## 2023-12-31 NOTE — ED Triage Notes (Signed)
 Cough, congestion, body aches and fever x two days, motrin taken at 1300

## 2024-01-02 LAB — THROAT CULTURE, BETA HEMOLYTIC STREPTOCOCCUS: THROAT CULTURE: NORMAL

## 2024-01-03 ENCOUNTER — Other Ambulatory Visit: Payer: Self-pay

## 2024-01-03 ENCOUNTER — Emergency Department
Admission: EM | Admit: 2024-01-03 | Discharge: 2024-01-04 | Disposition: A | Discharge status: Home / Self Care | Payer: MEDICAID | Source: Home / Self Care | Attending: Emergency Medicine | Admitting: Emergency Medicine

## 2024-01-03 ENCOUNTER — Emergency Department (HOSPITAL_COMMUNITY): Payer: MEDICAID

## 2024-01-03 DIAGNOSIS — F17210 Nicotine dependence, cigarettes, uncomplicated: Secondary | ICD-10-CM | POA: Insufficient documentation

## 2024-01-03 DIAGNOSIS — Z8659 Personal history of other mental and behavioral disorders: Secondary | ICD-10-CM | POA: Insufficient documentation

## 2024-01-03 DIAGNOSIS — E119 Type 2 diabetes mellitus without complications: Secondary | ICD-10-CM | POA: Insufficient documentation

## 2024-01-03 DIAGNOSIS — Z7984 Long term (current) use of oral hypoglycemic drugs: Secondary | ICD-10-CM | POA: Insufficient documentation

## 2024-01-03 DIAGNOSIS — Z794 Long term (current) use of insulin: Secondary | ICD-10-CM | POA: Insufficient documentation

## 2024-01-03 DIAGNOSIS — G43909 Migraine, unspecified, not intractable, without status migrainosus: Secondary | ICD-10-CM

## 2024-01-03 DIAGNOSIS — F32A Depression, unspecified: Secondary | ICD-10-CM

## 2024-01-03 DIAGNOSIS — F419 Anxiety disorder, unspecified: Secondary | ICD-10-CM

## 2024-01-03 DIAGNOSIS — J101 Influenza due to other identified influenza virus with other respiratory manifestations: Secondary | ICD-10-CM | POA: Insufficient documentation

## 2024-01-03 DIAGNOSIS — Z1152 Encounter for screening for COVID-19: Secondary | ICD-10-CM | POA: Insufficient documentation

## 2024-01-03 MED ORDER — ACETAMINOPHEN 325 MG TABLET
ORAL_TABLET | ORAL | Status: AC
Start: 2024-01-03 — End: 2024-01-03
  Filled 2024-01-03: qty 2

## 2024-01-03 MED ORDER — ACETAMINOPHEN 325 MG TABLET
650.0000 mg | ORAL_TABLET | ORAL | Status: AC
Start: 2024-01-03 — End: 2024-01-03
  Administered 2024-01-03: 650 mg via ORAL

## 2024-01-03 NOTE — ED APP Handoff Note (Signed)
 [facility] - Emergency Department  Emergency Department  Provider in Triage Note    Name: DEVANEE Morris  Age: 58 y.o.  Gender: female     Subjective:   DEMONI Morris is a 58 y.o. female who presents with complaint of Flu Like Symptoms  .  Patient here with flu like symptoms x 1 week    Objective:   Filed Vitals:    01/03/24 2300   BP: (!) 166/96   Pulse: (!) 113   Resp: 20   Temp: 37.9 C (100.3 F)   SpO2: 96%        Assessment:  A medical screening exam was completed.  This patient is a 58 y.o. female with initial findings showing flu like symptoms     Plan:  Please see initial orders and work-up below.  This is to be continued with full evaluation in the main Emergency Department.     No current facility-administered medications for this encounter.     No results found for this or any previous visit (from the past 24 hours).     Gail Spare, FNP, ENP-C  01/03/2024, 23:01

## 2024-01-03 NOTE — ED Triage Notes (Signed)
 General weakness, cough and congestion x 1 week. Fever.

## 2024-01-04 ENCOUNTER — Emergency Department (HOSPITAL_COMMUNITY): Payer: MEDICAID

## 2024-01-04 ENCOUNTER — Encounter (HOSPITAL_COMMUNITY): Payer: Self-pay | Admitting: Emergency Medicine

## 2024-01-04 LAB — COMPREHENSIVE METABOLIC PANEL, NON-FASTING
ALBUMIN/GLOBULIN RATIO: 1.1 (ref 0.8–1.4)
ALBUMIN: 4.4 g/dL (ref 3.5–5.7)
ALKALINE PHOSPHATASE: 126 U/L — ABNORMAL HIGH (ref 34–104)
ALT (SGPT): 21 U/L (ref 7–52)
ANION GAP: 12 mmol/L (ref 4–13)
AST (SGOT): 27 U/L (ref 13–39)
BILIRUBIN TOTAL: 0.5 mg/dL (ref 0.3–1.0)
BUN/CREA RATIO: 21 (ref 6–22)
BUN: 12 mg/dL (ref 7–25)
CALCIUM, CORRECTED: 9.4 mg/dL (ref 8.9–10.8)
CALCIUM: 9.7 mg/dL (ref 8.6–10.3)
CHLORIDE: 99 mmol/L (ref 98–107)
CO2 TOTAL: 24 mmol/L (ref 21–31)
CREATININE: 0.58 mg/dL — ABNORMAL LOW (ref 0.60–1.30)
ESTIMATED GFR: 106 mL/min/{1.73_m2} (ref >59)
GLOBULIN: 4.1 — ABNORMAL HIGH (ref 2.0–3.5)
GLUCOSE: 334 mg/dL — ABNORMAL HIGH (ref 74–109)
OSMOLALITY, CALCULATED: 283 mosm/kg (ref 270–290)
POTASSIUM: 3.8 mmol/L (ref 3.5–5.1)
PROTEIN TOTAL: 8.5 g/dL (ref 6.4–8.9)
SODIUM: 135 mmol/L — ABNORMAL LOW (ref 136–145)

## 2024-01-04 LAB — COVID-19, FLU A/B, RSV RAPID BY PCR
INFLUENZA VIRUS TYPE A: DETECTED — AB
INFLUENZA VIRUS TYPE B: NOT DETECTED
RESPIRATORY SYNCTIAL VIRUS (RSV): NOT DETECTED
SARS-CoV-2: NOT DETECTED

## 2024-01-04 LAB — CBC WITH DIFF
BASOPHIL #: 0.1 10*3/uL (ref 0.00–0.10)
BASOPHIL %: 1 % (ref 0–1)
EOSINOPHIL #: 0 10*3/uL (ref 0.00–0.50)
EOSINOPHIL %: 0 % — ABNORMAL LOW (ref 1–7)
HCT: 44.7 % — ABNORMAL HIGH (ref 31.2–41.9)
HGB: 15.3 g/dL — ABNORMAL HIGH (ref 10.9–14.3)
LYMPHOCYTE #: 1.6 10*3/uL (ref 1.10–3.10)
LYMPHOCYTE %: 19 % (ref 16–46)
MCH: 31.2 pg (ref 24.7–32.8)
MCHC: 34.4 g/dL (ref 32.3–35.6)
MCV: 90.8 fL (ref 75.5–95.3)
MONOCYTE #: 0.9 10*3/uL (ref 0.20–0.90)
MONOCYTE %: 10 % (ref 4–11)
MPV: 8.5 fL (ref 7.9–10.8)
NEUTROPHIL #: 6 10*3/uL (ref 1.90–8.20)
NEUTROPHIL %: 70 % (ref 43–77)
PLATELETS: 212 10*3/uL (ref 140–440)
RBC: 4.92 10*6/uL (ref 3.63–4.92)
RDW: 13.9 % (ref 12.3–17.7)
WBC: 8.6 10*3/uL (ref 3.8–11.8)

## 2024-01-04 LAB — GOLD TOP TUBE

## 2024-01-04 LAB — LACTIC ACID LEVEL W/ REFLEX FOR LEVEL >2.0: LACTIC ACID: 2.5 mmol/L — ABNORMAL HIGH (ref 0.5–2.2)

## 2024-01-04 LAB — MAGNESIUM: MAGNESIUM: 1.8 mg/dL — ABNORMAL LOW (ref 1.9–2.7)

## 2024-01-04 LAB — BLUE TOP TUBE

## 2024-01-04 LAB — THYROID STIMULATING HORMONE (SENSITIVE TSH): TSH: 3.184 u[IU]/mL (ref 0.450–5.330)

## 2024-01-04 MED ORDER — IBUPROFEN 400 MG TABLET
400.0000 mg | ORAL_TABLET | Freq: Four times a day (QID) | ORAL | 0 refills | Status: AC | PRN
Start: 2024-01-04 — End: 2024-01-11

## 2024-01-04 MED ORDER — PROCHLORPERAZINE MALEATE 10 MG TABLET
10.0000 mg | ORAL_TABLET | Freq: Four times a day (QID) | ORAL | 0 refills | Status: DC | PRN
Start: 2024-01-04 — End: 2024-03-05

## 2024-01-04 MED ORDER — OSELTAMIVIR 75 MG CAPSULE
75.0000 mg | ORAL_CAPSULE | Freq: Two times a day (BID) | ORAL | 0 refills | Status: AC
Start: 2024-01-04 — End: 2024-01-14

## 2024-01-04 MED ORDER — ACETAMINOPHEN 500 MG TABLET
1000.0000 mg | ORAL_TABLET | Freq: Four times a day (QID) | ORAL | 0 refills | Status: AC | PRN
Start: 2024-01-04 — End: ?

## 2024-01-04 NOTE — Discharge Instructions (Addendum)
 Tamiflu  twice a day for 5 days   Increase your fluids   Tylenol  every 6 hours alternating with Motrin  every 6 hours for pain or fever  Compazine  every 6 hours needed for nausea and vomiting  Return to the ER for any emergencies   See your PCP within 1 week

## 2024-01-04 NOTE — ED Notes (Signed)
 [facility] - Emergency Department  Peer Recovery Coach Assessment    Initial Evaluation  Referred by:: Nurse  Location of Evaluation: Emergency Department  How many times in the last 12 months have you been to the ED?: 6 or more  Have you ever served or are you currently serving in the Armed Forces?: No             Substance Use History  Patient current substance use status: Missed pt on 3/22 @ 10:57pm.  Patient states on the audit c that she has used marijuana in the past 12 months.  Patient discharged home.              Drug route of administration: Smoked                   Family, Social, Home & Safety History                                                 Employment            Engagement  Readiness ruler: 1    Brief Intervention  Discussed plan to reduce/quit substance use?: No  Discussed willingness to enter treatment?: No  Indicated patient's stage of change:: 1 - Precontemplation    Patient seen by Peer Recovery Coach and is a candidate for buprenorphine  administration in the ED. Patient needs assessment for bup treatment.: No    Plan  Was the patient referred to treatment?: No    Was patient referred to physician for Buprenorphine  Assessment in the ED?: No    Did patient receive Narcan in the ED?: No         Follow-up           Need for additional follow-up?: No       Bess Broody, Peer Recovery Coach 01/04/2024 11:25

## 2024-01-04 NOTE — ED Provider Notes (Signed)
 [facility]  ED Primary Provider Note  Patient Name: Gail Morris  Patient Age: 58 y.o.  Date of Birth: 1966/06/21    Chief Complaint: Flu Like Symptoms        History of Present Illness       Gail Morris is a 58 y.o. female who had concerns including Flu Like Symptoms.     HPI     chief complaint flu-like symptoms.      HPI   Onset acute   Timing 1 week but it has been much worse over the past 24 hours   Severity moderate.  T-max 101   Duration constant with intermittent waxing and waning   Recent medical history:  Patient was seen here on 12/31/2023.  She was also seen at her PCP's office on Friday.  Both times that she was seen she was negative for influenza.  Mechanism: Patient has been exposed to influenza.  Today is day 2 of her real symptoms.  Additional symptoms: Patient has nausea no vomiting.  She is using Zofran  without significant help.  She states it is only 4 mg wears off too soon.  She does have temperature of 101 at home.  She also has chills.  She has had increased sleep at home but she is still drinking well.  No seizure activity.  She has no abdominal pain.  She has no diarrhea  PMH: reviewed and listed on the chart below.   SH: reviewed and listed on the chart below.   Social History: reviewed and listed on the chart below.  When asked, the patient is 1st name came from a nurse in [place] that named her when she was born.  Patient continues to smoke.  She does use marijuana products.  No alcohol.  No hard drugs  Family History: reviewed and listed on the chart below.       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    Anxiety     Breast cancer (CMS HCC)     Cancer (CMS HCC)     Depression     Diabetes mellitus, type 2 (CMS HCC)     Esophageal reflux     H/O blood clots     H/O foot surgery     Hiccups     HTN (hypertension)     Migraine     Neuropathy (CMS HCC)     Primary generalized  (osteo)arthritis     PSHx:   Past Surgical History:   Procedure Laterality Date    HAND SURGERY      HX BACK SURGERY      HX BREAST RECONSTRUCTION      HX CESAREAN SECTION      HX CHOLECYSTECTOMY      SHOULDER ADHESION RELEASE Left     SIMPLE MASTECTOMY Bilateral        Allergies:    Allergies   Allergen Reactions    Metformin  Other Adverse Reaction (Add comment)     "Dehydrates me"    Tramadol  Other Adverse Reaction (Add comment)     Tachycardia      Trulicity [Dulaglutide]  Other Adverse Reaction (Add comment)     Loss of appetite    Social History  Social History     Tobacco Use    Smoking status: Every Day     Current  packs/day: 1.00     Types: Cigarettes    Smokeless tobacco: Never   Vaping Use    Vaping status: Never Used   Substance Use Topics    Alcohol use: Never    Drug use: Yes     Types: Marijuana     Comment: USED AROUND 3 DAYS AGO       Family History  Family Medical History:       Problem Relation (Age of Onset)    Diabetes type II Mother    No Known Problems Father               Home Meds:      Prior to Admission medications    Medication Sig Start Date End Date Taking? Authorizing Provider   acetaminophen  (TYLENOL ) 500 mg Oral Tablet Take 2 Tablets (1,000 mg total) by mouth Every 6 hours as needed for Pain 01/04/24  Yes Larue Pock, DO   aspirin  81 mg Oral Tablet, Chewable Chew 1 Tablet (81 mg total) Once a day    Provider, Historical   empagliflozin (JARDIANCE) 25 mg Oral Tablet Take 1 Tablet (25 mg total) by mouth Once a day    Provider, Historical   ergocalciferol, vitamin D2, (DRISDOL) 1,250 mcg (50,000 unit) Oral Capsule Take 1 Capsule (50,000 Units total) by mouth Every 7 days 08/26/23   Provider, Historical   fluticasone propionate (FLONASE) 50 mcg/actuation Nasal Spray, Suspension Administer 1 Spray into each nostril Once per day as needed 09/24/23   Provider, Historical   gabapentin (NEURONTIN) 100 mg Oral Capsule 1 Capsule (100 mg total) Three times a day 03/18/22   Provider, Historical    gabapentin (NEURONTIN) 800 mg Oral Tablet Take 1 Tablet (800 mg total) by mouth Three times a day    Provider, Historical   hydrOXYzine HCL (ATARAX) 50 mg Oral Tablet Take 1 Tablet (50 mg total) by mouth Four times a day as needed 08/26/23   Provider, Historical   Ibuprofen  (MOTRIN ) 400 mg Oral Tablet Take 1 Tablet (400 mg total) by mouth Every 6 hours as needed for Pain for up to 7 days 01/04/24 01/11/24 Yes Larue Pock, DO   NURTEC ODT 75 mg Oral Tablet, Rapid Dissolve Place 1 Tablet (75 mg total) under the tongue Every 24 hours as needed 08/18/23   Provider, Historical   oseltamivir  (TAMIFLU ) 75 mg Oral Capsule Take 1 Capsule (75 mg total) by mouth Twice daily for 10 days 01/04/24 01/14/24 Yes Larue Pock, DO   prochlorperazine  (COMPAZINE ) 10 mg Oral Tablet Take 1 Tablet (10 mg total) by mouth Every 6 hours as needed for Nausea/Vomiting 01/04/24  Yes Larue Pock, DO   ramipriL (ALTACE) 10 mg Oral Capsule Take 1 Capsule (10 mg total) by mouth Once a day    Provider, Historical   simvastatin (ZOCOR) 10 mg Oral Tablet SMARTSIG:1 Tablet(s) By Mouth Every Evening 08/26/23   Provider, Historical   TOUJEO  MAX U-300 SOLOSTAR 300 unit/mL (3 mL) Subcutaneous Insulin  Pen Inject 55 Units under the skin Once a day 10/13/23   Provider, Historical   VENTOLIN  HFA 90 mcg/actuation Inhalation oral inhaler Take 2 Puffs by inhalation Every 4 hours as needed 08/26/23   Provider, Historical   benzonatate  (TESSALON ) 100 mg Oral Capsule Take 1 Capsule (100 mg total) by mouth Every 8 hours as needed for Cough 08/31/22 12/09/23  Quick, Cydney, FNP-BC   guaiFENesin  (MUCINEX ) 600 mg Oral Tablet Extended Release 12hr Take 1 Tablet (600 mg total) by mouth Every 12  hours 08/31/22 12/09/23  Quick, Tressia Fry, FNP-BC   HYDROcodone -acetaminophen  (NORCO) 7.5-325 mg Oral Tablet Take 1 Tablet by mouth Every 4 hours as needed for Pain for up to 10 doses 10/16/23 12/09/23  Larue Pock, DO   insulin  glargine 100 unit/mL Subcutaneous injection (vial) Inject 40  Units under the skin Every night  12/09/23  Provider, Historical   Methylprednisolone  (MEDROL  DOSEPACK) 4 mg Oral Tablets, Dose Pack Take as instructed. 08/09/23 12/09/23  McDuffee, Loreda Rodriguez, PA-C   Miconazole  Nitrate 1,200-2 mg-% Vaginal Kit Insert 1,200 mg into the vagina Once a day 04/22/23 12/09/23  Oteng, Eugene Kojo, MD   naproxen  (NAPROSYN ) 500 mg Oral Tablet Take 1 Tablet (500 mg total) by mouth Every 12 hours as needed for Pain 05/12/23 12/09/23  Jodelle Mungo, DO          ROS: A total of 10 systems were reviewed by me at the time of the visit. All are negative except the HPI and the following.           Physical Exam   ED Triage Vitals [01/03/24 2300]   BP (Non-Invasive) (!) 166/96   Heart Rate (!) 113   Respiratory Rate 20   Temperature 37.9 C (100.3 F)   SpO2 96 %   Weight 49.9 kg (110 lb)   Height 1.575 m (5\' 2" )         Physical exam  Constitutional/general: The nursing notes were reviewed and agreed upon.  The vital signs were reviewed and are listed on the chart.   Very pleasant 59 year old female.  She is currently in no acute distress.  Patient appears to be comfortable and in no pain.  However, she appears to feel very bad.  HEENT: Eyes show normal extraocular movements.  Well-hydrated oral mucosa is noted.  There is no facial trauma or abnormalities.  Neck: No midline tenderness, no meningeal signs.  Full range of motion.  No JVD.    Cardiovascular: Heart is regular rate and rhythm S1-S2 sounds were auscultated without murmur click or rub  Respiratory: Lungs are clear to auscultation in all fields without wheeze rale or rhonchi.  GI: Abdomen is soft non tender normal bowel sounds are auscultated.  There is no rebound tenderness or guarding  Neuro cranial nerves II through XII are intact and normal.  Patient has normal speech and normal gait.  There is no muscle weakness in any extremities.  Sensation is intact throughout.   Psych: Patient is alert and oriented person place and time.  Patient is very  pleasant converse with has a euthymic affect.  There are no signs of depression or anxiety.  Skin: No rash.  No petechiae or purpura.  The skin is warm and dry without diaphoresis.  There is no pallor.  Musculoskeletal: There is no tenderness to palpation in any body region.  There is no lower extremity edema and no calf tenderness.  Negative Homan's sign bilaterally.  GU: Deferred        Procedures      Patient Data     Labs Ordered/Reviewed   COVID-19, FLU A/B, RSV RAPID BY PCR - Abnormal; Notable for the following components:       Result Value    INFLUENZA VIRUS TYPE A Detected (*)     All other components within normal limits    Narrative:     Results are for the simultaneous qualitative identification of SARS-CoV-2 (formerly 2019-nCoV), Influenza A, Influenza B, and RSV RNA. These etiologic agents are generally  detectable in nasopharyngeal and nasal swabs during the ACUTE PHASE of infection. Hence, this test is intended to be performed on respiratory specimens collected from individuals with signs and symptoms of upper respiratory tract infection who meet Centers for Disease Control and Prevention (CDC) clinical and/or epidemiological criteria for Coronavirus Disease 2019 (COVID-19) testing. CDC COVID-19 criteria for testing on human specimens is available at [facility] webpage information for Healthcare Professionals: Coronavirus Disease 2019 (COVID-19) ([email or web address]).     False-negative results may occur if the virus has genomic mutations, insertions, deletions, or rearrangements or if performed very early in the course of illness. Otherwise, negative results indicate virus specific RNA targets are not detected, however negative results do not preclude SARS-CoV-2 infection/COVID-19, Influenza, or Respiratory syncytial virus infection. Results should not be used as the sole basis for patient management decisions. Negative results must be combined with clinical  observations, patient history, and epidemiological information. If upper respiratory tract infection is still suspected based on exposure history together with other clinical findings, re-testing should be considered.    Test methodology:   Cepheid Xpert Xpress SARS-CoV-2/Flu/RSV Assay real-time polymerase chain reaction (RT-PCR) test on the GeneXpert Dx and Xpert Xpress systems.   COMPREHENSIVE METABOLIC PANEL, NON-FASTING - Abnormal; Notable for the following components:    SODIUM 135 (*)     CREATININE 0.58 (*)     GLUCOSE 334 (*)     ALKALINE PHOSPHATASE 126 (*)     GLOBULIN 4.1 (*)     All other components within normal limits    Narrative:     Estimated Glomerular Filtration Rate (eGFR) is calculated using the CKD-EPI (2021) equation, intended for patients 43 years of age and older. If gender is not documented or "unknown", there will be no eGFR calculation.     LACTIC ACID LEVEL W/ REFLEX FOR LEVEL >2.0 - Abnormal; Notable for the following components:    LACTIC ACID 2.5 (*)     All other components within normal limits   MAGNESIUM  - Abnormal; Notable for the following components:    MAGNESIUM  1.8 (*)     All other components within normal limits   CBC WITH DIFF - Abnormal; Notable for the following components:    HGB 15.3 (*)     HCT 44.7 (*)     EOSINOPHIL % 0 (*)     All other components within normal limits   CBC/DIFF    Narrative:     The following orders were created for panel order CBC/DIFF.  Procedure                               Abnormality         Status                     ---------                               -----------         ------                     CBC WITH DIFF[[id number]]                Abnormal            Final result                 Please  view results for these tests on the individual orders.   THYROID  STIMULATING HORMONE (SENSITIVE TSH)   URINALYSIS, MACROSCOPIC AND MICROSCOPIC W/CULTURE REFLEX    Narrative:     The following orders were created for panel order URINALYSIS,  MACROSCOPIC AND MICROSCOPIC W/CULTURE REFLEX.  Procedure                               Abnormality         Status                     ---------                               -----------         ------                     URINALYSIS, MACROSCOPIC[[id number]]                                                     URINALYSIS, MICROSCOPIC[[id number]]                                                       Please view results for these tests on the individual orders.   URINALYSIS, MACROSCOPIC   URINALYSIS, MICROSCOPIC   EXTRA TUBES    Narrative:     The following orders were created for panel order EXTRA TUBES.  Procedure                               Abnormality         Status                     ---------                               -----------         ------                     BLUE TOP ZOXW[[id number]]                                    In process                 GOLD TOP JXBJ[[id number]]                                    In process                   Please view results for these tests on the individual orders.   BLUE TOP TUBE   GOLD TOP TUBE   LACTIC ACID - FIRST REFLEX       BMP:   135* (03/23 0051) 99 (03/23 0051) 12 (  03/23 0051)    /     334* (03/23 0051)   3.8 (03/23 0051) 24 (03/23 0051) 0.58* (03/23 0051) \              CBC:     8.6 (03/23 0052) \   15.3* (03/23 0052) /   212 (03/23 0052)      / 44.7* (03/23 0052) \             XR CHEST PA AND LATERAL   Final Result by Edi, Radresults In (03/23 0048)   NO ACUTE FINDINGS.         Radiologist location ID: [id number]                      Medical Decision Making  Initially, I was not going to give the patient any Tamiflu .  However she states she is getting worse and is afraid she may need to come to the hospital if she does not get better.  Technically speaking, she is still within the window since her influenza tests were negative on the 19th.              ED Course as of 01/04/24 0204   Paulene Boron Jan 04, 2024   0136 INFLUENZA VIRUS TYPE A(!): Detected   0136 LACTIC  ACID(!): 2.5   0136 MAGNESIUM (!): 1.8   0136 XR CHEST PA AND LATERAL   0136 CBC/DIFF(!)   0136 COMPREHENSIVE METABOLIC PANEL, NON-FASTING(!)         Medications Ordered/Administered in the ED   acetaminophen  (TYLENOL ) tablet (650 mg Oral Given 01/03/24 2316)                 Clinical Impression   Influenza A (Primary)   Type 2 diabetes mellitus (CMS HCC)   History of depression           Discharged      Current Discharge Medication List        START taking these medications    Details   acetaminophen  (TYLENOL ) 500 mg Oral Tablet Take 2 Tablets (1,000 mg total) by mouth Every 6 hours as needed for Pain  Qty: 60 Tablet, Refills: 0      Ibuprofen  (MOTRIN ) 400 mg Oral Tablet Take 1 Tablet (400 mg total) by mouth Every 6 hours as needed for Pain for up to 7 days  Qty: 30 Tablet, Refills: 0      oseltamivir  (TAMIFLU ) 75 mg Oral Capsule Take 1 Capsule (75 mg total) by mouth Twice daily for 10 days  Qty: 20 Capsule, Refills: 0      prochlorperazine  (COMPAZINE ) 10 mg Oral Tablet Take 1 Tablet (10 mg total) by mouth Every 6 hours as needed for Nausea/Vomiting  Qty: 20 Tablet, Refills: 0                     _______________________________  Larue Pock, D.O.  Emergency Medicine  [facility]          This note may have been partially generated using MModal Fluency Direct system, and there may be some incorrect words, spellings, and punctuation that were not noted in checking the note before saving, though effort was made to avoid such errors.

## 2024-01-08 ENCOUNTER — Emergency Department (HOSPITAL_BASED_OUTPATIENT_CLINIC_OR_DEPARTMENT_OTHER): Payer: MEDICAID

## 2024-01-08 ENCOUNTER — Other Ambulatory Visit: Payer: Self-pay

## 2024-01-08 ENCOUNTER — Emergency Department
Admission: EM | Admit: 2024-01-08 | Discharge: 2024-01-08 | Disposition: A | Discharge status: Home / Self Care | Payer: MEDICAID | Attending: Family | Admitting: Family

## 2024-01-08 DIAGNOSIS — W208XXA Other cause of strike by thrown, projected or falling object, initial encounter: Secondary | ICD-10-CM | POA: Insufficient documentation

## 2024-01-08 DIAGNOSIS — S90411A Abrasion, right great toe, initial encounter: Secondary | ICD-10-CM | POA: Insufficient documentation

## 2024-01-08 DIAGNOSIS — S90111A Contusion of right great toe without damage to nail, initial encounter: Secondary | ICD-10-CM | POA: Insufficient documentation

## 2024-01-08 MED ORDER — KETOROLAC 10 MG TABLET
10.0000 mg | ORAL_TABLET | Freq: Four times a day (QID) | ORAL | 0 refills | Status: DC | PRN
Start: 2024-01-08 — End: 2024-02-08

## 2024-01-08 MED ORDER — KETOROLAC 30 MG/ML (1 ML) INJECTION SOLUTION
30.0000 mg | INTRAMUSCULAR | Status: AC
Start: 2024-01-08 — End: 2024-01-08
  Administered 2024-01-08: 30 mg via INTRAMUSCULAR

## 2024-01-08 MED ORDER — KETOROLAC 30 MG/ML (1 ML) INJECTION SOLUTION
INTRAMUSCULAR | Status: AC
Start: 2024-01-08 — End: 2024-01-08
  Filled 2024-01-08: qty 1

## 2024-01-08 MED ORDER — CLINDAMYCIN HCL 150 MG CAPSULE
450.0000 mg | ORAL_CAPSULE | Freq: Three times a day (TID) | ORAL | 0 refills | Status: AC
Start: 2024-01-08 — End: 2024-01-15

## 2024-01-08 NOTE — ED Nurses Note (Signed)
 Laceration cleaned with hibi cleanse. Non-adherent dressing applied and wrapped with gauze. Patient tolerated well.

## 2024-01-08 NOTE — ED Provider Notes (Signed)
 [facility], [facility] - Emergency Department  ED Primary Provider Note  History of Present Illness   Chief Complaint   Patient presents with    Toe Injury     Dropped a can of soup on right great toe yesterday.     Arrival: The patient arrived by Car  Gail Morris is a 58 y.o. female who had concerns including Toe Injury. Pt states her sister dropped a can on rt great toe. Nail remove previously. Hx of dm and worried it will get infected. Had Tdap at pharm in past 4 years.   Review of Systems   Constitutional: No fever, chills or weakness   Skin: No rash or diaphoresis+ redness swelling abrasion   HENT: No headaches, or congestion  Eyes: No vision changes or photophobia   Cardio: No chest pain, palpitations or leg swelling   Respiratory: No cough, wheezing or SOB  GI:  No nausea, vomiting or stool changes  GU:  No dysuria, hematuria, or increased frequency  MSK: No muscle aches, +joint pain  Neuro: No seizures, LOC, numbness, tingling, or focal weakness  Psychiatric: No depression, SI or substance abuse  All other systems reviewed and are negative.    History Reviewed This Encounter: all noted and reviewed.     Physical Exam   ED Triage Vitals [01/08/24 1435]   BP (Non-Invasive) (!) 132/93   Heart Rate (!) 101   Respiratory Rate 20   Temperature 37.2 C (98.9 F)   SpO2 99 %   Weight 49.9 kg (110 lb)   Height 1.575 m (5\' 2" )       Constitutional:  58 y.o. female who appears in no distress. Normal color, no cyanosis.   HENT:   Head: Normocephalic and atraumatic.   Mouth/Throat: Oropharynx is clear and moist.   Eyes: EOMI, PERRL   Neck: Trachea midline. Neck supple.  Cardiovascular: RRR, No murmurs, rubs or gallops. Intact distal pulses.  Pulmonary/Chest: BS equal bilaterally. No respiratory distress. No wheezes, rales or chest tenderness.   Abdominal: Bowel sounds present and normal. Abdomen soft, no tenderness, no rebound and no guarding.  Back: No midline spinal tenderness, no paraspinal  tenderness, no CVA tenderness.           Musculoskeletal:+ mild rt great toe edema, tenderness  small abrasion moderate redness no deformity.  Skin: warm and dry. No rash, erythema, pallor or cyanosis  Psychiatric: normal mood and affect. Behavior is normal.   Neurological: Patient keenly alert and responsive, easily able to raise eyebrows, facial muscles/expressions symmetric, speaking in fluent sentences, moving all extremities equally and fully, normal gait    XR FOOT RIGHT   Final Result by Edi, Radresults In (03/27 1502)   NO ACUTE FRACTURE OR DISLOCATION.                Radiologist location ID: [id number]           Medical Decision Making   Diff dx of toe contusion , toe fracture abrasion. Xray neg for fx. Toe cleaned hibiclens  applied adaptic kerlex and post op shoe cap refill 2 sec mod pain relief          Medications Ordered/Administered in the ED   ketorolac  (TORADOL ) 30 mg/mL injection (30 mg IntraMUSCULAR Given 01/08/24 1453)     Clinical Impression   Contusion of right great toe without damage to nail, initial encounter (Primary)   Abrasion of right great toe, initial encounter       Disposition:  Discharged

## 2024-01-08 NOTE — ED Nurses Note (Signed)
 Patient discharged home with family.  AVS reviewed with patient/care giver.  A written copy of the AVS and discharge instructions was given to the patient/care giver. Prescriptions discussed with patient/care giver. Questions sufficiently answered as needed.  Patient/care giver encouraged to follow up with PCP as indicated.  In the event of an emergency, patient/care giver instructed to call 911 or go to the nearest emergency room.

## 2024-02-05 ENCOUNTER — Ambulatory Visit
Admission: RE | Admit: 2024-02-05 | Discharge: 2024-02-05 | Disposition: A | Discharge status: Home / Self Care | Payer: MEDICAID | Source: Ambulatory Visit | Attending: PSYCHIATRY AND NEUROLOGY-NEUROLOGY | Admitting: PSYCHIATRY AND NEUROLOGY-NEUROLOGY

## 2024-02-05 ENCOUNTER — Other Ambulatory Visit: Payer: Self-pay

## 2024-02-05 ENCOUNTER — Other Ambulatory Visit (HOSPITAL_COMMUNITY): Payer: Self-pay | Admitting: PSYCHIATRY AND NEUROLOGY-NEUROLOGY

## 2024-02-05 DIAGNOSIS — M25511 Pain in right shoulder: Secondary | ICD-10-CM

## 2024-02-08 ENCOUNTER — Other Ambulatory Visit: Payer: Self-pay

## 2024-02-08 ENCOUNTER — Encounter (HOSPITAL_BASED_OUTPATIENT_CLINIC_OR_DEPARTMENT_OTHER): Payer: Self-pay

## 2024-02-08 ENCOUNTER — Emergency Department (HOSPITAL_BASED_OUTPATIENT_CLINIC_OR_DEPARTMENT_OTHER): Payer: MEDICAID

## 2024-02-08 ENCOUNTER — Emergency Department
Admission: EM | Admit: 2024-02-08 | Discharge: 2024-02-08 | Disposition: A | Discharge status: Home / Self Care | Payer: MEDICAID | Source: Home / Self Care | Attending: FAMILY PRACTICE | Admitting: FAMILY PRACTICE

## 2024-02-08 DIAGNOSIS — S161XXA Strain of muscle, fascia and tendon at neck level, initial encounter: Secondary | ICD-10-CM | POA: Insufficient documentation

## 2024-02-08 DIAGNOSIS — G44209 Tension-type headache, unspecified, not intractable: Secondary | ICD-10-CM | POA: Insufficient documentation

## 2024-02-08 MED ORDER — IBUPROFEN 600 MG TABLET
600.0000 mg | ORAL_TABLET | Freq: Four times a day (QID) | ORAL | 0 refills | Status: AC | PRN
Start: 2024-02-08 — End: 2024-02-11

## 2024-02-08 NOTE — ED Provider Notes (Signed)
 [facility], [place] - Emergency Department  ED Primary Provider Note  History of Present Illness   Chief Complaint   Patient presents with    Sore Throat     States left sided neck pain radiating to back of head with pain to left ear and sore throat x3 days. States hx hole in left eardrum. Recently got over flu.      Gail Morris is a 58 y.o. female who had concerns including Sore Throat.  Arrival: The patient arrived by Car      Sore Throat      History Reviewed This Encounter:  Patient's past medical, surgical, social history reviewed noted.    Physical Exam   ED Triage Vitals [02/08/24 1301]   BP (Non-Invasive) 129/81   Heart Rate 77   Respiratory Rate 16   Temperature 36.8 C (98.3 F)   SpO2 99 %   Weight 49.9 kg (110 lb)   Height 1.575 m (5\' 2" )     Physical Exam  Head: Normocephalic atraumatic.  Does have splenius tenderness on the left.    Ears: TMs clear without fluid or retraction   Eyes: Sclera is white, EOMI.    Ears: TMs clear without fluid or retraction   Nasal: Mildly injected, patent bilaterally, thick yellow mucus.    Oral: Pink and moist, edentulous   Pharynx: Injected without PND exudate or petechiae   Neck: Supple without adenopathy, trachea is in midline.  Paracervical musculature has fullness he tenderness in the upper cervical spine, C6-C7 tenderness and fullness noted.  Patient also has clavicular head of the sternocleidomastoid tenderness and fullness.  Range of motion of the neck has pain with left rotation, extension, and right side bending.    Chest:  Symmetrical excursions with respiration.  She has supraspinatus, trapezius area tenderness and fullness on the left as well.  Lungs: Clear symmetrical bilaterally, distant.    Heart: Regular rate rhythm without murmur   Abdomen: Soft normal bowel sounds nontender   Upper Extremities:  She does have pain across the top of the shoulder area with abduction of the left arm.  Right arm does have min limited abduction,  extension rotation, old rotator cuff tear.    Lower extremities:  Moving symmetrically   Skin: No suspicious rashes or lesions   Neurological: No gross focal motor sensory deficits.      Patient Data   Labs Ordered/Reviewed - No data to display  No orders to display     Medical Decision Making        Medical Decision Making  Moderate complexity straightforward differential includes migraine, cervical strain and tension headache.  This appears to be muscular tension headache with cervical strain.  She will be treated with the Motrin  600 t.i.d. for 3 days, ice to the areas that are bothersome, range of motion for neck flexibility and follow up primary care.    Amount and/or Complexity of Data Reviewed  Radiology: ordered.                   Clinical Impression   Acute non intractable tension-type headache (Primary)   Strain of neck muscle, initial encounter       Disposition: Discharged

## 2024-02-08 NOTE — Discharge Instructions (Signed)
 You have a cervical strain and tension headache, your for exercises for you to do 3 to 4 times a day for stretches and range of motion to help improve the muscles and reduce the stress and tension.  Your prescription for Motrin  600 mg to take 3 times a day with food for the next 3 days, which will be breakfast, lunch, and dinner.    Call follow-up with the primary care provider, return to ER symptoms change or worsen.

## 2024-02-10 NOTE — ED Notes (Signed)
 [facility], [facility] - Emergency Department  Peer Recovery Coach Assessment    Initial Evaluation  Referred by:: Nurse  Location of Evaluation: Emergency Department  How many times in the last 12 months have you been to the ED?: 6 or more  Have you ever served or are you currently serving in the Armed Forces?: No             Substance Use History  Patient current substance use status: Patient stated she smokes marijuana and does so for pain management. Safe use and long-term affects were discussed.    Prior treatment history?: No    Currently enrolled in substance use program?: No    Within the last 30 days, what substances has the patient used?: Marijuana  Patient's age at first substance use?: 15-20  Drug route of administration: Smoked    Has the patient ever had sustained abstinence?: No              Family, Social, Home & Safety History  Marital Status: Divorced            Need to improve relationships with family?: No    Social network: Immediate family, Substance using peers, Non-substance using peers/friends/other    Current living situation: Independent  Any help needed with the following?: None  Contact phone number for the patient: [phone]       Has the patient had any legal issues within the past 30 days?: None         Employment  Current employment status: Disabled    Diplomatic Services operational officer?: No  Needs assistance with job search?: No    Engagement  Readiness ruler: 1  Summary of assessment priority areas: Other    Brief Intervention  Discussed plan to reduce/quit substance use?: Yes  Discussed willingness to enter treatment?: Yes  Indicated patient's stage of change:: 1 - Precontemplation    Patient seen by Peer Recovery Coach and is a candidate for buprenorphine  administration in the ED. Patient needs assessment for bup treatment.: No (non opioiduser)    Plan  Was the patient referred to treatment?: No    Was patient referred to physician for Buprenorphine  Assessment in the  ED?: No (non opioid user)    Did patient receive Narcan in the ED?: No         Follow-up  Patient admitted for treatment?: No        Need for additional follow-up?: No       Timm Foot, Peer Recovery Coach 02/10/2024 12:04

## 2024-03-04 ENCOUNTER — Emergency Department (HOSPITAL_COMMUNITY): Payer: MEDICAID

## 2024-03-04 ENCOUNTER — Emergency Department
Admission: EM | Admit: 2024-03-04 | Discharge: 2024-03-05 | Disposition: A | Discharge status: Home / Self Care | Payer: MEDICAID | Attending: Emergency Medicine | Admitting: Emergency Medicine

## 2024-03-04 ENCOUNTER — Other Ambulatory Visit: Payer: Self-pay

## 2024-03-04 DIAGNOSIS — E1165 Type 2 diabetes mellitus with hyperglycemia: Secondary | ICD-10-CM | POA: Insufficient documentation

## 2024-03-04 DIAGNOSIS — M47816 Spondylosis without myelopathy or radiculopathy, lumbar region: Secondary | ICD-10-CM

## 2024-03-04 DIAGNOSIS — Z79899 Other long term (current) drug therapy: Secondary | ICD-10-CM | POA: Insufficient documentation

## 2024-03-04 DIAGNOSIS — Z794 Long term (current) use of insulin: Secondary | ICD-10-CM | POA: Insufficient documentation

## 2024-03-04 DIAGNOSIS — M545 Low back pain, unspecified: Secondary | ICD-10-CM | POA: Insufficient documentation

## 2024-03-04 LAB — URINALYSIS, MACROSCOPIC
BILIRUBIN: NEGATIVE mg/dL
BLOOD: NEGATIVE mg/dL
GLUCOSE: >1000 mg/dL — AB
KETONES: NEGATIVE mg/dL
LEUKOCYTES: NEGATIVE WBCs/uL
NITRITE: NEGATIVE
PH: 6.5 (ref 5.0–9.0)
PROTEIN: NEGATIVE mg/dL
SPECIFIC GRAVITY: 1.042 — ABNORMAL HIGH (ref 1.002–1.030)
UROBILINOGEN: NORMAL mg/dL

## 2024-03-04 LAB — COMPREHENSIVE METABOLIC PANEL, NON-FASTING
ALBUMIN/GLOBULIN RATIO: 1.2 (ref 0.8–1.4)
ALBUMIN: 4.6 g/dL (ref 3.5–5.7)
ALKALINE PHOSPHATASE: 161 U/L — ABNORMAL HIGH (ref 34–104)
ALT (SGPT): 20 U/L (ref 7–52)
ANION GAP: 13 mmol/L (ref 4–13)
AST (SGOT): 35 U/L (ref 13–39)
BILIRUBIN TOTAL: 0.6 mg/dL (ref 0.3–1.0)
BUN/CREA RATIO: 18 (ref 6–22)
BUN: 7 mg/dL (ref 7–25)
CALCIUM, CORRECTED: 9.3 mg/dL (ref 8.9–10.8)
CALCIUM: 9.8 mg/dL (ref 8.6–10.3)
CHLORIDE: 97 mmol/L — ABNORMAL LOW (ref 98–107)
CO2 TOTAL: 20 mmol/L — ABNORMAL LOW (ref 21–31)
CREATININE: 0.39 mg/dL — ABNORMAL LOW (ref 0.60–1.30)
ESTIMATED GFR: 117 mL/min/{1.73_m2} (ref >59)
GLOBULIN: 3.7 — ABNORMAL HIGH (ref 2.0–3.5)
GLUCOSE: 350 mg/dL — ABNORMAL HIGH (ref 74–109)
OSMOLALITY, CALCULATED: 273 mosm/kg (ref 270–290)
POTASSIUM: 3.9 mmol/L (ref 3.5–5.1)
PROTEIN TOTAL: 8.3 g/dL (ref 6.4–8.9)
SODIUM: 130 mmol/L — ABNORMAL LOW (ref 136–145)

## 2024-03-04 LAB — CBC WITH DIFF
BASOPHIL #: 0.1 10*3/uL (ref 0.00–0.10)
BASOPHIL %: 1 % (ref 0–1)
EOSINOPHIL #: 0.2 10*3/uL (ref 0.00–0.50)
EOSINOPHIL %: 2 % (ref 1–7)
HCT: 46.3 % — ABNORMAL HIGH (ref 31.2–41.9)
HGB: 16.1 g/dL — ABNORMAL HIGH (ref 10.9–14.3)
LYMPHOCYTE #: 3.2 10*3/uL — ABNORMAL HIGH (ref 1.10–3.10)
LYMPHOCYTE %: 34 % (ref 16–46)
MCH: 32.2 pg (ref 24.7–32.8)
MCHC: 34.7 g/dL (ref 32.3–35.6)
MCV: 92.6 fL (ref 75.5–95.3)
MONOCYTE #: 0.7 10*3/uL (ref 0.20–0.90)
MONOCYTE %: 7 % (ref 4–11)
MPV: 8.4 fL (ref 7.9–10.8)
NEUTROPHIL #: 5.1 10*3/uL (ref 1.90–8.20)
NEUTROPHIL %: 55 % (ref 43–77)
PLATELETS: 251 10*3/uL (ref 140–440)
RBC: 5 10*6/uL — ABNORMAL HIGH (ref 3.63–4.92)
RDW: 13.8 % (ref 12.3–17.7)
WBC: 9.3 10*3/uL (ref 3.8–11.8)

## 2024-03-04 LAB — URINALYSIS, MICROSCOPIC
BACTERIA: NEGATIVE /HPF
RBCS: 6 /HPF — ABNORMAL HIGH (ref <4)
SQUAMOUS EPITHELIAL: 6 /HPF (ref <28)
WBCS: 2 /HPF (ref <6)

## 2024-03-04 NOTE — ED Triage Notes (Signed)
 Lower back pain x two days, denies known injury, pain increased with movement

## 2024-03-05 ENCOUNTER — Encounter (HOSPITAL_COMMUNITY): Payer: Self-pay

## 2024-03-05 LAB — POC BLOOD GLUCOSE (RESULTS): GLUCOSE, POC: 273 mg/dL — ABNORMAL HIGH (ref 70–100)

## 2024-03-05 MED ORDER — NAPROXEN 500 MG TABLET
500.0000 mg | ORAL_TABLET | Freq: Two times a day (BID) | ORAL | 0 refills | Status: DC
Start: 2024-03-05 — End: 2024-04-03

## 2024-03-05 MED ORDER — KETOROLAC 60 MG/2 ML INTRAMUSCULAR SOLUTION
INTRAMUSCULAR | Status: AC
Start: 2024-03-05 — End: 2024-03-05
  Filled 2024-03-05: qty 2

## 2024-03-05 MED ORDER — METHOCARBAMOL 100 MG/ML INJECTION SOLUTION
1.0000 g | INTRAMUSCULAR | Status: AC
Start: 2024-03-05 — End: 2024-03-05
  Administered 2024-03-05: 1000 mg via INTRAMUSCULAR
  Filled 2024-03-05 (×2): qty 10

## 2024-03-05 MED ORDER — KETOROLAC 60 MG/2 ML INTRAMUSCULAR SOLUTION
60.0000 mg | INTRAMUSCULAR | Status: AC
Start: 2024-03-05 — End: 2024-03-05
  Administered 2024-03-05: 60 mg via INTRAMUSCULAR

## 2024-03-05 MED ORDER — METHOCARBAMOL 500 MG TABLET
500.0000 mg | ORAL_TABLET | Freq: Four times a day (QID) | ORAL | 0 refills | Status: DC | PRN
Start: 2024-03-05 — End: 2024-04-03

## 2024-03-05 NOTE — ED Notes (Signed)
 [facility] - Emergency Department  Peer Recovery Coach Assessment    Initial Evaluation  Referred by:: Nurse  Location of Evaluation: Emergency Department  How many times in the last 12 months have you been to the ED?: 6 or more  Have you ever served or are you currently serving in the Armed Forces?: No             Substance Use History  Patient current substance use status: Missed patient 5/22 @9 :26pm.  Patient stated on Audit C that she smokes marijuana for pain management.  PRSS attempted to reach patient via phone to share harm reduction but unable to reach.    Prior treatment history?: No    Currently enrolled in substance use program?: No    Within the last 30 days, what substances has the patient used?: Marijuana  Drug route of administration: Smoked                   Family, Social, Home & Safety History  Marital Status: Divorced                      Current living situation: Independent  Any help needed with the following?: None  Contact phone number for the patient: [phone]       Has the patient had any legal issues within the past 30 days?: None    Additional comments?: Missed patient 5/22 @9 :26pm. Patient stated on Audit C that she smokes marijuana for pain management. PRSS attempted to reach patient via phone to share harm reduction but unable to reach.    Employment  Current employment status: Disabled    Diplomatic Services operational officer?: No  Needs assistance with job search?: No    Engagement  Readiness ruler: 1  Summary of assessment priority areas: Other  Summary of assessment priority areas: comments: Missed patient 5/22 @9 :26pm. Patient stated on Audit C that she smokes marijuana for pain management. PRSS attempted to reach patient via phone to share harm reduction but unable to reach.    Brief Intervention  Discussed plan to reduce/quit substance use?: No  Discussed willingness to enter treatment?: No  Indicated patient's stage of change:: 1 - Precontemplation    Patient seen  by Peer Recovery Coach and is a candidate for buprenorphine  administration in the ED. Patient needs assessment for bup treatment.: No    Plan  Was the patient referred to treatment?: No    Was patient referred to physician for Buprenorphine  Assessment in the ED?: No    Did patient receive Narcan in the ED?: No    Plan: Additional Comments: Missed patient 5/22 @9 :26pm. Patient stated on Audit C that she smokes marijuana for pain management. PRSS attempted to reach patient via phone to share harm reduction but unable to reach.    Follow-up           Need for additional follow-up?: No  Additional comments: Missed patient 5/22 @9 :26pm. Patient stated on Audit C that she smokes marijuana for pain management. PRSS attempted to reach patient via phone to share harm reduction but unable to reach.    Lindy Rhyme, Peer Recovery Coach 03/05/2024 12:15

## 2024-03-05 NOTE — ED Nurses Note (Signed)
 Pt d/c to home after medications have been given. Pt is aaox3. No c/o voiced at this time. Pleasant demeanor. D/C paperwork along w/ prescriptions given to pt. Ambulatory, gait steady although slow. Education has been given.

## 2024-03-05 NOTE — ED Provider Notes (Signed)
 [facility]  ED Primary Provider Note        Arrival: The patient arrived by Car     History of Present Illness   chief complaint  Gail Morris is a 58 y.o. female who had concerns including Back Pain.  Patient 58 year old female that presents range room with a chief complaint of right low back pain for 2-3 days.  States the pain is sharp in nature.  Worse with movement and palpation.  Patient states she may have "injured it" because "I am always on the go".  Pain is sharp, it is reproducible with palpation right lower lumbar region.  Also exacerbated with movement.  Patient states she does have chronic low back pain and prior back surgery approximately 4 years ago.  Patient currently on Neurontin.  Patient does follow up with Dr. Onesimo Bijou.  Blood pressure 175/94 with a heart rate of 100, respiratory rate of 20, oxygen saturation 98% on room air.  Afebrile at 97.3.  All nursing notes reviewed        Review of Systems     No other overt Review of Systems are noted to be positive except noted in the HPI.      Historical Data   History Reviewed This Encounter: Medical History  Surgical History  Family History  Social History      Physical Exam   ED Triage Vitals [03/04/24 2123]   BP (Non-Invasive) (!) 175/94   Heart Rate 100   Respiratory Rate 20   Temperature 36.3 C (97.3 F)   SpO2 98 %   Weight 51.7 kg (114 lb)   Height 1.575 m (5\' 2" )         Exam:   Constitutional:  Patient alert orient x3 in no apparent distress.  No limitations.  Head: Atraumatic normocephalic  Eyes :  Pupils are equal round reactive to light and accommodation extraocular muscles are intact.  Sclera and conjunctiva are unremarkable  Ears:  Tympanic membranes are pearly gray bilaterally; external auditory canals are unremarkable; external ears without any lesions  Nose:  Nares are patent turbinates are pink and moist  Mouth:  Mucosa is pink and moist without lesions.  Posterior pharynx is pink and moist  without hypertrophy/exudate.  Poor dentition  Neck:  Soft and supple without palpable lymphadenopathy.  Heart:  Regular rate and rhythm without audible murmur  Lungs:  Clear to auscultation bilaterally without any wheezing/rales/rhonchi  Abdomen:  Soft nontender without any rebound or guarding; positive bowel sounds throughout  Genitalia:  Deferred  Back: Patient tender to palpation right lower lumbar region.  No appreciable step-off deformity.  No SI tenderness.  Is SLR negative.  Skin:  Warm and dry without lesions.  Normal skin turgor.  Brisk capillary refill distally  Extremities:  Good strength bilaterally with full range of motion of upper and lower extremities.  Neuro:  Alert oriented x3.  Cranial nerves II-XII grossly intact as tested.  Excellent sensation distally over all dermatomes.  Psychiatric:  Patient cooperative, affect appropriate, insight and judgment good          Procedures           Patient Data     Labs Ordered/Reviewed   URINALYSIS, MACROSCOPIC - Abnormal; Notable for the following components:       Result Value    SPECIFIC GRAVITY 1.042 (*)     GLUCOSE >1000 (*)     All other components within normal limits   URINALYSIS,  MICROSCOPIC - Abnormal; Notable for the following components:    RBCS 6 (*)     All other components within normal limits   COMPREHENSIVE METABOLIC PANEL, NON-FASTING - Abnormal; Notable for the following components:    SODIUM 130 (*)     CHLORIDE 97 (*)     CO2 TOTAL 20 (*)     CREATININE 0.39 (*)     GLUCOSE 350 (*)     ALKALINE PHOSPHATASE 161 (*)     GLOBULIN 3.7 (*)     All other components within normal limits    Narrative:     Estimated Glomerular Filtration Rate (eGFR) is calculated using the CKD-EPI (2021) equation, intended for patients 61 years of age and older. If gender is not documented or "unknown", there will be no eGFR calculation.     CBC WITH DIFF - Abnormal; Notable for the following components:    RBC 5.00 (*)     HGB 16.1 (*)     HCT 46.3 (*)      LYMPHOCYTE # 3.20 (*)     All other components within normal limits   POC BLOOD GLUCOSE (RESULTS) - Abnormal; Notable for the following components:    GLUCOSE, POC 273 (*)     All other components within normal limits   URINALYSIS, MACROSCOPIC AND MICROSCOPIC W/CULTURE REFLEX    Narrative:     The following orders were created for panel order URINALYSIS, MACROSCOPIC AND MICROSCOPIC W/CULTURE REFLEX.  Procedure                               Abnormality         Status                     ---------                               -----------         ------                     URINALYSIS, MACROSCOPIC[[id number]]      Abnormal            Final result               URINALYSIS, MICROSCOPIC[[id number]]      Abnormal            Final result                 Please view results for these tests on the individual orders.   CBC/DIFF    Narrative:     The following orders were created for panel order CBC/DIFF.  Procedure                               Abnormality         Status                     ---------                               -----------         ------  CBC WITH HQIO[[id number]]                Abnormal            Final result                 Please view results for these tests on the individual orders.   PERFORM POC WHOLE BLOOD GLUCOSE       XR LUMBOSACRAL SPINE   Final Result by Edi, Radresults In (05/22 2316)   No acute osseous abnormality.      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                     Radiologist location ID: [facility]             Medical Decision Making          Medical Decision Making  Five view of the lumbar spine shows no acute osseous abnormality.  With a levoconvex curvature moderate spondylosis.  She radiology report.  Urinalysis does shows spec gravity of 1.042 with a glucosuria of greater than 1000.  Six red blood cells per high-power field however only 2 white blood cells per high-power field.  CBC unremarkable.  Sodium 130 with a chloride of 97, bicarb 20,  BUN is 7 with a creatinine of 0.39.  Repeat fingerstick 273 without intervention.  Patient does admit is not taken her diabetic medications today.  Patient be discharged home with a prescription for Robaxin as well as naproxen .  Patient advised to follow up with the family doctor for recheck on Tuesday/Wednesday.  Back rest over the weekend.  See discharge instructions for detailed.  Patient to take her regular diabetic medications when she gets home    Amount and/or Complexity of Data Reviewed  Labs: ordered. Decision-making details documented in ED Course.  Radiology: ordered and independent interpretation performed. Decision-making details documented in ED Course.    Risk  Prescription drug management.    Critical Care  Total time providing critical care: 0 minutes        ED Course as of 03/05/24 0233   Thu Mar 04, 2024   2212 Urinalysis shows specific gravity 1.042 with a glucosuria of greater than 1000.  Six red blood cells per high-power field.  Only 2 white blood cells per high-power field   2331 Five view lumbar spine:   FINDINGS:  5 nonrib-bearing lumbar type vertebral segments.  Osseous demineralization. Vertebral body heights are preserved.  Levoconvex lumbar curvature. No significant listhesis.  Mild to moderate lumbar spondylosis, most advanced at L4-5 and L5-S1.  Lower lumbar facet arthrosis. Mild degeneration of the SI joints without diastases.   Arterial vascular calcifications.     IMPRESSION:  No acute osseous abnormality.     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     2332 CBC unremarkable   2343 Sodium 130 with a chloride of 97, bicarb 20, BUN is 7 with a creatinine of 0.39.  Glucose quite elevated at 350 with an alkaline phosphatase of 161.   Fri Mar 05, 2024   0117 Patient ordered Toradol  60 mg IM along with a Robaxin   0118 Blood glucose fingerstick ordered   0159 Repeat glucose 273         Medications Ordered/Administered in the ED   ketorolac  (TORADOL ) 60mg /2 mL IM injection  (60 mg IntraMUSCULAR Given 03/05/24 0119)   methocarbamol (ROBAXIN) 100 mg/mL injection (1,000 mg IntraMUSCULAR Given 03/05/24 0219)  Following the history, physical exam, and ED workup, the patient was deemed stable and suitable for discharge. The patient/caregiver was advised to return to the ED for any new or worsening symptoms. Discharge medications, and follow-up instructions were discussed with the patient/caregiver in detail, who verbalizes understanding. The patient/caregiver is in agreement and is comfortable with the plan of care.    Disposition: Discharged         Current Discharge Medication List        START taking these medications.        Details   methocarbamoL 500 mg Tablet  Commonly known as: ROBAXIN   500 mg, Oral, 4 TIMES DAILY PRN  Qty: 20 Tablet  Refills: 0     naproxen  500 mg Tablet  Commonly known as: NAPROSYN    500 mg, Oral, 2 TIMES DAILY WITH FOOD  Qty: 20 Tablet  Refills: 0            CONTINUE these medications - NO CHANGES were made during your visit.        Details   Accu-Chek Guide Me Glucose Mtr Misc  Generic drug: Blood-Glucose Meter   4 TIMES DAILY  Refills: 0     Accu-Chek Guide test strips Strip  Generic drug: Blood Sugar Diagnostic   2 TIMES DAILY  Refills: 0     Accu-Chek Softclix Lancets Misc  Generic drug: Lancets   2 TIMES DAILY  Refills: 0     acetaminophen  500 mg Tablet  Commonly known as: TYLENOL    1,000 mg, Oral, EVERY 6 HOURS PRN  Qty: 60 Tablet  Refills: 0     BD UF Mini Pen Needle 31 gauge x 3/16" Needle  Generic drug: Pen Needle (Disposable)   SMARTSIG:SUB-Q  Refills: 0     empagliflozin 25 mg Tablet  Commonly known as: JARDIANCE   25 mg, Daily  Refills: 0     ergocalciferol (vitamin D2) 1,250 mcg (50,000 unit) Capsule  Commonly known as: DRISDOL   50,000 Units, EVERY 7 DAYS  Refills: 0     fluticasone propionate 50 mcg/actuation Spray, Suspension  Commonly known as: FLONASE   1 Spray, DAILY PRN  Refills: 0     FreeStyle Libre 14 Day Reader Misc  Generic drug: flash  glucose scanning reader   USE TO CHECK GLUCOSE AS DIRECTED 4 TIMES DAILY  Refills: 0     * gabapentin 800 mg Tablet  Commonly known as: NEURONTIN   800 mg, 3 TIMES DAILY  Refills: 0     * gabapentin 100 mg Capsule  Commonly known as: NEURONTIN   100 mg, 3 TIMES DAILY  Refills: 0     hydrOXYzine HCL 50 mg Tablet  Commonly known as: ATARAX   50 mg, 4 TIMES DAILY PRN  Refills: 0     Insulin  Syringe-Needle U-100 0.3 mL 29 gauge x 1/2" Syringe   Daily  Refills: 0     Nurtec ODT 75 mg Tablet, Rapid Dissolve  Generic drug: rimegepant   75 mg, EVERY 24 HOURS PRN  Refills: 0     ondansetron  4 mg Tablet, Rapid Dissolve  Commonly known as: ZOFRAN  ODT   4 mg, 4 TIMES DAILY PRN  Refills: 0     ramipriL 10 mg Capsule  Commonly known as: ALTACE   10 mg, Daily  Refills: 0     simvastatin 10 mg Tablet  Commonly known as: ZOCOR   SMARTSIG:1 Tablet(s) By Mouth Every Evening  Refills: 0  Toujeo  Max U-300 SoloStar 300 unit/mL (3 mL) Insulin  Pen  Generic drug: insulin  glargine U-300 conc   55 Units, Daily  Refills: 0     Ventolin  HFA 90 mcg/actuation oral inhaler  Generic drug: albuterol  sulfate   2 Puffs, EVERY 4 HOURS PRN  Refills: 0           * This list has 2 medication(s) that are the same as other medications prescribed for you. Read the directions carefully, and ask your doctor or other care provider to review them with you.                ASK your doctor about these medications.        Details   aspirin  81 mg Tablet, Delayed Release (E.C.)  Commonly known as: ECOTRIN  Ask about: Which instructions should I use?   81 mg, Daily  Refills: 0     Ibuprofen  600 mg Tablet  Commonly known as: MOTRIN   Ask about: Should I take this medication?   600 mg, Oral, 4 TIMES DAILY PRN  Qty: 12 Tablet  Refills: 0            Follow up:   Peters, Jana, DO  [address]  [place] [address]  [phone]      Tuesday/Wednesday                 Clinical Impression   Low back pain (Primary)   Uncontrolled type 2 diabetes mellitus with  hyperglycemia (CMS HCC)         Current Discharge Medication List        START taking these medications    Details   methocarbamoL (ROBAXIN) 500 mg Oral Tablet Take 1 Tablet (500 mg total) by mouth Four times a day as needed for Other (Muscle spasms) for up to 5 days  Qty: 20 Tablet, Refills: 0      naproxen  (NAPROSYN ) 500 mg Oral Tablet Take 1 Tablet (500 mg total) by mouth Twice daily with food  Qty: 20 Tablet, Refills: 0             R.A. Neev Mcmains, DO  Department of Emergency Medicine

## 2024-03-05 NOTE — Discharge Instructions (Signed)
 Take naproxen  as needed for discomfort  Take Robaxin as needed for muscle spasms  Take your diabetic medications when you get home  Closely monitor your blood sugar  Follow up with the primary care provider for recheck on Tuesday/Wednesday  Return to emergency room for any increased pain, numbness, tingling, or any concerns

## 2024-03-31 ENCOUNTER — Observation Stay
Admission: EM | Admit: 2024-03-31 | Discharge: 2024-04-03 | Disposition: A | Discharge status: Home / Self Care | Payer: MEDICAID | Source: Home / Self Care

## 2024-03-31 ENCOUNTER — Other Ambulatory Visit: Payer: Self-pay

## 2024-03-31 ENCOUNTER — Emergency Department (HOSPITAL_BASED_OUTPATIENT_CLINIC_OR_DEPARTMENT_OTHER): Payer: MEDICAID

## 2024-03-31 ENCOUNTER — Observation Stay (HOSPITAL_COMMUNITY): Payer: MEDICAID

## 2024-03-31 ENCOUNTER — Encounter (HOSPITAL_BASED_OUTPATIENT_CLINIC_OR_DEPARTMENT_OTHER): Payer: Self-pay

## 2024-03-31 DIAGNOSIS — E876 Hypokalemia: Secondary | ICD-10-CM

## 2024-03-31 DIAGNOSIS — K2101 Gastro-esophageal reflux disease with esophagitis, with bleeding: Secondary | ICD-10-CM | POA: Insufficient documentation

## 2024-03-31 DIAGNOSIS — R112 Nausea with vomiting, unspecified: Secondary | ICD-10-CM

## 2024-03-31 DIAGNOSIS — E860 Dehydration: Secondary | ICD-10-CM | POA: Insufficient documentation

## 2024-03-31 DIAGNOSIS — I10 Essential (primary) hypertension: Secondary | ICD-10-CM | POA: Insufficient documentation

## 2024-03-31 DIAGNOSIS — Z79899 Other long term (current) drug therapy: Secondary | ICD-10-CM | POA: Insufficient documentation

## 2024-03-31 DIAGNOSIS — Z853 Personal history of malignant neoplasm of breast: Secondary | ICD-10-CM

## 2024-03-31 DIAGNOSIS — R1010 Upper abdominal pain, unspecified: Secondary | ICD-10-CM

## 2024-03-31 DIAGNOSIS — K449 Diaphragmatic hernia without obstruction or gangrene: Secondary | ICD-10-CM | POA: Insufficient documentation

## 2024-03-31 DIAGNOSIS — E1110 Type 2 diabetes mellitus with ketoacidosis without coma: Principal | ICD-10-CM | POA: Insufficient documentation

## 2024-03-31 DIAGNOSIS — Z08 Encounter for follow-up examination after completed treatment for malignant neoplasm: Secondary | ICD-10-CM

## 2024-03-31 DIAGNOSIS — F17210 Nicotine dependence, cigarettes, uncomplicated: Secondary | ICD-10-CM

## 2024-03-31 DIAGNOSIS — K2950 Unspecified chronic gastritis without bleeding: Secondary | ICD-10-CM | POA: Insufficient documentation

## 2024-03-31 DIAGNOSIS — K0889 Other specified disorders of teeth and supporting structures: Secondary | ICD-10-CM | POA: Insufficient documentation

## 2024-03-31 DIAGNOSIS — R1310 Dysphagia, unspecified: Secondary | ICD-10-CM | POA: Insufficient documentation

## 2024-03-31 LAB — CBC WITH DIFF
BASOPHIL #: 0.04 10*3/uL (ref 0.00–0.10)
BASOPHIL %: 0 % (ref 0–1)
EOSINOPHIL #: 0.05 10*3/uL (ref 0.00–0.50)
EOSINOPHIL %: 0 % — ABNORMAL LOW (ref 1–7)
HCT: 44.7 % — ABNORMAL HIGH (ref 31.2–41.9)
HGB: 15.6 g/dL — ABNORMAL HIGH (ref 10.9–14.3)
LYMPHOCYTE #: 1.34 10*3/uL (ref 1.10–3.10)
LYMPHOCYTE %: 14 % — ABNORMAL LOW (ref 16–46)
MCH: 32.7 pg (ref 24.7–32.8)
MCHC: 34.9 g/dL (ref 32.3–35.6)
MCV: 94 fL (ref 75.5–95.3)
MONOCYTE #: 0.45 10*3/uL (ref 0.20–0.90)
MONOCYTE %: 5 % (ref 4–11)
MPV: 8.1 fL (ref 7.9–10.8)
NEUTROPHIL #: 7.79 10*3/uL (ref 1.90–8.20)
NEUTROPHIL %: 81 % — ABNORMAL HIGH (ref 43–77)
PLATELETS: 227 10*3/uL (ref 140–440)
RBC: 4.76 10*6/uL (ref 3.63–4.92)
RDW: 15.1 % (ref 12.3–17.7)
WBC: 9.7 10*3/uL (ref 3.8–11.8)

## 2024-03-31 LAB — COMPREHENSIVE METABOLIC PANEL, NON-FASTING
ALBUMIN/GLOBULIN RATIO: 0.8 (ref 0.8–1.4)
ALBUMIN: 3.7 g/dL (ref 3.4–5.0)
ALKALINE PHOSPHATASE: 128 U/L — ABNORMAL HIGH (ref 46–116)
ALT (SGPT): 23 U/L (ref <=78)
ANION GAP: 16 mmol/L — ABNORMAL HIGH (ref 4–13)
AST (SGOT): 24 U/L (ref 15–37)
BILIRUBIN TOTAL: 0.7 mg/dL (ref 0.2–1.0)
BUN/CREA RATIO: 25
BUN: 20 mg/dL — ABNORMAL HIGH (ref 7–18)
CALCIUM, CORRECTED: 9.4 mg/dL
CALCIUM: 9.2 mg/dL (ref 8.5–10.1)
CHLORIDE: 101 mmol/L (ref 98–107)
CO2 TOTAL: 18 mmol/L — ABNORMAL LOW (ref 21–32)
CREATININE: 0.79 mg/dL (ref 0.55–1.02)
ESTIMATED GFR: 88 mL/min/{1.73_m2} (ref >59)
GLOBULIN: 4.6
GLUCOSE: 268 mg/dL — ABNORMAL HIGH (ref 74–106)
OSMOLALITY, CALCULATED: 282 mosm/kg (ref 270–290)
POTASSIUM: 4.3 mmol/L (ref 3.5–5.1)
PROTEIN TOTAL: 8.3 g/dL — ABNORMAL HIGH (ref 6.4–8.2)
SODIUM: 135 mmol/L — ABNORMAL LOW (ref 136–145)

## 2024-03-31 LAB — POC BLOOD GLUCOSE (RESULTS)
GLUCOSE, POC: 218 mg/dL — ABNORMAL HIGH (ref 70–100)
GLUCOSE, POC: 198 mg/dL — ABNORMAL HIGH (ref 70–100)
GLUCOSE, POC: 184 mg/dL — ABNORMAL HIGH (ref 70–100)
GLUCOSE, POC: 153 mg/dL — ABNORMAL HIGH (ref 70–100)

## 2024-03-31 LAB — URINALYSIS, MACRO/MICRO
BILIRUBIN: NEGATIVE mg/dL
BLOOD: NEGATIVE mg/dL
GLUCOSE: >=1000 mg/dL — AB
KETONES: >=80 mg/dL — AB
LEUKOCYTES: NEGATIVE WBCs/uL
NITRITE: NEGATIVE
PH: 6 (ref 4.6–8.0)
PROTEIN: NEGATIVE mg/dL
SPECIFIC GRAVITY: 1.02 (ref 1.003–1.035)
UROBILINOGEN: 0.2 mg/dL (ref 0.2–1.0)

## 2024-03-31 LAB — ACETONE

## 2024-03-31 LAB — TROPONIN-I: TROPONIN I: 5 ng/L (ref <15)

## 2024-03-31 LAB — BLOOD GAS W/ LACTATE REFLEX
%FIO2 (VENOUS): 21 %
BASE DEFICIT: 9.6 mmol/L — ABNORMAL HIGH (ref 0.0–3.0)
BICARBONATE (VENOUS): 16.7 mmol/L — ABNORMAL LOW (ref 22.0–29.0)
LACTATE: 2.4 mmol/L — ABNORMAL HIGH (ref <=1.9)
O2 SATURATION (VENOUS): 69.2 % (ref 40.0–85.0)
PCO2 (VENOUS): 33 mmHg — ABNORMAL LOW (ref 41–51)
PH (VENOUS): 7.29 — ABNORMAL LOW (ref 7.32–7.43)
PO2 (VENOUS): 41 mmHg (ref 35–50)

## 2024-03-31 LAB — LIPASE: LIPASE: 22 U/L (ref 16–77)

## 2024-03-31 LAB — OCCULT BLOOD (PRN ED USE ONLY): OCCULT BLOOD: NEGATIVE

## 2024-03-31 LAB — LACTIC ACID - FIRST REFLEX: LACTIC ACID: 1.6 mmol/L (ref 0.4–2.0)

## 2024-03-31 LAB — MAGNESIUM: MAGNESIUM: 1.8 mg/dL (ref 1.8–2.4)

## 2024-03-31 MED ORDER — DEXTROSE 5 % AND 0.9 % SODIUM CHLORIDE INTRAVENOUS SOLUTION
INTRAVENOUS | Status: DC
Start: 2024-03-31 — End: 2024-04-01
  Administered 2024-04-01: 0 mL via INTRAVENOUS

## 2024-03-31 MED ORDER — SODIUM CHLORIDE 0.9 % IV BOLUS
1000.0000 mL | INJECTION | Status: AC
Start: 2024-03-31 — End: 2024-03-31
  Administered 2024-03-31: 0 mL via INTRAVENOUS
  Administered 2024-03-31: 1000 mL via INTRAVENOUS

## 2024-03-31 MED ORDER — KETOROLAC 30 MG/ML (1 ML) INJECTION SOLUTION
INTRAMUSCULAR | Status: AC
Start: 2024-03-31 — End: 2024-03-31
  Filled 2024-03-31: qty 1

## 2024-03-31 MED ORDER — SODIUM CHLORIDE 0.9 % IV BOLUS
1000.0000 mL | INJECTION | Status: AC
Start: 2024-03-31 — End: 2024-03-31
  Administered 2024-03-31: 1000 mL via INTRAVENOUS
  Administered 2024-03-31: 0 mL via INTRAVENOUS
  Administered 2024-03-31: 1000 mL via INTRAVENOUS

## 2024-03-31 MED ORDER — SODIUM CHLORIDE 0.9 % (FLUSH) INJECTION SYRINGE
3.0000 mL | INJECTION | INTRAMUSCULAR | Status: DC | PRN
Start: 2024-03-31 — End: 2024-04-03

## 2024-03-31 MED ORDER — ONDANSETRON HCL (PF) 4 MG/2 ML INJECTION SOLUTION
INTRAMUSCULAR | Status: AC
Start: 2024-03-31 — End: 2024-03-31
  Filled 2024-03-31: qty 2

## 2024-03-31 MED ORDER — SODIUM CHLORIDE 0.9 % INTRAVENOUS SOLUTION
0.0000 [IU]/h | INTRAVENOUS | Status: DC
Start: 2024-03-31 — End: 2024-04-01
  Administered 2024-03-31: 3 [IU]/h via INTRAVENOUS
  Administered 2024-03-31 – 2024-04-01 (×2): 0 [IU]/h via INTRAVENOUS
  Filled 2024-03-31 (×8): qty 100

## 2024-03-31 MED ORDER — METOPROLOL TARTRATE 5 MG/5 ML INTRAVENOUS SOLUTION
5.0000 mg | INTRAVENOUS | Status: AC
Start: 2024-03-31 — End: 2024-03-31
  Administered 2024-03-31: 5 mg via INTRAVENOUS

## 2024-03-31 MED ORDER — PANTOPRAZOLE 40 MG INTRAVENOUS SOLUTION
INTRAVENOUS | Status: AC
Start: 2024-03-31 — End: 2024-03-31
  Filled 2024-03-31: qty 20

## 2024-03-31 MED ORDER — ONDANSETRON HCL (PF) 4 MG/2 ML INJECTION SOLUTION
4.0000 mg | INTRAMUSCULAR | Status: AC
Start: 2024-03-31 — End: 2024-03-31
  Administered 2024-03-31: 4 mg via INTRAVENOUS

## 2024-03-31 MED ORDER — KETOROLAC 30 MG/ML (1 ML) INJECTION SOLUTION
15.0000 mg | INTRAMUSCULAR | Status: AC
Start: 2024-03-31 — End: 2024-03-31
  Administered 2024-03-31: 15 mg via INTRAVENOUS

## 2024-03-31 MED ORDER — SODIUM CHLORIDE 0.9 % IV BOLUS
1000.0000 mL | INJECTION | Status: AC
Start: 2024-03-31 — End: 2024-03-31
  Administered 2024-03-31: 1000 mL via INTRAVENOUS
  Administered 2024-03-31: 0 mL via INTRAVENOUS

## 2024-03-31 MED ORDER — INSULIN REGULAR HUMAN 100 UNIT/ML INJ FOR MIXTURES -RX ADMIN UNIT ROUNDS TO NEAREST 0.01 ML
INTRAMUSCULAR | Status: AC
Start: 2024-03-31 — End: 2024-03-31
  Filled 2024-03-31: qty 1

## 2024-03-31 MED ORDER — SODIUM CHLORIDE 0.9 % (FLUSH) INJECTION SYRINGE
3.0000 mL | INJECTION | Freq: Three times a day (TID) | INTRAMUSCULAR | Status: DC
Start: 2024-03-31 — End: 2024-04-03
  Administered 2024-03-31: 0 mL
  Administered 2024-04-01 – 2024-04-03 (×7): 3 mL

## 2024-03-31 MED ORDER — NICOTINE 21 MG/24 HR DAILY TRANSDERMAL PATCH
MEDICATED_PATCH | TRANSDERMAL | Status: AC
Start: 2024-03-31 — End: 2024-03-31
  Filled 2024-03-31: qty 1

## 2024-03-31 MED ORDER — METOPROLOL TARTRATE 5 MG/5 ML INTRAVENOUS SOLUTION
INTRAVENOUS | Status: AC
Start: 2024-03-31 — End: 2024-03-31
  Filled 2024-03-31: qty 5

## 2024-03-31 MED ORDER — PANTOPRAZOLE 40 MG INTRAVENOUS SOLUTION
80.0000 mg | INTRAVENOUS | Status: AC
Start: 2024-03-31 — End: 2024-03-31
  Administered 2024-03-31: 80 mg via INTRAVENOUS

## 2024-03-31 MED ORDER — NICOTINE 21 MG/24 HR DAILY TRANSDERMAL PATCH
21.0000 mg | MEDICATED_PATCH | TRANSDERMAL | Status: AC
Start: 2024-03-31 — End: 2024-04-01
  Administered 2024-03-31: 21 mg via TRANSDERMAL

## 2024-03-31 NOTE — ED Nurses Note (Signed)
 Per MD, DKA guidelines. Insulin  and d5 held for one hour and until Labs are redrawn. Iv insulin  and d5w held as per orders.

## 2024-03-31 NOTE — ED Nurses Note (Signed)
 PT able to ambulate with nusre at side to restroom with slow steady gait noted.

## 2024-03-31 NOTE — ED Provider Notes (Signed)
 [facility], [place] - Emergency Department  ED Primary Note  History of Present Illness   Gail Morris is a 58 y.o. female who had concerns including Vomiting. Pt states she had teeth pulled yesterday can't eat or drink had nv . States she thinks she vomited blood poss swallowed from teeth bleeding. Abd burns. States hungry but cannot eat due to the teeth hurting. Denies cp sob.  States upper abd burns.  Review of Systems   Constitutional: No fever, chills + weakness   Skin: No rash or diaphoresis  HENT: No headaches, or congestion+ dental pain   Eyes: No vision changes or photophobia   Cardio: No chest pain, palpitations or leg swelling   Respiratory: No cough, wheezing or SOB  GI:  + nausea, vomiting poss blood in it no stool changes  GU:  No dysuria, hematuria, or increased frequency  MSK: No muscle aches, joint or back pain  Neuro: No seizures, LOC, numbness, tingling, or focal weakness  Psychiatric: No depression, SI or substance abuse  All other systems reviewed and are negative.      Physical Exam   ED Triage Vitals [03/31/24 1823]   BP (Non-Invasive) (!) 173/107   Heart Rate (!) 104   Respiratory Rate 16   Temperature 37.3 C (99.2 F)   SpO2 98 %   Weight 51.7 kg (114 lb)   Height 1.575 m (5' 2)     Constitutional:  58 y.o. female who appears in no distress. Normal color, no cyanosis.   HENT:   Head: Normocephalic and atraumatic.   Mouth/Throat: Oropharynx is clear and moist. Mod swelling lower gums   Eyes: EOMI, PERRL   Neck: Trachea midline. Neck supple.  Cardiovascular: RRR, No murmurs, rubs or gallops. Intact distal pulses.  Pulmonary/Chest: BS equal bilaterally. No respiratory distress. No wheezes, rales or chest tenderness.   Abdominal: Bowel sounds present and normal. Abdomen soft, mild mid epigastric  tenderness, no rebound and no guarding.  Back: No midline spinal tenderness, no paraspinal tenderness, no CVA tenderness.           Musculoskeletal: No edema, tenderness or  deformity.  Skin: warm and dry. No rash, erythema, pallor or cyanosis  Psychiatric: normal mood and affect. Behavior is normal.   Neurological: Patient keenly alert and responsive, easily able to raise eyebrows, facial muscles/expressions symmetric, speaking in fluent sentences, moving all extremities equally and fully, normal gait  Patient Data   Labs Ordered/Reviewed   COMPREHENSIVE METABOLIC PANEL, NON-FASTING - Abnormal; Notable for the following components:       Result Value    SODIUM 135 (*)     CO2 TOTAL 18 (*)     ANION GAP 16 (*)     BUN 20 (*)     GLUCOSE 268 (*)     ALKALINE PHOSPHATASE 128 (*)     PROTEIN TOTAL 8.3 (*)     All other components within normal limits    Narrative:     Estimated Glomerular Filtration Rate (eGFR) is calculated using the CKD-EPI (2021) equation, intended for patients 45 years of age and older. If gender is not documented or unknown, there will be no eGFR calculation.   ACETONE - Abnormal; Notable for the following components:    ACETONE Small (*)     All other components within normal limits   BLOOD GAS W/ LACTATE REFLEX - Abnormal; Notable for the following components:    PH (VENOUS) 7.29 (*)     PCO2 (  VENOUS) 33 (*)     BICARBONATE (VENOUS) 16.7 (*)     BASE DEFICIT 9.6 (*)     LACTATE 2.4 (*)     All other components within normal limits    Narrative:     VBG DRAWN BY RN  ROOM AIR  Manufacturer does not recommend venous sample for assessment of patient oxygenation status. A reference range for pO2, Oxyhemoglobin and Oxygen Saturation is provided but abnormal results will not flag in Epic.   CBC WITH DIFF - Abnormal; Notable for the following components:    HGB 15.6 (*)     HCT 44.7 (*)     NEUTROPHIL % 81 (*)     LYMPHOCYTE % 14 (*)     EOSINOPHIL % 0 (*)     All other components within normal limits   URINALYSIS, MACRO/MICRO - Abnormal; Notable for the following components:    GLUCOSE >=1000 (*)     KETONES >=80 (*)     All other components within normal limits   POC  BLOOD GLUCOSE (RESULTS) - Abnormal; Notable for the following components:    GLUCOSE, POC 218 (*)     All other components within normal limits   OCCULT BLOOD (PRN ED USE ONLY) - Normal   LIPASE - Normal   MAGNESIUM  - Normal   TROPONIN-I - Normal    Narrative:     Values received on females ranging between 12-15 ng/L MUST include the next serial troponin to review changes in the delta differences as the reference range for the Access II chemistry analyzer is lower than the established reference range.     CBC/DIFF    Narrative:     The following orders were created for panel order CBC/DIFF.  Procedure                               Abnormality         Status                     ---------                               -----------         ------                     CBC WITH DIFF[[id number]]                Abnormal            Final result                 Please view results for these tests on the individual orders.   URINALYSIS WITH REFLEX MICROSCOPIC AND CULTURE IF POSITIVE    Narrative:     The following orders were created for panel order URINALYSIS WITH REFLEX MICROSCOPIC AND CULTURE IF POSITIVE.  Procedure                               Abnormality         Status                     ---------                               -----------         ------  URINALYSIS, MACRO/MICRO[[id number]]      Abnormal            Final result                 Please view results for these tests on the individual orders.   LACTIC ACID - FIRST REFLEX   PERFORM POC WHOLE BLOOD GLUCOSE   PERFORM POC WHOLE BLOOD GLUCOSE   PERFORM POC WHOLE BLOOD GLUCOSE   PERFORM POC WHOLE BLOOD GLUCOSE   PERFORM POC WHOLE BLOOD GLUCOSE     CT ABDOMEN PELVIS WO IV CONTRAST   Final Result by Edi, Radresults In (06/18 1949)   No mesenteric inflammation, free fluid or free air   Limited gastric wall assessment due to incomplete distention. Possible reflux in the distal esophagus. Consider endoscopy if continued clinical concern for this area.    Incidental findings as detailed above         Radiologist location ID: [id number]         XR CHEST AP   Final Result by Edi, Radresults In (06/18 1926)   NO ACUTE FINDINGS.            Radiologist location ID: [id number]           Medical Decision Making   Diff dx dka dehydration dental abscess gi bleed    Small acetone. Wbc 9.7  ph 7. 29, stool occult neg .anion gap 16  gluc 268 trop 5 no st elevation abd at gastritis    Plan to admit.       Medications Ordered/Administered in the ED   NS flush syringe (has no administration in time range)   NS flush syringe (has no administration in time range)   NS bolus infusion 1,000 mL (has no administration in time range)   insulin  regular human 100 Units in NS 100 mL infusion (has no administration in time range)   NS bolus infusion 1,000 mL (1,000 mL Intravenous New Bag/New Syringe 03/31/24 1912)   NS bolus infusion 1,000 mL (1,000 mL Intravenous New Bag/New Syringe 03/31/24 1900)   ondansetron  (ZOFRAN ) 2 mg/mL injection (4 mg Intravenous Given 03/31/24 1911)   ketorolac  (TORADOL ) 30 mg/mL injection (15 mg Intravenous Given 03/31/24 1911)   pantoprazole (PROTONIX) 4 mg/mL injection (80 mg Intravenous Given 03/31/24 1911)   metoprolol  (LOPRESSOR ) 1 mg/mL injection (5 mg Intravenous Given 03/31/24 1916)     Clinical Impression   DKA (diabetic ketoacidosis) (Primary)   Pain, dental   Hypertension, unspecified type   Dehydration       Disposition: Admitted

## 2024-03-31 NOTE — Progress Notes (Signed)
 Discussed with dr. Levin Reamer. He accepted the pt she is feeling some better no nv. Occult stool neg. Tolerated ice chips

## 2024-04-01 ENCOUNTER — Encounter (HOSPITAL_COMMUNITY): Payer: Self-pay

## 2024-04-01 DIAGNOSIS — Z794 Long term (current) use of insulin: Secondary | ICD-10-CM

## 2024-04-01 DIAGNOSIS — I499 Cardiac arrhythmia, unspecified: Secondary | ICD-10-CM

## 2024-04-01 DIAGNOSIS — E119 Type 2 diabetes mellitus without complications: Secondary | ICD-10-CM

## 2024-04-01 DIAGNOSIS — E1110 Type 2 diabetes mellitus with ketoacidosis without coma: Principal | ICD-10-CM | POA: Diagnosis present

## 2024-04-01 DIAGNOSIS — I4891 Unspecified atrial fibrillation: Secondary | ICD-10-CM

## 2024-04-01 DIAGNOSIS — Z98818 Other dental procedure status: Secondary | ICD-10-CM

## 2024-04-01 LAB — POC BLOOD GLUCOSE (RESULTS)
GLUCOSE, POC: 120 mg/dL — ABNORMAL HIGH (ref 70–100)
GLUCOSE, POC: 142 mg/dL — ABNORMAL HIGH (ref 70–100)
GLUCOSE, POC: 146 mg/dL — ABNORMAL HIGH (ref 70–100)
GLUCOSE, POC: 151 mg/dL — ABNORMAL HIGH (ref 70–100)
GLUCOSE, POC: 179 mg/dL — ABNORMAL HIGH (ref 70–100)
GLUCOSE, POC: 186 mg/dL — ABNORMAL HIGH (ref 70–100)
GLUCOSE, POC: 225 mg/dL — ABNORMAL HIGH (ref 70–100)

## 2024-04-01 LAB — BASIC METABOLIC PANEL
ANION GAP: 10 mmol/L (ref 4–13)
ANION GAP: 13 mmol/L (ref 4–13)
ANION GAP: 9 mmol/L (ref 4–13)
BUN/CREA RATIO: 31
BUN/CREA RATIO: 34 — ABNORMAL HIGH (ref 6–22)
BUN/CREA RATIO: 41 — ABNORMAL HIGH (ref 6–22)
BUN: 12 mg/dL (ref 7–25)
BUN: 16 mg/dL (ref 7–25)
BUN: 18 mg/dL (ref 7–18)
CALCIUM: 8.3 mg/dL — ABNORMAL LOW (ref 8.6–10.3)
CALCIUM: 8.4 mg/dL — ABNORMAL LOW (ref 8.5–10.1)
CALCIUM: 8.9 mg/dL (ref 8.6–10.3)
CHLORIDE: 105 mmol/L (ref 98–107)
CHLORIDE: 108 mmol/L — ABNORMAL HIGH (ref 98–107)
CHLORIDE: 110 mmol/L — ABNORMAL HIGH (ref 98–107)
CO2 TOTAL: 17 mmol/L — ABNORMAL LOW (ref 21–31)
CO2 TOTAL: 19 mmol/L — ABNORMAL LOW (ref 21–32)
CO2 TOTAL: 20 mmol/L — ABNORMAL LOW (ref 21–31)
CREATININE: 0.35 mg/dL — ABNORMAL LOW (ref 0.60–1.30)
CREATININE: 0.39 mg/dL — ABNORMAL LOW (ref 0.60–1.30)
CREATININE: 0.58 mg/dL (ref 0.55–1.02)
ESTIMATED GFR: 106 mL/min/{1.73_m2} (ref >59)
ESTIMATED GFR: 117 mL/min/{1.73_m2} (ref >59)
ESTIMATED GFR: 120 mL/min/{1.73_m2} (ref >59)
GLUCOSE: 152 mg/dL — ABNORMAL HIGH (ref 74–106)
GLUCOSE: 169 mg/dL — ABNORMAL HIGH (ref 74–109)
GLUCOSE: 223 mg/dL — ABNORMAL HIGH (ref 74–109)
OSMOLALITY, CALCULATED: 279 mosm/kg (ref 270–290)
OSMOLALITY, CALCULATED: 279 mosm/kg (ref 270–290)
OSMOLALITY, CALCULATED: 280 mosm/kg (ref 270–290)
POTASSIUM: 3.3 mmol/L — ABNORMAL LOW (ref 3.5–5.1)
POTASSIUM: 3.4 mmol/L — ABNORMAL LOW (ref 3.5–5.1)
POTASSIUM: 3.6 mmol/L (ref 3.5–5.1)
SODIUM: 136 mmol/L (ref 136–145)
SODIUM: 137 mmol/L (ref 136–145)
SODIUM: 138 mmol/L (ref 136–145)

## 2024-04-01 LAB — CREATINE KINASE (CK), TOTAL, SERUM OR PLASMA: CREATINE KINASE: 43 U/L (ref 30–223)

## 2024-04-01 LAB — HEPATIC FUNCTION PANEL
ALBUMIN/GLOBULIN RATIO: 1.3 (ref 0.8–1.4)
ALBUMIN: 3.7 g/dL (ref 3.5–5.7)
ALKALINE PHOSPHATASE: 78 U/L (ref 34–104)
ALT (SGPT): 10 U/L (ref 7–52)
AST (SGOT): 13 U/L (ref 13–39)
BILIRUBIN DIRECT: 0.09 md/dL (ref 0.03–0.18)
BILIRUBIN TOTAL: 0.8 mg/dL (ref 0.3–1.0)
BILIRUBIN, INDIRECT: 0.71 mg/dL (ref <=1)
GLOBULIN: 2.8 (ref 2.0–3.5)
PROTEIN TOTAL: 6.5 g/dL (ref 6.4–8.9)

## 2024-04-01 LAB — CBC WITH DIFF
BASOPHIL #: 0.1 10*3/uL (ref 0.00–0.10)
BASOPHIL %: 1 % (ref 0–1)
EOSINOPHIL #: 0.1 10*3/uL (ref 0.00–0.50)
EOSINOPHIL %: 1 % (ref 1–7)
HCT: 41.6 % (ref 31.2–41.9)
HGB: 14.1 g/dL (ref 10.9–14.3)
LYMPHOCYTE #: 2.3 10*3/uL (ref 1.10–3.10)
LYMPHOCYTE %: 26 % (ref 16–46)
MCH: 31.7 pg (ref 24.7–32.8)
MCHC: 34 g/dL (ref 32.3–35.6)
MCV: 93.4 fL (ref 75.5–95.3)
MONOCYTE #: 0.7 10*3/uL (ref 0.20–0.90)
MONOCYTE %: 8 % (ref 4–11)
MPV: 7.9 fL (ref 7.9–10.8)
NEUTROPHIL #: 5.8 10*3/uL (ref 1.90–8.20)
NEUTROPHIL %: 64 % (ref 43–77)
PLATELETS: 230 10*3/uL (ref 140–440)
RBC: 4.46 10*6/uL (ref 3.63–4.92)
RDW: 13.5 % (ref 12.3–17.7)
WBC: 9 10*3/uL (ref 3.8–11.8)

## 2024-04-01 LAB — ECG 12 LEAD
Atrial Rate: 92 {beats}/min
Atrial Rate: 78 {beats}/min
Calculated P Axis: 60 degrees
Calculated P Axis: 72 degrees
Calculated R Axis: 51 degrees
Calculated R Axis: 51 degrees
Calculated T Axis: 62 degrees
Calculated T Axis: 65 degrees
PR Interval: 162 ms
PR Interval: 168 ms
QRS Duration: 78 ms
QRS Duration: 86 ms
QT Interval: 380 ms
QT Interval: 424 ms
QTC Calculation: 469 ms
QTC Calculation: 483 ms
Ventricular rate: 92 {beats}/min
Ventricular rate: 78 {beats}/min

## 2024-04-01 LAB — MAGNESIUM: MAGNESIUM: 1.7 mg/dL — ABNORMAL LOW (ref 1.9–2.7)

## 2024-04-01 LAB — BLOOD GAS
%FIO2 (VENOUS): 21 %
BASE DEFICIT: 5.8 mmol/L — ABNORMAL HIGH (ref 0.0–3.0)
BICARBONATE (VENOUS): 20.5 mmol/L — ABNORMAL LOW (ref 22.0–29.0)
O2 SATURATION (VENOUS): 99.8 % (ref 40.0–85.0)
PCO2 (VENOUS): 20 mmHg — ABNORMAL LOW (ref 41–51)
PH (VENOUS): 7.49 — ABNORMAL HIGH (ref 7.32–7.43)
PO2 (VENOUS): 202 mmHg (ref 35–50)

## 2024-04-01 LAB — GRAY TOP TUBE

## 2024-04-01 LAB — LACTIC ACID LEVEL W/ REFLEX FOR LEVEL >2.0: LACTIC ACID: 0.5 mmol/L (ref 0.5–2.2)

## 2024-04-01 LAB — PHOSPHORUS: PHOSPHORUS: 2 mg/dL — ABNORMAL LOW (ref 3.7–7.2)

## 2024-04-01 LAB — GOLD TOP TUBE

## 2024-04-01 LAB — BLUE TOP TUBE

## 2024-04-01 LAB — LAVENDER TOP TUBE

## 2024-04-01 LAB — B-TYPE NATRIURETIC PEPTIDE (BNP),PLASMA: BNP: 116 pg/mL — ABNORMAL HIGH (ref 1–100)

## 2024-04-01 LAB — BETA-HYDROXYBUTYRATE: BETA-HYDROXYBUTYRATE: 0.93 mmol/L — ABNORMAL HIGH (ref 0.02–0.27)

## 2024-04-01 LAB — TROPONIN-I: TROPONIN I: 8 ng/L (ref <15)

## 2024-04-01 MED ORDER — DOCUSATE SODIUM 100 MG CAPSULE
100.0000 mg | ORAL_CAPSULE | Freq: Two times a day (BID) | ORAL | Status: DC | PRN
Start: 2024-04-01 — End: 2024-04-02

## 2024-04-01 MED ORDER — MAGNESIUM SULFATE 1 GRAM/100 ML IN DEXTROSE 5 % INTRAVENOUS PIGGYBACK
1.0000 g | INJECTION | INTRAVENOUS | Status: AC
Start: 2024-04-01 — End: 2024-04-01
  Administered 2024-04-01 (×2): 1 g via INTRAVENOUS
  Administered 2024-04-01 (×2): 0 g via INTRAVENOUS
  Filled 2024-04-01 (×2): qty 100

## 2024-04-01 MED ORDER — INSULIN GLARGINE 100 UNITS/ML SUBQ - CHARGE BY DOSE
10.0000 [IU] | Freq: Every evening | SUBCUTANEOUS | Status: DC
Start: 2024-04-01 — End: 2024-04-03
  Administered 2024-04-01 – 2024-04-02 (×3): 10 [IU] via SUBCUTANEOUS
  Filled 2024-04-01 (×2): qty 10

## 2024-04-01 MED ORDER — FAMOTIDINE 20 MG TABLET
ORAL_TABLET | ORAL | Status: AC
Start: 2024-04-01 — End: 2024-04-01
  Filled 2024-04-01: qty 1

## 2024-04-01 MED ORDER — ENOXAPARIN 40 MG/0.4 ML SUBCUTANEOUS SYRINGE
40.0000 mg | INJECTION | Freq: Every day | SUBCUTANEOUS | Status: DC
Start: 2024-04-02 — End: 2024-04-03
  Administered 2024-04-02 – 2024-04-03 (×2): 40 mg via SUBCUTANEOUS
  Filled 2024-04-01 (×2): qty 0.4

## 2024-04-01 MED ORDER — GABAPENTIN 400 MG CAPSULE
800.0000 mg | ORAL_CAPSULE | Freq: Three times a day (TID) | ORAL | Status: DC
Start: 2024-04-01 — End: 2024-04-03
  Administered 2024-04-01 – 2024-04-03 (×7): 800 mg via ORAL
  Filled 2024-04-01 (×7): qty 2

## 2024-04-01 MED ORDER — PANTOPRAZOLE 40 MG INTRAVENOUS SOLUTION
40.0000 mg | Freq: Every day | INTRAVENOUS | Status: DC
Start: 2024-04-01 — End: 2024-04-02
  Administered 2024-04-01 – 2024-04-02 (×2): 40 mg via INTRAVENOUS
  Filled 2024-04-01 (×2): qty 10

## 2024-04-01 MED ORDER — NICOTINE 21 MG/24 HR DAILY TRANSDERMAL PATCH
21.0000 mg | MEDICATED_PATCH | TRANSDERMAL | Status: DC
Start: 2024-04-01 — End: 2024-04-03
  Administered 2024-04-01 – 2024-04-02 (×2): 21 mg via TRANSDERMAL
  Filled 2024-04-01 (×2): qty 1

## 2024-04-01 MED ORDER — DEXTROSE 50 % IN WATER (D50W) INTRAVENOUS SYRINGE
12.5000 g | INJECTION | INTRAVENOUS | Status: DC | PRN
Start: 2024-04-01 — End: 2024-04-03

## 2024-04-01 MED ORDER — MULTIVITAMIN WITH FOLIC ACID 400 MCG TABLET
1.0000 | ORAL_TABLET | Freq: Every day | ORAL | Status: DC
Start: 2024-04-01 — End: 2024-04-03
  Administered 2024-04-01 – 2024-04-03 (×3): 1 via ORAL
  Filled 2024-04-01 (×3): qty 1

## 2024-04-01 MED ORDER — DEXTROSE 40 % ORAL GEL
15.0000 g | ORAL | Status: DC | PRN
Start: 2024-04-01 — End: 2024-04-03

## 2024-04-01 MED ORDER — ONDANSETRON HCL (PF) 4 MG/2 ML INJECTION SOLUTION
4.0000 mg | Freq: Four times a day (QID) | INTRAMUSCULAR | Status: DC | PRN
Start: 2024-04-01 — End: 2024-04-03
  Administered 2024-04-01: 4 mg via INTRAVENOUS
  Filled 2024-04-01: qty 2

## 2024-04-01 MED ORDER — ALUMINUM-MAG HYDROXIDE-SIMETHICONE 200 MG-200 MG-20 MG/5 ML ORAL SUSP
30.0000 mL | ORAL | Status: DC | PRN
Start: 2024-04-01 — End: 2024-04-03

## 2024-04-01 MED ORDER — INSULIN LISPRO 100 UNIT/ML SUB-Q SSIP VIAL
3.0000 [IU] | INJECTION | Freq: Four times a day (QID) | SUBCUTANEOUS | Status: DC
Start: 2024-04-01 — End: 2024-04-03
  Administered 2024-04-01: 3 [IU] via SUBCUTANEOUS
  Administered 2024-04-01: 0 [IU] via SUBCUTANEOUS
  Administered 2024-04-01: 5 [IU] via SUBCUTANEOUS
  Administered 2024-04-01: 3 [IU] via SUBCUTANEOUS
  Administered 2024-04-02 (×2): 0 [IU] via SUBCUTANEOUS
  Administered 2024-04-02 – 2024-04-03 (×4): 5 [IU] via SUBCUTANEOUS
  Filled 2024-04-01 (×3): qty 5
  Filled 2024-04-01: qty 3
  Filled 2024-04-01: qty 5
  Filled 2024-04-01: qty 3
  Filled 2024-04-01: qty 5

## 2024-04-01 MED ORDER — SODIUM DI- AND MONOPHOSPHATE-POTASSIUM PHOS MONOBASIC 250 MG TABLET
250.0000 mg | ORAL_TABLET | Freq: Four times a day (QID) | ORAL | Status: DC
Start: 2024-04-01 — End: 2024-04-03
  Administered 2024-04-01 – 2024-04-03 (×9): 250 mg via ORAL
  Filled 2024-04-01 (×9): qty 1

## 2024-04-01 MED ORDER — INSULIN GLARGINE 100 UNITS/ML SUBQ - CHARGE BY DOSE
SUBCUTANEOUS | Status: AC
Start: 2024-04-01 — End: 2024-04-01
  Filled 2024-04-01: qty 1

## 2024-04-01 MED ORDER — FOLIC ACID 1 MG TABLET
1.0000 mg | ORAL_TABLET | Freq: Every day | ORAL | Status: DC
Start: 2024-04-01 — End: 2024-04-03
  Administered 2024-04-01 – 2024-04-03 (×3): 1 mg via ORAL
  Filled 2024-04-01 (×3): qty 1

## 2024-04-01 MED ORDER — ACETAMINOPHEN 325 MG TABLET
650.0000 mg | ORAL_TABLET | ORAL | Status: DC | PRN
Start: 2024-04-01 — End: 2024-04-03
  Administered 2024-04-01 – 2024-04-03 (×4): 650 mg via ORAL
  Filled 2024-04-01 (×4): qty 2

## 2024-04-01 MED ORDER — LACTATED RINGERS INTRAVENOUS SOLUTION
INTRAVENOUS | Status: DC
Start: 2024-04-01 — End: 2024-04-03

## 2024-04-01 MED ORDER — ASPIRIN 81 MG CHEWABLE TABLET
81.0000 mg | CHEWABLE_TABLET | Freq: Every day | ORAL | Status: DC
Start: 2024-04-01 — End: 2024-04-03
  Administered 2024-04-01 – 2024-04-03 (×3): 81 mg via ORAL
  Filled 2024-04-01 (×3): qty 1

## 2024-04-01 MED ORDER — GLUCAGON HCL 1 MG/ML SOLUTION FOR INJECTION
1.0000 mg | Freq: Once | INTRAMUSCULAR | Status: DC | PRN
Start: 2024-04-01 — End: 2024-04-03

## 2024-04-01 NOTE — Respiratory Therapy (Signed)
 Patient states she has been trying to cut down.  She may be interested in our smoking cessation program.  I did leave a pamphlet with patient.

## 2024-04-01 NOTE — Nurses Notes (Signed)
 Patient reporting pain due to recent surgery with teeth, pain is a 4 out of 10, continuous. Patient requesting PRN Tylenol .

## 2024-04-01 NOTE — H&P (Signed)
 Are the  [facility] MEDICINE   [facility]     HOSPITALIST H&P    [name], 58 y.o. female Date of Service: 04/01/2024       Date of Birth: 07-23-1966  Medical Record Number: [id number]  Date of Admission:  03/31/2024  Inpatient Admission Date: 04/01/2024   Attending: Mulindwa, Deo, MD   Service: [facility] HOSPITALIST 1  PCP: Gail Brush, DO 430/B  Code: FULL CODE: ATTEMPT RESUSCITATION/CPR  LOS 0        CHIEF COMPLAINT:   Vomiting (Ems reports pt had  5 teeth pulled 2 days ago, nausea and vomiting since yest. Pt noticed some blood in vomitus. Blood sugar 321, hx DM, taking Lantus  at home. IV 22 ga left forearm, normal saline 400 ml  infused in route. Ems gave Zofran  4 mg ODT in route . Ems reports pt had atrial flutter 114 but then sinus tach on monitor. Pt has not been able to feel the amoxil  rx )    HPI:   Gail Morris 58 y.o. female        Patient states that a few days ago she had her teeth pulled and ever since the procedure she has had nausea vomiting.  She claims that she has had some dry heaves, even though initially she had a little bit of blood mixed with the vomit.  She is not feeling well at all and decided to come to [facility] the ER to be evaluated.  She denies any fever chest pain abdominal pain.  Patient here to [facility] was found to have elevated beta hydroxybutyrate and slightly elevated anion gap placed on an insulin  drip there and patient improves the point where anion gap closed.  She was sent here for continued and med.                                                      ROS: - :  Ros - complete: Other than ROS in the HPI, all other systems were negative.  HPI Information Obtained from: patient    ED MEDICATIONS:   Medications Ordered/Administered in the ED   NS flush syringe (has no administration in time range)   NS flush syringe (has no administration in time range)   insulin  regular human 100 Units in NS 100 mL infusion (has no administration in time range)   NS bolus infusion 1,000  mL (1,000 mL Intravenous New Bag/New Syringe 03/31/24 1912)   NS bolus infusion 1,000 mL (1,000 mL Intravenous New Bag/New Syringe 03/31/24 1900)   ondansetron  (ZOFRAN ) 2 mg/mL injection (4 mg Intravenous Given 03/31/24 1911)   ketorolac  (TORADOL ) 30 mg/mL injection (15 mg Intravenous Given 03/31/24 1911)   pantoprazole (PROTONIX) 4 mg/mL injection (80 mg Intravenous Given 03/31/24 1911)   NS bolus infusion 1,000 mL (has no administration in time range)   metoprolol  (LOPRESSOR ) 1 mg/mL injection (5 mg Intravenous Given 03/31/24 1916)          MEDICAL HISTORY:     Past Medical History:   Diagnosis Date    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    Anxiety     Breast cancer     Cancer (CMS HCC)     Depression     Diabetes mellitus, type 2  Esophageal reflux     H/O blood clots     H/O foot surgery     Hiccups     HTN (hypertension)     Migraine     Neuropathy (CMS HCC)     Primary generalized (osteo)arthritis      Past Surgical History:   Procedure Laterality Date    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 She has never used smokeless tobacco. She reports current drug use. Drug: Marijuana. She reports that she does not drink alcohol.    E-Cigarettes/Vaping    E-Cigarette/Vaping Use Never User        Family Medical History:       Problem Relation (Age of Onset)    Diabetes type II Mother    No Known Problems Father             --      ALLERGIES:    Allergies[2]      MEDICATIONS  Prior to Admission medications    Medication Sig Start Date End Date Taking? Authorizing Provider   ACCU-CHEK  GUIDE ME GLUCOSE MTR Does not apply Misc Four times a day 10/02/23   Provider, Historical   ACCU-CHEK GUIDE TEST STRIPS Does not apply Strip Twice daily 10/07/23   Provider, Historical   ACCU-CHEK SOFTCLIX LANCETS Misc Apply topically Twice daily 10/07/23   Provider, Historical   acetaminophen  (TYLENOL ) 500 mg Oral Tablet Take 2 Tablets (1,000 mg total) by mouth Every 6 hours as needed for Pain 01/04/24   Larue Pock, DO   aspirin  (ECOTRIN) 81 mg Oral Tablet, Delayed Release (E.C.) Take 1 Tablet (81 mg total) by mouth Daily 08/26/23   Provider, Historical   BD UF MINI PEN NEEDLE 31 gauge x 3/16 Needle SMARTSIG:SUB-Q 12/10/23   Provider, Historical   empagliflozin (JARDIANCE) 25 mg Oral Tablet Take 1 Tablet (25 mg total) by mouth Once a day    Provider, Historical   ergocalciferol, vitamin D2, (DRISDOL) 1,250 mcg (50,000 unit) Oral Capsule Take 1 Capsule (50,000 Units total) by mouth Every 7 days 08/26/23   Provider, Historical   flash glucose scanning reader (FREESTYLE LIBRE 14 DAY READER) Does not apply Misc USE TO CHECK GLUCOSE AS DIRECTED 4 TIMES DAILY    Provider, Historical   fluticasone propionate (FLONASE) 50 mcg/actuation Nasal Spray, Suspension Administer 1 Spray into each nostril Once per day as needed 09/24/23   Provider, Historical   gabapentin (NEURONTIN) 100 mg Oral Capsule 1 Capsule (100 mg total) Three times a day 03/18/22   Provider, Historical   gabapentin (NEURONTIN) 800 mg Oral Tablet Take 1 Tablet (800 mg total) by mouth Three times a day    Provider, Historical   hydrOXYzine HCL (ATARAX) 50 mg Oral Tablet Take 1 Tablet (50 mg total) by mouth Four times a day as needed 08/26/23   Provider, Historical   Insulin  Syringe-Needle U-100 0.3 mL 29 gauge x 1/2 Syringe  Daily 08/26/23   Provider, Historical   methocarbamoL  (ROBAXIN ) 500 mg Oral Tablet Take 1 Tablet (500 mg total) by mouth Four times a day as needed for Other (Muscle spasms) for up to 5 days 03/05/24 03/10/24  Jodelle Mungo, DO   naproxen   (NAPROSYN ) 500 mg Oral Tablet Take 1 Tablet (500 mg total) by mouth Twice daily with food 03/05/24   Petrarca, Robert, DO   NURTEC ODT 75 mg Oral Tablet, Rapid Dissolve Place 1 Tablet (75 mg total) under the tongue Every 24 hours as needed 08/18/23   Provider, Historical   ondansetron  (ZOFRAN  ODT) 4 mg Oral Tablet, Rapid Dissolve Take 1 Tablet (4 mg total) by mouth Four times a day as needed 08/26/23   Provider, Historical   ramipriL (ALTACE) 10 mg Oral Capsule Take 1 Capsule (10 mg total) by mouth Once a day    Provider, Historical   simvastatin (ZOCOR) 10 mg Oral Tablet SMARTSIG:1 Tablet(s) By Mouth Every Evening 08/26/23   Provider, Historical   TOUJEO  MAX U-300 SOLOSTAR 300 unit/mL (3 mL) Subcutaneous Insulin  Pen Inject 55 Units under the skin Once a day 10/13/23   Provider, Historical   VENTOLIN  HFA 90 mcg/actuation Inhalation oral inhaler Take 2 Puffs by inhalation Every 4 hours as needed 08/26/23   Provider, Historical   aspirin  81 mg Oral Tablet, Chewable Chew 1 Tablet (81 mg total) Once a day  03/05/24  Provider, Historical   prochlorperazine  (COMPAZINE ) 10 mg Oral Tablet Take 1 Tablet (10 mg total) by mouth Every 6 hours as needed for Nausea/Vomiting 01/04/24 03/05/24  Larue Pock, DO     Medications Prior to Admission       Prescriptions    ACCU-CHEK GUIDE ME GLUCOSE MTR Does not apply Misc    Four times a day    ACCU-CHEK GUIDE TEST STRIPS Does not apply Strip    Twice daily    ACCU-CHEK SOFTCLIX LANCETS Misc    Apply topically Twice daily    acetaminophen  (TYLENOL ) 500 mg Oral Tablet    Take 2 Tablets (1,000 mg total) by mouth Every 6 hours as needed for Pain    aspirin  (ECOTRIN) 81 mg Oral Tablet, Delayed Release (E.C.)    Take 1 Tablet (81 mg total) by mouth Daily    BD UF MINI PEN NEEDLE 31 gauge x 3/16 Needle    SMARTSIG:SUB-Q    empagliflozin (JARDIANCE) 25 mg Oral Tablet    Take 1 Tablet (25 mg total) by mouth Once a day    ergocalciferol, vitamin D2, (DRISDOL) 1,250 mcg (50,000 unit) Oral Capsule     Take 1 Capsule (50,000 Units total) by mouth Every 7 days    flash glucose scanning reader (FREESTYLE LIBRE 14 DAY READER) Does not apply Misc    USE TO CHECK GLUCOSE AS DIRECTED 4 TIMES DAILY    fluticasone propionate (FLONASE) 50 mcg/actuation Nasal Spray, Suspension    Administer 1 Spray into each nostril Once per day as needed    gabapentin (NEURONTIN) 100 mg Oral Capsule    1 Capsule (100 mg total) Three times a day    gabapentin (NEURONTIN) 800 mg Oral Tablet    Take 1 Tablet (800 mg total) by mouth Three times a day    hydrOXYzine HCL (ATARAX) 50 mg Oral Tablet    Take 1 Tablet (50 mg total) by mouth Four times a day as needed    Insulin  Syringe-Needle U-100 0.3 mL 29 gauge x 1/2 Syringe    Daily  methocarbamoL  (ROBAXIN ) 500 mg Oral Tablet    Take 1 Tablet (500 mg total) by mouth Four times a day as needed for Other (Muscle spasms) for up to 5 days    naproxen  (NAPROSYN ) 500 mg Oral Tablet    Take 1 Tablet (500 mg total) by mouth Twice daily with food    NURTEC ODT 75 mg Oral Tablet, Rapid Dissolve    Place 1 Tablet (75 mg total) under the tongue Every 24 hours as needed    ondansetron  (ZOFRAN  ODT) 4 mg Oral Tablet, Rapid Dissolve    Take 1 Tablet (4 mg total) by mouth Four times a day as needed    ramipriL (ALTACE) 10 mg Oral Capsule    Take 1 Capsule (10 mg total) by mouth Once a day    simvastatin (ZOCOR) 10 mg Oral Tablet    SMARTSIG:1 Tablet(s) By Mouth Every Evening    TOUJEO  MAX U-300 SOLOSTAR 300 unit/mL (3 mL) Subcutaneous Insulin  Pen    Inject 55 Units under the skin Once a day    VENTOLIN  HFA 90 mcg/actuation Inhalation oral inhaler    Take 2 Puffs by inhalation Every 4 hours as needed                   PHYSICAL EXAM:  Filed Vitals:    04/01/24 0030 04/01/24 0045 04/01/24 0100 04/01/24 0449   BP:  110/70 107/66 131/74   Pulse:    88   Resp:    18   Temp:    36.6 C (97.9 F)   SpO2: 95% 95% 95% 95%           VITALS:    BP 131/74   Pulse 88   Temp 36.6 C (97.9 F)   Resp 18   Ht 1.575 m (5'  2)   Wt 51.7 kg (114 lb)   SpO2 95%   BMI 20.85 kg/m     Physical Exam  Constitutional:       Appearance: Normal appearance. She is not ill-appearing.   HENT:      Head: Normocephalic and atraumatic.   Eyes:      Extraocular Movements: Extraocular movements intact.      Pupils: Pupils are equal, round, and reactive to light.   Cardiovascular:      Rate and Rhythm: Normal rate and regular rhythm.      Pulses: Normal pulses.      Heart sounds: Normal heart sounds. No murmur heard.  Pulmonary:      Effort: Pulmonary effort is normal. No respiratory distress.   Abdominal:      General: There is no distension.      Palpations: Abdomen is soft.      Tenderness: There is no abdominal tenderness.   Musculoskeletal:         General: No tenderness or signs of injury.      Right lower leg: No edema.      Left lower leg: No edema.   Neurological:      General: No focal deficit present.      Mental Status: She is alert and oriented to person, place, and time.      Cranial Nerves: No cranial nerve deficit.      Motor: No weakness.                  DIAGNOSTIC STUDIES:     Recent Labs     03/31/24  1907   WBC 9.7  HGB 15.6*   HCT 44.7*   PLTCNT 227     Recent Labs     03/31/24  1907   PMNS 81*   LYMPHOCYTES 14*   MONOCYTES 5   EOSINOPHIL 0*   BASOPHILS 0  0.04   PMNABS 7.79   LYMPHSABS 1.34   EOSABS 0.05   MONOSABS 0.45     Recent Labs     03/31/24  1907 04/01/24  0008   SODIUM 135* 137   POTASSIUM 4.3 3.4*   CHLORIDE 101 105   CO2 18* 19*   BUN 20* 18   CREATININE 0.79 0.58   GLUCOSENF 268* 152*   ANIONGAP 16* 13   BUNCRRATIO 25 31   GFR 88 106     Recent Labs     03/31/24  1907   AST 24   ALT 23   ALKPHOS 128*   TOTBILIRUBIN 0.7   TOTALPROTEIN 8.3*   ALBUMIN 3.7     Recent Labs     03/31/24  1907 03/31/24  1948   LIPASE 22  --    UROBILINOGEN  --  0.2     No results for input(s): PROTHROMTME, INR, APTT, DDIMERQUANT, FIBRINOGEN in the last 72 hours.  No results for input(s): CREAPROINFLA, ESR in the last 72  hours.        Results for orders placed or performed during the hospital encounter of 03/31/24 (from the past 24 hours)   ECG 12 LEAD   Result Value Ref Range    Ventricular rate 92 BPM    Atrial Rate 92 BPM    PR Interval 162 ms    QRS Duration 78 ms    QT Interval 380 ms    QTC Calculation 469 ms    Calculated P Axis 60 degrees    Calculated R Axis 51 degrees    Calculated T Axis 62 degrees   BLOOD GAS W/ LACTATE REFLEX Venous   Result Value Ref Range    %FIO2 (VENOUS) 21.0 %    PH (VENOUS) 7.29 (L) 7.32 - 7.43    PCO2 (VENOUS) 33 (L) 41 - 51 mm/Hg    PO2 (VENOUS) 41 35 - 50 mm/Hg    BICARBONATE (VENOUS) 16.7 (L) 22.0 - 29.0 mmol/L    BASE DEFICIT 9.6 (H) 0.0 - 3.0 mmol/L    O2 SATURATION (VENOUS) 69.2 40.0 - 85.0 %    LACTATE 2.4 (H) <=1.9 mmol/L   COMPREHENSIVE METABOLIC PANEL, NON-FASTING   Result Value Ref Range    SODIUM 135 (L) 136 - 145 mmol/L    POTASSIUM 4.3 3.5 - 5.1 mmol/L    CHLORIDE 101 98 - 107 mmol/L    CO2 TOTAL 18 (L) 21 - 32 mmol/L    ANION GAP 16 (H) 4 - 13 mmol/L    BUN 20 (H) 7 - 18 mg/dL    CREATININE 1.61 0.96 - 1.02 mg/dL    BUN/CREA RATIO 25     ESTIMATED GFR 88 >59 mL/min/1.70m^2    ALBUMIN 3.7 3.4 - 5.0 g/dL    CALCIUM 9.2 8.5 - 04.5 mg/dL    GLUCOSE 409 (H) 74 - 106 mg/dL    ALKALINE PHOSPHATASE 128 (H) 46 - 116 U/L    ALT (SGPT) 23 <=78 U/L    AST (SGOT) 24 15 - 37 U/L    BILIRUBIN TOTAL 0.7 0.2 - 1.0 mg/dL    PROTEIN TOTAL 8.3 (H) 6.4 - 8.2 g/dL    ALBUMIN/GLOBULIN RATIO 0.8 0.8 -  1.4    OSMOLALITY, CALCULATED 282 270 - 290 mOsm/kg    CALCIUM, CORRECTED 9.4 mg/dL    GLOBULIN 4.6    LIPASE   Result Value Ref Range    LIPASE 22 16 - 77 U/L   MAGNESIUM    Result Value Ref Range    MAGNESIUM  1.8 1.8 - 2.4 mg/dL   TROPONIN-I   Result Value Ref Range    TROPONIN I 5 <15 ng/L   ACETONE [BLF ONLY]   Result Value Ref Range    ACETONE Small (A) Negative   CBC WITH DIFF   Result Value Ref Range    WBC 9.7 3.8 - 11.8 x10^3/uL    RBC 4.76 3.63 - 4.92 x10^6/uL    HGB 15.6 (H) 10.9 - 14.3 g/dL    HCT  29.5 (H) 62.1 - 41.9 %    MCV 94.0 75.5 - 95.3 fL    MCH 32.7 24.7 - 32.8 pg    MCHC 34.9 32.3 - 35.6 g/dL    RDW 30.8 65.7 - 84.6 %    PLATELETS 227 140 - 440 x10^3/uL    MPV 8.1 7.9 - 10.8 fL    NEUTROPHIL % 81 (H) 43 - 77 %    LYMPHOCYTE % 14 (L) 16 - 46 %    MONOCYTE % 5 4 - 11 %    EOSINOPHIL % 0 (L) 1 - 7 %    BASOPHIL % 0 0 - 1 %    NEUTROPHIL # 7.79 1.90 - 8.20 x10^3/uL    LYMPHOCYTE # 1.34 1.10 - 3.10 x10^3/uL    MONOCYTE # 0.45 0.20 - 0.90 x10^3/uL    EOSINOPHIL # 0.05 0.00 - 0.50 x10^3/uL    BASOPHIL # 0.04 0.00 - 0.10 x10^3/uL   URINALYSIS, MACRO/MICRO   Result Value Ref Range    COLOR Light Yellow Light Yellow, Yellow    APPEARANCE Clear Clear    SPECIFIC GRAVITY 1.020 1.003 - 1.035    PH 6.0 4.6 - 8.0    LEUKOCYTES Negative Negative WBCs/uL    NITRITE Negative Negative    PROTEIN Negative Negative mg/dL    GLUCOSE >=9629 (A) Negative mg/dL    KETONES >=52 (A) Negative mg/dL    BILIRUBIN Negative Negative mg/dL    BLOOD Negative Negative mg/dL    UROBILINOGEN 0.2 0.2 - 1.0 mg/dL   OCCULT BLOOD ([facility] ED USE ONLY)    Specimen: Stool   Result Value Ref Range    OCCULT BLOOD Negative Negative   POC BLOOD GLUCOSE (RESULTS)   Result Value Ref Range    GLUCOSE, POC 218 (H) 70 - 100 mg/dl   POC BLOOD GLUCOSE (RESULTS)   Result Value Ref Range    GLUCOSE, POC 198 (H) 70 - 100 mg/dl   LACTIC ACID - FIRST REFLEX   Result Value Ref Range    LACTIC ACID 1.6 0.4 - 2.0 mmol/L   POC BLOOD GLUCOSE (RESULTS)   Result Value Ref Range    GLUCOSE, POC 184 (H) 70 - 100 mg/dl   POC BLOOD GLUCOSE (RESULTS)   Result Value Ref Range    GLUCOSE, POC 153 (H) 70 - 100 mg/dl   POC BLOOD GLUCOSE (RESULTS)   Result Value Ref Range    GLUCOSE, POC 151 (H) 70 - 100 mg/dl   BASIC METABOLIC PANEL   Result Value Ref Range    SODIUM 137 136 - 145 mmol/L    POTASSIUM 3.4 (L) 3.5 - 5.1 mmol/L  CHLORIDE 105 98 - 107 mmol/L    CO2 TOTAL 19 (L) 21 - 32 mmol/L    ANION GAP 13 4 - 13 mmol/L    CALCIUM 8.4 (L) 8.5 - 10.1 mg/dL    GLUCOSE 332 (H)  74 - 106 mg/dL    BUN 18 7 - 18 mg/dL    CREATININE 9.51 8.84 - 1.02 mg/dL    BUN/CREA RATIO 31     ESTIMATED GFR 106 >59 mL/min/1.62m^2    OSMOLALITY, CALCULATED 279 270 - 290 mOsm/kg   POC BLOOD GLUCOSE (RESULTS)   Result Value Ref Range    GLUCOSE, POC 120 (H) 70 - 100 mg/dl   POC BLOOD GLUCOSE (RESULTS)   Result Value Ref Range    GLUCOSE, POC 146 (H) 70 - 100 mg/dl                   Imaging Studies:    Results for orders placed or performed during the hospital encounter of 03/31/24   CT ABDOMEN PELVIS WO IV CONTRAST     Status: None    Narrative    Odaliz L Figueira    RADIOLOGIST: Kelby Frame    CT ABDOMEN PELVIS WO IV CONTRAST performed on 03/31/2024 7:40 PM    CLINICAL HISTORY: upper abd pain burning nv.  N/V, PT STATES SHE HAD BLOOD IN VOMIT, HX OF GB REMOVAL/CSECTION    TECHNIQUE:  Abdomen and pelvis CT without intravenous contrast.    COMPARISON:  March 16, 2023 CT scan    FINDINGS:  Noncontrast technique limits evaluation of the abdominal and pelvic viscera.    Lung bases: Anteriorly over the lower xiphoid area there is a questionable small lipoma versus asymmetric fat in this area. There is no consolidation in the lower chest or acute pleural disease identified.    Liver:   Overall stable contour, caliber, and density of the liver. Scattered calcifications are nonspecific.    Gallbladder:   Surgically absent    Spleen:   Unremarkable.    Pancreas:   No definite CT evidence of acute pancreatitis, correlate with lipase testing if clinically concern for this diagnosis    Adrenals:   Stable    Kidneys:   No obstructing stones or hydronephrosis. 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 Similar to the prior study    Bladder:  Unremarkable.    Uterus and Adnexa:  Unremarkable.    Bowel:   Assessment of the gastric wall is suboptimal due to incomplete distention. Small amount of fluid at the distal esophagus could indicate reflux. Correlate for  symptoms of reflux esophagitis. No dilated small bowel loops to indicate mechanical obstruction. Scattered diverticula are seen involving the colon. Mild to moderate colonic stool retention. No significant focal pericolonic mesenteric inflammatory disease    Appendix:  Normal.    Lymph nodes:  No suspicious lymph node enlargement.    Vasculature:   Scattered calcified wall plaque at the aorta and major branches, no aortic aneurysm     Peritoneum / Retroperitoneum: No ascites.  No free air.    Bones:   Degenerative changes of the spine with similar vertebral contours compared to the prior exam          Impression    No mesenteric inflammation, free fluid or free air  Limited gastric wall assessment due to incomplete distention. Possible reflux in the distal esophagus. Consider endoscopy if continued clinical concern  for this area.  Incidental findings as detailed above      Radiologist location ID: [id number]     XR CHEST AP     Status: None    Narrative    Saraann L Kelter    RADIOLOGIST: Lanie Pitcairn    XR CHEST AP performed on 03/31/2024 7:18 PM    CLINICAL HISTORY: cardiac eval  VOMITTING. HX OF BILATERAL BREAST CANCER WITH BILATERAL MASTECTOMYS.    TECHNIQUE: Frontal view of the chest.    COMPARISON:  01/04/2024    FINDINGS:    Cardiac and mediastinal contours are stable.   There are chronic-appearing changes of both lungs.           Impression    NO ACUTE FINDINGS.        Radiologist location ID: [id number]                      ASSESSMENT AND PLAN:  Gail Morris, 58 y.o. female      IPRX  No outpatient medications have been marked as taking for the 03/31/24 encounter [facility] Encounter).      acetaminophen  (TYLENOL ) tablet, 650 mg, Oral, Q4H PRN  aluminum -magnesium  hydroxide-simethicone  (MAG-AL PLUS) 200-200-20 mg per 5 mL oral liquid, 30 mL, Oral, Q4H PRN  Correction/SSIP insulin  lispro 100 units/mL injection, 3-14 Units, Subcutaneous, 4x/day AC  D5W NS premix infusion, , Intravenous,  Continuous  dextrose  (GLUTOSE) 40% oral gel, 15 g, Oral, Q15 Min PRN  dextrose  50% (0.5 g/mL) injection - syringe, 12.5 g, Intravenous, Q15 Min PRN  docusate sodium (COLACE) capsule, 100 mg, Oral, 2x/day PRN  [START ON 04/02/2024] enoxaparin  PF (LOVENOX ) 40 mg/0.4 mL SubQ injection, 40 mg, Subcutaneous, Daily  folic acid (FOLVITE) tablet, 1 mg, Oral, Daily  glucagon  injection 1 mg, 1 mg, IntraMUSCULAR, Once PRN  insulin  glargine 100 units/mL injection, 10 Units, Subcutaneous, NIGHTLY  insulin  regular human 100 Units in NS 100 mL infusion, 0-20 Units/hr, Intravenous, Continuous  multivitamin (THERA) tablet, 1 Tablet, Oral, Daily  nicotine  (NICODERM CQ ) transdermal patch (mg/24 hr), 21 mg, Transdermal, Now  NS flush syringe, 3 mL, Intracatheter, Q8HRS  NS flush syringe, 3 mL, Intracatheter, Q1H PRN  ondansetron  (ZOFRAN ) 2 mg/mL injection, 4 mg, Intravenous, Q6H PRN           Problem List  There are no active hospital problems to display for this patient.    ---------------------      ---------------  Problem List[3]         AP   DKA - patient came to the hospital was a admitted and planned for DKA however the patient was on insulin  drip and received fluids and over time improved to the point where she was no longer considered in DKA and patient then transition to floor status.  Continue IV fluid and sliding scale insulin .  Atrial fibrillation - patient temporarily had an episode of atrial fibrillation we will continue to monitor for now.  Dental extraction - monitor patient's symptoms, does not appear to be infectious at this time.             DIET: DIET NO CONCENTRATED SWEETS Do you want to initiate MNT Protocol? Yes; Calorie amount: CC 1800  DVT PROPHYLAXIS:   CODE/DNR STATUS:    FULL CODE: ATTEMPT RESUSCITATION/CPR         Disposition:    Based on estimated stay less or more than 48 hours based on this to obtain full medical treatment the current status  of this visit on admission  []  Observation Status  []  Admission  Inpatient    The patient is currently acutely ill requiring treatment on the medical floor for    <principal problem not specified>    <principal problem not specified>  No admission diagnoses are documented for this encounter.         Patient will be closely evaluated monitor and remove be adjusted accordingly.          Arlena Belts, MD     [place] MEDICINE HOSPITALIST     [facility]     Contents of the document, in whole or in part, are completed utilizing voice dictation technology, please forgive any typographical errors that may exist.                         [1]   Social History  Tobacco Use    Smoking status: Every Day     Current packs/day: 1.00     Types: Cigarettes    Smokeless tobacco: Never   Vaping Use    Vaping status: Never Used   Substance Use Topics    Alcohol use: Never    Drug use: Yes     Types: Marijuana     Comment: USED AROUND 3 DAYS AGO   [2]   Allergies  Allergen Reactions    Dulaglutide  Other Adverse Reaction (Add comment) and Diarrhea     Loss of appetite    Metformin  Other Adverse Reaction (Add comment) and Diarrhea     Dehydrates me    Tramadol  Other Adverse Reaction (Add comment)     Tachycardia    Other Reaction(s): Angioedema, Not available, tachycardia   [3]   Patient Active Problem List  Diagnosis    Hypotension    Acute low back pain    Acute pain of right shoulder    Chest pain    Tobacco abuse

## 2024-04-01 NOTE — ED Notes (Signed)
 [facility] - Emergency Department  Peer Recovery Coach Assessment    Initial Evaluation  Referred by:: Nurse  Location of Evaluation: Emergency Department  How many times in the last 12 months have you been to the ED?: 6 or more  Have you ever served or are you currently serving in the Armed Forces?: No             Substance Use History  Patient current substance use status: Patient states she smokes marijuana2 to 4 times a week and has been since the age of 9. Patient says it helps with her pain and anxiety.    Prior treatment history?: No    Currently enrolled in substance use program?: No    Within the last 30 days, what substances has the patient used?: Marijuana  Patient's age at first substance use?: 55 or older  Drug route of administration: Smoked    Has the patient ever had sustained abstinence?: No              Family, Social, Home & Safety History  Marital Status: Single    Number of children: 2       Need to improve relationships with family?: No    Social network: Immediate family, Substance using peers, Non-substance using peers/friends/other    Current living situation: Independent  Any help needed with the following?: None  Contact phone number for the patient: [phone]       Has the patient had any legal issues within the past 30 days?: None         Employment  Current employment status: Disabled    Needs vocational training?: No  Needs assistance with job search?: No    Engagement  Readiness ruler: 1  Summary of assessment priority areas: Other    Brief Intervention  Discussed plan to reduce/quit substance use?: Yes  Discussed willingness to enter treatment?: Yes  Indicated patient's stage of change:: 1 - Precontemplation    Patient seen by Peer Recovery Coach and is a candidate for buprenorphine  administration in the ED. Patient needs assessment for bup treatment.: No (non opiod user)    Plan  Was the patient referred to treatment?: No    Was patient referred to  physician for Buprenorphine  Assessment in the ED?: No    Did patient receive Narcan in the ED?: No    Plan: Additional Comments: Safe harm and long term affects on the body was discussed    Follow-up  Patient admitted for treatment?: No        Need for additional follow-up?: No       Charls Cooks, Peer Recovery Coach 04/01/2024 11:16

## 2024-04-01 NOTE — ED Nurses Note (Signed)
 Pt to be transported to [facility] by [facility]. Report called to [place] at [facility]. Pt transported to [facility] by stretcher at this time.

## 2024-04-01 NOTE — Care Plan (Signed)
**Note De-Identified Gail Morris Obfuscation** Admitted this shift for DKA. Alert and orientated to the room and floor. Lab monitoring continues. Telemetry in use. Fluids infusing as ordered. Fall risk policy in place for the patients safety. Bed alarm engaged. Call bell in reach. Transition readiness ongoing.
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
## 2024-04-01 NOTE — Progress Notes (Signed)
 [facility]   Progress Note    Gail Morris  Date of service: 04/01/2024  Date of Admission:  03/31/2024  Hospital Day:  LOS: 1 day       CHIEF COMPLAINT:   Vomiting (Ems reports pt had  5 teeth pulled 2 days ago, nausea and vomiting since yest. Pt noticed some blood in vomitus. Blood sugar 321, hx DM, taking Lantus  at home. IV 22 ga left forearm, normal saline 400 ml  infused in route. Ems gave Zofran  4 mg ODT in route . Ems reports pt had atrial flutter 114 but then sinus tach on monitor. Pt has not been able to feel the amoxil  rx )     HPI:   Gail Morris 58 y.o. female          Patient states that a few days ago she had her teeth pulled and ever since the procedure she has had nausea vomiting.  She claims that she has had some dry heaves, even though initially she had a little bit of blood mixed with the vomit.  She is not feeling well at all and decided to come to [facility] the ER to be evaluated.  She denies any fever chest pain abdominal pain.  Patient here to [facility] was found to have elevated beta hydroxybutyrate and slightly elevated anion gap placed on an insulin  drip there and patient improves the point where anion gap closed.  She was sent here for continued and me        Patient admitted overnight after having a episode of blood in vomitus.  On admission she was found to have DKA which quickly resolved with insulin  drip, she was transitioned to subcu insulin  and admitted to tele floor.      Subjective:  Seen and examined at bedside, eating okay, feeling better hemodynamically stable    Review of Systems:    Review of Systems    Objective:      Vital Signs:  Vitals:    04/01/24 0539 04/01/24 0746 04/01/24 0954 04/01/24 1130   BP:  128/74  (!) 153/83   Pulse:  83 77 82   Resp:  18  18   Temp:  36.5 C (97.7 F)  36.4 C (97.5 F)   SpO2:  96%  94%   Weight: 51.7 kg (114 lb)      Height: 1.575 m (5' 2.01)      BMI: 20.85               I/O:  I/O last 24 hours:    Intake/Output  Summary (Last 24 hours) at 04/01/2024 1238  Last data filed at 04/01/2024 1124  Gross per 24 hour   Intake 4438 ml   Output --   Net 4438 ml         acetaminophen  (TYLENOL ) tablet, 650 mg, Oral, Q4H PRN  aluminum -magnesium  hydroxide-simethicone  (MAG-AL PLUS) 200-200-20 mg per 5 mL oral liquid, 30 mL, Oral, Q4H PRN  aspirin  chewable tablet 81 mg, 81 mg, Oral, Daily  Correction/SSIP insulin  lispro 100 units/mL injection, 3-14 Units, Subcutaneous, 4x/day AC  dextrose  (GLUTOSE) 40% oral gel, 15 g, Oral, Q15 Min PRN  dextrose  50% (0.5 g/mL) injection - syringe, 12.5 g, Intravenous, Q15 Min PRN  docusate sodium (COLACE) capsule, 100 mg, Oral, 2x/day PRN  [START ON 04/02/2024] enoxaparin  PF (LOVENOX ) 40 mg/0.4 mL SubQ injection, 40 mg, Subcutaneous, Daily  folic acid (FOLVITE) tablet, 1 mg, Oral, Daily  gabapentin (NEURONTIN) capsule, 800 mg,  Oral, 3x/day  glucagon  injection 1 mg, 1 mg, IntraMUSCULAR, Once PRN  insulin  glargine 100 units/mL injection, 10 Units, Subcutaneous, NIGHTLY  LR premix infusion, , Intravenous, Continuous  multivitamin (THERA) tablet, 1 Tablet, Oral, Daily  nicotine  (NICODERM CQ ) transdermal patch (mg/24 hr), 21 mg, Transdermal, Now  NS flush syringe, 3 mL, Intracatheter, Q8HRS  NS flush syringe, 3 mL, Intracatheter, Q1H PRN  ondansetron  (ZOFRAN ) 2 mg/mL injection, 4 mg, Intravenous, Q6H PRN  potassium & sodium phosphate  (K PHOS NEUTRAL) tablet, 250 mg, Oral, 4x/day PC        Physical Exam:    Physical Exam    Labs:  Results for orders placed or performed during the hospital encounter of 03/31/24 (from the past 24 hours)   BLOOD GAS W/ LACTATE REFLEX Venous    Collection Time: 03/31/24  6:55 PM   Result Value Ref Range    %FIO2 (VENOUS) 21.0 %    PH (VENOUS) 7.29 (L) 7.32 - 7.43    PCO2 (VENOUS) 33 (L) 41 - 51 mm/Hg    PO2 (VENOUS) 41 35 - 50 mm/Hg    BICARBONATE (VENOUS) 16.7 (L) 22.0 - 29.0 mmol/L    BASE DEFICIT 9.6 (H) 0.0 - 3.0 mmol/L    O2 SATURATION (VENOUS) 69.2 40.0 - 85.0 %    LACTATE 2.4 (H)  <=1.9 mmol/L    Narrative    VBG DRAWN BY RN  ROOM AIR  Manufacturer does not recommend venous sample for assessment of patient oxygenation status. A reference range for pO2, Oxyhemoglobin and Oxygen Saturation is provided but abnormal results will not flag in Epic.   CBC/DIFF    Collection Time: 03/31/24  7:07 PM    Narrative    The following orders were created for panel order CBC/DIFF.  Procedure                               Abnormality         Status                     ---------                               -----------         ------                     CBC WITH DIFF[[id number]]                Abnormal            Final result                 Please view results for these tests on the individual orders.   COMPREHENSIVE METABOLIC PANEL, NON-FASTING    Collection Time: 03/31/24  7:07 PM   Result Value Ref Range    SODIUM 135 (L) 136 - 145 mmol/L    POTASSIUM 4.3 3.5 - 5.1 mmol/L    CHLORIDE 101 98 - 107 mmol/L    CO2 TOTAL 18 (L) 21 - 32 mmol/L    ANION GAP 16 (H) 4 - 13 mmol/L    BUN 20 (H) 7 - 18 mg/dL    CREATININE 6.38 7.56 - 1.02 mg/dL    BUN/CREA RATIO 25     ESTIMATED GFR 88 >59 mL/min/1.27m^2    ALBUMIN 3.7 3.4 - 5.0 g/dL  CALCIUM 9.2 8.5 - 10.1 mg/dL    GLUCOSE 161 (H) 74 - 106 mg/dL    ALKALINE PHOSPHATASE 128 (H) 46 - 116 U/L    ALT (SGPT) 23 <=78 U/L    AST (SGOT) 24 15 - 37 U/L    BILIRUBIN TOTAL 0.7 0.2 - 1.0 mg/dL    PROTEIN TOTAL 8.3 (H) 6.4 - 8.2 g/dL    ALBUMIN/GLOBULIN RATIO 0.8 0.8 - 1.4    OSMOLALITY, CALCULATED 282 270 - 290 mOsm/kg    CALCIUM, CORRECTED 9.4 mg/dL    GLOBULIN 4.6     Narrative    Estimated Glomerular Filtration Rate (eGFR) is calculated using the CKD-EPI (2021) equation, intended for patients 47 years of age and older. If gender is not documented or unknown, there will be no eGFR calculation.   LIPASE    Collection Time: 03/31/24  7:07 PM   Result Value Ref Range    LIPASE 22 16 - 77 U/L   MAGNESIUM     Collection Time: 03/31/24  7:07 PM   Result Value Ref Range     MAGNESIUM  1.8 1.8 - 2.4 mg/dL   TROPONIN-I    Collection Time: 03/31/24  7:07 PM   Result Value Ref Range    TROPONIN I 5 <15 ng/L    Narrative    Values received on females ranging between 12-15 ng/L MUST include the next serial troponin to review changes in the delta differences as the reference range for the Access II chemistry analyzer is lower than the established reference range.     ACETONE [BLF ONLY]    Collection Time: 03/31/24  7:07 PM   Result Value Ref Range    ACETONE Small (A) Negative   CBC WITH DIFF    Collection Time: 03/31/24  7:07 PM   Result Value Ref Range    WBC 9.7 3.8 - 11.8 x10^3/uL    RBC 4.76 3.63 - 4.92 x10^6/uL    HGB 15.6 (H) 10.9 - 14.3 g/dL    HCT 09.6 (H) 04.5 - 41.9 %    MCV 94.0 75.5 - 95.3 fL    MCH 32.7 24.7 - 32.8 pg    MCHC 34.9 32.3 - 35.6 g/dL    RDW 40.9 81.1 - 91.4 %    PLATELETS 227 140 - 440 x10^3/uL    MPV 8.1 7.9 - 10.8 fL    NEUTROPHIL % 81 (H) 43 - 77 %    LYMPHOCYTE % 14 (L) 16 - 46 %    MONOCYTE % 5 4 - 11 %    EOSINOPHIL % 0 (L) 1 - 7 %    BASOPHIL % 0 0 - 1 %    NEUTROPHIL # 7.79 1.90 - 8.20 x10^3/uL    LYMPHOCYTE # 1.34 1.10 - 3.10 x10^3/uL    MONOCYTE # 0.45 0.20 - 0.90 x10^3/uL    EOSINOPHIL # 0.05 0.00 - 0.50 x10^3/uL    BASOPHIL # 0.04 0.00 - 0.10 x10^3/uL   URINALYSIS WITH REFLEX MICROSCOPIC AND CULTURE IF POSITIVE    Collection Time: 03/31/24  7:48 PM    Specimen: Urine, Clean Catch    Narrative    The following orders were created for panel order URINALYSIS WITH REFLEX MICROSCOPIC AND CULTURE IF POSITIVE.  Procedure                               Abnormality         Status                     ---------                               -----------         ------  URINALYSIS, MACRO/MICRO[[id number]]      Abnormal            Final result                 Please view results for these tests on the individual orders.   URINALYSIS, MACRO/MICRO    Collection Time: 03/31/24  7:48 PM   Result Value Ref Range    COLOR Light Yellow Light Yellow, Yellow     APPEARANCE Clear Clear    SPECIFIC GRAVITY 1.020 1.003 - 1.035    PH 6.0 4.6 - 8.0    LEUKOCYTES Negative Negative WBCs/uL    NITRITE Negative Negative    PROTEIN Negative Negative mg/dL    GLUCOSE >=3875 (A) Negative mg/dL    KETONES >=64 (A) Negative mg/dL    BILIRUBIN Negative Negative mg/dL    BLOOD Negative Negative mg/dL    UROBILINOGEN 0.2 0.2 - 1.0 mg/dL   OCCULT BLOOD (PRN ED USE ONLY)    Collection Time: 03/31/24  7:49 PM    Specimen: Stool   Result Value Ref Range    OCCULT BLOOD Negative Negative   POC BLOOD GLUCOSE (RESULTS)    Collection Time: 03/31/24  7:51 PM   Result Value Ref Range    GLUCOSE, POC 218 (H) 70 - 100 mg/dl   POC BLOOD GLUCOSE (RESULTS)    Collection Time: 03/31/24  8:48 PM   Result Value Ref Range    GLUCOSE, POC 198 (H) 70 - 100 mg/dl   LACTIC ACID - FIRST REFLEX    Collection Time: 03/31/24  8:53 PM   Result Value Ref Range    LACTIC ACID 1.6 0.4 - 2.0 mmol/L   POC BLOOD GLUCOSE (RESULTS)    Collection Time: 03/31/24  9:53 PM   Result Value Ref Range    GLUCOSE, POC 184 (H) 70 - 100 mg/dl   POC BLOOD GLUCOSE (RESULTS)    Collection Time: 03/31/24 11:12 PM   Result Value Ref Range    GLUCOSE, POC 153 (H) 70 - 100 mg/dl   POC BLOOD GLUCOSE (RESULTS)    Collection Time: 04/01/24 12:07 AM   Result Value Ref Range    GLUCOSE, POC 151 (H) 70 - 100 mg/dl   BASIC METABOLIC PANEL    Collection Time: 04/01/24 12:08 AM   Result Value Ref Range    SODIUM 137 136 - 145 mmol/L    POTASSIUM 3.4 (L) 3.5 - 5.1 mmol/L    CHLORIDE 105 98 - 107 mmol/L    CO2 TOTAL 19 (L) 21 - 32 mmol/L    ANION GAP 13 4 - 13 mmol/L    CALCIUM 8.4 (L) 8.5 - 10.1 mg/dL    GLUCOSE 332 (H) 74 - 106 mg/dL    BUN 18 7 - 18 mg/dL    CREATININE 9.51 8.84 - 1.02 mg/dL    BUN/CREA RATIO 31     ESTIMATED GFR 106 >59 mL/min/1.80m^2    OSMOLALITY, CALCULATED 279 270 - 290 mOsm/kg    Narrative    Estimated Glomerular Filtration Rate (eGFR) is calculated using the CKD-EPI (2021) equation, intended for patients 61 years of age and  older. If gender is not documented or unknown, there will be no eGFR calculation.   POC BLOOD GLUCOSE (RESULTS)    Collection Time: 04/01/24  1:44 AM   Result Value Ref Range    GLUCOSE, POC 120 (H) 70 - 100 mg/dl   POC BLOOD GLUCOSE (RESULTS)    Collection Time: 04/01/24  2:42 AM   Result Value Ref Range    GLUCOSE, POC 146 (H) 70 - 100 mg/dl   CBC/DIFF    Collection Time: 04/01/24  4:58 AM    Narrative    The following orders were created for panel order CBC/DIFF.  Procedure                               Abnormality         Status                     ---------                               -----------         ------                     CBC WITH DIFF[[id number]]                                                                 Please view results for these tests on the individual orders.   BASIC METABOLIC PANEL    Collection Time: 04/01/24  5:29 AM   Result Value Ref Range    SODIUM 136 136 - 145 mmol/L    POTASSIUM 3.6 3.5 - 5.1 mmol/L    CHLORIDE 110 (H) 98 - 107 mmol/L    CO2 TOTAL 17 (L) 21 - 31 mmol/L    ANION GAP 9 4 - 13 mmol/L    CALCIUM 8.3 (L) 8.6 - 10.3 mg/dL    GLUCOSE 440 (H) 74 - 109 mg/dL    BUN 16 7 - 25 mg/dL    CREATININE 3.47 (L) 0.60 - 1.30 mg/dL    BUN/CREA RATIO 41 (H) 6 - 22    ESTIMATED GFR 117 >59 mL/min/1.30m^2    OSMOLALITY, CALCULATED 280 270 - 290 mOsm/kg    Narrative    Estimated Glomerular Filtration Rate (eGFR) is calculated using the CKD-EPI (2021) equation, intended for patients 71 years of age and older. If gender is not documented or unknown, there will be no eGFR calculation.     HEPATIC FUNCTION PANEL    Collection Time: 04/01/24  5:29 AM   Result Value Ref Range    ALBUMIN 3.7 3.5 - 5.7 g/dL    ALKALINE PHOSPHATASE 78 34 - 104 U/L    ALT (SGPT) 10 7 - 52 U/L    AST (SGOT) 13 13 - 39 U/L    BILIRUBIN TOTAL 0.8 0.3 - 1.0 mg/dL    BILIRUBIN, INDIRECT 0.71 <=1 mg/dL    PROTEIN TOTAL 6.5 6.4 - 8.9 g/dL    GLOBULIN 2.8 2.0 - 3.5    ALBUMIN/GLOBULIN RATIO 1.3 0.8 - 1.4    BILIRUBIN  DIRECT 0.09 0.03 - 0.18 md/dL   MAGNESIUM     Collection Time: 04/01/24  5:29 AM   Result Value Ref Range    MAGNESIUM  1.7 (L) 1.9 - 2.7 mg/dL   PHOSPHORUS    Collection Time: 04/01/24  5:29 AM   Result Value Ref Range    PHOSPHORUS 2.0 (L) 3.7 - 7.2 mg/dL   LACTIC  ACID LEVEL W/ REFLEX FOR LEVEL >2.0    Collection Time: 04/01/24  5:29 AM   Result Value Ref Range    LACTIC ACID 0.5 0.5 - 2.2 mmol/L   B-TYPE NATRIURETIC PEPTIDE (BNP),PLASMA    Collection Time: 04/01/24  5:29 AM   Result Value Ref Range    BNP 116 (H) 1 - 100 pg/mL    Narrative                                Class 1: 101-250 pg/mL                              Class 2: 251-550 pg/mL                              Class 3: 551-900 pg/mL                              Class 4: >901 pg/mL     The [place]  [employer] has developed a four-stage functional classification system for CHF that is based on a subjective interpretation of the severity of a patient's clinical signs and symptoms.    Class 1 - Patients have no limitations on physical activity and have no symptoms with ordinary physical activity.    Class 2 - Patients have a slight limitation of physical activity and have symptoms with ordinary physical activity.    Class 3 - Patients have a marked limitation of physical activity and have symptoms with less than ordinary physical activity, but not at rest.    Class 4 - Patients are unable to perform any physical activity without discomfort.   TROPONIN-I    Collection Time: 04/01/24  5:29 AM   Result Value Ref Range    TROPONIN I 8 <15 ng/L   CREATINE KINASE (CK), TOTAL, SERUM OR PLASMA    Collection Time: 04/01/24  5:29 AM   Result Value Ref Range    CREATINE KINASE 43 30 - 223 U/L   BETA-HYDROXYBUTYRATE    Collection Time: 04/01/24  5:29 AM   Result Value Ref Range    BETA-HYDROXYBUTYRATE 0.93 (H) 0.02 - 0.27 mmol/L   BLOOD GAS Venous    Collection Time: 04/01/24  5:29 AM   Result Value Ref Range    %FIO2 (VENOUS) 21.0 %    PH (VENOUS) 7.49 (H) 7.32  - 7.43    PCO2 (VENOUS) 20 (L) 41 - 51 mm/Hg    PO2 (VENOUS) 202 35 - 50 mm/Hg    BICARBONATE (VENOUS) 20.5 (L) 22.0 - 29.0 mmol/L    BASE DEFICIT 5.8 (H) 0.0 - 3.0 mmol/L    O2 SATURATION (VENOUS) 99.8 40.0 - 85.0 %    Narrative    Manufacturer does not recommend venous sample for assessment of patient oxygenation status. A reference range for pO2, Oxyhemoglobin and Oxygen Saturation is provided but abnormal results will not flag in Epic.   EXTRA TUBES    Collection Time: 04/01/24  5:33 AM    Narrative    The following orders were created for panel order EXTRA TUBES.  Procedure  Abnormality         Status                     ---------                               -----------         ------                     BLUE TOP TUBE[[id number]]                                    Final result               GOLD TOP TUBE[[id number]]                                    Final result               LAVENDER TOP TUBE[[id number]]                                Final result               GRAY TOP TUBE[[id number]]                                    Final result                 Please view results for these tests on the individual orders.   BLUE TOP TUBE    Collection Time: 04/01/24  5:33 AM   Result Value Ref Range    RAINBOW/EXTRA TUBE AUTO RESULT Yes    GOLD TOP TUBE    Collection Time: 04/01/24  5:33 AM   Result Value Ref Range    RAINBOW/EXTRA TUBE AUTO RESULT Yes    LAVENDER TOP TUBE    Collection Time: 04/01/24  5:33 AM   Result Value Ref Range    RAINBOW/EXTRA TUBE AUTO RESULT Yes    GRAY TOP TUBE    Collection Time: 04/01/24  5:33 AM   Result Value Ref Range    RAINBOW/EXTRA TUBE AUTO RESULT Yes    POC BLOOD GLUCOSE (RESULTS)    Collection Time: 04/01/24  8:06 AM   Result Value Ref Range    GLUCOSE, POC 179 (H) 70 - 100 mg/dl   CBC/DIFF    Collection Time: 04/01/24  8:45 AM    Narrative    The following orders were created for panel order CBC/DIFF.  Procedure                               Abnormality          Status                     ---------                               -----------         ------  CBC WITH DIFF[[id number]]                Normal              Final result                 Please view results for these tests on the individual orders.   CBC WITH DIFF    Collection Time: 04/01/24  8:45 AM   Result Value Ref Range    WBC 9.0 3.8 - 11.8 x10^3/uL    RBC 4.46 3.63 - 4.92 x10^6/uL    HGB 14.1 10.9 - 14.3 g/dL    HCT 16.1 09.6 - 04.5 %    MCV 93.4 75.5 - 95.3 fL    MCH 31.7 24.7 - 32.8 pg    MCHC 34.0 32.3 - 35.6 g/dL    RDW 40.9 81.1 - 91.4 %    PLATELETS 230 140 - 440 x10^3/uL    MPV 7.9 7.9 - 10.8 fL    NEUTROPHIL % 64 43 - 77 %    LYMPHOCYTE % 26 16 - 46 %    MONOCYTE % 8 4 - 11 %    EOSINOPHIL % 1 1 - 7 %    BASOPHIL % 1 0 - 1 %    NEUTROPHIL # 5.80 1.90 - 8.20 x10^3/uL    LYMPHOCYTE # 2.30 1.10 - 3.10 x10^3/uL    MONOCYTE # 0.70 0.20 - 0.90 x10^3/uL    EOSINOPHIL # 0.10 0.00 - 0.50 x10^3/uL    BASOPHIL # 0.10 0.00 - 0.10 x10^3/uL   POC BLOOD GLUCOSE (RESULTS)    Collection Time: 04/01/24 10:45 AM   Result Value Ref Range    GLUCOSE, POC 186 (H) 70 - 100 mg/dl            Imaging:    Results for orders placed or performed during the hospital encounter of 03/31/24 (from the past 24 hours)   XR CHEST AP     Status: None    Narrative    Rachelann L Bascomb    RADIOLOGIST: Lanie Pitcairn    XR CHEST AP performed on 03/31/2024 7:18 PM    CLINICAL HISTORY: cardiac eval  VOMITTING. HX OF BILATERAL BREAST CANCER WITH BILATERAL MASTECTOMYS.    TECHNIQUE: Frontal view of the chest.    COMPARISON:  01/04/2024    FINDINGS:    Cardiac and mediastinal contours are stable.   There are chronic-appearing changes of both lungs.           Impression    NO ACUTE FINDINGS.        Radiologist location ID: [id number]     CT ABDOMEN PELVIS WO IV CONTRAST     Status: None    Narrative    Kiowa L Schwager    RADIOLOGIST: Kelby Frame    CT ABDOMEN PELVIS WO IV CONTRAST performed on 03/31/2024 7:40 PM    CLINICAL  HISTORY: upper abd pain burning nv.  N/V, PT STATES SHE HAD BLOOD IN VOMIT, HX OF GB REMOVAL/CSECTION    TECHNIQUE:  Abdomen and pelvis CT without intravenous contrast.    COMPARISON:  March 16, 2023 CT scan    FINDINGS:  Noncontrast technique limits evaluation of the abdominal and pelvic viscera.    Lung bases: Anteriorly over the lower xiphoid area there is a questionable small lipoma versus asymmetric fat in this area. There is no consolidation in the lower chest or acute pleural disease identified.    Liver:  Overall stable contour, caliber, and density of the liver. Scattered calcifications are nonspecific.    Gallbladder:   Surgically absent    Spleen:   Unremarkable.    Pancreas:   No definite CT evidence of acute pancreatitis, correlate with lipase testing if clinically concern for this diagnosis    Adrenals:   Stable    Kidneys:   No obstructing stones or hydronephrosis. 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 Similar to the prior study    Bladder:  Unremarkable.    Uterus and Adnexa:  Unremarkable.    Bowel:   Assessment of the gastric wall is suboptimal due to incomplete distention. Small amount of fluid at the distal esophagus could indicate reflux. Correlate for symptoms of reflux esophagitis. No dilated small bowel loops to indicate mechanical obstruction. Scattered diverticula are seen involving the colon. Mild to moderate colonic stool retention. No significant focal pericolonic mesenteric inflammatory disease    Appendix:  Normal.    Lymph nodes:  No suspicious lymph node enlargement.    Vasculature:   Scattered calcified wall plaque at the aorta and major branches, no aortic aneurysm     Peritoneum / Retroperitoneum: No ascites.  No free air.    Bones:   Degenerative changes of the spine with similar vertebral contours compared to the prior exam          Impression    No mesenteric inflammation, free fluid or free air  Limited  gastric wall assessment due to incomplete distention. Possible reflux in the distal esophagus. Consider endoscopy if continued clinical concern for this area.  Incidental findings as detailed above      Radiologist location ID: [id number]           Microbiology:  Hospital Encounter on 03/31/24 (from the past 96 hours)   OCCULT BLOOD (PRN ED USE ONLY)    Collection Time: 03/31/24  7:49 PM    Specimen: Stool   Culture Result Status    OCCULT BLOOD Negative Final           Assessment/ Plan:   Active Hospital Problems    Diagnosis    Primary Problem: DKA (diabetic ketoacidosis)        Nutrition:    DIET DIABETIC Calorie amount: CC 1800; Do you want to initiate MNT Protocol? Yes    Additional clinical characteristics related to nutrition:    - monitor for weight changes   - monitor intake and output    - monitor bowel functions          # DKA  - resolved  - repeat BMP  - Lantus   - lispro sliding scale  - IV fluids  - Diabetic diet    # Episode of Afib  - reported as per EMS  - here has been in sinus  - monitor on tele    # Dysphagia  # hematemesis  - consulted GI  - started protonix      Taft Fabry, MD    This note was partially generated using MModal Fluency Direct system, and there may be some incorrect words, spellings, and punctuation that were not noted in checking the note before saving.

## 2024-04-01 NOTE — Care Plan (Signed)
 Medical Nutrition Therapy Assessment    Reason for assessment: reduced intake    SUBJECTIVE : Patient has not been eating well since teeth recently pulled.  She needs very soft textures.  Patient had been nauseated earlier - was medicated with Zofran . No vomiting today.  Patient takes a long time to eat.     OBJECTIVE: Gluc 223    PMH includes:  DM    Current Diet Order/Nutrition Support:  DIET DIABETIC Calorie amount: CC 1800; Do you want to initiate MNT Protocol? Yes  DIET NPO - SPECIFIC DATE & TIME EXCEPT ALL MEDS WITH SIPS OF WATER                            Physical Assessment: weight adequate    Estimated Needs:  Energy Calorie Requirements: 1560  Protein Requirements (gms/day): 52       Comments: patient vomited blood on admission -for EGD tomorrow     Plan/Interventions : Consistent Carbohydrate meal plan  Level 5 textures    Follow up for any nutrition education needs    Goal: improved oral intake    Nutrition Diagnosis: Inadequate energy intake related to Situational  teeth pulled as evidenced by Decreased oral intake , Poor dentition    Rona Cobia, RDLD

## 2024-04-01 NOTE — ED Nurses Note (Signed)
 MD reports to keep drip turned off for now and he will review chart and given orders as needed.

## 2024-04-01 NOTE — Nurses Notes (Signed)
 Patients pain reassessed, patient stating pain is a 4 out of 10, she is comfortable. Pain intervention successful.

## 2024-04-01 NOTE — Care Plan (Signed)
 Problem: Wound  Goal: Optimal Coping  Outcome: Ongoing (see interventions/notes)  Goal: Optimal Functional Ability  Outcome: Ongoing (see interventions/notes)  Goal: Absence of Infection Signs and Symptoms  Outcome: Ongoing (see interventions/notes)  Goal: Improved Oral Intake  Outcome: Ongoing (see interventions/notes)  Goal: Optimal Pain Control and Function  Outcome: Ongoing (see interventions/notes)  Goal: Skin Health and Integrity  Outcome: Ongoing (see interventions/notes)  Intervention: Optimize Skin Protection  Recent Flowsheet Documentation  Taken 04/01/2024 1100 by Neldon Baltimore T, GN  Pressure Reduction Techniques: Frequent weight shifting encouraged  Pressure Reduction Devices:   Pressure redistributing mattress utilized   Repositioning wedges/pillows utilized  Goal: Optimal Wound Healing  Outcome: Ongoing (see interventions/notes)  Intervention: Promote Wound Healing  Recent Flowsheet Documentation  Taken 04/01/2024 1100 by Kelee Cunningham T, GN  Pressure Reduction Techniques: Frequent weight shifting encouraged  Pressure Reduction Devices:   Pressure redistributing mattress utilized   Repositioning wedges/pillows utilized     Problem: Adult Inpatient Plan of Care  Goal: Plan of Care Review  Outcome: Ongoing (see interventions/notes)  Goal: Patient-Specific Goal (Individualized)  Outcome: Ongoing (see interventions/notes)  Flowsheets (Taken 04/01/2024 0933)  Individualized Care Needs: Monitor labs and Vitals  Anxieties, Fears or Concerns: None voiced at this time  Goal: Absence of Hospital-Acquired Illness or Injury  Outcome: Ongoing (see interventions/notes)  Intervention: Identify and Manage Fall Risk  Recent Flowsheet Documentation  Taken 04/01/2024 0933 by Hildegard Low, GN  Safety Promotion/Fall Prevention:   fall prevention program maintained   safety round/check completed   nonskid shoes/slippers when out of bed  Intervention: Prevent Skin Injury  Recent Flowsheet Documentation  Taken 04/01/2024 1400 by Neldon Baltimore T,  GN  Body Position: supine  Taken 04/01/2024 1200 by Neldon Baltimore T, GN  Body Position: sitting  Taken 04/01/2024 1100 by Aveline Daus T, GN  Skin Protection: adhesive use limited  Taken 04/01/2024 0800 by Neldon Baltimore T, GN  Body Position: supine, head elevated  Intervention: Prevent and Manage VTE (Venous Thromboembolism) Risk  Recent Flowsheet Documentation  Taken 04/01/2024 0933 by Neldon Baltimore T, GN  Sequential Compression Device (SCDs): Sequential compression device off  VTE Prevention/Management: ambulation promoted  Taken 04/01/2024 0800 by Neldon Baltimore T, GN  Sequential Compression Device (SCDs): Sequential compression device off  Goal: Optimal Comfort and Wellbeing  Outcome: Ongoing (see interventions/notes)  Intervention: Provide Person-Centered Care  Recent Flowsheet Documentation  Taken 04/01/2024 0933 by Hildegard Low, GN  Trust Relationship/Rapport:   care explained   choices provided   thoughts/feelings acknowledged  Goal: Rounds/Family Conference  Outcome: Ongoing (see interventions/notes)     Problem: Health Knowledge, Opportunity to Enhance (Adult,Obstetrics,Pediatric)  Goal: Knowledgeable about Health Subject/Topic  Description: Patient will demonstrate the desired outcomes by discharge/transition of care.  Outcome: Ongoing (see interventions/notes)     Problem: Fall Injury Risk  Goal: Absence of Fall and Fall-Related Injury  Outcome: Ongoing (see interventions/notes)  Intervention: Promote Injury-Free Environment  Recent Flowsheet Documentation  Taken 04/01/2024 0933 by Hildegard Low, GN  Safety Promotion/Fall Prevention:   fall prevention program maintained   safety round/check completed   nonskid shoes/slippers when out of bed     Problem: Skin Injury Risk Increased  Goal: Skin Health and Integrity  Outcome: Ongoing (see interventions/notes)  Intervention: Optimize Skin Protection  Recent Flowsheet Documentation  Taken 04/01/2024 1100 by Jacquel Mccamish T, GN  Pressure Reduction Techniques: Frequent weight shifting encouraged  Pressure  Reduction Devices:   Pressure redistributing mattress utilized   Repositioning wedges/pillows utilized  Skin Protection: adhesive use limited     Problem: Depression  Goal: Decreased Depression Symptoms  Outcome: Ongoing (see interventions/notes)

## 2024-04-01 NOTE — Consults (Signed)
 [facility]  Gastroenterology/ Hepatology Brief Progress Note      Gail Morris  Date of service: 04/01/2024    Chart reviewed with Dr Lydia Sams. EGD tomorrow. Full consult to follow.       Jabier Martens, NP-C

## 2024-04-02 ENCOUNTER — Encounter (HOSPITAL_COMMUNITY): Payer: Self-pay

## 2024-04-02 ENCOUNTER — Observation Stay (HOSPITAL_COMMUNITY): Payer: MEDICAID | Admitting: Certified Registered"

## 2024-04-02 ENCOUNTER — Encounter (HOSPITAL_COMMUNITY): Admission: EM | Disposition: A | Discharge status: Home / Self Care | Payer: Self-pay | Source: Home / Self Care

## 2024-04-02 ENCOUNTER — Observation Stay (HOSPITAL_COMMUNITY): Payer: MEDICAID

## 2024-04-02 DIAGNOSIS — E860 Dehydration: Secondary | ICD-10-CM

## 2024-04-02 DIAGNOSIS — R079 Chest pain, unspecified: Secondary | ICD-10-CM

## 2024-04-02 DIAGNOSIS — R0602 Shortness of breath: Secondary | ICD-10-CM

## 2024-04-02 DIAGNOSIS — Z79899 Other long term (current) drug therapy: Secondary | ICD-10-CM

## 2024-04-02 DIAGNOSIS — K5900 Constipation, unspecified: Secondary | ICD-10-CM

## 2024-04-02 DIAGNOSIS — K0889 Other specified disorders of teeth and supporting structures: Secondary | ICD-10-CM

## 2024-04-02 DIAGNOSIS — K920 Hematemesis: Secondary | ICD-10-CM

## 2024-04-02 DIAGNOSIS — I10 Essential (primary) hypertension: Secondary | ICD-10-CM

## 2024-04-02 DIAGNOSIS — R1310 Dysphagia, unspecified: Secondary | ICD-10-CM

## 2024-04-02 LAB — CBC WITH DIFF
BASOPHIL #: 0.1 10*3/uL (ref 0.00–0.10)
BASOPHIL %: 1 % (ref 0–1)
EOSINOPHIL #: 0.2 10*3/uL (ref 0.00–0.50)
EOSINOPHIL %: 2 % (ref 1–7)
HCT: 36.9 % (ref 31.2–41.9)
HGB: 12.8 g/dL (ref 10.9–14.3)
LYMPHOCYTE #: 2.8 10*3/uL (ref 1.10–3.10)
LYMPHOCYTE %: 37 % (ref 16–46)
MCH: 31.5 pg (ref 24.7–32.8)
MCHC: 34.7 g/dL (ref 32.3–35.6)
MCV: 91 fL (ref 75.5–95.3)
MONOCYTE #: 0.6 10*3/uL (ref 0.20–0.90)
MONOCYTE %: 8 % (ref 4–11)
MPV: 8.2 fL (ref 7.9–10.8)
NEUTROPHIL #: 4 10*3/uL (ref 1.90–8.20)
NEUTROPHIL %: 52 % (ref 43–77)
PLATELETS: 219 10*3/uL (ref 140–440)
RBC: 4.05 10*6/uL (ref 3.63–4.92)
RDW: 13.7 % (ref 12.3–17.7)
WBC: 7.6 10*3/uL (ref 3.8–11.8)

## 2024-04-02 LAB — MAGNESIUM: MAGNESIUM: 1.7 mg/dL — ABNORMAL LOW (ref 1.9–2.7)

## 2024-04-02 LAB — BILIRUBIN, CONJUGATED (DIRECT): BILIRUBIN DIRECT: 0.06 md/dL (ref 0.03–0.18)

## 2024-04-02 LAB — COMPREHENSIVE METABOLIC PANEL, NON-FASTING
ALBUMIN/GLOBULIN RATIO: 1.3 (ref 0.8–1.4)
ALBUMIN: 3.4 g/dL — ABNORMAL LOW (ref 3.5–5.7)
ALKALINE PHOSPHATASE: 72 U/L (ref 34–104)
ALT (SGPT): 9 U/L (ref 7–52)
ANION GAP: 8 mmol/L (ref 4–13)
AST (SGOT): 16 U/L (ref 13–39)
BILIRUBIN TOTAL: 0.6 mg/dL (ref 0.3–1.0)
BUN/CREA RATIO: 24 — ABNORMAL HIGH (ref 6–22)
BUN: 7 mg/dL (ref 7–25)
CALCIUM, CORRECTED: 9 mg/dL (ref 8.9–10.8)
CALCIUM: 8.5 mg/dL — ABNORMAL LOW (ref 8.6–10.3)
CHLORIDE: 108 mmol/L — ABNORMAL HIGH (ref 98–107)
CO2 TOTAL: 23 mmol/L (ref 21–31)
CREATININE: 0.29 mg/dL — ABNORMAL LOW (ref 0.60–1.30)
ESTIMATED GFR: 125 mL/min/{1.73_m2} (ref >59)
GLOBULIN: 2.7 (ref 2.0–3.5)
GLUCOSE: 130 mg/dL — ABNORMAL HIGH (ref 74–109)
OSMOLALITY, CALCULATED: 277 mosm/kg (ref 270–290)
POTASSIUM: 3.1 mmol/L — ABNORMAL LOW (ref 3.5–5.1)
PROTEIN TOTAL: 6.1 g/dL — ABNORMAL LOW (ref 6.4–8.9)
SODIUM: 139 mmol/L (ref 136–145)

## 2024-04-02 LAB — PT/INR
INR: 1.09 (ref 0.84–1.10)
PROTHROMBIN TIME: 12.3 s (ref 9.8–12.7)

## 2024-04-02 LAB — POC BLOOD GLUCOSE (RESULTS)
GLUCOSE, POC: 150 mg/dL — ABNORMAL HIGH (ref 70–100)
GLUCOSE, POC: 216 mg/dL — ABNORMAL HIGH (ref 70–100)
GLUCOSE, POC: 222 mg/dL — ABNORMAL HIGH (ref 70–100)

## 2024-04-02 LAB — PTT (PARTIAL THROMBOPLASTIN TIME): APTT: 28.8 s (ref 25.0–38.0)

## 2024-04-02 LAB — PHOSPHORUS: PHOSPHORUS: 2 mg/dL — ABNORMAL LOW (ref 3.7–7.2)

## 2024-04-02 LAB — TROPONIN-I: TROPONIN I: 6 ng/L (ref <15)

## 2024-04-02 SURGERY — GASTROSCOPY WITH DILATION
Anesthesia: General | Site: Mouth | Wound class: Clean Contaminated Wounds-The respiratory, GI, Genital, or urinary

## 2024-04-02 MED ORDER — ALBUTEROL SULFATE 2.5 MG/3 ML (0.083 %) SOLUTION FOR NEBULIZATION
2.5000 mg | INHALATION_SOLUTION | Freq: Once | RESPIRATORY_TRACT | Status: AC
Start: 2024-04-02 — End: 2024-04-02
  Administered 2024-04-02: 2.5 mg via RESPIRATORY_TRACT

## 2024-04-02 MED ORDER — MAGNESIUM SULFATE 1 GRAM/100 ML IN DEXTROSE 5 % INTRAVENOUS PIGGYBACK
1.0000 g | INJECTION | INTRAVENOUS | Status: AC
Start: 2024-04-02 — End: 2024-04-02
  Administered 2024-04-02: 1 g via INTRAVENOUS
  Administered 2024-04-02: 0 g via INTRAVENOUS
  Administered 2024-04-02: 1 g via INTRAVENOUS
  Administered 2024-04-02: 0 g via INTRAVENOUS
  Filled 2024-04-02 (×2): qty 100

## 2024-04-02 MED ORDER — SENNOSIDES 8.6 MG-DOCUSATE SODIUM 50 MG TABLET
1.0000 | ORAL_TABLET | Freq: Two times a day (BID) | ORAL | Status: DC
Start: 2024-04-02 — End: 2024-04-03
  Administered 2024-04-02 – 2024-04-03 (×2): 1 via ORAL
  Filled 2024-04-02 (×2): qty 1

## 2024-04-02 MED ORDER — PANTOPRAZOLE 40 MG TABLET,DELAYED RELEASE
40.0000 mg | DELAYED_RELEASE_TABLET | Freq: Every day | ORAL | Status: DC
Start: 2024-04-03 — End: 2024-04-03
  Administered 2024-04-03: 40 mg via ORAL
  Filled 2024-04-02: qty 1

## 2024-04-02 MED ORDER — LIDOCAINE (PF) 20 MG/ML (2 %) INJECTION SOLUTION
INTRAMUSCULAR | Status: AC
Start: 2024-04-02 — End: 2024-04-02
  Filled 2024-04-02: qty 5

## 2024-04-02 MED ORDER — POTASSIUM CHLORIDE 20 MEQ/100ML IN STERILE WATER INTRAVENOUS PIGGYBACK
20.0000 meq | INJECTION | Freq: Once | INTRAVENOUS | Status: AC
Start: 2024-04-02 — End: 2024-04-02
  Administered 2024-04-02: 20 meq via INTRAVENOUS
  Administered 2024-04-02: 0 meq via INTRAVENOUS
  Filled 2024-04-02: qty 100

## 2024-04-02 MED ORDER — LIDOCAINE (PF) 100 MG/5 ML (2 %) INTRAVENOUS SYRINGE
INJECTION | Freq: Once | INTRAVENOUS | Status: DC | PRN
Start: 2024-04-02 — End: 2024-04-02
  Administered 2024-04-02: 100 mg via INTRAVENOUS

## 2024-04-02 MED ORDER — LACTATED RINGERS INTRAVENOUS SOLUTION
INTRAVENOUS | Status: DC | PRN
Start: 2024-04-02 — End: 2024-04-02
  Administered 2024-04-02: 0 via INTRAVENOUS

## 2024-04-02 MED ORDER — PROPOFOL 10 MG/ML IV BOLUS
INJECTION | Freq: Once | INTRAVENOUS | Status: DC | PRN
Start: 2024-04-02 — End: 2024-04-02
  Administered 2024-04-02: 75 mg via INTRAVENOUS
  Administered 2024-04-02: 150 mg via INTRAVENOUS

## 2024-04-02 MED ORDER — PROPOFOL 10 MG/ML INTRAVENOUS EMULSION
INTRAVENOUS | Status: AC
Start: 2024-04-02 — End: 2024-04-02
  Filled 2024-04-02: qty 20

## 2024-04-02 MED ORDER — FAMOTIDINE 40 MG TABLET
40.0000 mg | ORAL_TABLET | Freq: Two times a day (BID) | ORAL | Status: DC
Start: 2024-04-02 — End: 2024-04-03
  Administered 2024-04-02 – 2024-04-03 (×2): 40 mg via ORAL
  Filled 2024-04-02 (×2): qty 1

## 2024-04-02 MED ORDER — POLYETHYLENE GLYCOL 3350 17 GRAM ORAL POWDER PACKET
17.0000 g | Freq: Every day | ORAL | Status: DC
Start: 2024-04-02 — End: 2024-04-03
  Administered 2024-04-02 – 2024-04-03 (×2): 17 g via ORAL
  Filled 2024-04-02 (×2): qty 1

## 2024-04-02 SURGICAL SUPPLY — 4 items
CLEANER INSTRUMENT PRE-KLENZ 13.5 OZ (MISCELLANEOUS PT CARE ITEMS) ×1 IMPLANT
DILATOR ENDOS CRE 240CM 5.5CM 15-16.5-18MM 7.5FR ESOPH PYL BIL BAL LOW PROF GW PEBAX STRL LF  DISP (ENDOSCOPIC SUPPLIES) ×1 IMPLANT
FORCEPS BIOPSY MICROMESH TTH STREAMLINE CATH 240CM 2.4MM RJ (ENDOSCOPIC SUPPLIES) ×1 IMPLANT
SYRINGE INFLAT ALN II GA STRL DISP 60ML (ENDOSCOPIC SUPPLIES) ×1 IMPLANT

## 2024-04-02 NOTE — Anesthesia Postprocedure Evaluation (Signed)
 Anesthesia Post Op Evaluation    Patient: Gail Morris  Procedure(s):  EGD WITH DILATION TO 16.5CM AND BIOPSY OF THE ANTRUM AND GE JUNCTION    Last Vitals:Temperature: 36.9 C (98.4 F) (04/02/24 1227)  Heart Rate: 75 (04/02/24 1227)  BP (Non-Invasive): (!) 140/81 (04/02/24 1006)  Respiratory Rate: 16 (04/02/24 1227)  SpO2: 94 % (04/02/24 1227)    No notable events documented.    Patient is sufficiently recovered from the effects of anesthesia to participate in the evaluation and has returned to their pre-procedure level.  Patient location during evaluation: PACU       Patient participation: complete - patient participated  Level of consciousness: awake and alert and responsive to verbal stimuli    Pain management: adequate  Airway patency: patent    Anesthetic complications: no  Cardiovascular status: acceptable  Respiratory status: acceptable  Hydration status: acceptable  Patient post-procedure temperature: Pt Normothermic   PONV Status: Absent

## 2024-04-02 NOTE — OR Surgeon (Addendum)
 [facility]      EGD NOTE               Patient Name: Gail Morris  Age:  58 y.o.  Sex:  female  MRN:  [id number]  CSN:  [id number]  Date of Birth: May 16, 1966    Medications Given: Per Nurse Anesthetist    Equipment Used: Olympus [id number]    Date: 04/02/24     Indications:   GERD, dysphagia     The scope was passed into the esophagus without difficulty under direct visualization. There was mild erythema at the distal esophagus. No varices.  A small hiatal hernia was seen. The mucosa in the cardia appeared normal. The diaphragmatic impingement on retroflexion view appeared normal. The Z line slightly irregular. The mucosa fundus appeared normal. The mucosa in the body and antrum had mild erythema.     The mucosa in the duodenal bulb appeared normal.. The mucosa past the duodenal bulb to the 2nd portion of the duodenum appeared normal.      We decided to proceed with balloon dilatation due to the improvement in symptoms with previous dilatation. A 3 stage balloon catheter was positioned across the GE junction. The balloon was inflated to 15 mm, then 16.5 mm. The esophageal inlet was similarly dilated to 16.5 mm.  Biopsies were not taken. Photograph(s) Taken: Yes; Blood loss: none;     Impression:  1.   Mild distal esophagitis, grade 1   2.   Small hiatal hernia  3.   Mild gastritis   4.   GE junction and esophageal inlet dilated to 16.5 mm as described above     Recommendations:  Continue Pantoprazole  40 mg po q day, before dinner  Begin Famotidine  40 mg po q am  Await result of biopsies  Maintain antireflux precautions    Reginald Blanch, MD  Addendum:  Biopsies were taken from the antrum and GE junction (biopsies results available on pathology report).

## 2024-04-02 NOTE — Care Plan (Signed)
 Medical Nutrition Therapy Follow Up    SUBJECTIVE: Patient back to floor from procedure. Hungry.  Has only had a little water . She loves coffee.  S/p EGD with dilation.   Discussed soft foods that are easiest to tolerate. Encouraged small bites, chew thoroughly and include adequate fluids with swallow.  Patient reports she does take a really long to eat a meal.      OBJECTIVE: K 3.1, gluc 130    Current Diet Order/Nutrition Support:  DIET REGULAR Do you want to initiate MNT Protocol? Yes; Additional modifications/limitations: GI/SOFT; Solids Dysphagia Modifications: DYSPHAGIA ADVANCED (Soft & Bite-Sized)     Comments:  good appetite     Plan/Interventions : resume CC diet with texture modification  Monitor swallow/meal tolerance    Goal: adequate intake, weight stability    Delwin Agent, RDLD

## 2024-04-02 NOTE — Care Plan (Signed)
 Problem: Wound  Goal: Optimal Coping  Outcome: Ongoing (see interventions/notes)  Goal: Optimal Functional Ability  Outcome: Ongoing (see interventions/notes)  Goal: Absence of Infection Signs and Symptoms  Outcome: Ongoing (see interventions/notes)  Goal: Improved Oral Intake  Outcome: Ongoing (see interventions/notes)  Goal: Optimal Pain Control and Function  Outcome: Ongoing (see interventions/notes)  Goal: Skin Health and Integrity  Outcome: Ongoing (see interventions/notes)  Goal: Optimal Wound Healing  Outcome: Ongoing (see interventions/notes)     Problem: Adult Inpatient Plan of Care  Goal: Plan of Care Review  Outcome: Ongoing (see interventions/notes)  Goal: Patient-Specific Goal (Individualized)  Outcome: Ongoing (see interventions/notes)  Flowsheets (Taken 04/02/2024 0916)  Individualized Care Needs: monitor labs and vitals  Anxieties, Fears or Concerns: None voiced at this time  Goal: Absence of Hospital-Acquired Illness or Injury  Outcome: Ongoing (see interventions/notes)  Intervention: Identify and Manage Fall Risk  Recent Flowsheet Documentation  Taken 04/02/2024 0916 by Corine DASEN, GN  Safety Promotion/Fall Prevention:   fall prevention program maintained   nonskid shoes/slippers when out of bed   safety round/check completed  Intervention: Prevent Skin Injury  Recent Flowsheet Documentation  Taken 04/02/2024 0800 by Corine T, GN  Body Position: sitting  Intervention: Prevent and Manage VTE (Venous Thromboembolism) Risk  Recent Flowsheet Documentation  Taken 04/02/2024 0916 by Corine DASEN, GN  Sequential Compression Device (SCDs): Sequential compression device off  VTE Prevention/Management: anticoagulant therapy maintained  Taken 04/02/2024 0800 by Corine T, GN  Sequential Compression Device (SCDs): Sequential compression device off  Goal: Optimal Comfort and Wellbeing  Outcome: Ongoing (see interventions/notes)  Intervention: Provide Person-Centered Care  Recent Flowsheet Documentation  Taken  04/02/2024 0916 by Corine DASEN, GN  Trust Relationship/Rapport:   care explained   choices provided   thoughts/feelings acknowledged  Goal: Rounds/Family Conference  Outcome: Ongoing (see interventions/notes)     Problem: Health Knowledge, Opportunity to Enhance (Adult,Obstetrics,Pediatric)  Goal: Knowledgeable about Health Subject/Topic  Description: Patient will demonstrate the desired outcomes by discharge/transition of care.  Outcome: Ongoing (see interventions/notes)     Problem: Fall Injury Risk  Goal: Absence of Fall and Fall-Related Injury  Outcome: Ongoing (see interventions/notes)  Intervention: Promote Scientist, clinical (histocompatibility and immunogenetics) Documentation  Taken 04/02/2024 0916 by Corine DASEN, GN  Safety Promotion/Fall Prevention:   fall prevention program maintained   nonskid shoes/slippers when out of bed   safety round/check completed     Problem: Skin Injury Risk Increased  Goal: Skin Health and Integrity  Outcome: Ongoing (see interventions/notes)     Problem: Depression  Goal: Decreased Depression Symptoms  Outcome: Ongoing (see interventions/notes)

## 2024-04-02 NOTE — Nurses Notes (Signed)
 Patient stated that she was having center chest pain 15 minutes ago, she was lying supine, felt like someone was sitting on her chest and she got short of breath. She described it as radiating to her back as well. She said it was about an 8 out of 10. Provider notified. CXR, Troponin and EKG ordered.

## 2024-04-02 NOTE — Progress Notes (Signed)
 [facility]               IP PROGRESS NOTE      Gail Morris, Heuberger  Date of Admission:  03/31/2024  Date of Birth:  1966/05/15  Date of Service:  04/02/2024    Hospital Day:  LOS: 1 day     Subjective:   Patient up ambulating in room. Visitor at bedside. Denies any further nausea, vomiting or abdominal pain. Is for EGD this morning. Does complain of constipation and wants bowel regimen.     Vital Signs:  Temp (24hrs) Max:36.7 C (98 F)      Temperature: 36.6 C (97.8 F)  BP (Non-Invasive): (!) 140/81  MAP (Non-Invasive): 98 mmHG  Heart Rate: 72  Respiratory Rate: 18  SpO2: 96 %    Current Medications:  acetaminophen  (TYLENOL ) tablet, 650 mg, Oral, Q4H PRN  aluminum -magnesium  hydroxide-simethicone  (MAG-AL PLUS) 200-200-20 mg per 5 mL oral liquid, 30 mL, Oral, Q4H PRN  aspirin  chewable tablet 81 mg, 81 mg, Oral, Daily  Correction/SSIP insulin  lispro 100 units/mL injection, 3-14 Units, Subcutaneous, 4x/day AC  dextrose  (GLUTOSE) 40% oral gel, 15 g, Oral, Q15 Min PRN  dextrose  50% (0.5 g/mL) injection - syringe, 12.5 g, Intravenous, Q15 Min PRN  docusate sodium  (COLACE) capsule, 100 mg, Oral, 2x/day PRN  enoxaparin  PF (LOVENOX ) 40 mg/0.4 mL SubQ injection, 40 mg, Subcutaneous, Daily  folic acid  (FOLVITE ) tablet, 1 mg, Oral, Daily  gabapentin  (NEURONTIN ) capsule, 800 mg, Oral, 3x/day  glucagon  injection 1 mg, 1 mg, IntraMUSCULAR, Once PRN  insulin  glargine 100 units/mL injection, 10 Units, Subcutaneous, NIGHTLY  LR premix infusion, , Intravenous, Continuous  multivitamin (THERA) tablet, 1 Tablet, Oral, Daily  nicotine  (NICODERM CQ ) transdermal patch (mg/24 hr), 21 mg, Transdermal, Q24H  NS flush syringe, 3 mL, Intracatheter, Q8HRS  NS flush syringe, 3 mL, Intracatheter, Q1H PRN  ondansetron  (ZOFRAN ) 2 mg/mL injection, 4 mg, Intravenous, Q6H PRN  pantoprazole  (PROTONIX ) 4 mg/mL injection, 40 mg, Intravenous, Daily  potassium & sodium phosphate  (K PHOS  NEUTRAL) tablet, 250 mg, Oral, 4x/day PC      Current  Orders:  Active Orders   Lab    CBC WITH DIFF     Frequency: Once     Number of Occurrences: 1 Occurrences    CBC/DIFF     Frequency: ONE TIME     Number of Occurrences: 1 Occurrences    CBC/DIFF     Linked Order: And     Frequency: 0530 - AM DRAW     Number of Occurrences: 1 Occurrences    CBC/DIFF     Linked Order: And     Frequency: 0530 - AM DRAW     Number of Occurrences: 1 Occurrences    CBC/DIFF     Linked Order: And     Frequency: 0530 - AM DRAW     Number of Occurrences: 1 Occurrences    CBC/DIFF     Linked Order: And     Frequency: 0530 - AM DRAW     Number of Occurrences: 1 Occurrences    CBC/DIFF     Linked Order: And     Frequency: 0530 - AM DRAW     Number of Occurrences: 1 Occurrences    COMPREHENSIVE METABOLIC PANEL, NON-FASTING     Linked Order: And     Frequency: 0530 - AM DRAW     Number of Occurrences: 1 Occurrences    COMPREHENSIVE METABOLIC PANEL, NON-FASTING     Linked Order:  And     Frequency: 0530 - AM DRAW     Number of Occurrences: 1 Occurrences    COMPREHENSIVE METABOLIC PANEL, NON-FASTING     Linked Order: And     Frequency: 0530 - AM DRAW     Number of Occurrences: 1 Occurrences    COMPREHENSIVE METABOLIC PANEL, NON-FASTING     Linked Order: And     Frequency: 0530 - AM DRAW     Number of Occurrences: 1 Occurrences    MAGNESIUM      Linked Order: And     Frequency: 0530 - AM DRAW     Number of Occurrences: 1 Occurrences    MAGNESIUM      Linked Order: And     Frequency: 0530 - AM DRAW     Number of Occurrences: 1 Occurrences    MAGNESIUM      Linked Order: And     Frequency: 0530 - AM DRAW     Number of Occurrences: 1 Occurrences    MAGNESIUM      Linked Order: And     Frequency: 0530 - AM DRAW     Number of Occurrences: 1 Occurrences    PHOSPHORUS     Linked Order: And     Frequency: 0530 - AM DRAW     Number of Occurrences: 1 Occurrences    PHOSPHORUS     Linked Order: And     Frequency: 0530 - AM DRAW     Number of Occurrences: 1 Occurrences    PHOSPHORUS     Linked Order: And      Frequency: 0530 - AM DRAW     Number of Occurrences: 1 Occurrences    PHOSPHORUS     Linked Order: And     Frequency: 0530 - AM DRAW     Number of Occurrences: 1 Occurrences   Diet    DIET NPO - SPECIFIC DATE & TIME EXCEPT ALL MEDS WITH SIPS OF WATER      Frequency: ALL MEALS (NPO ONLY)     Number of Occurrences: 1 Occurrences   Nursing    ACTIVITY     Frequency: UNTIL DISCONTINUED     Number of Occurrences: Until Specified    APPLY SEQUENTIAL COMPRESSION DEVICE     Frequency: ONE TIME     Number of Occurrences: 1 Occurrences    HYPOGLYCEMIA MANAGEMENT - CONSCIOUS PATIENT W/DIET ORDER     Frequency: UNTIL DISCONTINUED     Number of Occurrences: Until Specified    HYPOGLYCEMIA MANAGEMENT - UNCONSCIOUS/ALTERED/NPO PATIENT     Frequency: UNTIL DISCONTINUED     Number of Occurrences: Until Specified    HYPOGLYCEMIA TREATMENT ALGORITHM     Frequency: UNTIL DISCONTINUED     Number of Occurrences: Until Specified    INTAKE AND OUTPUT Q4H     Frequency: Q4H     Number of Occurrences: Until Specified    MAINTAIN SEQUENTIAL COMPRESSION DEVICE     Frequency: CONTINUOUS     Number of Occurrences: Until Specified    Notify MD Vital Signs     Frequency: PRN     Number of Occurrences: Until Specified    NURSE TO ENTER SECONDARY ORDER Other - (specify in comments) (CHEST PAIN AND/OR ARRYTHMIA)     Frequency: UNTIL DISCONTINUED     Number of Occurrences: Until Specified     Order Comments: Cardiac Evaluation      PT IS INTERMEDIATE RISK FOR VENOUS THROMBOEMBOLISM     Frequency: CONTINUOUS  Number of Occurrences: Until Specified    PULSE OXIMETRY Q4H     Frequency: Q4H     Number of Occurrences: Until Specified    TELEMETRY MONITORING X 48H     Frequency: CONTINUOUS X 48 HRS     Number of Occurrences: 48 Hours    VITAL SIGNS  Q4H     Frequency: Q4H     Number of Occurrences: Until Specified    WEIGH PATIENT     Frequency: DAILY (0600)     Number of Occurrences: Until Specified    WEIGH PATIENT     Frequency: UPON ADMISSION      Number of Occurrences: 1 Occurrences   Code Status    FULL CODE: ATTEMPT RESUSCITATION / CPR     Frequency: CONTINUOUS     Number of Occurrences: Until Specified     Order Comments: Patient wishes for full ICU level care including advanced airway interventions / mechanical ventilation.     In the event of pulseless cardiac arrest, patient consents to ACLS (advanced cardiac life support) to attempt resuscitation.  IE - Consents to chest compressions, life support including intubation, mechanical ventilation, defibrillation/cardioversion as indicated.         Consult    IP CONSULT TO GASTROENTEROLOGY On-Call Provider (nurse/clerk to determine)     Frequency: ONE TIME     Number of Occurrences: 1 Occurrences   Respiratory Care    OXYGEN - NASAL CANNULA     Frequency: QHS PRN     Number of Occurrences: Until Specified     Order Comments: Flowrate should not exeed 6L/min except [facility] facility.          Point of Care Testing    PERFORM POC WHOLE BLOOD GLUCOSE     Frequency: TID AC & HS     Number of Occurrences: Until Specified   IV    INSERT & MAINTAIN PERIPHERAL IV ACCESS     Frequency: UNTIL DISCONTINUED     Number of Occurrences: Until Specified    PERIPHERAL IV DRESSING CHANGE     Frequency: PRN     Number of Occurrences: Until Specified   Case Request    CASE REQUEST SURGICAL: GASTROSCOPY     Frequency: ONE TIME     Number of Occurrences: 1 Occurrences   Medications    acetaminophen  (TYLENOL ) tablet     Frequency: Q4H PRN     Dose: 650 mg     Route: Oral    aluminum -magnesium  hydroxide-simethicone  (MAG-AL PLUS) 200-200-20 mg per 5 mL oral liquid     Frequency: Q4H PRN     Dose: 30 mL     Route: Oral    aspirin  chewable tablet 81 mg     Frequency: Daily     Dose: 81 mg     Route: Oral    Correction/SSIP insulin  lispro 100 units/mL injection     Frequency: 4x/day AC     Dose: 3-14 Units     Route: Subcutaneous    dextrose  (GLUTOSE) 40% oral gel     Frequency: Q15 Min PRN     Dose: 15 g     Route: Oral    dextrose  50%  (0.5 g/mL) injection - syringe     Frequency: Q15 Min PRN     Dose: 12.5 g     Route: Intravenous    docusate sodium  (COLACE) capsule     Frequency: 2x/day PRN     Dose: 100 mg  Route: Oral    enoxaparin  PF (LOVENOX ) 40 mg/0.4 mL SubQ injection     Frequency: Daily     Dose: 40 mg     Route: Subcutaneous    folic acid  (FOLVITE ) tablet     Frequency: Daily     Dose: 1 mg     Route: Oral    gabapentin  (NEURONTIN ) capsule     Frequency: 3x/day     Dose: 800 mg     Route: Oral    glucagon  injection 1 mg     Frequency: Once PRN     Dose: 1 mg     Route: IntraMUSCULAR    insulin  glargine 100 units/mL injection     Frequency: NIGHTLY     Dose: 10 Units     Route: Subcutaneous    LR premix infusion     Frequency: Continuous     Route: Intravenous    multivitamin (THERA) tablet     Frequency: Daily     Dose: 1 Tablet     Route: Oral    nicotine  (NICODERM CQ ) transdermal patch (mg/24 hr)     Frequency: Q24H     Dose: 21 mg     Route: Transdermal    NS flush syringe     Frequency: Q8HRS     Dose: 3 mL     Route: Intracatheter    NS flush syringe     Frequency: Q1H PRN     Dose: 3 mL     Route: Intracatheter    ondansetron  (ZOFRAN ) 2 mg/mL injection     Frequency: Q6H PRN     Dose: 4 mg     Route: Intravenous    pantoprazole  (PROTONIX ) 4 mg/mL injection     Frequency: Daily     Dose: 40 mg     Route: Intravenous    potassium & sodium phosphate  (K PHOS  NEUTRAL) tablet     Frequency: 4x/day PC     Dose: 250 mg     Route: Oral      Review of Systems:  Focused review of system was completed. Refer to the HPI for ROS details.     Today's Physical Exam:  Physical Exam  Constitutional:       General: She is not in acute distress.  Cardiovascular:      Rate and Rhythm: Normal rate and regular rhythm.   Pulmonary:      Effort: Pulmonary effort is normal. No respiratory distress.      Breath sounds: Normal breath sounds. No wheezing.   Abdominal:      Palpations: Abdomen is soft.   Skin:     General: Skin is warm.   Neurological:       General: No focal deficit present.      Mental Status: She is alert.     I/O:  I/O last 24 hours:    Intake/Output Summary (Last 24 hours) at 04/02/2024 1121  Last data filed at 04/02/2024 0804  Gross per 24 hour   Intake 780 ml   Output --   Net 780 ml     I/O current shift:  06/20 0700 - 06/20 1859  In: 100   Out: -   Labs:  Reviewed: I have reviewed all lab results.  Lab Results Today:    Results for orders placed or performed during the hospital encounter of 03/31/24 (from the past 24 hours)   BASIC METABOLIC PANEL   Result Value Ref Range    SODIUM  138 136 - 145 mmol/L    POTASSIUM 3.3 (L) 3.5 - 5.1 mmol/L    CHLORIDE 108 (H) 98 - 107 mmol/L    CO2 TOTAL 20 (L) 21 - 31 mmol/L    ANION GAP 10 4 - 13 mmol/L    CALCIUM 8.9 8.6 - 10.3 mg/dL    GLUCOSE 830 (H) 74 - 109 mg/dL    BUN 12 7 - 25 mg/dL    CREATININE 9.64 (L) 0.60 - 1.30 mg/dL    BUN/CREA RATIO 34 (H) 6 - 22    ESTIMATED GFR 120 >59 mL/min/1.6m^2    OSMOLALITY, CALCULATED 279 270 - 290 mOsm/kg   POC BLOOD GLUCOSE (RESULTS)   Result Value Ref Range    GLUCOSE, POC 142 (H) 70 - 100 mg/dl   POC BLOOD GLUCOSE (RESULTS)   Result Value Ref Range    GLUCOSE, POC 225 (H) 70 - 100 mg/dl   MAGNESIUM    Result Value Ref Range    MAGNESIUM  1.7 (L) 1.9 - 2.7 mg/dL   PHOSPHORUS   Result Value Ref Range    PHOSPHORUS 2.0 (L) 3.7 - 7.2 mg/dL   PTT (PARTIAL THROMBOPLASTIN TIME)   Result Value Ref Range    APTT 28.8 25.0 - 38.0 seconds   PT/INR   Result Value Ref Range    PROTHROMBIN TIME 12.3 9.8 - 12.7 seconds    INR 1.09 0.84 - 1.10   COMPREHENSIVE METABOLIC PANEL, NON-FASTING   Result Value Ref Range    SODIUM 139 136 - 145 mmol/L    POTASSIUM 3.1 (L) 3.5 - 5.1 mmol/L    CHLORIDE 108 (H) 98 - 107 mmol/L    CO2 TOTAL 23 21 - 31 mmol/L    ANION GAP 8 4 - 13 mmol/L    BUN 7 7 - 25 mg/dL    CREATININE 9.70 (L) 0.60 - 1.30 mg/dL    BUN/CREA RATIO 24 (H) 6 - 22    ESTIMATED GFR 125 >59 mL/min/1.70m^2    ALBUMIN 3.4 (L) 3.5 - 5.7 g/dL    CALCIUM 8.5 (L) 8.6 - 10.3 mg/dL     GLUCOSE 869 (H) 74 - 109 mg/dL    ALKALINE PHOSPHATASE 72 34 - 104 U/L    ALT (SGPT) 9 7 - 52 U/L    AST (SGOT) 16 13 - 39 U/L    BILIRUBIN TOTAL 0.6 0.3 - 1.0 mg/dL    PROTEIN TOTAL 6.1 (L) 6.4 - 8.9 g/dL    ALBUMIN/GLOBULIN RATIO 1.3 0.8 - 1.4    OSMOLALITY, CALCULATED 277 270 - 290 mOsm/kg    CALCIUM, CORRECTED 9.0 8.9 - 10.8 mg/dL    GLOBULIN 2.7 2.0 - 3.5   BILIRUBIN, CONJUGATED (DIRECT)   Result Value Ref Range    BILIRUBIN DIRECT 0.06 0.03 - 0.18 md/dL   CBC WITH DIFF   Result Value Ref Range    WBC 7.6 3.8 - 11.8 x10^3/uL    RBC 4.05 3.63 - 4.92 x10^6/uL    HGB 12.8 10.9 - 14.3 g/dL    HCT 63.0 68.7 - 58.0 %    MCV 91.0 75.5 - 95.3 fL    MCH 31.5 24.7 - 32.8 pg    MCHC 34.7 32.3 - 35.6 g/dL    RDW 86.2 87.6 - 82.2 %    PLATELETS 219 140 - 440 x10^3/uL    MPV 8.2 7.9 - 10.8 fL    NEUTROPHIL % 52 43 - 77 %    LYMPHOCYTE % 37 16 -  46 %    MONOCYTE % 8 4 - 11 %    EOSINOPHIL % 2 1 - 7 %    BASOPHIL % 1 0 - 1 %    NEUTROPHIL # 4.00 1.90 - 8.20 x10^3/uL    LYMPHOCYTE # 2.80 1.10 - 3.10 x10^3/uL    MONOCYTE # 0.60 0.20 - 0.90 x10^3/uL    EOSINOPHIL # 0.20 0.00 - 0.50 x10^3/uL    BASOPHIL # 0.10 0.00 - 0.10 x10^3/uL   POC BLOOD GLUCOSE (RESULTS)   Result Value Ref Range    GLUCOSE, POC 150 (H) 70 - 100 mg/dl     Micro Results: No results found for any visits on 03/31/24 (from the past 24 hours).  Images:   CT ABDOMEN PELVIS WO IV CONTRAST  Result Date: 03/31/2024  Impression No mesenteric inflammation, free fluid or free air Limited gastric wall assessment due to incomplete distention. Possible reflux in the distal esophagus. Consider endoscopy if continued clinical concern for this area. Incidental findings as detailed above Radiologist location ID: [id number]     XR CHEST AP  Result Date: 03/31/2024  Impression NO ACUTE FINDINGS. Radiologist location ID: [id number]      CT ABDOMEN PELVIS WO IV CONTRAST   Final Result   No mesenteric inflammation, free fluid or free air   Limited gastric wall assessment due to  incomplete distention. Possible reflux in the distal esophagus. Consider endoscopy if continued clinical concern for this area.   Incidental findings as detailed above         Radiologist location ID: [id number]         XR CHEST AP   Final Result   NO ACUTE FINDINGS.            Radiologist location ID: [id number]           Nutrition:    DIET NPO - SPECIFIC DATE & TIME EXCEPT ALL MEDS WITH SIPS OF WATER     Additional clinical characteristics related to nutrition:    - monitor for weight changes   - monitor intake and output    - monitor bowel functions     Nutrition Treatment Plan:  (texture modification)    Problem List:  Active Hospital Problems   (*Primary Problem)    Diagnosis    *DKA (diabetic ketoacidosis)     Assessment/ Plan:   # DKA  - resolved  - Lantus   - lispro sliding scale  - IV fluids  - Diabetic diet     # Episode of Afib  - reported as per EMS  - here has been in sinus  - monitor on tele     # Dysphagia  # hematemesis  - consulted GI  - is for EGD today.  - started Protonix     #Other  -senna plus and Miralax  ordered for constipation.   -further treatment adjustments will be made based off clinical course.  See orders for further details.  -Hospitalist personally evaluated and examined the patient in conjunction with the MLP and agree with the assessments, treatment plan and disposition of the patient as recorded by the [facility].     DVT/PE Prophylaxis: Enoxaparin     Duwaine Shropshire, FNP-BC

## 2024-04-02 NOTE — Consults (Signed)
 [place] MEDIC[facility]  Gastroenterology/ Hepatology Consult Note    Date of Service:  04/02/2024  Gail Morris   58 y.o. female  Date of Admission:  03/31/2024  Date of Birth:  23-Feb-1966  1     Reason for Consultation:      Nausea/vomiting, dysphagia    History of Present Illness:  Gail Morris is a 58 y.o. White female who presented to ER with nausea/vomiting that started after getting 5 teeth pulled 2 days ago. She had noticed some blood in it which she thought was from dental extraction.   She has some epigastric pain at times. She has not had any nausea and vomiting since admission. She denies heartburn. She has some dysphagia at times but tries to chew well and water  will wash it down. No dysphagia with liquids, soft foods. She denies diarrhea, constipation, blood in stools or melena.   She has never had EGD or colonoscopy.   CT abdomen at [facility] 03/31/24 - Limited gastric wall assessment due to incomplete distention. Possible reflux in distal esophagus. Consider endoscopy.       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    Anxiety     Breast cancer     Cancer (CMS HCC)     Depression     Diabetes mellitus, type 2     Esophageal reflux     H/O blood clots     H/O foot surgery     Hiccups     HTN (hypertension)     Migraine     Neuropathy (CMS HCC)     Primary generalized (osteo)arthritis       Past Surgical:    Past Surgical History:   Procedure Laterality Date    HAND SURGERY      HX BACK SURGERY      HX BREAST RECONSTRUCTION      HX CESAREAN SECTION      HX CHOLECYSTECTOMY      SHOULDER ADHESION RELEASE Left     SIMPLE MASTECTOMY Bilateral       Family:    Family Medical History:       Problem Relation (Age of Onset)    Diabetes type II Mother    No Known Problems Father           Social:   reports that she has been smoking cigarettes. She has never used smokeless tobacco. She reports current drug  use. Drug: Marijuana. She reports that she does not drink alcohol.     REVIEW OF SYSTEMS:  Review of Systems   Constitutional: Negative for fever.      As per HPI    Allergies[1]    Medications:  Medications Prior to Admission       Prescriptions    ACCU-CHEK GUIDE ME GLUCOSE MTR Does not apply Misc    Four times a day    ACCU-CHEK GUIDE TEST STRIPS Does not apply Strip    Twice daily    ACCU-CHEK SOFTCLIX LANCETS Misc    Apply topically Twice daily    acetaminophen  (TYLENOL ) 500 mg Oral Tablet    Take 2 Tablets (1,000 mg total) by mouth Every 6 hours as needed for Pain    aspirin  (ECOTRIN) 81 mg Oral Tablet, Delayed Release (E.C.)    Take 1 Tablet (81 mg total) by mouth  Daily    BD UF MINI PEN NEEDLE 31 gauge x 3/16 Needle    SMARTSIG:SUB-Q    empagliflozin (JARDIANCE) 25 mg Oral Tablet    Take 1 Tablet (25 mg total) by mouth Daily    ergocalciferol, vitamin D2, (DRISDOL) 1,250 mcg (50,000 unit) Oral Capsule    Take 1 Capsule (50,000 Units total) by mouth Every 7 days    flash glucose scanning reader (FREESTYLE LIBRE 14 DAY READER) Does not apply Misc    USE TO CHECK GLUCOSE AS DIRECTED 4 TIMES DAILY    fluticasone propionate (FLONASE) 50 mcg/actuation Nasal Spray, Suspension    Administer 1 Spray into each nostril Once per day as needed    Patient not taking:  Reported on 04/01/2024    gabapentin  (NEURONTIN ) 100 mg Oral Capsule    1 Capsule (100 mg total) Three times a day    gabapentin  (NEURONTIN ) 800 mg Oral Tablet    Take 1 Tablet (800 mg total) by mouth Three times a day    hydrOXYzine HCL (ATARAX) 50 mg Oral Tablet    Take 1 Tablet (50 mg total) by mouth Four times a day as needed    Patient not taking:  Reported on 04/01/2024    Insulin  Syringe-Needle U-100 0.3 mL 29 gauge x 1/2 Syringe    Daily    methocarbamoL  (ROBAXIN ) 500 mg Oral Tablet    Take 1 Tablet (500 mg total) by mouth Four times a day as needed for Other (Muscle spasms) for up to 5 days    naproxen  (NAPROSYN ) 500 mg Oral Tablet    Take 1 Tablet  (500 mg total) by mouth Twice daily with food    NURTEC ODT 75 mg Oral Tablet, Rapid Dissolve    Place 1 Tablet (75 mg total) under the tongue Every 24 hours as needed    ondansetron  (ZOFRAN  ODT) 4 mg Oral Tablet, Rapid Dissolve    Take 1 Tablet (4 mg total) by mouth Four times a day as needed    ramipriL (ALTACE) 10 mg Oral Capsule    Take 1 Capsule (10 mg total) by mouth Daily    simvastatin (ZOCOR) 10 mg Oral Tablet    SMARTSIG:1 Tablet(s) By Mouth Every Evening    Patient not taking:  Reported on 04/01/2024    TOUJEO  MAX U-300 SOLOSTAR 300 unit/mL (3 mL) Subcutaneous Insulin  Pen    Inject 55 Units under the skin Once a day    VENTOLIN  HFA 90 mcg/actuation Inhalation oral inhaler    Take 2 Puffs by inhalation Every 4 hours as needed          acetaminophen  (TYLENOL ) tablet, 650 mg, Oral, Q4H PRN  aluminum -magnesium  hydroxide-simethicone  (MAG-AL PLUS) 200-200-20 mg per 5 mL oral liquid, 30 mL, Oral, Q4H PRN  aspirin  chewable tablet 81 mg, 81 mg, Oral, Daily  Correction/SSIP insulin  lispro 100 units/mL injection, 3-14 Units, Subcutaneous, 4x/day AC  dextrose  (GLUTOSE) 40% oral gel, 15 g, Oral, Q15 Min PRN  dextrose  50% (0.5 g/mL) injection - syringe, 12.5 g, Intravenous, Q15 Min PRN  docusate sodium  (COLACE) capsule, 100 mg, Oral, 2x/day PRN  enoxaparin  PF (LOVENOX ) 40 mg/0.4 mL SubQ injection, 40 mg, Subcutaneous, Daily  folic acid  (FOLVITE ) tablet, 1 mg, Oral, Daily  gabapentin  (NEURONTIN ) capsule, 800 mg, Oral, 3x/day  glucagon  injection 1 mg, 1 mg, IntraMUSCULAR, Once PRN  insulin  glargine 100 units/mL injection, 10 Units, Subcutaneous, NIGHTLY  LR premix infusion, , Intravenous, Continuous  multivitamin (THERA) tablet, 1 Tablet, Oral, Daily  nicotine  (NICODERM CQ ) transdermal patch (mg/24 hr), 21 mg, Transdermal, Q24H  NS flush syringe, 3 mL, Intracatheter, Q8HRS  NS flush syringe, 3 mL, Intracatheter, Q1H PRN  ondansetron  (ZOFRAN ) 2 mg/mL injection, 4 mg, Intravenous, Q6H PRN  pantoprazole  (PROTONIX ) 4 mg/mL  injection, 40 mg, Intravenous, Daily  potassium & sodium phosphate  (K PHOS  NEUTRAL) tablet, 250 mg, Oral, 4x/day PC        Physical Exam:    Vitals:  Temperature: 36.5 C (97.7 F)  Heart Rate: 76  Respiratory Rate: 16  BP (Non-Invasive): (!) 140/81  SpO2: 97 %    Exam:  Nursing note and vitals reviewed.   Objective:  General Appearance:  Comfortable, well-appearing and in no acute distress.    Vital signs: (most recent): Blood pressure (!) 140/81, pulse 76, temperature 36.5 C (97.7 F), resp. rate 16, height 1.575 m (5' 2.01), weight 54.9 kg (121 lb), SpO2 97%.  Vital signs are normal.  No fever.    Lungs:  Normal effort and normal respiratory rate.  Breath sounds clear to auscultation.    Heart: Normal rate.  Regular rhythm.  S1 normal and S2 normal.    Abdomen: Abdomen is soft.  Bowel sounds are normal.   There is no abdominal tenderness.     Extremities: Normal range of motion.    Pulses: Distal pulses are intact.    Neurological: Patient is alert and oriented to person, place and time.    Skin:  Warm and dry.        Labs:     Results for orders placed or performed during the hospital encounter of 03/31/24 (from the past 24 hours)   POC BLOOD GLUCOSE (RESULTS)   Result Value Ref Range    GLUCOSE, POC 186 (H) 70 - 100 mg/dl   BASIC METABOLIC PANEL   Result Value Ref Range    SODIUM 138 136 - 145 mmol/L    POTASSIUM 3.3 (L) 3.5 - 5.1 mmol/L    CHLORIDE 108 (H) 98 - 107 mmol/L    CO2 TOTAL 20 (L) 21 - 31 mmol/L    ANION GAP 10 4 - 13 mmol/L    CALCIUM 8.9 8.6 - 10.3 mg/dL    GLUCOSE 830 (H) 74 - 109 mg/dL    BUN 12 7 - 25 mg/dL    CREATININE 9.64 (L) 0.60 - 1.30 mg/dL    BUN/CREA RATIO 34 (H) 6 - 22    ESTIMATED GFR 120 >59 mL/min/1.84m^2    OSMOLALITY, CALCULATED 279 270 - 290 mOsm/kg   POC BLOOD GLUCOSE (RESULTS)   Result Value Ref Range    GLUCOSE, POC 142 (H) 70 - 100 mg/dl   POC BLOOD GLUCOSE (RESULTS)   Result Value Ref Range    GLUCOSE, POC 225 (H) 70 - 100 mg/dl   MAGNESIUM    Result Value Ref Range     MAGNESIUM  1.7 (L) 1.9 - 2.7 mg/dL   PHOSPHORUS   Result Value Ref Range    PHOSPHORUS 2.0 (L) 3.7 - 7.2 mg/dL   PTT (PARTIAL THROMBOPLASTIN TIME)   Result Value Ref Range    APTT 28.8 25.0 - 38.0 seconds   PT/INR   Result Value Ref Range    PROTHROMBIN TIME 12.3 9.8 - 12.7 seconds    INR 1.09 0.84 - 1.10   COMPREHENSIVE METABOLIC PANEL, NON-FASTING   Result Value Ref Range    SODIUM 139 136 - 145 mmol/L    POTASSIUM 3.1 (L) 3.5 - 5.1 mmol/L  CHLORIDE 108 (H) 98 - 107 mmol/L    CO2 TOTAL 23 21 - 31 mmol/L    ANION GAP 8 4 - 13 mmol/L    BUN 7 7 - 25 mg/dL    CREATININE 9.70 (L) 0.60 - 1.30 mg/dL    BUN/CREA RATIO 24 (H) 6 - 22    ESTIMATED GFR 125 >59 mL/min/1.48m^2    ALBUMIN 3.4 (L) 3.5 - 5.7 g/dL    CALCIUM 8.5 (L) 8.6 - 10.3 mg/dL    GLUCOSE 869 (H) 74 - 109 mg/dL    ALKALINE PHOSPHATASE 72 34 - 104 U/L    ALT (SGPT) 9 7 - 52 U/L    AST (SGOT) 16 13 - 39 U/L    BILIRUBIN TOTAL 0.6 0.3 - 1.0 mg/dL    PROTEIN TOTAL 6.1 (L) 6.4 - 8.9 g/dL    ALBUMIN/GLOBULIN RATIO 1.3 0.8 - 1.4    OSMOLALITY, CALCULATED 277 270 - 290 mOsm/kg    CALCIUM, CORRECTED 9.0 8.9 - 10.8 mg/dL    GLOBULIN 2.7 2.0 - 3.5   BILIRUBIN, CONJUGATED (DIRECT)   Result Value Ref Range    BILIRUBIN DIRECT 0.06 0.03 - 0.18 md/dL   CBC WITH DIFF   Result Value Ref Range    WBC 7.6 3.8 - 11.8 x10^3/uL    RBC 4.05 3.63 - 4.92 x10^6/uL    HGB 12.8 10.9 - 14.3 g/dL    HCT 63.0 68.7 - 58.0 %    MCV 91.0 75.5 - 95.3 fL    MCH 31.5 24.7 - 32.8 pg    MCHC 34.7 32.3 - 35.6 g/dL    RDW 86.2 87.6 - 82.2 %    PLATELETS 219 140 - 440 x10^3/uL    MPV 8.2 7.9 - 10.8 fL    NEUTROPHIL % 52 43 - 77 %    LYMPHOCYTE % 37 16 - 46 %    MONOCYTE % 8 4 - 11 %    EOSINOPHIL % 2 1 - 7 %    BASOPHIL % 1 0 - 1 %    NEUTROPHIL # 4.00 1.90 - 8.20 x10^3/uL    LYMPHOCYTE # 2.80 1.10 - 3.10 x10^3/uL    MONOCYTE # 0.60 0.20 - 0.90 x10^3/uL    EOSINOPHIL # 0.20 0.00 - 0.50 x10^3/uL    BASOPHIL # 0.10 0.00 - 0.10 x10^3/uL   POC BLOOD GLUCOSE (RESULTS)   Result Value Ref Range    GLUCOSE,  POC 150 (H) 70 - 100 mg/dl       Imaging Studies:    Results for orders placed or performed during the hospital encounter of 03/31/24   CT ABDOMEN PELVIS WO IV CONTRAST     Status: None    Narrative    Gail Morris    RADIOLOGIST: Kelby Frame    CT ABDOMEN PELVIS WO IV CONTRAST performed on 03/31/2024 7:40 PM    CLINICAL HISTORY: upper abd pain burning nv.  N/V, PT STATES SHE HAD BLOOD IN VOMIT, HX OF GB REMOVAL/CSECTION    TECHNIQUE:  Abdomen and pelvis CT without intravenous contrast.    COMPARISON:  March 16, 2023 CT scan    FINDINGS:  Noncontrast technique limits evaluation of the abdominal and pelvic viscera.    Lung bases: Anteriorly over the lower xiphoid area there is a questionable small lipoma versus asymmetric fat in this area. There is no consolidation in the lower chest or acute pleural disease identified.    Liver:   Overall stable contour,  caliber, and density of the liver. Scattered calcifications are nonspecific.    Gallbladder:   Surgically absent    Spleen:   Unremarkable.    Pancreas:   No definite CT evidence of acute pancreatitis, correlate with lipase testing if clinically concern for this diagnosis    Adrenals:   Stable    Kidneys:   No obstructing stones or hydronephrosis. 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 Similar to the prior study    Bladder:  Unremarkable.    Uterus and Adnexa:  Unremarkable.    Bowel:   Assessment of the gastric wall is suboptimal due to incomplete distention. Small amount of fluid at the distal esophagus could indicate reflux. Correlate for symptoms of reflux esophagitis. No dilated small bowel loops to indicate mechanical obstruction. Scattered diverticula are seen involving the colon. Mild to moderate colonic stool retention. No significant focal pericolonic mesenteric inflammatory disease    Appendix:  Normal.    Lymph nodes:  No suspicious lymph node enlargement.    Vasculature:   Scattered  calcified wall plaque at the aorta and major branches, no aortic aneurysm     Peritoneum / Retroperitoneum: No ascites.  No free air.    Bones:   Degenerative changes of the spine with similar vertebral contours compared to the prior exam          Impression    No mesenteric inflammation, free fluid or free air  Limited gastric wall assessment due to incomplete distention. Possible reflux in the distal esophagus. Consider endoscopy if continued clinical concern for this area.  Incidental findings as detailed above      Radiologist location ID: [id number]     XR CHEST AP     Status: None    Narrative    Gail Morris    RADIOLOGIST: Redell Bran    XR CHEST AP performed on 03/31/2024 7:18 PM    CLINICAL HISTORY: cardiac eval  VOMITTING. HX OF BILATERAL BREAST CANCER WITH BILATERAL MASTECTOMYS.    TECHNIQUE: Frontal view of the chest.    COMPARISON:  01/04/2024    FINDINGS:    Cardiac and mediastinal contours are stable.   There are chronic-appearing changes of both lungs.           Impression    NO ACUTE FINDINGS.        Radiologist location ID: [id number]          Assessment/Plan:     Nausea/vomiting  Dysphagia    Agree with current management and supportive care. Plan for EGD today. She is NPO. Risks of procedure discussed with patient including bleeding, infection, perforation, allergic reaction. Will continue to follow. Further recommendations based on clinical course. Patient has been discussed in detail with Dr MARLA Blanch and treatment plan decided by him. Thank you for this consultation.   Suzen Jenkins Signs, CFNP         [1]   Allergies  Allergen Reactions    Dulaglutide  Other Adverse Reaction (Add comment) and Diarrhea     Loss of appetite    Metformin  Other Adverse Reaction (Add comment) and Diarrhea     Dehydrates me    Tramadol  Other Adverse Reaction (Add comment)     Tachycardia    Other Reaction(s): Angioedema, Not available, tachycardia

## 2024-04-02 NOTE — Anesthesia Transfer of Care (Signed)
 ANESTHESIA TRANSFER OF CARE   Gail Morris is a 58 y.o. 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    Anxiety     Breast cancer     Cancer (CMS HCC)     Depression     Diabetes mellitus, type 2     Esophageal reflux     H/O blood clots     H/O foot surgery     Hiccups     HTN (hypertension)     Migraine     Neuropathy (CMS HCC)     Primary generalized (osteo)arthritis       Allergy History as of 04/02/24       TRAMADOL         Noted Status Severity Type Reaction    03/05/24 0042 Moishe Loader, RN 07/15/13 Active Low   Other Adverse Reaction (Add comment)    Comments: Tachycardia    Other Reaction(s): Angioedema, Not available, tachycardia     04/08/22 0700 Celestia Slough, LPN 96/83/76 Active    Other Adverse Reaction (Add comment)    Comments: Tachycardia       12/27/21 2336 Janel Fallow, RN 12/27/21 Active                 DULAGLUTIDE         Noted Status Severity Type Reaction    03/05/24 0042 Moishe Loader, RN 12/27/21 Active Low   Other Adverse Reaction (Add comment), Diarrhea    Comments: Loss of appetite     04/08/22 0701 Celestia Slough, LPN 96/83/76 Active    Other Adverse Reaction (Add comment)    Comments: Loss of appetite     12/27/21 2336 Janel Fallow, RN 12/27/21 Active                 METFORMIN         Noted Status Severity Type Reaction    03/05/24 0042 Moishe Loader, RN 12/27/21 Active Low   Other Adverse Reaction (Add comment), Diarrhea    Comments: Dehydrates me     04/08/22 0700 Celestia Slough, LPN 96/83/76 Active    Other Adverse Reaction (Add comment)    Comments: Dehydrates me     12/27/21 2336 Janel Fallow, RN 12/27/21 Active                     I completed my transfer of care / handoff to the receiving personnel during which we  discussed:  Access, Airway, All key/critical aspects of case discussed, Analgesia, Antibiotics, Expectation of post procedure, Fluids/Product, Gave opportunity for questions and acknowledgement of understanding, Labs and PMHx  Report given to: Trinidad Flint, RN    Post Location: PACU                                                             Last OR Temp: Temperature: 36.9 C (98.4 F)  ABG:  PH (ARTERIAL)   Date Value Ref Range Status   07/05/2023 7.41 7.35 -  7.45 Final     PCO2 (ARTERIAL)   Date Value Ref Range Status   07/05/2023 38 35 - 45 mm/Hg Final     PCO2 (VENOUS)   Date Value Ref Range Status   04/01/2024 20 (L) 41 - 51 mm/Hg Final     PO2 (ARTERIAL)   Date Value Ref Range Status   07/05/2023 75 (L) 80 - 100 mm/Hg Final     PO2 (VENOUS)   Date Value Ref Range Status   04/01/2024 202 35 - 50 mm/Hg Final     POTASSIUM   Date Value Ref Range Status   04/02/2024 3.1 (L) 3.5 - 5.1 mmol/L Final     KETONES   Date Value Ref Range Status   03/31/2024 >=80 (A) Negative mg/dL Final     CALCIUM   Date Value Ref Range Status   04/02/2024 8.5 (L) 8.6 - 10.3 mg/dL Final     Calculated P Axis   Date Value Ref Range Status   04/01/2024 72 degrees Final     Calculated R Axis   Date Value Ref Range Status   04/01/2024 51 degrees Final     Calculated T Axis   Date Value Ref Range Status   04/01/2024 65 degrees Final     CANNAQL   Date Value Ref Range Status   11/11/2022 Positive (A) Negative Final     LACTATE   Date Value Ref Range Status   03/31/2024 2.4 (H) <=1.9 mmol/L Final     Comment:     RESULTS TO AMENEH LAMBERT     OXYHEMOGLOBIN   Date Value Ref Range Status   07/05/2023 90.7 88.0 - 100.0 % Final     CARBOXYHEMOGLOBIN   Date Value Ref Range Status   07/05/2023 4.8 (H) <=1.5 % Final     MET-HEMOGLOBIN   Date Value Ref Range Status   07/05/2023 0.2 <=2.0 % Final     BASE EXCESS   Date Value Ref Range Status   11/28/2022 0.4 -3.0 - 3.0 mmol/L Final     BENZO QL   Date Value Ref Range Status   11/11/2022 Negative  Negative Final     BASE DEFICIT   Date Value Ref Range Status   04/01/2024 5.8 (H) 0.0 - 3.0 mmol/L Final   07/05/2023 0.3 0.0 - 2.0 mmol/L Final     BICARBONATE (ARTERIAL)   Date Value Ref Range Status   07/05/2023 24.3 20.0 - 26.0 mmol/L Final     BICARBONATE (VENOUS)   Date Value Ref Range Status   04/01/2024 20.5 (L) 22.0 - 29.0 mmol/L Final     %FIO2 (VENOUS)   Date Value Ref Range Status   04/01/2024 21.0 % Final     Airway:* No LDAs found *  Blood pressure (!) 140/81, pulse 75, temperature 36.9 C (98.4 F), resp. rate 16, height 1.575 m (5' 2.01), weight 54.9 kg (121 lb), SpO2 94%.

## 2024-04-03 LAB — COMPREHENSIVE METABOLIC PANEL, NON-FASTING
ALBUMIN/GLOBULIN RATIO: 1.3 (ref 0.8–1.4)
ALBUMIN: 3.5 g/dL (ref 3.5–5.7)
ALKALINE PHOSPHATASE: 79 U/L (ref 34–104)
ALT (SGPT): 12 U/L (ref 7–52)
ANION GAP: 8 mmol/L (ref 4–13)
AST (SGOT): 18 U/L (ref 13–39)
BILIRUBIN TOTAL: 0.7 mg/dL (ref 0.3–1.0)
BUN/CREA RATIO: 19 (ref 6–22)
BUN: 6 mg/dL — ABNORMAL LOW (ref 7–25)
CALCIUM, CORRECTED: 9 mg/dL (ref 8.9–10.8)
CALCIUM: 8.6 mg/dL (ref 8.6–10.3)
CHLORIDE: 106 mmol/L (ref 98–107)
CO2 TOTAL: 28 mmol/L (ref 21–31)
CREATININE: 0.31 mg/dL — ABNORMAL LOW (ref 0.60–1.30)
ESTIMATED GFR: 123 mL/min/{1.73_m2} (ref >59)
GLOBULIN: 2.7 (ref 2.0–3.5)
GLUCOSE: 167 mg/dL — ABNORMAL HIGH (ref 74–109)
OSMOLALITY, CALCULATED: 285 mosm/kg (ref 270–290)
POTASSIUM: 3 mmol/L — ABNORMAL LOW (ref 3.5–5.1)
PROTEIN TOTAL: 6.2 g/dL — ABNORMAL LOW (ref 6.4–8.9)
SODIUM: 142 mmol/L (ref 136–145)

## 2024-04-03 LAB — MAGNESIUM: MAGNESIUM: 1.8 mg/dL — ABNORMAL LOW (ref 1.9–2.7)

## 2024-04-03 LAB — ECG 12 LEAD
Atrial Rate: 73 {beats}/min
Calculated P Axis: 64 degrees
Calculated R Axis: 44 degrees
Calculated T Axis: 54 degrees
PR Interval: 168 ms
QRS Duration: 88 ms
QT Interval: 454 ms
QTC Calculation: 500 ms
Ventricular rate: 73 {beats}/min

## 2024-04-03 LAB — CBC WITH DIFF
BASOPHIL #: 0.1 10*3/uL (ref 0.00–0.10)
BASOPHIL %: 1 % (ref 0–1)
EOSINOPHIL #: 0.1 10*3/uL (ref 0.00–0.50)
EOSINOPHIL %: 2 % (ref 1–7)
HCT: 37.8 % (ref 31.2–41.9)
HGB: 13.2 g/dL (ref 10.9–14.3)
LYMPHOCYTE #: 2 10*3/uL (ref 1.10–3.10)
LYMPHOCYTE %: 33 % (ref 16–46)
MCH: 31.8 pg (ref 24.7–32.8)
MCHC: 34.9 g/dL (ref 32.3–35.6)
MCV: 90.9 fL (ref 75.5–95.3)
MONOCYTE #: 0.5 10*3/uL (ref 0.20–0.90)
MONOCYTE %: 8 % (ref 4–11)
MPV: 7.9 fL (ref 7.9–10.8)
NEUTROPHIL #: 3.3 10*3/uL (ref 1.90–8.20)
NEUTROPHIL %: 55 % (ref 43–77)
PLATELETS: 214 10*3/uL (ref 140–440)
RBC: 4.15 10*6/uL (ref 3.63–4.92)
RDW: 13.6 % (ref 12.3–17.7)
WBC: 6 10*3/uL (ref 3.8–11.8)

## 2024-04-03 LAB — POC BLOOD GLUCOSE (RESULTS)
GLUCOSE, POC: 207 mg/dL — ABNORMAL HIGH (ref 70–100)
GLUCOSE, POC: 242 mg/dL — ABNORMAL HIGH (ref 70–100)

## 2024-04-03 LAB — BASIC METABOLIC PANEL
ANION GAP: 9 mmol/L (ref 4–13)
BUN/CREA RATIO: 14 (ref 6–22)
BUN: 5 mg/dL — ABNORMAL LOW (ref 7–25)
CALCIUM: 9.7 mg/dL (ref 8.6–10.3)
CHLORIDE: 106 mmol/L (ref 98–107)
CO2 TOTAL: 24 mmol/L (ref 21–31)
CREATININE: 0.35 mg/dL — ABNORMAL LOW (ref 0.60–1.30)
ESTIMATED GFR: 120 mL/min/{1.73_m2} (ref >59)
GLUCOSE: 166 mg/dL — ABNORMAL HIGH (ref 74–109)
OSMOLALITY, CALCULATED: 279 mosm/kg (ref 270–290)
POTASSIUM: 4 mmol/L (ref 3.5–5.1)
SODIUM: 139 mmol/L (ref 136–145)

## 2024-04-03 LAB — PHOSPHORUS: PHOSPHORUS: 4 mg/dL (ref 3.7–7.2)

## 2024-04-03 MED ORDER — POTASSIUM CHLORIDE ER 20 MEQ TABLET,EXTENDED RELEASE(PART/CRYST)
20.0000 meq | ORAL_TABLET | ORAL | Status: AC
Start: 2024-04-03 — End: 2024-04-03
  Administered 2024-04-03: 20 meq via ORAL
  Filled 2024-04-03: qty 1

## 2024-04-03 MED ORDER — FOLIC ACID 1 MG TABLET
1.0000 mg | ORAL_TABLET | Freq: Every day | ORAL | 0 refills | Status: AC
Start: 2024-04-04 — End: 2024-05-04

## 2024-04-03 MED ORDER — POLYETHYLENE GLYCOL 3350 17 GRAM ORAL POWDER PACKET
17.0000 g | Freq: Every day | ORAL | 0 refills | Status: AC
Start: 2024-04-04 — End: 2024-05-04

## 2024-04-03 MED ORDER — PANTOPRAZOLE 40 MG TABLET,DELAYED RELEASE
40.0000 mg | DELAYED_RELEASE_TABLET | Freq: Every day | ORAL | 0 refills | Status: AC
Start: 2024-04-04 — End: 2024-05-04

## 2024-04-03 MED ORDER — POTASSIUM CHLORIDE 20 MEQ/100ML IN STERILE WATER INTRAVENOUS PIGGYBACK
20.0000 meq | INJECTION | Freq: Once | INTRAVENOUS | Status: AC
Start: 2024-04-03 — End: 2024-04-03
  Administered 2024-04-03: 0 meq via INTRAVENOUS
  Administered 2024-04-03: 20 meq via INTRAVENOUS
  Filled 2024-04-03: qty 100

## 2024-04-03 MED ORDER — FAMOTIDINE 40 MG TABLET
40.0000 mg | ORAL_TABLET | Freq: Two times a day (BID) | ORAL | 0 refills | Status: AC
Start: 2024-04-03 — End: 2024-05-03

## 2024-04-03 MED ORDER — POTASSIUM CHLORIDE 20 MEQ/100ML IN STERILE WATER INTRAVENOUS PIGGYBACK
20.0000 meq | INJECTION | Freq: Once | INTRAVENOUS | Status: AC
Start: 2024-04-03 — End: 2024-04-03
  Administered 2024-04-03: 20 meq via INTRAVENOUS
  Administered 2024-04-03: 0 meq via INTRAVENOUS
  Filled 2024-04-03: qty 100

## 2024-04-03 MED ORDER — MAGNESIUM SULFATE 1 GRAM/100 ML IN DEXTROSE 5 % INTRAVENOUS PIGGYBACK
1.0000 g | INJECTION | Freq: Once | INTRAVENOUS | Status: AC
Start: 2024-04-03 — End: 2024-04-03
  Administered 2024-04-03: 1 g via INTRAVENOUS
  Administered 2024-04-03: 0 g via INTRAVENOUS
  Filled 2024-04-03: qty 100

## 2024-04-03 MED ORDER — SENNOSIDES 8.6 MG-DOCUSATE SODIUM 50 MG TABLET
1.0000 | ORAL_TABLET | Freq: Two times a day (BID) | ORAL | 0 refills | Status: AC | PRN
Start: 2024-04-03 — End: 2024-05-03

## 2024-04-03 MED ORDER — TOUJEO MAX U-300 SOLOSTAR 300 UNIT/ML (3 ML) SUBCUTANEOUS INSULIN PEN
25.0000 [IU] | PEN_INJECTOR | Freq: Every day | SUBCUTANEOUS | 0 refills | Status: AC
Start: 2024-04-03 — End: 2024-05-03

## 2024-04-03 NOTE — Nurses Notes (Signed)
 Patient discharged home with family.  AVS reviewed with patient/care giver. IV removed, catheter intact. Telemetry removed.  A written copy of the AVS and discharge instructions was given to the patient/care giver.  Questions sufficiently answered as needed.  Patient/care giver encouraged to follow up with PCP as indicated.  In the event of an emergency, patient/care giver instructed to call 911 or go to the nearest emergency room.

## 2024-04-03 NOTE — Consults (Signed)
 [facility]  Gastroenterology/ Hepatology Consult Note      Patient: Gail Morris, Gail Morris, 58 y.o. female  Date of Birth: 1965/12/24  Admission Date: 04/01/2024  PCP: Benedict Hasten, DO    History of Present Illness:  Denies abdominal pain. Tolerating diet without N/V. Denies signs GI bleeding. She had EGD yesterday.     Review of Systems   Constitutional: Negative for fever.   As per HPI      Historical Data   Medications Prior to Admission       Prescriptions    ACCU-CHEK GUIDE ME GLUCOSE MTR Does not apply Misc    Four times a day    ACCU-CHEK GUIDE TEST STRIPS Does not apply Strip    Twice daily    ACCU-CHEK SOFTCLIX LANCETS Misc    Apply topically Twice daily    acetaminophen  (TYLENOL ) 500 mg Oral Tablet    Take 2 Tablets (1,000 mg total) by mouth Every 6 hours as needed for Pain    aspirin  (ECOTRIN) 81 mg Oral Tablet, Delayed Release (E.C.)    Take 1 Tablet (81 mg total) by mouth Daily    BD UF MINI PEN NEEDLE 31 gauge x 3/16 Needle    SMARTSIG:SUB-Q    empagliflozin (JARDIANCE) 25 mg Oral Tablet    Take 1 Tablet (25 mg total) by mouth Daily    ergocalciferol, vitamin D2, (DRISDOL) 1,250 mcg (50,000 unit) Oral Capsule    Take 1 Capsule (50,000 Units total) by mouth Every 7 days    flash glucose scanning reader (FREESTYLE LIBRE 14 DAY READER) Does not apply Misc    USE TO CHECK GLUCOSE AS DIRECTED 4 TIMES DAILY    fluticasone propionate (FLONASE) 50 mcg/actuation Nasal Spray, Suspension    Administer 1 Spray into each nostril Once per day as needed    Patient not taking:  Reported on 04/01/2024    gabapentin  (NEURONTIN ) 100 mg Oral Capsule    1 Capsule (100 mg total) Three times a day    gabapentin  (NEURONTIN ) 800 mg Oral Tablet    Take 1 Tablet (800 mg total) by mouth Three times a day    hydrOXYzine HCL (ATARAX) 50 mg Oral Tablet    Take 1 Tablet (50 mg total) by mouth Four times a day as needed    Patient not taking:  Reported on 04/01/2024    Insulin  Syringe-Needle U-100 0.3 mL 29 gauge x 1/2 Syringe     Daily    methocarbamoL  (ROBAXIN ) 500 mg Oral Tablet    Take 1 Tablet (500 mg total) by mouth Four times a day as needed for Other (Muscle spasms) for up to 5 days    naproxen  (NAPROSYN ) 500 mg Oral Tablet    Take 1 Tablet (500 mg total) by mouth Twice daily with food    NURTEC ODT 75 mg Oral Tablet, Rapid Dissolve    Place 1 Tablet (75 mg total) under the tongue Every 24 hours as needed    ondansetron  (ZOFRAN  ODT) 4 mg Oral Tablet, Rapid Dissolve    Take 1 Tablet (4 mg total) by mouth Four times a day as needed    ramipriL (ALTACE) 10 mg Oral Capsule    Take 1 Capsule (10 mg total) by mouth Daily    simvastatin (ZOCOR) 10 mg Oral Tablet    SMARTSIG:1 Tablet(s) By Mouth Every Evening    Patient not taking:  Reported on 04/01/2024    TOUJEO  MAX U-300 SOLOSTAR 300 unit/mL (3 mL) Subcutaneous Insulin   Pen    Inject 55 Units under the skin Daily    VENTOLIN  HFA 90 mcg/actuation Inhalation oral inhaler    Take 2 Puffs by inhalation Every 4 hours as needed          Allergies[1]       Vitals:  Temperature: 36.8 C (98.3 F)  Heart Rate: 76  Respiratory Rate: 18  BP (Non-Invasive): 131/84  SpO2: 98 %    Objective:  General Appearance:  Comfortable, well-appearing and in no acute distress.    Vital signs: (most recent): Blood pressure 131/84, pulse 76, temperature 36.8 C (98.3 F), resp. rate 18, height 1.575 m (5' 2.01), weight 53.7 kg (118 lb 5 oz), SpO2 98%.  Vital signs are normal.  No fever.    Lungs:  Normal effort and normal respiratory rate.  Breath sounds clear to auscultation.    Heart: Normal rate.  Regular rhythm.  S1 normal and S2 normal.    Abdomen: Abdomen is soft.  Bowel sounds are normal.   There is no abdominal tenderness.     Extremities: Normal range of motion.    Pulses: Distal pulses are intact.    Neurological: Patient is alert and oriented to person, place and time.    Skin:  Warm and dry.        Active Hospital Problems   (*Primary Problem)    Diagnosis    *DKA (diabetic ketoacidosis)     Dehydration    Hypertension, unspecified type    Pain, dental       Labs:     Results for orders placed or performed during the hospital encounter of 03/31/24 (from the past 24 hours)   POC BLOOD GLUCOSE (RESULTS)   Result Value Ref Range    GLUCOSE, POC 216 (H) 70 - 100 mg/dl   TROPONIN-I   Result Value Ref Range    TROPONIN I 6 <15 ng/L   POC BLOOD GLUCOSE (RESULTS)   Result Value Ref Range    GLUCOSE, POC 222 (H) 70 - 100 mg/dl   COMPREHENSIVE METABOLIC PANEL, NON-FASTING   Result Value Ref Range    SODIUM 142 136 - 145 mmol/L    POTASSIUM 3.0 (L) 3.5 - 5.1 mmol/L    CHLORIDE 106 98 - 107 mmol/L    CO2 TOTAL 28 21 - 31 mmol/L    ANION GAP 8 4 - 13 mmol/L    BUN 6 (L) 7 - 25 mg/dL    CREATININE 9.68 (L) 0.60 - 1.30 mg/dL    BUN/CREA RATIO 19 6 - 22    ESTIMATED GFR 123 >59 mL/min/1.51m^2    ALBUMIN 3.5 3.5 - 5.7 g/dL    CALCIUM 8.6 8.6 - 89.6 mg/dL    GLUCOSE 832 (H) 74 - 109 mg/dL    ALKALINE PHOSPHATASE 79 34 - 104 U/L    ALT (SGPT) 12 7 - 52 U/L    AST (SGOT) 18 13 - 39 U/L    BILIRUBIN TOTAL 0.7 0.3 - 1.0 mg/dL    PROTEIN TOTAL 6.2 (L) 6.4 - 8.9 g/dL    ALBUMIN/GLOBULIN RATIO 1.3 0.8 - 1.4    OSMOLALITY, CALCULATED 285 270 - 290 mOsm/kg    CALCIUM, CORRECTED 9.0 8.9 - 10.8 mg/dL    GLOBULIN 2.7 2.0 - 3.5   MAGNESIUM    Result Value Ref Range    MAGNESIUM  1.8 (L) 1.9 - 2.7 mg/dL   PHOSPHORUS   Result Value Ref Range    PHOSPHORUS 4.0 3.7 - 7.2  mg/dL   CBC WITH DIFF   Result Value Ref Range    WBC 6.0 3.8 - 11.8 x10^3/uL    RBC 4.15 3.63 - 4.92 x10^6/uL    HGB 13.2 10.9 - 14.3 g/dL    HCT 62.1 68.7 - 58.0 %    MCV 90.9 75.5 - 95.3 fL    MCH 31.8 24.7 - 32.8 pg    MCHC 34.9 32.3 - 35.6 g/dL    RDW 86.3 87.6 - 82.2 %    PLATELETS 214 140 - 440 x10^3/uL    MPV 7.9 7.9 - 10.8 fL    NEUTROPHIL % 55 43 - 77 %    LYMPHOCYTE % 33 16 - 46 %    MONOCYTE % 8 4 - 11 %    EOSINOPHIL % 2 1 - 7 %    BASOPHIL % 1 0 - 1 %    NEUTROPHIL # 3.30 1.90 - 8.20 x10^3/uL    LYMPHOCYTE # 2.00 1.10 - 3.10 x10^3/uL    MONOCYTE # 0.50 0.20 -  0.90 x10^3/uL    EOSINOPHIL # 0.10 0.00 - 0.50 x10^3/uL    BASOPHIL # 0.10 0.00 - 0.10 x10^3/uL   POC BLOOD GLUCOSE (RESULTS)   Result Value Ref Range    GLUCOSE, POC 207 (H) 70 - 100 mg/dl   POC BLOOD GLUCOSE (RESULTS)   Result Value Ref Range    GLUCOSE, POC 242 (H) 70 - 100 mg/dl       Imaging Studies:    Results for orders placed or performed during the hospital encounter of 03/31/24   CT ABDOMEN PELVIS WO IV CONTRAST     Status: None    Narrative    Shirlee L Fein    RADIOLOGIST: Kelby Frame    CT ABDOMEN PELVIS WO IV CONTRAST performed on 03/31/2024 7:40 PM    CLINICAL HISTORY: upper abd pain burning nv.  N/V, PT STATES SHE HAD BLOOD IN VOMIT, HX OF GB REMOVAL/CSECTION    TECHNIQUE:  Abdomen and pelvis CT without intravenous contrast.    COMPARISON:  March 16, 2023 CT scan    FINDINGS:  Noncontrast technique limits evaluation of the abdominal and pelvic viscera.    Lung bases: Anteriorly over the lower xiphoid area there is a questionable small lipoma versus asymmetric fat in this area. There is no consolidation in the lower chest or acute pleural disease identified.    Liver:   Overall stable contour, caliber, and density of the liver. Scattered calcifications are nonspecific.    Gallbladder:   Surgically absent    Spleen:   Unremarkable.    Pancreas:   No definite CT evidence of acute pancreatitis, correlate with lipase testing if clinically concern for this diagnosis    Adrenals:   Stable    Kidneys:   No obstructing stones or hydronephrosis. 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 Similar to the prior study    Bladder:  Unremarkable.    Uterus and Adnexa:  Unremarkable.    Bowel:   Assessment of the gastric wall is suboptimal due to incomplete distention. Small amount of fluid at the distal esophagus could indicate reflux. Correlate for symptoms of reflux esophagitis. No dilated small bowel loops to indicate mechanical obstruction. Scattered  diverticula are seen involving the colon. Mild to moderate colonic stool retention. No significant focal pericolonic mesenteric inflammatory disease    Appendix:  Normal.    Lymph nodes:  No suspicious lymph node enlargement.    Vasculature:   Scattered calcified wall plaque at the aorta and major branches, no aortic aneurysm     Peritoneum / Retroperitoneum: No ascites.  No free air.    Bones:   Degenerative changes of the spine with similar vertebral contours compared to the prior exam          Impression    No mesenteric inflammation, free fluid or free air  Limited gastric wall assessment due to incomplete distention. Possible reflux in the distal esophagus. Consider endoscopy if continued clinical concern for this area.  Incidental findings as detailed above      Radiologist location ID: [id number]     XR CHEST AP     Status: None    Narrative    Karleigh L Matkins    RADIOLOGIST: Redell Bran    XR CHEST AP performed on 03/31/2024 7:18 PM    CLINICAL HISTORY: cardiac eval  VOMITTING. HX OF BILATERAL BREAST CANCER WITH BILATERAL MASTECTOMYS.    TECHNIQUE: Frontal view of the chest.    COMPARISON:  01/04/2024    FINDINGS:    Cardiac and mediastinal contours are stable.   There are chronic-appearing changes of both lungs.           Impression    NO ACUTE FINDINGS.        Radiologist location ID: [id number]     XR AP MOBILE CHEST     Status: None    Narrative    Nickayla L Mcilvaine    RADIOLOGIST: Redell Schambach    XR AP MOBILE CHEST performed on 04/02/2024 4:06 PM    CLINICAL HISTORY: Chest Pain  ap port, sob    TECHNIQUE: Frontal view of the chest.    COMPARISON:  12/08/2023    FINDINGS:    The heart size is normal.   There are chronic-appearing changes of both lungs.           Impression    NO ACUTE FINDINGS.        Radiologist location ID: [id number]               Assessment/Plan:    Dysphagia  GERD    Continue current management and supportive care. EGD yesterday showed mild distal esophagitis, small hiatal  hernia, mild gastritis. GE junction and esophageal inlet dilated to 16.47mm. Continue Protonix  40 mg po daily and famotidine  40 mg in am. FU in office in 2-3 weeks. Stable for discharge from GI standpoint. Will continue to follow. Further recommendations based on clinical course. Patient discussed in detail with Dr MARLA Blanch and treatment plan decided by him.     Suzen Jenkins Signs, CFNP        [1]   Allergies  Allergen Reactions    Dulaglutide  Other Adverse Reaction (Add comment) and Diarrhea     Loss of appetite    Metformin  Other Adverse Reaction (Add comment) and Diarrhea     Dehydrates me    Tramadol  Other Adverse Reaction (Add comment)     Tachycardia    Other Reaction(s): Angioedema, Not available, tachycardia

## 2024-04-03 NOTE — Progress Notes (Signed)
 [facility]     DISCHARGE SUMMARY      PATIENT NAME:  Gail Morris   MRN:  [id number]  DOB:  18-Jul-1966    INPATIENT ADMISSION DATE: 03/31/2024   DATE OF DISCHARGE:  04/03/24     ATTENDING PHYSICIAN:  Claudene Krystal LABOR, DO    HOSPITAL PRESENTATION:  Please see full admission H&P for details.    As per HPI:  Gail Morris 58 y.o. female      Patient states that a few days ago she had her teeth pulled and ever since the procedure she has had nausea vomiting.  She claims that she has had some dry heaves, even though initially she had a little bit of blood mixed with the vomit.  She is not feeling well at all and decided to come to [facility] the ER to be evaluated.  She denies any fever chest pain abdominal pain.  Patient here to [facility] was found to have elevated beta hydroxybutyrate and slightly elevated anion gap placed on an insulin  drip there and patient improves the point where anion gap closed.  She was sent here for continued and med.    FURTHER HOSPITAL COURSE:  Patient was admitted with DKA that quickly resolved, episode of a-fib reported per EMS-has been sinus here, dysphagia, hematemesis, and other chronic conditions. Patient was placed on Lantus  and insulin  sliding scale coverage. Started on Protonix  and GI consulted. Bowel regimen ordered for constipation, which resolved. Dr. MARLA Blanch performed EGD 04/02/24 showing mild distal esophagitis grade 1, small hiatal hernia, mild gastritis, GE junction and esophageal inlet dilated to 16.5 mm as described. Recommended to continue Protonix  40 mg daily, Pepcid  40 mg daily, antireflux precautions, and await results of biopsies. Patient's electrolytes replaced as indicated. Patient reports she is able to swallow liquids, medicines, and food much better today after procedure. Is very pleased with results. Patient has been cleared from GI standpoint for discharge home f/u 2-3 weeks. Patient is asking to go home. Repeat potassium level this  afternoon up to 4. Patient to be discharged home with family. Repeat bmpl on Monday with results to PCP. Will resume home jardiance as well as home Toujeo . However, decrease toujeo  dose to 25 units. Patient to monitor blood glucose at home and f/u with PCP for further titration for diabetes control. Follow up with PCP in 1 week.     PROBLEM LIST:  Active Hospital Problems    Diagnosis Date Noted    Principal Problem: DKA (diabetic ketoacidosis) [E11.10] 04/01/2024    Dehydration [E86.0] 04/02/2024    Hypertension, unspecified type [I10] 04/02/2024    Pain, dental [K08.89] 04/02/2024      Resolved Hospital Problems   No resolved problems to display.     Active Non-Hospital Problems    Diagnosis Date Noted    Hypotension 12/09/2023    Acute low back pain 12/09/2023    Acute pain of right shoulder 12/09/2023    Chest pain 12/09/2023    Tobacco abuse 12/09/2023     Nutrition:    DIET REGULAR Do you want to initiate MNT Protocol? Yes; Calorie amount: CC 1600; Additional modifications/limitations: GI/SOFT; Solids Dysphagia Modifications: DYSPHAGIA ADVANCED (Soft & Bite-Sized)    Additional clinical characteristics related to nutrition:    - monitor for weight changes   - monitor intake and output    - monitor bowel functions     Nutrition Treatment Plan: Advance diet as tolerated to goal  PHYSICAL EXAM DAY OF DISCHARGE:  BP 131/84   Pulse 76   Temp 36.8 C (98.3 F)   Resp 18   Ht 1.575 m (5' 2.01)   Wt 53.7 kg (118 lb 5 oz)   SpO2 98%   BMI 21.63 kg/m      Physical Exam  Constitutional:       General: She is not in acute distress.  HENT:      Mouth/Throat:      Comments: No abscess or pus noted along lower gum line at this time  Cardiovascular:      Rate and Rhythm: Normal rate and regular rhythm.   Pulmonary:      Effort: Pulmonary effort is normal. No respiratory distress.      Breath sounds: Normal breath sounds. No wheezing.   Abdominal:      General: Bowel sounds are normal. There is no distension.       Palpations: Abdomen is soft.      Tenderness: There is no abdominal tenderness.   Musculoskeletal:         General: No swelling.   Skin:     General: Skin is warm and dry.   Neurological:      General: No focal deficit present.      Mental Status: She is alert.       LABS WITHIN LAST 24 HOURS:   Results for orders placed or performed during the hospital encounter of 03/31/24 (from the past 24 hours)   POC BLOOD GLUCOSE (RESULTS)   Result Value Ref Range    GLUCOSE, POC 216 (H) 70 - 100 mg/dl   TROPONIN-I   Result Value Ref Range    TROPONIN I 6 <15 ng/L   POC BLOOD GLUCOSE (RESULTS)   Result Value Ref Range    GLUCOSE, POC 222 (H) 70 - 100 mg/dl   COMPREHENSIVE METABOLIC PANEL, NON-FASTING   Result Value Ref Range    SODIUM 142 136 - 145 mmol/L    POTASSIUM 3.0 (L) 3.5 - 5.1 mmol/L    CHLORIDE 106 98 - 107 mmol/L    CO2 TOTAL 28 21 - 31 mmol/L    ANION GAP 8 4 - 13 mmol/L    BUN 6 (L) 7 - 25 mg/dL    CREATININE 9.68 (L) 0.60 - 1.30 mg/dL    BUN/CREA RATIO 19 6 - 22    ESTIMATED GFR 123 >59 mL/min/1.54m^2    ALBUMIN 3.5 3.5 - 5.7 g/dL    CALCIUM 8.6 8.6 - 89.6 mg/dL    GLUCOSE 832 (H) 74 - 109 mg/dL    ALKALINE PHOSPHATASE 79 34 - 104 U/L    ALT (SGPT) 12 7 - 52 U/L    AST (SGOT) 18 13 - 39 U/L    BILIRUBIN TOTAL 0.7 0.3 - 1.0 mg/dL    PROTEIN TOTAL 6.2 (L) 6.4 - 8.9 g/dL    ALBUMIN/GLOBULIN RATIO 1.3 0.8 - 1.4    OSMOLALITY, CALCULATED 285 270 - 290 mOsm/kg    CALCIUM, CORRECTED 9.0 8.9 - 10.8 mg/dL    GLOBULIN 2.7 2.0 - 3.5   MAGNESIUM    Result Value Ref Range    MAGNESIUM  1.8 (L) 1.9 - 2.7 mg/dL   PHOSPHORUS   Result Value Ref Range    PHOSPHORUS 4.0 3.7 - 7.2 mg/dL   CBC WITH DIFF   Result Value Ref Range    WBC 6.0 3.8 - 11.8 x10^3/uL    RBC 4.15 3.63 - 4.92 x10^6/uL  HGB 13.2 10.9 - 14.3 g/dL    HCT 62.1 68.7 - 58.0 %    MCV 90.9 75.5 - 95.3 fL    MCH 31.8 24.7 - 32.8 pg    MCHC 34.9 32.3 - 35.6 g/dL    RDW 86.3 87.6 - 82.2 %    PLATELETS 214 140 - 440 x10^3/uL    MPV 7.9 7.9 - 10.8 fL    NEUTROPHIL % 55 43 -  77 %    LYMPHOCYTE % 33 16 - 46 %    MONOCYTE % 8 4 - 11 %    EOSINOPHIL % 2 1 - 7 %    BASOPHIL % 1 0 - 1 %    NEUTROPHIL # 3.30 1.90 - 8.20 x10^3/uL    LYMPHOCYTE # 2.00 1.10 - 3.10 x10^3/uL    MONOCYTE # 0.50 0.20 - 0.90 x10^3/uL    EOSINOPHIL # 0.10 0.00 - 0.50 x10^3/uL    BASOPHIL # 0.10 0.00 - 0.10 x10^3/uL   POC BLOOD GLUCOSE (RESULTS)   Result Value Ref Range    GLUCOSE, POC 207 (H) 70 - 100 mg/dl   POC BLOOD GLUCOSE (RESULTS)   Result Value Ref Range    GLUCOSE, POC 242 (H) 70 - 100 mg/dl        IMAGING WITHIN LAST 24 HOURS:   No results found.  XR AP MOBILE CHEST   Final Result   NO ACUTE FINDINGS.            Radiologist location ID: [id number]         CT ABDOMEN PELVIS WO IV CONTRAST   Final Result   No mesenteric inflammation, free fluid or free air   Limited gastric wall assessment due to incomplete distention. Possible reflux in the distal esophagus. Consider endoscopy if continued clinical concern for this area.   Incidental findings as detailed above         Radiologist location ID: [id number]         XR CHEST AP   Final Result   NO ACUTE FINDINGS.            Radiologist location ID: [id number]              MICROBIOLOGY WITHIN LAST 24 HOURS:   No results found for any visits on 03/31/24 (from the past 24 hours).     DISCHARGE MEDICATIONS:     Current Discharge Medication List        START taking these medications.        Details   famotidine  40 mg Tablet  Commonly known as: PEPCID    40 mg, Oral, 2 TIMES DAILY  Qty: 60 Tablet  Refills: 0     folic acid  1 mg Tablet  Commonly known as: FOLVITE   Start taking on: April 04, 2024   1 mg, Oral, Daily  Qty: 30 Tablet  Refills: 0     pantoprazole  40 mg Tablet, Delayed Release (E.C.)  Commonly known as: PROTONIX   Start taking on: April 04, 2024   40 mg, Oral, DAILY  Qty: 30 Tablet  Refills: 0     polyethylene glycol 17 gram Powder in Packet  Commonly known as: MIRALAX   Start taking on: April 04, 2024   17 g, Oral, Daily  Qty: 30 Packet  Refills: 0      sennosides-docusate sodium  8.6-50 mg Tablet  Commonly known as: SENOKOT-S   1 Tablet, Oral, 2 TIMES DAILY PRN  Qty: 60 Tablet  Refills: 0  CONTINUE these medications - NO CHANGES were made during your visit.        Details   Accu-Chek Guide Me Glucose Mtr Misc  Generic drug: Blood-Glucose Meter   4 TIMES DAILY  Refills: 0     Accu-Chek Guide test strips Strip  Generic drug: Blood Sugar Diagnostic   2 TIMES DAILY  Refills: 0     Accu-Chek Softclix Lancets Misc  Generic drug: Lancets   2 TIMES DAILY  Refills: 0     acetaminophen  500 mg Tablet  Commonly known as: TYLENOL    1,000 mg, Oral, EVERY 6 HOURS PRN  Qty: 60 Tablet  Refills: 0     aspirin  81 mg Tablet, Delayed Release (E.C.)  Commonly known as: ECOTRIN   81 mg, Daily  Refills: 0     BD UF Mini Pen Needle 31 gauge x 3/16 Needle  Generic drug: Pen Needle (Disposable)   SMARTSIG:SUB-Q  Refills: 0     empagliflozin 25 mg Tablet  Commonly known as: JARDIANCE   25 mg, Oral, Daily  Refills: 0     ergocalciferol (vitamin D2) 1,250 mcg (50,000 unit) Capsule  Commonly known as: DRISDOL   50,000 Units, EVERY 7 DAYS  Refills: 0     FreeStyle Libre 14 Day Reader Misc  Generic drug: flash glucose scanning reader   USE TO CHECK GLUCOSE AS DIRECTED 4 TIMES DAILY  Refills: 0     * gabapentin  800 mg Tablet  Commonly known as: NEURONTIN    800 mg, 3 TIMES DAILY  Refills: 0     * gabapentin  100 mg Capsule  Commonly known as: NEURONTIN    100 mg, 3 TIMES DAILY  Refills: 0     Insulin  Syringe-Needle U-100 0.3 mL 29 gauge x 1/2 Syringe   Daily  Refills: 0     Nurtec ODT 75 mg Tablet, Rapid Dissolve  Generic drug: rimegepant   75 mg, EVERY 24 HOURS PRN  Refills: 0     ondansetron  4 mg Tablet, Rapid Dissolve  Commonly known as: ZOFRAN  ODT   4 mg, 4 TIMES DAILY PRN  Refills: 0     ramipriL 10 mg Capsule  Commonly known as: ALTACE   10 mg, Oral, Daily  Refills: 0     simvastatin 10 mg Tablet  Commonly known as: ZOCOR   SMARTSIG:1 Tablet(s) By Mouth Every Evening  Refills: 0      Toujeo  Max U-300 SoloStar 300 unit/mL (3 mL) Insulin  Pen  Generic drug: insulin  glargine U-300 conc   55 Units, Subcutaneous, Daily  Refills: 0     Ventolin  HFA 90 mcg/actuation oral inhaler  Generic drug: albuterol  sulfate   2 Puffs, EVERY 4 HOURS PRN  Refills: 0           * This list has 2 medication(s) that are the same as other medications prescribed for you. Read the directions carefully, and ask your doctor or other care provider to review them with you.                STOP taking these medications.      fluticasone propionate 50 mcg/actuation Spray, Suspension  Commonly known as: FLONASE     hydrOXYzine HCL 50 mg Tablet  Commonly known as: ATARAX     methocarbamoL  500 mg Tablet  Commonly known as: ROBAXIN      naproxen  500 mg Tablet  Commonly known as: NAPROSYN              DISCHARGE DISPOSITION:  home  with family    DISCHARGE INSTRUCTIONS:  No discharge procedures on file.     Copies sent to Care Team         Relationship Specialty Notifications Start End    Peters, Jana, DO PCP - General FAMILY MEDICINE  12/28/21     Phone: [phone] Fax: [phone]         [address] [place] [address]            The hospitalist examined patient, reviewed material, and agreed with discharge at this time.   >30 minutes total were spent coordinating discharge day today    Duwaine Shropshire, FNP-BC  [facility] MEDICINE HOSPITALIST

## 2024-04-05 DIAGNOSIS — K2950 Unspecified chronic gastritis without bleeding: Secondary | ICD-10-CM

## 2024-04-23 ENCOUNTER — Other Ambulatory Visit (HOSPITAL_COMMUNITY): Payer: Self-pay | Admitting: Family Medicine

## 2024-04-23 DIAGNOSIS — M25511 Pain in right shoulder: Secondary | ICD-10-CM

## 2024-04-26 ENCOUNTER — Other Ambulatory Visit: Payer: Self-pay

## 2024-04-26 ENCOUNTER — Inpatient Hospital Stay (HOSPITAL_COMMUNITY)
Admission: RE | Admit: 2024-04-26 | Discharge: 2024-04-26 | Disposition: A | Discharge status: Still In Hospital | Payer: MEDICAID | Source: Ambulatory Visit | Attending: Family Medicine

## 2024-04-26 DIAGNOSIS — M25511 Pain in right shoulder: Secondary | ICD-10-CM | POA: Insufficient documentation

## 2024-04-26 NOTE — PT Evaluation (Signed)
 [facility] Medicine [facility]  Outpatient Physical Therapy  [address]  [place], [address]  [phone]  (Fax) [phone]       Physical Therapy Upper Extremity Evaluation    Date: 04/26/2024  Patient's Name: Gail Morris  Date of Birth: 01/19/1966  Physical Therapy Evaluation      Evaluating Physical Therapist: Damien Parcel, PT, DPT  PT diagnosis/Reason for Referral: R shoulder pain  Next Scheduled Physician Appointment: PRN  Allergies/Contraindications: hx BCA, aneurysm B frontal lobes, h/o blood clots, DMII, occasional falls             SUBJECTIVE  Date of onset: 02/01/2024    Mechanism of injury: none     Current Presentation: Pt presents with chronic R shoulder pain which has been intensifying since April without clear MOI. She does have PMH sig for B reconstructive sx s/p B mastectomy, and she feels R shoulder has been tight, painful, and restricted since then.     PLOF: IND, but disabled d/t BCA and DMII    Previous episodes/treatments: none    Past Medical History:   Past Medical History:   Diagnosis Date    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    Anxiety     Breast cancer     Cancer (CMS HCC)     Depression     Diabetes mellitus, type 2     Esophageal reflux     H/O blood clots     H/O foot surgery     Hiccups     HTN (hypertension)     Migraine     Neuropathy (CMS HCC)     Primary generalized (osteo)arthritis    - back muscle on R was transplanted to R chest during reconstruction, but it did not take. She has had some limited use of R shoulder since then.       Past Surgical History:   Past Surgical History:   Procedure Laterality Date    HAND SURGERY      HX BACK SURGERY      HX BREAST RECONSTRUCTION      HX CESAREAN SECTION      HX CHOLECYSTECTOMY      SHOULDER ADHESION RELEASE Left     SIMPLE MASTECTOMY Bilateral        Medications for this problem: pain medication    Diagnostic tests: 02/05/2024 R shoulder x-ray IMPRESSION:  DEGENERATIVE  OSTEOARTHROSIS. NO ACUTE FINDINGS.     Patient goals: REDUCE PAIN and NORMALIZE FUNCTION    Occupation: DISABLED    Pain location: R shoulder                    Pain description: SHARP and ACHING    Pain frequency:  INTERMITTENT    Pain rating: Now 6   Best 0   Worst 10    Radiculopathy: R neck, R shoulder    Pain increases with: ADLs and ACTIVITY           decreases with : luck    Sensation: intact to LT    Weakness: yes R UE    Sleep affected: yes unable to sleep on R    Headaches: yes - 1-2x/mo (managed with meds from neurologist)    Dizziness: none    Subjective Functional Reports:    Sitting: WFL    Standing: WFL    Walking: WFL    Lifting: LIMITED    Patient-Specific Functional Score:  Problem Score   1. Doing dishes 1   2. Laying on R side 1   3. Washing hair 1   TOTAL 1   Total score = sum of the activity scores/number of activities    Minimal detectable change (90% CI) for avg score = 2 points    Minimal detectable change (90% CI) for single activity score = 3 points       OBJECTIVE    Shoulder AROM/AAROM   right left   Flexion 110*/120* 115   Extension 30* 50   Abduction 75* 110   ER R ear C3   IR R hip L1       Elbow AROM  WFL    Cervical flexion 50 deg, ext 0 deg, RSB 25* LSB 35* RROT 45 LROT 40     ROM comment Pain with R shoulder flexion, ext, ER, IR    Pt is RHD    Strength (Manual Muscle Testing per [employer] Muscle Grading system) *tested in neutral     right left   Shoulder flexion 3 4   Shoulder abduction  3 4   Shoulder IR 3 4   Shoulder ER 3 4   Elbow flexion  3 4   Elbow extension  3 4   grip 40# 54#     Strength comment pain with all R shoulder MMTs    Joint mobility hypomobility R GH post and inf    Palpation: extreme TTP at R anterior GH    Posture: FWH, RSH    Cervical screening:VBI negative    Treatment provided:REVIEW OF POC AND GOALS WITH PATIENT, ALL QUESTIONS ANSWERED, PATIENT EDUCATION, and THERAPEUTIC EXERCISE     HEP Access Code: pulleys 2-3 x per day for 1-2  min    EXERCISE/ACTIVITY NAME REPETITIONS RESISTANCE COMPLETED THIS DOS   Pulleys   5'  y   Postural education   5'  y   KT tape 3' 2 strips R shoulder support y   Seated scap squeeze     n   TEOB   n   Supine wand flexion   n   Supine hor abd/add  Yellow TB n   MFR UT, LS, rhomboids, R post GH 10' gentle Y              ASSESSMENT    Impression: Pt presents with chronic R shoulder pain accompanied by decreased R shoulder strength and ROM, compensatory posture, and decreased scapular strength and stability. Pain is complicated by B mastectomy and attempted reconstruction 10+ years ago which have resulted in severe soft tissue restrictions in the the anterior chest as well as R shoulder. Aforementioned impairments are interfering with her ability to use her dominant hand to reach, push, pull, lift, carry, dress, wash her hair, and sleep through the night. She will benefit from skilled PT services to reduce pain and improve strength and mobility in order to optimize her participation in ADLs and recreational activities.     Rehab potential: GOOD and GUARDED    Short-Term Goals 4 Weeks:   - Patient will be independent with progressive HEP to maximize gains from PT.   - Patient will report less than max pain of 5/10 to aid with participation in physical therapy.   - Patient will exhibit proximal stability of the involved extremity to [facility] to permit normal posturing of the UE at rest.   - Patient will demonstrate improved R shoulder AROM to at least 115 degrees without pain  to aid in functional reaching.     Long-Term Goals: 8 Weeks:   - Patient will improve functional ability with Patient Specific Functional Scale score of at least 6.   - Patient will demonstrate improved R shoulder AROM to pain free and equal to L UE to aid in ability to perform ADLs independently.   - Patient will demonstrate improved R shoulder strength to at least 4/5 throughout to aid in return to previous level of function.   - Patient will  demonstrate improved grip strength with R UE to at least 50 pounds of pressure to aid in completion of IADLs.   - Patient will report sleep not disrupted by shoulder pain to allow participation of functional ADLs during the day.           PLAN  Patient will attend 2 times per week x 8 weeks. Therapy may include, but is not limited to THERAPEUTIC EXERCISES, MYOFASCIAL/JOINT MOBILIZATION, POSTURE/BODY MECHANICS, ERGONOMIC TRAINING, TRANSFER/GAIT TRAINING, HOME INSTRUCTIONS, HEAT/COLD, KINESIOTAPE, and DRY NEEDLING    Plan for next visit: progress mobility, posture, strength while trying to avoid high stress to soft tissues    Evaluation complexity:   Personal factors impacting POC: FREQUENT OR CHRONIC PAIN   Co-morbidities impacting POC: PREVIOUS SURGERIES  Complexity of physical exam: INCLUDING MUSCULOSKELETAL SYSTEM (POSTURE, ROM, STRENGTH, HEIGHT/WEIGHT) and INCLUDING ACTIVITY/MOBILITY RESTRICTIONS   Clinical Presentation: STABLE   Evaluation Complexity: LOW-HISTORY 0, EXAMINATION 1-2, STABLE PRESENTATION      Total Session Time 55, Timed code minutes 25, and Untimed code minutes 30       Intervention minutes: EVALUATION 30 minutes, THERAPEUTIC EXERCISE 15 minutes, and JOINT MOBILIZATION/MFR 10 minutes    Damien Parcel, PT  04/26/2024, 08:46

## 2024-04-28 ENCOUNTER — Ambulatory Visit (HOSPITAL_COMMUNITY)
Admission: RE | Admit: 2024-04-28 | Discharge: 2024-04-28 | Disposition: A | Discharge status: Still In Hospital | Payer: MEDICAID | Source: Ambulatory Visit

## 2024-04-28 ENCOUNTER — Other Ambulatory Visit: Payer: Self-pay

## 2024-04-28 NOTE — PT Treatment (Signed)
 [facility] Medicine [facility]  Outpatient Physical Therapy  [address]  [place], [address]  [phone]  (Fax) [phone]    Physical Therapy Treatment Note    Date: 04/28/2024  Patient's Name: Gail Morris  Date of Birth: 10-Nov-1965  Physical Therapy Visit    Visit #/POC: 2/16  Authorization: 20 cy then pa  POC Ends: 06/21/2024  Order Ends: open  Next Progress Note Due: visit 8 or 8/12      Evaluating Physical Therapist: Damien Parcel, PT, DPT  PT diagnosis/Reason for Referral: R shoulder pain  Next Scheduled Physician Appointment: PRN  Allergies/Contraindications: hx BCA, aneurysm B frontal lobes, h/o blood clots, DMII, occasional falls         Subjective: Pt arrives reporting no major change with tape at IE. She has 4/10 pain in R shoulder upon arrival.    Objective:     AAROM flexion 120 deg with pain 7/16    EXERCISE/ACTIVITY NAME REPETITIONS RESISTANCE COMPLETED THIS DOS   Pulleys 2'/2'  y   Pulleys    30x   y   Postural education    5'   y   KT tape 3' 2 strips R shoulder support n   Seated scap squeeze     10x   y   TEOB  10x   y   Supine wand flexion  10x   y   Supine hor abd/add   Yellow TB n   MFR UT, LS, rhomboids, R post GH 10' gentle Y    Manual: post and inf GH mobs gr 3 with towel under palm X 20 ea  y           DISCONTINUED ACTIVITIES       Access Code: [email or web address]  URL: [email or web address]  Date: 04/28/2024  Prepared by: Damien Parcel    Exercises  - Seated Scapular Retraction  - 1 x daily - 7 x weekly - 2 sets - 10 reps  - Seated Thoracic Lumbar Extension  - 1 x daily - 7 x weekly - 2 sets - 10 reps    Assessment: Pt has good tolerance to all activities, but her R shoulder did get more sore during PT. She reports pain at 6/10 after session.     Short-Term Goals 4 Weeks:   - Patient will be independent with progressive HEP to maximize gains from PT.   - Patient will report less than max pain of 5/10 to aid with participation in physical therapy.   - Patient  will exhibit proximal stability of the involved extremity to [facility] to permit normal posturing of the UE at rest.   - Patient will demonstrate improved R shoulder AROM to at least 115 degrees without pain to aid in functional reaching.     Long-Term Goals: 8 Weeks:   - Patient will improve functional ability with Patient Specific Functional Scale score of at least 6.   - Patient will demonstrate improved R shoulder AROM to pain free and equal to L UE to aid in ability to perform ADLs independently.   - Patient will demonstrate improved R shoulder strength to at least 4/5 throughout to aid in return to previous level of function.   - Patient will demonstrate improved grip strength with R UE to at least 50 pounds of pressure to aid in completion of IADLs.   - Patient will report sleep not disrupted by shoulder pain to allow participation of functional ADLs during the day.  Plan: assess response to treatment and progress as able    Total Session Time 33 and Timed code minutes 33  THERAPEUTIC EXERCISE 18 minutes and JOINT MOBILIZATION/MFR 15 minutes      Damien Parcel, PT  04/28/2024, 08:40

## 2024-05-04 ENCOUNTER — Ambulatory Visit (HOSPITAL_COMMUNITY)
Admission: RE | Admit: 2024-05-04 | Discharge: 2024-05-04 | Disposition: A | Discharge status: Still In Hospital | Payer: MEDICAID | Source: Ambulatory Visit

## 2024-05-04 NOTE — PT Treatment (Signed)
 [facility] Medicine [facility]  Outpatient Physical Therapy  [address]  [place], [address]  [phone]  (Fax) [phone]    Physical Therapy Treatment Note    Date: 05/04/2024  Patient's Name: Gail Morris  Date of Birth: 28-Oct-1965  Physical Therapy Visit    Visit #/POC: 3/16  Authorization: 20 cy then pa  POC Ends: 06/21/2024  Order Ends: open  Next Progress Note Due: visit 8 or 8/12       Evaluating Physical Therapist: Damien Parcel, PT, DPT  PT diagnosis/Reason for Referral: R shoulder pain  Next Scheduled Physician Appointment: PRN  Allergies/Contraindications: hx BCA, aneurysm B frontal lobes, h/o blood clots, DMII, occasional falls     Subjective:  Pt states she over worked this weekend.  Notes she helped her sister carrying groceries.  States she is doing exercises at home as advised.  Rates pain 6/10 today     Objective:  Activities as noted below      EXERCISE/ACTIVITY NAME REPETITIONS RESISTANCE COMPLETED THIS DOS   Pulleys 2'/2'   y   Pulleys    30x   y   Postural education    5'   y   KT tape 3' 2 strips R shoulder support  Y    Seated scap squeeze     10x   y   TEOB  10x    n   Supine wand flexion  10x    n   Supine hor abd/add   Yellow TB n   MFR UT, LS, rhomboids, R post GH 10' gentle Y    Manual: post and inf GH mobs gr 3 with towel under palm X 20 ea   y            DISCONTINUED ACTIVITIES           Assessment:  Pt tolerated treatment well.  Noted some relief of soreness at end of session.  Continues to be very limited with AROM.  Forward humeral head posturing noted.  She does well on pulley and with AAROM activities.      Short-Term Goals 4 Weeks:   - Patient will be independent with progressive HEP to maximize gains from PT.   - Patient will report less than max pain of 5/10 to aid with participation in physical therapy.   - Patient will exhibit proximal stability of the involved extremity to [facility] to permit normal posturing of the UE at rest.   - Patient will  demonstrate improved R shoulder AROM to at least 115 degrees without pain to aid in functional reaching.     Long-Term Goals: 8 Weeks:   - Patient will improve functional ability with Patient Specific Functional Scale score of at least 6.   - Patient will demonstrate improved R shoulder AROM to pain free and equal to L UE to aid in ability to perform ADLs independently.   - Patient will demonstrate improved R shoulder strength to at least 4/5 throughout to aid in return to previous level of function.   - Patient will demonstrate improved grip strength with R UE to at least 50 pounds of pressure to aid in completion of IADLs.   - Patient will report sleep not disrupted by shoulder pain to allow participation of functional ADLs during the day.       Plan:  Will continue and progress as tolerated     Total Session Time 35 and Timed code minutes 35  THERAPEUTIC EXERCISE 35  minutes      [employer], PTA  05/04/2024, 13:59

## 2024-05-06 ENCOUNTER — Other Ambulatory Visit: Payer: Self-pay

## 2024-05-06 ENCOUNTER — Ambulatory Visit (HOSPITAL_COMMUNITY)
Admission: RE | Admit: 2024-05-06 | Discharge: 2024-05-06 | Disposition: A | Discharge status: Still In Hospital | Payer: MEDICAID | Source: Ambulatory Visit

## 2024-05-06 NOTE — PT Treatment (Signed)
 [facility] Medicine [facility]  Outpatient Physical Therapy  [address]  [place], [address]  [phone]  (Fax) [phone]    Physical Therapy Treatment Note    Date: 05/06/2024  Patient's Name: Gail Morris  Date of Birth: [birth year]  Physical Therapy Visit    Visit #/POC: 4/16  Authorization: 20 cy then pa  POC Ends: 06/21/2024  Order Ends: open  Next Progress Note Due: visit 8 or 8/12       Evaluating Physical Therapist: Damien Parcel, PT, DPT  PT diagnosis/Reason for Referral: R shoulder pain  Next Scheduled Physician Appointment: PRN  Allergies/Contraindications: hx BCA, aneurysm B frontal lobes, h/o blood clots, DMII, occasional falls     Subjective:  Pt states KT tape really helped last time. She was even able to do dishes last night. She arrives with 5/10 pain.     Objective:  Activities as noted below        EXERCISE/ACTIVITY NAME REPETITIONS RESISTANCE COMPLETED THIS DOS   Pulleys    30x   y   Postural education    5'   y   KT tape 3' 2 strips R shoulder support  N- intact   Seated scap squeeze     10x   y   TEOB  10x    y   Supine wand bench press  10x    y   Supine hor abd/add   Yellow TB y   MFR UT, LS, rhomboids, R post GH 10' gentle Y    Manual: post and inf GH mobs gr 3 with towel under palm X 20 ea   y            DISCONTINUED ACTIVITIES            Assessment:  Pt tolerated treatment well.  Noted improvements in R shoulder posturing in sitting and supine positions. She was able to progress shoulder strengthening with supine bench press and supine pull aparts. PROM and AAROM flexion has improved to 130 deg.      Short-Term Goals 4 Weeks:   - Patient will be independent with progressive HEP to maximize gains from PT.   - Patient will report less than max pain of 5/10 to aid with participation in physical therapy.   - Patient will exhibit proximal stability of the involved extremity to [facility] to permit normal posturing of the UE at rest.   - Patient will demonstrate improved R  shoulder AROM to at least 115 degrees without pain to aid in functional reaching.     Long-Term Goals: 8 Weeks:   - Patient will improve functional ability with Patient Specific Functional Scale score of at least 6.   - Patient will demonstrate improved R shoulder AROM to pain free and equal to L UE to aid in ability to perform ADLs independently.   - Patient will demonstrate improved R shoulder strength to at least 4/5 throughout to aid in return to previous level of function.   - Patient will demonstrate improved grip strength with R UE to at least 50 pounds of pressure to aid in completion of IADLs.   - Patient will report sleep not disrupted by shoulder pain to allow participation of functional ADLs during the day.         Plan:  Will continue and progress as tolerated     Total Session Time 39 and Timed code minutes 39  THERAPEUTIC EXERCISE 25 minutes and JOINT MOBILIZATION/MFR 14 minutes  Damien Parcel, PT  05/06/2024, 16:08

## 2024-05-11 ENCOUNTER — Ambulatory Visit
Admission: RE | Admit: 2024-05-11 | Discharge: 2024-05-11 | Disposition: A | Discharge status: Still In Hospital | Payer: MEDICAID | Source: Ambulatory Visit | Attending: Family Medicine

## 2024-05-11 NOTE — PT Treatment (Signed)
 [facility] Medicine [facility]  Outpatient Physical Therapy  [address]  [place], [address]  [phone]  (Fax) [phone]    Physical Therapy Treatment Note    Date: 05/11/2024  Patient's Name: Gail Morris  Date of Birth: 1966/09/24  Physical Therapy Visit    Visit #/POC: 5/16  Authorization: 20 cy then pa  POC Ends: 06/21/2024  Order Ends: open  Next Progress Note Due: visit 8 or 8/12       Evaluating Physical Therapist: Damien Parcel, PT, DPT  PT diagnosis/Reason for Referral: R shoulder pain  Next Scheduled Physician Appointment: PRN  Allergies/Contraindications: hx BCA, aneurysm B frontal lobes, h/o blood clots, DMII, occasional falls     Subjective:  Pt reports her shoulder is sore.  States she has been at camp all week.  Notes she may have over worked.  States she liked the tape and would like to have it put back on.  Rates pain 6/10 today.  States she has not done exercise this week since she was at camp.  She does think that exercises are helping    Objective:  Activities as noted below.      EXERCISE/ACTIVITY NAME REPETITIONS RESISTANCE COMPLETED THIS DOS   Pulleys    30x   y   Postural education    53'   y   KT tape   2 I strips for correction of forward humeral head and protracted scapula  Y    Seated scap squeeze     10x   y   TEOB  10x    y   Supine wand bench press   Supine wand flexion  10x   10x    Y   y   Supine hor abd/add   Yellow TB y   MFR UT, LS, rhomboids, R post GH 10' gentle Y    Manual: post and inf GH mobs gr 3 with towel under palm X 20 ea   y            DISCONTINUED ACTIVITIES           Assessment: Pt tolerated well.  She does note reduced pain after treatment and really thinks tape is helpful.  Pt does seem to have positive response to treatment       Short-Term Goals 4 Weeks:   - Patient will be independent with progressive HEP to maximize gains from PT.   - Patient will report less than max pain of 5/10 to aid with participation in physical therapy.   -  Patient will exhibit proximal stability of the involved extremity to [facility] to permit normal posturing of the UE at rest.   - Patient will demonstrate improved R shoulder AROM to at least 115 degrees without pain to aid in functional reaching.     Long-Term Goals: 8 Weeks:   - Patient will improve functional ability with Patient Specific Functional Scale score of at least 6.   - Patient will demonstrate improved R shoulder AROM to pain free and equal to L UE to aid in ability to perform ADLs independently.   - Patient will demonstrate improved R shoulder strength to at least 4/5 throughout to aid in return to previous level of function.   - Patient will demonstrate improved grip strength with R UE to at least 50 pounds of pressure to aid in completion of IADLs.   - Patient will report sleep not disrupted by shoulder pain to allow participation of functional  ADLs during the day.       Plan:  will progress active program as appropriate    Total Session Time 33 and Timed code minutes 33  THERAPEUTIC EXERCISE 33 minutes      Sokhna Christoph, PTA  05/11/2024, 14:42

## 2024-05-13 ENCOUNTER — Ambulatory Visit (HOSPITAL_COMMUNITY): Payer: Self-pay

## 2024-05-18 ENCOUNTER — Ambulatory Visit (HOSPITAL_COMMUNITY)
Admission: RE | Admit: 2024-05-18 | Discharge: 2024-05-18 | Disposition: A | Discharge status: Still In Hospital | Payer: MEDICAID | Source: Ambulatory Visit | Attending: Family Medicine

## 2024-05-18 DIAGNOSIS — M25511 Pain in right shoulder: Secondary | ICD-10-CM | POA: Insufficient documentation

## 2024-05-18 NOTE — PT Treatment (Signed)
 [facility] Medicine [facility]  Outpatient Physical Therapy  [address]  [place], [address]  [phone]  (Fax) [phone]    Physical Therapy Treatment Note    Date: 05/18/2024  Patient's Name: Gail Morris  Date of Birth: Feb 07, 1966  Physical Therapy Visit    Visit #/POC: 6/16  Authorization: 20 cy then pa  POC Ends: 06/21/2024  Order Ends: open  Next Progress Note Due: visit 8 or 8/12       Evaluating Physical Therapist: Damien Parcel, PT, DPT  PT diagnosis/Reason for Referral: R shoulder pain  Next Scheduled Physician Appointment: PRN  Allergies/Contraindications: hx BCA, aneurysm B frontal lobes, h/o blood clots, DMII, occasional falls    Subjective:  Pt reports her shoulder is a little sore.  Notes she did attend her retreat over weekend and did well.  States she is doing exercise at home as advised.  Does feel like shoulder is better at times but not at other times.  Notes the tape really does help.  Rates pain 6/10 today.      Objective:  Activity as noted below.       EXERCISE/ACTIVITY NAME REPETITIONS RESISTANCE COMPLETED THIS DOS   UBE   5 min   Y    Pulleys    30x   y   Postural education    5'   y   KT tape    2 I strips for correction of forward humeral head and protracted scapula  Y    Seated scap squeeze     10x   y    Pull down   Row      10    10   Yellow   yellow  Y   Y     Supine horizontal abd   Supine diagonals   10   10 each  Yellow   Yellow   Y   Y          TEOB  10x    n   Supine wand bench press   Supine wand flexion  10x   10x    Y   y   Supine hor abd/add   Yellow TB y   MFR UT, LS, rhomboids, R post GH 10' gentle Y    Manual: post and inf GH mobs gr 3 with towel under palm X 20 ea   y            DISCONTINUED ACTIVITIES           Assessment:  Pt tolerated treatment well.  She notes good relief with exercise and with use of tape.  Pt continues to have limited mobility and altered mechanics with all movement     Short-Term Goals 4 Weeks:   - Patient will be  independent with progressive HEP to maximize gains from PT.   - Patient will report less than max pain of 5/10 to aid with participation in physical therapy.   - Patient will exhibit proximal stability of the involved extremity to [facility] to permit normal posturing of the UE at rest.   - Patient will demonstrate improved R shoulder AROM to at least 115 degrees without pain to aid in functional reaching.     Long-Term Goals: 8 Weeks:   - Patient will improve functional ability with Patient Specific Functional Scale score of at least 6.   - Patient will demonstrate improved R shoulder AROM to pain free and equal to L UE to aid  in ability to perform ADLs independently.   - Patient will demonstrate improved R shoulder strength to at least 4/5 throughout to aid in return to previous level of function.   - Patient will demonstrate improved grip strength with R UE to at least 50 pounds of pressure to aid in completion of IADLs.   - Patient will report sleep not disrupted by shoulder pain to allow participation of functional ADLs during the day.       Plan:  Will continue and progress as tolerated.  Assess effects of t-band exercise     Total Session Time 35 and Timed code minutes 35  THERAPEUTIC EXERCISE 35 minutes      Tray Klayman, PTA  05/18/2024, 13:19

## 2024-05-20 ENCOUNTER — Ambulatory Visit
Admission: RE | Admit: 2024-05-20 | Discharge: 2024-05-20 | Disposition: A | Discharge status: Still In Hospital | Payer: MEDICAID | Source: Ambulatory Visit | Attending: Family Medicine

## 2024-05-20 ENCOUNTER — Other Ambulatory Visit: Payer: Self-pay

## 2024-05-20 NOTE — PT Treatment (Signed)
 [facility]  Outpatient Physical Therapy  [address]  [place], [address]  [phone]  (Fax) [phone]    Physical Therapy Treatment Note/Discharge Summary    Date: 05/20/2024  Patient's Name: Gail Morris  Date of Birth: 1965/12/07  Physical Therapy Visit    Visit #/POC: 7/16  Authorization: 20 cy then pa  POC Ends: 06/21/2024  Order Ends: open  Next Progress Note Due: visit 8 or 8/12       Evaluating Physical Therapist: Damien Parcel, PT, DPT  PT diagnosis/Reason for Referral: R shoulder pain  Next Scheduled Physician Appointment: PRN  Allergies/Contraindications: hx BCA, aneurysm B frontal lobes, h/o blood clots, DMII, occasional falls     Subjective:  Pt reports her R shoulder is 7/10 pain today. She does not feel like PT has helped much overall. She is unable to sleep on R, she cannot use her R arm to reach, push, pull, lift, carry.     Objective:  Activity as noted below.         Patient-Specific Functional Score:     Problem Score   1. Doing dishes 1   2. Laying on R side 1   3. Washing hair 1   TOTAL 1   Total score = sum of the activity scores/number of activities    Minimal detectable change (90% CI) for avg score = 2 points    Minimal detectable change (90% CI) for single activity score = 3 points         OBJECTIVE     Shoulder AROM/AAROM    right left   Flexion 122*/122* 115   Extension 30* 50   Abduction 75* 110   ER R ear C3   IR R hip L1      Cervical flexion 50 deg, ext 0 deg, RSB 25* LSB 35* RROT 45 LROT 40      ROM comment Pain with R shoulder flexion, ext, ER, IR     Pt is RHD     Strength (Manual Muscle Testing per [employer] Muscle Grading system) *tested in neutral       right left   Shoulder flexion 3 4   Shoulder abduction  3 4   Shoulder IR 3 4   Shoulder ER 3 4   Elbow flexion  3 4   Elbow extension  3 4   grip 32# 54#         EXERCISE/ACTIVITY NAME REPETITIONS RESISTANCE COMPLETED THIS DOS   UBE   5 min    Y    Pulleys    30x   y   Postural  education    5'   y   KT tape    2 I strips for correction of forward humeral head and protracted scapula  Y    Seated scap squeeze     10x   y    Pull down   Row      10    10   Yellow   yellow  Y   Y     Supine horizontal abd   Supine diagonals   10   10 each  Yellow   Yellow   n   n             TEOB  10x    y   Supine wand bench press   Supine wand flexion  10x   10x    Y   y  Supine hor abd/add   Yellow TB y   MFR UT, LS, rhomboids, R post GH 5' gentle Y    Manual: post and inf GH mobs gr 3 with towel under palm X 20 ea   y            DISCONTINUED ACTIVITIES            Assessment:  Pt has not made any objective gains, and she reports no subjective improvements over the past 4 weeks. She still has severe R shoulder pain, weakness, and restricted ROM which is interfering with her ability to use her dominant arm for ADLs such as dressing, washing, reaching, pushing, pulling, bathing, and sleeping. PT recs DC from PT and recommends MRI and orthopedic f/u.     Short-Term Goals 4 Weeks:   - Patient will be independent with progressive HEP to maximize gains from PT. (MET 05/20/2024)  - Patient will report less than max pain of 5/10 to aid with participation in physical therapy. (NOT MET 05/20/2024)  - Patient will exhibit proximal stability of the involved extremity to [facility] to permit normal posturing of the UE at rest. (NOT MET 05/20/2024)  - Patient will demonstrate improved R shoulder AROM to at least 115 degrees without pain to aid in functional reaching. (NOT MET 05/20/2024)    Long-Term Goals: 8 Weeks:   - Patient will improve functional ability with Patient Specific Functional Scale score of at least 6. (NOT MET 05/20/2024)  - Patient will demonstrate improved R shoulder AROM to pain free and equal to L UE to aid in ability to perform ADLs independently. (NOT MET 05/20/2024)  - Patient will demonstrate improved R shoulder strength to at least 4/5 throughout to aid in return to previous level of function. (NOT MET  05/20/2024)  - Patient will demonstrate improved grip strength with R UE to at least 50 pounds of pressure to aid in completion of IADLs. (NOT MET 05/20/2024)  - Patient will report sleep not disrupted by shoulder pain to allow participation of functional ADLs during the day. (NOT MET 05/20/2024)        Plan:  DC PT and recommends MRI and orthopedic f/u     Total Session Time 30 and Timed code minutes 30  THERAPEUTIC EXERCISE 22 minutes and JOINT MOBILIZATION/MFR 8 minutes      Damien Parcel, PT  05/20/2024, 13:40

## 2024-05-25 ENCOUNTER — Ambulatory Visit (HOSPITAL_COMMUNITY): Payer: Self-pay

## 2024-05-28 ENCOUNTER — Ambulatory Visit (HOSPITAL_COMMUNITY): Payer: Self-pay

## 2024-05-28 ENCOUNTER — Other Ambulatory Visit (HOSPITAL_COMMUNITY): Payer: Self-pay | Admitting: Family Medicine

## 2024-05-28 DIAGNOSIS — M25511 Pain in right shoulder: Secondary | ICD-10-CM

## 2024-06-01 ENCOUNTER — Ambulatory Visit (HOSPITAL_COMMUNITY): Payer: Self-pay

## 2024-06-03 ENCOUNTER — Ambulatory Visit (HOSPITAL_COMMUNITY): Payer: Self-pay

## 2024-06-12 ENCOUNTER — Encounter (HOSPITAL_BASED_OUTPATIENT_CLINIC_OR_DEPARTMENT_OTHER): Payer: Self-pay

## 2024-06-12 ENCOUNTER — Other Ambulatory Visit: Payer: Self-pay

## 2024-06-12 ENCOUNTER — Emergency Department
Admission: EM | Admit: 2024-06-12 | Discharge: 2024-06-12 | Disposition: A | Discharge status: Home / Self Care | Payer: MEDICAID | Source: Home / Self Care | Attending: Family | Admitting: Family

## 2024-06-12 DIAGNOSIS — J0110 Acute frontal sinusitis, unspecified: Secondary | ICD-10-CM | POA: Insufficient documentation

## 2024-06-12 DIAGNOSIS — Z1152 Encounter for screening for COVID-19: Secondary | ICD-10-CM

## 2024-06-12 DIAGNOSIS — J069 Acute upper respiratory infection, unspecified: Secondary | ICD-10-CM | POA: Insufficient documentation

## 2024-06-12 DIAGNOSIS — Z20822 Contact with and (suspected) exposure to covid-19: Secondary | ICD-10-CM

## 2024-06-12 LAB — COVID-19, FLU A/B, RSV RAPID BY PCR
INFLUENZA VIRUS TYPE A: NOT DETECTED
INFLUENZA VIRUS TYPE B: NOT DETECTED
RESPIRATORY SYNCTIAL VIRUS (RSV): NOT DETECTED
SARS-CoV-2: NOT DETECTED

## 2024-06-12 MED ORDER — AZITHROMYCIN 250 MG TABLET
ORAL_TABLET | ORAL | Status: AC
Start: 2024-06-12 — End: 2024-06-12
  Filled 2024-06-12: qty 2

## 2024-06-12 MED ORDER — AZITHROMYCIN 250 MG TABLET
500.0000 mg | ORAL_TABLET | ORAL | Status: AC
Start: 2024-06-12 — End: 2024-06-12
  Administered 2024-06-12: 500 mg via ORAL

## 2024-06-12 MED ORDER — AZITHROMYCIN 250 MG TABLET
ORAL_TABLET | ORAL | 0 refills | Status: AC
Start: 2024-06-12 — End: ?

## 2024-06-12 NOTE — ED Provider Notes (Signed)
 [facility] [place] - Emergency Department  ED Primary Note  History of Present Illness   Gail Morris is a 58 y.o. female who had concerns including Flu Like Symptoms. Pt states sinus congestion for a few days exp to sick contacts wants covid check gluc 160 pta  Review of Systems   Constitutional: No fever, chills or weakness   Skin: No rash or diaphoresis  HENT: No headaches, + congestion  Eyes: No vision changes or photophobia   Cardio: No chest pain, palpitations or leg swelling   Respiratory: + cough, no wheezing or SOB  GI:  No nausea, vomiting or stool changes  GU:  No dysuria, hematuria, or increased frequency  MSK: No muscle aches, joint or back pain  Neuro: No seizures, LOC, numbness, tingling, or focal weakness  Psychiatric: No depression, SI or substance abuse  All other systems reviewed and are negative.      Physical Exam   ED Triage Vitals [06/12/24 2106]   BP (Non-Invasive) (!) 151/87   Heart Rate 74   Respiratory Rate 18   Temperature 37.3 C (99.2 F)   SpO2 96 %   Weight 53.1 kg (117 lb)   Height 1.575 m (5' 2)     Constitutional:  58 y.o. female who appears in no distress. Normal color, no cyanosis.   HENT:   Head: Normocephalic and atraumatic.   Mouth/Throat: Oropharynx is red post nasal DC  and moist.   Eyes: EOMI, PERRL   Neck: Trachea midline. Neck supple.  Cardiovascular: RRR, No murmurs, rubs or gallops. Intact distal pulses.  Pulmonary/Chest: BS equal bilaterally. No respiratory distress. No wheezes, rales or chest tenderness.   Abdominal: Bowel sounds present and normal. Abdomen soft, no tenderness, no rebound and no guarding.  Back: No midline spinal tenderness, no paraspinal tenderness, no CVA tenderness.           Musculoskeletal: No edema, tenderness or deformity.  Skin: warm and dry. No rash, erythema, pallor or cyanosis  Psychiatric: normal mood and affect. Behavior is normal.   Neurological: Patient keenly alert and responsive, easily able to raise  eyebrows, facial muscles/expressions symmetric, speaking in fluent sentences, moving all extremities equally and fully, normal gait  Patient Data   Labs Ordered/Reviewed   COVID-19, FLU A/B, RSV RAPID BY PCR - Normal    Narrative:     Results are for the simultaneous qualitative identification of SARS-CoV-2 (formerly 2019-nCoV), Influenza A, Influenza B, and RSV RNA. These etiologic agents are generally detectable in nasopharyngeal and nasal swabs during the ACUTE PHASE of infection. Hence, this test is intended to be performed on respiratory specimens collected from individuals with signs and symptoms of upper respiratory tract infection who meet Centers for Disease Control and Prevention (CDC) clinical and/or epidemiological criteria for Coronavirus Disease 2019 (COVID-19) testing. CDC COVID-19 criteria for testing on human specimens is available at [facility] webpage information for Healthcare Professionals: Coronavirus Disease 2019 (COVID-19) ([employer]).     False-negative results may occur if the virus has genomic mutations, insertions, deletions, or rearrangements or if performed very early in the course of illness. Otherwise, negative results indicate virus specific RNA targets are not detected, however negative results do not preclude SARS-CoV-2 infection/COVID-19, Influenza, or Respiratory syncytial virus infection. Results should not be used as the sole basis for patient management decisions. Negative results must be combined with clinical observations, patient history, and epidemiological information. If upper respiratory tract infection is still suspected based on exposure history together with other  clinical findings, re-testing should be considered.    Test methodology:   Cepheid Xpert Xpress SARS-CoV-2/Flu/RSV Assay real-time polymerase chain reaction (RT-PCR) test on the GeneXpert Dx and Xpert Xpress systems.     No orders to display     Medical Decision  Making   Diff dx of uri flu covid swabs neg.   Medications Ordered/Administered in the ED   azithromycin  (ZITHROMAX ) tablet (has no administration in time range)     Clinical Impression   Acute non-recurrent frontal sinusitis (Primary)   Upper respiratory tract infection, unspecified type   Lab test negative for COVID-19 virus       Disposition: Discharged

## 2024-06-12 NOTE — ED Nurses Note (Signed)
 Patient discharged home with family.  AVS reviewed with patient/care giver.  A written copy of the AVS and discharge instructions was given to the patient/care giver. Scripts handed to patient/care giver. Questions sufficiently answered as needed.  Patient/care giver encouraged to follow up with PCP as indicated.  In the event of an emergency, patient/care giver instructed to call 911 or go to the nearest emergency room.

## 2024-06-26 ENCOUNTER — Encounter (HOSPITAL_BASED_OUTPATIENT_CLINIC_OR_DEPARTMENT_OTHER): Payer: Self-pay

## 2024-06-26 ENCOUNTER — Emergency Department (HOSPITAL_BASED_OUTPATIENT_CLINIC_OR_DEPARTMENT_OTHER): Payer: MEDICAID

## 2024-06-26 ENCOUNTER — Other Ambulatory Visit: Payer: Self-pay

## 2024-06-26 ENCOUNTER — Emergency Department
Admission: EM | Admit: 2024-06-26 | Discharge: 2024-06-27 | Disposition: A | Discharge status: Home / Self Care | Payer: MEDICAID | Source: Home / Self Care | Attending: Emergency Medicine | Admitting: Emergency Medicine

## 2024-06-26 DIAGNOSIS — F17210 Nicotine dependence, cigarettes, uncomplicated: Secondary | ICD-10-CM | POA: Insufficient documentation

## 2024-06-26 DIAGNOSIS — J440 Chronic obstructive pulmonary disease with acute lower respiratory infection: Secondary | ICD-10-CM

## 2024-06-26 DIAGNOSIS — J441 Chronic obstructive pulmonary disease with (acute) exacerbation: Secondary | ICD-10-CM | POA: Insufficient documentation

## 2024-06-26 DIAGNOSIS — R0602 Shortness of breath: Secondary | ICD-10-CM

## 2024-06-26 DIAGNOSIS — J209 Acute bronchitis, unspecified: Secondary | ICD-10-CM | POA: Insufficient documentation

## 2024-06-26 LAB — COVID-19, FLU A/B, RSV RAPID BY PCR
INFLUENZA VIRUS TYPE A: NOT DETECTED
INFLUENZA VIRUS TYPE B: NOT DETECTED
RESPIRATORY SYNCTIAL VIRUS (RSV): NOT DETECTED
SARS-CoV-2: NOT DETECTED

## 2024-06-26 MED ORDER — IPRATROPIUM 0.5 MG-ALBUTEROL 3 MG (2.5 MG BASE)/3 ML NEBULIZATION SOLN
3.0000 mL | INHALATION_SOLUTION | RESPIRATORY_TRACT | Status: AC
Start: 2024-06-26 — End: 2024-06-26
  Administered 2024-06-26: 3 mL via RESPIRATORY_TRACT

## 2024-06-26 MED ORDER — CEFTRIAXONE 1 GRAM SOLUTION FOR INJECTION
INTRAMUSCULAR | Status: AC
Start: 2024-06-26 — End: 2024-06-26
  Filled 2024-06-26: qty 10

## 2024-06-26 MED ORDER — DEXAMETHASONE SODIUM PHOSPHATE (PF) 10 MG/ML INJECTION SOLUTION
INTRAMUSCULAR | Status: AC
Start: 2024-06-26 — End: 2024-06-26
  Filled 2024-06-26: qty 1

## 2024-06-26 MED ORDER — DEXAMETHASONE SODIUM PHOSPHATE (PF) 10 MG/ML INJECTION SOLUTION
10.0000 mg | INTRAMUSCULAR | Status: AC
Start: 2024-06-26 — End: 2024-06-26
  Administered 2024-06-26: 10 mg via INTRAMUSCULAR

## 2024-06-26 MED ORDER — IPRATROPIUM 0.5 MG-ALBUTEROL 3 MG (2.5 MG BASE)/3 ML NEBULIZATION SOLN
INHALATION_SOLUTION | RESPIRATORY_TRACT | Status: AC
Start: 2024-06-26 — End: 2024-06-26
  Filled 2024-06-26: qty 3

## 2024-06-26 MED ORDER — LIDOCAINE HCL 10 MG/ML (1 %) INJECTION SOLUTION
1.0000 g | Freq: Once | INTRAMUSCULAR | Status: AC
Start: 2024-06-27 — End: 2024-06-27
  Administered 2024-06-27: 1 g via INTRAMUSCULAR

## 2024-06-26 NOTE — ED Provider Notes (Addendum)
 [facility] emergency department         HISTORY OF PRESENT ILLNESS     Date:  06/26/2024  Patient's Name:  Gail Morris  Date of Birth:  [birth year]    Patient is a 58 year old smoker presenting with cough congestion runny nose for the past week.  Patient recently started on Zithromax  for upper respiratory symptoms.  There has been no nausea or vomiting.  Patient denies fever or chills.        Review of Systems     Review of Systems   Constitutional: Negative.    HENT: Negative.     Eyes: Negative.    Respiratory:  Positive for cough and shortness of breath.    Gastrointestinal: Negative.    Musculoskeletal: Negative.    Psychiatric/Behavioral: Negative.     All other systems reviewed and are negative.      Previous History     Past Medical History:  Past Medical History:   Diagnosis Date    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    Anxiety     Breast cancer     Cancer (CMS HCC)     Depression     Diabetes mellitus, type 2     Esophageal reflux     H/O blood clots     H/O foot surgery     Hiccups     HTN (hypertension)     Migraine     Neuropathy (CMS HCC)     Primary generalized (osteo)arthritis        Past Surgical History:  Past Surgical History:   Procedure Laterality Date    Hand surgery      Hx back surgery      Hx breast reconstruction      Hx cesarean section      Hx cholecystectomy      Shoulder adhesion release Left     Simple mastectomy Bilateral        Social History:  Social History[1]  Social History     Substance and Sexual Activity   Drug Use Yes    Types: Marijuana    Comment: USED AROUND 3 DAYS AGO       Family History:  Family History   Problem Relation Age of Onset    Diabetes type II Mother     No Known Problems Father        Medication History:  Current Outpatient Medications   Medication Sig    ACCU-CHEK GUIDE ME GLUCOSE MTR Does not apply Misc Four times a day    ACCU-CHEK GUIDE TEST STRIPS Does not apply Strip Twice daily    ACCU-CHEK  SOFTCLIX LANCETS Misc Apply topically Twice daily    acetaminophen  (TYLENOL ) 500 mg Oral Tablet Take 2 Tablets (1,000 mg total) by mouth Every 6 hours as needed for Pain    albuterol  sulfate (PROVENTIL  OR VENTOLIN  OR PROAIR ) 90 mcg/actuation Inhalation oral inhaler Take 1-2 Puffs by inhalation Every 6 hours as needed for up to 30 days    albuterol  sulfate (PROVENTIL ) 2.5 mg /3 mL (0.083 %) Inhalation nebulizer solution Take 3 mL (2.5 mg total) by nebulization Every 4 hours as needed for Wheezing for up to 30 days    aspirin  (ECOTRIN) 81 mg Oral Tablet, Delayed Release (E.C.) Take 1 Tablet (81 mg total) by mouth Daily    azithromycin  (ZITHROMAX ) 250 mg Oral Tablet Take 500 mg (2 tab) on day  1; take 250 mg (1 tab) on days 2-5.    BD UF MINI PEN NEEDLE 31 gauge x 3/16 Needle SMARTSIG:SUB-Q    doxycycline  hyclate (VIBRAMYCIN ) 100 mg Oral Capsule Take 1 Capsule (100 mg total) by mouth Twice daily for 10 days    empagliflozin (JARDIANCE) 25 mg Oral Tablet Take 1 Tablet (25 mg total) by mouth Daily    ergocalciferol, vitamin D2, (DRISDOL) 1,250 mcg (50,000 unit) Oral Capsule Take 1 Capsule (50,000 Units total) by mouth Every 7 days    flash glucose scanning reader (FREESTYLE LIBRE 14 DAY READER) Does not apply Misc USE TO CHECK GLUCOSE AS DIRECTED 4 TIMES DAILY    gabapentin  (NEURONTIN ) 100 mg Oral Capsule 1 Capsule (100 mg total) Three times a day    gabapentin  (NEURONTIN ) 800 mg Oral Tablet Take 1 Tablet (800 mg total) by mouth Three times a day    Insulin  Syringe-Needle U-100 0.3 mL 29 gauge x 1/2 Syringe Daily    NURTEC ODT 75 mg Oral Tablet, Rapid Dissolve Place 1 Tablet (75 mg total) under the tongue Every 24 hours as needed    ondansetron  (ZOFRAN  ODT) 4 mg Oral Tablet, Rapid Dissolve Take 1 Tablet (4 mg total) by mouth Four times a day as needed    predniSONE  (DELTASONE ) 50 mg Oral Tablet Take 1 Tablet (50 mg total) by mouth Daily for 5 days    ramipriL (ALTACE) 10 mg Oral Capsule Take 1 Capsule (10 mg total) by  mouth Daily    simvastatin (ZOCOR) 10 mg Oral Tablet SMARTSIG:1 Tablet(s) By Mouth Every Evening (Patient not taking: Reported on 04/01/2024)    TOUJEO  MAX U-300 SOLOSTAR 300 unit/mL (3 mL) Subcutaneous Insulin  Pen Inject 25 Units under the skin Daily for 30 days       Allergies:  Allergies[2]    Physical Exam     Vitals:    BP (!) 137/114   Pulse 93   Temp 37.4 C (99.3 F)   Resp 19   Ht 1.575 m (5' 2)   Wt 51.7 kg (114 lb)   SpO2 97%   BMI 20.85 kg/m           Physical Exam  Vitals and nursing note reviewed. Exam conducted with a chaperone present.   Constitutional:       General: She is not in acute distress.     Appearance: Normal appearance. She is well-developed.   HENT:      Head: Normocephalic and atraumatic.      Right Ear: Tympanic membrane and ear canal normal.      Left Ear: Ear canal normal.      Mouth/Throat:      Mouth: Mucous membranes are moist.   Eyes:      Conjunctiva/sclera: Conjunctivae normal.      Pupils: Pupils are equal, round, and reactive to light.   Cardiovascular:      Rate and Rhythm: Regular rhythm. Tachycardia present.      Pulses: Normal pulses.      Heart sounds: No murmur heard.  Pulmonary:      Effort: Respiratory distress present.      Breath sounds: Wheezing and rhonchi present.   Abdominal:      General: Bowel sounds are normal.      Palpations: Abdomen is soft.      Tenderness: There is no abdominal tenderness.   Musculoskeletal:         General: No swelling. Normal range of motion.  Cervical back: Normal range of motion and neck supple.   Skin:     General: Skin is warm and dry.      Capillary Refill: Capillary refill takes less than 2 seconds.   Neurological:      General: No focal deficit present.      Mental Status: She is alert and oriented to person, place, and time.   Psychiatric:         Mood and Affect: Mood normal.         Diagnostic Studies/Treatment     Medications:  Medications Ordered/Administered in the ED   cefTRIAXone  (ROCEPHIN ) 1 g in lidocaine   2.86 mL (tot vol) IM injection (1 g IntraMUSCULAR Incomplete 06/26/24 2358)   ipratropium-albuterol  0.5 mg-3 mg(2.5 mg base)/3 mL Solution for Nebulization (3 mL Nebulization Given 06/26/24 2327)   dexAMETHasone  (PF) 10 mg/mL injection (10 mg IntraMUSCULAR Given 06/26/24 2353)       New Prescriptions    ALBUTEROL  SULFATE (PROVENTIL  OR VENTOLIN  OR PROAIR ) 90 MCG/ACTUATION INHALATION ORAL INHALER    Take 1-2 Puffs by inhalation Every 6 hours as needed for up to 30 days    ALBUTEROL  SULFATE (PROVENTIL ) 2.5 MG /3 ML (0.083 %) INHALATION NEBULIZER SOLUTION    Take 3 mL (2.5 mg total) by nebulization Every 4 hours as needed for Wheezing for up to 30 days    DOXYCYCLINE  HYCLATE (VIBRAMYCIN ) 100 MG ORAL CAPSULE    Take 1 Capsule (100 mg total) by mouth Twice daily for 10 days    PREDNISONE  (DELTASONE ) 50 MG ORAL TABLET    Take 1 Tablet (50 mg total) by mouth Daily for 5 days       Labs:    Results for orders placed or performed during the hospital encounter of 06/26/24 (from the past 12 hours)   COVID-19, FLU A/B, RSV RAPID BY PCR   Result Value Ref Range    SARS-CoV-2 Not Detected Not Detected    INFLUENZA VIRUS TYPE A Not Detected Not Detected    INFLUENZA VIRUS TYPE B Not Detected Not Detected    RESPIRATORY SYNCTIAL VIRUS (RSV) Not Detected Not Detected        Radiology:  XR CHEST AP    XR CHEST AP   Final Result   No acute disease             Radiologist location ID: [id number]             ECG:  NONE            Differential diagnosis  Chronic Obstructive Pulmonary Disease exacerbation, pneumonia, COVID-19 infection    Course/Disposition/Plan     Course:     ED Course as of 06/27/24 0003   Sat Jun 26, 2024   2358 Acute bronchitis chest x-ray negative patient discharged on oral antibiotics to continue steroids and nebulizer   Will have chest x-ray done rule out pneumonia he we will be tested for COVID flu and RSV    Disposition:    Discharged    Condition at Disposition:   Stable    Follow up:   Zulema Pointer, DO  [address]  [place] [address]  [phone]    Schedule an appointment as soon as possible for a visit in 3 days  If symptoms worsen      Clinical Impression:     Clinical Impression   Chronic obstructive pulmonary disease with (acute) exacerbation (CMS HCC) (Primary)   Acute bronchitis, unspecified organism  Jesslyn Ponto, MD        [1]   Social History  Tobacco Use    Smoking status: Every Day     Current packs/day: 1.00     Types: Cigarettes    Smokeless tobacco: Never   Vaping Use    Vaping status: Never Used   Substance Use Topics    Alcohol use: Never    Drug use: Yes     Types: Marijuana     Comment: USED AROUND 3 DAYS AGO   [2]   Allergies  Allergen Reactions    Dulaglutide  Other Adverse Reaction (Add comment) and Diarrhea     Loss of appetite    Metformin  Other Adverse Reaction (Add comment) and Diarrhea     Dehydrates me    Tramadol  Other Adverse Reaction (Add comment)     Tachycardia    Other Reaction(s): Angioedema, Not available, tachycardia

## 2024-06-27 MED ORDER — DOXYCYCLINE HYCLATE 100 MG CAPSULE
100.0000 mg | ORAL_CAPSULE | Freq: Two times a day (BID) | ORAL | 0 refills | Status: AC
Start: 2024-06-27 — End: 2024-07-07

## 2024-06-27 MED ORDER — ALBUTEROL SULFATE HFA 90 MCG/ACTUATION AEROSOL INHALER
1.0000 | INHALATION_SPRAY | Freq: Four times a day (QID) | RESPIRATORY_TRACT | 0 refills | Status: AC | PRN
Start: 2024-06-27 — End: 2024-07-27

## 2024-06-27 MED ORDER — ALBUTEROL SULFATE 2.5 MG/3 ML (0.083 %) SOLUTION FOR NEBULIZATION
2.5000 mg | INHALATION_SOLUTION | RESPIRATORY_TRACT | 0 refills | Status: AC | PRN
Start: 2024-06-27 — End: 2024-07-27

## 2024-06-27 MED ORDER — PREDNISONE 50 MG TABLET
50.0000 mg | ORAL_TABLET | Freq: Every day | ORAL | 0 refills | Status: AC
Start: 2024-06-27 — End: 2024-07-02

## 2024-06-27 NOTE — ED Nurses Note (Signed)
 Patient discharged home with family.  AVS reviewed with patient/care giver.  A written copy of the AVS and discharge instructions was given to the patient/care giver. Scripts handed to patient/care giver. Questions sufficiently answered as needed.  Patient/care giver encouraged to follow up with PCP as indicated.  In the event of an emergency, patient/care giver instructed to call 911 or go to the nearest emergency room.

## 2024-06-27 NOTE — ED Nurses Note (Signed)
 Patient is complaining of generalized flu like symptoms. Patient states that she is congested, has a runny nose, cough, and bilateral ear pain. Patient also states that she feels short of breath. Patient states that her symptoms have been going on for about a week.

## 2024-06-28 NOTE — ED Notes (Signed)
 [facility] - Emergency Department  Peer Recovery Coach Assessment    Initial Evaluation  Referred by:: Nurse  Location of Evaluation: Emergency Department  How many times in the last 12 months have you been to the ED?: 6 or more  Have you ever served or are you currently serving in the Armed Forces?: No             Substance Use History  Patient current substance use status: Patient states she smokes marijuana for pain management and anxiety    Prior treatment history?: No    Currently enrolled in substance use program?: No    Within the last 30 days, what substances has the patient used?: Marijuana  Patient's age at first substance use?: 25 or older  Drug route of administration: Smoked    Has the patient ever had sustained abstinence?: No              Family, Social, Home & Safety History  Marital Status: Single    Number of children: 2       Need to improve relationships with family?: No    Social network: Immediate family, Substance using peers, Non-substance using peers/friends/other    Current living situation: Independent  Any help needed with the following?: None  Contact phone number for the patient: [phone]       Has the patient had any legal issues within the past 30 days?: None         Employment  Current employment status: Disabled    Diplomatic Services operational officer?: No  Needs assistance with job search?: No    Engagement  Readiness ruler: 1  Summary of assessment priority areas: Other    Brief Intervention  Discussed plan to reduce/quit substance use?: Yes  Discussed willingness to enter treatment?: Yes  Indicated patient's stage of change:: 1 - Precontemplation    Patient seen by Peer Recovery Coach and is a candidate for buprenorphine  administration in the ED. Patient needs assessment for bup treatment.: No (non opiod user)    Plan  Was the patient referred to treatment?: No    Was patient referred to physician for Buprenorphine  Assessment in the ED?: No    Did patient  receive Narcan in the ED?: No    Plan: Additional Comments: Long term use and affects on the body was discussed.    Follow-up  Patient admitted for treatment?: No        Need for additional follow-up?: No       Elenor Domino, Peer Recovery Coach 06/28/2024 18:47

## 2024-07-05 ENCOUNTER — Ambulatory Visit: Payer: MEDICAID

## 2024-07-05 ENCOUNTER — Ambulatory Visit (HOSPITAL_COMMUNITY): Payer: Self-pay

## 2024-07-09 ENCOUNTER — Emergency Department
Admission: EM | Admit: 2024-07-09 | Discharge: 2024-07-09 | Disposition: A | Discharge status: Home / Self Care | Payer: MEDICAID | Attending: Emergency Medicine | Admitting: Emergency Medicine

## 2024-07-09 ENCOUNTER — Emergency Department (HOSPITAL_COMMUNITY): Payer: MEDICAID

## 2024-07-09 ENCOUNTER — Encounter (HOSPITAL_COMMUNITY): Payer: Self-pay

## 2024-07-09 ENCOUNTER — Other Ambulatory Visit: Payer: Self-pay

## 2024-07-09 DIAGNOSIS — F17210 Nicotine dependence, cigarettes, uncomplicated: Secondary | ICD-10-CM | POA: Insufficient documentation

## 2024-07-09 DIAGNOSIS — J40 Bronchitis, not specified as acute or chronic: Secondary | ICD-10-CM

## 2024-07-09 DIAGNOSIS — Z720 Tobacco use: Secondary | ICD-10-CM

## 2024-07-09 DIAGNOSIS — J020 Streptococcal pharyngitis: Secondary | ICD-10-CM | POA: Insufficient documentation

## 2024-07-09 DIAGNOSIS — J439 Emphysema, unspecified: Secondary | ICD-10-CM | POA: Insufficient documentation

## 2024-07-09 DIAGNOSIS — J8410 Pulmonary fibrosis, unspecified: Secondary | ICD-10-CM

## 2024-07-09 DIAGNOSIS — R9431 Abnormal electrocardiogram [ECG] [EKG]: Secondary | ICD-10-CM | POA: Insufficient documentation

## 2024-07-09 DIAGNOSIS — I2510 Atherosclerotic heart disease of native coronary artery without angina pectoris: Secondary | ICD-10-CM

## 2024-07-09 DIAGNOSIS — Z86718 Personal history of other venous thrombosis and embolism: Secondary | ICD-10-CM | POA: Insufficient documentation

## 2024-07-09 DIAGNOSIS — E1165 Type 2 diabetes mellitus with hyperglycemia: Secondary | ICD-10-CM | POA: Insufficient documentation

## 2024-07-09 LAB — MANUAL DIFFERENTIAL
BASOPHIL %: 3 % (ref 0–3)
BASOPHIL ABSOLUTE: 0.32 x10ˆ3/uL — ABNORMAL HIGH (ref 0.00–0.30)
BASOPHILS MANUAL: 3
EOSINOPHIL %: 2 % (ref 0–7)
EOSINOPHIL ABSOLUTE: 0.22 x10ˆ3/uL (ref 0.00–0.80)
EOSINOPHILS MANUAL: 2
LYMPHOCYTE %: 35 % (ref 25–45)
LYMPHOCYTE ABSOLUTE: 3.78 x10ˆ3/uL (ref 1.10–5.00)
LYMPHOCYTES MANUAL: 35
MONOCYTE %: 1 % (ref 0–12)
MONOCYTE ABSOLUTE: 0.11 x10ˆ3/uL (ref 0.00–0.30)
MONOCYTES MANUAL: 1
NEUTROPHIL %: 59 % (ref 40–76)
NEUTROPHIL ABSOLUTE: 6.37 x10ˆ3/uL (ref 1.80–8.40)
NEUTROPHILS MANUAL: 59
PLATELET MORPHOLOGY COMMENT: NORMAL
RBC MORPHOLOGY COMMENT: NORMAL
SCHISTOCYTES: ABSENT
TOTAL CELLS COUNTED [#] IN BLOOD: 100
WBC: 10.8 x10ˆ3/uL

## 2024-07-09 LAB — CBC WITH DIFF
HCT: 41.4 % (ref 31.2–41.9)
HGB: 15.4 g/dL — ABNORMAL HIGH (ref 10.9–14.3)
MCH: 33.2 pg — ABNORMAL HIGH (ref 24.7–32.8)
MCHC: 37.2 g/dL — ABNORMAL HIGH (ref 32.3–35.6)
MCV: 89.2 fL (ref 75.5–95.3)
MPV: 7.8 fL — ABNORMAL LOW (ref 7.9–10.8)
PLATELETS: 319 x10ˆ3/uL (ref 140–440)
RBC: 4.65 x10ˆ6/uL (ref 3.63–4.92)
RDW: 14 % (ref 12.3–17.7)
WBC: 10.8 x10ˆ3/uL (ref 3.8–11.8)

## 2024-07-09 LAB — COMPREHENSIVE METABOLIC PANEL, NON-FASTING
ALBUMIN/GLOBULIN RATIO: 1.1 (ref 0.8–1.4)
ALBUMIN: 4 g/dL (ref 3.5–5.7)
ALKALINE PHOSPHATASE: 136 U/L — ABNORMAL HIGH (ref 34–104)
ALT (SGPT): 11 U/L (ref 7–52)
ANION GAP: 14 mmol/L — ABNORMAL HIGH (ref 4–13)
AST (SGOT): 16 U/L (ref 13–39)
BILIRUBIN TOTAL: 0.3 mg/dL (ref 0.3–1.0)
BUN/CREA RATIO: 14 (ref 6–22)
BUN: 8 mg/dL (ref 7–25)
CALCIUM, CORRECTED: 9.6 mg/dL (ref 8.9–10.8)
CALCIUM: 9.6 mg/dL (ref 8.6–10.3)
CHLORIDE: 101 mmol/L (ref 98–107)
CO2 TOTAL: 17 mmol/L — ABNORMAL LOW (ref 21–31)
CREATININE: 0.58 mg/dL — ABNORMAL LOW (ref 0.60–1.30)
ESTIMATED GFR: 106 mL/min/1.73mˆ2 (ref >59)
GLOBULIN: 3.6 — ABNORMAL HIGH (ref 2.0–3.5)
GLUCOSE: 389 mg/dL — ABNORMAL HIGH (ref 74–109)
OSMOLALITY, CALCULATED: 279 mosm/kg (ref 270–290)
POTASSIUM: 3.8 mmol/L (ref 3.5–5.1)
PROTEIN TOTAL: 7.6 g/dL (ref 6.4–8.9)
SODIUM: 132 mmol/L — ABNORMAL LOW (ref 136–145)

## 2024-07-09 LAB — URINALYSIS, MICROSCOPIC
NON-SQUAMOUS EPITHELIAL CELLS URINE: <1 /HPF (ref <=1)
SQUAMOUS EPITHELIAL: 10 /HPF (ref <28)
WBCS: <1 /HPF (ref <6)

## 2024-07-09 LAB — ECG 12 LEAD
Atrial Rate: 76 {beats}/min
Calculated P Axis: 62 degrees
Calculated R Axis: 69 degrees
Calculated T Axis: 29 degrees
PR Interval: 150 ms
QRS Duration: 104 ms
QT Interval: 418 ms
QTC Calculation: 470 ms
Ventricular rate: 76 {beats}/min

## 2024-07-09 LAB — BLOOD GAS W/ LACTATE REFLEX
%FIO2 (ARTERIAL): 21 %
BASE EXCESS (ARTERIAL): 0.6 mmol/L (ref 0.0–3.0)
BICARBONATE (ARTERIAL): 25.4 mmol/L (ref 21.0–28.0)
LACTATE: 1.8 mmol/L (ref <=1.9)
O2 SATURATION (ARTERIAL): 98.2 % (ref 94.0–98.0)
PAO2/FIO2 RATIO: 433
PCO2 (ARTERIAL): 22 mmHg — ABNORMAL LOW (ref 35–45)
PH (ARTERIAL): 7.58 — ABNORMAL HIGH (ref 7.35–7.45)
PO2 (ARTERIAL): 91 mmHg (ref 83–108)

## 2024-07-09 LAB — URINALYSIS, MACROSCOPIC
BILIRUBIN: NEGATIVE mg/dL
BLOOD: NEGATIVE mg/dL
GLUCOSE: >1000 mg/dL — AB
KETONES: 10 mg/dL — AB
LEUKOCYTES: NEGATIVE WBCs/uL
NITRITE: NEGATIVE
PH: 6.5 (ref 5.0–9.0)
PROTEIN: NEGATIVE mg/dL
SPECIFIC GRAVITY: 1.041 — ABNORMAL HIGH (ref 1.002–1.030)
UROBILINOGEN: NORMAL mg/dL

## 2024-07-09 LAB — POC BLOOD GLUCOSE (RESULTS): GLUCOSE, POC: 247 mg/dL — ABNORMAL HIGH (ref 70–100)

## 2024-07-09 LAB — DRUG SCREEN, NO CONFIRMATION, URINE
AMPHETAMINES URINE: NEGATIVE
BARBITURATES URINE: NEGATIVE
BENZODIAZEPINES URINE: NEGATIVE
BUPRENORPHINE URINE: NEGATIVE
CANNABINOIDS URINE: POSITIVE — AB
COCAINE METABOLITES URINE: NEGATIVE
FENTANYL, URINE: NEGATIVE
METHADONE URINE: NEGATIVE
OPIATES URINE: NEGATIVE
OXYCODONE URINE: NEGATIVE
PCP URINE: NEGATIVE

## 2024-07-09 LAB — D-DIMER: D-DIMER: 922 ng{FEU}/mL (ref 215–500)

## 2024-07-09 LAB — COVID-19, FLU A/B, RSV RAPID BY PCR
INFLUENZA VIRUS TYPE A: NOT DETECTED
INFLUENZA VIRUS TYPE B: NOT DETECTED
RESPIRATORY SYNCTIAL VIRUS (RSV): NOT DETECTED
SARS-CoV-2: NOT DETECTED

## 2024-07-09 LAB — PTT (PARTIAL THROMBOPLASTIN TIME): APTT: 27.8 s (ref 25.0–38.0)

## 2024-07-09 LAB — RAPID THROAT SCREEN, STREPTOCOCCUS, WITH REFLEX: THROAT RAPID SCREEN, STREPTOCOCCUS: POSITIVE — AB

## 2024-07-09 LAB — PT/INR
INR: 0.97 (ref 0.84–1.10)
PROTHROMBIN TIME: 11 s (ref 9.8–12.7)

## 2024-07-09 LAB — B-TYPE NATRIURETIC PEPTIDE (BNP),PLASMA: BNP: 14 pg/mL (ref 1–100)

## 2024-07-09 LAB — TROPONIN-I: TROPONIN I: 4 ng/L (ref <15)

## 2024-07-09 LAB — LACTIC ACID LEVEL W/ REFLEX FOR LEVEL >2.0: LACTIC ACID: 1.4 mmol/L (ref 0.5–2.2)

## 2024-07-09 LAB — BETA-HYDROXYBUTYRATE: BETA-HYDROXYBUTYRATE: 1.06 mmol/L — ABNORMAL HIGH (ref 0.02–0.27)

## 2024-07-09 LAB — MAGNESIUM: MAGNESIUM: 2 mg/dL (ref 1.9–2.7)

## 2024-07-09 MED ORDER — IOPAMIDOL 370 MG IODINE/ML (76 %) INTRAVENOUS SOLUTION
75.0000 mL | INTRAVENOUS | Status: AC
Start: 2024-07-09 — End: 2024-07-09
  Administered 2024-07-09: 75 mL via INTRAVENOUS

## 2024-07-09 MED ORDER — AMOXICILLIN 875 MG-POTASSIUM CLAVULANATE 125 MG TABLET
1.0000 | ORAL_TABLET | ORAL | Status: AC
Start: 2024-07-09 — End: 2024-07-09
  Administered 2024-07-09: 1 via ORAL

## 2024-07-09 MED ORDER — AMOXICILLIN 875 MG-POTASSIUM CLAVULANATE 125 MG TABLET
ORAL_TABLET | ORAL | Status: AC
Start: 2024-07-09 — End: 2024-07-09
  Filled 2024-07-09: qty 1

## 2024-07-09 MED ORDER — INSULIN REGULAR HUMAN 100 UNIT/ML INJECTION - CHARGE BY DOSE
8.0000 [IU] | INTRAMUSCULAR | Status: AC
Start: 2024-07-09 — End: 2024-07-09
  Administered 2024-07-09: 8 [IU] via SUBCUTANEOUS

## 2024-07-09 MED ORDER — AMOXICILLIN 875 MG-POTASSIUM CLAVULANATE 125 MG TABLET
1.0000 | ORAL_TABLET | Freq: Two times a day (BID) | ORAL | 0 refills | Status: AC
Start: 2024-07-09 — End: 2024-07-19

## 2024-07-09 MED ORDER — INSULIN REGULAR HUMAN 100 UNIT/ML INJ FOR MIXTURES -RX ADMIN UNIT ROUNDS TO NEAREST 0.01 ML
INTRAMUSCULAR | Status: AC
Start: 2024-07-09 — End: 2024-07-09
  Filled 2024-07-09: qty 8

## 2024-07-09 NOTE — ED Nurses Note (Signed)

## 2024-07-09 NOTE — Discharge Instructions (Addendum)
 VISIT SUMMARY:  You came in today with shortness of breath and a cough that has lasted for four weeks. We discussed your respiratory health, a recent strep throat infection, your diabetes management, and the importance of quitting smoking.    YOUR PLAN:  ACUTE BRONCHITIS WITH CHRONIC OBSTRUCTIVE PULMONARY DISEASE (COPD): You have shortness of breath and a cough, which are symptoms of bronchitis and COPD. Your oxygen levels are good, and there is no sign of pneumonia.  -We will order a CTA of your chest to rule out a pulmonary embolism.  -It is very important that you stop smoking to improve your respiratory health.    ACUTE STREPTOCOCCAL PHARYNGITIS: You have a strep throat infection.  -Take Augmentin 875 mg twice a day for 10 days.  -You received the first dose of Augmentin in the emergency department.    TYPE 2 DIABETES MELLITUS, UNCONTROLLED: Your blood sugar level is very high, indicating that your diabetes is not well controlled.  -We gave you 8 units of insulin  today.  -Monitor your blood sugar levels closely.  -Continue taking your regular diabetes medications as prescribed.    TOBACCO USE DISORDER: You smoke up to one pack per day, which worsens your respiratory symptoms and COPD.  -It is very important that you stop smoking.  -Quitting smoking will greatly improve your health.       Contains text generated by [employer]bridge

## 2024-07-09 NOTE — ED Triage Notes (Signed)
 To ED via PRS. Called for flu like symptoms x 1 month. Increasing SOB, productive cough and fever. HA and neck pain.    18G LFA

## 2024-07-09 NOTE — ED Nurses Note (Signed)
 Patient to ED 12 via ambulation. Patient reports a cough x 4 weeks that worse over the last couple days. Patient reports congestion and headache that started today but become unbearable this evening. Patient states she has body aches that she states worsen with cough. Patient reports being on 3 different abx for this cough. Patient denies N/V/D, chest pain, fever, chills, and eye drainage. Patient reports head pain a 8/10.

## 2024-07-09 NOTE — ED Provider Notes (Addendum)
 [facility]  ED Primary Provider Note        Arrival: The patient arrived by Ambulance     History of Present Illness   chief complaint  Gail Morris is a 58 y.o. female who had concerns including Fever and Shortness of Breath.  Blood pressure 129/82 with a heart rate of 84, respiratory rate of 21, oxygen saturation 99% on room air.  Afebrile at 98.5    History of Present Illness  Gail Morris is a 58 year old female who presents with shortness of breath for four weeks.    Dyspnea and cough  - Shortness of breath for four weeks  - Nonproductive cough for four weeks  - No wheezing  - Oxygen saturation 98% on room air    Respiratory treatments and medications  - Uses albuterol  inhaler  - Has not used nebulizer  - Prescribed three antibiotics in the past month    Tobacco use  - Smokes less than a pack per day  - patient has decreased smoking to significant less than 1 pack when feeling poorly.    History of venous thromboembolism  - History of deep vein thrombosis with previous clots in the neck  - Previously treated with Lovenox   - Not currently on anticoagulation    Review of Systems     No other overt Review of Systems are noted to be positive except noted in the HPI.      Historical Data   History Reviewed This Encounter: Medical History  Surgical History  Family History  Social History      Physical Exam   ED Triage Vitals   BP (Non-Invasive) 07/09/24 0211 129/82   Heart Rate 07/09/24 0211 84   Respiratory Rate 07/09/24 0211 (!) 21   Temperature 07/09/24 0211 36.9 C (98.5 F)   SpO2 07/09/24 0211 99 %   Weight 07/09/24 0304 51.7 kg (114 lb)   Height 07/09/24 0304 1.575 m (5' 2)         Exam:   Constitutional:  Patient alert orient x3 in no apparent distress.  Strong odor of cigarette smoke.    Head: Atraumatic normocephalic  Eyes :  Pupils are equal round reactive to light and accommodation extraocular muscles are intact.  Sclera and conjunctiva are unremarkable  Ears:   Tympanic membranes are pearly gray bilaterally; external auditory canals are unremarkable; external ears without any lesions  Nose:  Nares are patent turbinates are pink and moist  Mouth:  Mucosa is pink and moist without lesions.  Posterior pharynx is pink and moist without hypertrophy/exudate.  Neck:  Soft and supple without palpable lymphadenopathy.  Heart:  Regular rate and rhythm 1/6 holosystolic ejection murmur   Lungs:  Clear to auscultation bilaterally without any wheezing/rales/rhonchi  Abdomen:  Soft nontender without any rebound or guarding; positive bowel sounds throughout  Genitalia:  Deferred  Skin:  Warm and dry without lesions.  Normal skin turgor.  Brisk capillary refill distally  Extremities:  Good strength bilaterally with full range of motion of upper and lower extremities.  No leg or calf tenderness, no edema  Neuro:  Alert oriented x3.  Cranial nerves II-XII grossly intact as tested.  Excellent sensation distally over all dermatomes.  Psychiatric:  Patient cooperative, affect appropriate, insight and judgment good          Procedures           Patient Data     Labs Ordered/Reviewed  RAPID THROAT SCREEN, STREPTOCOCCUS, WITH REFLEX - Abnormal; Notable for the following components:       Result Value    THROAT RAPID SCREEN, STREPTOCOCCUS Positive (*)     All other components within normal limits    Narrative:     Walk-Away Mode   COMPREHENSIVE METABOLIC PANEL, NON-FASTING - Abnormal; Notable for the following components:    SODIUM 132 (*)     CO2 TOTAL 17 (*)     ANION GAP 14 (*)     CREATININE 0.58 (*)     GLUCOSE 389 (*)     ALKALINE PHOSPHATASE 136 (*)     GLOBULIN 3.6 (*)     All other components within normal limits    Narrative:     Estimated Glomerular Filtration Rate (eGFR) is calculated using the CKD-EPI (2021) equation, intended for patients 40 years of age and older. If gender is not documented or unknown, there will be no eGFR calculation.     D-DIMER - Abnormal; Notable for the  following components:    D-DIMER 922 (*)     All other components within normal limits    Narrative:     D-Dimers are reported in FEU per ng/mL.    IF PATIENT IS EXHIBITING SYMPTOMS DVT/PE, THIS D-DIMER RESULT MAY INDICATE A NEED FOR FURTHER TESTING FOR THESE CONDITIONS. IF PATIENT IS SUSPECTED OF DIC AND SYMPTOMS WORSEN OR PERSIST, A REPEAT DIC WORKUP SHOULD BE CONSIDERED.    NOTE: ALTHOUGH THE NORMAL RANGE FOR THIS TEST IS 215-500 ng/mL FEU, LITERATURE RECOMMENDS FURTHER TESTING FOR ANY RESULT >500ng/mL FEU.    A cut off value of 500ng/mL FEU or below can be used as an aid in the diagnosis of Thromboembolism when used in conjunction with the patient's medical history, clinical presentation and other findings. Results of 500ng/mL FEU or below have a negative predictive value of 100%.     DRUG SCREEN, NO CONFIRMATION, URINE - Abnormal; Notable for the following components:    CANNABINOIDS URINE Positive (*)     All other components within normal limits    Narrative:     Any results reported as positive on this urine drug screen are unconfirmed screening results and should be used for medical(i.e.,treatment)purposes only. Unconfirmed screening results must not be used for non-medical purposes (e.g. employment or legal testing). Upon request, all results reported as positive can be sent to a reference laboratory for confirmation by GCMS.     Reporting Limits (cut-off concentrations)     Cocaine 300 ng/mL  Opiates 300 ng/mL  THC 50 ng/mL  Amphetamine 1000 ng/mL  Phencyclidine 25 ng/mL  Benzodiazepine 300 ng/mL  Barbiturates 300 ng/mL  Methadone 300 ng/mL  Oxycodone  100 ng/mL  Buprenorphine  5 ng/mL  Fentanyl  5 ng/mL     URINALYSIS, MACROSCOPIC - Abnormal; Notable for the following components:    SPECIFIC GRAVITY 1.041 (*)     GLUCOSE >1000 (*)     KETONES 10 (*)     All other components within normal limits   CBC WITH DIFF - Abnormal; Notable for the following components:    HGB 15.4 (*)     MCH 33.2 (*)     MCHC  37.2 (*)     MPV 7.8 (*)     All other components within normal limits   BLOOD GAS W/ LACTATE REFLEX - Abnormal; Notable for the following components:    PH (ARTERIAL) 7.58 (*)     PCO2 (ARTERIAL) 22 (*)  All other components within normal limits    Narrative:     A reference range for Oxygen Saturation is provided but abnormal results will not flag in Epic.   MANUAL DIFFERENTIAL - Abnormal; Notable for the following components:    BASOPHIL ABSOLUTE 0.32 (*)     All other components within normal limits   BETA-HYDROXYBUTYRATE - Abnormal; Notable for the following components:    BETA-HYDROXYBUTYRATE 1.06 (*)     All other components within normal limits   POC BLOOD GLUCOSE (RESULTS) - Abnormal; Notable for the following components:    GLUCOSE, POC 247 (*)     All other components within normal limits   COVID-19, FLU A/B, RSV RAPID BY PCR - Normal    Narrative:     Results are for the simultaneous qualitative identification of SARS-CoV-2 (formerly 2019-nCoV), Influenza A, Influenza B, and RSV RNA. These etiologic agents are generally detectable in nasopharyngeal and nasal swabs during the ACUTE PHASE of infection. Hence, this test is intended to be performed on respiratory specimens collected from individuals with signs and symptoms of upper respiratory tract infection who meet Centers for Disease Control and Prevention (CDC) clinical and/or epidemiological criteria for Coronavirus Disease 2019 (COVID-19) testing. CDC COVID-19 criteria for testing on human specimens is available at [facility] webpage information for Healthcare Professionals: Coronavirus Disease 2019 (COVID-19) ([email or web address]).     False-negative results may occur if the virus has genomic mutations, insertions, deletions, or rearrangements or if performed very early in the course of illness. Otherwise, negative results indicate virus specific RNA targets are not detected, however negative results do not preclude  SARS-CoV-2 infection/COVID-19, Influenza, or Respiratory syncytial virus infection. Results should not be used as the sole basis for patient management decisions. Negative results must be combined with clinical observations, patient history, and epidemiological information. If upper respiratory tract infection is still suspected based on exposure history together with other clinical findings, re-testing should be considered.    Test methodology:   Cepheid Xpert Xpress SARS-CoV-2/Flu/RSV Assay real-time polymerase chain reaction (RT-PCR) test on the GeneXpert Dx and Xpert Xpress systems.   B-TYPE NATRIURETIC PEPTIDE (BNP),PLASMA - Normal    Narrative:                                 Class 1: 101-250 pg/mL                              Class 2: 251-550 pg/mL                              Class 3: 551-900 pg/mL                              Class 4: >901 pg/mL     The [place]  [employer] has developed a four-stage functional classification system for CHF that is based on a subjective interpretation of the severity of a patient's clinical signs and symptoms.    Class 1 - Patients have no limitations on physical activity and have no symptoms with ordinary physical activity.    Class 2 - Patients have a slight limitation of physical activity and have symptoms with ordinary physical activity.    Class 3 - Patients have a marked limitation of physical activity and have symptoms  with less than ordinary physical activity, but not at rest.    Class 4 - Patients are unable to perform any physical activity without discomfort.   LACTIC ACID LEVEL W/ REFLEX FOR LEVEL >2.0 - Normal   MAGNESIUM  - Normal   PT/INR - Normal    Narrative:     In the setting of warfarin therapy, a moderate-intensity INR goal range is 2.0 to 3.0 and a high-intensity INR goal range is 2.5 to 3.5.    INR is ONLY validated to determine the level of anticoagulation with vitamin K antagonists (warfarin). Other factors may elevate the INR including but not  limited to direct oral anticoagulants (DOACs), liver dysfunction, vitamin K deficiency, DIC, factor deficiencies, and factor inhibitors.   PTT (PARTIAL THROMBOPLASTIN TIME) - Normal   TROPONIN-I - Normal   ADULT ROUTINE BLOOD CULTURE, SET OF 2 BOTTLES (BACTERIA AND YEAST)   ADULT ROUTINE BLOOD CULTURE, SET OF 2 BOTTLES (BACTERIA AND YEAST)   URINALYSIS, MACROSCOPIC AND MICROSCOPIC W/CULTURE REFLEX    Narrative:     The following orders were created for panel order URINALYSIS, MACROSCOPIC AND MICROSCOPIC W/CULTURE REFLEX.  Procedure                               Abnormality         Status                     ---------                               -----------         ------                     URINALYSIS, MACROSCOPIC[[id number]]      Abnormal            Final result               URINALYSIS, MICROSCOPIC[[id number]]                          Final result                 Please view results for these tests on the individual orders.   CBC/DIFF    Narrative:     The following orders were created for panel order CBC/DIFF.  Procedure                               Abnormality         Status                     ---------                               -----------         ------                     CBC WITH IPQQ[[id number]]                Abnormal            Final result               MANUAL DIFFERENTIAL[[id number]]  Abnormal            Final result                 Please view results for these tests on the individual orders.   URINALYSIS, MICROSCOPIC   EXTRA TUBES    Narrative:     The following orders were created for panel order EXTRA TUBES.  Procedure                               Abnormality         Status                     ---------                               -----------         ------                     GOLD TOP ULAZ[[id number]]                                    In process                   Please view results for these tests on the individual orders.   GOLD TOP TUBE   PERFORM POC WHOLE BLOOD GLUCOSE       CT  CHEST FOR PULMONARY EMBOLUS W IV CONTRAST   Final Result by Edi, Radresults In (09/26 0417)   No pulmonary embolism.      Mild emphysema and bronchitis. No focal pneumonia.                  Radiologist location ID: [id number]         XR CHEST PA AND LATERAL   Final Result by Edi, Radresults In (09/26 0252)   Bronchitis. No focal pneumonia.               Radiologist location ID: [facility]             Medical Decision Making          Medical Decision Making  Amount and/or Complexity of Data Reviewed  Labs: ordered. Decision-making details documented in ED Course.  Radiology: ordered and independent interpretation performed. Decision-making details documented in ED Course.  ECG/medicine tests: ordered and independent interpretation performed. Decision-making details documented in ED Course.    Risk  OTC drugs.  Prescription drug management.    Critical Care  Total time providing critical care: 0 minutes        Medical Decision Making  A 58 year old female with a history of COPD, tobacco use, and prior DVT presented with four weeks of progressive shortness of breath and nonproductive cough. She reported recent use of albuterol  inhaler, no current wheezing, and no need for supplemental oxygen, with an oxygen saturation of 98% on room air. Exam was notable for clear lungs and normal heart sounds; labs revealed hyperglycemia and a positive rapid strep test. Imaging and blood gas were performed, and she was found to have mild bronchitis and acute streptococcal pharyngitis.    Differential diagnosis includes, but is not limited to:  - Acute Bronchitis with COPD Exacerbation: Considered due to four weeks of cough and dyspnea, peribronchial thickening  on chest x-ray, and mild bronchitis on CTA; absence of wheezing and normal oxygenation made acute severe exacerbation less likely.  - Pulmonary Embolism: Considered due to history of DVT and elevated D-dimer; ruled out by negative CTA of the chest.  - Acute Streptococcal  Pharyngitis: Considered and confirmed by positive rapid strep test despite absence of sore throat.    Acute bronchitis with COPD  - Advised to use albuterol  inhaler as needed for shortness of breath or wheezing  - Strongly advised to stop smoking    Acute streptococcal pharyngitis  - Administered Augmentin 875 mg PO in the emergency department  - Prescribed Augmentin 875 mg BID for 10 days    Type 2 diabetes mellitus, uncontrolled  - Administered insulin  8 units subcutaneously  - Advised to closely monitor blood sugar levels  - Advised to take all regular diabetes medications as prescribed    Discharge disposition  - Discharged home with instructions to follow up with primary care provider next week    ED Course as of 07/09/24 0456   Fri Jul 09, 2024   0249 EKG shows normal sinus rhythm with a heart rate of 76, normal axis, normal R-wave progression, no ectopy, no ischemia, Q-wave noted in lead 3 (less than 1/3).  Normal PR/QRS interval.  EKG unchanged from March 19, 2024   0253 Two-view chest:  FINDINGS:  The heart size is normal.  The mediastinal contour is unremarkable.  Peribronchial thickening. No acute airspace consolidation.  No pneumothorax or pleural effusion.  Right axillary surgical clips. Degenerative changes.        IMPRESSION:  Bronchitis. No focal pneumonia.        0254 Arterial blood gas shows show pH shows 7.5 with a pCO2 of 22, PaO2 of 91, bicarb 25.  O2 sat 98.2 with a lactic of 1.8 on room air   0254 CBC does show hemoglobin of 15.4 otherwise unremarkable.  Lactic 1.4.  PT/INR/PTT are normal range.  D-dimer 922   0305 Rapid strep is positive   0338 Glucose is 389.  Patient ordered insulin  8 units subQ.   0422 CTA chest:     IMPRESSION:  No pulmonary embolism.     Mild emphysema and bronchitis. No focal pneumonia.     0426 COVID/flu/RSV were negative.   9573 Rapid strep positive.  Patient started on Augmentin 875 b.i.d. times 10 days.  Initial dose given here in emergency room.   9557 Repeat  fingerstick is 247.  Patient be discharged home.       Results  LABS  Arterial Blood Gas: pH 7.58, PCO2 22, PO2 91, O2 saturation 98.2% on room air (07/08/2024)  D-dimer: 922 (07/08/2024)  Sodium: 132 (07/08/2024)  Bicarbonate: 17 (07/08/2024)  BUN: 8 (07/08/2024)  Creatinine: 0.58 (07/08/2024)  Glucose: 389 (07/08/2024)  UDS: Positive for cannabinoids (07/08/2024)  Magnesium : 2.0 (07/08/2024)  Lactic Acid: 1.4 (07/08/2024)  Hydroxybutyrate: 1.06 (07/08/2024)  Troponin: 4 (07/08/2024)  BNP: 14 (07/08/2024)  PT/INR/PTT: Low range (07/08/2024)    RADIOLOGY  Chest CT: Peribronchial thickening, no appreciable consolidation, findings consistent with bronchitis, no appreciable infiltrate/pneumonia (07/08/2024)  Chest CTA: No acute infiltrate, no acute disease process, mild bronchitis, no pulmonary embolism (07/08/2024)    DIAGNOSTIC  EKG: Normal sinus rhythm, heart rate 76, normal axis, normal cardiac function, no atrial premature complexes, no ischemia, Q wave noted in lead III (less than one third of QRS), normal PR interval, QRS interval, unchanged from 04/07/2024 (07/08/2024)    PATHOLOGY  Rapid Strep: Positive (  07/08/2024)    Medications Ordered/Administered in the ED   iopamidol (ISOVUE-370) 76% infusion (75 mL Intravenous Given 07/09/24 0332)   insulin  R human 100 units/mL injection (8 Units Subcutaneous Given 07/09/24 0349)   amoxicillin -clavulanate (AUGMENTIN) 875-125mg  per tablet (1 Tablet Oral Given 07/09/24 0445)       Following the history, physical exam, and ED workup, the patient was deemed stable and suitable for discharge. The patient/caregiver was advised to return to the ED for any new or worsening symptoms. Discharge medications, and follow-up instructions were discussed with the patient/caregiver in detail, who verbalizes understanding. The patient/caregiver is in agreement and is comfortable with the plan of care.    Disposition: Discharged         Current Discharge Medication List        START taking  these medications.        Details   amoxicillin -pot clavulanate 875-125 mg Tablet  Commonly known as: AUGMENTIN   1 Tablet, Oral, 2 TIMES DAILY  Qty: 20 Tablet  Refills: 0            CONTINUE these medications - NO CHANGES were made during your visit.        Details   Accu-Chek Guide Me Glucose Mtr Misc  Generic drug: Blood-Glucose Meter   4 TIMES DAILY  Refills: 0     Accu-Chek Guide test strips Strip  Generic drug: Blood Sugar Diagnostic   2 TIMES DAILY  Refills: 0     Accu-Chek Softclix Lancets Misc  Generic drug: Lancets   2 TIMES DAILY  Refills: 0     acetaminophen  500 mg Tablet  Commonly known as: TYLENOL    1,000 mg, Oral, EVERY 6 HOURS PRN  Qty: 60 Tablet  Refills: 0     * albuterol  sulfate 2.5 mg /3 mL (0.083 %) nebulizer solution  Commonly known as: PROVENTIL    2.5 mg, Nebulization, EVERY 4 HOURS PRN  Qty: 30 Each  Refills: 0     * albuterol  sulfate 90 mcg/actuation oral inhaler  Commonly known as: PROVENTIL  or VENTOLIN  or PROAIR    1-2 Puffs, Inhalation, EVERY 6 HOURS PRN  Qty: 90 g  Refills: 0     aspirin  81 mg Tablet, Delayed Release (E.C.)  Commonly known as: ECOTRIN   81 mg, Daily  Refills: 0     azithromycin  250 mg Tablet  Commonly known as: ZITHROMAX    Take 500 mg (2 tab) on day 1; take 250 mg (1 tab) on days 2-5.  Qty: 6 Tablet  Refills: 0     BD UF Mini Pen Needle 31 gauge x 3/16 Needle  Generic drug: Pen Needle (Disposable)   SMARTSIG:SUB-Q  Refills: 0     empagliflozin 25 mg Tablet  Commonly known as: JARDIANCE   25 mg, Oral, Daily  Refills: 0     ergocalciferol (vitamin D2) 1,250 mcg (50,000 unit) Capsule  Commonly known as: DRISDOL   50,000 Units, EVERY 7 DAYS  Refills: 0     FreeStyle Libre 14 Day Reader Misc  Generic drug: flash glucose scanning reader   USE TO CHECK GLUCOSE AS DIRECTED 4 TIMES DAILY  Refills: 0     * gabapentin  800 mg Tablet  Commonly known as: NEURONTIN    800 mg, 3 TIMES DAILY  Refills: 0     * gabapentin  100 mg Capsule  Commonly known as: NEURONTIN    100 mg, 3 TIMES  DAILY  Refills: 0     Insulin  Syringe-Needle U-100 0.3 mL 29  gauge x 1/2 Syringe   Daily  Refills: 0     Nurtec ODT 75 mg Tablet, Rapid Dissolve  Generic drug: rimegepant   75 mg, EVERY 24 HOURS PRN  Refills: 0     ondansetron  4 mg Tablet, Rapid Dissolve  Commonly known as: ZOFRAN  ODT   4 mg, 4 TIMES DAILY PRN  Refills: 0     ramipriL 10 mg Capsule  Commonly known as: ALTACE   10 mg, Oral, Daily  Refills: 0     simvastatin 10 mg Tablet  Commonly known as: ZOCOR   SMARTSIG:1 Tablet(s) By Mouth Every Evening  Refills: 0     Toujeo  Max U-300 SoloStar 300 unit/mL (3 mL) Insulin  Pen  Generic drug: insulin  glargine U-300 conc   25 Units, Subcutaneous, Daily  Qty: 3 mL  Refills: 0           * This list has 4 medication(s) that are the same as other medications prescribed for you. Read the directions carefully, and ask your doctor or other care provider to review them with you.                ASK your doctor about these medications.        Details   doxycycline  hyclate 100 mg Capsule  Commonly known as: VIBRAMYCIN   Ask about: Should I take this medication?   100 mg, Oral, 2 TIMES DAILY  Qty: 20 Capsule  Refills: 0     predniSONE  50 mg Tablet  Commonly known as: DELTASONE   Ask about: Should I take this medication?   50 mg, Oral, Daily  Qty: 5 Tablet  Refills: 0            Follow up:   Peters, Jana, DO  [address]  [place] [address]  [phone]      Tuesday/Wednesday                   Clinical Impression   Strep pharyngitis (Primary)   Bronchitis   Uncontrolled type 2 diabetes mellitus with hyperglycemia (CMS HCC)   Tobacco abuse         Current Discharge Medication List        START taking these medications    Details   amoxicillin -pot clavulanate (AUGMENTIN) 875-125 mg Oral Tablet Take 1 Tablet by mouth Twice daily for 10 days  Qty: 20 Tablet, Refills: 0             R.A. Keeghan Bialy, DO  Department of Emergency Medicine

## 2024-07-09 NOTE — Respiratory Therapy (Signed)
 Latest Reference Range & Units 07/09/24 02:44   %FIO2 (ARTERIAL) % 21   PH 7.35 - 7.45  7.58 (H)   PCO2 35 - 45 mm/Hg 22 (L)   PO2 83 - 108 mm/Hg 91   BICARBONATE 21.0 - 28.0 mmol/L 25.4   BASE EXCESS 0.0 - 3.0 mmol/L 0.6   PAO2/FIO2 RATIO  433   O2 SATURATION (ARTERIAL) 94.0 - 98.0 % 98.2   LACTATE <=1.9 mmol/L 1.8   (H): Data is abnormally high  (L): Data is abnormally low    ABG handed to DR. Petrarca

## 2024-07-14 LAB — ADULT ROUTINE BLOOD CULTURE, SET OF 2 BOTTLES (BACTERIA AND YEAST)
BLOOD CULTURE, ROUTINE: NO GROWTH
BLOOD CULTURE, ROUTINE: NO GROWTH

## 2024-09-04 ENCOUNTER — Emergency Department
Admission: EM | Admit: 2024-09-04 | Discharge: 2024-09-04 | Disposition: A | Discharge status: Home / Self Care | Payer: MEDICAID

## 2024-09-04 ENCOUNTER — Other Ambulatory Visit: Payer: Self-pay

## 2024-09-04 ENCOUNTER — Encounter (HOSPITAL_COMMUNITY): Payer: Self-pay

## 2024-09-04 ENCOUNTER — Emergency Department (HOSPITAL_COMMUNITY): Payer: MEDICAID

## 2024-09-04 DIAGNOSIS — M25511 Pain in right shoulder: Secondary | ICD-10-CM | POA: Insufficient documentation

## 2024-09-04 DIAGNOSIS — G8929 Other chronic pain: Secondary | ICD-10-CM

## 2024-09-04 MED ORDER — DEXAMETHASONE SODIUM PHOSPHATE (PF) 10 MG/ML INJECTION SOLUTION
INTRAMUSCULAR | Status: AC
Start: 2024-09-04 — End: 2024-09-04
  Filled 2024-09-04: qty 1

## 2024-09-04 MED ORDER — KETOROLAC 60 MG/2 ML INTRAMUSCULAR SOLUTION
INTRAMUSCULAR | Status: AC
Start: 2024-09-04 — End: 2024-09-04
  Filled 2024-09-04: qty 2

## 2024-09-04 MED ORDER — DEXAMETHASONE SODIUM PHOSPHATE (PF) 10 MG/ML INJECTION SOLUTION
10.0000 mg | INTRAMUSCULAR | Status: AC
Start: 2024-09-04 — End: 2024-09-04
  Administered 2024-09-04: 10 mg via INTRAMUSCULAR

## 2024-09-04 MED ORDER — KETOROLAC 60 MG/2 ML INTRAMUSCULAR SOLUTION
60.0000 mg | INTRAMUSCULAR | Status: AC
Start: 2024-09-04 — End: 2024-09-04
  Administered 2024-09-04: 60 mg via INTRAMUSCULAR

## 2024-09-04 NOTE — Discharge Instructions (Signed)
 Continue all of your home medications.  Follow up with PCP and/or ortho if needed.
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
## 2024-09-04 NOTE — ED Nurses Note (Signed)

## 2024-09-27 ENCOUNTER — Emergency Department: Admission: EM | Admit: 2024-09-27 | Discharge: 2024-09-28 | Disposition: A | Payer: MEDICAID

## 2024-09-27 ENCOUNTER — Emergency Department (HOSPITAL_COMMUNITY): Payer: MEDICAID

## 2024-09-27 ENCOUNTER — Encounter (HOSPITAL_COMMUNITY): Payer: Self-pay

## 2024-09-27 ENCOUNTER — Other Ambulatory Visit: Payer: Self-pay

## 2024-09-27 DIAGNOSIS — R748 Abnormal levels of other serum enzymes: Secondary | ICD-10-CM

## 2024-09-27 DIAGNOSIS — I498 Other specified cardiac arrhythmias: Secondary | ICD-10-CM | POA: Insufficient documentation

## 2024-09-27 DIAGNOSIS — M5432 Sciatica, left side: Secondary | ICD-10-CM | POA: Insufficient documentation

## 2024-09-27 DIAGNOSIS — M5430 Sciatica, unspecified side: Secondary | ICD-10-CM

## 2024-09-27 DIAGNOSIS — M25552 Pain in left hip: Secondary | ICD-10-CM

## 2024-09-27 DIAGNOSIS — R0602 Shortness of breath: Secondary | ICD-10-CM

## 2024-09-27 DIAGNOSIS — R079 Chest pain, unspecified: Secondary | ICD-10-CM

## 2024-09-27 DIAGNOSIS — R0789 Other chest pain: Secondary | ICD-10-CM

## 2024-09-27 DIAGNOSIS — R1013 Epigastric pain: Secondary | ICD-10-CM | POA: Insufficient documentation

## 2024-09-27 DIAGNOSIS — M545 Low back pain, unspecified: Secondary | ICD-10-CM

## 2024-09-27 LAB — COMPREHENSIVE METABOLIC PANEL, NON-FASTING
ALBUMIN/GLOBULIN RATIO: 1.4 (ref 0.8–1.4)
ALBUMIN: 4.2 g/dL (ref 3.5–5.7)
ALKALINE PHOSPHATASE: 144 U/L — ABNORMAL HIGH (ref 34–104)
ALT (SGPT): 18 U/L (ref 7–52)
ANION GAP: 9 mmol/L (ref 4–13)
AST (SGOT): 17 U/L (ref 13–39)
BILIRUBIN TOTAL: 0.5 mg/dL (ref 0.3–1.0)
BUN/CREA RATIO: 18 (ref 6–22)
BUN: 10 mg/dL (ref 7–25)
CALCIUM, CORRECTED: 9 mg/dL (ref 8.9–10.8)
CALCIUM: 9.2 mg/dL (ref 8.6–10.3)
CHLORIDE: 99 mmol/L (ref 98–107)
CO2 TOTAL: 25 mmol/L (ref 21–31)
CREATININE: 0.57 mg/dL — ABNORMAL LOW (ref 0.60–1.30)
ESTIMATED GFR: 107 mL/min/1.73mˆ2 (ref >59)
GLOBULIN: 3.1 (ref 2.0–3.5)
GLUCOSE: 397 mg/dL — ABNORMAL HIGH (ref 74–109)
OSMOLALITY, CALCULATED: 282 mosm/kg (ref 270–290)
POTASSIUM: 4.1 mmol/L (ref 3.5–5.1)
PROTEIN TOTAL: 7.3 g/dL (ref 6.4–8.9)
SODIUM: 133 mmol/L — ABNORMAL LOW (ref 136–145)

## 2024-09-27 LAB — PT/INR
INR: 0.98 (ref 0.84–1.10)
PROTHROMBIN TIME: 11.1 s (ref 9.8–12.7)

## 2024-09-27 LAB — CBC WITH DIFF
BASOPHIL #: 0.1 x10ˆ3/uL (ref 0.00–0.10)
BASOPHIL %: 1 % (ref 0–1)
EOSINOPHIL #: 0.2 x10ˆ3/uL (ref 0.00–0.50)
EOSINOPHIL %: 2 % (ref 1–7)
HCT: 43.7 % — ABNORMAL HIGH (ref 31.2–41.9)
HGB: 15.7 g/dL — ABNORMAL HIGH (ref 10.9–14.3)
LYMPHOCYTE #: 2.8 x10ˆ3/uL (ref 1.10–3.10)
LYMPHOCYTE %: 34 % (ref 16–46)
MCH: 32.7 pg (ref 24.7–32.8)
MCHC: 35.9 g/dL — ABNORMAL HIGH (ref 32.3–35.6)
MCV: 90.9 fL (ref 75.5–95.3)
MONOCYTE #: 0.6 x10ˆ3/uL (ref 0.20–0.90)
MONOCYTE %: 7 % (ref 4–11)
MPV: 8 fL (ref 7.9–10.8)
NEUTROPHIL #: 4.8 x10ˆ3/uL (ref 1.90–8.20)
NEUTROPHIL %: 57 % (ref 43–77)
PLATELETS: 244 x10ˆ3/uL (ref 140–440)
RBC: 4.81 x10ˆ6/uL (ref 3.63–4.92)
RDW: 13.3 % (ref 12.3–17.7)
WBC: 8.4 x10ˆ3/uL (ref 3.8–11.8)

## 2024-09-27 LAB — LIPASE: LIPASE: 251 U/L — ABNORMAL HIGH (ref 11–82)

## 2024-09-27 LAB — D-DIMER: D-DIMER: 405 ng{FEU}/mL (ref 215–500)

## 2024-09-27 LAB — TROPONIN-I
TROPONIN I: 4 ng/L (ref <15)
TROPONIN I: 4 ng/L (ref <15)
TROPONIN I: 4 ng/L (ref <15)

## 2024-09-27 LAB — PTT (PARTIAL THROMBOPLASTIN TIME): APTT: 28 s (ref 25.0–38.0)

## 2024-09-27 MED ORDER — FENTANYL (PF) 50 MCG/ML INJECTION SOLUTION
INTRAMUSCULAR | Status: AC
  Filled 2024-09-27: qty 2

## 2024-09-27 MED ORDER — FENTANYL (PF) 50 MCG/ML INJECTION SOLUTION
50.0000 ug | INTRAMUSCULAR | Status: AC
  Administered 2024-09-27: 50 ug via INTRAVENOUS

## 2024-09-27 MED ORDER — IOHEXOL 350 MG IODINE/ML INTRAVENOUS SOLUTION
50.0000 mL | INTRAVENOUS | Status: AC
  Administered 2024-09-27: 50 mL via INTRAVENOUS

## 2024-09-27 NOTE — ED Triage Notes (Signed)
 [facility] - Emergency Department   Physician/APP in Triage Note  Medical Screening Exam     Date and Time of Assessment: 09/27/2024 19:42     Chief Complaint   Patient presents with    Chest Pain     Hip Pain       Brief HPI: Left parasternal chest pain since 11am while laying down, nonradiating, intermittent, sharp that began today. She reports a little associated shortness of breath and says both legs became weak around 2pm.  Significant other says patient has had frequent belching today. Tried Pepcid  and Protonix  without relief.  Left hip pain for about a month.  Hx breast CA with mastectomy.    Focused Physical Exam:   ED Triage Vitals [09/27/24 1945]   BP (Non-Invasive) 126/80   Heart Rate 74   Respiratory Rate 18   Temperature 36.5 C (97.7 F)   SpO2 100 %   Weight 51.7 kg (114 lb)   Height 1.575 m (5' 2)     WDWN female in NAD. Respirations even and unlabored.    Preliminary Plan:  Labs ordered  EKG ordered  Imaging ordered  Patient will return to waiting room

## 2024-09-27 NOTE — ED Provider Notes (Signed)
 [facility]  ED Primary Provider Note  Patient Name: Gail Morris  Patient Age: 58 y.o.  Date of Birth: 25-May-1966    Chief Complaint: Chest Pain  and Hip Pain              Historical Data   History Reviewed This Encounter: Medical History  Surgical History  Family History  Social History    ED Triage Vitals [09/27/24 1945]   BP (Non-Invasive) 126/80   Heart Rate 74   Respiratory Rate 18   Temperature 36.5 C (97.7 F)   SpO2 100 %   Weight 51.7 kg (114 lb)   Height 1.575 m (5' 2)     {Be sure to review and modify Physical Exam as appropriate. As of Oct 14, 2021 you are only required to have a medically appropriate ROS and PE for billing purposes. This help text will disappear when signing your note.:123}      History of Present Illness   had concerns including Chest Pain  and Hip Pain.     Gail Morris is a 58 y.o. female                 Procedures      Patient Data   {Click here to open the ED Workup Activity for clinical data review *This link will automatically disappear upon signing your note*:123}  Labs Ordered/Reviewed   COMPREHENSIVE METABOLIC PANEL, NON-FASTING - Abnormal; Notable for the following components:       Result Value    SODIUM 133 (*)     CREATININE 0.57 (*)     GLUCOSE 397 (*)     ALKALINE PHOSPHATASE 144 (*)     All other components within normal limits    Narrative:     Estimated Glomerular Filtration Rate (eGFR) is calculated using the CKD-EPI (2021) equation, intended for patients 90 years of age and older. If gender is not documented or unknown, there will be no eGFR calculation.     LIPASE - Abnormal; Notable for the following components:    LIPASE 251 (*)     All other components within normal limits   CBC WITH DIFF - Abnormal; Notable for the following components:    HGB 15.7 (*)     HCT 43.7 (*)     MCHC 35.9 (*)     All other components within normal limits   PT/INR - Normal    Narrative:     In the setting of warfarin therapy, a  moderate-intensity INR goal range is 2.0 to 3.0 and a high-intensity INR goal range is 2.5 to 3.5.    INR is ONLY validated to determine the level of anticoagulation with vitamin K antagonists (warfarin). Other factors may elevate the INR including but not limited to direct oral anticoagulants (DOACs), liver dysfunction, vitamin K deficiency, DIC, factor deficiencies, and factor inhibitors.   PTT (PARTIAL THROMBOPLASTIN TIME) - Normal   TROPONIN-I - Normal   TROPONIN-I - Normal   TROPONIN-I - Normal   D-DIMER - Normal    Narrative:     D-Dimers are reported in FEU per ng/mL.    IF PATIENT IS EXHIBITING SYMPTOMS DVT/PE, THIS D-DIMER RESULT MAY INDICATE A NEED FOR FURTHER TESTING FOR THESE CONDITIONS. IF PATIENT IS SUSPECTED OF DIC AND SYMPTOMS WORSEN OR PERSIST, A REPEAT DIC WORKUP SHOULD BE CONSIDERED.    NOTE: ALTHOUGH THE NORMAL RANGE FOR THIS TEST IS 215-500 ng/mL FEU, LITERATURE RECOMMENDS FURTHER TESTING FOR  ANY RESULT >500ng/mL FEU.    A cut off value of 500ng/mL FEU or below can be used as an aid in the diagnosis of Thromboembolism when used in conjunction with the patient's medical history, clinical presentation and other findings. Results of 500ng/mL FEU or below have a negative predictive value of 100%.     CBC/DIFF    Narrative:     The following orders were created for panel order CBC/DIFF.  Procedure                               Abnormality         Status                     ---------                               -----------         ------                     CBC WITH DIFF[[id number]]                Abnormal            Final result                 Please view results for these tests on the individual orders.   GRAY TOP TUBE   EXTRA TUBES    Narrative:     The following orders were created for panel order EXTRA TUBES.  Procedure                               Abnormality         Status                     ---------                               -----------         ------                     GOLD TOP  TUBE[[id number]]                                    In process                 GRAY TOP TUBE[[id number]]                                    Final result                 Please view results for these tests on the individual orders.   GOLD TOP TUBE       CT ABDOMEN PELVIS W IV CONTRAST   Final Result by Edi, Radresults In (12/15 2301)   No acute findings in the abdomen or pelvis.      Incidental findings as above.         Radiologist location ID: [id number]         XR CHEST PA AND  LATERAL   Final Result by Edi, Radresults In (12/15 2004)   NO ACUTE FINDINGS.         Radiologist location ID: [id number]               ED clinical impression missing, please click on the following link to add Clinical Impressions ***  then refresh the note prior to signing.                                 Medications Ordered/Administered in the ED   fentaNYL  (SUBLIMAZE ) 50 mcg/mL injection (50 mcg Intravenous Given 09/27/24 2103)   iohexol  (OMNIPAQUE  350) solution (50 mL Intravenous Given 09/27/24 2241)       {Disposition:38159}        Risk as noted in the medical decision-making is in reference to potential morbidity/mortality of management based upon previously established billing guidelines.    Based upon the clinical setting, the likely diagnosis/impression include:    ED clinical impression missing, please click on the following link to add Clinical Impressions ***  then refresh the note prior to signing.      Current Discharge Medication List                This note was partially created using voice recognition software and is inherently subject to errors including those of syntax and sound alike  substitutions which may escape proof reading. In such instances, original meaning may be extrapolated by contextual derivation.                    {Remember to refresh your note prior to signing. Use Control + F11 or click the refresh button at the bottom of the note. This reminder text will automatically disappear when you sign your  note.:123}

## 2024-09-27 NOTE — ED Triage Notes (Signed)
 Epigastric CP that began this morning around 1100 that pt describes as sharp, SOB and weakness. Pt took Pepcid  and Protonix  with no relief. Also c/o left hip pain x 1 month.

## 2024-09-28 DIAGNOSIS — I498 Other specified cardiac arrhythmias: Secondary | ICD-10-CM

## 2024-09-28 LAB — ECG 12 LEAD
Atrial Rate: 76 {beats}/min
Calculated P Axis: 74 degrees
Calculated R Axis: 57 degrees
Calculated T Axis: 65 degrees
PR Interval: 168 ms
QRS Duration: 80 ms
QT Interval: 392 ms
QTC Calculation: 441 ms
Ventricular rate: 76 {beats}/min

## 2024-09-28 MED ORDER — PANTOPRAZOLE 40 MG INTRAVENOUS SOLUTION
40.0000 mg | INTRAVENOUS | Status: AC
  Administered 2024-09-28: 40 mg via INTRAVENOUS

## 2024-09-28 MED ORDER — PANTOPRAZOLE 40 MG INTRAVENOUS SOLUTION
INTRAVENOUS | Status: AC
  Filled 2024-09-28: qty 10

## 2024-09-28 MED ORDER — PREDNISONE 20 MG TABLET
60.0000 mg | ORAL_TABLET | ORAL | Status: AC
  Administered 2024-09-28: 60 mg via ORAL

## 2024-09-28 MED ORDER — PREDNISONE 20 MG TABLET
ORAL_TABLET | ORAL | Status: AC
  Filled 2024-09-28: qty 3

## 2024-09-28 MED ORDER — LIDOCAINE 4 % TOPICAL PATCH
MEDICATED_PATCH | CUTANEOUS | Status: AC
  Filled 2024-09-28: qty 1

## 2024-09-28 MED ORDER — LIDOCAINE 4 % TOPICAL PATCH
1.0000 | MEDICATED_PATCH | CUTANEOUS | Status: DC
  Administered 2024-09-28: 1 via TRANSDERMAL

## 2024-09-28 NOTE — ED Notes (Signed)
 [facility] - Emergency Department  Peer Recovery Coach Assessment    Initial Evaluation  Referred by:: Nurse  Location of Evaluation: Emergency Department  How many times in the last 12 months have you been to the ED?: 6 or more  Have you ever served or are you currently serving in the Armed Forces?: No             Substance Use History  Patient current substance use status: Missed pt on 12/15 @ 7:35pm.  Patient self discloses the use of marijuana in the past 12 months. Harm reduction and safe practices discussed, pt dc'd home.    Prior treatment history?: No                             Family, Social, Home & Safety History                                                 Employment            Engagement  Readiness ruler: 1    Brief Intervention  Discussed plan to reduce/quit substance use?: Yes  Discussed willingness to enter treatment?: No  Indicated patient's stage of change:: 1 - Precontemplation    Patient seen by Peer Recovery Coach and is a candidate for buprenorphine  administration in the ED. Patient needs assessment for bup treatment.: No    Plan  Was the patient referred to treatment?: No    Was patient referred to physician for Buprenorphine  Assessment in the ED?: No    Did patient receive Narcan in the ED?: No         Follow-up           Need for additional follow-up?: No       Hunter Free, Peer Recovery Coach 09/28/2024 10:54

## 2024-09-28 NOTE — Discharge Instructions (Signed)
 See handouts for details, take Protonix  30 minutes before eating or drinking in the morning on an empty stomach, avoid fatty spicy citrus the greasy foods and adhere to a bland diet, avoid alcohol tobacco and NSAIDs.  Apply lidocaine  patch over the left chest wall for 12 hours on and 12 hours off throughout the day, take steroids as prescribed, please follow up with your primary care hand gastroenterologist.  For any new or worsening symptoms or any other concerns please seek medical attention.

## 2024-09-28 NOTE — ED Nurses Note (Signed)

## 2024-10-17 ENCOUNTER — Other Ambulatory Visit: Payer: Self-pay

## 2024-10-17 ENCOUNTER — Emergency Department
Admission: EM | Admit: 2024-10-17 | Discharge: 2024-10-18 | Disposition: A | Discharge status: Home / Self Care | Payer: MEDICAID | Attending: Emergency Medicine | Admitting: Emergency Medicine

## 2024-10-17 ENCOUNTER — Emergency Department (HOSPITAL_COMMUNITY): Payer: MEDICAID

## 2024-10-17 DIAGNOSIS — B9789 Other viral agents as the cause of diseases classified elsewhere: Secondary | ICD-10-CM | POA: Insufficient documentation

## 2024-10-17 DIAGNOSIS — H6592 Unspecified nonsuppurative otitis media, left ear: Secondary | ICD-10-CM | POA: Insufficient documentation

## 2024-10-17 DIAGNOSIS — R059 Cough, unspecified: Secondary | ICD-10-CM

## 2024-10-17 DIAGNOSIS — R519 Headache, unspecified: Secondary | ICD-10-CM

## 2024-10-17 DIAGNOSIS — J069 Acute upper respiratory infection, unspecified: Secondary | ICD-10-CM | POA: Insufficient documentation

## 2024-10-17 DIAGNOSIS — F17210 Nicotine dependence, cigarettes, uncomplicated: Secondary | ICD-10-CM | POA: Insufficient documentation

## 2024-10-17 LAB — COVID-19, FLU A/B, RSV RAPID BY PCR
INFLUENZA VIRUS TYPE A: NOT DETECTED
INFLUENZA VIRUS TYPE B: NOT DETECTED
RESPIRATORY SYNCYTIAL VIRUS (RSV): NOT DETECTED
SARS-CoV-2: NOT DETECTED

## 2024-10-17 NOTE — ED Triage Notes (Signed)
 Pt. C/O Left sided ear pain with cough, congestion, and headache.

## 2024-10-17 NOTE — ED Triage Notes (Signed)
 [facility] - Emergency Department    Gail Morris is a 59 y.o. female presenting to the ED today for:  Ear Pain    Additional History:     Patient with c/o left earache, cough, congestion for the past several days. Reports URI s/s.       Pertinent Exam findings:  Filed Vitals:    10/17/24 2250   BP: 126/68   Pulse: 87   Resp: 18   Temp: 36.5 C (97.7 F)   SpO2: 100%       Gen: Nontoxic in appearance.  Head: NC/AT  Neck: Trachea midline. No gross meningismus.  Psych: Denis SI/HI.    Assessment: Medical Screening Exam.    Plan/MDM at Triage:  I have considered labs, imaging, EKG.  These have been ordered as clinically indicated.  I have considered that the patient may need medications.  However, due to treatment space limitation, medications considered have to be appropriate for the waiting room.    I have considered the patient's chronic conditions and have counseled the patient that we will be evaluating for the presence of an emergency medical condition today in which their chronic conditions may or may not play a role.  I have advised that the patient should notify their PCP of today's visit regardless of disposition.  I have considered the patient's social determinants of health and health care literacy while determining the patient's initial plan of care.  I have further advised that the patient will possibly see a 2nd emergency provider today during their stay to help promote throughput.    I have performed an initial medical screening evaluation in order to determine if the patient has an emergency medical condition. The patient has been evaluated.  Imminent life, limb, or sight threatening emergency will be taken to the treatment area immediately. The patient's orders will be reviewed by nursing staff and placed in the treatment area as soon as possible as bed capacity allows.  Should the patient's status change, or a new sign/symptom develop, immediate  notification of nursing or ED staff has been advised.

## 2024-10-18 ENCOUNTER — Other Ambulatory Visit: Payer: Self-pay

## 2024-10-18 ENCOUNTER — Encounter (HOSPITAL_COMMUNITY): Payer: Self-pay

## 2024-10-18 MED ORDER — ACETAMINOPHEN 325 MG TABLET
650.0000 mg | ORAL_TABLET | ORAL | Status: AC
  Administered 2024-10-18: 650 mg via ORAL

## 2024-10-18 MED ORDER — AMOXICILLIN 875 MG-POTASSIUM CLAVULANATE 125 MG TABLET
ORAL_TABLET | ORAL | Status: AC
  Filled 2024-10-18: qty 1

## 2024-10-18 MED ORDER — AMOXICILLIN 875 MG-POTASSIUM CLAVULANATE 125 MG TABLET: 1.0000 | ORAL_TABLET | Freq: Two times a day (BID) | ORAL | 0 refills | Status: AC

## 2024-10-18 MED ORDER — AMOXICILLIN 875 MG-POTASSIUM CLAVULANATE 125 MG TABLET
1.0000 | ORAL_TABLET | ORAL | Status: AC
  Administered 2024-10-18: 1 via ORAL

## 2024-10-18 MED ORDER — ACETAMINOPHEN 325 MG TABLET
ORAL_TABLET | ORAL | Status: AC
  Filled 2024-10-18: qty 2

## 2024-10-18 NOTE — ED Notes (Signed)
 [facility] - Emergency Department  Peer Recovery Coach Assessment    Initial Evaluation  Referred by:: Nurse  Location of Evaluation: Emergency Department  How many times in the last 12 months have you been to the ED?: 1  Have you ever served or are you currently serving in the Armed Forces?: No             Substance Use History  Patient current substance use status: Missed pt on 12/15 @ 7:35pm. Patient self discloses the use of marijuana in the past 12 months. Harm reduction and safe practices discussed, pt dc'd home.                                  Family, Social, Home & Safety History                                                 Employment            Engagement  Readiness ruler: 1    Brief Intervention  Discussed plan to reduce/quit substance use?: Yes  Discussed willingness to enter treatment?: No  Indicated patient's stage of change:: 1 - Precontemplation    Patient seen by Peer Recovery Coach and is a candidate for buprenorphine  administration in the ED. Patient needs assessment for bup treatment.: No    Plan  Was the patient referred to treatment?: No    Was patient referred to physician for Buprenorphine  Assessment in the ED?: No    Did patient receive Narcan in the ED?: No         Follow-up           Need for additional follow-up?: No       Hunter Free, Peer Recovery Coach 10/18/2024 08:44

## 2024-10-18 NOTE — ED Nurses Note (Signed)

## 2024-10-18 NOTE — Discharge Instructions (Signed)
 VISIT SUMMARY:  You came in with ear pain, headache, and eye discomfort. We found an infection in your left ear and started treatment for it. Your recent tests for COVID-19, influenza, and RSV were negative, and your chest x-ray showed no signs of pneumonia.    YOUR PLAN:  ACUTE OTITIS MEDIA WITH ASSOCIATED VIRAL UPPER RESPIRATORY INFECTION AND HEADACHE: You have an ear infection likely following a viral upper respiratory infection, which is also causing your headache and eye discomfort.  -You received an initial dose of Augmentin  in the emergency room.  -Take Augmentin  875 mg twice a day for 10 days.  -You were given acetaminophen  for pain and headache.  -Alternate between Tylenol  and Motrin  every 3-4 hours as needed for discomfort.    TOBACCO USE DISORDER: You are currently smoking about one pack of cigarettes daily and have difficulty quitting.  -Try to cut back on smoking.       Contains text generated by [employer]bridge

## 2024-10-18 NOTE — ED Provider Notes (Signed)
 [facility]  ED Primary Provider Note        Arrival: The patient arrived by Private Vehicle     History of Present Illness   chief complaint  Gail Morris is a 59 y.o. female who had concerns including Ear Pain.  Blood pressure 126/68 with a heart rate of 87, respiratory rate of 18 oxygen saturation 100% on room air.  Afebrile at 97.7.    History of Present Illness  Gail Morris is a 59 year old female with diabetes and recurrent cancer who presents with ear pain and headache.    Otalgia  - Persistent left ear pain for several days  - No identifiable triggers or relief    Cephalalgia and ocular pain  - Headache present  - Eye pain accompanying headache    Infectious disease screening  - Recent testing for COVID-19, influenza, and RSV negative  - Recent chest x-ray showed no evidence of pneumonia  - No known sick contacts    Oncologic history  - History of recurrent cancer: breast cancer twice, skin cancer once    Metabolic disease  - Diabetes mellitus    Substance use  - Smokes approximately one pack of cigarettes daily  - Difficulty quitting smoking    Drug allergies  - Allergic to tramadol, metformin, and Trulicity  - Tramadol causes palpitations and feeling unwell    Review of Systems     No other overt Review of Systems are noted to be positive except noted in the HPI.      Historical Data   History Reviewed This Encounter: Medical History  Surgical History  Family History  Social History      Physical Exam   ED Triage Vitals [10/17/24 2250]   BP (Non-Invasive) 126/68   Heart Rate 87   Respiratory Rate 18   Temperature 36.5 C (97.7 F)   SpO2 100 %   Weight 51.7 kg (114 lb)   Height 1.575 m (5' 2)         Exam:   Constitutional:  Patient alert orient x3 in no apparent distress.  No limitations.  Head: Atraumatic normocephalic  Eyes :  Pupils are equal round reactive to light and accommodation extraocular muscles are intact.  Sclera and conjunctiva are  unremarkable  Ears:  Left tympanic membrane erythematous, bulging, posterior fluid.  Right tympanic membrane unremarkable.  Bilateral external auditory canals are unremarkable.    Nose:  Nares are patent turbinates are pink and moist  Mouth:  Mucosa is pink and moist without lesions.  Posterior pharynx is pink and moist without hypertrophy/exudate.  Edentulous  Neck:  Soft and supple without palpable lymphadenopathy.  Heart:  Regular rate and rhythm without audible murmur  Lungs:  Clear to auscultation bilaterally without any wheezing/rales/rhonchi  Abdomen:  Soft nontender without any rebound or guarding; positive bowel sounds throughout  Genitalia:  Deferred  Skin:  Warm and dry without lesions.  Normal skin turgor.  Brisk capillary refill distally  Extremities:  Good strength bilaterally with full range of motion of upper and lower extremities.  Neuro:  Alert oriented x3.  Cranial nerves II-XII grossly intact as tested.  Excellent sensation distally over all dermatomes.  Psychiatric:  Patient cooperative, affect appropriate, insight and judgment good          Procedures           Patient Data     Labs Ordered/Reviewed   COVID-19, FLU A/B, RSV RAPID BY  PCR - Normal    Narrative:     Results are for the simultaneous qualitative identification of SARS-CoV-2 (formerly 2019-nCoV), Influenza A, Influenza B, and RSV RNA. These etiologic agents are generally detectable in nasopharyngeal and nasal swabs during the ACUTE PHASE of infection. Hence, this test is intended to be performed on respiratory specimens collected from individuals with signs and symptoms of upper respiratory tract infection who meet Centers for Disease Control and Prevention (CDC) clinical and/or epidemiological criteria for Coronavirus Disease 2019 (COVID-19) testing. CDC COVID-19 criteria for testing on human specimens is available at [facility] webpage information for Healthcare Professionals: Coronavirus Disease 2019 (COVID-19)  ([email or web address].com.au).     False-negative results may occur if the virus has genomic mutations, insertions, deletions, or rearrangements or if performed very early in the course of illness. Otherwise, negative results indicate virus specific RNA targets are not detected, however negative results do not preclude SARS-CoV-2 infection/COVID-19, Influenza, or Respiratory syncytial virus infection. Results should not be used as the sole basis for patient management decisions. Negative results must be combined with clinical observations, patient history, and epidemiological information. If upper respiratory tract infection is still suspected based on exposure history together with other clinical findings, re-testing should be considered.    Test methodology:   Cepheid Xpert Xpress SARS-CoV-2/Flu/RSV Assay real-time polymerase chain reaction (RT-PCR) test on the GeneXpert Dx and Xpert Xpress systems.       XR CHEST PA AND LATERAL   Final Result by Edi, Radresults In (01/04 2310)   No acute cardiopulmonary disease.            Radiologist location ID: [facility]             Medical Decision Making          Medical Decision Making  Amount and/or Complexity of Data Reviewed  Labs: ordered. Decision-making details documented in ED Course.  Radiology: ordered and independent interpretation performed. Decision-making details documented in ED Course.    Risk  OTC drugs.  Prescription drug management.    Critical Care  Total time providing critical care: 0 minutes        Medical Decision Making  A 59 year old woman with a history of diabetes, breast and skin cancer, and chronic tobacco use presented with several days of left ear pain, headache, and eye discomfort. [place] with frequent illness among residents but denies known sick contacts. Examination revealed an infected left ear. COVID, influenza, and RSV tests were negative, and chest x-ray showed no  pneumonia.    Differential diagnosis includes, but is not limited to:  - Acute otitis media: Left ear pain with exam findings of infection is consistent with acute otitis media, likely following a viral upper respiratory infection.  - Viral upper respiratory infection: Recent symptoms and negative COVID, influenza, and RSV tests suggest a viral upper respiratory infection as a contributing factor.  - Bacterial pneumonia: Chest x-ray showed no evidence of pneumonia, making this diagnosis highly unlikely.    Acute otitis media with associated viral upper respiratory infection and headache  - Administered initial dose of Augmentin  in the emergency room.  - Prescribed Augmentin  875 mg BID for 10 days.  - Administered acetaminophen  for pain and headache.  - Advised to alternate Tylenol  and Motrin  every 3-4 hours as needed for discomfort.    Tobacco use disorder  - Advised to try to cut back on smoking.    ED Course as of 10/18/24 0203   [place] Oct 17, 2024   2319 Two-view chest shows no acute infiltrate, no acute disease process   2352 COVID/flu/RSV are negative       Results  LABS  COVID: negative  Flu: negative  RSV: negative    RADIOLOGY  Chest x-ray: no evidence of pneumonia    Medications Ordered/Administered in the ED   amoxicillin -clavulanate (AUGMENTIN ) 875-125mg  per tablet (1 Tablet Oral Given 10/18/24 0203)   acetaminophen  (TYLENOL ) tablet (650 mg Oral Given 10/18/24 0202)       Following the history, physical exam, and ED workup, the patient was deemed stable and suitable for discharge. The patient/caregiver was advised to return to the ED for any new or worsening symptoms. Discharge medications, and follow-up instructions were discussed with the patient/caregiver in detail, who verbalizes understanding. The patient/caregiver is in agreement and is comfortable with the plan of care.    Disposition: Discharged         Current Discharge Medication List        START taking these medications.        Details    amoxicillin -pot clavulanate 875-125 mg Tablet  Commonly known as: AUGMENTIN    1 Tablet, Oral, 2 TIMES DAILY  Qty: 20 Tablet  Refills: 0            CONTINUE these medications - NO CHANGES were made during your visit.        Details   Accu-Chek Guide Me Glucose Mtr Misc  Generic drug: Blood-Glucose Meter   4 TIMES DAILY  Refills: 0     Accu-Chek Guide test strips Strip  Generic drug: Blood Sugar Diagnostic   2 TIMES DAILY  Refills: 0     Accu-Chek Softclix Lancets Misc  Generic drug: Lancets   2 TIMES DAILY  Refills: 0     acetaminophen  500 mg Tablet  Commonly known as: TYLENOL    1,000 mg, Oral, EVERY 6 HOURS PRN  Qty: 60 Tablet  Refills: 0     aspirin  81 mg Tablet, Delayed Release (E.C.)  Commonly known as: ECOTRIN   81 mg, Daily  Refills: 0     azithromycin  250 mg Tablet  Commonly known as: ZITHROMAX    Take 500 mg (2 tab) on day 1; take 250 mg (1 tab) on days 2-5.  Qty: 6 Tablet  Refills: 0     BD UF Mini Pen Needle 31 gauge x 3/16 Needle  Generic drug: Pen Needle (Disposable)   SMARTSIG:SUB-Q  Refills: 0     empagliflozin 25 mg Tablet  Commonly known as: JARDIANCE   25 mg, Oral, Daily  Refills: 0     ergocalciferol (vitamin D2) 1,250 mcg (50,000 unit) Capsule  Commonly known as: DRISDOL   50,000 Units, EVERY 7 DAYS  Refills: 0     FreeStyle Libre 14 Day Reader Misc  Generic drug: flash glucose scanning reader   USE TO CHECK GLUCOSE AS DIRECTED 4 TIMES DAILY  Refills: 0     * gabapentin  800 mg Tablet  Commonly known as: NEURONTIN    800 mg, 3 TIMES DAILY  Refills: 0     * gabapentin  100 mg Capsule  Commonly known as: NEURONTIN    100 mg, 3 TIMES DAILY  Refills: 0     Insulin  Syringe-Needle U-100 0.3 mL 29 gauge x 1/2 Syringe   Daily  Refills: 0     Nurtec ODT 75 mg Tablet, Rapid Dissolve  Generic drug: rimegepant   75 mg, EVERY 24 HOURS PRN  Refills: 0  ondansetron  4 mg Tablet, Rapid Dissolve  Commonly known as: ZOFRAN  ODT   4 mg, 4 TIMES DAILY PRN  Refills: 0     ramipriL 10 mg Capsule  Commonly known as:  ALTACE   10 mg, Oral, Daily  Refills: 0     simvastatin 10 mg Tablet  Commonly known as: ZOCOR   SMARTSIG:1 Tablet(s) By Mouth Every Evening  Refills: 0     Toujeo  Max U-300 SoloStar 300 unit/mL (3 mL) Insulin  Pen  Generic drug: insulin  glargine U-300 conc   25 Units, Subcutaneous, Daily  Qty: 3 mL  Refills: 0           * This list has 2 medication(s) that are the same as other medications prescribed for you. Read the directions carefully, and ask your doctor or other care provider to review them with you.                Follow up:   Peters, Jana, DO  [address]  [place] [address]  [phone]    In 3 days                     Clinical Impression   Left serous otitis media (Primary)   Viral upper respiratory illness   Headache         Current Discharge Medication List        START taking these medications    Details   amoxicillin -pot clavulanate (AUGMENTIN ) 875-125 mg Oral Tablet Take 1 Tablet by mouth Twice daily for 10 days  Qty: 20 Tablet, Refills: 0             R.A. Anysa Tacey, DO  Department of Emergency Medicine

## 2024-10-29 ENCOUNTER — Other Ambulatory Visit: Payer: Self-pay

## 2024-10-29 ENCOUNTER — Encounter (HOSPITAL_COMMUNITY): Payer: Self-pay

## 2024-10-29 ENCOUNTER — Emergency Department
Admission: EM | Admit: 2024-10-29 | Discharge: 2024-10-29 | Disposition: A | Discharge status: Home / Self Care | Payer: MEDICAID | Attending: Physician Assistant | Admitting: Physician Assistant

## 2024-10-29 ENCOUNTER — Emergency Department (HOSPITAL_COMMUNITY): Payer: MEDICAID

## 2024-10-29 DIAGNOSIS — F1910 Other psychoactive substance abuse, uncomplicated: Secondary | ICD-10-CM | POA: Insufficient documentation

## 2024-10-29 DIAGNOSIS — M25511 Pain in right shoulder: Secondary | ICD-10-CM | POA: Insufficient documentation

## 2024-10-29 DIAGNOSIS — M25519 Pain in unspecified shoulder: Secondary | ICD-10-CM

## 2024-10-29 MED ORDER — IBUPROFEN 800 MG TABLET: 800.0000 mg | ORAL_TABLET | Freq: Four times a day (QID) | ORAL | 0 refills | Status: AC | PRN

## 2024-10-29 MED ORDER — IBUPROFEN 800 MG TABLET
800.0000 mg | ORAL_TABLET | ORAL | Status: AC
  Administered 2024-10-29: 800 mg via ORAL

## 2024-10-29 MED ORDER — DICLOFENAC 1 % TOPICAL GEL: Freq: Four times a day (QID) | CUTANEOUS | 0 refills | Status: AC

## 2024-10-29 NOTE — ED Triage Notes (Signed)
 Right shoulder pain for months, worsening tonight. Patient reports she is unable to lift arm very far. Supposed to go to orthopedic doctor on 1/21. Was doing PT, but stated she stopped because it was making it worse.

## 2024-10-29 NOTE — Discharge Instructions (Signed)
 Use the sling as needed to help support the right shoulder.  Keep your appointment with Dr. Penne as scheduled.  You may take over-the-counter Tylenol  in addition to using the diclofenac  gel.

## 2024-10-29 NOTE — ED Nurses Note (Signed)
 Sling applied to the right arm. A full assessment was not completed by this nurse. The patient was seen and discharged from the waiting area. Patient discharged home with family.  AVS reviewed with patient/care giver.  A written copy of the AVS and discharge instructions was given to the patient/care giver.  Questions sufficiently answered as needed.  Patient/care giver encouraged to follow up with PCP as indicated.  In the event of an emergency, patient/care giver instructed to call 911 or go to the nearest emergency room.

## 2024-10-29 NOTE — ED Provider Notes (Signed)
 [facility] - Emergency Department  ED Primary Note  History of Present Illness   Gail Morris is a 59 y.o. female who had concerns including Shoulder Pain.  Patient presents with complaints of persistent right shoulder pain that is been ongoing for 7 months.  A recently worsened the last few days.  Range of motion in all directions increases pain She is unable to lift the right arm above the level of the shoulder.  She has tried to complete physical therapy with no improvement.  Her primary care provider has not recommended any further management.  Patient did take it upon herself to call the orthopedist.  She has an appointment with Dr. Penne on January 21st.  She reports the pain has significantly worsened today which prompted her visit.  She denies any new trauma.    Physical Exam   ED Triage Vitals [10/29/24 2109]   BP (Non-Invasive) 122/75   Heart Rate 78   Respiratory Rate 16   Temperature 36.2 C (97.1 F)   SpO2 98 %   Weight 50.8 kg (112 lb)   Height 1.575 m (5' 2)     Physical Exam  General: WDWN, appears stated age, alert in NAD. Cooperative throughout the exam.  Eyes:  no orbital edema or erythema, pupils equal, EOMI, no scleral icterus or conjunctival injection,  CV: good peripheral perfusion by inspection  Resp: no respiratory distress  MSK:  Right shoulder inspection unremarkable.  Skin: normal color, no lesions noted on the visible skin  Neuro: normal tone, no involuntary movements, GCS 4,5,6,   Psych: speech and affect are appropriate, thought processes intact, insight and judgement appropriate      Patient Data   Labs Ordered/Reviewed - No data to display  XR SHOULDER RIGHT   Final Result by Edi, Radresults In (01/16 2132)   No acute finding or significant change.                      Radiologist location ID: [id number]           Medical Decision Making        Medical Decision Making  Upon arrival patient was evaluated with a history and physical exam.  Differential  diagnosis includes shoulder arthritis, rotator cuff injury, tendinopathy, bony tumor, pathologic fracture.  X-ray of the right shoulder shows no evidence of pathologic fracture, tumor or arthritis.  Patient's symptoms are consistent with a rotator cuff injury.  Patient will be placed in the sling and conservative therapy initiated.  She is to follow up with the orthopedist as scheduled and consider MRI evaluation.    Amount and/or Complexity of Data Reviewed  Radiology: ordered. Decision-making details documented in ED Course.    Risk  Prescription drug management.      ED Course as of 10/29/24 2153   Fri Oct 29, 2024   2148 XR SHOULDER RIGHT  IMPRESSION:  No acute finding or significant change.                 Clinical Impression   Shoulder pain, unspecified chronicity, unspecified laterality - suspected rotator cuff injury (Primary)       Disposition: Discharged

## 2024-11-01 NOTE — ED Notes (Signed)
 [facility] - Emergency Department  Peer Recovery Coach Assessment    Initial Evaluation  Referred by:: Nurse  Location of Evaluation: Emergency Department  How many times in the last 12 months have you been to the ED?: 2  Have you ever served or are you currently serving in the Armed Forces?: No             Substance Use History  Patient current substance use status: Missed patient 1/16 @ 8:59pm. Patient self disclosed marijuana use within the past 12 months. Safe marijuana practices, harm reduction and long term use shared with patient.                                  Family, Social, Home & Safety History                                                 Employment            Engagement  Readiness ruler: 1  Summary of assessment priority areas: Substance abuse treatment    Brief Intervention  Discussed plan to reduce/quit substance use?: Yes  Discussed willingness to enter treatment?: No  Indicated patient's stage of change:: 1 - Precontemplation    Patient seen by Peer Recovery Coach and is a candidate for buprenorphine  administration in the ED. Patient needs assessment for bup treatment.: No    Plan  Was the patient referred to treatment?: No    Was patient referred to physician for Buprenorphine  Assessment in the ED?: No    Did patient receive Narcan in the ED?: No         Follow-up           Need for additional follow-up?: No       Hunter Free, Peer Recovery Coach 11/01/2024 10:50
